# Patient Record
Sex: Male | Born: 1950
Health system: Southern US, Community
[De-identification: ages and names within clinical notes are randomized; demographics above are authoritative.]

## PROBLEM LIST (undated history)

## (undated) DIAGNOSIS — M549 Dorsalgia, unspecified: Secondary | ICD-10-CM

## (undated) DIAGNOSIS — E119 Type 2 diabetes mellitus without complications: Secondary | ICD-10-CM

## (undated) DIAGNOSIS — H9190 Unspecified hearing loss, unspecified ear: Secondary | ICD-10-CM

## (undated) DIAGNOSIS — I252 Old myocardial infarction: Secondary | ICD-10-CM

## (undated) DIAGNOSIS — N419 Inflammatory disease of prostate, unspecified: Secondary | ICD-10-CM

## (undated) DIAGNOSIS — R972 Elevated prostate specific antigen [PSA]: Secondary | ICD-10-CM

## (undated) DIAGNOSIS — I4892 Unspecified atrial flutter: Secondary | ICD-10-CM

## (undated) DIAGNOSIS — M1990 Unspecified osteoarthritis, unspecified site: Secondary | ICD-10-CM

## (undated) DIAGNOSIS — N289 Disorder of kidney and ureter, unspecified: Secondary | ICD-10-CM

## (undated) DIAGNOSIS — K8020 Calculus of gallbladder without cholecystitis without obstruction: Secondary | ICD-10-CM

## (undated) DIAGNOSIS — I10 Essential (primary) hypertension: Secondary | ICD-10-CM

## (undated) DIAGNOSIS — I4891 Unspecified atrial fibrillation: Secondary | ICD-10-CM

## (undated) DIAGNOSIS — T4145XA Adverse effect of unspecified anesthetic, initial encounter: Secondary | ICD-10-CM

## (undated) DIAGNOSIS — E785 Hyperlipidemia, unspecified: Secondary | ICD-10-CM

## (undated) DIAGNOSIS — I2510 Atherosclerotic heart disease of native coronary artery without angina pectoris: Secondary | ICD-10-CM

## (undated) DIAGNOSIS — T8859XA Other complications of anesthesia, initial encounter: Secondary | ICD-10-CM

## (undated) HISTORY — DX: Unspecified osteoarthritis, unspecified site: M19.90

## (undated) HISTORY — DX: Unspecified atrial fibrillation: I48.91

## (undated) HISTORY — PX: OTHER SURGICAL HISTORY: SHX169

## (undated) HISTORY — DX: Inflammatory disease of prostate, unspecified: N41.9

## (undated) HISTORY — PX: TOOTH EXTRACTION: SHX859

## (undated) HISTORY — DX: Unspecified hearing loss, unspecified ear: H91.90

## (undated) HISTORY — DX: Disorder of kidney and ureter, unspecified: N28.9

## (undated) HISTORY — DX: Type 2 diabetes mellitus without complications: E11.9

## (undated) HISTORY — DX: Atherosclerotic heart disease of native coronary artery without angina pectoris: I25.10

## (undated) HISTORY — DX: Hyperlipidemia, unspecified: E78.5

## (undated) HISTORY — PX: EXTERNAL EAR SURGERY: SHX627

## (undated) HISTORY — PX: COLONOSCOPY: SHX5424

## (undated) HISTORY — DX: Elevated prostate specific antigen (PSA): R97.20

## (undated) HISTORY — DX: Dorsalgia, unspecified: M54.9

## (undated) HISTORY — DX: Essential (primary) hypertension: I10

## (undated) HISTORY — PX: VASECTOMY: SHX75

---

## 2005-02-06 ENCOUNTER — Ambulatory Visit: Payer: Self-pay | Admitting: Pulmonary Disease

## 2005-03-06 ENCOUNTER — Ambulatory Visit: Payer: Self-pay | Admitting: Pulmonary Disease

## 2005-04-28 ENCOUNTER — Ambulatory Visit: Payer: Self-pay | Admitting: Pulmonary Disease

## 2005-05-15 ENCOUNTER — Ambulatory Visit: Payer: Self-pay | Admitting: Pulmonary Disease

## 2005-05-29 ENCOUNTER — Ambulatory Visit: Payer: Self-pay | Admitting: Pulmonary Disease

## 2007-11-04 ENCOUNTER — Ambulatory Visit: Payer: Self-pay | Admitting: *Deleted

## 2007-11-05 ENCOUNTER — Inpatient Hospital Stay (HOSPITAL_COMMUNITY): Admission: EM | Admit: 2007-11-05 | Discharge: 2007-11-08 | Payer: Self-pay | Admitting: Emergency Medicine

## 2007-11-07 HISTORY — PX: CARDIAC CATHETERIZATION: SHX172

## 2007-11-24 ENCOUNTER — Ambulatory Visit: Payer: Self-pay | Admitting: Cardiology

## 2007-12-30 ENCOUNTER — Ambulatory Visit: Payer: Self-pay | Admitting: Cardiology

## 2007-12-30 LAB — CONVERTED CEMR LAB
Albumin: 4 g/dL (ref 3.5–5.2)
Total Bilirubin: 0.7 mg/dL (ref 0.3–1.2)
Total CHOL/HDL Ratio: 5.8
VLDL: 18 mg/dL (ref 0–40)

## 2008-02-17 ENCOUNTER — Ambulatory Visit: Payer: Self-pay | Admitting: Cardiology

## 2008-04-06 ENCOUNTER — Ambulatory Visit: Payer: Self-pay | Admitting: Cardiology

## 2008-04-06 LAB — CONVERTED CEMR LAB
ALT: 21 units/L (ref 0–53)
AST: 25 units/L (ref 0–37)
Albumin: 4.1 g/dL (ref 3.5–5.2)
Alkaline Phosphatase: 24 units/L — ABNORMAL LOW (ref 39–117)
Bilirubin, Direct: 0.1 mg/dL (ref 0.0–0.3)
Total Bilirubin: 0.9 mg/dL (ref 0.3–1.2)

## 2008-05-21 ENCOUNTER — Ambulatory Visit: Payer: Self-pay | Admitting: Cardiology

## 2008-05-25 ENCOUNTER — Ambulatory Visit: Payer: Self-pay | Admitting: Cardiology

## 2008-05-25 LAB — CONVERTED CEMR LAB
ALT: 21 units/L (ref 0–53)
AST: 27 units/L (ref 0–37)
Calcium: 9.6 mg/dL (ref 8.4–10.5)
LDL Cholesterol: 61 mg/dL (ref 0–99)
Potassium: 5.3 meq/L — ABNORMAL HIGH (ref 3.5–5.1)
Total CHOL/HDL Ratio: 4.2
Total Protein: 6.6 g/dL (ref 6.0–8.3)
Triglycerides: 48 mg/dL (ref 0–149)
VLDL: 10 mg/dL (ref 0–40)

## 2008-06-08 ENCOUNTER — Ambulatory Visit: Payer: Self-pay | Admitting: Cardiology

## 2008-06-08 LAB — CONVERTED CEMR LAB
BUN: 41 mg/dL — ABNORMAL HIGH (ref 6–23)
Calcium: 9.4 mg/dL (ref 8.4–10.5)
Chloride: 109 meq/L (ref 96–112)
Creatinine, Ser: 1.5 mg/dL (ref 0.4–1.5)
GFR calc Af Amer: 62 mL/min
GFR calc non Af Amer: 51 mL/min
Potassium: 5.1 meq/L (ref 3.5–5.1)
Sodium: 141 meq/L (ref 135–145)

## 2008-07-13 ENCOUNTER — Ambulatory Visit: Payer: Self-pay | Admitting: Cardiology

## 2008-09-28 DIAGNOSIS — I252 Old myocardial infarction: Secondary | ICD-10-CM

## 2008-09-28 HISTORY — DX: Old myocardial infarction: I25.2

## 2008-11-08 ENCOUNTER — Ambulatory Visit: Payer: Self-pay | Admitting: Cardiology

## 2008-11-12 ENCOUNTER — Encounter: Payer: Self-pay | Admitting: Cardiology

## 2008-11-12 LAB — CONVERTED CEMR LAB
ALT: 28 units/L (ref 0–53)
AST: 39 units/L — ABNORMAL HIGH (ref 0–37)
CO2: 27 meq/L (ref 19–32)
Calcium: 9.8 mg/dL (ref 8.4–10.5)
Cholesterol: 119 mg/dL (ref 0–200)
GFR calc Af Amer: 62 mL/min
Total CHOL/HDL Ratio: 4
VLDL: 19 mg/dL (ref 0–40)

## 2009-05-22 ENCOUNTER — Encounter (INDEPENDENT_AMBULATORY_CARE_PROVIDER_SITE_OTHER): Payer: Self-pay | Admitting: *Deleted

## 2009-08-16 DIAGNOSIS — E119 Type 2 diabetes mellitus without complications: Secondary | ICD-10-CM | POA: Insufficient documentation

## 2009-08-16 DIAGNOSIS — I2583 Coronary atherosclerosis due to lipid rich plaque: Secondary | ICD-10-CM

## 2009-08-16 DIAGNOSIS — N259 Disorder resulting from impaired renal tubular function, unspecified: Secondary | ICD-10-CM | POA: Insufficient documentation

## 2009-08-16 DIAGNOSIS — I2510 Atherosclerotic heart disease of native coronary artery without angina pectoris: Secondary | ICD-10-CM | POA: Insufficient documentation

## 2009-08-16 DIAGNOSIS — E785 Hyperlipidemia, unspecified: Secondary | ICD-10-CM | POA: Insufficient documentation

## 2009-08-16 DIAGNOSIS — I10 Essential (primary) hypertension: Secondary | ICD-10-CM | POA: Insufficient documentation

## 2009-08-19 ENCOUNTER — Ambulatory Visit: Payer: Self-pay | Admitting: Cardiology

## 2009-08-23 ENCOUNTER — Telehealth (INDEPENDENT_AMBULATORY_CARE_PROVIDER_SITE_OTHER): Payer: Self-pay | Admitting: *Deleted

## 2009-09-09 ENCOUNTER — Ambulatory Visit: Payer: Self-pay | Admitting: Cardiology

## 2009-09-09 ENCOUNTER — Encounter (INDEPENDENT_AMBULATORY_CARE_PROVIDER_SITE_OTHER): Payer: Self-pay | Admitting: *Deleted

## 2009-09-09 DIAGNOSIS — E875 Hyperkalemia: Secondary | ICD-10-CM | POA: Insufficient documentation

## 2009-09-11 LAB — CONVERTED CEMR LAB
Calcium: 9.1 mg/dL (ref 8.4–10.5)
GFR calc non Af Amer: 59.7 mL/min (ref >60)

## 2009-09-13 LAB — CONVERTED CEMR LAB
Bilirubin Urine: NEGATIVE
Creatinine,U: 118.8 mg/dL
Leukocytes, UA: NEGATIVE
Specific Gravity, Urine: >=1.03 (ref 1.000–1.030)
Total Protein, Urine: NEGATIVE mg/dL
Urine Glucose: NEGATIVE mg/dL
Urobilinogen, UA: 0.2 (ref 0.0–1.0)

## 2009-11-01 ENCOUNTER — Ambulatory Visit: Payer: Self-pay | Admitting: Internal Medicine

## 2009-11-01 DIAGNOSIS — K644 Residual hemorrhoidal skin tags: Secondary | ICD-10-CM | POA: Insufficient documentation

## 2009-11-26 ENCOUNTER — Ambulatory Visit: Payer: Self-pay | Admitting: Internal Medicine

## 2009-11-26 ENCOUNTER — Telehealth: Payer: Self-pay | Admitting: Internal Medicine

## 2009-11-26 ENCOUNTER — Telehealth (INDEPENDENT_AMBULATORY_CARE_PROVIDER_SITE_OTHER): Payer: Self-pay | Admitting: *Deleted

## 2010-04-21 ENCOUNTER — Telehealth: Payer: Self-pay | Admitting: Cardiology

## 2010-04-21 DIAGNOSIS — R002 Palpitations: Secondary | ICD-10-CM | POA: Insufficient documentation

## 2010-04-23 ENCOUNTER — Ambulatory Visit: Payer: Self-pay | Admitting: Cardiology

## 2010-04-28 ENCOUNTER — Emergency Department (HOSPITAL_COMMUNITY): Admission: EM | Admit: 2010-04-28 | Discharge: 2010-04-29 | Payer: Self-pay | Admitting: Emergency Medicine

## 2010-04-28 ENCOUNTER — Telehealth (INDEPENDENT_AMBULATORY_CARE_PROVIDER_SITE_OTHER): Payer: Self-pay | Admitting: *Deleted

## 2010-04-28 ENCOUNTER — Telehealth: Payer: Self-pay | Admitting: Nurse Practitioner

## 2010-04-29 ENCOUNTER — Ambulatory Visit: Payer: Self-pay | Admitting: Cardiology

## 2010-04-29 DIAGNOSIS — I480 Paroxysmal atrial fibrillation: Secondary | ICD-10-CM | POA: Insufficient documentation

## 2010-04-30 LAB — CONVERTED CEMR LAB
Albumin: 4.6 g/dL (ref 3.5–5.2)
Cholesterol: 119 mg/dL (ref 0–200)
Total CHOL/HDL Ratio: 5

## 2010-05-02 ENCOUNTER — Inpatient Hospital Stay (HOSPITAL_COMMUNITY): Admission: AD | Admit: 2010-05-02 | Discharge: 2010-05-06 | Payer: Self-pay | Admitting: Internal Medicine

## 2010-05-02 ENCOUNTER — Telehealth: Payer: Self-pay | Admitting: Cardiology

## 2010-05-02 ENCOUNTER — Encounter: Payer: Self-pay | Admitting: Cardiology

## 2010-05-02 ENCOUNTER — Ambulatory Visit: Payer: Self-pay | Admitting: Internal Medicine

## 2010-05-07 ENCOUNTER — Telehealth (INDEPENDENT_AMBULATORY_CARE_PROVIDER_SITE_OTHER): Payer: Self-pay | Admitting: *Deleted

## 2010-06-05 ENCOUNTER — Ambulatory Visit: Payer: Self-pay | Admitting: Internal Medicine

## 2010-07-21 ENCOUNTER — Ambulatory Visit: Payer: Self-pay | Admitting: Cardiology

## 2010-08-27 ENCOUNTER — Encounter: Payer: Self-pay | Admitting: Gastroenterology

## 2010-10-01 ENCOUNTER — Encounter: Payer: Self-pay | Admitting: Internal Medicine

## 2010-10-01 ENCOUNTER — Ambulatory Visit
Admission: RE | Admit: 2010-10-01 | Discharge: 2010-10-01 | Payer: Self-pay | Source: Home / Self Care | Attending: Internal Medicine | Admitting: Internal Medicine

## 2010-10-26 LAB — CONVERTED CEMR LAB
ALT: 31 units/L (ref 0–53)
AST: 42 units/L — ABNORMAL HIGH (ref 0–37)
Albumin: 4.5 g/dL (ref 3.5–5.2)
Bilirubin, Direct: 0.1 mg/dL (ref 0.0–0.3)
CO2: 27 meq/L (ref 19–32)
Chloride: 108 meq/L (ref 96–112)
Cholesterol: 114 mg/dL (ref 0–200)
Creatinine, Ser: 1.5 mg/dL (ref 0.4–1.5)
GFR calc non Af Amer: 50.63 mL/min (ref >60)
HDL: 20.5 mg/dL — ABNORMAL LOW (ref >39.00)
Potassium: 5.2 meq/L — ABNORMAL HIGH (ref 3.5–5.1)
Sodium: 142 meq/L (ref 135–145)
Total Bilirubin: 1 mg/dL (ref 0.3–1.2)
Total CHOL/HDL Ratio: 6
Triglycerides: 84 mg/dL (ref 0.0–149.0)

## 2010-10-30 NOTE — Progress Notes (Signed)
   Phone Note Outgoing Call   Call placed by: Deliah Goody, RN,  April 28, 2010 1:45 PM Summary of Call: event monitor reports atrial fib/flutter. spoke with pt wife, pt will come to the office tomorrow to be seen. he understands to go to the ER if irregular heart beat does not go away. Initial call taken by: Deliah Goody, RN,  April 28, 2010 1:47 PM

## 2010-10-30 NOTE — Letter (Signed)
Summary: Diabetic Instructions   Gastroenterology  [address] [place], [place] [address]   Phone: [phone]  Fax: [phone]    Joshua [name] [birth year] MRN: [id number]   x   ORAL DIABETIC MEDICATION INSTRUCTIONS  The day before your procedure:   Take your diabetic pill as you do normally  The day of your procedure:   Do not take your diabetic pill    We will check your blood sugar levels during the admission process and again in Recovery before discharging you home  ________________________________________________________________________

## 2010-10-30 NOTE — Progress Notes (Signed)
   Certification Of Physician Or Practitioner [facility] ) recieved sent to [facility] for completion. [facility] [place]  May 07, 2010 8:27 AM

## 2010-10-30 NOTE — Assessment & Plan Note (Signed)
Summary: SCREENING colonoscopy on Plavix / hemorrhoids   History of Present Illness Visit Type: consult Primary GI MD: Yancey Flemings MD Requesting Provider: Jarome Matin, MD Chief Complaint: hemorrhoids, screening colonoscopy on Plavix History of Present Illness:   60 year old white male with multiple medical problems including morbid obesity, hypertension, hyperlipidemia, diabetes mellitus, coronary artery disease with coronary artery stents, the need for chronic aspirin/Plavix therapy, and renal insufficiency. He presents today regarding health maintenance and screening colonoscopy. Also, concerns about hemorrhoids. No prior GI history or GI problems. No prior GI procedures. His GI review of systems is only remarkable for anal discomfort and occasional bleeding which he attributes to a hemorrhoid. He has tried over-the-counter remedies such as Tucks pads without improvement. Marland Kitchen His bowel habits are normal.. No family history of colon cancer. His multiple significant chronic medical problems are stable. He does have drug-eluting stents placed in his heart arteries about 2 years ago. I reviewed Dr. Ludwig Clarks recent office note from November. Looks like they're planning on discontinuing Plavix later this year.. Review of outside laboratories from December 2010 reveals normal metabolic panel.   GI Review of Systems      Denies abdominal pain, acid reflux, belching, bloating, chest pain, dysphagia with liquids, dysphagia with solids, heartburn, loss of appetite, nausea, vomiting, vomiting blood, weight loss, and  weight gain.      Reports hemorrhoids, rectal bleeding, and  rectal pain.     Denies anal fissure, black tarry stools, change in bowel habit, constipation, diarrhea, diverticulosis, fecal incontinence, heme positive stool, irritable bowel syndrome, jaundice, light color stool, and  liver problems.    Current Medications (verified): 1)  Atenolol 50 Mg Tabs (Atenolol) .Marland Kitchen.. 1 Tab By Mouth  Once Daily 2)  Quinapril Hcl 40 Mg Tabs (Quinapril Hcl) .Marland Kitchen.. 1 Tab By Mouth Once Daily 3)  Plavix 75 Mg Tabs (Clopidogrel Bisulfate) .Marland Kitchen.. 1 Tab By Mouth Once Daily 4)  Actos 45 Mg Tabs (Pioglitazone Hcl) .Marland Kitchen.. 1 Tab By Mouth Once Daily 5)  Fenofibrate Micronized 200 Mg Caps (Fenofibrate Micronized) .Marland Kitchen.. 1 Tab By Mouth Once Daily 6)  Lipitor 40 Mg Tabs (Atorvastatin Calcium) .Marland Kitchen.. 1 Tab By Mouth Once Daily 7)  Aspirin Adult Low Strength 81 Mg Tbec (Aspirin) .Marland Kitchen.. 1 Tab By Mouth Qd 8)  Centrum Silver  Tabs (Multiple Vitamins-Minerals) .Marland Kitchen.. 1 Tab By Mouth Once Daily 9)  Nitrostat 0.4 Mg Subl (Nitroglycerin) .Marland Kitchen.. 1 Tablet Under Tongue At Onset of Chest Pain; You May Repeat Every 5 Minutes For Up To 3 Doses.  Allergies (verified): No Known Drug Allergies  Past History:  Past Medical History: Reviewed history from 08/16/2009 and no changes required. Current Problems:  CAD (ICD-414.00) HYPERLIPIDEMIA (ICD-272.4) HYPERTENSION (ICD-401.9) DM (ICD-250.00) RENAL INSUFFICIENCY (ICD-588.9)  Past Surgical History: Reviewed history from 08/16/2009 and no changes required. Successful two-vessel percutaneous coronary intervention of  the left mid left circumflex and proximal left anterior descending  artery using Cypher drug-eluting stents in each vessel.  [name]. [name], MD   Electronically Signed  MDC/MEDQ  D:  11/07/2007  T:  11/08/2007  Job:  [id number]   Family History:  The patient's Mother is deceased at age 17 with hypertension,   gout and Alzheimer's.  His father died of CHF in his early 73s, but had  multiple MIs prior to this possibly as early as his 49s.  He had a  brother, who is deceased, with substance abuse, heart problems and  cancer.  No FH of Colon Cancer:  Social History:  The patient lives in [place] with his wife.  He is   Retail banker.  He is married with two children.  No smoking   history.  No alcohol history.  No drugs.  No herbal medication.   Daily Caffeine Use x 2     Review of Systems       entire non-GI review of systems reviewed and reported as negative  Vital Signs:  Patient profile:   60 year old male Height:      68 inches Weight:      263 pounds BMI:     40.13 Pulse rate:   68 / minute Pulse rhythm:   regular BP sitting:   120 / 84  (left arm) Cuff size:   regular  Vitals Entered By: Francee Piccolo CMA Duncan Dull) (November 01, 2009 10:09 AM)  Physical Exam  General:  Well developed, well nourished, no acute distress. Head:  Normocephalic and atraumatic.Marland Kitchen Hypervascularity of the cheeks bilaterally Eyes:  PERRLA, no icterus. Nose:  No deformity, discharge,  or lesions. Mouth:  No deformity or lesions, dentition normal. Neck:  Supple; no masses or thyromegaly. Lungs:  Clear throughout to auscultation. Heart:  Regular rate and rhythm; no murmurs, rubs,  or bruits. Abdomen:  Soft, obese,nontender and nondistended. No masses, hepatosplenomegaly or hernias noted. Normal bowel sounds. Rectal:  deferred until colonoscopy Msk:  Symmetrical with no gross deformities. Normal posture. Pulses:  Normal pulses noted. Extremities:  No clubbing, cyanosis, edema or deformities noted. Neurologic:  Alert and  oriented x4;  grossly normal neurologically. Skin:  Intact without significant lesions or rashes. Psych:  Alert and cooperative. Normal mood and affect.   Impression & Recommendations:  Problem # 1:  HEMORRHOIDS-EXTERNAL (ICD-455.3) rectal discomfort and minor intermittent bleeding presumably due to external hemorrhoid. We deferred evaluation until his colonoscopy.. Thereafter more definitive treatment recom.mendations.  Problem # 2:  SCREENING COLORECTAL-CANCER (ICD-V76.51) the patient is an appropriate candidate for screening colonoscopy without contraindication. He is however HIGH RISK due to his multiple comorbidities. As well, the need to address the issue of Plavix therapy. I discussed with him the nature of colonoscopy as well as its  risks, benefits, and alternatives in detail. We also discussed the pros and cons of discontinuing Plavix therapy and the implications. It was his preference to come off of Plavix therapy if okay with Dr. Jens Som. We would take him off of Plavix 5 days prior to the examination and keep him off of the drug as much as 2 weeks postprocedure depending upon if significant therapy needed. However, he would remain on aspirin daily. I sent correspondence to Dr. Jens Som for his review on this matter. Additionally, the patient was prescribed Movi prep. He was instructed on its use  Problem # 3:  CAD (ICD-414.00) CAD with drug-eluting stents on aspirin and Plavix therapy. Impact on the procedure as outlined above.  Patient Instructions: 1)  Colonoscopy [facility] 11/26/09 10:30 am 2)  Colonoscopy brochure given.  3)  Movi prep instructions given to patient. 4)  Movi prep Rx. sent to pharmacy. 5)  Hold Plavix x 5 days   Letter sent to Dr. Jens Som for ok. Will Contact patient with response. 6)  Stay on Aspirin. 7)  The medication list was reviewed and reconciled.  All changed / newly prescribed medications were explained.  A complete medication list was provided to the patient / caregiver. 8)  Copy: Dr. Jarome Matin, Dr. Olga Millers Prescriptions: MOVIPREP 100 GM  SOLR (PEG-KCL-NACL-NASULF-NA ASC-C) As per  prep instructions.  #1 x 0   Entered by:   Milford Cage NCMA   Authorized by:   Hilarie Fredrickson MD   Signed by:   Milford Cage NCMA on 11/01/2009   Method used:   [facility] [phone][facility] (retail[facility]       [address]       [place], [place]  [address]       Ph: [phone] or [phone]       Fax: [phone]   RxID:   [id number]

## 2010-10-30 NOTE — Miscellaneous (Signed)
Summary: Orders Update  Clinical Lists Changes  Orders: Added new Test order of TLB-Hepatic/Liver Function Pnl (80076-HEPATIC) - Signed Added new Test order of TLB-Lipid Panel (80061-LIPID) - Signed 

## 2010-10-30 NOTE — Letter (Signed)
Summary: [facility]  [facility]   Imported By: Lester Gallaway 09/11/2010 11:53:32  _____________________________________________________________________  External Attachment:    Type:   Image     Comment:   External Document

## 2010-10-30 NOTE — Progress Notes (Signed)
   Phone Note Call from Patient   Summary of Call: re-sent prescription for anamantle cream and notified patient to pick up at [facility] Initial call taken by: Rylan Bernard S. Meko Masterson, RN

## 2010-10-30 NOTE — Progress Notes (Signed)
Summary: Cardiology Phone Note - Rapid AF   Phone Note Other Incoming   Caller: [employer] Summary of Call: received call from [employer] technician that Joshua [name] has been in rapid a. fib for past 1+ hrs.  technician reports having spoken to pts wife who advised that they were calling ems.  i called pts home and there was no answer or opportunity to leave a message. Initial call taken by: Creig Hines, ANP-BC,  April 28, 2010 6:50 PM

## 2010-10-30 NOTE — Letter (Signed)
Summary: [facility] Instructions  [facility]  [address] [place], [place] [address]   Phone: [phone]  Fax: [phone]       Joshua [name]    05-22-1951    MRN: [id number]        Procedure Day /Date:TUESDAY  11/26/09     Arrival Time:9:30 AM     Procedure Time:10:30 AM     Location of Procedure:                    X  [facility] (4th Floor)                        PREPARATION FOR COLONOSCOPY WITH MOVIPREP   Starting 5 days prior to your procedure 11/22/09 do not eat nuts, seeds, popcorn, corn, beans, peas,  salads, or any raw vegetables.  Do not take any fiber supplements (e.g. Metamucil, Citrucel, and Benefiber).  THE DAY BEFORE YOUR PROCEDURE         DATE: 11/25/09  GMW:NUUVOZ  1.  Drink clear liquids the entire day-NO SOLID FOOD  2.  Do not drink anything colored red or purple.  Avoid juices with pulp.  No orange juice.  3.  Drink at least 64 oz. (8 glasses) of fluid/clear liquids during the day to prevent dehydration and help the prep work efficiently.  CLEAR LIQUIDS INCLUDE: Water Jello Ice Popsicles Tea (sugar ok, no milk/cream) Powdered fruit flavored drinks Coffee (sugar ok, no milk/cream) Gatorade Juice: apple, white grape, white cranberry  Lemonade Clear bullion, consomm, broth Carbonated beverages (any kind) Strained chicken noodle soup Hard Candy                             4.  In the morning, mix first dose of MoviPrep solution:    Empty 1 Pouch A and 1 Pouch B into the disposable container    Add lukewarm drinking water to the top line of the container. Mix to dissolve    Refrigerate (mixed solution should be used within 24 hrs)  5.  Begin drinking the prep at 5:00 p.m. The MoviPrep container is divided by 4 marks.   Every 15 minutes drink the solution down to the next mark (approximately 8 oz) until the full liter is complete.   6.  Follow completed prep with 16 oz of clear liquid of your choice (Nothing red or  purple).  Continue to drink clear liquids until bedtime.  7.  Before going to bed, mix second dose of MoviPrep solution:    Empty 1 Pouch A and 1 Pouch B into the disposable container    Add lukewarm drinking water to the top line of the container. Mix to dissolve    Refrigerate  THE DAY OF YOUR PROCEDURE      DATE: 11/26/09 DAY: Jake Shark  Beginning at5:30 a.m. (5 hours before procedure):         1. Every 15 minutes, drink the solution down to the next mark (approx 8 oz) until the full liter is complete.  2. Follow completed prep with 16 oz. of clear liquid of your choice.    3. You may drink clear liquids until8:30 AM (2 HOURS BEFORE PROCEDURE).   MEDICATION INSTRUCTIONS  Unless otherwise instructed, you should take regular prescription medications with a small sip of water   as early as possible the morning of your procedure.  Diabetic patients -  see separate instructions.  Stop taking Plavix   11/22/09  (5days before procedure).            OTHER INSTRUCTIONS  You will need a responsible adult at least 62 years of age to accompany you and drive you home.   This person must remain in the waiting room during your procedure.  Wear loose fitting clothing that is easily removed.  Leave jewelry and other valuables at home.  However, you may wish to bring a book to read or  an iPod/MP3 player to listen to music as you wait for your procedure to start.  Remove all body piercing jewelry and leave at home.  Total time from sign-in until discharge is approximately 2-3 hours.  You should go home directly after your procedure and rest.  You can resume normal activities the  day after your procedure.  The day of your procedure you should not:   Drive   Make legal decisions   Operate machinery   Drink alcohol   Return to work  You will receive specific instructions about eating, activities and medications before you leave.    The above instructions have been  reviewed and explained to me by   _______________________    I fully understand and can verbalize these instructions _____________________________ Date _________

## 2010-10-30 NOTE — Progress Notes (Signed)
Summary: Medication  Medications Added LIDOCAINE-HYDROCORTISONE ACE 3-0.5 % CREA (LIDOCAINE-HYDROCORTISONE ACE) 2-3 times daily as needed; dispense large quantity with 6 refills      Allergies Added: NKDA Phone Note Call from Patient Call back at Home Phone [phone]   Caller: Patient Call For: Dr. Marina Goodell Reason for Call: Talk to Nurse Summary of Call: Pt went to pharmacy and the rx for Anamantle Cream was not at the pharmacy Initial call taken by: Karna Christmas,  November 26, 2009 3:14 PM    New/Updated Medications: LIDOCAINE-HYDROCORTISONE ACE 3-0.5 % CREA (LIDOCAINE-HYDROCORTISONE ACE) 2-3 times daily as needed; dispense large quantity with 6 refills Prescriptions: LIDOCAINE-HYDROCORTISONE ACE 3-0.5 % CREA (LIDOCAINE-HYDROCORTISONE ACE) 2-3 times daily as needed; dispense large quantity with 6 refills  #8 oz x 6   Entered by:   Quincy Carnes RN   Authorized by:   Hilarie Fredrickson MD   Signed by:   Quincy Carnes RN on 11/26/2009   Method used:   [facility] [phone]* (retail)       [address]       [place], [place]  [address]       Ph: [phone] or [phone]       Fax: [phone]   RxID:   [id number]

## 2010-10-30 NOTE — Procedures (Signed)
Summary: Summary Report  Summary Report   Imported By: Erle Crocker 07/09/2010 14:40:13  _____________________________________________________________________  External Attachment:    Type:   Image     Comment:   External Document

## 2010-10-30 NOTE — Progress Notes (Signed)
Summary: Irregular heart beats   Phone Note Call from Patient Call back at [phone]   Caller: Patient Summary of Call: Pt having irregular heart beats Initial call taken by: Judie Grieve,  April 21, 2010 9:09 AM  Follow-up for Phone Call        spoke with pt, he has had two episodes of his heart racing at rest. these episodes last less than one minute, he breaks out in a cold sweat and has an aching in his jaw. he denies any other chest pain or other symptoms. he did not have this type of symptoms with his stent. he works outside and walks alot and has no symptoms with exerction but he states his heart is not beating regular on a daily basis, feels like it is skipping, but has only had two episode of racing. he also reports if it occurs while lying down if he sits up it stops. he feel fine at present. will foward for dr Jens Som review Deliah Goody, RN  April 21, 2010 11:24 AM    Additional Follow-up for Phone Call Additional follow up Details #1::        schedule cardionet and f/u ov Ferman Hamming, MD, [facility]  April 21, 2010 2:45 PM  pt aware, monitor ordered and office visit scheduled Deliah Goody, RN  April 21, 2010 3:18 PM   New Problems: PALPITATIONS (ICD-785.1)   New Problems: PALPITATIONS (ICD-785.1)

## 2010-10-30 NOTE — Letter (Signed)
Summary: Anticoagulation Modification Letter  [facility]  [address] [place], [place] [address]   Phone: [phone]  Fax: [phone]         November 01, 2009  Re:    Joshua [name] DOB:    02-23-1951 MRN:    [id number]    Dear Dr. Jens Som:  We have scheduled the above patient for a colonoscopy procedure. Our records show that  he is on anticoagulation therapy. Please advise as to how long the patient may come off their therapy of Plavix prior to the scheduled procedure(s) on 11/26/09.   Please fax back/or route the completed form to [facility] at [phone].  Thank you for your help with this matter.  Sincerely,    Wilhemina Bonito. Marina Goodell, MD  Physician Recommendation:  Hold Plavix 5days prior ________________  Other:     Stay on Aspirin  Appended Document: Anticoagulation Modification Letter Faxed to Dr. Jens Som.  Appended Document: Anticoagulation Modification Letter per dr Jens Som, pt can hold plavix 5 days prior to procedure. we would like the pt to continue on aspirin.  Appended Document: Anticoagulation Modification Letter Called patient and notified wife that it was ok to hold Plavix x 5 days and Stay on ASA per Dr. Jens Som.

## 2010-10-30 NOTE — Assessment & Plan Note (Signed)
Summary: rov/new atrial fib/flutter/dm  Medications Added ATENOLOL 50 MG TABS (ATENOLOL) 1 tab by mouth two times a day WARFARIN SODIUM 5 MG TABS (WARFARIN SODIUM) Use as directed by Anticoagulation Clinic PRADAXA 150 MG CAPS (DABIGATRAN ETEXILATE MESYLATE) ONE TABLET by mouth two times a day ACTOS 45 MG TABS (PIOGLITAZONE HCL) 1 tab by mouth once monday, wednesday and friday      Allergies Added: NKDA  History of Present Illness: [name] is a pleasant gentleman who has a history of diabetes, hypertension, hyperlipidemia, and coronary artery disease.  In February 2009, he had PCI of his circumflex and proximal LAD using Cypher drug- eluting stents. I last saw him in November of 2010. He recently contacted the office with palpitations. A monitor was placed and reveals paroxysmal atrial fibrillation.Seen in the emergency room last evening. One set of cardiac markers were normal. Hemoglobin and renal function were normal. Chest x-ray showed no acute disease. Patient states that his symptoms have occurred over the past 2 weeks. He will feel a sudden onset of palpitations. There is no associated shortness of breath, chest pain or presyncope. They occur more frequently when lying flat and improved or break when sitting up. He otherwise has not had dyspnea on exertion, or exertional chest pain.  Current Medications (verified): 1)  Atenolol 50 Mg Tabs (Atenolol) .Marland Kitchen.. 1 Tab By Mouth Once Daily 2)  Quinapril Hcl 40 Mg Tabs (Quinapril Hcl) .Marland Kitchen.. 1 Tab By Mouth Once Daily 3)  Plavix 75 Mg Tabs (Clopidogrel Bisulfate) .Marland Kitchen.. 1 Tab By Mouth Once Daily 4)  Actos 45 Mg Tabs (Pioglitazone Hcl) .Marland Kitchen.. 1 Tab By Mouth Once Monday, Wednesday and Friday 5)  Fenofibrate Micronized 200 Mg Caps (Fenofibrate Micronized) .Marland Kitchen.. 1 Tab By Mouth Once Daily 6)  Lipitor 40 Mg Tabs (Atorvastatin Calcium) .Marland Kitchen.. 1 Tab By Mouth Once Daily 7)  Aspirin Adult Low Strength 81 Mg Tbec (Aspirin) .Marland Kitchen.. 1 Tab By Mouth Qd 8)  Nitrostat 0.4 Mg  Subl (Nitroglycerin) .Marland Kitchen.. 1 Tablet Under Tongue At Onset of Chest Pain; You May Repeat Every 5 Minutes For Up To 3 Doses.  Allergies (verified): No Known Drug Allergies  Past History:  Past Medical History: CAD (ICD-414.00) HYPERLIPIDEMIA (ICD-272.4) HYPERTENSION (ICD-401.9) DM (ICD-250.00) RENAL INSUFFICIENCY (ICD-588.9)  Past Surgical History: Reviewed history from 08/16/2009 and no changes required. Successful two-vessel percutaneous coronary intervention of  the left mid left circumflex and proximal left anterior descending  artery using Cypher drug-eluting stents in each vessel.  [name]. [name], MD   Electronically Signed  MDC/MEDQ  D:  11/07/2007  T:  11/08/2007  Job:  [id number]   Social History: Reviewed history from 11/01/2009 and no changes required.  The patient lives in [place] with his wife.  He is   Retail banker.  He is married with two children.  No smoking   history.  No alcohol history.  No drugs.  No herbal medication.   Daily Caffeine Use x 2   Review of Systems       no fevers or chills, productive cough, hemoptysis, dysphasia, odynophagia, melena, hematochezia, dysuria, hematuria, rash, seizure activity, orthopnea, PND, pedal edema, claudication. Remaining systems are negative.   Vital Signs:  Patient profile:   60 year old male Weight:      258 pounds Pulse rate:   60 / minute Pulse rhythm:   irregular BP sitting:   128 / 76  (left arm) Cuff size:   large  Vitals Entered By: Deliah Goody, RN (April 29, 2010 7:35 AM)  Physical Exam  General:  Well-developed well-nourished in no acute distress.  Skin is warm and dry.  HEENT is normal.  Neck is supple. No thyromegaly.  Chest is clear to auscultation with normal expansion.  Cardiovascular exam is regular rate and rhythm.  Abdominal exam nontender or distended. No masses palpated. Extremities show no edema. neuro grossly intact    Impression & Recommendations:  Problem # 1:  ATRIAL  FIBRILLATION (ICD-427.31) Patient has newly diagnosed atrial fibrillation. He apparently had a TSH drawn in the emergency room last evening. I will await those results. Scheduled echocardiogram. Increase atenolol to 50 mg p.o. b.i.d. If he continues to have episodes despite above will add an antiarrhythmic. Embolic risk factors of diabetes, hypertension and coronary disease. Begin pradaxa 150 mg p.o. b.i.d. Note recent creatinine 1.34 with GFR of 54. His updated medication list for this problem includes:    Atenolol 50 Mg Tabs (Atenolol) .Marland Kitchen... 1 tab by mouth two times a day    Aspirin Adult Low Strength 81 Mg Tbec (Aspirin) .Marland Kitchen... 1 tab by mouth qd  Orders: Echocardiogram (Echo)  Problem # 2:  CAD (ICD-414.00) Continue aspirin. Given that we are beginning pradaxa we will discontinue Plavix. Continue statin and beta blocker. His updated medication list for this problem includes:    Atenolol 50 Mg Tabs (Atenolol) .Marland Kitchen... 1 tab by mouth two times a day    Quinapril Hcl 40 Mg Tabs (Quinapril hcl) .Marland Kitchen... 1 tab by mouth once daily    Aspirin Adult Low Strength 81 Mg Tbec (Aspirin) .Marland Kitchen... 1 tab by mouth qd    Nitrostat 0.4 Mg Subl (Nitroglycerin) .Marland Kitchen... 1 tablet under tongue at onset of chest pain; you may repeat every 5 minutes for up to 3 doses.  Problem # 3:  HYPERLIPIDEMIA (ICD-272.4)  Continue statin. Check lipids and liver. His updated medication list for this problem includes:    Fenofibrate Micronized 200 Mg Caps (Fenofibrate micronized) .Marland Kitchen... 1 tab by mouth once daily    Lipitor 40 Mg Tabs (Atorvastatin calcium) .Marland Kitchen... 1 tab by mouth once daily  Orders: TLB-Lipid Panel (80061-LIPID) TLB-Hepatic/Liver Function Pnl (80076-HEPATIC)  Problem # 4:  HYPERTENSION (ICD-401.9)  Blood pressure controlled on present medications. Will continue. His updated medication list for this problem includes:    Atenolol 50 Mg Tabs (Atenolol) .Marland Kitchen... 1 tab by mouth two times a day    Quinapril Hcl 40 Mg Tabs  (Quinapril hcl) .Marland Kitchen... 1 tab by mouth once daily    Aspirin Adult Low Strength 81 Mg Tbec (Aspirin) .Marland Kitchen... 1 tab by mouth qd  His updated medication list for this problem includes:    Atenolol 50 Mg Tabs (Atenolol) .Marland Kitchen... 1 tab by mouth two times a day    Quinapril Hcl 40 Mg Tabs (Quinapril hcl) .Marland Kitchen... 1 tab by mouth once daily    Aspirin Adult Low Strength 81 Mg Tbec (Aspirin) .Marland Kitchen... 1 tab by mouth qd  Problem # 5:  DM (ICD-250.00)  His updated medication list for this problem includes:    Quinapril Hcl 40 Mg Tabs (Quinapril hcl) .Marland Kitchen... 1 tab by mouth once daily    Actos 45 Mg Tabs (Pioglitazone hcl) .Marland Kitchen... 1 tab by mouth once monday, wednesday and friday    Aspirin Adult Low Strength 81 Mg Tbec (Aspirin) .Marland Kitchen... 1 tab by mouth qd  His updated medication list for this problem includes:    Quinapril Hcl 40 Mg Tabs (Quinapril hcl) .Marland Kitchen... 1 tab by mouth once daily  Actos 45 Mg Tabs (Pioglitazone hcl) .Marland Kitchen... 1 tab by mouth once monday, wednesday and friday    Aspirin Adult Low Strength 81 Mg Tbec (Aspirin) .Marland Kitchen... 1 tab by mouth qd  Patient Instructions: 1)  Your physician recommends that you schedule a follow-up appointment in: 05-19-10 as scheduled 2)  Your physician has recommended you make the following change in your medication: STOP PLAVIX 3)  INCREASE ATENOLOL 50MG  ONE TABLET TWICE DAILY 4)  START PRADAXA 150MG  ONE TABLET TWICE DAILY 5)  Your physician has requested that you have an echocardiogram.  Echocardiography is a painless test that uses sound waves to create images of your heart. It provides your doctor with information about the size and shape of your heart and how well your heart's chambers and valves are working.  This procedure takes approximately one hour. There are no restrictions for this procedure. Prescriptions: PRADAXA 150 MG CAPS (DABIGATRAN ETEXILATE MESYLATE) ONE TABLET by mouth two times a day  #60 x 12   Entered by:   Deliah Goody, RN   Authorized by:   Ferman Hamming, MD, [facility]   Signed by:   Deliah Goody, RN on 04/29/2010   Method used:   Electronically to        [facility] [phone]* (retail)       [address]       [place], [place]  [address]       Ph: [phone] or [phone]       Fax: [phone]   RxID:   [id number] NITROSTAT 0.4 MG SUBL (NITROGLYCERIN) 1 tablet under tongue at onset of chest pain; you may repeat every 5 minutes for up to 3 doses.  #25 Tablet x 11   Entered by:   Deliah Goody, RN   Authorized by:   Ferman Hamming, MD, [facility]   Signed by:   Deliah Goody, RN on 04/29/2010   Method used:   [facility] to        [facility] [phone]* (retail)       [address]       [place], [place]  [address]       Ph: [phone] or [phone]       Fax: [phone]   RxID:   [id number] ATENOLOL 50 MG TABS (ATENOLOL) 1 tab by mouth two times a day  #60 x 12   Entered by:   Deliah Goody, RN   Authorized by:   Ferman Hamming, MD, [facility]   Signed by:   Deliah Goody, RN on 04/29/2010   Method used:   [facility]ronically to        [facility] [phone]* (retail)       [address]       [place], [place]  [address]       Ph: [phone] or [phone]       Fax: [phone]   RxID:   [id number] WARFARIN SODIUM 5 MG TABS (WARFARIN SODIUM) Use as directed by Anticoagulation Clinic  #30 x 12   Entered by:   Deliah Goody, RN   Authorized by:   Ferman Hamming, MD, [facility]   Signed by:   Deliah Goody, RN on 04/29/2010   Method used:   Electronically to        [facility] #  [phone] (retail)       [address]       [place], [place]  [address]       Ph: [phone] or [phone]       Fax: [phone]   RxID:   [id number]

## 2010-10-30 NOTE — Procedures (Signed)
Summary: HOLD/[facility]  HOLD/[facility]   Imported By: Lester West Crossett 11/15/2009 09:04:23  _____________________________________________________________________  External Attachment:    Type:   Image     Comment:   External Document

## 2010-10-30 NOTE — Assessment & Plan Note (Signed)
Summary: eph/ appt is 12:15 / s/p ablation/ gd      Allergies Added: NKDA  History of Present Illness: Mr. Eudy returns today for followup.  He has a h/o DM, HTN, CAD, and atrial fib.  He was found to have recurrent SVT and would develop atrial fib after he had SVT.  He underwent EPS/RFA of AVNRT several weeks ago and returns today for followup.  He denies c/p, sob or palpitations.  He notes that his resting heart rate is slow but he denies syncope.  Current Medications (verified): 1)  Atenolol 50 Mg Tabs (Atenolol) .Marland Kitchen.. 1 Tab By Mouth Two Times A Day 2)  Quinapril Hcl 40 Mg Tabs (Quinapril Hcl) .Marland Kitchen.. 1 Tab By Mouth Once Daily 3)  Pradaxa 150 Mg Caps (Dabigatran Etexilate Mesylate) .... One Tablet By Mouth Two Times A Day 4)  Actos 45 Mg Tabs (Pioglitazone Hcl) .Marland Kitchen.. 1 Tab By Mouth Once Monday, Wednesday and Friday 5)  Fenofibrate Micronized 200 Mg Caps (Fenofibrate Micronized) .Marland Kitchen.. 1 Tab By Mouth Once Daily 6)  Lipitor 40 Mg Tabs (Atorvastatin Calcium) .Marland Kitchen.. 1 Tab By Mouth Once Daily 7)  Aspirin Adult Low Strength 81 Mg Tbec (Aspirin) .Marland Kitchen.. 1 Tab By Mouth Qd 8)  Nitrostat 0.4 Mg Subl (Nitroglycerin) .Marland Kitchen.. 1 Tablet Under Tongue At Onset of Chest Pain; You May Repeat Every 5 Minutes For Up To 3 Doses.  Allergies (verified): No Known Drug Allergies  Past History:  Past Medical History: Last updated: 04/29/2010 CAD (ICD-414.00) HYPERLIPIDEMIA (ICD-272.4) HYPERTENSION (ICD-401.9) DM (ICD-250.00) RENAL INSUFFICIENCY (ICD-588.9)  Past Surgical History: Last updated: 08/16/2009 Successful two-vessel percutaneous coronary intervention of  the left mid left circumflex and proximal left anterior descending  artery using Cypher drug-eluting stents in each vessel.  [name]. [name], MD   Electronically Signed  MDC/MEDQ  D:  11/07/2007  T:  11/08/2007  Job:  [id number]   Review of Systems  The patient denies chest pain, syncope, dyspnea on exertion, and peripheral edema.    Vital  Signs:  Patient profile:   60 year old male Height:      68 inches Weight:      260 pounds BMI:     39.68 Pulse rate:   48 / minute BP sitting:   120 / 70  (left arm)  Vitals Entered By: Laurance Flatten CMA (June 05, 2010 12:29 PM)  Physical Exam  General:  Well-developed well-nourished in no acute distress.  Skin is warm and dry.  HEENT is normal.  Neck is supple. No thyromegaly.  Chest is clear to auscultation with normal expansion.  Cardiovascular exam is regular bradycardia. Abdominal exam nontender or distended. No masses palpated. Extremities show no edema. neuro grossly intact    Impression & Recommendations:  Problem # 1:  ATRIAL FIBRILLATION (ICD-427.31) Since his SVT ablation, he has had no recurrent SVT or atrial fib and I wonder if his atrial fib is a consequence of his SVT.  I have recomended that he reduce his dose of Atenolol to a half tab twice daily and that he continue Pradaxa for now.  He may be able to come off of his pradaxa if he has no recurrent atrial fib. The following medications were removed from the medication list:    Coumadin 5 Mg Tabs (Warfarin sodium) .Marland Kitchen... Take one tablet by mouth on sunday thru wednesday, then as directed. His updated medication list for this problem includes:    Atenolol 50 Mg Tabs (Atenolol) .Marland Kitchen... 1 tab by mouth two  times a day    Aspirin Adult Low Strength 81 Mg Tbec (Aspirin) .Marland Kitchen... 1 tab by mouth qd  Problem # 2:  HYPERTENSION (ICD-401.9) His blood pressure is well controlled.  He will continue his meds as below with the reduction of atenolol. His updated medication list for this problem includes:    Atenolol 50 Mg Tabs (Atenolol) .Marland Kitchen... 1 tab by mouth two times a day    Quinapril Hcl 40 Mg Tabs (Quinapril hcl) .Marland Kitchen... 1 tab by mouth once daily    Aspirin Adult Low Strength 81 Mg Tbec (Aspirin) .Marland Kitchen... 1 tab by mouth qd  Patient Instructions: 1)  Your physician recommends that you continue on your current medications as  directed. Please refer to the Current Medication list given to you today. 2)  Your physician wants you to follow-up in:  4 months You will receive a reminder letter in the mail two months in advance. If you don't receive a letter, please call our office to schedule the follow-up appointment.

## 2010-10-30 NOTE — Procedures (Signed)
Summary: Colonoscopy  Patient: [name] Note: All result statuses are Final unless otherwise noted.  Tests: (1) Colonoscopy (COL)   COL Colonoscopy           DONE     [facility]     [address]     [place], [place]  [address]           COLONOSCOPY PROCEDURE REPORT           PATIENT:  Joshua [name], Joshua [name]  MR#:  [id number]     BIRTHDATE:  October 01, 1950, 58 yrs. old  GENDER:  male           ENDOSCOPIST:  Wilhemina Bonito. Eda Keys, MD     Referred by:  Jarome Matin, M.D.           PROCEDURE DATE:  11/26/2009     PROCEDURE:  Average-risk screening colonoscopy     G0121     ASA CLASS:  Class III     INDICATIONS:  Routine Risk Screening           MEDICATIONS:   Fentanyl 50 mcg IV, Versed 8 mg IV           DESCRIPTION OF PROCEDURE:   After the risks benefits and     alternatives of the procedure were thoroughly explained, informed     consent was obtained.  Digital rectal exam was performed and     revealed anal fissure.   The LB [id number] endoscope was     introduced through the anus and advanced to the cecum, which was     identified by both the appendix and ileocecal valve, without     limitations.Time to cecum = 2:21 min. The quality of the prep was     excellent, using MoviPrep.  The instrument was then slowly     withdrawn (time = 10:35 min) as the colon was fully examined.     <<PROCEDUREIMAGES>>           FINDINGS:  A diverticulum was found in the ascending colon.  This     was otherwise a normal examination of the colon.  No polyps or     cancers were seen.   Retroflexed views in the rectum revealed     internal hemorrhoids.    The scope was then withdrawn from the     patient and the procedure completed.           COMPLICATIONS:  None           ENDOSCOPIC IMPRESSION:     1) Diverticulum in the ascending colon     2) Otherwise normal examination     3) No polyps or cancers     4) Internal hemorrhoids     5) Anal fissure           RECOMMENDATIONS:     1)  Continue current colorectal screening recommendations for     "routine risk" patients with a repeat colonoscopy in 10 years.     2) Daily fiber supplement such as metamucil     3) Anamantle Cream 2-3 times daily as needed; dispense large     quantity with 6 refills           ______________________________     Wilhemina Bonito. Eda Keys, MD           CC:  Jarome Matin, MD; The Patient           n.  eSIGNED:   Wilhemina Bonito. Eda Keys at 11/26/2009 11:23 AM           Buren Kos, [id number]  Note: An exclamation mark (!) indicates a result that was not dispersed into the flowsheet. Document Creation Date: 11/26/2009 11:23 AM _______________________________________________________________________  (1) Order result status: Final Collection or observation date-time: 11/26/2009 11:15 Requested date-time:  Receipt date-time:  Reported date-time:  Referring Physician:   Ordering Physician: Fransico Setters [id number]) Specimen Source:  Source: Launa Grill Order Number: [id number] Lab site:   Appended Document: Colonoscopy    Clinical Lists Changes  Observations: Added new observation of COLONNXTDUE: 11/2019 (11/26/2009 12:30)

## 2010-10-30 NOTE — Procedures (Signed)
Summary: summary Report  summary Report   Imported By: Erle Crocker 05/08/2010 11:14:29  _____________________________________________________________________  External Attachment:    Type:   Image     Comment:   External Document

## 2010-10-30 NOTE — Miscellaneous (Signed)
Summary: med ordered in RR to begin after discharge  Clinical Lists Changes  Medications: Added new medication of ANAMANTLE HC 3-0.5 % CREA (LIDOCAINE-HYDROCORTISONE ACE) 2-3 times daily as needed; dispense large quantity with 6 refills Observations: Added new observation of ALLERGY REV: Done (11/26/2009 11:51) Added new observation of NKA: T (11/26/2009 11:51)

## 2010-10-30 NOTE — Progress Notes (Signed)
Summary: pt having trouble urinating  Medications Added COUMADIN 5 MG TABS (WARFARIN SODIUM) take one tablet by mouth on "sunday thru wednesday, then as directed.       Phone Note Call from Patient Call back at Home Phone [phone]   Caller: Patient Reason for Call: Talk to Nurse, Talk to Doctor Summary of Call: pt was put on prodaxa and he is having trouble urinating and he has a temp and was told by Debra if he has trouble to call Initial call taken by: Lela Graham,  May 02, 2010 8:09 AM  Follow-up for Phone Call        Pt. called c/o of not been able to urinate and having pressure on bladder. Patient  also c/o of having a fever of 102.0 and nauses. He states had vomited x1 clear yellow stomach content. These symptoms started about 1:00 or 2:00 AM. Pt had a rooth canal on Tuesday. Also he started taken  pradaxa 150 mg twice a day the same day. Pt states he did not have this problem untill he started taken  this medication. He  has not taken  pradaxa today. He said has been able to urinate a little and has some pressure on bladder. Nivida Murphy, RN, BSN  May 02, 2010 8:43 AM  Dr. Crenshaw recommeded to stop Pradaxa, start Coumodin 5 mg once daily on Sunday 7th/11, Check INR in the [facility] on wednesday 05/07/10. MD also recommends  for pt. to seen his PCP today. Pt. has an appointment in the coumodin clinic on wednesday 10th at 12:15 PM. Pt aware of MD's  recommendations. Follow-up by: Nivida Murphy, RN, BSN,  May 02, 2010 9:07 AM  Additional Follow-up for Phone Call Additional follow up Details #1::        As above; dc pradaxa; begin coumadin 5 mg by mouth daily in 2 days (8/7); check pt in coumadin clinic 8/10; needs to be seen by his primary care today for possible UTI. Brian Saunders Crenshaw, MD, FACC  May 02, 2010 12:20 PM     New/Updated Medications: COUMADIN 5 MG TABS (WARFARIN SODIUM) take one tablet by mouth on sunday thru wednesday, then as  directed. Prescriptions: COUMADIN 5 MG TABS (WARFARIN SODIUM) take one tablet by mouth on sunday thru wednesday, then as directed.  #30 x 4   Entered by:   Nivida Murphy, RN, BSN   Authorized by:   Brian Saunders Crenshaw, MD, FACC   Signed by:   Nivida Murphy, RN, BSN on 05/02/2010   Method used:   [facility]        [phone]       [address]       [place], [place]  [address]       Ph: [phone] or [phone]       Fax: [phone]   RxID:   [id number]

## 2010-10-30 NOTE — Assessment & Plan Note (Signed)
Summary: f33m from 9.8.11      Allergies Added: NKDA  Visit Type:  Follow-up   History of Present Illness: Joshua [name] returns today for followup of SVT and palpitations.  He underwent EPS/RFA of a presumed SVT several months ago. At that time his SVT was difficult to induce but he had no evidence an accessory pathway and dual AV nodal physiology and underwent slow pathway modification.  Prior to this he had had SVT terminate with Adenosine.  Since his ablation, he has had two episodes of palpitations, each lasting about one hour.  No other complaints.  Current Medications (verified): 1)  Atenolol 50 Mg Tabs (Atenolol) .Marland Kitchen.. 1 Tab By Mouth Two Times A Day 2)  Quinapril Hcl 40 Mg Tabs (Quinapril Hcl) .Marland Kitchen.. 1 Tab By Mouth Once Daily 3)  Pradaxa 150 Mg Caps (Dabigatran Etexilate Mesylate) .... One Tablet By Mouth Two Times A Day 4)  Actos 45 Mg Tabs (Pioglitazone Hcl) .Marland Kitchen.. 1 Tab By Mouth Once Monday, Wednesday and Friday 5)  Fenofibrate Micronized 200 Mg Caps (Fenofibrate Micronized) .Marland Kitchen.. 1 Tab By Mouth Once Daily 6)  Lipitor 40 Mg Tabs (Atorvastatin Calcium) .Marland Kitchen.. 1 Tab By Mouth Once Daily 7)  Aspirin Adult Low Strength 81 Mg Tbec (Aspirin) .Marland Kitchen.. 1 Tab By Mouth Qd 8)  Nitrostat 0.4 Mg Subl (Nitroglycerin) .Marland Kitchen.. 1 Tablet Under Tongue At Onset of Chest Pain; You May Repeat Every 5 Minutes For Up To 3 Doses.  Allergies (verified): No Known Drug Allergies  Past History:  Past Medical History: Last updated: 04/29/2010 CAD (ICD-414.00) HYPERLIPIDEMIA (ICD-272.4) HYPERTENSION (ICD-401.9) DM (ICD-250.00) RENAL INSUFFICIENCY (ICD-588.9)  Past Surgical History: Last updated: 08/16/2009 Successful two-vessel percutaneous coronary intervention of  the left mid left circumflex and proximal left anterior descending  artery using Cypher drug-eluting stents in each vessel.  [name]. [name], MD   Electronically Signed  MDC/MEDQ  D:  11/07/2007  T:  11/08/2007  Job:  [id number]   Review of Systems  The  patient denies chest pain, syncope, dyspnea on exertion, and peripheral edema.    Vital Signs:  Patient profile:   60 year old male Height:      68 inches Weight:      268 pounds BMI:     40.90 Pulse rate:   53 / minute BP sitting:   140 / 76  (left arm)  Vitals Entered By: Laurance Flatten CMA (October 01, 2010 3:58 PM)  Physical Exam  General:  Well-developed well-nourished in no acute distress.  Skin is warm and dry.  HEENT is normal.  Neck is supple. No thyromegaly.  Chest is clear to auscultation with normal expansion.  Cardiovascular exam is regular bradycardia. Abdominal exam nontender or distended. No masses palpated. Extremities show no edema. neuro grossly intact    EKG  Procedure date:  10/01/2010  Findings:      Sinus bradycardia with rate of:  52.  Impression & Recommendations:  Problem # 1:  PALPITATIONS (ICD-785.1) It is unclear whether he is having recurrent SVT, atrial fib, or something else.  I have recommended he continue a period of watchful waiting and call us if he has recurrent symptoms. He may require a monitor.  He will continue his beta blocker. His updated medication list for this problem includes:    Atenolol 50 Mg Tabs (Atenolol) .Marland Kitchen... 1 tab by mouth two times a day    Quinapril Hcl 40 Mg Tabs (Quinapril hcl) .Marland Kitchen... 1 tab by mouth once daily  Aspirin Adult Low Strength 81 Mg Tbec (Aspirin) .Marland Kitchen... 1 tab by mouth qd    Nitrostat 0.4 Mg Subl (Nitroglycerin) .Marland Kitchen... 1 tablet under tongue at onset of chest pain; you may repeat every 5 minutes for up to 3 doses.  Problem # 2:  CAD (ICD-414.00) He denies c/p or sob.  Continue his current meds. His updated medication list for this problem includes:    Atenolol 50 Mg Tabs (Atenolol) .Marland Kitchen... 1 tab by mouth two times a day    Quinapril Hcl 40 Mg Tabs (Quinapril hcl) .Marland Kitchen... 1 tab by mouth once daily    Aspirin Adult Low Strength 81 Mg Tbec (Aspirin) .Marland Kitchen... 1 tab by mouth qd    Nitrostat 0.4 Mg Subl  (Nitroglycerin) .Marland Kitchen... 1 tablet under tongue at onset of chest pain; you may repeat every 5 minutes for up to 3 doses.  Patient Instructions: 1)  Your physician recommends that you schedule a follow-up appointment in: 4 months with Dr Ladona Ridgel

## 2010-11-10 ENCOUNTER — Telehealth: Payer: Self-pay | Admitting: Internal Medicine

## 2010-11-12 ENCOUNTER — Encounter: Payer: Self-pay | Admitting: Internal Medicine

## 2010-11-12 ENCOUNTER — Encounter (INDEPENDENT_AMBULATORY_CARE_PROVIDER_SITE_OTHER): Payer: PRIVATE HEALTH INSURANCE

## 2010-11-12 DIAGNOSIS — I4891 Unspecified atrial fibrillation: Secondary | ICD-10-CM

## 2010-11-15 DIAGNOSIS — I4891 Unspecified atrial fibrillation: Secondary | ICD-10-CM

## 2010-11-17 ENCOUNTER — Telehealth: Payer: Self-pay | Admitting: Internal Medicine

## 2010-11-18 ENCOUNTER — Encounter: Payer: Self-pay | Admitting: Internal Medicine

## 2010-11-18 ENCOUNTER — Ambulatory Visit (INDEPENDENT_AMBULATORY_CARE_PROVIDER_SITE_OTHER): Payer: PRIVATE HEALTH INSURANCE | Admitting: Internal Medicine

## 2010-11-18 DIAGNOSIS — I471 Supraventricular tachycardia: Secondary | ICD-10-CM

## 2010-11-18 DIAGNOSIS — I2589 Other forms of chronic ischemic heart disease: Secondary | ICD-10-CM

## 2010-11-18 DIAGNOSIS — I4892 Unspecified atrial flutter: Secondary | ICD-10-CM

## 2010-11-19 NOTE — Progress Notes (Signed)
Summary: heart beat   Phone Note Call from Patient Call back at [phone]   Caller: Patient Reason for Call: Talk to Nurse Summary of Call: Pt states his heat beat is irregular since Friday. pt states his rate is 80. no chest pain no sob. Initial call taken by: Roe Coombs,  November 10, 2010 11:09 AM  Follow-up for Phone Call        will need to get an EKG if we can.  It was irregular and HR was 80bpm  Now his HR is in the 50's and regular  Talked to patient will call me back if reoccurs Dennis Bast, RN, BSN  November 10, 2010 1:00 PM     Appended Document: heart beat pt showed up in lobby today saying having palpations  will order monitor

## 2010-11-19 NOTE — Miscellaneous (Signed)
Summary: 48 hour monitor/event  Clinical Lists Changes  Orders: Added new Referral order of Holter Monitor (Holter Monitor) - Signed

## 2010-11-25 NOTE — Progress Notes (Signed)
Summary: re monitor   Phone Note Call from Patient Call back at Home Phone [phone]   Caller: Patient Reason for Call: Talk to Nurse Summary of Call: pt wants to know if he can send the monitor back. ruth states she got everything she needed but she said to check with you to make sure if you wanted the pt to take it off. Initial call taken by: Roe Coombs,  November 17, 2010 3:54 PM  Follow-up for Phone Call        spoke with pt ok to send monitor in and will arrange a follow up with Dr Russ Halo, RN, BSN  November 17, 2010 4:36 PM

## 2010-11-25 NOTE — Assessment & Plan Note (Signed)
Summary: f/u on monitor/kl  Medications Added DIGOXIN 0.25 MG TABS (DIGOXIN) one by mouth daily      Allergies Added: ! NIACIN  Visit Type:  Follow-up   History of Present Illness: [name] returns today for followup. He is a pleasant 60 yo man with a h/o SVT which terminated with adenosine. He had EPS/RFA of a slow pathway after have no sustained SVT but double and triple echo beats and a PR>RR. He subsequently had recurrent palpitations and an ECG demonstrated atrial flutter with a controlled ventricular response. He returns for eval. He denies palpitations except when he sits in his chair.  No other complaints. He had a 48 hour holter which demonstrates both atrial fib, flutter and nocturnal pauses of over 3 seconds.  Current Medications (verified): 1)  Atenolol 50 Mg Tabs (Atenolol) .Marland Kitchen.. 1 Tab By Mouth Two Times A Day 2)  Quinapril Hcl 40 Mg Tabs (Quinapril Hcl) .Marland Kitchen.. 1 Tab By Mouth Once Daily 3)  Pradaxa 150 Mg Caps (Dabigatran Etexilate Mesylate) .... One Tablet By Mouth Two Times A Day 4)  Actos 45 Mg Tabs (Pioglitazone Hcl) .Marland Kitchen.. 1 Tab By Mouth Once Monday, Wednesday and Friday 5)  Fenofibrate Micronized 200 Mg Caps (Fenofibrate Micronized) .Marland Kitchen.. 1 Tab By Mouth Once Daily 6)  Lipitor 40 Mg Tabs (Atorvastatin Calcium) .Marland Kitchen.. 1 Tab By Mouth Once Daily 7)  Aspirin Adult Low Strength 81 Mg Tbec (Aspirin) .Marland Kitchen.. 1 Tab By Mouth Qd 8)  Nitrostat 0.4 Mg Subl (Nitroglycerin) .Marland Kitchen.. 1 Tablet Under Tongue At Onset of Chest Pain; You May Repeat Every 5 Minutes For Up To 3 Doses.  Allergies (verified): 1)  ! Niacin  Past History:  Past Medical History: Last updated: 04/29/2010 CAD (ICD-414.00) HYPERLIPIDEMIA (ICD-272.4) HYPERTENSION (ICD-401.9) DM (ICD-250.00) RENAL INSUFFICIENCY (ICD-588.9)  Past Surgical History: Last updated: 08/16/2009 Successful two-vessel percutaneous coronary intervention of  the left mid left circumflex and proximal left anterior descending  artery using Cypher  drug-eluting stents in each vessel.  Joshua [name]. [name], MD   Electronically Signed  MDC/MEDQ  D:  11/07/2007  T:  11/08/2007  Job:  [id number]   Review of Systems  The patient denies chest pain, syncope, dyspnea on exertion, and peripheral edema.    Vital Signs:  Patient profile:   60 year old male Height:      68 inches Weight:      262 pounds BMI:     39.98 Pulse rate:   75 / minute Pulse rhythm:   irregular BP sitting:   130 / 80  (left arm)  Vitals Entered By: Laurance Flatten CMA (November 18, 2010 10:33 AM)  Physical Exam  General:  Well-developed well-nourished in no acute distress.  Skin is warm and dry.  HEENT is normal.  Neck is supple. No thyromegaly.  Chest is clear to auscultation with normal expansion.  Cardiovascular exam is regular bradycardia. Abdominal exam nontender or distended. No masses palpated. Extremities show no edema. neuro grossly intact    Holter Monitor  Procedure date:  11/12/2010  Findings:      NSR, atrial fib, and atrial flutter. Controlled ventricular response. Nocturnal pauses of 3.3 seconds.  Impression & Recommendations:  Problem # 1:  ATRIAL FIBRILLATION (ICD-427.31) His symptoms of atrial fib and flutter are present but fairly mild. I have asked him to undergo watchful waiting and have asked him to start taking digoxin along with atenolol. If his symptoms are not well controlled then I would consider starting tikosyn in the hospital with  his h/o CAD and stenting. If he fails anti-arrhythmic therapy, I would recommend catheter ablation of both his atrial fib and flutter. His updated medication list for this problem includes:    Atenolol 50 Mg Tabs (Atenolol) .Marland Kitchen... 1 tab by mouth two times a day    Aspirin Adult Low Strength 81 Mg Tbec (Aspirin) .Marland Kitchen... 1 tab by mouth qd    Digoxin 0.25 Mg Tabs (Digoxin) ..... One by mouth daily  Problem # 2:  CAD (ICD-414.00) He denies anginal symptoms. Will follow. His updated medication list for this  problem includes:    Atenolol 50 Mg Tabs (Atenolol) .Marland Kitchen... 1 tab by mouth two times a day    Quinapril Hcl 40 Mg Tabs (Quinapril hcl) .Marland Kitchen... 1 tab by mouth once daily    Aspirin Adult Low Strength 81 Mg Tbec (Aspirin) .Marland Kitchen... 1 tab by mouth qd    Nitrostat 0.4 Mg Subl (Nitroglycerin) .Marland Kitchen... 1 tablet under tongue at onset of chest pain; you may repeat every 5 minutes for up to 3 doses.  Problem # 3:  HYPERTENSION (ICD-401.9) He will contiue his current meds and maintain a low sodium diet. His updated medication list for this problem includes:    Atenolol 50 Mg Tabs (Atenolol) .Marland Kitchen... 1 tab by mouth two times a day    Quinapril Hcl 40 Mg Tabs (Quinapril hcl) .Marland Kitchen... 1 tab by mouth once daily    Aspirin Adult Low Strength 81 Mg Tbec (Aspirin) .Marland Kitchen... 1 tab by mouth qd  Patient Instructions: 1)  Your physician wants you to follow-up in: 2-3 months with Dr Court Joy will receive a reminder letter in the mail two months in advance. If you don't receive a letter, please call our office to schedule the follow-up appointment. 2)  2 weeks for a nurse visit with and EKG and Digoxin level  427.32  427.31 3)  Your physician has recommended you make the following change in your medication: start Digoxin 0.25mg  daily Prescriptions: DIGOXIN 0.25 MG TABS (DIGOXIN) one by mouth daily  #30 x 6   Entered by:   Dennis Bast, RN, BSN   Authorized by:   Laren Boom, MD, [facility]   Signed by:   Dennis Bast, RN, BSN on 11/18/2010   Method used:   Electronically to        [phone] (retail)       [address]       [place], [place]  [address]       Ph: [phone] or [phone]       Fax: [phone]   RxID:   [id number]

## 2010-11-25 NOTE — Letter (Signed)
Summary: Work Writer, Main Office  [address]   [place], [place] [address]   Phone: [phone]  Fax: [phone]     November 18, 2010    Joshua [name]   The above named patient had a medical visit today at: 10:45 am .  Please take this into consideration when reviewing the time away from work.  If you have any questions please call our office at [phone].    Sincerely yours,   Anselm Pancoast

## 2010-12-05 ENCOUNTER — Encounter (INDEPENDENT_AMBULATORY_CARE_PROVIDER_SITE_OTHER): Payer: Self-pay | Admitting: *Deleted

## 2010-12-05 ENCOUNTER — Encounter: Payer: Self-pay | Admitting: Internal Medicine

## 2010-12-05 ENCOUNTER — Ambulatory Visit (INDEPENDENT_AMBULATORY_CARE_PROVIDER_SITE_OTHER): Payer: PRIVATE HEALTH INSURANCE

## 2010-12-05 ENCOUNTER — Other Ambulatory Visit (INDEPENDENT_AMBULATORY_CARE_PROVIDER_SITE_OTHER): Payer: PRIVATE HEALTH INSURANCE

## 2010-12-05 ENCOUNTER — Telehealth: Payer: Self-pay | Admitting: Internal Medicine

## 2010-12-05 DIAGNOSIS — I4892 Unspecified atrial flutter: Secondary | ICD-10-CM

## 2010-12-05 DIAGNOSIS — I4891 Unspecified atrial fibrillation: Secondary | ICD-10-CM

## 2010-12-09 NOTE — Miscellaneous (Signed)
Summary: Orders Update  Clinical Lists Changes  Orders: Added new Test order of T-Digoxin [id number]) - Signed

## 2010-12-09 NOTE — Progress Notes (Signed)
Summary: EKG/BP-nurse visit  Phone Note Call from Patient   Caller: Patient Summary of Call: Patient here for f/u BP and EKG per Dr. Ladona Ridgel.  BP 132/69,HR45,weight 254.  EKG done and read by Dr. Ladona Ridgel.  Advised patient per Dr. Ladona Ridgel to decrease nighttime dose of Atenolol to 1/2 tablet(25mg ).  Return for f/u in 3 months.  Appt already scheduled for May.  To return sooner if has any problems of chest pain. Initial call taken by: Dessie Coma  LPN,  December 05, 2010 9:19 AM  Follow-up for Phone Call        Agree Follow-up by: Laren Boom, MD, [facility],  December 06, 2010 9:03 PM

## 2010-12-09 NOTE — Miscellaneous (Signed)
Summary: Orders Update  Clinical Lists Changes  Orders: Added new Test order of T-Digoxin ([id number]) - Signed 

## 2010-12-12 LAB — GLUCOSE, CAPILLARY: Glucose-Capillary: 122 mg/dL — ABNORMAL HIGH (ref 70–99)

## 2010-12-12 LAB — PSA: PSA: 37.87 ng/mL — ABNORMAL HIGH (ref 0.10–4.00)

## 2010-12-12 LAB — HEPARIN LEVEL (UNFRACTIONATED)
Heparin Unfractionated: 0.25 IU/mL — ABNORMAL LOW (ref 0.30–0.70)
Heparin Unfractionated: 0.3 IU/mL (ref 0.30–0.70)
Heparin Unfractionated: 0.36 IU/mL (ref 0.30–0.70)
Heparin Unfractionated: 0.46 IU/mL (ref 0.30–0.70)
Heparin Unfractionated: <0.1 IU/mL — ABNORMAL LOW (ref 0.30–0.70)

## 2010-12-12 LAB — URINE CULTURE: Colony Count: NO GROWTH

## 2010-12-12 LAB — POCT CARDIAC MARKERS
CKMB, poc: 1 ng/mL (ref 1.0–8.0)
Myoglobin, poc: 87.1 ng/mL (ref 12–200)
Myoglobin, poc: 90.7 ng/mL (ref 12–200)
Troponin i, poc: <0.05 ng/mL (ref 0.00–0.09)

## 2010-12-12 LAB — CBC
HCT: 37.8 % — ABNORMAL LOW (ref 39.0–52.0)
HCT: 39.8 % (ref 39.0–52.0)
HCT: 44.1 % (ref 39.0–52.0)
Hemoglobin: 12.2 g/dL — ABNORMAL LOW (ref 13.0–17.0)
Hemoglobin: 13.4 g/dL (ref 13.0–17.0)
Hemoglobin: 14.6 g/dL (ref 13.0–17.0)
MCH: 30.3 pg (ref 26.0–34.0)
MCH: 30.8 pg (ref 26.0–34.0)
MCH: 31.3 pg (ref 26.0–34.0)
MCHC: 32 g/dL (ref 30.0–36.0)
MCHC: 33.1 g/dL (ref 30.0–36.0)
MCV: 94.8 fL (ref 78.0–100.0)
Platelets: 115 10*3/uL — ABNORMAL LOW (ref 150–400)
Platelets: 131 10*3/uL — ABNORMAL LOW (ref 150–400)
Platelets: 155 10*3/uL (ref 150–400)
Platelets: 199 10*3/uL (ref 150–400)
RBC: 3.82 MIL/uL — ABNORMAL LOW (ref 4.22–5.81)
RBC: 3.83 MIL/uL — ABNORMAL LOW (ref 4.22–5.81)
RBC: 3.96 MIL/uL — ABNORMAL LOW (ref 4.22–5.81)
RBC: 4.23 MIL/uL (ref 4.22–5.81)
RDW: 14 % (ref 11.5–15.5)
RDW: 14.3 % (ref 11.5–15.5)
WBC: 11 10*3/uL — ABNORMAL HIGH (ref 4.0–10.5)
WBC: 4.4 10*3/uL (ref 4.0–10.5)

## 2010-12-12 LAB — COMPREHENSIVE METABOLIC PANEL
ALT: 26 U/L (ref 0–53)
Alkaline Phosphatase: 28 U/L — ABNORMAL LOW (ref 39–117)
BUN: 13 mg/dL (ref 6–23)
CO2: 23 mEq/L (ref 19–32)
GFR calc non Af Amer: 59 mL/min — ABNORMAL LOW (ref >60)
Glucose, Bld: 97 mg/dL (ref 70–99)
Potassium: 4.1 mEq/L (ref 3.5–5.1)
Sodium: 138 mEq/L (ref 135–145)

## 2010-12-12 LAB — BASIC METABOLIC PANEL
BUN: 21 mg/dL (ref 6–23)
CO2: 23 mEq/L (ref 19–32)
Chloride: 108 mEq/L (ref 96–112)
Creatinine, Ser: 1.34 mg/dL (ref 0.4–1.5)
GFR calc Af Amer: >60 mL/min (ref >60)
GFR calc non Af Amer: 54 mL/min — ABNORMAL LOW (ref >60)
Glucose, Bld: 100 mg/dL — ABNORMAL HIGH (ref 70–99)
Sodium: 139 mEq/L (ref 135–145)

## 2010-12-12 LAB — CARDIAC PANEL(CRET KIN+CKTOT+MB+TROPI)
CK, MB: 3.9 ng/mL (ref 0.3–4.0)
CK, MB: 4.8 ng/mL — ABNORMAL HIGH (ref 0.3–4.0)
Relative Index: 1.5 (ref 0.0–2.5)
Relative Index: 1.6 (ref 0.0–2.5)
Relative Index: 1.9 (ref 0.0–2.5)
Total CK: 217 U/L (ref 7–232)
Total CK: 252 U/L — ABNORMAL HIGH (ref 7–232)
Troponin I: 0.2 ng/mL — ABNORMAL HIGH (ref 0.00–0.06)

## 2010-12-12 LAB — PROTIME-INR
INR: 1.09 (ref 0.00–1.49)
Prothrombin Time: 14.3 seconds (ref 11.6–15.2)

## 2011-01-16 ENCOUNTER — Telehealth: Payer: Self-pay | Admitting: Cardiology

## 2011-01-19 NOTE — Telephone Encounter (Signed)
This was done on Friday -- called to inform pt -- lmom

## 2011-01-19 NOTE — Telephone Encounter (Signed)
Pt needs lipitor to be called into [facility] [place] [facility] [phone]. Pt called on firday.

## 2011-02-07 ENCOUNTER — Encounter: Payer: Self-pay | Admitting: *Deleted

## 2011-02-07 ENCOUNTER — Encounter: Payer: Self-pay | Admitting: Cardiology

## 2011-02-10 ENCOUNTER — Encounter: Payer: Self-pay | Admitting: Internal Medicine

## 2011-02-10 ENCOUNTER — Ambulatory Visit (INDEPENDENT_AMBULATORY_CARE_PROVIDER_SITE_OTHER): Payer: PRIVATE HEALTH INSURANCE | Admitting: Internal Medicine

## 2011-02-10 DIAGNOSIS — I10 Essential (primary) hypertension: Secondary | ICD-10-CM

## 2011-02-10 DIAGNOSIS — I4891 Unspecified atrial fibrillation: Secondary | ICD-10-CM

## 2011-02-10 DIAGNOSIS — I2510 Atherosclerotic heart disease of native coronary artery without angina pectoris: Secondary | ICD-10-CM

## 2011-02-10 NOTE — H&P (Signed)
NAMEPERLEY, ARTHURS                ACCOUNT NO.:  [id number]   MEDICAL RECORD NO.:  [id number]          PATIENT TYPE:  INP   LOCATION:  3707                         FACILITY:  [facility]   PHYSICIAN:  Rollene Rotunda, MD, FACCDATE OF BIRTH:  04/15/1951   DATE OF ADMISSION:  11/04/2007  DATE OF DISCHARGE:                              HISTORY & PHYSICAL   PRIMARY CARE PHYSICIAN:  Barry Dienes. Eloise Harman, M.D.   CHIEF COMPLAINT:  Chest pain.   HISTORY OF THE PRESENT ILLNESS:  This is a 60 year old male with a past  medical history of diabetes, hypertension and hyperlipidemia with one-  week of chest pain.  The chest pain typically occurs after eating and  with activity such as chopping wood.  The pain is described as a heavy  weight located in the center of the chest with associated left arm pain  and right leg pain.  The pain is improved with belching, but not  necessarily with resting.  He states he has also been having chest pain  when he lies down flat that is the same as his chest pain with exertion,  though there is no associated shortness of breath.  He also has had  occasional pains in his chin associated with these symptoms.  Today  while he was lying flat to go to sleep at around 8:45 P.M. he developed  severe 10/10 chest pain, again described as a heavy weight with  associated diaphoresis. He immediately took 2 aspirins and contacted  EMS.  On the way to the emergency room he received 3 sublingual tabs of  nitro in addition two additional aspirins for a total of 324 mg of  aspirin with relief of his pain going from a 10/10 to a 3/10.   ALLERGIES:  The patient has no known drug allergies.   PAST MEDICAL HISTORY:  1. Type 2 diabetes with a hemoglobin A-1-C of less than 6.  2. Hypertension that he states is generally well controlled.  3. Hyperlipidemia, though he states his overall cholesterol is in the      180s with an LDL that is high and an HDL that is low.  4. The patient also has  gout and back pain.   MEDICATIONS:  1. Actos 45 mg once a day.  2. Tricor 145 once a day.  3. Atenolol 50 mg once a day.  4. Quinapril 40 mg once a day.  5. Aspirin 81 mg once a day.  6. Centrum Silver once daily.   SOCIAL HISTORY:  The [place].  He is  Retail banker.  He is married with two children.  No smoking  history.  No alcohol history.  No drugs.  No herbal medication.   FAMILY HISTORY:  The patient's is deceased at age 16 with hypertension,  gout and Alzheimer's.  His father died of CHF in his early 67s, but had  multiple MIs prior to this possibly as early as his 29s.  He had a  brother, who is deceased, with substance abuse, heart problems and  cancer.   REVIEW  OF SYSTEMS:  The review of systems is pertinent for occasional  sweating.  He urinates every 2.5 hours, but that has been going on for a  long time.  Though he has a history of GERD he has not had any symptoms  in over one year and has not been on any medications.  The review of  systems is otherwise negative.   PHYSICAL EXAMINATION:  VITAL SIGNS:  Temperature 97, pun 110,  respiratory rate 20, blood pressure 142/81, and O2 sat 94% on room air.  GENERAL APPEARANCE:  The patient is in no apparent distress.  HEENT:  The head, eyes, ears, nose and throat are within normal limits.  NECK:  The neck is supple with no JVD, no carotid bruits and no  thyromegaly.  He has no lymphadenopathy.  HEART:  Cardiovascular exam - S1 and S2.  No murmurs.  Regular rate and  rhythm.  Normal PMI.  Pulses 2+ bilaterally in his lower extremities.  LUNGS:  The lungs are clear to auscultation bilaterally without wheezes  or crackles.  SKIN:  The patient has no rashes on his skin, though he does have a fat  pad versus a hump on the back of his neck.  ABDOMEN:  The abdomen is soft, nontender and nondistended with normal  bowel sounds.  EXTREMITIES:  The extremities have no edema.  NEUROLOGIC:  On  neuro exam is alert and oriented times three.  Cranial  nerves II-XII are intact and with normal sensation throughout.   LABORATORY DATA:  The patient had a portable chest x-ray that showed  mild bibasilar atelectasis; nothing acute.  His EKG had a rate of 97,  normal axis, normal sinus rhythm, normal intervals, no hypertrophy, and  nonspecific ST changes in lead 2 and V1.   Laboratory data:  Hemoglobin 13.6, hematocrit 40, white blood cell count  4.1 and platelets 206,000.  Sodium 140, potassium 3.6, chloride 109,  bicarb 22, BUN 24, creatinine 1.31 with a GFR of 56, glucose 157, and  calcium 9.2.  Total bilirubin 0.6, alk phos 27, AST 26, ALT 22, total  protein 6, and albumin 4.  First set of cardiac enzymes; MB 1.5 and  troponin I less than 0.05.   ASSESSMENT AND PLAN:  This is a 60 year old male with a history of  diabetes, hypertension, hyperlipidemia, a positive family history, and  daily aspirin use with symptoms concerning for stable versus unstable  angina.  He has a TIMI score of 3.  We will admit him to rule out and  risk stratify him with aspirin, Lovenox 1 mg/kg every 12 hours, beta  blocker, angiotensin converting enzyme inhibitors, start a nitro drip  for pain and blood pressure control, and as needed morphine.  We will  check his enzymes and check electrocardiograms, and check a hemoglobin A-  1-C, thyroid stimulating hormone and fasting lipid panel.  We will  grossly manage his hypertension while based on his risk stratification  we will determine the extent of his cardiac event as to whether he will  continue as an inpatient with a catheterization versus an outpatient  adenosine Cardiolite or exercise stress test.      Valetta Close, M.D.  Electronically Signed      Rollene Rotunda, MD, [facility]  Electronically Signed   JC/MEDQ  D:  11/05/2007  T:  11/05/2007  Job:  [id number]

## 2011-02-10 NOTE — Patient Instructions (Signed)
Your physician wants you to follow-up in: 6 months with Dr Taylor You will receive a reminder letter in the mail two months in advance. If you don't receive a letter, please call our office to schedule the follow-up appointment.  

## 2011-02-10 NOTE — Assessment & Plan Note (Signed)
[facility]                            CARDIOLOGY OFFICE NOTE   MANUAL, NAVARRA                       MRN:          [id number]  DATE:05/21/2008                            DOB:          1951/02/27    Joshua [name] is a 60 year old gentleman with diabetes, hypertension,  hyperlipidemia, coronary artery disease.  In February 2009, the patient  had PCI of his left circumflex and proximal LAD using Cypher drug-  eluting stents.  When I last saw him on Feb 17, 2008, he was complaining  of occasional chest tightness.  We were concerned and scheduled him to  have stress Myoview, but he cancelled this.  Since that time, he has not  had any exertional chest pain, dyspnea on exertion, orthopnea, PND, or  pedal edema.  He is walking 3 miles 4 times per week.  He does not  smoke.  He is trying to adjust his diet.   MEDICATIONS:  His medications include:  1. Atenolol 50 mg p.o. daily.  2. Quinapril 40 mg p.o. daily.  3. Plavix 75 mg p.o. daily.  4. Actos 45 mg p.o. daily.  5. TriCor 145 mg p.o. daily.  6. Lipitor 40 mg p.o. daily.  7. Aspirin 81 mg p.o. daily.   PHYSICAL EXAMINATION:  VITAL SIGNS:  Physical exam today shows a blood  pressure of 119/76 and his pulse is 58.  He weighs 253 pounds.  HEENT:  Normal.  NECK:  Supple.  CHEST:  Clear.  CARDIOVASCULAR:  Regular rate and rhythm.  ABDOMEN:  No tenderness.  EXTREMITIES:  No edema.   DIAGNOSES:  1. Coronary artery disease - Mr. Brazel has had no further chest pain      or shortness of breath.  We will continue his medical therapy to      include aspirin, Plavix, ACE inhibitor, beta-blocker, and statin.      He will also continue with diet and exercise.  He does not smoke.  2. Hyperlipidemia - We will check lipids and liver and adjust his      regimen as indicated.  3. Hypertension - His blood pressure is in adequate control on his      present medications.  4. Diabetes mellitus - Managed per his  primary care physician.   I will see him back in 6 months.     Madolyn Frieze Jens Som, MD, [facility]  Electronically Signed    BSC/MedQ  DD: 05/21/2008  DT: 05/22/2008  Job #: [id number]   cc:   Barry Dienes. Eloise Harman, M.D.

## 2011-02-10 NOTE — Assessment & Plan Note (Signed)
[facility]                            CARDIOLOGY OFFICE NOTE   Joshua, [name]                       MRN:          [id number]  DATE:02/17/2008                            DOB:          Nov 29, 1950    Joshua [name] is a 60 year old male with past medical history of diabetes,  hypertension, hyperlipidemia, and coronary disease who returns for  followup.  His cardiac history dates back to February 6th of this year.  At that time, he presented with complaints of chest pain.  He ultimately  underwent cardiac catheterization.  He was found to have an eccentric 75-  80% stenosis in the proximal LAD.  The first diagonal with takeoff in  that particular area was also subtotally occluded.  The distal portion  of the circumflex had 99% stenosis.  The right coronary artery had 50%  lesions.  His ejection fraction was 65-70%.  The patient ultimately  underwent PCI of his left circumflex and proximal LAD using Cypher drug-  eluting stents.  He was last seen on November 24, 2007.  Since he was  last seen, he denies any dyspnea, palpitations, syncope, or pedal edema.  He does state that since his stents were placed, he has some tightness  in his chest.  It is similar to his pain prior to his stents but less  severe.  He only feels this with walking long distances.  It typically  began at the beginning of his walk and improves after he continues his  walk.  Note, he has not had any throat pain like he had prior to the  stents being placed.  Note, he states this has been present ever since  his stents.  He is not bothered by this, and as we discussed it he  states I hate I mentioned it.  Note, the pain is not pleuritic or  positional nor is it related to food.  There is no associated nausea,  vomiting, shortness of breath, or diaphoresis.  It does not occur at  rest.  It is not worsened in severity or frequency.  He has also had  some problems with hemorrhaging in his  left eye.   PRESENT MEDICATIONS:  1. Atenolol 50 mg p.o. daily.  2. Quinapril 40 mg p.o. daily.  3. Plavix 75 mg p.o. daily.  4. Actos 45 mg p.o. daily  5. Tricor 145 mg p.o. daily.  6. Lipitor 40 mg p.o. daily.  7. Aspirin 325 mg p.o. daily.   PHYSICAL EXAMINATION:  VITAL SIGNS:  Blood pressure of 125/74, pulse 53.  He weighs 246 pounds.  HEENT:  Normal.  NECK:  Supple with no bruits.  CHEST:  Clear.  CARDIOVASCULAR:  Regular rhythm.  ABDOMEN:  No tenderness.  EXTREMITIES:  No edema.   Electrocardiogram shows sinus rhythm at a rate of 54.  There are no ST  changes noted.   DIAGNOSES:  1. Chest tightness.  Some of Joshua [name]'s symptoms are concerning for      coronary disease.  However, he does state they have been present  ever since his stents were put in  but to a much lesser degree.      Note, he does chop wood and has some vigorous activities with no      chest pain.  We will plan to proceed with a stress Myoview for risk      stratification.  We will have a low threshold for repeat      catheterization.  2. Coronary artery disease status post drug-eluting stents to the left      anterior descending and circumflex.  He will continue on his      present medications including his Plavix, beta-blocker, angiotensin-      converting enzyme inhibitor, and statin.  I have asked him to      decrease his aspirin to 81 mg p.o. daily, particularly in light of      his conjunctival hemorrhages.  3. Hyperlipidemia.  He will continue on his Tricor and Lipitor.  He      will need followup liver functions in July, as his serum glutamic      oxaloacetic transaminase was mildly elevated.  Note, his high-      density lipoprotein was also low, but we will continue with his      present dose of Lipitor and Tricor.  We discussed the importance of      exercising in this regard.  4. Hypertension.  His blood pressure is adequately controlled on his      present medications.  5.  Diabetes mellitus, managed per primary care physician.   He will continue with lifestyle modification including diet and  exercise.  He does not smoke.  We will see him back in 3 months.     Joshua Frieze Jens Som, MD, [facility]  Electronically Signed    BSC/MedQ  DD: 02/17/2008  DT: 02/17/2008  Job #: [id number]   cc:   Joshua [name]. Joshua [name], M.D.

## 2011-02-10 NOTE — Assessment & Plan Note (Signed)
[place] [facility]                            CARDIOLOGY OFFICE NOTE   AVON, MERGENTHALER                       MRN:          [id number]  DATE:11/24/2007                            DOB:          June 07, 1951    The patient is 60 year old male with diabetes, hypertension,  hyperlipidemia who was recently admitted to [facility] with  chest pain.  The patient did rule in for myocardial infarction.  He  underwent cardiac catheterization and was found to have significant  circumflex and LAD disease.  Note, his LV function was normal.  He  subsequently had drug-eluting stents to the left mid circumflex,  proximal LAD using Cypher drug-eluting stents.  Since then, he has done  well with no dyspnea, chest pain, palpitations or syncope.   CURRENT MEDICATIONS:  1. Atenolol 50 mg daily.  2. Quinapril 40 mg daily.  3. Plavix 75 mg daily.  4. Actos 45 mg daily.  5. Tricor 145 mg daily.  6. Simvastatin 40 mg p.o. daily.  7. Aspirin 325 mg daily.   PHYSICAL EXAM:  Today shows a blood pressure 115/80.  His pulse of 65.  Weight is 249 pounds.  HEENT:  Normal.  NECK:  Supple.  CHEST:  Clear.  CARDIOVASCULAR:  Regular rate and rhythm.  ABDOMEN: Shows no tenderness.  Right groin shows no hematoma and no  bruit.  EXTREMITIES:  Show no seen.   Electrocardiogram shows sinus rhythm at a rate of 65.  There are no ST  changes noted.   DIAGNOSES:  1. Coronary artery disease status post drug-eluting stent to the left      anterior descending artery  and circumflex - He has had no chest      pain or shortness of breath.  We will continue with his aspirin,      Plavix, beta-blocker, ACE inhibitor and statin.  2. Hyperlipidemia - He is now on Tricor and simvastatin.  I am      hesitant to continue the simvastatin given the potential      interaction with Tricor.  I will discontinue this and we will begin      Lipitor 40 mg daily. We will check lipids and liver in 6  weeks and      adjust with goal LDL less than 70.  3. Hypertension - His blood pressure is adequately controlled on his      present medications.  4. Diabetes mellitus - Management per his primary care physician.   We discussed the need for a lifestyle modification including diet,  exercise.  He does not smoke.  He will see me back in 3 months.     Madolyn Frieze Jens Som, MD, [facility]  Electronically Signed    BSC/MedQ  DD: 11/24/2007  DT: 11/24/2007  Job #: [id number]   cc:   Barry Dienes. Eloise Harman, M.D.

## 2011-02-10 NOTE — Cardiovascular Report (Signed)
Joshua [name], Joshua [name]                ACCOUNT NO.:  [id number]   MEDICAL RECORD NO.:  [id number]          PATIENT TYPE:  INP   LOCATION:  2807                         FACILITY:  [facility]   PHYSICIAN:  [name]. [name], MD  DATE OF BIRTH:  05-Aug-1951   DATE OF PROCEDURE:  11/07/2007  DATE OF DISCHARGE:                            CARDIAC CATHETERIZATION   PROCEDURES:  1. Intravascular ultrasound, percutaneous transluminal coronary      angioplasty and stenting of the left circumflex.  2. Intravascular ultrasound, percutaneous transluminal coronary      angioplasty and stenting of the left anterior descending artery.  3. Angio-Seal of the right femoral artery.   INDICATIONS:  Joshua [name] is a 60 year old gentleman who presented with a  non-ST-elevation MI.  He just underwent diagnostic catheterization by  Dr. Diona Browner.  He has underlying type 2 diabetes, hypertension and  dyslipidemia.  His diagnostic catheterization demonstrated critical left  circumflex disease with 99% stenosis in the midcircumflex just beyond a  large OM branch.  He also had severe eccentric proximal LAD disease that  angiographically appeared to be at least 80% stenosed.  His ventricular  function is preserved.  After reviewing with Dr. Diona Browner, I elected to  proceed with two-vessel PCI and planned drug-eluting stents.   A 6-French sheath was in place.  The patient was already on Lovenox and  Integrilin.  His Lovenox had just been given within the last 2-1/2  hours.  A factor X level was sent to the lab and came back greater than  1.  An 6-French XB-4 guide catheter was inserted.  A Cougar guidewire  was used to wire the left circumflex lesion.  This was done with a  moderate amount of difficulty.  After the wire was beyond the lesion  into the distal branch, I dilated the vessel with a 2.0 x 15-mm Maverick  balloon to up to 8 atmospheres.  Following predilatation, angiography  demonstrated an approximate 2.5-mm  vessel.  I elected to stent the  vessel with a 2.5 x 23-mm Cypher stent, which was deployed at 16  atmospheres.  Following stenting there was an excellent angiographic  result with a widely patent stent and TIMI-3 flow.  At that point I  elected to perform IVUS to check for good stent apposition and  expansion.  This demonstrated good apposition off the proximal and  distal edges of the stent.  However, there were portions in the middle  of the stent that were under-deployed.  I put decided to postdilate the  entire stented segment with a 2.75 x 20-mm Quantum Maverick balloon,  which was taken to 18 atmospheres on two inflations.  Following  postdilatation, there was an excellent angiographic result with TIMI-3  flow.  At that point I elected to move on to the LAD.  The same Cougar  guidewire was brought back and used to wire the LAD.  I decided to start  IVUS of the LAD to size the vessel and to find the morphology of the  plaque.  The IVUS was advanced with a moderate amount of difficulty past  the lesion.  There was heavy calcification throughout the proximal LAD  with severe stenosis demonstrated at the area that appeared the tightest  by angiography.  Because of the 360-degree rim of superficial calcium, I  elected to predilate the vessel.  The same 2.75 x 20-mm Quantum Maverick  balloon was used and it was taken to 12 atmospheres.  The balloon  expanded well.  Following balloon dilatation, angiography demonstrated  improvement in the appearance of the vessel although there likely was a  localized balloon dissection there.  I decided to treat the area with a  3.5 x 18-mm Cypher stent, which was carefully positioned just back to  the ostium of the LAD and then was deployed at 18 atmospheres.  Following stenting there was an excellent angiographic result.  Post-  stent IVUS demonstrated good stent expansion.  The most proximal edge of  the stent appeared well-expanded but it did not  quite reach the vessel  wall, but it was back into the left main stem just approximately 1 mm.  I elected to postdilate the stent with a 3.75 x 15-mm Quantum Maverick,  which was taken to 18 atmospheres on two inflations.  At the completion  of the procedure there was an excellent angiographic result with a  widely patent stent and TIMI-3 flow in the vessel.   ASSESSMENT:  Successful two-vessel percutaneous coronary intervention of  the left mid left circumflex and proximal left anterior descending  artery using Cypher drug-eluting stents in each vessel.   Recommend a minimum of 12 months of dual antiplatelet therapy with  aspirin and Plavix.  If Joshua [name] is tolerating those medications in  combination, I would favor 2 years of therapy.      [name]. [name], MD  Electronically Signed     MDC/MEDQ  D:  11/07/2007  T:  11/08/2007  Job:  [id number]

## 2011-02-10 NOTE — Assessment & Plan Note (Signed)
His symptoms appear to be well controlled. He will continue his current medication.

## 2011-02-10 NOTE — Discharge Summary (Signed)
NAMEABDULRAHIM, Joshua [name]                ACCOUNT NO.:  [id number]   MEDICAL RECORD NO.:  [id number]          PATIENT TYPE:  INP   LOCATION:  6529                         FACILITY:  [facility]   PHYSICIAN:  Madolyn Frieze. Jens Som, MD, FACCDATE OF BIRTH:  [birth year]   DATE OF ADMISSION:  11/04/2007  DATE OF DISCHARGE:  11/08/2007                               DISCHARGE SUMMARY   PRIMARY CARDIOLOGIST:  Dr. Olga Millers.   PRIMARY CARE PHYSICIAN:  Dr. Barry Dienes. [place].   PROCEDURES PERFORMED DURING HOSPITALIZATION:  1. Cardiac catheterization.      a.     Performed by Dr. Tonny Bollman on November 07, 2007.      b.     Mid circumflex 99% occluded, decreased to 0% with 2 x 5 x 23       Cypher drug-eluting stent with TIMI 3 to TIMI 3 flow.  Proximal       LAD 80% stenosed to 0% using a 3 x 5 x 18 Cypher drug-eluting       stent.   FINAL DISCHARGE DIAGNOSES:  1. ST elevated myocardial infarction.  2. Status post drug-eluting stent to the left anterior descending and      left circumflex.  3. Diabetes.  4. Hypertension.  5. Hypercholesterolemia.   HOSPITAL COURSE:  This is a 60 year old male with past medical history  of diabetes, hypertension, hyperlipidemia with ongoing chest pain over 1  week's time that occurs usually after eating and with activity such as  chopping wood.  The pain was described as heavy, located in the center  of his chest with associated left arm and right leg pain. The pain  improved with belching but not necessarily with resting.  He stated he  had been having chest pain when he lies down or lies flat and same chest  pain with exertion although there is no associated shortness of breath.  On day of admission, while lying flat to go to sleep around 8:45 p.m.,  he developed severe 10/10 chest pain described as heavy weight and  associated diaphoresis.  He took 2 aspirin and contacted EMS and brought  him to the emergency room.  En route he was given sublingual  nitroglycerin and 2 additional aspirin for a total of 324 mg of aspirin  with relief of ongoing pain from 10 to 3.   The patient was seen and examined in the emergency room by Dr. Valetta Close, fellow for Dr. Rollene Rotunda.  The patient was started on  Lovenox, along with beta-blocker and continuation of ACE inhibitor and  he was also started on nitroglycerin drip for blood pressure control.  The patient was admitted to rule out MI and management of hypertension.  Blood pressure was 142/81 and his heart rate was 110.  The patient's  initial troponin was less than 0.05 with an NP of 1.5.   The patient was admitted to ICU and was seen and examined by Dr. Charlton Haws the following day.  The patient had EKGs completed revealing no  acute changes, however, with ongoing chest discomfort in patient  with  known coronary artery disease it was found that the patient be followed  up with cardiac catheterization and he was kept over the weekend to do  so.  In the interim, the patient was seen by medicine to evaluate and  monitor patient's diabetes.   The patient continued to be stable throughout the weekend without any  recurrence of chest pain.  He was also started on Integrilin on  admission as well and tolerated it well with no frank bleeding noted.   The patient had cardiac catheterization completed by Dr. Simona Huh on  November 07, 2007, with results as described above.  Please see Dr.  Ival Bible further note for more details and was found necessary to  intervene on the right coronary artery and left circumflex secondary to  severe occlusions in the 99% range in circumflex, along with 99% range  in the LAD.  Therefore, Dr. Tonny Bollman was requested to see the  patient and intervention was found to be of benefit for the patient on  review of films.  The patient did have successful PCI with 2 Cypher drug-  eluting stents to the left circumflex and LAD with post dilatation   arrhythmias noted with some nonsustained V-tach and SVT noted.  The  patient did settle down over the following 24 hours and remained in  sinus rhythm at that time.   On day of discharge, the patient was seen and examined by Dr. Olga Millers and found to be stable.  The patient was instituted on Zocor 40  mg along with his Tricor with followup lipids and LFTs in 6 weeks.  The  patient is also to follow up with his primary care physician, Dr.  Eloise Harman, for diabetes management.  The patient's blood pressure was  found to be 121/51 with a heart rate of 67 and O2 sat of 96% on room air  with patient's temperature of 98.2.  The right groin site was examined  by Dr. Jens Som and found to be stable and clean without evidence of  hematoma, bleeding, or infection.   DISCHARGE LABS:  Sodium 137, potassium 3.9, chloride 108, CO2 of 25,  glucose 98, BUN 11, creatinine 1.19, hemoglobin 11.4, hematocrit 32.9,  white blood cells 5.6, platelets 150, troponin was found to be 2.11,  2.25, and 0.65 respectively, TSH 3.241, cholesterol 155, lipids 75, HDL  28, LDL 112.   DISCHARGE MEDICATIONS:  1. Atenolol 50 mg 1 p.o. daily.  2. Aspirin 325 mg daily.  3. Simvastatin 40 mg daily.  4. Lisinopril 40 mg daily.  5. Actos 45 mg daily.  6. Plavix 75 mg daily.  7. Nitroglycerin 0.4 mg as needed under tongue p.r.n. chest      discomfort.  8. Tricor 145 mg daily.   ALLERGIES:  NO KNOWN DRUG ALLERGIES.   FOLLOWUP PLANS AND APPOINTMENT:  1. The patient is to follow up with Dr. Olga Millers on Tuesday,      February 24, at 2:30 p.m.  2. The patient has been given post cardiac catheterization      instructions with particular emphasis on the right groin site for      evidence of bleeding, hematoma, signs of infection, or severe pain.  3. The patient is to follow up with primary care physician, Dr.      Eloise Harman, for continued management of diabetes.  4. The patient has been provided instructions for  need to repeat      cholesterol and liver function  labs in 6 weeks which can be done      through our office or through Dr. Silvano Rusk office.   TIME SPENT WITH THE PATIENT:  To include physician time, 40 minutes.      Bettey Mare. Lyman Bishop, NP      Madolyn Frieze. Jens Som, MD, [facility]  Electronically Signed    KML/MEDQ  D:  11/08/2007  T:  11/09/2007  Job:  [id number]   cc:   Barry Dienes. Eloise Harman, M.D.

## 2011-02-10 NOTE — Assessment & Plan Note (Signed)
His blood pressure is fairly well controlled. I have encouraged him to continue his current medications and maintain a low-sodium diet. I've asked him to lose weight. I'm concerned about the possibility of sleep apnea however the patient is not willing to undergo testing at this time.

## 2011-02-10 NOTE — Cardiovascular Report (Signed)
NAMEBRITTEN, Joshua [name]                ACCOUNT NO.:  [id number]   MEDICAL RECORD NO.:  [id number]          PATIENT TYPE:  INP   LOCATION:  3714                         FACILITY:  [facility]   PHYSICIAN:  Jonelle Sidle, MD DATE OF BIRTH:  27-Mar-1951   DATE OF PROCEDURE:  DATE OF DISCHARGE:                            CARDIAC CATHETERIZATION   REQUESTING CARDIOLOGIST:  Noralyn Pick. Eden Emms, MD, [facility].   PRIMARY CARE PHYSICIAN:  Dr. Ivery Quale.   INDICATION:  Joshua [name] is a very pleasant 60 year old male with a  history of type 2 diabetes mellitus, hypertension, and hyperlipidemia.  He has been experiencing symptoms consistent with unstable angina over  the last few weeks and presented with evidence of a non-ST-elevation  myocardial infarction.  He is referred now for a diagnostic cardiac  catheterization to assess the coronary anatomy and evaluate for any  potential revascularization options.  The potential risks and benefits  were explained to him in advance, and informed consent was obtained.   PROCEDURES PERFORMED:  1. Left heart catheterization.  2. Selective coronary angiography.  3. Left ventriculography.   ACCESS AND EQUIPMENT:  The area about the right femoral artery was  anesthetized with 1% lidocaine, and a 6-French sheath was placed in the  right femoral artery via the modified Seldinger technique.  Standard  preformed 6-French JL-4 and JR-4 catheters were used in selective  coronary geography and an angled pigtail catheter was used for left  heart catheterization and left ventriculography.  A total of 120 mL of  Omnipaque was used.  All exchanges were made over wire, and the patient  tolerated the procedure well without any immediate complications.   HEMODYNAMICS:  Aorta 139/78 mmHg.  Left ventricle 137/18 mmHg.   ANGIOGRAPHIC FINDINGS:  1. The left main coronary artery gives rise to the left anterior      descending and circumflex vessels.  There is no significant  flow-      limiting coronary atherosclerosis noted.  2. The left anterior descending is a medium-caliber vessel with 2      diagonal branches.  There is an eccentric 75% to 80% stenosis      within the proximal left anterior descending.  There is a first      diagonal with takeoff in this region that appears to be subtotally      occluded with some reconstitution distally in its bifurcation.  3. Circumflex coronary artery is medium in caliber.  There is a small      first obtuse marginal followed by a very large second obtuse      marginal and then a third more distal obtuse marginal.  Within the      distal portion of the circumflex around the takeoff of the third      obtuse marginal was a 99% stenosis.  4. The right coronary artery is a medium-to-large caliber dominant      vessel with large posterior descending branch.  There is ectatic      diffuse disease within the proximal to midportion of the vessel      with stenoses  in the 50% range.  There is 40% to 50% stenosis in      the mid-to-distal segment.  5. Left ventriculography was performed in the RAO projection,      revealed an ejection fraction approximately 65% to 70% with no      focal wall motion abnormality.   DIAGNOSES:  1. Essentially 2-vessel obstructive coronary disease with a 75% to 80%      eccentric proximal left anterior descending stenosis, 99% distal      circumflex stenosis, and otherwise moderate disease within the      right coronary artery.  There is a first diagonal branch that is      subtotally occluded in the region of the proximal left anterior      descending stenosis.  Also noted is some right-to-left collateral      filling of the distal circumflex vessel.  2. Left ventricular ejection fraction approximately 65% to 70% with no      focal wall motion abnormality and a left ventricular end-diastolic      pressure of 18 mmHg.   DISCUSSION:  I reviewed the results with the patient and discussed the   films with Dr. [name].  At this point would anticipate percutaneous  intervention to address the left anterior descending and circumflex  vessels with otherwise medical therapy.      Jonelle Sidle, MD  Electronically Signed     SGM/MEDQ  D:  11/07/2007  T:  11/08/2007  Job:  [id number]   cc:   Noralyn Pick. Eden Emms, MD, [facility]  Dr. Ivery Quale

## 2011-02-10 NOTE — Assessment & Plan Note (Signed)
[facility]                            CARDIOLOGY OFFICE NOTE   Joshua [name], Joshua [name]                       MRN:          [id number]  DATE:11/08/2008                            DOB:          1951-06-08    Joshua [name] is a pleasant gentleman who has a history of diabetes,  hypertension, hyperlipidemia, and coronary artery disease.  In February  2009, he had PCI of his circumflex and proximal LAD using Cypher drug-  eluting stents.  Since I last saw him on May 21, 2008, he is doing  well from a symptomatic standpoint.  He denies any dyspnea, chest pain,  palpitations, or syncope.  There is no pedal edema.   MEDICATIONS:  1. Atenolol 50 mg p.o. daily.  2. Quinapril 40 mg p.o. daily.  3. Plavix 75 mg p.o. daily.  4. Actos 45 mg p.o. daily.  5. TriCor 145 mg p.o. daily.  6. Lipitor 40 mg p.o. daily.  7. Aspirin 81 mg p.o. daily.   PHYSICAL EXAMINATION:  VITAL SIGNS:  Blood pressure of 130/70 and his  pulse is 57.  He weighs 158 pounds.  HEENT:  Normal.  NECK:  Supple.  CHEST:  Clear.  CARDIOVASCULAR:  Regular rate.  ABDOMEN:  No tenderness.  EXTREMITIES:  No edema.   His electrocardiogram shows sinus rhythm at a rate of 57.  The axis is  normal.  There are no ST changes noted.   DIAGNOSES:  1. Coronary artery disease - Joshua [name] is doing well from a      symptomatic standpoint with no chest pain or shortness of breath.      He will continue his aspirin, Plavix, angiotensin-converting enzyme      inhibitor, beta-blocker, and statin.  He will continue with his      diet and exercise, and he does not smoke.  2. Hyperlipidemia - he will continue on his statin.  We will check      lipids and liver and adjust as indicated.  3. Hypertension - his blood pressure is adequately controlled on his      present medications.  4. History of mild renal insufficiency - we will plan to repeat his      BMET.  If his BUN and creatinine remain mildly elevated,  then it      may be worthwhile to proceed with an ultrasound of his kidneys.      Some of this may be related to his diabetes and hypertension.  5. Diabetes mellitus - management per primary care.   He will see me back in 9 months.     Madolyn Frieze Jens Som, MD, [facility]  Electronically Signed    BSC/MedQ  DD: 11/08/2008  DT: 11/09/2008  Job #: [id number]   cc:   Barry Dienes. Eloise Harman, M.D.

## 2011-02-10 NOTE — Assessment & Plan Note (Signed)
He denies anginal symptoms. He will continue his current medications. 

## 2011-02-10 NOTE — Progress Notes (Signed)
HPI [name] returns today for followup. He is a pleasant 60 year old man with a history of atrial fibrillation, SVT status post ablation, coronary disease, and hypertension. When I last saw the patient, he was complaining of more palpitations and a cardiac monitor had demonstrated atrial fibrillation and flutter. At that time I recommended adding digoxin. Approximately 4 weeks after adding digoxin, the patient noted that his symptoms improved. Since then he has had very few palpitations. He denies chest pain or peripheral edema. He notes that his wife tells him that he snores at nighttime. Allergies  Allergen Reactions  . Niacin      Current Outpatient Prescriptions  Medication Sig Dispense Refill  . aspirin 81 MG tablet Take 81 mg by mouth daily.        Marland Kitchen atenolol (TENORMIN) 50 MG tablet Take 25 mg by mouth 2 (two) times daily.       Marland Kitchen atorvastatin (LIPITOR) 40 MG tablet TAKE 1 TABLET BY MOUTH EVERY DAY  30 tablet  11  . dabigatran (PRADAXA) 150 MG CAPS Take 150 mg by mouth every 12 (twelve) hours.        . digoxin (LANOXIN) 0.25 MG tablet Take 250 mcg by mouth daily.        . fenofibrate micronized (LOFIBRA) 200 MG capsule Take 200 mg by mouth daily before breakfast.        . nitroGLYCERIN (NITROSTAT) 0.4 MG SL tablet Place 0.4 mg under the tongue every 5 (five) minutes as needed.        . pioglitazone (ACTOS) 45 MG tablet Take 45 mg by mouth. Monday, Wednesday, Friday      . quinapril (ACCUPRIL) 40 MG tablet Take 40 mg by mouth at bedtime.           Past Medical History  Diagnosis Date  . CAD (coronary artery disease)   . Hyperlipidemia   . HTN (hypertension)   . DM (diabetes mellitus)   . Renal insufficiency     ROS:   All systems reviewed and negative except as noted in the HPI.   Past Surgical History  Procedure Date  . Cardiac catheterization   . Electrophysiologic study and rf catheter ablation   of av node reentrant tachycardia.      Family History  Problem  Relation Age of Onset  . Hypertension Mother   . Gout Mother   . Alzheimer's disease Mother   . Heart failure Father   . Heart disease Brother   . Cancer Brother      History   Social History  . Marital Status: Married    Spouse Name: N/A    Number of Children: N/A  . Years of Education: N/A   Occupational History  . Not on file.   Social History Main Topics  . Smoking status: Never Smoker   . Smokeless tobacco: Not on file  . Alcohol Use: No  . Drug Use: No  . Sexually Active: Not on file   Other Topics Concern  . Not on file   Social History Narrative  . No narrative on file     BP 144/82  Pulse 49  Ht 5\' 8"  (1.727 m)  Wt 263 lb (119.296 kg)  BMI 39.99 kg/m2  Physical Exam:  Well appearing NAD HEENT: Unremarkable Neck:  No JVD, no thyromegally Lymphatics:  No adenopathy Back:  No CVA tenderness Lungs:  Clear HEART:  Regular bradycardia, no murmurs, no rubs, no clicks Abd:  Flat, positive bowel sounds, no  organomegally, no rebound, no guarding Ext:  2 plus pulses, no edema, no cyanosis, no clubbing Skin:  No rashes no nodules Neuro:  CN II through XII intact, motor grossly intact  EKG Sinus bradycardia  Assess/Plan:

## 2011-02-16 ENCOUNTER — Encounter: Payer: Self-pay | Admitting: Internal Medicine

## 2011-05-11 ENCOUNTER — Other Ambulatory Visit: Payer: Self-pay | Admitting: Internal Medicine

## 2011-05-12 ENCOUNTER — Other Ambulatory Visit: Payer: Self-pay | Admitting: *Deleted

## 2011-05-12 MED ORDER — DABIGATRAN ETEXILATE MESYLATE 150 MG PO CAPS: 150.0000 mg | ORAL_CAPSULE | Freq: Two times a day (BID) | ORAL | Status: DC

## 2011-05-12 NOTE — Telephone Encounter (Signed)
prodaxa 150mg  asap pt is out

## 2011-06-09 ENCOUNTER — Other Ambulatory Visit: Payer: Self-pay | Admitting: *Deleted

## 2011-06-09 MED ORDER — DIGOXIN 250 MCG PO TABS: 250.0000 ug | ORAL_TABLET | Freq: Every day | ORAL | Status: DC

## 2011-06-19 LAB — LIPID PANEL
Cholesterol: 155
Total CHOL/HDL Ratio: 5.5

## 2011-06-19 LAB — BASIC METABOLIC PANEL
BUN: 11
CO2: 23
Chloride: 106
Chloride: 108
GFR calc Af Amer: >60
Glucose, Bld: 98
Potassium: 3.9
Potassium: 4.1

## 2011-06-19 LAB — CARDIAC PANEL(CRET KIN+CKTOT+MB+TROPI)
CK, MB: 23 — ABNORMAL HIGH
CK, MB: 25.8 — ABNORMAL HIGH
Relative Index: 9.1 — ABNORMAL HIGH
Relative Index: 9.8 — ABNORMAL HIGH
Total CK: 234 — ABNORMAL HIGH
Total CK: 251 — ABNORMAL HIGH
Total CK: 285 — ABNORMAL HIGH
Total CK: 73
Troponin I: 1.19

## 2011-06-19 LAB — TSH: TSH: 3.241

## 2011-06-19 LAB — CBC
HCT: 32.9 — ABNORMAL LOW
HCT: 39.8
MCHC: 34
MCHC: 34.3
MCHC: 34.3
MCV: 91.1
MCV: 91.5
MCV: 91.8
MCV: 91.8
Platelets: 150
Platelets: 173
Platelets: 183
Platelets: 206
RBC: 4.23
RDW: 13.8
RDW: 13.9
RDW: 14.2
WBC: 4.1
WBC: 4.6
WBC: 6.7

## 2011-06-19 LAB — DIFFERENTIAL
Basophils Absolute: 0
Lymphocytes Relative: 34
Monocytes Absolute: 0.3
Neutro Abs: 2.3

## 2011-06-19 LAB — HEPARIN ANTI-XA: Heparin LMW: 1.35

## 2011-06-19 LAB — COMPREHENSIVE METABOLIC PANEL
Albumin: 3.9
BUN: 24 — ABNORMAL HIGH
Chloride: 109
Creatinine, Ser: 1.31
Total Bilirubin: 0.6

## 2011-06-19 LAB — POCT CARDIAC MARKERS
CKMB, poc: 1.5
Myoglobin, poc: 106
Myoglobin, poc: 56.1
Operator id: 295131
Troponin i, poc: 0.08 — ABNORMAL HIGH

## 2011-06-19 LAB — PROTIME-INR
INR: 0.9
Prothrombin Time: 12.6

## 2011-06-19 LAB — APTT: aPTT: 26

## 2011-07-03 ENCOUNTER — Ambulatory Visit (INDEPENDENT_AMBULATORY_CARE_PROVIDER_SITE_OTHER): Payer: PRIVATE HEALTH INSURANCE | Admitting: Internal Medicine

## 2011-07-03 ENCOUNTER — Encounter: Payer: Self-pay | Admitting: Internal Medicine

## 2011-07-03 DIAGNOSIS — I2510 Atherosclerotic heart disease of native coronary artery without angina pectoris: Secondary | ICD-10-CM

## 2011-07-03 DIAGNOSIS — I4891 Unspecified atrial fibrillation: Secondary | ICD-10-CM

## 2011-07-03 DIAGNOSIS — I10 Essential (primary) hypertension: Secondary | ICD-10-CM

## 2011-07-03 NOTE — Patient Instructions (Signed)
Your physician wants you to follow-up in: 6 MONTHS WITH DR. TAYLOR. You will receive a reminder letter in the mail two months in advance. If you don't receive a letter, please call our office to schedule the follow-up appointment.  Your physician recommends that you continue on your current medications as directed. Please refer to the Current Medication list given to you today.  

## 2011-07-03 NOTE — Assessment & Plan Note (Addendum)
His current symptoms are well controlled. He'll continue his current medications and will undergo a period of watchful waiting. If he has worsening symptoms, we'll consider antiarrhythmic therapy. Today we also discussed anti-coagulation. He will continue his current medications.

## 2011-07-03 NOTE — Assessment & Plan Note (Signed)
His blood pressure is slightly elevated today. He is not checking his blood pressure at home. I've asked him to check it once a day and keep a log. If he is over 135-140 systolic, I have recommended that he have his antihypertensive medications titrated. He will call us or follow up with his primary physician as needed.

## 2011-07-03 NOTE — Assessment & Plan Note (Signed)
He currently denies anginal symptoms. He will continue his current medical therapy. Have asked him to maintain an active exercise program.

## 2011-07-03 NOTE — Progress Notes (Signed)
HPI [name] returns today for followup. He is a pleasant 60 year old man with coronary artery disease and paroxysmal atrial fibrillation, hypertension, obesity, and dyslipidemia. He has done well in the interim. We switched him to take strategy of rate control with digoxin and atenolol several months ago. He is maintaining sinus rhythm very nicely. The patient notes that between one and 3 times a week, she will go out of rhythm for a few minutes at a time. These spells are always short-lived and terminate spontaneously. No associated chest pain, shortness of breath, or peripheral edema. His spells typically occur after he's eaten at night. Allergies  Allergen Reactions  . Niacin      Current Outpatient Prescriptions  Medication Sig Dispense Refill  . aspirin 81 MG tablet Take 81 mg by mouth daily.        Marland Kitchen atenolol (TENORMIN) 50 MG tablet Take 25 mg by mouth 2 (two) times daily.       Marland Kitchen atorvastatin (LIPITOR) 40 MG tablet TAKE 1 TABLET BY MOUTH EVERY DAY  30 tablet  11  . dabigatran (PRADAXA) 150 MG CAPS Take 1 capsule (150 mg total) by mouth every 12 (twelve) hours.  60 capsule  6  . digoxin (LANOXIN) 0.25 MG tablet Take 1 tablet (250 mcg total) by mouth daily.  30 tablet  6  . fenofibrate micronized (LOFIBRA) 200 MG capsule Take 200 mg by mouth daily before breakfast.        . nitroGLYCERIN (NITROSTAT) 0.4 MG SL tablet Place 0.4 mg under the tongue every 5 (five) minutes as needed.        . pioglitazone (ACTOS) 45 MG tablet Take 45 mg by mouth. Monday, Wednesday, Friday      . quinapril (ACCUPRIL) 40 MG tablet Take 40 mg by mouth at bedtime.           Past Medical History  Diagnosis Date  . CAD (coronary artery disease)   . Hyperlipidemia   . HTN (hypertension)   . DM (diabetes mellitus)   . Renal insufficiency     ROS:   All systems reviewed and negative except as noted in the HPI.   Past Surgical History  Procedure Date  . Cardiac catheterization   . Electrophysiologic  study and rf catheter ablation   of av node reentrant tachycardia.      Family History  Problem Relation Age of Onset  . Hypertension Mother   . Gout Mother   . Alzheimer's disease Mother   . Heart failure Father   . Heart disease Brother   . Cancer Brother      History   Social History  . Marital Status: Married    Spouse Name: N/A    Number of Children: N/A  . Years of Education: N/A   Occupational History  . Not on file.   Social History Main Topics  . Smoking status: Never Smoker   . Smokeless tobacco: Not on file  . Alcohol Use: No  . Drug Use: No  . Sexually Active: Not on file   Other Topics Concern  . Not on file   Social History Narrative  . No narrative on file     BP 143/81  Pulse 53  Ht 5\' 8"  (1.727 m)  Wt 257 lb (116.574 kg)  BMI 39.08 kg/m2  Physical Exam:  Well appearing middle-age man, NAD HEENT: Unremarkable Neck:  No JVD, no thyromegally Lymphatics:  No adenopathy Back:  No CVA tenderness Lungs:  Clear with  no wheezes, rales, or rhonchi. HEART:  Regular rate rhythm, no murmurs, no rubs, no clicks Abd:  soft, positive bowel sounds, no organomegally, no rebound, no guarding Ext:  2 plus pulses, no edema, no cyanosis, no clubbing Skin:  No rashes no nodules Neuro:  CN II through XII intact, motor grossly intact  EKG Normal sinus rhythm with left ventricular hypertrophy. Nonspecific ST T wave abnormality.  Assess/Plan:

## 2011-07-09 NOTE — Progress Notes (Signed)
Addended by: Erin Hearing on: 07/09/2011 04:49 PM   Modules accepted: Orders

## 2011-07-28 NOTE — Progress Notes (Signed)
Addended by: Dennis Bast F on: 07/28/2011 04:20 PM   Modules accepted: Orders

## 2011-11-28 ENCOUNTER — Other Ambulatory Visit: Payer: Self-pay | Admitting: Internal Medicine

## 2011-12-26 ENCOUNTER — Other Ambulatory Visit: Payer: Self-pay | Admitting: Cardiology

## 2011-12-26 ENCOUNTER — Other Ambulatory Visit: Payer: Self-pay | Admitting: Internal Medicine

## 2011-12-28 ENCOUNTER — Other Ambulatory Visit: Payer: Self-pay

## 2011-12-28 MED ORDER — DIGOXIN 250 MCG PO TABS: 250.0000 ug | ORAL_TABLET | Freq: Every day | ORAL | Status: DC

## 2012-01-13 ENCOUNTER — Encounter: Payer: Self-pay | Admitting: Internal Medicine

## 2012-01-13 ENCOUNTER — Ambulatory Visit (INDEPENDENT_AMBULATORY_CARE_PROVIDER_SITE_OTHER): Payer: PRIVATE HEALTH INSURANCE | Admitting: Internal Medicine

## 2012-01-13 VITALS — BP 124/88 | HR 74 | Ht 68.0 in | Wt 262.8 lb

## 2012-01-13 DIAGNOSIS — I4891 Unspecified atrial fibrillation: Secondary | ICD-10-CM

## 2012-01-13 DIAGNOSIS — I2510 Atherosclerotic heart disease of native coronary artery without angina pectoris: Secondary | ICD-10-CM

## 2012-01-13 DIAGNOSIS — E785 Hyperlipidemia, unspecified: Secondary | ICD-10-CM

## 2012-01-13 NOTE — Assessment & Plan Note (Signed)
I've discussed the importance of weight loss and increased exercise. He will continue his statin therapy and a low-fat diet.

## 2012-01-13 NOTE — Assessment & Plan Note (Signed)
He denies anginal symptoms. He is well controlled on medical therapy. He has had minimal use of his sublingual nitroglycerin.

## 2012-01-13 NOTE — Assessment & Plan Note (Signed)
He appears to be maintaining sinus rhythm very nicely. He will continue his current medical therapy.

## 2012-01-13 NOTE — Progress Notes (Signed)
HPI Joshua [name] returns today for followup. He is a very pleasant middle-aged man with a history of paroxysmal atrial fibrillation, coronary disease, and dyslipidemia. He has been well controlled over the past 6 months. He notes one or 2 episodes of tachycardia palpitations lasting one to 3 minutes in duration. These stop and start suddenly and are not associated with syncope, near-syncope, chest pain, or shortness of breath. Otherwise he has felt well. Allergies  Allergen Reactions  . Niacin      Current Outpatient Prescriptions  Medication Sig Dispense Refill  . aspirin 81 MG tablet Take 81 mg by mouth daily.        Marland Kitchen atenolol (TENORMIN) 50 MG tablet Take 25 mg by mouth 2 (two) times daily.       Marland Kitchen atorvastatin (LIPITOR) 40 MG tablet TAKE 1 TABLET BY MOUTH EVERY DAY  30 tablet  11  . digoxin (LANOXIN) 0.25 MG tablet Take 1 tablet (250 mcg total) by mouth daily.  30 tablet  1  . fenofibrate micronized (LOFIBRA) 200 MG capsule Take 200 mg by mouth daily before breakfast.        . nitroGLYCERIN (NITROSTAT) 0.4 MG SL tablet Place 0.4 mg under the tongue every 5 (five) minutes as needed.        . pioglitazone (ACTOS) 45 MG tablet Take 45 mg by mouth. Monday, Wednesday, Friday      . PRADAXA 150 MG CAPS TAKE ONE CAPSULE BY MOUTH EVERY 12 HOURS  60 capsule  6  . quinapril (ACCUPRIL) 40 MG tablet Take 40 mg by mouth at bedtime.           Past Medical History  Diagnosis Date  . CAD (coronary artery disease)   . Hyperlipidemia   . HTN (hypertension)   . DM (diabetes mellitus)   . Renal insufficiency   . Gout   . Back pain     ROS:   All systems reviewed and negative except as noted in the HPI.   Past Surgical History  Procedure Date  . Cardiac catheterization 11/07/07  . Electrophysiologic study and rf catheter ablation   of av node reentrant tachycardia.      Family History  Problem Relation Age of Onset  . Hypertension Mother   . Gout Mother   . Alzheimer's disease Mother     . Heart failure Father   . Heart disease Brother   . Cancer Brother      History   Social History  . Marital Status: Married    Spouse Name: N/A    Number of Children: N/A  . Years of Education: N/A   Occupational History  . Not on file.   Social History Main Topics  . Smoking status: Never Smoker   . Smokeless tobacco: Not on file  . Alcohol Use: No  . Drug Use: No  . Sexually Active: Not on file   Other Topics Concern  . Not on file   Social History Narrative  . No narrative on file     BP 124/88  Pulse 74  Ht 5\' 8"  (1.727 m)  Wt 119.205 kg (262 lb 12.8 oz)  BMI 39.96 kg/m2  Physical Exam:  Well appearing obese, middle-aged man, NAD HEENT: Unremarkable Neck:  No JVD, no thyromegally Lungs:  Clear with no wheezes, rales, or rhonchi. HEART:  Regular rate rhythm, no murmurs, no rubs, no clicks Abd:  soft, positive bowel sounds, no organomegally, no rebound, no guarding Ext:  2 plus pulses,  no edema, no cyanosis, no clubbing Skin:  No rashes no nodules Neuro:  CN II through XII intact, motor grossly intact   Assess/Plan:

## 2012-02-29 ENCOUNTER — Other Ambulatory Visit: Payer: Self-pay | Admitting: Internal Medicine

## 2012-02-29 NOTE — Telephone Encounter (Signed)
..   Requested Prescriptions   Pending Prescriptions Disp Refills  . digoxin (LANOXIN) 0.25 MG tablet [Pharmacy Med Name: DIGOXIN 250 MCG TABLET] 30 tablet 6    Sig: TAKE 1 TABLET BY MOUTH EVERY DAY

## 2012-06-11 ENCOUNTER — Other Ambulatory Visit: Payer: Self-pay | Admitting: Cardiology

## 2012-06-13 ENCOUNTER — Other Ambulatory Visit: Payer: Self-pay | Admitting: *Deleted

## 2012-06-13 MED ORDER — ATENOLOL 50 MG PO TABS: 25.0000 mg | ORAL_TABLET | Freq: Two times a day (BID) | ORAL | Status: DC

## 2012-06-28 ENCOUNTER — Other Ambulatory Visit: Payer: Self-pay | Admitting: Internal Medicine

## 2012-07-11 ENCOUNTER — Other Ambulatory Visit: Payer: Self-pay | Admitting: Internal Medicine

## 2012-09-25 ENCOUNTER — Other Ambulatory Visit: Payer: Self-pay | Admitting: Internal Medicine

## 2012-09-27 ENCOUNTER — Other Ambulatory Visit: Payer: Self-pay | Admitting: Cardiology

## 2012-09-27 NOTE — Telephone Encounter (Signed)
A user error has taken place: orders placed in error, not carried out on this patient.

## 2012-12-24 ENCOUNTER — Other Ambulatory Visit: Payer: Self-pay | Admitting: Cardiology

## 2013-01-30 ENCOUNTER — Other Ambulatory Visit: Payer: Self-pay | Admitting: Emergency Medicine

## 2013-01-30 MED ORDER — DABIGATRAN ETEXILATE MESYLATE 150 MG PO CAPS: ORAL_CAPSULE | ORAL | Status: DC

## 2013-02-12 ENCOUNTER — Other Ambulatory Visit: Payer: Self-pay | Admitting: Internal Medicine

## 2013-04-04 ENCOUNTER — Ambulatory Visit (INDEPENDENT_AMBULATORY_CARE_PROVIDER_SITE_OTHER): Payer: BC Managed Care – PPO | Admitting: Internal Medicine

## 2013-04-04 ENCOUNTER — Encounter: Payer: Self-pay | Admitting: Internal Medicine

## 2013-04-04 VITALS — BP 130/72 | HR 53 | Ht 68.0 in | Wt 236.4 lb

## 2013-04-04 DIAGNOSIS — I10 Essential (primary) hypertension: Secondary | ICD-10-CM

## 2013-04-04 DIAGNOSIS — I4891 Unspecified atrial fibrillation: Secondary | ICD-10-CM

## 2013-04-04 MED ORDER — DABIGATRAN ETEXILATE MESYLATE 150 MG PO CAPS: ORAL_CAPSULE | ORAL | Status: DC

## 2013-04-04 NOTE — Assessment & Plan Note (Signed)
His blood pressure is well controlled. I suspect she will need less blood pressure medication, if he continues to lose weight.

## 2013-04-04 NOTE — Progress Notes (Signed)
HPI Joshua [name] returns today for followup. He is a very pleasant 62 year old man with a history of paroxysmal atrial fibrillation, years after successful ablation of SVT. In the interim, he has been stable. He has palpitations which occur once or twice a month, and last 2-3 minutes at a time. It is thought that these symptoms represent atrial fibrillation. He denies chest pain, shortness of breath, or peripheral edema. He has lost approximately 30 pounds in the past 3 months. He denies syncope.  Allergies  Allergen Reactions  . Niacin      Current Outpatient Prescriptions  Medication Sig Dispense Refill  . aspirin 81 MG tablet Take 81 mg by mouth daily.        Marland Kitchen atenolol (TENORMIN) 50 MG tablet Take 0.5 tablets (25 mg total) by mouth 2 (two) times daily.  60 tablet  12  . atorvastatin (LIPITOR) 40 MG tablet TAKE 1 TABLET BY MOUTH EVERY DAY  30 tablet  11  . dabigatran (PRADAXA) 150 MG CAPS TAKE ONE CAPSULE BY MOUTH EVERY 12 HOURS  60 capsule  11  . digoxin (LANOXIN) 0.25 MG tablet Take 0.25 mg by mouth daily.      . fenofibrate micronized (LOFIBRA) 200 MG capsule Take 200 mg by mouth daily before breakfast.        . nitroGLYCERIN (NITROSTAT) 0.4 MG SL tablet Place 0.4 mg under the tongue every 5 (five) minutes as needed.        . pioglitazone (ACTOS) 45 MG tablet Take 45 mg by mouth. Monday, Wednesday, Friday      . quinapril (ACCUPRIL) 40 MG tablet Take 40 mg by mouth at bedtime.         No current facility-administered medications for this visit.     Past Medical History  Diagnosis Date  . CAD (coronary artery disease)   . Hyperlipidemia   . HTN (hypertension)   . DM (diabetes mellitus)   . Renal insufficiency   . Gout   . Back pain     ROS:   All systems reviewed and negative except as noted in the HPI.   Past Surgical History  Procedure Laterality Date  . Cardiac catheterization  11/07/07  . Electrophysiologic study and rf catheter ablation   of av node reentrant  tachycardia.       Family History  Problem Relation Age of Onset  . Hypertension Mother   . Gout Mother   . Alzheimer's disease Mother   . Heart failure Father   . Heart disease Brother   . Cancer Brother      History   Social History  . Marital Status: Married    Spouse Name: N/A    Number of Children: N/A  . Years of Education: N/A   Occupational History  . Not on file.   Social History Main Topics  . Smoking status: Never Smoker   . Smokeless tobacco: Not on file  . Alcohol Use: No  . Drug Use: No  . Sexually Active: Not on file   Other Topics Concern  . Not on file   Social History Narrative  . No narrative on file     BP 130/72  Pulse 53  Ht 5\' 8"  (1.727 m)  Wt 236 lb 6.4 oz (107.23 kg)  BMI 35.95 kg/m2  Physical Exam:  Well appearing 62 year old man, NAD HEENT: Unremarkable Neck:  7 cm JVD, no thyromegally Lungs:  Clear with no wheezes, rales, or rhonchi. HEART:  Regular rate rhythm, no  murmurs, no rubs, no clicks Abd:  soft, positive bowel sounds, no organomegally, no rebound, no guarding Ext:  2 plus pulses, no edema, no cyanosis, no clubbing Skin:  No rashes no nodules Neuro:  CN II through XII intact, motor grossly intact  EKG - sinus bradycardia with left ventricular hypertrophy   Assess/Plan:

## 2013-04-04 NOTE — Patient Instructions (Addendum)
**Note De-identified Yaritsa Savarino Obfuscation** Your physician recommends that you continue on your current medications as directed. Please refer to the Current Medication list given to you today.  Your physician wants you to follow-up in: 1 year. You will receive a reminder letter in the mail two months in advance. If you don't receive a letter, please call our office to schedule the follow-up appointment.  

## 2013-04-04 NOTE — Assessment & Plan Note (Signed)
He is maintaining sinus rhythm for the most part. At this point, his palpitations are not severe enough to recommend antiarrhythmic drug therapy. He will continue his beta blocker and digoxin. He will continue systemic anticoagulation with Pradaxa. I plan to see him back in one year.

## 2013-08-02 ENCOUNTER — Other Ambulatory Visit: Payer: Self-pay

## 2013-08-02 MED ORDER — ATENOLOL 50 MG PO TABS: 25.0000 mg | ORAL_TABLET | Freq: Two times a day (BID) | ORAL | Status: DC

## 2013-11-25 ENCOUNTER — Other Ambulatory Visit: Payer: Self-pay | Admitting: Internal Medicine

## 2013-12-26 ENCOUNTER — Other Ambulatory Visit: Payer: Self-pay

## 2013-12-26 MED ORDER — ATORVASTATIN CALCIUM 40 MG PO TABS: ORAL_TABLET | ORAL | Status: DC

## 2014-01-17 ENCOUNTER — Encounter: Payer: Self-pay | Admitting: Internal Medicine

## 2014-01-19 ENCOUNTER — Ambulatory Visit (INDEPENDENT_AMBULATORY_CARE_PROVIDER_SITE_OTHER): Payer: Medicare HMO | Admitting: Physician Assistant

## 2014-01-19 ENCOUNTER — Encounter: Payer: Self-pay | Admitting: Physician Assistant

## 2014-01-19 ENCOUNTER — Telehealth: Payer: Self-pay | Admitting: *Deleted

## 2014-01-19 VITALS — BP 132/70 | HR 52 | Ht 68.0 in | Wt 238.2 lb

## 2014-01-19 DIAGNOSIS — R195 Other fecal abnormalities: Secondary | ICD-10-CM

## 2014-01-19 DIAGNOSIS — K5730 Diverticulosis of large intestine without perforation or abscess without bleeding: Secondary | ICD-10-CM

## 2014-01-19 DIAGNOSIS — K625 Hemorrhage of anus and rectum: Secondary | ICD-10-CM

## 2014-01-19 DIAGNOSIS — K648 Other hemorrhoids: Secondary | ICD-10-CM

## 2014-01-19 MED ORDER — MOVIPREP 100 G PO SOLR: 1.0000 | ORAL | Status: DC

## 2014-01-19 NOTE — Patient Instructions (Signed)

## 2014-01-19 NOTE — Telephone Encounter (Signed)
/  24/2015   RE: Joshua [name] DOB: [birth year] MRN: [id number]   Dear Dr. Cristopher Peru,    We have scheduled the above patient for an endoscopic procedure. Our records show that he is on anticoagulation therapy.   Please advise as to how long the patient may come off his therapy of Pradaxa prior to the procedure, which is scheduled for 01-30-2014. We recommend the patient hold this medication for 3 days prior to the procedure date.  Please fax back/ or route the completed form to [place] at [phone].   Sincerely,    Amy Esterwood PA-C    Litisha Guagliardo NCMA

## 2014-01-19 NOTE — Progress Notes (Signed)
Subjective:    Patient ID: Joshua [name], male    DOB: 11-29-1950, 63 y.o.   MRN: [id number]  HPI  [place]  Is a 63 year old white male known to Dr. Scarlette Shorts who is referred today by Dr. Bevelyn Buckles for positive Hemosure. Patient states that he had a recent physical and then did Hemoccult cards. Patient has had prior colonoscopy in March of 2011 with finding of an internal hemorrhoid, diverticulosis and the a descending colon and at that time apparently had an anal fissure. He has history of atrial fibrillation, is maintained on Permax, also has coronary artery disease with remote stents, adult onset diabetes mellitus hypertension and hyperlipidemia. He is followed by Dr. Crissie Sickles. Patient says he has no complaints of changes in his bowel habits abdominal pain, melena or hematochezia. He says he has noted small amounts of bright red blood with bowel movements periodically over the past 2 years which she has attributed to hemorrhoids. He says he has an external hemorrhoid that he is aware of all time and he feels that he intermittently bleeds from this. He does not remember whether he had noticed any bleeding around the time that he did Hemoccult cards or not.    Review of Systems  Constitutional: Negative.   HENT: Negative.   Eyes: Negative.   Respiratory: Negative.   Cardiovascular: Negative.   Gastrointestinal: Positive for anal bleeding.  Endocrine: Negative.   Genitourinary: Negative.   Allergic/Immunologic: Negative.   Neurological: Negative.   Hematological: Negative.   Psychiatric/Behavioral: Negative.    Outpatient Prescriptions Prior to Visit  Medication Sig Dispense Refill  . aspirin 81 MG tablet Take 81 mg by mouth daily.        Marland Kitchen atenolol (TENORMIN) 50 MG tablet Take 0.5 tablets (25 mg total) by mouth 2 (two) times daily.  60 tablet  12  . atorvastatin (LIPITOR) 40 MG tablet TAKE 1 TABLET BY MOUTH EVERY DAY  30 tablet  6  . dabigatran (PRADAXA) 150 MG CAPS TAKE ONE  CAPSULE BY MOUTH EVERY 12 HOURS  60 capsule  11  . digoxin (LANOXIN) 0.25 MG tablet TAKE 1 TABLET BY MOUTH EVERY DAY  30 tablet  3  . fenofibrate micronized (LOFIBRA) 200 MG capsule Take 200 mg by mouth daily before breakfast.        . nitroGLYCERIN (NITROSTAT) 0.4 MG SL tablet Place 0.4 mg under the tongue every 5 (five) minutes as needed.        . pioglitazone (ACTOS) 45 MG tablet Take 45 mg by mouth. Wednesday      . quinapril (ACCUPRIL) 40 MG tablet Take 40 mg by mouth at bedtime.        . digoxin (LANOXIN) 0.25 MG tablet Take 0.25 mg by mouth daily.       No facility-administered medications prior to visit.   Allergies  Allergen Reactions  . Niacin        Patient Active Problem List   Diagnosis Date Noted  . Internal hemorrhoids 01/19/2014  . Diverticulosis of colon without hemorrhage 01/19/2014  . ATRIAL FIBRILLATION 04/29/2010  . PALPITATIONS 04/21/2010  . HEMORRHOIDS-EXTERNAL 11/01/2009  . HYPERKALEMIA 09/09/2009  . DM 08/16/2009  . HYPERLIPIDEMIA 08/16/2009  . HYPERTENSION 08/16/2009  . CAD 08/16/2009  . RENAL INSUFFICIENCY 08/16/2009   History  Substance Use Topics  . Smoking status: Never Smoker   . Smokeless tobacco: Never Used  . Alcohol Use: No   family history includes Alzheimer's disease in his mother; Cancer  in his brother; Gout in his mother; Heart disease in his brother; Heart failure in his father; Hypertension in his mother.  Objective:   Physical Exam well-developed older white male in no acute distress, pleasant blood pressure 132/70 pulse 52 height 5 foot 8 weight 238. HEENT; nontraumatic, normocephalic ,EOMI PERRLA sclera anicteric, Supple ;no JVD, Cardiovascular;-irregular rate and rhythm with S1-S2 no murmur rub or gallop, Pulmonary; clear bilaterally, Abdomen; soft nontender, nondistended, bowel sounds are active there is no palpable mass or hepatosplenomegaly, Rectal ;exam not done, Extremities; no clubbing cyanosis or edema skin warm and dry,  Psych; mood and affect appropriate       Assessment & Plan:  #65  63 year old male with Hemoccult positive stool. He is asymptomatic other than noting intermittent small-volume hematochezia over the past few years attributed to an external  Hemorrhoid. Will need to rule out occult colon lesion versus Hemoccult-positive secondary to hemorrhoids #2 coronary artery disease #3 chronic anti-coagulation with Pradaxa #4 history of atrial fibrillation #5 hypertension #6 hyperlipidemia #7 adult-onset diabetes mellitus  Plan; will schedule for colonoscopy with Dr. Henrene Pastor. Procedure discussed in detail with patient he is agreeable to proceed. He is interested in having treatment for his hemorrhoids if found at colonoscopy. I explained this would be done at a separate time and likely he would be referred for an office banding with Dr. Carlean Purl if bleeding felt secondary to internal hemorrhoids. Patient will need to hold Pradaxa  For  3 days prior to his procedure. We will obtain consent from Dr. Crissie Sickles his cardiologist.

## 2014-01-20 NOTE — Progress Notes (Signed)
Agree with assessment and plans as outlined 

## 2014-01-24 ENCOUNTER — Ambulatory Visit: Payer: Medicare HMO | Admitting: Physician Assistant

## 2014-01-24 ENCOUNTER — Telehealth: Payer: Self-pay | Admitting: Internal Medicine

## 2014-01-24 NOTE — Telephone Encounter (Signed)
Spoke with Pam at [facility] and told her I would forward this note to Dr. Lovena Le and his nurse- Claiborne Billings.  Also see previous phone note from Rains, PA/Pam [place].

## 2014-01-24 NOTE — Telephone Encounter (Signed)
New Message:  Joshua [name] is calling to see if the pt can hold his prodaxa for 3 days prior to his colonoscopy procedure on May 5th. Requests a call back

## 2014-01-26 NOTE — Telephone Encounter (Signed)
I spoke to the patient and he said we told him to stop the Pradaxa 5 days before the procedure, so he stopped it on 4-30. I asked him when we told him that and he said when we went over his colonoscopy instructions. . At the risk of arguing with the patient I told him I would call Dr. Tanna Furry office and verify his Pradaxa instructions.  We did not give him instructions on the Pradaxa because we just heard from Dr. Lovena Le today 01-26-2014).  Janan Halter RN , who works with Dr. Lovena Le told me to have the patient take the Pradaxa today 5-1, tomorrow 5-2, and stop it again on Sunday 5-3, stay off 5-4 and 5-5. He will resume it on Wed 5-6 unless otherwise instructed.  The patient verbalized understanding the instructions.

## 2014-01-26 NOTE — Telephone Encounter (Signed)
Pam, Amy and Pat. The half life of Pradaxa is 12 hours. The peak action of onset is 2-4 hours. I would recommend holding the drug for 2 days prior to colonoscopy. I would suggest your GI MD determine the appropriate time for which it would be appropriate to hold and restart Pradaxa after colonoscopy depending on what they do to the patient at the time of the procedure. Gregg Taylor 

## 2014-01-30 ENCOUNTER — Encounter: Payer: Self-pay | Admitting: Internal Medicine

## 2014-01-30 ENCOUNTER — Ambulatory Visit (AMBULATORY_SURGERY_CENTER): Payer: Medicare HMO | Admitting: Internal Medicine

## 2014-01-30 VITALS — BP 139/76 | HR 54 | Temp 98.2°F | Resp 16 | Ht 68.0 in | Wt 238.0 lb

## 2014-01-30 DIAGNOSIS — D126 Benign neoplasm of colon, unspecified: Secondary | ICD-10-CM

## 2014-01-30 DIAGNOSIS — K5730 Diverticulosis of large intestine without perforation or abscess without bleeding: Secondary | ICD-10-CM

## 2014-01-30 DIAGNOSIS — R195 Other fecal abnormalities: Secondary | ICD-10-CM

## 2014-01-30 LAB — GLUCOSE, CAPILLARY
Glucose-Capillary: 112 mg/dL — ABNORMAL HIGH (ref 70–99)
Glucose-Capillary: 104 mg/dL — ABNORMAL HIGH (ref 70–99)

## 2014-01-30 MED ORDER — SODIUM CHLORIDE 0.9 % IV SOLN: 500.0000 mL | INTRAVENOUS | Status: DC

## 2014-01-30 NOTE — Op Note (Signed)
[facility]  [address] [place], [id number]   COLONOSCOPY PROCEDURE REPORT  PATIENT: Joshua [name], Joshua [name]  MR#: [id number] BIRTHDATE: [birth year] , 89  yrs. old GENDER: Male ENDOSCOPIST: Eustace Quail, MD REFERRED OM:[facility] Philip Aspen, M.D. PROCEDURE DATE:  01/30/2014 PROCEDURE:   Colonoscopy with snare polypectomy x 2 First Screening Colonoscopy - Avg.  risk and is 50 yrs.  old or older - No.  Prior Negative Screening - Now for repeat screening. N/A  History of Adenoma - Now for follow-up colonoscopy & has been > or = to 3 yrs.  N/A  Polyps Removed Today? Yes. ASA CLASS:   Class III INDICATIONS:heme-positive stool.   Prior screening exam 11-2009 (no polyps) MEDICATIONS: MAC sedation, administered by CRNA and propofol (Diprivan) 300mg  IV  DESCRIPTION OF PROCEDURE:   After the risks benefits and alternatives of the procedure were thoroughly explained, informed consent was obtained.  A digital rectal exam revealed no abnormalities of the rectum.   The LB [id number]  endoscope was introduced through the anus and advanced to the cecum, which was identified by both the appendix and ileocecal valve. No adverse events experienced.   The quality of the prep was excellent, using MoviPrep  The instrument was then slowly withdrawn as the colon was fully examined.  COLON FINDINGS: Two diminutive polyps were found in the rectum.  A polypectomy was performed with a cold snare.  The resection was complete and the polyp tissue was completely retrieved.   Mild diverticulosis was noted in the sigmoid colon.   The colon mucosa was otherwise normal.  Retroflexed views revealed internal hemorrhoids. The time to cecum=1 minutes 31 seconds.  Withdrawal time=13 minutes 29 seconds.  The scope was withdrawn and the procedure completed. COMPLICATIONS: There were no complications.  ENDOSCOPIC IMPRESSION: 1.   Two diminutive polyps were found in the rectum; polypectomy was performed  with a cold snare 2.   Mild diverticulosis was noted in the sigmoid colon 3.   The colon mucosa was otherwise normal  RECOMMENDATIONS: 1.  Repeat colonoscopy in 5 years if polyp adenomatous; otherwise 10 years 2.  Resume Pradaxa today   eSigned:  Eustace Quail, MD 01/30/2014 10:27 AM   cc: Leanna Battles, MD and The Patient

## 2014-01-30 NOTE — Progress Notes (Signed)
Called to room to assist during endoscopic procedure.  Patient ID and intended procedure confirmed with present staff. Received instructions for my participation in the procedure from the performing physician.  

## 2014-01-30 NOTE — Progress Notes (Signed)
Report to pacu rn, vss, bbs=clear 

## 2014-01-30 NOTE — Patient Instructions (Signed)
YOU HAD AN ENDOSCOPIC PROCEDURE TODAY AT THE [facility]: Refer to the procedure report that was given to you for any specific questions about what was found during the examination.  If the procedure report does not answer your questions, please call your gastroenterologist to clarify.  If you requested that your care partner not be given the details of your procedure findings, then the procedure report has been included in a sealed envelope for you to review at your convenience later.  YOU SHOULD EXPECT: Some feelings of bloating in the abdomen. Passage of more gas than usual.  Walking can help get rid of the air that was put into your GI tract during the procedure and reduce the bloating. If you had a lower endoscopy (such as a colonoscopy or flexible sigmoidoscopy) you may notice spotting of blood in your stool or on the toilet paper. If you underwent a bowel prep for your procedure, then you may not have a normal bowel movement for a few days.  DIET: Your first meal following the procedure should be a light meal and then it is ok to progress to your normal diet.  A half-sandwich or bowl of soup is an example of a good first meal.  Heavy or fried foods are harder to digest and may make you feel nauseous or bloated.  Likewise meals heavy in dairy and vegetables can cause extra gas to form and this can also increase the bloating.  Drink plenty of fluids but you should avoid alcoholic beverages for 24 hours.  ACTIVITY: Your care partner should take you home directly after the procedure.  You should plan to take it easy, moving slowly for the rest of the day.  You can resume normal activity the day after the procedure however you should NOT DRIVE or use heavy machinery for 24 hours (because of the sedation medicines used during the test).    SYMPTOMS TO REPORT IMMEDIATELY: A gastroenterologist can be reached at any hour.  During normal business hours, 8:30 AM to 5:00 PM Monday through Friday,  call [phone].  After hours and on weekends, please call the GI answering service at [phone] who will take a message and have the physician on call contact you.   Following lower endoscopy (colonoscopy or flexible sigmoidoscopy):  Excessive amounts of blood in the stool  Significant tenderness or worsening of abdominal pains  Swelling of the abdomen that is new, acute  Fever of 100F or higher    FOLLOW UP: If any biopsies were taken you will be contacted by phone or by letter within the next 1-3 weeks.  Call your gastroenterologist if you have not heard about the biopsies in 3 weeks.  Our staff will call the home number listed on your records the next business day following your procedure to check on you and address any questions or concerns that you may have at that time regarding the information given to you following your procedure. This is a courtesy call and so if there is no answer at the home number and we have not heard from you through the emergency physician on call, we will assume that you have returned to your regular daily activities without incident.  SIGNATURES/CONFIDENTIALITY: You and/or your care partner have signed paperwork which will be entered into your electronic medical record.  These signatures attest to the fact that that the information above on your After Visit Summary has been reviewed and is understood.  Full responsibility of the confidentiality   of this discharge information lies with you and/or your care-partner.     

## 2014-01-31 ENCOUNTER — Telehealth: Payer: Self-pay | Admitting: *Deleted

## 2014-01-31 NOTE — Telephone Encounter (Signed)
  Follow up Call-  Call back number 01/30/2014  Post procedure Call Back phone  # [phone]  Permission to leave phone message Yes     Patient questions:  Do you have a fever, pain , or abdominal swelling? no Pain Score  0 *  Have you tolerated food without any problems? yes  Have you been able to return to your normal activities? yes  Do you have any questions about your discharge instructions: Diet   no Medications  no Follow up visit  no  Do you have questions or concerns about your Care? no  Actions: * If pain score is 4 or above: No action needed, pain <4.

## 2014-02-06 ENCOUNTER — Encounter: Payer: Self-pay | Admitting: Internal Medicine

## 2014-02-21 ENCOUNTER — Encounter: Payer: Self-pay | Admitting: Internal Medicine

## 2014-03-20 ENCOUNTER — Other Ambulatory Visit: Payer: Self-pay | Admitting: Internal Medicine

## 2014-03-29 ENCOUNTER — Ambulatory Visit: Payer: Medicare HMO | Admitting: Internal Medicine

## 2014-04-02 ENCOUNTER — Other Ambulatory Visit: Payer: Self-pay

## 2014-04-04 ENCOUNTER — Ambulatory Visit (INDEPENDENT_AMBULATORY_CARE_PROVIDER_SITE_OTHER): Payer: Medicare HMO | Admitting: Internal Medicine

## 2014-04-04 ENCOUNTER — Encounter: Payer: Self-pay | Admitting: Internal Medicine

## 2014-04-04 VITALS — BP 150/75 | HR 51 | Ht 68.0 in | Wt 246.0 lb

## 2014-04-04 DIAGNOSIS — I480 Paroxysmal atrial fibrillation: Secondary | ICD-10-CM

## 2014-04-04 DIAGNOSIS — I4891 Unspecified atrial fibrillation: Secondary | ICD-10-CM

## 2014-04-04 MED ORDER — DABIGATRAN ETEXILATE MESYLATE 150 MG PO CAPS: 150.0000 mg | ORAL_CAPSULE | Freq: Two times a day (BID) | ORAL | Status: DC

## 2014-04-04 MED ORDER — DIGOXIN 250 MCG PO TABS: 0.2500 mg | ORAL_TABLET | Freq: Every day | ORAL | Status: DC

## 2014-04-04 MED ORDER — NITROGLYCERIN 0.4 MG SL SUBL: 0.4000 mg | SUBLINGUAL_TABLET | SUBLINGUAL | Status: DC | PRN

## 2014-04-04 NOTE — Assessment & Plan Note (Signed)
His blood pressure is elevated but he states that it has been good at home. He will continue his atenolol. He is encouraged to maintain a low sodium diet.

## 2014-04-04 NOTE — Assessment & Plan Note (Signed)
He appears to be maintaining NSR. I have asked him to stop digoxin but if he has more palpitations and atrial fib, he is instructed to start it back. Hopefully he will not need additional digoxin. He will continue his pradaxa.

## 2014-04-04 NOTE — Progress Notes (Deleted)
Joshua [name] and Joshua [name]. The half life of Pradaxa is 12 hours. The peak action of onset is 2-4 hours. I would recommend holding the drug for 2 days prior to colonoscopy. I would suggest your GI MD determine the appropriate time for which it would be appropriate to hold and restart Pradaxa after colonoscopy depending on what they do to the patient at the time of the procedure. Cristopher Peru

## 2014-04-04 NOTE — Progress Notes (Signed)
HPI Joshua [name] returns today for followup. He is a very pleasant 63 year old man with a history of paroxysmal atrial fibrillation, years after successful ablation of SVT. In the interim, he has been stable. He has palpitations which occur once a month, and last 2-3 minutes at a time. It is thought that these symptoms represent atrial fibrillation. He denies chest pain, shortness of breath, or peripheral edema. He denies syncope. Overall he feels well.  Allergies  Allergen Reactions  . Niacin      Current Outpatient Prescriptions  Medication Sig Dispense Refill  . aspirin 81 MG tablet Take 81 mg by mouth daily.        Marland Kitchen atenolol (TENORMIN) 50 MG tablet Take 0.5 tablets (25 mg total) by mouth 2 (two) times daily.  60 tablet  12  . atorvastatin (LIPITOR) 40 MG tablet TAKE 1 TABLET BY MOUTH EVERY DAY  30 tablet  6  . fenofibrate micronized (LOFIBRA) 200 MG capsule Take 200 mg by mouth daily before breakfast.        . nitroGLYCERIN (NITROSTAT) 0.4 MG SL tablet Place 0.4 mg under the tongue every 5 (five) minutes as needed.        . pioglitazone (ACTOS) 45 MG tablet Take 45 mg by mouth. Wednesday      . PRADAXA 150 MG CAPS capsule TAKE ONE CAPSULE BY MOUTH EVERY 12 HOURS  60 capsule  0  . quinapril (ACCUPRIL) 40 MG tablet Take 40 mg by mouth at bedtime.         No current facility-administered medications for this visit.     Past Medical History  Diagnosis Date  . CAD (coronary artery disease)   . Hyperlipidemia   . HTN (hypertension)   . DM (diabetes mellitus)   . Renal insufficiency   . Gout   . Back pain   . Arthritis   . Atrial fibrillation   . Hearing problem   . Elevated PSA   . Prostatitis     ROS:   All systems reviewed and negative except as noted in the HPI.   Past Surgical History  Procedure Laterality Date  . Cardiac catheterization  11/07/07    with 2 stents  . Electrophysiologic study and rf catheter ablation   of av node reentrant tachycardia.    . Vasectomy     . External ear surgery Right     cartilage     Family History  Problem Relation Age of Onset  . Hypertension Mother   . Gout Mother   . Alzheimer's disease Mother   . Heart failure Father   . Heart disease Brother   . Cancer Brother     type unknown     History   Social History  . Marital Status: Married    Spouse Name: N/A    Number of Children: 2  . Years of Education: N/A   Occupational History  . SERV TECH    Social History Main Topics  . Smoking status: Never Smoker   . Smokeless tobacco: Never Used  . Alcohol Use: No  . Drug Use: No  . Sexual Activity: Not on file   Other Topics Concern  . Not on file   Social History Narrative  . No narrative on file     BP 150/75  Pulse 51  Ht 5\' 8"  (1.727 m)  Wt 246 lb (111.585 kg)  BMI 37.41 kg/m2  Physical Exam:  Well appearing 63 year old man, NAD HEENT: Unremarkable Neck:  7  cm JVD, no thyromegally Lungs:  Clear with no wheezes, rales, or rhonchi. HEART:  Regular rate rhythm, no murmurs, no rubs, no clicks Abd:  soft, positive bowel sounds, no organomegally, no rebound, no guarding Ext:  2 plus pulses, no edema, no cyanosis, no clubbing Skin:  No rashes no nodules Neuro:  CN II through XII intact, motor grossly intact  EKG - sinus bradycardia with left ventricular hypertrophy   Assess/Plan:

## 2014-04-04 NOTE — Patient Instructions (Addendum)
Your physician has recommended you make the following change in your medication:  1) Hold Digoxin  Your physician wants you to follow-up in: 1 year with Dr. Lovena Le.  You will receive a reminder letter in the mail two months in advance. If you don't receive a letter, please call our office to schedule the follow-up appointment.

## 2014-08-02 ENCOUNTER — Other Ambulatory Visit: Payer: Self-pay | Admitting: Internal Medicine

## 2014-08-02 ENCOUNTER — Other Ambulatory Visit: Payer: Self-pay | Admitting: Cardiology

## 2014-08-02 MED ORDER — ATORVASTATIN CALCIUM 40 MG PO TABS: ORAL_TABLET | ORAL | Status: DC

## 2014-09-07 ENCOUNTER — Other Ambulatory Visit: Payer: Self-pay

## 2014-09-07 MED ORDER — ATENOLOL 50 MG PO TABS: 25.0000 mg | ORAL_TABLET | Freq: Two times a day (BID) | ORAL | Status: DC

## 2014-09-17 ENCOUNTER — Ambulatory Visit (INDEPENDENT_AMBULATORY_CARE_PROVIDER_SITE_OTHER): Payer: Medicare HMO | Admitting: Nurse Practitioner

## 2014-09-17 ENCOUNTER — Telehealth: Payer: Self-pay | Admitting: Internal Medicine

## 2014-09-17 ENCOUNTER — Encounter: Payer: Self-pay | Admitting: Nurse Practitioner

## 2014-09-17 VITALS — BP 148/86 | HR 111 | Ht 68.0 in | Wt 247.6 lb

## 2014-09-17 DIAGNOSIS — R079 Chest pain, unspecified: Secondary | ICD-10-CM

## 2014-09-17 DIAGNOSIS — I480 Paroxysmal atrial fibrillation: Secondary | ICD-10-CM

## 2014-09-17 DIAGNOSIS — I483 Typical atrial flutter: Secondary | ICD-10-CM

## 2014-09-17 LAB — TROPONIN I: Troponin I: 0.01 ng/mL (ref <0.06)

## 2014-09-17 MED ORDER — ATENOLOL 50 MG PO TABS: 50.0000 mg | ORAL_TABLET | Freq: Two times a day (BID) | ORAL | Status: DC

## 2014-09-17 NOTE — Telephone Encounter (Signed)
Pt c/o of Chest Pain: 1. Are you having CP right now? Goes and comes 2. Are you experiencing any other symptoms (ex. SOB, nausea, vomiting, sweating)? BP is up (134/90), Blood sugar up around 200-217,  3. How long have you been experiencing CP? Yesterday 12/20; soreness before (pulled muscle maybe)- pain shoots to the heart once in a while. 4. Is your CP continuous or coming and going? Going and coming 5. Have you taken Nitroglycerin? No (has it but not taken it)

## 2014-09-17 NOTE — Addendum Note (Signed)
Addended by: Burtis Junes on: 09/17/2014 04:20 PM   Modules accepted: Orders

## 2014-09-17 NOTE — Patient Instructions (Addendum)
We will be checking the following labs today BMET, CBC, TSH & Troponin  We will arrange for an echocardiogram  We will arrange for a lexiscan Myoview  See Dr. Lovena Le on Wednesday - 09/26/14 at 1:15pm  Stay on your current medicines but increase the Atenolol to 50 mg twice a day - this has been sent to your drug store  Use your NTG under your tongue for recurrent chest pain. May take one tablet every 5 minutes. If you are still having discomfort after 3 tablets in 15 minutes, call 911.  Call the [place] office at [phone] if you have any questions, problems or concerns.

## 2014-09-17 NOTE — Telephone Encounter (Signed)
I spoke with the pt and he complains of left sided chest discomfort that comes and goes that radiates into his jaw and tingling/numbness in the fingers of left hand with these episodes. The pt also notices a "shooting pain to his heart" when he has palpitations.  The pt denies SOB, N & V and diaphoresis.  The pt states that 2 weeks ago he noticed right sided pain in his chest when he takes a deep breath.  The pain with inspiration has continued and increased in frequency. The pt has had issues with BP and blood sugar.  The pt stopped Actos and I advised him that this would need to be managed by PCP.  The PCP also started an additional BP medication a few months ago but pt is unsure of name. The pt has been taking Digoxin PRN for palpitations.  I will discuss his case with provider in the office. The pt can be reached at [phone] .

## 2014-09-17 NOTE — Telephone Encounter (Signed)
I spoke with the pt's wife and gave her pt appointment for today at 3:30 with [name] NP.  She also said that the new BP med started by PCP was Amlodipine 5mg  daily.

## 2014-09-17 NOTE — [place] Notes (Addendum)
[place] Date of Birth: [birth year] Medical Record #[id number]  History of Present Illness: Mr. Deman is seen back today for a work in visit. Seen for Dr. Lovena Le - formerly saw Dr. Stanford Breed. He has CAD, PAF, prior SVT ablation, HTN, HLD, CKD and palpitations. He is on chronic Pradaxa.   Called here earlier today with - "I spoke with the pt and he complains of left sided chest discomfort that comes and goes that radiates into his jaw and tingling/numbness in the fingers of left hand with these episodes. The pt also notices a "shooting pain to his heart" when he has palpitations. The pt denies SOB, N & V and diaphoresis. The pt states that 2 weeks ago he noticed right sided pain in his chest when he takes a deep breath. The pain with inspiration has continued and increased in frequency. The pt has had issues with BP and blood sugar. The pt stopped Actos and I advised him that this would need to be managed by PCP. The PCP also started an additional BP medication a few months ago but pt is unsure of name. The pt has been taking Digoxin PRN for palpitations. I will discuss his case with provider in the office. The pt can be reached at [phone] ." Thus added to my schedule.   Last seen here in July and was felt to be doing well. Digoxin was stopped. BP was noted to be up but had had good control at home.   Comes in today. Here with his wife. He says that since his digoxin was stopped he has had some brief spells of AF - will take an occasional digoxin if his spell lasts more than a few minutes. Has not felt well over the past 10 days or so - did not think he had had AF - but notes his heart rate is up today. He has had chest pain for several weeks - worse with deep breath - not really exertional. Can't say how long it has lasted. Worse last night - may have been worse with lying down. Feels it more on the right side of his chest. Worse with eating today. Tingling in the fingers of his left hand  - no actual pain down his left arm. Today with jaw pain as well. He has been walking for about an hour a day with no real problem. Saw his PCP over a month ago - BP was up and Norvasc was added. He feels dizzy.   Current Outpatient Prescriptions  Medication Sig Dispense Refill  . amLODipine (NORVASC) 5 MG tablet Take 5 mg by mouth daily.    Marland Kitchen aspirin 81 MG tablet Take 81 mg by mouth daily.      Marland Kitchen atenolol (TENORMIN) 50 MG tablet Take 1 tablet (50 mg total) by mouth 2 (two) times daily. 60 tablet 3  . atorvastatin (LIPITOR) 40 MG tablet TAKE 1 TABLET BY MOUTH EVERY DAY 30 tablet 6  . dabigatran (PRADAXA) 150 MG CAPS capsule Take 1 capsule (150 mg total) by mouth 2 (two) times daily. 60 capsule 11  . digoxin (LANOXIN) 0.25 MG tablet Take 1 tablet (0.25 mg total) by mouth daily. (Patient taking differently: Take 0.25 mg by mouth as needed. ) 90 tablet 3  . fenofibrate micronized (LOFIBRA) 200 MG capsule Take 200 mg by mouth daily before breakfast.      . nitroGLYCERIN (NITROSTAT) 0.4 MG SL tablet Place 1 tablet (0.4 mg total) under the tongue every 5 (  five) minutes as needed. 30 tablet 3  . pioglitazone (ACTOS) 45 MG tablet Take 45 mg by mouth. Wednesday    . quinapril (ACCUPRIL) 40 MG tablet Take 40 mg by mouth at bedtime.       No current facility-administered medications for this visit.    Allergies  Allergen Reactions  . Niacin     Past Medical History  Diagnosis Date  . CAD (coronary artery disease)   . Hyperlipidemia   . HTN (hypertension)   . DM (diabetes mellitus)   . Renal insufficiency   . Gout   . Back pain   . Arthritis   . Atrial fibrillation   . Hearing problem   . Elevated PSA   . Prostatitis     Past Surgical History  Procedure Laterality Date  . Cardiac catheterization  11/07/07    with 2 stents  . Electrophysiologic study and rf catheter ablation   of av node reentrant tachycardia.    . Vasectomy    . External ear surgery Right     cartilage    History   Smoking status  . Never Smoker   Smokeless tobacco  . Never Used    History  Alcohol Use No    Family History  Problem Relation Age of Onset  . Hypertension Mother   . Gout Mother   . Alzheimer's disease Mother   . Heart failure Father   . Heart disease Brother   . Cancer Brother     type unknown    Review of Systems: The review of systems is per the HPI.  All other systems were reviewed and are negative.  Physical Exam: BP 148/86 mmHg  Pulse 111  Ht 5\' 8"  (1.727 m)  Wt 247 lb 9.6 oz (112.311 kg)  BMI 37.66 kg/m2 Patient is very pleasant and in no acute distress. He is obese. Skin is warm and dry. Color is normal.  HEENT is unremarkable. Normocephalic/atraumatic. PERRL. Sclera are nonicteric. Neck is supple. No masses. No JVD. Lungs are clear. Cardiac exam shows an irregular rate and rhythm. Rate is fast. Abdomen is soft. Extremities are without edema. Gait and ROM are intact. No gross neurologic deficits noted.  Wt Readings from Last 3 Encounters:  09/17/14 247 lb 9.6 oz (112.311 kg)  04/04/14 246 lb (111.585 kg)  01/30/14 238 lb (107.956 kg)    LABORATORY DATA/PROCEDURES:  EKG today shows atrial flutter RVR.    Lab Results  Component Value Date   WBC 4.4 05/06/2010   HGB 11.7* 05/06/2010   HCT 36.4* 05/06/2010   PLT 155 05/06/2010   GLUCOSE 97 05/02/2010   CHOL 119 04/29/2010   TRIG 97.0 04/29/2010   HDL 24.30* 04/29/2010   LDLCALC 75 04/29/2010   ALT 26 05/02/2010   AST 31 05/02/2010   NA 138 05/02/2010   K 4.1 05/02/2010   CL 103 05/02/2010   CREATININE 1.24 05/02/2010   BUN 13 05/02/2010   CO2 23 05/02/2010   TSH 5.373* 04/28/2010   PSA 37.87 Test Methodology: Hybritech PSA* 05/03/2010   INR 1.09 05/02/2010   MICROALBUR 0.6 09/09/2009    BNP (last 3 results) No results for input(s): PROBNP in the last 8760 hours.  PROCEDURES PERFORMED: 1. Left heart catheterization. 2. Selective coronary angiography. 3. Left  ventriculography.  ACCESS AND EQUIPMENT: The area about the right femoral artery was anesthetized with 1% lidocaine, and a 6-French sheath was placed in the right femoral artery via the modified Seldinger technique. Standard preformed  6-French JL-4 and JR-4 catheters were used in selective coronary geography and an angled pigtail catheter was used for left heart catheterization and left ventriculography. A total of 120 mL of Omnipaque was used. All exchanges were made over wire, and the patient tolerated the procedure well without any immediate complications.  HEMODYNAMICS: Aorta 139/78 mmHg. Left ventricle 137/18 mmHg.  ANGIOGRAPHIC FINDINGS: 1. The left main coronary artery gives rise to the left anterior  descending and circumflex vessels. There is no significant flow-  limiting coronary atherosclerosis noted. 2. The left anterior descending is a medium-caliber vessel with 2  diagonal branches. There is an eccentric 75% to 80% stenosis  within the proximal left anterior descending. There is a first  diagonal with takeoff in this region that appears to be subtotally  occluded with some reconstitution distally in its bifurcation. 3. Circumflex coronary artery is medium in caliber. There is a small  first obtuse marginal followed by a very large second obtuse  marginal and then a third more distal obtuse marginal. Within the  distal portion of the circumflex around the takeoff of the third  obtuse marginal was a 99% stenosis. 4. The right coronary artery is a medium-to-large caliber dominant  vessel with large posterior descending branch. There is ectatic  diffuse disease within the proximal to midportion of the vessel  with stenoses in the 50% range. There is 40% to 50% stenosis in  the mid-to-distal segment. 5. Left ventriculography was performed in the RAO projection,  revealed an ejection  fraction approximately 65% to 70% with no  focal wall motion abnormality.  DIAGNOSES: 1. Essentially 2-vessel obstructive coronary disease with a 75% to 80%  eccentric proximal left anterior descending stenosis, 99% distal  circumflex stenosis, and otherwise moderate disease within the  right coronary artery. There is a first diagonal branch that is  subtotally occluded in the region of the proximal left anterior  descending stenosis. Also noted is some right-to-left collateral  filling of the distal circumflex vessel. 2. Left ventricular ejection fraction approximately 65% to 70% with no  focal wall motion abnormality and a left ventricular end-diastolic  pressure of 18 mmHg.  DISCUSSION: I reviewed the results with the patient and discussed the films with Dr. Burt Knack. At this point would anticipate percutaneous intervention to address the left anterior descending and circumflex vessels with otherwise medical therapy.  Satira Sark, MD Electronically Signed    SGM/MEDQ D: 11/07/2007 T: 11/08/2007 Job: [id number]   CARDIAC CATHETERIZATION PCI  PROCEDURES: 1. Intravascular ultrasound, percutaneous transluminal coronary  angioplasty and stenting of the left circumflex. 2. Intravascular ultrasound, percutaneous transluminal coronary  angioplasty and stenting of the left anterior descending artery. 3. Angio-Seal of the right femoral artery.  INDICATIONS: Mr. Epp is a 63 year old gentleman who presented with a non-ST-elevation MI. He just underwent diagnostic catheterization by Dr. Domenic Polite. He has underlying type 2 diabetes, hypertension and dyslipidemia. His diagnostic catheterization demonstrated critical left circumflex disease with 99% stenosis in the midcircumflex just beyond a large OM branch. He also had severe eccentric proximal LAD disease that angiographically appeared to be  at least 80% stenosed. His ventricular function is preserved. After reviewing with Dr. Domenic Polite, I elected to proceed with two-vessel PCI and planned drug-eluting stents.  A 6-French sheath was in place. The patient was already on Lovenox and Integrilin. His Lovenox had just been given within the last 2-1/2 hours. A factor X level was sent to the lab and came back greater than 1. An 6-French XB-4  guide catheter was inserted. A Cougar guidewire was used to wire the left circumflex lesion. This was done with a moderate amount of difficulty. After the wire was beyond the lesion into the distal branch, I dilated the vessel with a 2.0 x 15-mm Maverick balloon to up to 8 atmospheres. Following predilatation, angiography demonstrated an approximate 2.5-mm vessel. I elected to stent the vessel with a 2.5 x 23-mm Cypher stent, which was deployed at 16 atmospheres. Following stenting there was an excellent angiographic result with a widely patent stent and TIMI-3 flow. At that point I elected to perform IVUS to check for good stent apposition and expansion. This demonstrated good apposition off the proximal and distal edges of the stent. However, there were portions in the middle of the stent that were under-deployed. I put decided to postdilate the entire stented segment with a 2.75 x 20-mm Quantum Maverick balloon, which was taken to 18 atmospheres on two inflations. Following postdilatation, there was an excellent angiographic result with TIMI-3 flow. At that point I elected to move on to the LAD. The same Cougar guidewire was brought back and used to wire the LAD. I decided to start IVUS of the LAD to size the vessel and to find the morphology of the plaque. The IVUS was advanced with a moderate amount of difficulty past the lesion. There was heavy calcification throughout the proximal LAD with severe stenosis demonstrated at the area that  appeared the tightest by angiography. Because of the 360-degree rim of superficial calcium, I elected to predilate the vessel. The same 2.75 x 20-mm Quantum Maverick balloon was used and it was taken to 12 atmospheres. The balloon expanded well. Following balloon dilatation, angiography demonstrated improvement in the appearance of the vessel although there likely was a localized balloon dissection there. I decided to treat the area with a 3.5 x 18-mm Cypher stent, which was carefully positioned just back to the ostium of the LAD and then was deployed at 18 atmospheres. Following stenting there was an excellent angiographic result. Post- stent IVUS demonstrated good stent expansion. The most proximal edge of the stent appeared well-expanded but it did not quite reach the vessel wall, but it was back into the left main stem just approximately 1 mm. I elected to postdilate the stent with a 3.75 x 15-mm Quantum Maverick, which was taken to 18 atmospheres on two inflations. At the completion of the procedure there was an excellent angiographic result with a widely patent stent and TIMI-3 flow in the vessel.  ASSESSMENT: Successful two-vessel percutaneous coronary intervention of the left mid left circumflex and proximal left anterior descending artery using Cypher drug-eluting stents in each vessel.  Recommend a minimum of 12 months of dual antiplatelet therapy with aspirin and Plavix. If Mr. Nudelman is tolerating those medications in combination, I would favor 2 years of therapy.   Juanda Bond. Burt Knack, MD Electronically Signed MDC/MEDQ D: 11/07/2007 T: 11/08/2007 Job: [id number]     Assessment / Plan:  1. Chest pain - remote PCI - no recent cardiac evaluation - has EKG changes in the setting of his atrial flutter. Discussed with Dr. Rayann Heman (DOD) - will arrange for lexiscan Myoview. Check cardiac enzymes - Troponin ordered STAT for [place]. NTG  prn. Will try to manage medically but may need cardiac cath.   2. Atrial flutter - discussed with Dr. Rayann Heman - will try to rate control. Atenolol increased to 50 mg BID. No more digoxin. May need to change Norvasc to Diltiazem. Continue anticoagulation.  He is pretty adamant that he goes in and out of rhythm - so would hold on cardioversion for now but may need to consider as well as possible ablation.   3. HTN - we have room to titrate meds if needed.   4. PAF  5. Past SVT ablation  6. Obesity  7. Chronic anticoagulation with Pradaxa - in the doughnut hole - will try to give samples.   Patient is agreeable to this plan and will call if any problems develop in the interim.   Burtis Junes, RN, [place] [address] [place], [place]  [address] [phone]

## 2014-09-18 ENCOUNTER — Ambulatory Visit (HOSPITAL_COMMUNITY): Payer: Medicare HMO | Attending: Cardiology

## 2014-09-18 DIAGNOSIS — E119 Type 2 diabetes mellitus without complications: Secondary | ICD-10-CM | POA: Insufficient documentation

## 2014-09-18 DIAGNOSIS — I483 Typical atrial flutter: Secondary | ICD-10-CM | POA: Diagnosis not present

## 2014-09-18 DIAGNOSIS — E785 Hyperlipidemia, unspecified: Secondary | ICD-10-CM | POA: Diagnosis not present

## 2014-09-18 DIAGNOSIS — I480 Paroxysmal atrial fibrillation: Secondary | ICD-10-CM

## 2014-09-18 DIAGNOSIS — I10 Essential (primary) hypertension: Secondary | ICD-10-CM | POA: Diagnosis not present

## 2014-09-18 DIAGNOSIS — R079 Chest pain, unspecified: Secondary | ICD-10-CM

## 2014-09-18 LAB — BASIC METABOLIC PANEL
BUN: 30 mg/dL — ABNORMAL HIGH (ref 6–23)
CO2: 24 mEq/L (ref 19–32)
Calcium: 9.2 mg/dL (ref 8.4–10.5)
Chloride: 103 mEq/L (ref 96–112)
Creatinine, Ser: 1.3 mg/dL (ref 0.4–1.5)
GFR: 58.23 mL/min — ABNORMAL LOW (ref >60.00)
Glucose, Bld: 143 mg/dL — ABNORMAL HIGH (ref 70–99)
Potassium: 4.1 mEq/L (ref 3.5–5.1)
Sodium: 137 mEq/L (ref 135–145)

## 2014-09-18 LAB — CBC
HCT: 46.2 % (ref 39.0–52.0)
Hemoglobin: 15.3 g/dL (ref 13.0–17.0)
MCHC: 33.1 g/dL (ref 30.0–36.0)
MCV: 90.2 fl (ref 78.0–100.0)
Platelets: 231 10*3/uL (ref 150.0–400.0)
RBC: 5.12 Mil/uL (ref 4.22–5.81)
RDW: 13.4 % (ref 11.5–15.5)
WBC: 11.7 10*3/uL — ABNORMAL HIGH (ref 4.0–10.5)

## 2014-09-18 LAB — TSH: TSH: 5.01 u[IU]/mL — ABNORMAL HIGH (ref 0.35–4.50)

## 2014-09-18 NOTE — [place] Notes (Signed)
2D Echo completed. 09/18/2014 

## 2014-09-19 ENCOUNTER — Telehealth: Payer: Self-pay | Admitting: Nurse Practitioner

## 2014-09-19 NOTE — Telephone Encounter (Signed)
New Msg     Pt wife calling would like to know when blood work should be completed?   States she was contacted this morning but no date was set to have labs completed.  Please call and advise.

## 2014-09-24 ENCOUNTER — Encounter: Payer: Self-pay | Admitting: Internal Medicine

## 2014-09-25 ENCOUNTER — Ambulatory Visit (HOSPITAL_COMMUNITY): Payer: Medicare HMO | Attending: Cardiovascular Disease | Admitting: Radiology

## 2014-09-25 ENCOUNTER — Other Ambulatory Visit: Payer: Self-pay | Admitting: *Deleted

## 2014-09-25 ENCOUNTER — Other Ambulatory Visit (INDEPENDENT_AMBULATORY_CARE_PROVIDER_SITE_OTHER): Payer: Medicare HMO | Admitting: *Deleted

## 2014-09-25 DIAGNOSIS — R002 Palpitations: Secondary | ICD-10-CM | POA: Insufficient documentation

## 2014-09-25 DIAGNOSIS — I480 Paroxysmal atrial fibrillation: Secondary | ICD-10-CM

## 2014-09-25 DIAGNOSIS — E119 Type 2 diabetes mellitus without complications: Secondary | ICD-10-CM | POA: Insufficient documentation

## 2014-09-25 DIAGNOSIS — I10 Essential (primary) hypertension: Secondary | ICD-10-CM

## 2014-09-25 DIAGNOSIS — I25810 Atherosclerosis of coronary artery bypass graft(s) without angina pectoris: Secondary | ICD-10-CM

## 2014-09-25 DIAGNOSIS — R079 Chest pain, unspecified: Secondary | ICD-10-CM | POA: Insufficient documentation

## 2014-09-25 DIAGNOSIS — I2510 Atherosclerotic heart disease of native coronary artery without angina pectoris: Secondary | ICD-10-CM | POA: Diagnosis not present

## 2014-09-25 DIAGNOSIS — I4891 Unspecified atrial fibrillation: Secondary | ICD-10-CM | POA: Insufficient documentation

## 2014-09-25 DIAGNOSIS — I483 Typical atrial flutter: Secondary | ICD-10-CM

## 2014-09-25 LAB — CBC WITH DIFFERENTIAL/PLATELET
Basophils Absolute: 0 10*3/uL (ref 0.0–0.1)
Basophils Relative: 0.5 % (ref 0.0–3.0)
Eosinophils Absolute: 0.1 10*3/uL (ref 0.0–0.7)
Eosinophils Relative: 0.7 % (ref 0.0–5.0)
HCT: 45.3 % (ref 39.0–52.0)
Hemoglobin: 14.7 g/dL (ref 13.0–17.0)
Lymphocytes Relative: 20.5 % (ref 12.0–46.0)
Lymphs Abs: 1.7 10*3/uL (ref 0.7–4.0)
MCHC: 32.5 g/dL (ref 30.0–36.0)
MCV: 91.6 fl (ref 78.0–100.0)
Monocytes Absolute: 0.6 10*3/uL (ref 0.1–1.0)
Monocytes Relative: 7.4 % (ref 3.0–12.0)
Neutro Abs: 5.8 10*3/uL (ref 1.4–7.7)
Neutrophils Relative %: 70.9 % (ref 43.0–77.0)
Platelets: 206 10*3/uL (ref 150.0–400.0)
RBC: 4.94 Mil/uL (ref 4.22–5.81)
RDW: 14 % (ref 11.5–15.5)
WBC: 8.2 10*3/uL (ref 4.0–10.5)

## 2014-09-25 LAB — BASIC METABOLIC PANEL
BUN: 32 mg/dL — ABNORMAL HIGH (ref 6–23)
CO2: 22 mEq/L (ref 19–32)
Calcium: 8.9 mg/dL (ref 8.4–10.5)
Chloride: 109 mEq/L (ref 96–112)
Creatinine, Ser: 1.2 mg/dL (ref 0.4–1.5)
GFR: 67 mL/min (ref >60.00)
Glucose, Bld: 154 mg/dL — ABNORMAL HIGH (ref 70–99)
Potassium: 4.4 mEq/L (ref 3.5–5.1)
Sodium: 140 mEq/L (ref 135–145)

## 2014-09-25 MED ORDER — TECHNETIUM TC 99M SESTAMIBI GENERIC - CARDIOLITE
30.0000 | Freq: Once | INTRAVENOUS | Status: AC | PRN
  Administered 2014-09-25: 30 via INTRAVENOUS

## 2014-09-25 MED ORDER — REGADENOSON 0.4 MG/5ML IV SOLN
0.4000 mg | Freq: Once | INTRAVENOUS | Status: AC
  Administered 2014-09-25: 0.4 mg via INTRAVENOUS

## 2014-09-25 MED ORDER — TECHNETIUM TC 99M SESTAMIBI GENERIC - CARDIOLITE
10.0000 | Freq: Once | INTRAVENOUS | Status: AC | PRN
  Administered 2014-09-25: 10 via INTRAVENOUS

## 2014-09-25 NOTE — [place] Notes (Signed)
[facility] [address] [place], [place] [address] [phone]    Cardiology Nuclear Med Study  Joshua [name] is a 63 y.o. male     MRN : [id number]     DOB: 1951-01-26  Procedure Date: 09/25/2014  Nuclear Med Background Indication for Stress Test:  Evaluation for Ischemia and Follow up CAD History:  CAD Cardiac Risk Factors: Hypertension, NIDDM and Atrial Fib  Symptoms:  Chest Pain (last date of chest discomfort was last night) and Palpitations   Nuclear Pre-Procedure Caffeine/Decaff Intake:  None NPO After: 7:00pm   Lungs:  clear O2 Sat: 98% on room air. IV 0.9% NS with Angio Cath:  22g  IV Site: R Hand  IV Started by:  Matilde Haymaker, RN  Chest Size (in):  54 Cup Size: n/a  Height: 5\' 8"  (1.727 m)  Weight:  247 lb (112.038 kg)  BMI:  Body mass index is 37.56 kg/(m^2). Tech Comments:  Atenolol taken at 0600 today    Nuclear Med Study 1 or 2 day study: 1 day  Stress Test Type:  Treadmill/Lexiscan  Reading MD: n/a  Order Authorizing Provider:  Salome Spotted and Kathrene Alu  Resting Radionuclide: Technetium 71m Sestamibi  Resting Radionuclide Dose: 11.0 mCi   Stress Radionuclide:  Technetium 44m Sestamibi  Stress Radionuclide Dose: 33.0 mCi           Stress Protocol Rest HR: 61 Stress HR: 103  Rest BP: 147/103 Stress BP: 136/62  Exercise Time (min): n/a METS: n/a   Predicted Max HR: 155 bpm % Max HR: 66.45 bpm Rate Pressure Product: 15347   Dose of Adenosine (mg):  n/a Dose of Lexiscan: 0.4 mg  Dose of Atropine (mg): n/a Dose of Dobutamine: n/a mcg/kg/min (at max HR)  Stress Test Technologist: Glade Lloyd, BS-ES  Nuclear Technologist:  Earl Many, CNMT     Rest Procedure:  Myocardial perfusion imaging was performed at rest 45 minutes following the intravenous administration of Technetium 52m Sestamibi. Rest ECG: Atrial flutter with controlled ventricular response.  Stress Procedure:  The patient received IV  Lexiscan 0.4 mg over 15-seconds with concurrent low level exercise and then Technetium 36m Sestamibi was injected at 30-seconds while the patient continued walking one more minute.  Quantitative spect images were obtained after a 45-minute delay.  During the infusion of Lexiscan the patient complained of SOB that resolved in recovery.  Stress ECG: No significant change from baseline ECG  QPS Raw Data Images:  Normal; no motion artifact; normal heart/lung ratio. Stress Images:  There is a medium size anterior apical and mid anterior defect.  There is a medium size basal inferior defect. Rest Images:  There is normal anterior perfusion at rest. There is partial reversibility of inferior wall Subtraction (SDS):  These findings are consistent with ischemia. Transient Ischemic Dilatation (Normal <1.22):  1.02 Lung/Heart Ratio (Normal <0.45):  0.22  Quantitative Gated Spect Images QGS EDV:  152 ml QGS ESV:  81 ml  Impression Exercise Capacity:  Lexiscan with low level exercise. BP Response:  Normal blood pressure response. Clinical Symptoms:  There is dyspnea. ECG Impression:  No significant ST segment change suggestive of ischemia. Comparison with Prior Nuclear Study: No previous nuclear study performed  Overall Impression:  High risk stress nuclear study.  Study is consistent with multivessel coronary disease with medium size reversible defect of moderate intensity of the midanterior and anteroapical wall, and a medium size partially reversible defect of moderate intensity of the  inferobasal wall.  LV Ejection Fraction: 47%.  LV Wall Motion:  Mildly depressed LV systolic function without segmental wall motion abnormalities.  Darlin Coco MD

## 2014-09-26 ENCOUNTER — Ambulatory Visit (INDEPENDENT_AMBULATORY_CARE_PROVIDER_SITE_OTHER): Payer: Medicare HMO | Admitting: Internal Medicine

## 2014-09-26 ENCOUNTER — Encounter: Payer: Self-pay | Admitting: *Deleted

## 2014-09-26 ENCOUNTER — Encounter: Payer: Self-pay | Admitting: Internal Medicine

## 2014-09-26 VITALS — BP 126/78 | HR 70 | Ht 68.0 in | Wt 249.0 lb

## 2014-09-26 DIAGNOSIS — I270 Primary pulmonary hypertension: Secondary | ICD-10-CM

## 2014-09-26 DIAGNOSIS — I4819 Other persistent atrial fibrillation: Secondary | ICD-10-CM

## 2014-09-26 DIAGNOSIS — I10 Essential (primary) hypertension: Secondary | ICD-10-CM

## 2014-09-26 DIAGNOSIS — I481 Persistent atrial fibrillation: Secondary | ICD-10-CM

## 2014-09-26 DIAGNOSIS — I200 Unstable angina: Secondary | ICD-10-CM

## 2014-09-26 DIAGNOSIS — I272 Pulmonary hypertension, unspecified: Secondary | ICD-10-CM | POA: Insufficient documentation

## 2014-09-26 NOTE — [place] Notes (Signed)
HPI [name] returns today for followup. He is a pleasant 63 yo man with a remote h/o SVT s/p AVNRT ablation, who then developed atrial fibrillation and flutter. He saw [name] in our office and c/o chest pressure and underwent exercise stress testing and was found to have multiple perfusion defects and a high risk scan. He returns today to discuss additional treatment. In addition he has a 2D echo which shows mild MR, and pulmonary HTN with pressures in the 50 mmHg range. The patient has not had chest pain but does have exertional chest pressure. No radiation. He is also sob with exertion. He has minimal palpitations. He has known CAD. Allergies  Allergen Reactions  . Niacin     itching     Current Outpatient Prescriptions  Medication Sig Dispense Refill  . amLODipine (NORVASC) 5 MG tablet Take 5 mg by mouth daily.    Marland Kitchen aspirin 81 MG tablet Take 81 mg by mouth daily.      Marland Kitchen atenolol (TENORMIN) 50 MG tablet Take 1 tablet (50 mg total) by mouth 2 (two) times daily. 60 tablet 3  . atorvastatin (LIPITOR) 40 MG tablet TAKE 1 TABLET BY MOUTH EVERY DAY 30 tablet 6  . dabigatran (PRADAXA) 150 MG CAPS capsule Take 1 capsule (150 mg total) by mouth 2 (two) times daily. 60 capsule 11  . fenofibrate micronized (LOFIBRA) 200 MG capsule Take 200 mg by mouth daily before breakfast.      . nitroGLYCERIN (NITROSTAT) 0.4 MG SL tablet Place 1 tablet (0.4 mg total) under the tongue every 5 (five) minutes as needed. (Patient taking differently: Place 0.4 mg under the tongue every 5 (five) minutes as needed for chest pain (MAX 3 TABLETS). ) 30 tablet 3  . pioglitazone (ACTOS) 45 MG tablet Take 45 mg by mouth daily.     . quinapril (ACCUPRIL) 40 MG tablet Take 40 mg by mouth at bedtime.       No current facility-administered medications for this visit.     Past Medical History  Diagnosis Date  . CAD (coronary artery disease)   . Hyperlipidemia   . HTN (hypertension)   . DM (diabetes  mellitus)   . Renal insufficiency   . Gout   . Back pain   . Arthritis   . Atrial fibrillation   . Hearing problem   . Elevated PSA   . Prostatitis     ROS:   All systems reviewed and negative except as noted in the HPI.   Past Surgical History  Procedure Laterality Date  . Cardiac catheterization  11/07/07    with 2 stents  . Electrophysiologic study and rf catheter ablation   of av node reentrant tachycardia.    . Vasectomy    . External ear surgery Right     cartilage     Family History  Problem Relation Age of Onset  . Hypertension Mother   . Gout Mother   . Alzheimer's disease Mother   . Heart failure Father   . Heart disease Brother   . Cancer Brother     type unknown     History   Social History  . Marital Status: Married    Spouse Name: N/A    Number of Children: 2  . Years of Education: N/A   Occupational History  . SERV TECH    Social History Main Topics  . Smoking status: Never Smoker   . Smokeless tobacco: Never Used  .  Alcohol Use: No  . Drug Use: No  . Sexual Activity: Not on file   Other Topics Concern  . Not on file   Social History Narrative     BP 126/78 mmHg  Pulse 70  Ht 5\' 8"  (1.727 m)  Wt 249 lb (112.946 kg)  BMI 37.87 kg/m2  Physical Exam:  Well appearing 63 yo man, anxious but NAD HEENT: Unremarkable Neck:  No JVD, no thyromegally Lymphatics:  No adenopathy Back:  No CVA tenderness Lungs:  Clear with no wheezes HEART:  IRegular rate rhythm, 1/6 systolic murmurs, no rubs, no clicks Abd:  soft, positive bowel sounds, no organomegally, no rebound, no guarding Ext:  2 plus pulses, no edema, no cyanosis, no clubbing Skin:  No rashes no nodules Neuro:  CN II through XII intact, motor grossly intact  EKG - atrial flutter with a CVR  Assess/Plan:

## 2014-09-26 NOTE — Assessment & Plan Note (Signed)
This is present on echo. The etiology is unclear. He will undergo right heart catheterization at the time of his left heart catheterization.

## 2014-09-26 NOTE — Assessment & Plan Note (Signed)
He is symptomatic and has a high risk stress test. I have discussed the treatment options with the patient and the risk/benefit/goals/expectatons of left heart catheterization and possible PCI have been reivewed and he wishes to proceed.

## 2014-09-26 NOTE — Assessment & Plan Note (Signed)
His ventricular rate is controlled. He will hold his Pradaxa tonight and tomorrow prior to having the catheterization.

## 2014-09-26 NOTE — Assessment & Plan Note (Signed)
His blood pressure is well controlled. No change in meds.  

## 2014-09-26 NOTE — Patient Instructions (Signed)
Your physician has requested that you have a cardiac catheterization. Cardiac catheterization is used to diagnose and/or treat various heart conditions. Doctors may recommend this procedure for a number of different reasons. The most common reason is to evaluate chest pain. Chest pain can be a symptom of coronary artery disease (CAD), and cardiac catheterization can show whether plaque is narrowing or blocking your heart's arteries. This procedure is also used to evaluate the valves, as well as measure the blood flow and oxygen levels in different parts of your heart. For further information please visit [email or web address]. Please follow instruction sheet, as given.   See instruction sheet for procedure

## 2014-09-27 ENCOUNTER — Ambulatory Visit (HOSPITAL_COMMUNITY)
Admission: RE | Admit: 2014-09-27 | Discharge: 2014-09-27 | Disposition: A | Discharge status: Home / Self Care | Payer: Medicare HMO | Source: Ambulatory Visit | Attending: Interventional Cardiology | Admitting: Interventional Cardiology

## 2014-09-27 ENCOUNTER — Encounter (HOSPITAL_COMMUNITY)
Admission: RE | Disposition: A | Discharge status: Home / Self Care | Payer: Self-pay | Source: Ambulatory Visit | Attending: Interventional Cardiology

## 2014-09-27 ENCOUNTER — Encounter (HOSPITAL_COMMUNITY): Payer: Self-pay | Admitting: *Deleted

## 2014-09-27 DIAGNOSIS — I340 Nonrheumatic mitral (valve) insufficiency: Secondary | ICD-10-CM | POA: Diagnosis not present

## 2014-09-27 DIAGNOSIS — I2583 Coronary atherosclerosis due to lipid rich plaque: Secondary | ICD-10-CM

## 2014-09-27 DIAGNOSIS — E119 Type 2 diabetes mellitus without complications: Secondary | ICD-10-CM | POA: Insufficient documentation

## 2014-09-27 DIAGNOSIS — I4891 Unspecified atrial fibrillation: Secondary | ICD-10-CM | POA: Insufficient documentation

## 2014-09-27 DIAGNOSIS — I2510 Atherosclerotic heart disease of native coronary artery without angina pectoris: Secondary | ICD-10-CM | POA: Diagnosis present

## 2014-09-27 DIAGNOSIS — Z7982 Long term (current) use of aspirin: Secondary | ICD-10-CM | POA: Insufficient documentation

## 2014-09-27 DIAGNOSIS — I480 Paroxysmal atrial fibrillation: Secondary | ICD-10-CM | POA: Diagnosis present

## 2014-09-27 DIAGNOSIS — R9439 Abnormal result of other cardiovascular function study: Secondary | ICD-10-CM | POA: Diagnosis present

## 2014-09-27 DIAGNOSIS — I10 Essential (primary) hypertension: Secondary | ICD-10-CM | POA: Diagnosis present

## 2014-09-27 DIAGNOSIS — I2582 Chronic total occlusion of coronary artery: Secondary | ICD-10-CM | POA: Diagnosis not present

## 2014-09-27 DIAGNOSIS — I4892 Unspecified atrial flutter: Secondary | ICD-10-CM | POA: Insufficient documentation

## 2014-09-27 DIAGNOSIS — I25110 Atherosclerotic heart disease of native coronary artery with unstable angina pectoris: Secondary | ICD-10-CM | POA: Diagnosis present

## 2014-09-27 DIAGNOSIS — I272 Other secondary pulmonary hypertension: Secondary | ICD-10-CM | POA: Diagnosis not present

## 2014-09-27 DIAGNOSIS — E785 Hyperlipidemia, unspecified: Secondary | ICD-10-CM | POA: Diagnosis not present

## 2014-09-27 DIAGNOSIS — I200 Unstable angina: Secondary | ICD-10-CM | POA: Diagnosis present

## 2014-09-27 HISTORY — PX: LEFT AND RIGHT HEART CATHETERIZATION WITH CORONARY/GRAFT ANGIOGRAM: SHX5448

## 2014-09-27 LAB — GLUCOSE, CAPILLARY
GLUCOSE-CAPILLARY: 112 mg/dL — AB (ref 70–99)
GLUCOSE-CAPILLARY: 104 mg/dL — AB (ref 70–99)

## 2014-09-27 SURGERY — LEFT AND RIGHT HEART CATHETERIZATION WITH CORONARY/GRAFT ANGIOGRAM
Anesthesia: LOCAL

## 2014-09-27 MED ORDER — VERAPAMIL HCL 2.5 MG/ML IV SOLN
INTRAVENOUS | Status: AC
  Filled 2014-09-27: qty 2

## 2014-09-27 MED ORDER — SODIUM CHLORIDE 0.9 % IV SOLN
INTRAVENOUS | Status: DC
  Administered 2014-09-27: 12:00:00 via INTRAVENOUS

## 2014-09-27 MED ORDER — OXYCODONE-ACETAMINOPHEN 5-325 MG PO TABS: 1.0000 | ORAL_TABLET | ORAL | Status: DC | PRN

## 2014-09-27 MED ORDER — HEPARIN SODIUM (PORCINE) 1000 UNIT/ML IJ SOLN
INTRAMUSCULAR | Status: AC
  Filled 2014-09-27: qty 1

## 2014-09-27 MED ORDER — SODIUM CHLORIDE 0.9 % IJ SOLN: 3.0000 mL | Freq: Two times a day (BID) | INTRAMUSCULAR | Status: DC

## 2014-09-27 MED ORDER — SODIUM CHLORIDE 0.9 % IV SOLN: INTRAVENOUS | Status: DC

## 2014-09-27 MED ORDER — SODIUM CHLORIDE 0.9 % IV SOLN: 250.0000 mL | INTRAVENOUS | Status: DC | PRN

## 2014-09-27 MED ORDER — ACETAMINOPHEN 325 MG PO TABS: 650.0000 mg | ORAL_TABLET | ORAL | Status: DC | PRN

## 2014-09-27 MED ORDER — SODIUM CHLORIDE 0.9 % IJ SOLN: 3.0000 mL | INTRAMUSCULAR | Status: DC | PRN

## 2014-09-27 MED ORDER — ASPIRIN 81 MG PO CHEW: 81.0000 mg | CHEWABLE_TABLET | ORAL | Status: DC

## 2014-09-27 MED ORDER — FENTANYL CITRATE 0.05 MG/ML IJ SOLN
INTRAMUSCULAR | Status: AC
  Filled 2014-09-27: qty 2

## 2014-09-27 MED ORDER — NITROGLYCERIN 1 MG/10 ML FOR IR/CATH LAB
INTRA_ARTERIAL | Status: AC
  Filled 2014-09-27: qty 10

## 2014-09-27 MED ORDER — HEPARIN (PORCINE) IN NACL 2-0.9 UNIT/ML-% IJ SOLN
INTRAMUSCULAR | Status: AC
  Filled 2014-09-27: qty 1000

## 2014-09-27 MED ORDER — MIDAZOLAM HCL 2 MG/2ML IJ SOLN
INTRAMUSCULAR | Status: AC
  Filled 2014-09-27: qty 2

## 2014-09-27 MED ORDER — LIDOCAINE HCL (PF) 1 % IJ SOLN
INTRAMUSCULAR | Status: AC
  Filled 2014-09-27: qty 30

## 2014-09-27 MED ORDER — ONDANSETRON HCL 4 MG/2ML IJ SOLN: 4.0000 mg | Freq: Four times a day (QID) | INTRAMUSCULAR | Status: DC | PRN

## 2014-09-27 NOTE — [place] Notes (Signed)
PT stated that he was having a little chest pressure and pain today and last night.  I notified Barbaraann Rondo in the cath lab when I called about the IV problem.  Will continue to monitor closely

## 2014-09-27 NOTE — CV Procedure (Signed)
Left Heart Catheterization with Coronary Angiography  Report  Joshua [name]  63 y.o.  male 07/31/1951  Procedure Date: 09/27/2014 Referring Physician: Crissie Sickles, M.D. Primary Cardiologist: Crissie Sickles, M.D.  INDICATIONS: Unstable angina, recent recurrent atrial flutter, and high risk myocardial perfusion study.  PROCEDURE: 1. Left heart catheterization; 2. Right heart catheterization; 3. Left ventriculography; 4. Coronary angiography  CONSENT:  The risks, benefits, and details of the procedure were explained in detail to the patient. Risks including death, stroke, heart attack, kidney injury, allergy, limb ischemia, bleeding and radiation injury were discussed.  The patient verbalized understanding and wanted to proceed.  Informed written consent was obtained.  PROCEDURE TECHNIQUE:  After Xylocaine anesthesia a 5 French Slender sheath was placed in the right radial artery with an angiocath and the modified Seldinger technique. An 18-gauge antecubital vein Angiocath was exchanged for a 5 French brachial sheath using the modified Seldinger technique. Double glove technique was used to minimize the possibility of infection. White heart cath was performed with a 5 French balloon tip catheter. Coronary angiography was done using a 5 F JR4 and JL 3.5 cm diagnostic catheters.  Left ventriculography was done using the JR 4 catheter and hand injection.   After reviewing the digital images, the case was terminated. The circumflex and LAD stents were widely patent. The first diagonal is totally occluded and collateralized. Hemostasis was achieved in the right radial arteriotomy site with a wrist band.   CONTRAST:  Total of 95 cc.  COMPLICATIONS:  None   HEMODYNAMICS:  Aortic pressure 135/80 mmHg; LV pressure 137 over 8 mmHg; LVEDP 13 mmHg; RA 5 mmHg; RV 40/0 mmHg; PA 39/18 mmHg; PCWP(mean) 9 mmHg; Cardiac Output 5.9 L/m; AV gradient absent  ANGIOGRAPHIC DATA:   The left main coronary  artery is normal.  The left anterior descending artery is widely patent. The proximal LAD stent is widely patent. The LAD reaches the left ventricular apex. Some diffuse disease is noted in the apical LAD. The first agonal is totally occluded and fills by left to left collaterals. The LAD beyond the proximal stent contains 30-40% narrowing..  The left circumflex artery is is a moderate size vessel giving origin to 3 obtuse marginal branches. The first obtuse marginal is large and contains 50% proximal/ostial narrowing. The circumflex beyond the first obtuse marginal contains 40% narrowing immediately proximal to the mid circumflex stent which is widely patent. The third obtuse marginal is small and free of any significant obstruction.  The right coronary artery is dominant and contains moderate plaque and ectasia throughout the proximal to mid and distal segment. No obstructive lesions are noted. A large PDA and 3 left ventricular branches are noted.Marland Kitchen   LEFT VENTRICULOGRAM:  Left ventricular angiogram was done in the 30 RAO projection and revealed normal left ventricular cavity size and contractility with ejection fraction of 60%.   IMPRESSIONS:  1. Widely patent proximal LAD stent with luminal irregularities up to 30-40% in the proximal to mid LAD. The proximal LAD stent is widely patent. A moderate size first diagonal is totally occluded and filled by left to left collaterals. 2. Widely patent circumflex with no evidence of in-stent restenosis in the mid vessel. The first obtuse marginal contains 50% narrowing. The mid circumflex contains 40% narrowing. 3. Widely patent dominant right coronary with marked ectasia/irregularity throughout the proximal mid and distal vessel but with no evidence of obstructive disease. 4. Normal left ventricular function with normal end-diastolic pressure   RECOMMENDATION:  Medical therapy which should include rhythm control. It is likely that poorly controlled  ventricular rates with physical activity or contributing to angina. No explanation for the inferobasal perfusion abnormality can be found. The reversible mid anterior abnormality is due to occlusion of the diagonal branch.  Will hold Pradaxa in the a.m but resume it with p.m. dose tomorrow evening (09/28/2014)  Other medications will not be changed.  Eligible for discharge in 2-3 hours this evening if no difficulty with hemostasis.Marland Kitchen

## 2014-09-27 NOTE — Progress Notes (Signed)
IV team started a 20 gauge in the right AC.  IT is very positional and catheter is not al the way in with the hub to the skin.  I called Barbaraann Rondo in the cath lab and notified him.  He said that the Dr would assess if he could use the site if not they would use the groin, and they were going to move him to the last patient of the day.  Will continue to monitor closely

## 2014-09-27 NOTE — Progress Notes (Signed)
Pt discharged to home.  Pt left floor in wheelchair escorted by wife and Therapist, occupational).  Pt in no distress, vital signs stable as charted.  Right hand IV and telemetry removed.  Discharge instructions given to patient, and pt was able to repeat instructions back to nurse.

## 2014-09-27 NOTE — Interval H&P Note (Signed)
Cath Lab Visit (complete for each Cath Lab visit)  Clinical Evaluation Leading to the Procedure:   ACS: Yes.    Non-ACS:    Anginal Classification: CCS III  Anti-ischemic medical therapy: Maximal Therapy (2 or more classes of medications)  Non-Invasive Test Results: High-risk stress test findings: cardiac mortality >3%/year  Prior CABG: No previous CABG      History and Physical Interval Note:  09/27/2014 6:18 PM  Joshua [name]  has presented today for surgery, with the diagnosis of abnormal nuc  The various methods of treatment have been discussed with the patient and family. After consideration of risks, benefits and other options for treatment, the patient has consented to  Procedure(s): LEFT AND RIGHT HEART CATHETERIZATION WITH CORONARY/GRAFT ANGIOGRAM (N/A) as a surgical intervention .  The patient's history has been reviewed, patient examined, no change in status, stable for surgery.  I have reviewed the patient's chart and labs.  Questions were answered to the patient's satisfaction.     Sinclair Grooms

## 2014-09-27 NOTE — Progress Notes (Signed)
TR BAND REMOVAL  LOCATION:    right radial  DEFLATED PER PROTOCOL:    Yes.    TIME BAND OFF / DRESSING APPLIED:    21:20   SITE UPON ARRIVAL:    Level 0  SITE AFTER BAND REMOVAL:    Level 0  REVERSE ALLEN'S TEST:     positive  CIRCULATION SENSATION AND MOVEMENT:    Within Normal Limits   Yes.    COMMENTS:   Pt tolerated removal of TR band without complications.  Will continue to monitor patient.

## 2014-09-27 NOTE — H&P (View-Only) (Signed)
HPI [name] returns today for followup. He is a pleasant 63 yo man with a remote h/o SVT s/p AVNRT ablation, who then developed atrial fibrillation and flutter. He saw [name] in our office and c/o chest pressure and underwent exercise stress testing and was found to have multiple perfusion defects and a high risk scan. He returns today to discuss additional treatment. In addition he has a 2D echo which shows mild MR, and pulmonary HTN with pressures in the 50 mmHg range. The patient has not had chest pain but does have exertional chest pressure. No radiation. He is also sob with exertion. He has minimal palpitations. He has known CAD. Allergies  Allergen Reactions  . Niacin     itching     Current Outpatient Prescriptions  Medication Sig Dispense Refill  . amLODipine (NORVASC) 5 MG tablet Take 5 mg by mouth daily.    Marland Kitchen aspirin 81 MG tablet Take 81 mg by mouth daily.      Marland Kitchen atenolol (TENORMIN) 50 MG tablet Take 1 tablet (50 mg total) by mouth 2 (two) times daily. 60 tablet 3  . atorvastatin (LIPITOR) 40 MG tablet TAKE 1 TABLET BY MOUTH EVERY DAY 30 tablet 6  . dabigatran (PRADAXA) 150 MG CAPS capsule Take 1 capsule (150 mg total) by mouth 2 (two) times daily. 60 capsule 11  . fenofibrate micronized (LOFIBRA) 200 MG capsule Take 200 mg by mouth daily before breakfast.      . nitroGLYCERIN (NITROSTAT) 0.4 MG SL tablet Place 1 tablet (0.4 mg total) under the tongue every 5 (five) minutes as needed. (Patient taking differently: Place 0.4 mg under the tongue every 5 (five) minutes as needed for chest pain (MAX 3 TABLETS). ) 30 tablet 3  . pioglitazone (ACTOS) 45 MG tablet Take 45 mg by mouth daily.     . quinapril (ACCUPRIL) 40 MG tablet Take 40 mg by mouth at bedtime.       No current facility-administered medications for this visit.     Past Medical History  Diagnosis Date  . CAD (coronary artery disease)   . Hyperlipidemia   . HTN (hypertension)   . DM (diabetes  mellitus)   . Renal insufficiency   . Gout   . Back pain   . Arthritis   . Atrial fibrillation   . Hearing problem   . Elevated PSA   . Prostatitis     ROS:   All systems reviewed and negative except as noted in the HPI.   Past Surgical History  Procedure Laterality Date  . Cardiac catheterization  11/07/07    with 2 stents  . Electrophysiologic study and rf catheter ablation   of av node reentrant tachycardia.    . Vasectomy    . External ear surgery Right     cartilage     Family History  Problem Relation Age of Onset  . Hypertension Mother   . Gout Mother   . Alzheimer's disease Mother   . Heart failure Father   . Heart disease Brother   . Cancer Brother     type unknown     History   Social History  . Marital Status: Married    Spouse Name: N/A    Number of Children: 2  . Years of Education: N/A   Occupational History  . SERV TECH    Social History Main Topics  . Smoking status: Never Smoker   . Smokeless tobacco: Never Used  .  Alcohol Use: No  . Drug Use: No  . Sexual Activity: Not on file   Other Topics Concern  . Not on file   Social History Narrative     BP 126/78 mmHg  Pulse 70  Ht 5\' 8"  (1.727 m)  Wt 249 lb (112.946 kg)  BMI 37.87 kg/m2  Physical Exam:  Well appearing 63 yo man, anxious but NAD HEENT: Unremarkable Neck:  No JVD, no thyromegally Lymphatics:  No adenopathy Back:  No CVA tenderness Lungs:  Clear with no wheezes HEART:  IRegular rate rhythm, 1/6 systolic murmurs, no rubs, no clicks Abd:  soft, positive bowel sounds, no organomegally, no rebound, no guarding Ext:  2 plus pulses, no edema, no cyanosis, no clubbing Skin:  No rashes no nodules Neuro:  CN II through XII intact, motor grossly intact  EKG - atrial flutter with a CVR  Assess/Plan:

## 2014-09-28 LAB — POCT I-STAT 3, VENOUS BLOOD GAS (G3P V)
ACID-BASE DEFICIT: 5 mmol/L — AB (ref 0.0–2.0)
Bicarbonate: 19.4 mEq/L — ABNORMAL LOW (ref 20.0–24.0)
O2 Saturation: 97 %
TCO2: 20 mmol/L (ref 0–100)
pCO2, Ven: 33.6 mmHg — ABNORMAL LOW (ref 45.0–50.0)
pH, Ven: 7.368 — ABNORMAL HIGH (ref 7.250–7.300)
pO2, Ven: 97 mmHg — ABNORMAL HIGH (ref 30.0–45.0)

## 2014-10-01 LAB — POCT I-STAT 3, VENOUS BLOOD GAS (G3P V)
Acid-base deficit: 4 mmol/L — ABNORMAL HIGH (ref 0.0–2.0)
BICARBONATE: 20.8 meq/L (ref 20.0–24.0)
O2 SAT: 72 %
PCO2 VEN: 37.4 mmHg — AB (ref 45.0–50.0)
PO2 VEN: 40 mmHg (ref 30.0–45.0)
TCO2: 22 mmol/L (ref 0–100)
pH, Ven: 7.353 — ABNORMAL HIGH (ref 7.250–7.300)

## 2014-10-01 NOTE — Telephone Encounter (Signed)
Spoke with pts wife wants to know next steps after cath completed last week, 12/31.   According to wife,as pt is at work, pt has not complained of bruising, pain, swelling, or discomfort at cath site. Though still having chest discomfort like before cath but not as bad.   Staff message sent to Lorenda Hatchet to schedule follow up appointment and shared office number with wife to call if has needs in the meantime.

## 2014-10-01 NOTE — Telephone Encounter (Signed)
Follow up    Wife calling   Cath done on last Thursday.    Was told to follow up on next procedure to be done.

## 2014-10-09 ENCOUNTER — Encounter: Payer: Self-pay | Admitting: Internal Medicine

## 2014-10-09 ENCOUNTER — Ambulatory Visit (INDEPENDENT_AMBULATORY_CARE_PROVIDER_SITE_OTHER): Payer: Medicare HMO | Admitting: Internal Medicine

## 2014-10-09 VITALS — BP 130/78 | HR 70 | Ht 68.0 in | Wt 246.4 lb

## 2014-10-09 DIAGNOSIS — I2510 Atherosclerotic heart disease of native coronary artery without angina pectoris: Secondary | ICD-10-CM

## 2014-10-09 DIAGNOSIS — I10 Essential (primary) hypertension: Secondary | ICD-10-CM

## 2014-10-09 DIAGNOSIS — I200 Unstable angina: Secondary | ICD-10-CM

## 2014-10-09 DIAGNOSIS — I480 Paroxysmal atrial fibrillation: Secondary | ICD-10-CM

## 2014-10-09 NOTE — Progress Notes (Signed)
HPI Joshua [name] returns today for followup. He is a pleasant 64 yo man with CAD, s/p PCI stent, who also has a h/o SVT and underwent ablation several years ago. He developed atrial fib and has also had atrial flutter which has been controlled with medical therapy. He had his dose of atenolol increased and feels well with no symptomatic atrial fib or flutter.  The patient underwent catheterization after a high risk nuclear study and this showed only diagonal system disease. No interventions were carried out. He denies palpitations. Allergies  Allergen Reactions  . Niacin Itching     Current Outpatient Prescriptions  Medication Sig Dispense Refill  . amLODipine (NORVASC) 5 MG tablet Take 5 mg by mouth daily.    Marland Kitchen aspirin 81 MG tablet Take 81 mg by mouth daily.      Marland Kitchen atenolol (TENORMIN) 50 MG tablet Take 1 tablet (50 mg total) by mouth 2 (two) times daily. 60 tablet 3  . atorvastatin (LIPITOR) 40 MG tablet TAKE 1 TABLET BY MOUTH EVERY DAY 30 tablet 6  . dabigatran (PRADAXA) 150 MG CAPS capsule Take 1 capsule (150 mg total) by mouth 2 (two) times daily. 60 capsule 11  . fenofibrate micronized (LOFIBRA) 200 MG capsule Take 200 mg by mouth daily before breakfast.      . nitroGLYCERIN (NITROSTAT) 0.4 MG SL tablet Place 1 tablet (0.4 mg total) under the tongue every 5 (five) minutes as needed. (Patient taking differently: Place 0.4 mg under the tongue every 5 (five) minutes as needed for chest pain (MAX 3 TABLETS). ) 30 tablet 3  . pioglitazone (ACTOS) 45 MG tablet Take 45 mg by mouth daily.     . quinapril (ACCUPRIL) 40 MG tablet Take 40 mg by mouth at bedtime.       No current facility-administered medications for this visit.     Past Medical History  Diagnosis Date  . CAD (coronary artery disease)   . Hyperlipidemia   . HTN (hypertension)   . DM (diabetes mellitus)   . Renal insufficiency   . Gout   . Back pain   . Arthritis   . Atrial fibrillation   . Hearing problem   .  Elevated PSA   . Prostatitis     ROS:   All systems reviewed and negative except as noted in the HPI.   Past Surgical History  Procedure Laterality Date  . Cardiac catheterization  11/07/07    with 2 stents  . Electrophysiologic study and rf catheter ablation   of av node reentrant tachycardia.    . Vasectomy    . External ear surgery Right     cartilage  . Left and right heart catheterization with coronary/graft angiogram N/A 09/27/2014    Procedure: LEFT AND RIGHT HEART CATHETERIZATION WITH Beatrix Fetters;  Surgeon: Joshua Grooms, MD;  Location: [facility] CATH LAB;  Service: Cardiovascular;  Laterality: N/A;     Family History  Problem Relation Age of Onset  . Hypertension Mother   . Gout Mother   . Alzheimer's disease Mother   . Heart failure Father   . Heart disease Brother   . Cancer Brother     type unknown     History   Social History  . Marital Status: Married    Spouse Name: N/A    Number of Children: 2  . Years of Education: N/A   Occupational History  . SERV TECH    Social History Main Topics  .  Smoking status: Never Smoker   . Smokeless tobacco: Never Used  . Alcohol Use: No  . Drug Use: No  . Sexual Activity: Not on file   Other Topics Concern  . Not on file   Social History Narrative     BP 130/78 mmHg  Pulse 70  Ht 5\' 8"  (1.727 m)  Wt 246 lb 6.4 oz (111.766 kg)  BMI 37.47 kg/m2  Physical Exam:  Well appearing but obese 64 yo man, NAD HEENT: Unremarkable Neck:  No JVD, no thyromegally Back:  No CVA tenderness Lungs:  Clear with no wheezes HEART:  Regular rate rhythm, no murmurs, no rubs, no clicks Abd:  soft, positive bowel sounds, no organomegally, no rebound, no guarding Ext:  2 plus pulses, no edema, no cyanosis, no clubbing Skin:  No rashes no nodules Neuro:  CN II through XII intact, motor grossly intact   DEVICE  Normal device function.  See PaceArt for details.   Assess/Plan:

## 2014-10-09 NOTE — Assessment & Plan Note (Signed)
His chest pressure is much improved. He will continue his current meds.

## 2014-10-09 NOTE — Assessment & Plan Note (Signed)
His blood pressure is well controlled. No change in medical therapy. He is encouraged to lose weight and use less sodium.

## 2014-10-09 NOTE — Patient Instructions (Signed)
Your physician wants you to follow-up in: 6 months with Dr Taylor You will receive a reminder letter in the mail two months in advance. If you don't receive a letter, please call our office to schedule the follow-up appointment.  

## 2014-10-09 NOTE — Assessment & Plan Note (Signed)
His ventricular rate is well controlled. No change in medical therapy. If his symptoms worsen, would consider additional medical therapy.

## 2014-10-10 NOTE — Assessment & Plan Note (Signed)
He has had no additional symptoms. He has no obstructive CAD by cath except for diagonal disease. No change in meds.

## 2015-02-05 ENCOUNTER — Other Ambulatory Visit: Payer: Self-pay | Admitting: Internal Medicine

## 2015-03-08 ENCOUNTER — Other Ambulatory Visit: Payer: Self-pay | Admitting: *Deleted

## 2015-03-08 MED ORDER — ATENOLOL 50 MG PO TABS: 50.0000 mg | ORAL_TABLET | Freq: Two times a day (BID) | ORAL | Status: DC

## 2015-04-03 ENCOUNTER — Other Ambulatory Visit: Payer: Self-pay | Admitting: Internal Medicine

## 2015-04-12 ENCOUNTER — Encounter: Payer: Self-pay | Admitting: Internal Medicine

## 2015-04-12 ENCOUNTER — Ambulatory Visit (INDEPENDENT_AMBULATORY_CARE_PROVIDER_SITE_OTHER): Payer: Medicare HMO | Admitting: Internal Medicine

## 2015-04-12 ENCOUNTER — Other Ambulatory Visit: Payer: Self-pay

## 2015-04-12 VITALS — BP 130/82 | HR 72 | Ht 68.0 in | Wt 258.0 lb

## 2015-04-12 DIAGNOSIS — I480 Paroxysmal atrial fibrillation: Secondary | ICD-10-CM | POA: Diagnosis not present

## 2015-04-12 DIAGNOSIS — I2510 Atherosclerotic heart disease of native coronary artery without angina pectoris: Secondary | ICD-10-CM

## 2015-04-12 DIAGNOSIS — I10 Essential (primary) hypertension: Secondary | ICD-10-CM

## 2015-04-12 LAB — HM DIABETES EYE EXAM

## 2015-04-12 MED ORDER — AMLODIPINE BESYLATE 5 MG PO TABS: 5.0000 mg | ORAL_TABLET | Freq: Every day | ORAL | Status: DC

## 2015-04-12 MED ORDER — QUINAPRIL HCL 40 MG PO TABS: 40.0000 mg | ORAL_TABLET | Freq: Every day | ORAL | Status: DC

## 2015-04-12 MED ORDER — ATENOLOL 50 MG PO TABS: 50.0000 mg | ORAL_TABLET | Freq: Every day | ORAL | Status: DC

## 2015-04-12 MED ORDER — ATORVASTATIN CALCIUM 40 MG PO TABS: 40.0000 mg | ORAL_TABLET | Freq: Every day | ORAL | Status: DC

## 2015-04-12 NOTE — Assessment & Plan Note (Signed)
He has significant CAD with an occluded diagonal stenosis. He will continue his current meds. I have asked the patient to increase his physical activity and lose weight.

## 2015-04-12 NOTE — Progress Notes (Signed)
HPI Joshua [name] returns today for followup. He is a pleasant 64 yo man with CAD, s/p PCI stent, who also has a h/o SVT and underwent ablation several years ago. He developed atrial fib and has also had atrial flutter which has been controlled with medical therapy. He had his dose of atenolol increased and feels well with no symptomatic atrial fib or flutter.  The patient underwent catheterization after a high risk nuclear study and this showed only diagonal system disease. No interventions were carried out. He denies palpitations. He has not had syncope. He feels his palptations minimally. He has class 2A diastolic heart failure. He has struggled to lose weight.  Allergies  Allergen Reactions  . Niacin Itching     Current Outpatient Prescriptions  Medication Sig Dispense Refill  . amLODipine (NORVASC) 5 MG tablet Take 1 tablet (5 mg total) by mouth daily. 90 tablet 3  . aspirin 81 MG tablet Take 81 mg by mouth daily.      Marland Kitchen atenolol (TENORMIN) 50 MG tablet Take 1 tablet (50 mg total) by mouth daily. 90 tablet 3  . atorvastatin (LIPITOR) 40 MG tablet Take 1 tablet (40 mg total) by mouth daily. 90 tablet 3  . dabigatran (PRADAXA) 150 MG CAPS capsule Take 1 capsule (150 mg total) by mouth 2 (two) times daily. 60 capsule 11  . fenofibrate micronized (LOFIBRA) 200 MG capsule Take 200 mg by mouth daily before breakfast.      . nitroGLYCERIN (NITROSTAT) 0.4 MG SL tablet Place 1 tablet (0.4 mg total) under the tongue every 5 (five) minutes as needed. (Patient taking differently: Place 0.4 mg under the tongue every 5 (five) minutes as needed for chest pain (MAX 3 TABLETS). ) 30 tablet 3  . pioglitazone (ACTOS) 45 MG tablet Take 45 mg by mouth once a week.     . quinapril (ACCUPRIL) 40 MG tablet Take 1 tablet (40 mg total) by mouth at bedtime. 90 tablet 3   No current facility-administered medications for this visit.     Past Medical History  Diagnosis Date  . CAD (coronary artery disease)     . Hyperlipidemia   . HTN (hypertension)   . DM (diabetes mellitus)   . Renal insufficiency   . Gout   . Back pain   . Arthritis   . Atrial fibrillation   . Hearing problem   . Elevated PSA   . Prostatitis     ROS:   All systems reviewed and negative except as noted in the HPI.   Past Surgical History  Procedure Laterality Date  . Cardiac catheterization  11/07/07    with 2 stents  . Electrophysiologic study and rf catheter ablation   of av node reentrant tachycardia.    . Vasectomy    . External ear surgery Right     cartilage  . Left and right heart catheterization with coronary/graft angiogram N/A 09/27/2014    Procedure: LEFT AND RIGHT HEART CATHETERIZATION WITH Beatrix Fetters;  Surgeon: Sinclair Grooms, MD;  Location: [facility] CATH LAB;  Service: Cardiovascular;  Laterality: N/A;     Family History  Problem Relation Age of Onset  . Hypertension Mother   . Gout Mother   . Alzheimer's disease Mother   . Heart failure Father   . Heart disease Brother   . Cancer Brother     type unknown     History   Social History  . Marital Status: Married  Spouse Name: N/A  . Number of Children: 2  . Years of Education: N/A   Occupational History  . SERV TECH    Social History Main Topics  . Smoking status: Never Smoker   . Smokeless tobacco: Never Used  . Alcohol Use: No  . Drug Use: No  . Sexual Activity: Not on file   Other Topics Concern  . Not on file   Social History Narrative     BP 130/82 mmHg  Pulse 72  Ht 5\' 8"  (1.727 m)  Wt 258 lb (117.028 kg)  BMI 39.24 kg/m2  Physical Exam:  Well appearing but obese 64 yo man, NAD HEENT: Unremarkable Neck:  No JVD, no thyromegally Back:  No CVA tenderness Lungs:  Clear with no wheezes HEART:  Regular rate rhythm, no murmurs, no rubs, no clicks Abd:  soft, positive bowel sounds, no organomegally, no rebound, no guarding Ext:  2 plus pulses, trace peripheral edema, no cyanosis, no  clubbing Skin:  No rashes no nodules Neuro:  CN II through XII intact, motor grossly intact  ECG - atrial fib with a controlled VR.   Assess/Plan:

## 2015-04-12 NOTE — Patient Instructions (Signed)

## 2015-04-12 NOTE — Assessment & Plan Note (Signed)
His ventricular rate is well controlled. He will continue his current meds. 

## 2015-04-12 NOTE — Assessment & Plan Note (Signed)
His blood pressure is reasonably well controlled. He will continue his current meds.

## 2015-04-26 ENCOUNTER — Encounter: Payer: Self-pay | Admitting: Internal Medicine

## 2015-05-06 ENCOUNTER — Other Ambulatory Visit: Payer: Self-pay | Admitting: Internal Medicine

## 2015-07-19 ENCOUNTER — Other Ambulatory Visit: Payer: Self-pay

## 2015-07-19 DIAGNOSIS — I10 Essential (primary) hypertension: Secondary | ICD-10-CM

## 2015-07-19 MED ORDER — ATENOLOL 50 MG PO TABS: 50.0000 mg | ORAL_TABLET | Freq: Two times a day (BID) | ORAL | Status: DC

## 2015-08-29 DIAGNOSIS — I480 Paroxysmal atrial fibrillation: Secondary | ICD-10-CM | POA: Diagnosis not present

## 2015-08-29 DIAGNOSIS — I10 Essential (primary) hypertension: Secondary | ICD-10-CM | POA: Diagnosis not present

## 2015-08-29 DIAGNOSIS — I2510 Atherosclerotic heart disease of native coronary artery without angina pectoris: Secondary | ICD-10-CM | POA: Diagnosis not present

## 2015-08-29 DIAGNOSIS — Z6841 Body Mass Index (BMI) 40.0 and over, adult: Secondary | ICD-10-CM | POA: Diagnosis not present

## 2015-08-29 DIAGNOSIS — E1129 Type 2 diabetes mellitus with other diabetic kidney complication: Secondary | ICD-10-CM | POA: Diagnosis not present

## 2015-08-29 DIAGNOSIS — R809 Proteinuria, unspecified: Secondary | ICD-10-CM | POA: Diagnosis not present

## 2015-10-01 DIAGNOSIS — N419 Inflammatory disease of prostate, unspecified: Secondary | ICD-10-CM | POA: Diagnosis not present

## 2015-10-01 DIAGNOSIS — R972 Elevated prostate specific antigen [PSA]: Secondary | ICD-10-CM | POA: Diagnosis not present

## 2015-10-01 DIAGNOSIS — R351 Nocturia: Secondary | ICD-10-CM | POA: Diagnosis not present

## 2015-10-01 DIAGNOSIS — R350 Frequency of micturition: Secondary | ICD-10-CM | POA: Diagnosis not present

## 2015-10-08 DIAGNOSIS — R350 Frequency of micturition: Secondary | ICD-10-CM | POA: Diagnosis not present

## 2015-10-08 DIAGNOSIS — R972 Elevated prostate specific antigen [PSA]: Secondary | ICD-10-CM | POA: Diagnosis not present

## 2015-10-08 DIAGNOSIS — Z0000 Encounter for general adult medical examination without abnormal findings: Secondary | ICD-10-CM | POA: Diagnosis not present

## 2015-10-14 ENCOUNTER — Encounter: Payer: Self-pay | Admitting: Internal Medicine

## 2015-11-04 DIAGNOSIS — Z6841 Body Mass Index (BMI) 40.0 and over, adult: Secondary | ICD-10-CM | POA: Diagnosis not present

## 2015-11-04 DIAGNOSIS — J209 Acute bronchitis, unspecified: Secondary | ICD-10-CM | POA: Diagnosis not present

## 2015-11-04 DIAGNOSIS — R05 Cough: Secondary | ICD-10-CM | POA: Diagnosis not present

## 2016-02-13 DIAGNOSIS — Z125 Encounter for screening for malignant neoplasm of prostate: Secondary | ICD-10-CM | POA: Diagnosis not present

## 2016-02-13 DIAGNOSIS — E784 Other hyperlipidemia: Secondary | ICD-10-CM | POA: Diagnosis not present

## 2016-02-13 DIAGNOSIS — M109 Gout, unspecified: Secondary | ICD-10-CM | POA: Diagnosis not present

## 2016-02-13 DIAGNOSIS — E1129 Type 2 diabetes mellitus with other diabetic kidney complication: Secondary | ICD-10-CM | POA: Diagnosis not present

## 2016-02-13 DIAGNOSIS — N183 Chronic kidney disease, stage 3 (moderate): Secondary | ICD-10-CM | POA: Diagnosis not present

## 2016-02-17 DIAGNOSIS — R972 Elevated prostate specific antigen [PSA]: Secondary | ICD-10-CM | POA: Diagnosis not present

## 2016-02-17 DIAGNOSIS — M109 Gout, unspecified: Secondary | ICD-10-CM | POA: Diagnosis not present

## 2016-02-17 DIAGNOSIS — M545 Low back pain: Secondary | ICD-10-CM | POA: Diagnosis not present

## 2016-02-17 DIAGNOSIS — N183 Chronic kidney disease, stage 3 (moderate): Secondary | ICD-10-CM | POA: Diagnosis not present

## 2016-02-17 DIAGNOSIS — Z23 Encounter for immunization: Secondary | ICD-10-CM | POA: Diagnosis not present

## 2016-02-17 DIAGNOSIS — E1129 Type 2 diabetes mellitus with other diabetic kidney complication: Secondary | ICD-10-CM | POA: Diagnosis not present

## 2016-02-17 DIAGNOSIS — E784 Other hyperlipidemia: Secondary | ICD-10-CM | POA: Diagnosis not present

## 2016-02-17 DIAGNOSIS — I2510 Atherosclerotic heart disease of native coronary artery without angina pectoris: Secondary | ICD-10-CM | POA: Diagnosis not present

## 2016-02-17 DIAGNOSIS — I480 Paroxysmal atrial fibrillation: Secondary | ICD-10-CM | POA: Diagnosis not present

## 2016-02-17 DIAGNOSIS — Z0000 Encounter for general adult medical examination without abnormal findings: Secondary | ICD-10-CM | POA: Diagnosis not present

## 2016-04-21 ENCOUNTER — Other Ambulatory Visit: Payer: Self-pay | Admitting: Internal Medicine

## 2016-04-24 ENCOUNTER — Ambulatory Visit (INDEPENDENT_AMBULATORY_CARE_PROVIDER_SITE_OTHER): Payer: Medicare HMO | Admitting: Internal Medicine

## 2016-04-24 ENCOUNTER — Encounter: Payer: Self-pay | Admitting: Internal Medicine

## 2016-04-24 VITALS — BP 136/92 | HR 52 | Ht 68.0 in | Wt 278.8 lb

## 2016-04-24 DIAGNOSIS — I480 Paroxysmal atrial fibrillation: Secondary | ICD-10-CM | POA: Diagnosis not present

## 2016-04-24 DIAGNOSIS — I10 Essential (primary) hypertension: Secondary | ICD-10-CM

## 2016-04-24 DIAGNOSIS — I2510 Atherosclerotic heart disease of native coronary artery without angina pectoris: Secondary | ICD-10-CM

## 2016-04-24 MED ORDER — ATENOLOL 50 MG PO TABS: 50.0000 mg | ORAL_TABLET | Freq: Two times a day (BID) | ORAL | 3 refills | Status: DC

## 2016-04-24 MED ORDER — ATORVASTATIN CALCIUM 40 MG PO TABS
40.0000 mg | ORAL_TABLET | Freq: Every day | ORAL | 3 refills | Status: DC
Start: 2016-04-24 — End: 2017-04-19

## 2016-04-24 MED ORDER — DABIGATRAN ETEXILATE MESYLATE 150 MG PO CAPS: 150.0000 mg | ORAL_CAPSULE | Freq: Two times a day (BID) | ORAL | 11 refills | Status: DC

## 2016-04-24 MED ORDER — QUINAPRIL HCL 40 MG PO TABS: 40.0000 mg | ORAL_TABLET | Freq: Every day | ORAL | 3 refills | Status: DC

## 2016-04-24 MED ORDER — AMLODIPINE BESYLATE 5 MG PO TABS: 5.0000 mg | ORAL_TABLET | Freq: Every day | ORAL | 3 refills | Status: DC

## 2016-04-24 NOTE — Progress Notes (Signed)
HPI [name] returns today for followup. He is a pleasant 65 yo man with CAD, s/p PCI stent, who also has a h/o SVT and underwent ablation several years ago. He developed atrial fib and has also had atrial flutter which has been controlled with medical therapy. He had his dose of atenolol increased and feels well with no symptomatic atrial fib or flutter.  The patient underwent catheterization after a high risk nuclear study and this showed only diagonal system disease. No interventions were carried out. He denies palpitations. He has not had syncope. He feels his palptations minimally. He has class 2A diastolic heart failure. He has struggled to lose weight.  Allergies  Allergen Reactions  . Niacin Itching     Current Outpatient Prescriptions  Medication Sig Dispense Refill  . amLODipine (NORVASC) 5 MG tablet Take 1 tablet (5 mg total) by mouth daily. 90 tablet 3  . aspirin 81 MG tablet Take 81 mg by mouth daily.      Marland Kitchen atenolol (TENORMIN) 50 MG tablet Take 1 tablet (50 mg total) by mouth 2 (two) times daily. 180 tablet 3  . atorvastatin (LIPITOR) 40 MG tablet Take 1 tablet (40 mg total) by mouth daily. 90 tablet 3  . fenofibrate micronized (LOFIBRA) 200 MG capsule Take 200 mg by mouth daily before breakfast.      . nitroGLYCERIN (NITROSTAT) 0.4 MG SL tablet Place 0.4 mg under the tongue every 5 (five) minutes x 3 doses as needed for chest pain.    . pioglitazone (ACTOS) 45 MG tablet Take 45 mg by mouth once a week.     Marland Kitchen PRADAXA 150 MG CAPS capsule TAKE ONE CAPSULE BY MOUTH TWICE DAILY 60 capsule 0  . quinapril (ACCUPRIL) 40 MG tablet Take 1 tablet (40 mg total) by mouth at bedtime. 90 tablet 3   No current facility-administered medications for this visit.      Past Medical History:  Diagnosis Date  . Arthritis   . Atrial fibrillation ([place])   . Back pain   . CAD (coronary artery disease)   . DM (diabetes mellitus) ([place])   . Elevated PSA   . Gout   . Hearing problem   .  HTN (hypertension)   . Hyperlipidemia   . Prostatitis   . Renal insufficiency     ROS:   All systems reviewed and negative except as noted in the HPI.   Past Surgical History:  Procedure Laterality Date  . CARDIAC CATHETERIZATION  11/07/07   with 2 stents  . 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    Marland Kitchen EXTERNAL EAR SURGERY Right    cartilage  . LEFT AND RIGHT HEART CATHETERIZATION WITH CORONARY/GRAFT ANGIOGRAM N/A 09/27/2014   Procedure: LEFT AND RIGHT HEART CATHETERIZATION WITH Beatrix Fetters;  Surgeon: Sinclair Grooms, MD;  Location: [facility] C[facility] LAB;  Service: Cardiovascular;  Laterality: N/A;  . VASECTOMY       Family History  Problem Relation Age of Onset  . Hypertension Mother   . Gout Mother   . Alzheimer's disease Mother   . Heart failure Father   . Heart disease Brother   . Cancer Brother     type unknown     Social History   Social History  . Marital status: Married    Spouse name: N/A  . Number of children: 2  . Years of education: N/A   Occupational History  . SERV  [employer]   Social History Main Topics  . Smoking status: Never Smoker  . Smokeless tobacco: Never Used  . Alcohol use No  . Drug use: No  . Sexual activity: Not on file   Other Topics Concern  . Not on file   Social History Narrative  . No narrative on file     BP (!) 136/92   Pulse (!) 52   Ht 5\' 8"  (1.727 m)   Wt 278 lb 12.8 oz (126.5 kg)   BMI 42.39 kg/m   Physical Exam:  Well appearing but obese 65 yo man, NAD HEENT: Unremarkable Neck:  No JVD, no thyromegally Back:  No CVA tenderness Lungs:  Clear with no wheezes HEART:  Regular rate rhythm, no murmurs, no rubs, no clicks Abd:  soft, positive bowel sounds, no organomegally, no rebound, no guarding Ext:  2 plus pulses, trace peripheral edema, no cyanosis, no clubbing Skin:  No rashes no nodules Neuro:  CN II through XII intact, motor grossly  intact  ECG - nsr with PAC's.   Assess/Plan: 1. PAF - he is maintaining NSR. No change in meds. 2. HTN - his blood pressure is a bit high but has been better at home. He is encouraged to maintain a low sodium diet and to lose weight. 3. CAD - he denies anginal symptoms. Will follow.  Mikle Bosworth.D.

## 2016-04-24 NOTE — Patient Instructions (Signed)

## 2016-06-23 ENCOUNTER — Encounter (HOSPITAL_COMMUNITY): Payer: Self-pay

## 2016-06-23 ENCOUNTER — Observation Stay (HOSPITAL_COMMUNITY)
Admission: EM | Admit: 2016-06-23 | Discharge: 2016-06-24 | Disposition: A | Discharge status: Home / Self Care | Payer: Medicare HMO | Attending: Internal Medicine | Admitting: Internal Medicine

## 2016-06-23 ENCOUNTER — Emergency Department (HOSPITAL_COMMUNITY): Payer: Medicare HMO

## 2016-06-23 DIAGNOSIS — R079 Chest pain, unspecified: Secondary | ICD-10-CM | POA: Diagnosis not present

## 2016-06-23 DIAGNOSIS — E119 Type 2 diabetes mellitus without complications: Secondary | ICD-10-CM | POA: Diagnosis not present

## 2016-06-23 DIAGNOSIS — I10 Essential (primary) hypertension: Secondary | ICD-10-CM | POA: Diagnosis present

## 2016-06-23 DIAGNOSIS — I2510 Atherosclerotic heart disease of native coronary artery without angina pectoris: Secondary | ICD-10-CM | POA: Diagnosis not present

## 2016-06-23 DIAGNOSIS — R0789 Other chest pain: Secondary | ICD-10-CM | POA: Diagnosis not present

## 2016-06-23 DIAGNOSIS — Z7982 Long term (current) use of aspirin: Secondary | ICD-10-CM | POA: Diagnosis not present

## 2016-06-23 DIAGNOSIS — Z7901 Long term (current) use of anticoagulants: Secondary | ICD-10-CM | POA: Diagnosis not present

## 2016-06-23 DIAGNOSIS — I480 Paroxysmal atrial fibrillation: Secondary | ICD-10-CM | POA: Diagnosis not present

## 2016-06-23 DIAGNOSIS — I252 Old myocardial infarction: Secondary | ICD-10-CM | POA: Insufficient documentation

## 2016-06-23 DIAGNOSIS — I2583 Coronary atherosclerosis due to lipid rich plaque: Secondary | ICD-10-CM | POA: Insufficient documentation

## 2016-06-23 DIAGNOSIS — M1990 Unspecified osteoarthritis, unspecified site: Secondary | ICD-10-CM | POA: Diagnosis not present

## 2016-06-23 DIAGNOSIS — E785 Hyperlipidemia, unspecified: Secondary | ICD-10-CM | POA: Insufficient documentation

## 2016-06-23 HISTORY — DX: Old myocardial infarction: I25.2

## 2016-06-23 HISTORY — DX: Unspecified atrial flutter: I48.92

## 2016-06-23 LAB — BASIC METABOLIC PANEL
Anion gap: 11 (ref 5–15)
BUN: 28 mg/dL — ABNORMAL HIGH (ref 6–20)
CHLORIDE: 107 mmol/L (ref 101–111)
CO2: 21 mmol/L — AB (ref 22–32)
CREATININE: 1.28 mg/dL — AB (ref 0.61–1.24)
Calcium: 10.8 mg/dL — ABNORMAL HIGH (ref 8.9–10.3)
GFR calc non Af Amer: 56 mL/min — ABNORMAL LOW (ref >60)
GLUCOSE: 124 mg/dL — AB (ref 65–99)
Potassium: 4.6 mmol/L (ref 3.5–5.1)
Sodium: 139 mmol/L (ref 135–145)

## 2016-06-23 LAB — CBC
HCT: 43.9 % (ref 39.0–52.0)
HEMOGLOBIN: 14.3 g/dL (ref 13.0–17.0)
MCH: 30.4 pg (ref 26.0–34.0)
MCHC: 32.6 g/dL (ref 30.0–36.0)
MCV: 93.2 fL (ref 78.0–100.0)
Platelets: 189 10*3/uL (ref 150–400)
RBC: 4.71 MIL/uL (ref 4.22–5.81)
RDW: 14.2 % (ref 11.5–15.5)
WBC: 7.9 10*3/uL (ref 4.0–10.5)

## 2016-06-23 LAB — I-STAT TROPONIN, ED: Troponin i, poc: 0 ng/mL (ref 0.00–0.08)

## 2016-06-23 LAB — TROPONIN I: Troponin I: <0.03 ng/mL (ref <0.03)

## 2016-06-23 MED ORDER — ACETAMINOPHEN 500 MG PO TABS
1000.0000 mg | ORAL_TABLET | Freq: Once | ORAL | Status: AC
  Administered 2016-06-23: 1000 mg via ORAL
  Filled 2016-06-23: qty 2

## 2016-06-23 MED ORDER — ALUM & MAG HYDROXIDE-SIMETH 200-200-20 MG/5ML PO SUSP
30.0000 mL | Freq: Once | ORAL | Status: AC
  Administered 2016-06-23: 30 mL via ORAL
  Filled 2016-06-23: qty 30

## 2016-06-23 MED ORDER — NITROGLYCERIN 0.4 MG SL SUBL: 0.4000 mg | SUBLINGUAL_TABLET | SUBLINGUAL | Status: DC | PRN

## 2016-06-23 MED ORDER — LIDOCAINE VISCOUS 2 % MT SOLN
15.0000 mL | Freq: Once | OROMUCOSAL | Status: AC
  Administered 2016-06-23: 15 mL via OROMUCOSAL
  Filled 2016-06-23: qty 15

## 2016-06-23 MED ORDER — NITROGLYCERIN 2 % TD OINT
1.0000 [in_us] | TOPICAL_OINTMENT | Freq: Four times a day (QID) | TRANSDERMAL | Status: DC
  Administered 2016-06-23 (×2): 1 [in_us] via TOPICAL
  Filled 2016-06-23 (×2): qty 1

## 2016-06-23 NOTE — ED Provider Notes (Signed)
[facility] DEPT Provider Note   CSN: [id number] Arrival date & time: 06/23/16  1727     History   Chief Complaint Chief Complaint  Patient presents with  . Chest Pain    HPI Joshua [name] is a 65 y.o. male.  HPI Chest pain started at 2:30 pm. Took tums for concern of indigestion however did not help. Endorsed as heavy pressure located in the center of the chest. Known CAD w/ MI in 2009 s/p 2 stents on pradaxa and asa. Nitro and asa chewed today and nitro did not help. No relieving or aggravating factors upon exertion. Denies fevers, nausea or emesis. Denies back pain or abdominal pain.  Cardiologist is Dr. Lovena Le who sees him for Afib s/p ablation. Echo in 2015 w/ LV EF 50-55%. Last stress test in 2015.  Past Medical History:  Diagnosis Date  . Arthritis   . Atrial fibrillation ([place])   . Atrial flutter ([place])   . Back pain   . CAD (coronary artery disease)   . DM (diabetes mellitus) ([place])   . Elevated PSA   . Gout   . Hearing problem   . HTN (hypertension)   . Hyperlipidemia   . Myocardial infarct, old 2010  . Prostatitis   . Renal insufficiency     Patient Active Problem List   Diagnosis Date Noted  . Chest pain, unspecified 06/23/2016  . Abnormal nuclear stress test 09/27/2014  . Crescendo angina ([place]) 09/26/2014  . Pulmonary hypertension 09/26/2014  . Internal hemorrhoids 01/19/2014  . Diverticulosis of colon without hemorrhage 01/19/2014  . Paroxysmal atrial fibrillation ([place]) 04/29/2010  . PALPITATIONS 04/21/2010  . HEMORRHOIDS-EXTERNAL 11/01/2009  . HYPERKALEMIA 09/09/2009  . DM 08/16/2009  . HYPERLIPIDEMIA 08/16/2009  . Essential hypertension 08/16/2009  . Coronary artery disease due to lipid rich plaque 08/16/2009  . RENAL INSUFFICIENCY 08/16/2009    Past Surgical History:  Procedure Laterality Date  . CARDIAC CATHETERIZATION  11/07/07   with 2 stents  . 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     Marland Kitchen EXTERNAL EAR SURGERY Right    cartilage  . LEFT AND RIGHT HEART CATHETERIZATION WITH CORONARY/GRAFT ANGIOGRAM N/A 09/27/2014   Procedure: LEFT AND RIGHT HEART CATHETERIZATION WITH Beatrix Fetters;  Surgeon: Sinclair Grooms, MD;  Location: [facility] CATH LAB;  Service: Cardiovascular;  Laterality: N/A;  . VASECTOMY         Home Medications    Prior to Admission medications   Medication Sig Start Date End Date Taking? Authorizing Provider  amLODipine (NORVASC) 5 MG tablet Take 1 tablet (5 mg total) by mouth daily. 04/24/16  Yes Evans Lance, MD  aspirin 81 MG tablet Take 81 mg by mouth daily.     Yes Historical Provider, MD  atenolol (TENORMIN) 50 MG tablet Take 1 tablet (50 mg total) by mouth 2 (two) times daily. 04/24/16  Yes Evans Lance, MD  atorvastatin (LIPITOR) 40 MG tablet Take 1 tablet (40 mg total) by mouth daily. 04/24/16  Yes Evans Lance, MD  dabigatran (PRADAXA) 150 MG CAPS capsule Take 1 capsule (150 mg total) by mouth 2 (two) times daily. 04/24/16  Yes Evans Lance, MD  fenofibrate micronized (LOFIBRA) 200 MG capsule Take 200 mg by mouth at bedtime.    Yes Historical Provider, MD  nitroGLYCERIN (NITROSTAT) 0.4 MG SL tablet Place 0.4 mg under the tongue every 5 (five) minutes x 3 doses as needed for chest pain.  Yes Historical Provider, MD  pioglitazone (ACTOS) 45 MG tablet Take 45 mg by mouth every Wednesday.    Yes Historical Provider, MD  quinapril (ACCUPRIL) 40 MG tablet Take 1 tablet (40 mg total) by mouth at bedtime. 04/24/16  Yes Evans Lance, MD  esomeprazole (NEXIUM) 40 MG capsule Take 1 capsule (40 mg total) by mouth daily at 12 noon. 06/24/16   Mauricio Gerome Apley, MD    Family History Family History  Problem Relation Age of Onset  . Hypertension Mother   . Gout Mother   . Alzheimer's disease Mother   . Heart failure Father   . Heart disease Brother   . Cancer Brother     type unknown    Social History Social History  Substance Use  Topics  . Smoking status: Never Smoker  . Smokeless tobacco: Former Systems developer  . Alcohol use No     Allergies   Niacin   Review of Systems Review of Systems  Constitutional: Positive for fatigue.  Cardiovascular: Positive for chest pain and leg swelling.  All other systems reviewed and are negative.   Physical Exam Updated Vital Signs BP 134/77 (BP Location: Right Arm)   Pulse 63   Temp 97.6 F (36.4 C) (Oral)   Resp 16   Ht 5\' 8"  (1.727 m)   Wt 127.6 kg   SpO2 96%   BMI 42.77 kg/m   Physical Exam  Constitutional: He is oriented to person, place, and time. He appears well-developed and well-nourished.  Non-toxic appearance. He does not appear ill. No distress.  HENT:  Head: Normocephalic and atraumatic.  Right Ear: External ear normal.  Left Ear: External ear normal.  Mouth/Throat: Oropharynx is clear and moist.  Eyes: EOM are normal. Pupils are equal, round, and reactive to light. No scleral icterus.  Neck: Normal range of motion. Neck supple. No JVD present. No tracheal deviation present.  Cardiovascular: Normal heart sounds and intact distal pulses.   No murmur heard. Pulmonary/Chest: Effort normal and breath sounds normal. No stridor. No respiratory distress. He has no wheezes. He has no rales.  Abdominal: Soft. Bowel sounds are normal. He exhibits no distension. There is no tenderness. There is no rebound and no guarding.  Musculoskeletal: Normal range of motion. He exhibits edema. He exhibits no deformity.  Neurological: He is alert and oriented to person, place, and time. He has normal strength and normal reflexes. No cranial nerve deficit or sensory deficit.  Skin: Skin is warm and dry. Capillary refill takes less than 2 seconds.  Psychiatric: He has a normal mood and affect. His behavior is normal.  Nursing note and vitals reviewed.    ED Treatments / Results  Labs (all labs ordered are listed, but only abnormal results are displayed) Labs Reviewed  BASIC  METABOLIC PANEL - Abnormal; Notable for the following:       Result Value   CO2 21 (*)    Glucose, Bld 124 (*)    BUN 28 (*)    Creatinine, Ser 1.28 (*)    Calcium 10.8 (*)    GFR calc non Af Amer 56 (*)    All other components within normal limits  GLUCOSE, CAPILLARY - Abnormal; Notable for the following:    Glucose-Capillary 148 (*)    All other components within normal limits  GLUCOSE, CAPILLARY - Abnormal; Notable for the following:    Glucose-Capillary 122 (*)    All other components within normal limits  MRSA PCR SCREENING  CBC  TROPONIN I  TROPONIN I  TROPONIN I  I-STAT TROPOININ, ED    EKG  EKG Interpretation  Date/Time:  Tuesday June 23 2016 17:43:37 EDT Ventricular Rate:  63 PR Interval:  176 QRS Duration: 88 QT Interval:  406 QTC Calculation: 415 R Axis:   53 Text Interpretation:  Normal sinus rhythm Normal ECG Confirmed by Lacinda Axon  MD, BRIAN ([id number]) on 06/24/2016 3:37:51 PM       Radiology No results found.  Procedures Procedures (including critical care time)  Medications Ordered in ED Medications  acetaminophen (TYLENOL) tablet 1,000 mg (1,000 mg Oral Given 06/23/16 1846)  lidocaine (XYLOCAINE) 2 % viscous mouth solution 15 mL (15 mLs Mouth/Throat Given 06/23/16 1856)  alum & mag hydroxide-simeth (MAALOX/MYLANTA) 200-200-20 MG/5ML suspension 30 mL (30 mLs Oral Given 06/23/16 1846)     Initial Impression / Assessment and Plan / ED Course  I have reviewed the triage vital signs and the nursing notes.  Pertinent labs & imaging results that were available during my care of the patient were reviewed by me and considered in my medical decision making (see chart for details).  Clinical Course   Patient presents to the emergency department for assessment of chest pain with previous history of ACS and last reported stress test in 2015. Patient HD stable. Afebrile. Due to concern had for ACS therefore I obtained an EKG which upon my review shows no  evidence of acute STEMI. Patient has no ST segment abnormalities no new T-wave inversions no evidence of hyperkalemia or preexcitation syndrome and ECG is normal sinus rhythm. I have considered and estimate there is low risk for PE, PNA, Perforated viscous, Aortic dissection or pancreatitis as the patient's presentation is not consistent.   I obtained chest x-ray and upon review appreciate no evidence of acute cardiac or pulmonary abnormalities. I provided the patient with nitroglycerin however did not provide aspirin as patient received 4 aspirin from EMS prior to arrival.  Troponin negative x2. Labs unremarkable. I therefore contacted the Cardiology Service for assistance in the patient's care. After thorough discussion of the patient's ED course, we are in agreement that the patient requires admission and stress testing in AM. Therefore admitted to Hospitalist service in stable condition.  Final Clinical Impressions(s) / ED Diagnoses   Final diagnoses:  Chest pain, unspecified chest pain type    New Prescriptions Discharge Medication List as of 06/24/2016  2:29 PM    START taking these medications   Details  esomeprazole (NEXIUM) 40 MG capsule Take 1 capsule (40 mg total) by mouth daily at 12 noon., Starting Wed 06/24/2016, Normal         Voncille Lo, MD 06/29/16 2050    Orlie Dakin, MD 06/29/16 2358

## 2016-06-23 NOTE — ED Notes (Addendum)
Report called to Reka, RN at this time.  Receiving nurse denies having any further questions at this time.

## 2016-06-23 NOTE — ED Provider Notes (Signed)
Developed anterior chest pressure onset 2:30 PM today. Constant. Feels like indigestion.. No other associated symptoms. Discomfort is minimal at present. On exam no distress lungs clear auscultation heart regular rate and rhythm abdomen obese, nontender extremities without edema   Orlie Dakin, MD 06/24/16 [id number]

## 2016-06-23 NOTE — ED Notes (Signed)
Attempted to call report at this time 

## 2016-06-23 NOTE — H&P (Signed)
History and Physical  Patient Name: Joshua [name]     [id number]    DOB: [birth year]    DOA: 06/23/2016 PCP: Donnajean Lopes, MD   Patient coming from: Home     Chief Complaint: Chest pain  HPI: Joshua [name] is a 65 y.o. male with a past medical history significant for pAF on dabigatran, HTN, CAD s/p PCI and NIDDM who presents with chest pain.  The patient was in his usual state of health until this afternoon at work when he was sitting down and had onset of new substernal chest discomfort. This is moderate in intensity, located in the central chest, similar to a tightness, associated with a feeling "almost like I'm sweating" and unlike any previous angina he has had. He tried taking some Rolaids because he had just eaten some peanuts but that did not help.   He drove himself home from [place] to [place], and the pain did not improve with 2 nitroglycerin at home or 325 aspirin. Eventually called 911, who administered more nitroglycerin en route which also did not help. The pain is not positional, not necessarily worse with exertion, not precipitated by food, nor improved with Maalox and viscous lidocaine in the ER.  ED course: -Afebrile, heart rate and respirations, blood pressure and pulse oximetry normal -Initial ECG showed sinus rhythm, unchanged from previous and troponin was negative 2. -Na 139, K 4.6, Cr 1.28 (baseline 1.2), WBC 7.9, Hgb 14 -CXR showed nonspecific bibasilar streaky markings, consistent with atelectasis -The case was discussed with cardiology fellow on-call who recommended stress test in the morning -TRH was asked to admit for observation, serial troponins and risk stratification.     Review of Systems:  All other systems negative except as just noted or noted in the history of present illness.  Past Medical History:  Diagnosis Date  . Arthritis   . Atrial fibrillation ([place])   . Atrial flutter ([place])   . Back pain   . CAD (coronary artery disease)    . DM (diabetes mellitus) ([place])   . Elevated PSA   . Gout   . Hearing problem   . HTN (hypertension)   . Hyperlipidemia   . Myocardial infarct, old 2010  . Prostatitis   . Renal insufficiency     Past Surgical History:  Procedure Laterality Date  . CARDIAC CATHETERIZATION  11/07/07   with 2 stents  . 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    Marland Kitchen EXTERNAL EAR SURGERY Right    cartilage  . LEFT AND RIGHT HEART CATHETERIZATION WITH CORONARY/GRAFT ANGIOGRAM N/A 09/27/2014   Procedure: LEFT AND RIGHT HEART CATHETERIZATION WITH Beatrix Fetters;  Surgeon: Sinclair Grooms, MD;  Location: [facility] CATH LAB;  Service: Cardiovascular;  Laterality: N/A;  . VASECTOMY      Social History: Patient lives with his wife.  Patient walks unassisted.  He is not a smoker.  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  He is from [place] originally.    Allergies  Allergen Reactions  . Niacin Itching    Family history: family history includes Alzheimer's disease in his mother; Cancer in his brother; Gout in his mother; Heart disease in his brother; Heart failure in his father; Hypertension in his mother.  Prior to Admission medications   Medication Sig Start Date End Date Taking? Authorizing Provider  amLODipine (NORVASC) 5 MG tablet Take 1 tablet (5 mg total) by mouth daily. 04/24/16  Yes Carleene Overlie  Peyton Najjar, MD  aspirin 81 MG tablet Take 81 mg by mouth daily.     Yes Historical Provider, MD  atenolol (TENORMIN) 50 MG tablet Take 1 tablet (50 mg total) by mouth 2 (two) times daily. 04/24/16  Yes Evans Lance, MD  atorvastatin (LIPITOR) 40 MG tablet Take 1 tablet (40 mg total) by mouth daily. 04/24/16  Yes Evans Lance, MD  dabigatran (PRADAXA) 150 MG CAPS capsule Take 1 capsule (150 mg total) by mouth 2 (two) times daily. 04/24/16  Yes Evans Lance, MD  fenofibrate micronized (LOFIBRA) 200 MG capsule Take 200 mg by mouth at bedtime.    Yes Historical  Provider, MD  nitroGLYCERIN (NITROSTAT) 0.4 MG SL tablet Place 0.4 mg under the tongue every 5 (five) minutes x 3 doses as needed for chest pain.   Yes Historical Provider, MD  pioglitazone (ACTOS) 45 MG tablet Take 45 mg by mouth every Wednesday.    Yes Historical Provider, MD  quinapril (ACCUPRIL) 40 MG tablet Take 1 tablet (40 mg total) by mouth at bedtime. 04/24/16  Yes Evans Lance, MD       Physical Exam: BP 118/67   Pulse 66   Temp 98 F (36.7 C)   Resp 22   SpO2 93%  General appearance: Well-developed, adult male, alert and in no acute distress.   Eyes: Anicteric, conjunctiva pink, lids and lashes normal.     ENT: No nasal deformity, discharge, or epistaxis.  OP moist without lesions.   Skin: Warm and dry.   Cardiac: RRR, nl S1-S2, no murmurs appreciated.  Capillary refill is brisk.  JVP normal.  Trace pretibial LE edema.  Radial and DP pulses 2+ and symmetric.  No carotid bruits. Respiratory: Normal respiratory rate and rhythm.  CTAB without rales or wheezes. GI: Abdomen soft without rigidity.  No TTP. No ascites, distension.   MSK: No deformities or effusions.   Pain not reproduced with palpation of precordium.  No pain with arm movement. Neuro: Sensorium intact and responding to questions, attention normal.  Speech is fluent.  Moves all extremities equally and with normal coordination.    Psych: Behavior appropriate.  Affect normal.  No evidence of aural or visual hallucinations or delusions.       Labs on Admission:  The metabolic panel shows normal electrolytes and mild chronic renal insufficiency. The complete blood count shows no leukocytosis, anemia, thrombocytopenia. The initial troponin is negative.  Radiological Exams on Admission: Personally reviewed chest x-ray shows no focal opacities or pneumonia: Dg Chest 2 View  Result Date: 06/23/2016 CLINICAL DATA:  Chest pain EXAM: CHEST  2 VIEW COMPARISON:  05/02/2010 chest radiograph. FINDINGS: Stable  cardiomediastinal silhouette with normal heart size. No pneumothorax. No pleural effusion. Mild bibasilar atelectasis. No pulmonary edema. No acute consolidative airspace disease. IMPRESSION: Mild bibasilar atelectasis. Otherwise no active disease in the chest. Electronically Signed   By: Ilona Sorrel M.D.   On: 06/23/2016 18:49    EKG: Independently reviewed. Rate 63, QTC 415, normal sinus rhythm.  Nuclear perfusion study 2015: High risk Left heart cath subsequently showed diffuse disease, nothing greater than 50%, no intervention.        Assessment/Plan 1. Chest pain: This is new.  The patient has HEART score of 5. His pain is not like previous angina.  Other potential causes of chest pain (PE, dissection, pancreatitis, pneumonia/effusion, pericarditis) are doubted.  We have been asked to admit the patient for observation and etiology consultation with Cardiology tomorrow.  -  Serial troponins are ordered -Telemetry -Echocardiogram ordered -Consult to cardiology, appreciate recommendations    2. HTN:  -Continue amlodipine, atenolol, atorvastatin, quinapril  3. Paroxysmal atrial fibrillation:  CHADS2-VASc 3. On dabigatran. -Continue dabigatran and atenolol  4. NIDDM:  -Hold Actos -SSI    DVT prophylaxis: None, on dabigatran Diet: NPO after 4am for anticipated stress testing Code Status: Full  Family Communication: Wife at bedside, an opportunity for questions was given and all questions were answered. Disposition Plan: Anticipate overnight observation for arrhythmia on telemetry, serial troponins and subsequent risk stratification by Cardiology.  If testing negative, home after. Consults called: Cardiology Admission status: Telemetry, observation status   Medical decision making: Patient seen at 11:30 PM on 06/23/2016.  The patient was discussed with Dr. Freda Munro. What exists of the patient's chart was reviewed in depth.  Clinical condition: Stable.      Edwin Dada [facility] Pager [phone]

## 2016-06-23 NOTE — ED Triage Notes (Signed)
Per [facility]: Pt is complaining of chest pain that started at 1430. Pt states that the pain started while he was sitting on the couch, pain is substernal and non radiating. Pain is 5/10. Pt describes the pain as tightness. Pt thought it was indigestion, so he took Rolaids, this did not help the pts pain.  Pt took 324 of ASA and 2 nitro prior to EMS arrival. EMS gave an additional 1 nitro. The nitro did not relieve the pts pain.

## 2016-06-24 DIAGNOSIS — R079 Chest pain, unspecified: Secondary | ICD-10-CM | POA: Diagnosis not present

## 2016-06-24 DIAGNOSIS — I10 Essential (primary) hypertension: Secondary | ICD-10-CM | POA: Diagnosis not present

## 2016-06-24 DIAGNOSIS — Z9861 Coronary angioplasty status: Secondary | ICD-10-CM

## 2016-06-24 DIAGNOSIS — I2510 Atherosclerotic heart disease of native coronary artery without angina pectoris: Secondary | ICD-10-CM | POA: Diagnosis not present

## 2016-06-24 DIAGNOSIS — I480 Paroxysmal atrial fibrillation: Secondary | ICD-10-CM | POA: Diagnosis not present

## 2016-06-24 LAB — GLUCOSE, CAPILLARY
GLUCOSE-CAPILLARY: 122 mg/dL — AB (ref 65–99)
Glucose-Capillary: 148 mg/dL — ABNORMAL HIGH (ref 65–99)

## 2016-06-24 LAB — MRSA PCR SCREENING: MRSA by PCR: NEGATIVE

## 2016-06-24 LAB — TROPONIN I: Troponin I: <0.03 ng/mL (ref <0.03)

## 2016-06-24 MED ORDER — QUINAPRIL HCL 10 MG PO TABS
40.0000 mg | ORAL_TABLET | Freq: Every day | ORAL | Status: DC
  Filled 2016-06-24 (×2): qty 4

## 2016-06-24 MED ORDER — ATORVASTATIN CALCIUM 40 MG PO TABS
40.0000 mg | ORAL_TABLET | Freq: Every day | ORAL | Status: DC
  Administered 2016-06-24: 40 mg via ORAL
  Filled 2016-06-24: qty 1

## 2016-06-24 MED ORDER — INSULIN ASPART 100 UNIT/ML ~~LOC~~ SOLN
0.0000 [IU] | SUBCUTANEOUS | Status: DC
  Administered 2016-06-24: 2 [IU] via SUBCUTANEOUS

## 2016-06-24 MED ORDER — GI COCKTAIL ~~LOC~~
30.0000 mL | Freq: Four times a day (QID) | ORAL | Status: DC | PRN
  Filled 2016-06-24: qty 30

## 2016-06-24 MED ORDER — ASPIRIN 81 MG PO CHEW
81.0000 mg | CHEWABLE_TABLET | Freq: Every day | ORAL | Status: DC
  Administered 2016-06-24: 81 mg via ORAL
  Filled 2016-06-24: qty 1

## 2016-06-24 MED ORDER — DABIGATRAN ETEXILATE MESYLATE 150 MG PO CAPS
150.0000 mg | ORAL_CAPSULE | Freq: Two times a day (BID) | ORAL | Status: DC
  Administered 2016-06-24 (×2): 150 mg via ORAL
  Filled 2016-06-24 (×2): qty 1

## 2016-06-24 MED ORDER — ESOMEPRAZOLE MAGNESIUM 40 MG PO CPDR: 40.0000 mg | DELAYED_RELEASE_CAPSULE | Freq: Every day | ORAL | 0 refills | Status: DC

## 2016-06-24 MED ORDER — ATENOLOL 50 MG PO TABS
50.0000 mg | ORAL_TABLET | Freq: Two times a day (BID) | ORAL | Status: DC
  Administered 2016-06-24: 50 mg via ORAL
  Filled 2016-06-24 (×2): qty 1

## 2016-06-24 MED ORDER — ONDANSETRON HCL 4 MG/2ML IJ SOLN: 4.0000 mg | Freq: Four times a day (QID) | INTRAMUSCULAR | Status: DC | PRN

## 2016-06-24 MED ORDER — ACETAMINOPHEN 325 MG PO TABS
650.0000 mg | ORAL_TABLET | ORAL | Status: DC | PRN
  Administered 2016-06-24: 650 mg via ORAL
  Filled 2016-06-24: qty 2

## 2016-06-24 MED ORDER — AMLODIPINE BESYLATE 5 MG PO TABS
5.0000 mg | ORAL_TABLET | Freq: Every day | ORAL | Status: DC
  Administered 2016-06-24: 5 mg via ORAL
  Filled 2016-06-24: qty 1

## 2016-06-24 NOTE — ED Notes (Signed)
Report called to Suzanna, RN at this time.  Receiving nurse denies having any further questions at this time.

## 2016-06-24 NOTE — Progress Notes (Signed)
Offered bath, patient said he would do it his self, got him set-up

## 2016-06-24 NOTE — Consult Note (Signed)
Cardiology Consult    Patient ID: Joshua [name] MRN: [id number], DOB/AGE: 03-20-1951   Admit date: 06/23/2016 Date of Consult: 06/24/2016  Primary Physician: Donnajean Lopes, MD Reason for Consult: Chest pain Primary Cardiologist: Dr. Stanford Breed  Requesting Provider: Dr. Loleta Books  Patient Profile  Joshua [name] is a 65 year old male with a past medical history of paroxysmal atrial fibrillation (on Pradaxa), DM, CAD, HTN, and HLD. Admitted on 06/23/16 for chest pain. Troponin negative x 2.   History of Present Illness  Joshua [name] developed substernal chest pain while at work sitting down. He describes the pain as chest tightness and had associated diaphoresis. He took 2 SL Nitro and his chest pain persisted. He called EMS. EMS gave another SL Nitro, this did not help. The pain is not positional, not necessarily worse with exertion, not precipitated by food, nor improved with Maalox and viscous lidocaine in the ER.  EKG showed NSR, troponin negative x 3. His last heart cath was on 09/27/14. Full report below. He had non obstructive CAD at the time with a widely patent proximal LAD stent, widely patent circumflex, and widely patent RCA.   He denies ever having having any pain like this before, he thinks his symptoms are more likely GERD as he reports having to take TUMS frequently. Currently pain free.   Past Medical History   Past Medical History:  Diagnosis Date  . Arthritis   . Atrial fibrillation ([place])   . Atrial flutter ([place])   . Back pain   . CAD (coronary artery disease)   . DM (diabetes mellitus) ([place])   . Elevated PSA   . Gout   . Hearing problem   . HTN (hypertension)   . Hyperlipidemia   . Myocardial infarct, old 2010  . Prostatitis   . Renal insufficiency     Past Surgical History:  Procedure Laterality Date  . CARDIAC CATHETERIZATION  11/07/07   with 2 stents  . 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    Marland Kitchen EXTERNAL  EAR SURGERY Right    cartilage  . LEFT AND RIGHT HEART CATHETERIZATION WITH CORONARY/GRAFT ANGIOGRAM N/A 09/27/2014   Procedure: LEFT AND RIGHT HEART CATHETERIZATION WITH Beatrix Fetters;  Surgeon: Sinclair Grooms, MD;  Location: [facility] CATH LAB;  Service: Cardiovascular;  Laterality: N/A;  . VASECTOMY       Allergies  Allergies  Allergen Reactions  . Niacin Itching    Inpatient Medications    . amLODipine  5 mg Oral Daily  . aspirin  81 mg Oral Daily  . atenolol  50 mg Oral BID  . atorvastatin  40 mg Oral Daily  . dabigatran  150 mg Oral BID  . insulin aspart  0-15 Units Subcutaneous Q4H  . quinapril  40 mg Oral QHS    Family History    Family History  Problem Relation Age of Onset  . Hypertension Mother   . Gout Mother   . Alzheimer's disease Mother   . Heart failure Father   . Heart disease Brother   . Cancer Brother     type unknown    Social History    Social History   Social History  . Marital status: Married    Spouse name: N/A  . Number of children: 2  . Years of education: N/A   Occupational History  . SERV TECH [employer]   Social History Main Topics  . Smoking status: Never  Smoker  . Smokeless tobacco: Former Systems developer  . Alcohol use No  . Drug use: No  . Sexual activity: Not on file   Other Topics Concern  . Not on file   Social History Narrative  . No narrative on file     Review of Systems    General:  No chills, fever, night sweats or weight changes.  Cardiovascular:  No chest pain, dyspnea on exertion, edema, orthopnea, palpitations, paroxysmal nocturnal dyspnea. Dermatological: No rash, lesions/masses Respiratory: No cough, dyspnea Urologic: No hematuria, dysuria Abdominal:   No nausea, vomiting, diarrhea, bright red blood per rectum, melena, or hematemesis Neurologic:  No visual changes, wkns, changes in mental status. All other systems reviewed and are otherwise negative except as noted above.  Physical Exam    Blood  pressure 128/72, pulse 73, temperature 98.2 F (36.8 C), temperature source Oral, resp. rate 18, height 5\' 8"  (1.727 m), weight 281 lb 4.9 oz (127.6 kg), SpO2 91 %.  General: Pleasant, NAD Psych: Normal affect. Neuro: Alert and oriented X 3. Moves all extremities spontaneously. HEENT: Normal  Neck: Supple without bruits or JVD. Lungs:  Resp regular and unlabored, CTA. Heart: RRR no s3, s4, or murmurs. Abdomen: Soft, non-tender, non-distended, BS + x 4.  Extremities: No clubbing, cyanosis or edema. DP/PT/Radials 2+ and equal bilaterally.  Labs    Troponin [facility] Test)  Recent Labs  06/23/16 1808  TROPIPOC 0.00    Recent Labs  06/23/16 1544 06/23/16 2034 06/24/16 0144  TROPONINI <0.03 <0.03 <0.03   Lab Results  Component Value Date   WBC 7.9 06/23/2016   HGB 14.3 06/23/2016   HCT 43.9 06/23/2016   MCV 93.2 06/23/2016   PLT 189 06/23/2016    Recent Labs Lab 06/23/16 1544  NA 139  K 4.6  CL 107  CO2 21*  BUN 28*  CREATININE 1.28*  CALCIUM 10.8*  GLUCOSE 124*   Lab Results  Component Value Date   CHOL 119 04/29/2010   HDL 24.30 (L) 04/29/2010   LDLCALC 75 04/29/2010   TRIG 97.0 04/29/2010   No results found for: [facility]   Radiology Studies    Dg Chest 2 View  Result Date: 06/23/2016 CLINICAL DATA:  Chest pain EXAM: CHEST  2 VIEW COMPARISON:  05/02/2010 chest radiograph. FINDINGS: Stable cardiomediastinal silhouette with normal heart size. No pneumothorax. No pleural effusion. Mild bibasilar atelectasis. No pulmonary edema. No acute consolidative airspace disease. IMPRESSION: Mild bibasilar atelectasis. Otherwise no active disease in the chest. Electronically Signed   By: Ilona Sorrel M.D.   On: 06/23/2016 18:49    EKG & Cardiac Imaging    EKG: NSR  Echocardiogram: 09/18/14 Study Conclusions  - Left ventricle: The cavity size was normal. Wall thickness was increased in a pattern of mild LVH. Systolic function was normal. The estimated  ejection fraction was in the range of 50% to 55%. Wall motion was normal; there were no regional wall motion abnormalities. - Mitral valve: Calcified annulus. There was mild regurgitation. - Left atrium: The atrium was moderately dilated. - Pulmonary arteries: Systolic pressure was moderately increased. PA peak pressure: 50 mm Hg (S).  Impressions:  - Normal LV function; mild LVH; moderate LAE; mild MR and TR; moderately elevated pulmonary pressure.    Left Heart Catheterization with Coronary Angiography  Report  JOAHAN MATHIESEN  65 y.o.  male 15-Aug-1951  Procedure Date: 09/27/2014 Referring Physician: Crissie Sickles, M.D. Primary Cardiologist: Crissie Sickles, M.D.  INDICATIONS: Unstable angina, recent recurrent atrial  flutter, and high risk myocardial perfusion study.  PROCEDURE: 1. Left heart catheterization; 2. Right heart catheterization; 3. Left ventriculography; 4. Coronary angiography  CONSENT:  The risks, benefits, and details of the procedure were explained in detail to the patient. Risks including death, stroke, heart attack, kidney injury, allergy, limb ischemia, bleeding and radiation injury were discussed.  The patient verbalized understanding and wanted to proceed.  Informed written consent was obtained.  PROCEDURE TECHNIQUE:  After Xylocaine anesthesia a 5 French Slender sheath was placed in the right radial artery with an angiocath and the modified Seldinger technique. An 18-gauge antecubital vein Angiocath was exchanged for a 5 French brachial sheath using the modified Seldinger technique. Double glove technique was used to minimize the possibility of infection. White heart cath was performed with a 5 French balloon tip catheter. Coronary angiography was done using a 5 F JR4 and JL 3.5 cm diagnostic catheters.  Left ventriculography was done using the JR 4 catheter and hand injection.   After reviewing the digital images, the case was terminated. The  circumflex and LAD stents were widely patent. The first diagonal is totally occluded and collateralized. Hemostasis was achieved in the right radial arteriotomy site with a wrist band.   CONTRAST:  Total of 95 cc.  COMPLICATIONS:  None   HEMODYNAMICS:  Aortic pressure 135/80 mmHg; LV pressure 137 over 8 mmHg; LVEDP 13 mmHg; RA 5 mmHg; RV 40/0 mmHg; PA 39/18 mmHg; PCWP(mean) 9 mmHg; Cardiac Output 5.9 L/m; AV gradient absent  ANGIOGRAPHIC DATA:   The left main coronary artery is normal.  The left anterior descending artery is widely patent. The proximal LAD stent is widely patent. The LAD reaches the left ventricular apex. Some diffuse disease is noted in the apical LAD. The first agonal is totally occluded and fills by left to left collaterals. The LAD beyond the proximal stent contains 30-40% narrowing..  The left circumflex artery is is a moderate size vessel giving origin to 3 obtuse marginal branches. The first obtuse marginal is large and contains 50% proximal/ostial narrowing. The circumflex beyond the first obtuse marginal contains 40% narrowing immediately proximal to the mid circumflex stent which is widely patent. The third obtuse marginal is small and free of any significant obstruction.  The right coronary artery is dominant and contains moderate plaque and ectasia throughout the proximal to mid and distal segment. No obstructive lesions are noted. A large PDA and 3 left ventricular branches are noted.Marland Kitchen   LEFT VENTRICULOGRAM:  Left ventricular angiogram was done in the 30 RAO projection and revealed normal left ventricular cavity size and contractility with ejection fraction of 60%.   IMPRESSIONS:  1. Widely patent proximal LAD stent with luminal irregularities up to 30-40% in the proximal to mid LAD. The proximal LAD stent is widely patent. A moderate size first diagonal is totally occluded and filled by left to left collaterals. 2. Widely patent circumflex with no  evidence of in-stent restenosis in the mid vessel. The first obtuse marginal contains 50% narrowing. The mid circumflex contains 40% narrowing. 3. Widely patent dominant right coronary with marked ectasia/irregularity throughout the proximal mid and distal vessel but with no evidence of obstructive disease. 4. Normal left ventricular function with normal end-diastolic pressure   RECOMMENDATION:  Medical therapy which should include rhythm control. It is likely that poorly controlled ventricular rates with physical activity or contributing to angina. No explanation for the inferobasal perfusion abnormality can be found. The reversible mid anterior abnormality is due to occlusion  of the diagonal branch.  Will hold Pradaxa in the a.m but resume it with p.m. dose tomorrow evening (09/28/2014)  Other medications will not be changed.  Eligible for discharge in 2-3 hours this evening if no difficulty with hemostasis..    Assessment & Plan    1. Chest pain: Presents with chest pain that occurred at rest. 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 His pain resolved, but did not initially improve with SL Nitro. He has a history of CAD with remote stenting to his LAD that stent was widely patent in 2015.   Nuclear study in 2015 showed possible anterior ischemia, so he was sent for the cath as described above. Hesitant to order another stress test as it will likely result the same as in 2015. MD to decide on repeat cath, however patient got Pradaxa at 9:30 this morning.   2. HTN: Well controlled on atenolol, amlodipine, and quinapril.   3. HLD: On high intensity statin, continue same. Need updated lipid panel.   Signed, Arbutus Leas, NP 06/24/2016, 8:50 AM Pager: [phone]   I have seen, examined and evaluated the patient this AM along with Jettie Booze, NP.  After reviewing all the available data and chart, we discussed the patients laboratory, study & physical findings as well as symptoms in  detail. I agree with her findings, examination as well as impression recommendations as per our discussion.    65 y/o with h/o CAD- PCI to LAD & Aflutter s/p ablation.  Has not had any anginal Sx in years.  Last Cath in 2015 for abnormal Myoview showed stable CAD with no culprit lesion.  He presented with ~2+ hr of central/lower chest tightness - no relief with NTG x 3. No exacerbation with exertion. His prior Angina was a "pressure" that went into his jaw & L arm associated with dyspnea & diaphoresis.  I do not feel that this current symptom is consistent with ACS related UnstableAngina pain -- 2 hrs of Sx with normal EKG & Troponin Neg x 3 would argue against ACS.   I suspect his Sx are more GI-GERD related -- has been taking TUMS quite a bit for heartburn Sx - usually up higher in the chest/throat, but current episode was lower.  He actually felt that belching improved the Sx.  He is currently CP free & has ruled out for MI -- I think he is relatively low risk for d/c home. Would add PPI We will set up close Cards f/u (likely with an APP, but with Dr. Lovena Le if we are able). -- if he has had recurrent Sx or persistent "GERD" - we may need to consider an ischemic evaluation.  His last Nuc was abnormal with no treatable CAD on Cath -- perhaps a Myoview would not be the best option - would need to decide btw Stress CT or Stress Echo, vs. Cath. Keep f/u appt. With PCP to investigate GI issue   OK to d/c from a Cardiac Perspective.   Glenetta Hew, M.D., M.S. Interventional Cardiologist   Pager # [phone] Phone # [phone] [address] [place], [place] [address]

## 2016-06-24 NOTE — ED Notes (Signed)
Spoke with Dr. Loleta Books regarding pt's chest tightness at this time.  New order for pt to be admitted to a step down unit placed by Dr. Loleta Books.

## 2016-06-24 NOTE — ED Notes (Signed)
Spoke with Santiago Glad, RN charge nurse for 3E at this time and she states that pt is not able to come to [facility] with active chest pain.  Will notify Dr. Loleta Books.

## 2016-06-24 NOTE — ED Notes (Signed)
Notified Dr. Loleta Books of pt's chest tightness at this time.  No new orders received.

## 2016-06-24 NOTE — Discharge Instructions (Signed)
Information on my medicine - Pradaxa® (dabigatran) ° °This medication education was reviewed with me or my healthcare representative as part of my discharge preparation.  The pharmacist that spoke with me during my hospital stay was:  Sarim Rothman T Marcellina Jonsson, RPH ° °Why was Pradaxa® prescribed for you? °Pradaxa® was prescribed for you to reduce the risk of forming blood clots that cause a stroke if you have a medical condition called atrial fibrillation (a type of irregular heartbeat).   ° °What do you Need to know about PradAXa®? °Take your Pradaxa® TWICE DAILY - one capsule in the morning and one tablet in the evening with or without food.  It would be best to take the doses about the same time each day. ° °The capsules should not be broken, chewed or opened - they must be swallowed whole. ° °Do not store Pradaxa in other medication containers - once the bottle is opened the Pradaxa should be used within FOUR months; throw away any capsules that haven’t been by that time. ° °Take Pradaxa® exactly as prescribed by your doctor.  DO NOT stop taking Pradaxa® without talking to the doctor who prescribed the medication.  Stopping without other stroke prevention medication to take the place of Pradaxa may increase your risk of developing a clot that causes a stroke.  Refill your prescription before you run out. ° °After discharge, you should have regular check-up appointments with your healthcare provider that is prescribing your Pradaxa®.  In the future your dose may need to be changed if your kidney function or weight changes by a significant amount. ° °What do you do if you miss a dose? °If you miss a dose, take it as soon as you remember on the same day.  If your next dose is less than 6 hours away, skip the missed dose.  Do not take two doses of PRADAXA at the same time. ° °Important Safety Information °A possible side effect of Pradaxa® is bleeding. You should call your healthcare provider right away if you experience  any of the following: °? Bleeding from an injury or your nose that does not stop. °? Unusual colored urine (red or dark brown) or unusual colored stools (red or black). °? Unusual bruising for unknown reasons. °? A serious fall or if you hit your head (even if there is no bleeding). ° °Some medicines may interact with Pradaxa® and might increase your risk of bleeding or clotting while on Pradaxa®. To help avoid this, consult your healthcare provider or pharmacist prior to using any new prescription or non-prescription medications, including herbals, vitamins, non-steroidal anti-inflammatory drugs (NSAIDs) and supplements. ° °This website has more information on Pradaxa® (dabigatran): https://www.pradaxa.com ° ° ° ° °

## 2016-06-24 NOTE — ED Notes (Signed)
Attempted to medicate pt with PRN medications for his chest tightness.  Pt refused to take PRN GI Cocktail.

## 2016-06-24 NOTE — Discharge Summary (Signed)
Physician Discharge Summary  Joshua [name] [id number] DOB: January 05, 1951 DOA: 06/23/2016  PCP: Donnajean Lopes, MD  Admit date: 06/23/2016 Discharge date: 06/24/2016  Admitted From: Home Disposition: Home  Recommendations for Outpatient Follow-up:  1. Follow up with PCP in 1-2 weeks 2. Patient has been placed on esomeprazole for acid reflux.  Home Health: no  Equipment/Devices: no   Discharge Condition: stable  CODE STATUS: full  Diet recommendation: Heart Healthy / Carb Modified   Brief/Interim Summary: This is a 65 year old gentleman who presents to the hospital with a chief complaint of chest pain. The pain occurred while he was sitting, it was substernal in location, moderate in intensity, no improving or worsening factors. Otherwise patient did not had any angina. Physical examination patient was awake and alert, hemodynamically stable, his lungs were clear to auscultation, heart S1-S2 present rhythmic. Abdomen soft nontender, extremities no edema. His EKG was sinus rhythm with no signs of ischemia. His chest film was negative for infiltrates.  Patient was admitted to hospital with working diagnosis of chest pain to rule out acute coronary syndrome.  1. Chest pain. Patient rule out for acute care syndrome with serial cardiac enzymes. Patient had a recent stress test and cardiac catheterization. His symptoms were consistent with possible acid reflux, patient was placed on proton pump inhibitor. He'll continue his medical therapy for coronary disease including statin, beta blockade, antiplatelet therapy. Cardiology was consulted with recommendations to follow-up as an outpatient.  2. Paroxysmal atrial ablation. Patient continue anticoagulation with the dabigatran, rate controlled with atenolol. Patient was sinus rhythm during this hospital stay.  3. Tablet 2 tablets mellitus. Patient was continued on insulin sliding scale for glucose coverage. Serum glucose 104, 148,  122.   Discharge Diagnoses:  Principal Problem:   Chest pain, unspecified Active Problems:   Essential hypertension   Coronary artery disease due to lipid rich plaque   Paroxysmal atrial fibrillation [facility])    Discharge Instructions  Discharge Instructions    Diet - low sodium heart healthy    Complete by:  As directed    Discharge instructions    Complete by:  As directed    Please follow with primary care in 7 days.   Increase activity slowly    Complete by:  As directed        Medication List    TAKE these medications   amLODipine 5 MG tablet Commonly known as:  NORVASC Take 1 tablet (5 mg total) by mouth daily.   aspirin 81 MG tablet Take 81 mg by mouth daily.   atenolol 50 MG tablet Commonly known as:  TENORMIN Take 1 tablet (50 mg total) by mouth 2 (two) times daily.   atorvastatin 40 MG tablet Commonly known as:  LIPITOR Take 1 tablet (40 mg total) by mouth daily.   dabigatran 150 MG Caps capsule Commonly known as:  PRADAXA Take 1 capsule (150 mg total) by mouth 2 (two) times daily.   esomeprazole 40 MG capsule Commonly known as:  NEXIUM Take 1 capsule (40 mg total) by mouth daily at 12 noon.   fenofibrate micronized 200 MG capsule Commonly known as:  LOFIBRA Take 200 mg by mouth at bedtime.   nitroGLYCERIN 0.4 MG SL tablet Commonly known as:  NITROSTAT Place 0.4 mg under the tongue every 5 (five) minutes x 3 doses as needed for chest pain.   pioglitazone 45 MG tablet Commonly known as:  ACTOS Take 45 mg by mouth every Wednesday.   quinapril 40 MG tablet  Commonly known as:  ACCUPRIL Take 1 tablet (40 mg total) by mouth at bedtime.      Follow-up Information    PATERSON,DANIEL G, MD Follow up in 1 week(s).   Specialty:  Internal Medicine Contact information: [place] [address] [phone]          Allergies  Allergen Reactions  . Niacin Itching    Consultations: cardiology  Procedures/Studies: Dg  Chest 2 View  Result Date: 06/23/2016 CLINICAL DATA:  Chest pain EXAM: CHEST  2 VIEW COMPARISON:  05/02/2010 chest radiograph. FINDINGS: Stable cardiomediastinal silhouette with normal heart size. No pneumothorax. No pleural effusion. Mild bibasilar atelectasis. No pulmonary edema. No acute consolidative airspace disease. IMPRESSION: Mild bibasilar atelectasis. Otherwise no active disease in the chest. Electronically Signed   By: Ilona Sorrel M.D.   On: 06/23/2016 18:49     Subjective: Patient is feeling well, denies any chest pain or dyspnea. Patient has been out of bed. No nausea or vomiting.   Discharge Exam: Vitals:   06/24/16 0737 06/24/16 1200  BP: 128/72 134/77  Pulse: 73   Resp: 18   Temp: 98.2 F (36.8 C) 97.6 F (36.4 C)   Vitals:   06/24/16 0100 06/24/16 0345 06/24/16 0737 06/24/16 1200  BP: 128/94 124/61 128/72 134/77  Pulse: 74 63 73   Resp: 18 (!) 24 18   Temp: 98 F (36.7 C) 98.3 F (36.8 C) 98.2 F (36.8 C) 97.6 F (36.4 C)  TempSrc:  Oral Oral Oral  SpO2: 94% 94% 91%   Weight: 127.6 kg (281 lb 4.9 oz)     Height: 5\' 8"  (1.727 m)       General: Pt is alert, awake, not in acute distress Cardiovascular: RRR, S1/S2 +, no rubs, no gallops Respiratory: CTA bilaterally, no wheezing, no rhonchi Abdominal: Soft, NT, ND, bowel sounds + Extremities: no edema, no cyanosis    The results of significant diagnostics from this hospitalization (including imaging, microbiology, ancillary and laboratory) are listed below for reference.     Microbiology: Recent Results (from the past 240 hour(s))  MRSA PCR Screening     Status: None   Collection Time: 06/24/16  1:27 AM  Result Value Ref Range Status   MRSA by PCR NEGATIVE NEGATIVE Final    Comment:        The GeneXpert MRSA Assay (FDA approved for NASAL specimens only), is one component of a comprehensive MRSA colonization surveillance program. It is not intended to diagnose MRSA infection nor to guide  or monitor treatment for MRSA infections.      Labs: BNP (last 3 results) No results for input(s): BNP in the last 8760 hours. Basic Metabolic Panel:  Recent Labs Lab 06/23/16 1544  NA 139  K 4.6  CL 107  CO2 21*  GLUCOSE 124*  BUN 28*  CREATININE 1.28*  CALCIUM 10.8*   Liver Function Tests: No results for input(s): AST, ALT, ALKPHOS, BILITOT, PROT, ALBUMIN in the last 168 hours. No results for input(s): LIPASE, AMYLASE in the last 168 hours. No results for input(s): AMMONIA in the last 168 hours. CBC:  Recent Labs Lab 06/23/16 1544  WBC 7.9  HGB 14.3  HCT 43.9  MCV 93.2  PLT 189   Cardiac Enzymes:  Recent Labs Lab 06/23/16 1544 06/23/16 2034 06/24/16 0144  TROPONINI <0.03 <0.03 <0.03   BNP: Invalid input(s): POCBNP CBG:  Recent Labs Lab 06/24/16 0827 06/24/16 1149  GLUCAP 148* 122*   D-Dimer No results for input(s):  DDIMER in the last 72 hours. Hgb A1c No results for input(s): HGBA1C in the last 72 hours. Lipid Profile No results for input(s): CHOL, HDL, LDLCALC, TRIG, CHOLHDL, LDLDIRECT in the last 72 hours. Thyroid function studies No results for input(s): TSH, T4TOTAL, T3FREE, THYROIDAB in the last 72 hours.  Invalid input(s): FREET3 Anemia work up No results for input(s): VITAMINB12, FOLATE, FERRITIN, TIBC, IRON, RETICCTPCT in the last 72 hours. Urinalysis    Component Value Date/Time   COLORURINE LT. YELLOW 09/09/2009 1651   APPEARANCEUR CLEAR 09/09/2009 1651   LABSPEC >=1.030 09/09/2009 1651   PHURINE 5.0 09/09/2009 1651   GLUCOSEU NEGATIVE 09/09/2009 1651   BILIRUBINUR NEGATIVE 09/09/2009 1651   KETONESUR NEGATIVE 09/09/2009 1651   UROBILINOGEN 0.2 09/09/2009 1651   NITRITE NEGATIVE 09/09/2009 1651   LEUKOCYTESUR NEGATIVE 09/09/2009 1651   Sepsis Labs Invalid input(s): PROCALCITONIN,  WBC,  LACTICIDVEN Microbiology Recent Results (from the past 240 hour(s))  MRSA PCR Screening     Status: None   Collection Time: 06/24/16   1:27 AM  Result Value Ref Range Status   MRSA by PCR NEGATIVE NEGATIVE Final    Comment:        The GeneXpert MRSA Assay (FDA approved for NASAL specimens only), is one component of a comprehensive MRSA colonization surveillance program. It is not intended to diagnose MRSA infection nor to guide or monitor treatment for MRSA infections.      Time coordinating discharge: Over 45 minutes  SIGNED:   Tawni Millers, MD  [facility] 06/24/2016, 1:38 PM Pager   If 7PM-7AM, please contact night-coverage [email or web address] Password [email or web address]

## 2016-06-24 NOTE — Care Management Obs Status (Signed)
[place] NOTIFICATION   Patient Details  Name: Joshua [name] MRN: [id number] Date of Birth: 11/08/1950   Medicare Observation Status Notification Given:  Yes    Bethena Roys, RN 06/24/2016, 12:29 PM

## 2016-07-07 ENCOUNTER — Encounter: Payer: Self-pay | Admitting: Physician Assistant

## 2016-07-07 ENCOUNTER — Encounter: Payer: Self-pay | Admitting: *Deleted

## 2016-07-07 ENCOUNTER — Ambulatory Visit (INDEPENDENT_AMBULATORY_CARE_PROVIDER_SITE_OTHER): Payer: Medicare HMO | Admitting: Physician Assistant

## 2016-07-07 VITALS — BP 150/86 | HR 59 | Ht 68.0 in | Wt 275.8 lb

## 2016-07-07 DIAGNOSIS — Z7901 Long term (current) use of anticoagulants: Secondary | ICD-10-CM

## 2016-07-07 DIAGNOSIS — I10 Essential (primary) hypertension: Secondary | ICD-10-CM | POA: Diagnosis not present

## 2016-07-07 DIAGNOSIS — I480 Paroxysmal atrial fibrillation: Secondary | ICD-10-CM

## 2016-07-07 MED ORDER — ESOMEPRAZOLE MAGNESIUM 40 MG PO CPDR: 40.0000 mg | DELAYED_RELEASE_CAPSULE | Freq: Every day | ORAL | 5 refills | Status: DC

## 2016-07-07 NOTE — Patient Instructions (Signed)
Medication Instructions:   Your physician recommends that you continue on your current medications as directed. Please refer to the Current Medication list given to you today.   If you need a refill on your cardiac medications before your next appointment, please call your pharmacy.  Labwork: NONE ORDER TODAY    Testing/Procedures:  NONE ORDER TODAY    Follow-Up: Your physician wants you to follow-up in:  IN  [place] will receive a reminder letter in the mail two months in advance. If you don't receive a letter, please call our office to schedule the follow-up appointment.      Any Other Special Instructions Will Be Listed Below (If Applicable).

## 2016-07-07 NOTE — Progress Notes (Signed)
Cardiology Office Note   Date:  07/07/2016   ID:  Joshua [name], DOB 11/28/1950, MRN [id number]  PCP:  Donnajean Lopes, MD  Cardiologist:  Dr Alcide Evener, PA-C   No chief complaint on file.   History of Present Illness: Joshua [name] is a 65 y.o. male with a history of PAF (on Pradaxa), DM, CAD, HTN, HLD gout, elevated PSA.   D/c 09/27 after admission for CP, ez neg MI, PPI added  Joshua [name] presents for post hospital follow-up.  Since discharge from the hospital, he has done very well. His chest pain has completely resolved. He has not had to take any tones. His activity level has been normal. He does not exercise regularly but does yard work including mowing etc. without any difficulty.  He had an episode of atrial fibrillation the night after he was discharged. He was aware that he had been off schedule with his atenolol and other blood pressure medications. This started at 6 PM and lasted about 8 hours. He did not take any extra atenolol, but went back on his home dose of atenolol and did well with this. His symptoms resolved by the next morning, and they have not returned. He has had other episodes since then that are consistent with what he normally gets. They only last a few minutes. The episodes are always associated with pain under the left side of his chin. A Valsalva maneuver sometimes helps. He is aware that he can take an extra atenolol as needed, but has not done so.  Even during the palpitations when he was aware that his heart rate was elevated above normal, he only had mild pain under his left chin, he did not get any chest discomfort and did not feel significantly short of breath.  His respiratory status is at baseline. He denies new dyspnea on exertion, orthopnea or PND. He has not had lower extremity edema.   Past Medical History:  Diagnosis Date  . Arthritis   . Atrial fibrillation ([place])   . Atrial flutter ([place])   . Back pain   . CAD  (coronary artery disease)   . DM (diabetes mellitus) ([place])   . Elevated PSA   . Gout   . Hearing problem   . HTN (hypertension)   . Hyperlipidemia   . Myocardial infarct, old 2010  . Prostatitis   . Renal insufficiency     Past Surgical History:  Procedure Laterality Date  . CARDIAC CATHETERIZATION  11/07/07   with 2 stents  . 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    Marland Kitchen EXTERNAL EAR SURGERY Right    cartilage  . LEFT AND RIGHT HEART CATHETERIZATION WITH CORONARY/GRAFT ANGIOGRAM N/A 09/27/2014   Procedure: LEFT AND RIGHT HEART CATHETERIZATION WITH Beatrix Fetters;  Surgeon: Sinclair Grooms, MD;  Location: [facility] CATH LAB;  Service: Cardiovascular;  Laterality: N/A;  . VASECTOMY      Current Outpatient Prescriptions  Medication Sig Dispense Refill  . amLODipine (NORVASC) 5 MG tablet Take 1 tablet (5 mg total) by mouth daily. 90 tablet 3  . aspirin 81 MG tablet Take 81 mg by mouth daily.      Marland Kitchen atenolol (TENORMIN) 50 MG tablet Take 1 tablet (50 mg total) by mouth 2 (two) times daily. 180 tablet 3  . atorvastatin (LIPITOR) 40 MG tablet Take 1 tablet (40 mg total) by mouth daily. 90 tablet 3  . dabigatran (  PRADAXA) 150 MG CAPS capsule Take 1 capsule (150 mg total) by mouth 2 (two) times daily. 60 capsule 11  . esomeprazole (NEXIUM) 40 MG capsule Take 1 capsule (40 mg total) by mouth daily at 12 noon. 30 capsule 5  . fenofibrate micronized (LOFIBRA) 200 MG capsule Take 200 mg by mouth at bedtime.     . nitroGLYCERIN (NITROSTAT) 0.4 MG SL tablet Place 0.4 mg under the tongue every 5 (five) minutes x 3 doses as needed for chest pain.    . pioglitazone (ACTOS) 45 MG tablet Take 45 mg by mouth every Wednesday.     . quinapril (ACCUPRIL) 40 MG tablet Take 1 tablet (40 mg total) by mouth at bedtime. 90 tablet 3   No current facility-administered medications for this visit.     Allergies:   Niacin    Social History:  The patient   reports that he has never smoked. He has quit using smokeless tobacco. He reports that he does not drink alcohol or use drugs.   Family History:  The patient's family history includes Alzheimer's disease in his mother; Cancer in his brother; Gout in his mother; Heart disease in his brother; Heart failure in his father; Hypertension in his mother.    ROS:  Please see the history of present illness. All other systems are reviewed and negative.    PHYSICAL EXAM: VS:  BP (!) 150/86   Pulse (!) 59   Ht 5\' 8"  (1.727 m)   Wt 275 lb 12.8 oz (125.1 kg)   SpO2 95%   BMI 41.94 kg/m  , BMI Body mass index is 41.94 kg/m. GEN: Well nourished, well developed, male in no acute distress  HEENT: normal for age  Neck: no JVD, no carotid bruit, no masses Cardiac: RRR; no murmur, no rubs, or gallops Respiratory:  clear to auscultation bilaterally, normal work of breathing GI: soft, nontender, nondistended, + BS MS: no deformity or atrophy; no edema; distal pulses are 2+ in all 4 extremities   Skin: warm and dry, no rash Neuro:  Strength and sensation are intact Psych: euthymic mood, full affect   EKG:  EKG is not ordered today.  Echocardiogram: 09/18/14 Study Conclusions - Left ventricle: The cavity size was normal. Wall thickness was increased in a pattern of mild LVH. Systolic function was normal. The estimated ejection fraction was in the range of 50% to 55%. Wall motion was normal; there were no regional wall motion abnormalities. - Mitral valve: Calcified annulus. There was mild regurgitation. - Left atrium: The atrium was moderately dilated. - Pulmonary arteries: Systolic pressure was moderately increased. PA peak pressure: 50 mm Hg (S). Impressions: - Normal LV function; mild LVH; moderate LAE; mild MR and TR; moderately elevated pulmonary pressure.  CATH 09/27/2014  IMPRESSIONS:1. Widely patent proximal LAD stent with luminal irregularities up to 30-40% in the proximal  to mid LAD. The proximal LAD stent is widely patent. A moderate size first diagonal is totally occluded and filled by left to left collaterals. 2. Widely patent circumflex with no evidence of in-stent restenosis in the mid vessel. The first obtuse marginal contains 50% narrowing. The mid circumflex contains 40% narrowing. 3. Widely patent dominant right coronary with marked ectasia/irregularity throughout the proximal mid and distal vessel but with no evidence of obstructive disease. 4. Normal left ventricular function with normal end-diastolic pressure   Recent Labs: 06/23/2016: BUN 28; Creatinine, Ser 1.28; Hemoglobin 14.3; Platelets 189; Potassium 4.6; Sodium 139    Lipid Panel  Component Value Date/Time   CHOL 119 04/29/2010 0811   TRIG 97.0 04/29/2010 0811   HDL 24.30 (L) 04/29/2010 0811   CHOLHDL 5 04/29/2010 0811   VLDL 19.4 04/29/2010 0811   LDLCALC 75 04/29/2010 0811     Wt Readings from Last 3 Encounters:  07/07/16 275 lb 12.8 oz (125.1 kg)  06/24/16 281 lb 4.9 oz (127.6 kg)  04/24/16 278 lb 12.8 oz (126.5 kg)     Other studies Reviewed: Additional studies/ records that were reviewed today include: Office visits and hospital records.  ASSESSMENT AND PLAN:  1.  PAF: He is very familiar with the symptoms. He had a prolonged episode the other night, but had been off of his normally scheduled timolol because of being in the hospital. He is back on his atenolol 50 mg twice a day and does well with this.  2. Chronic anticoagulation: CHA2DS2VASc=4 (age x 1, HTN, DM, CAD). No bleeding issues, continue Pradaxa  3. Hypertension: His blood pressure was initially elevated at 150/80. I rechecked it after we have been talking he minutes, and the systolic was [id number]. He does not normally check his blood pressure at home, but has not been told recently that it was elevated when he went to any doctors appointments.   Current medicines are reviewed at length with the patient today.  The  patient does not have concerns regarding medicines.  The following changes have been made:  no change  Labs/ tests ordered today include:  No orders of the defined types were placed in this encounter.    Disposition:   FU with Dr. Lovena Le. He prefers to follow-up at [facility] only if at all possible  Signed, Lenoard Aden  07/07/2016 1:31 PM    [place] Phone: [phone]; Fax: [phone]  This note was written with the assistance of speech recognition software. Please excuse any transcriptional errors.

## 2016-07-12 DIAGNOSIS — R69 Illness, unspecified: Secondary | ICD-10-CM | POA: Diagnosis not present

## 2016-07-31 DIAGNOSIS — R69 Illness, unspecified: Secondary | ICD-10-CM | POA: Diagnosis not present

## 2016-07-31 DIAGNOSIS — I480 Paroxysmal atrial fibrillation: Secondary | ICD-10-CM | POA: Diagnosis not present

## 2016-07-31 DIAGNOSIS — E1129 Type 2 diabetes mellitus with other diabetic kidney complication: Secondary | ICD-10-CM | POA: Diagnosis not present

## 2016-07-31 DIAGNOSIS — R05 Cough: Secondary | ICD-10-CM | POA: Diagnosis not present

## 2016-07-31 DIAGNOSIS — Z6841 Body Mass Index (BMI) 40.0 and over, adult: Secondary | ICD-10-CM | POA: Diagnosis not present

## 2016-07-31 DIAGNOSIS — I10 Essential (primary) hypertension: Secondary | ICD-10-CM | POA: Diagnosis not present

## 2016-10-09 DIAGNOSIS — R972 Elevated prostate specific antigen [PSA]: Secondary | ICD-10-CM | POA: Diagnosis not present

## 2016-10-16 DIAGNOSIS — R350 Frequency of micturition: Secondary | ICD-10-CM | POA: Diagnosis not present

## 2016-10-16 DIAGNOSIS — R972 Elevated prostate specific antigen [PSA]: Secondary | ICD-10-CM | POA: Diagnosis not present

## 2016-12-18 DIAGNOSIS — H11423 Conjunctival edema, bilateral: Secondary | ICD-10-CM | POA: Diagnosis not present

## 2016-12-18 DIAGNOSIS — H2513 Age-related nuclear cataract, bilateral: Secondary | ICD-10-CM | POA: Diagnosis not present

## 2016-12-18 DIAGNOSIS — H18413 Arcus senilis, bilateral: Secondary | ICD-10-CM | POA: Diagnosis not present

## 2016-12-18 DIAGNOSIS — H11153 Pinguecula, bilateral: Secondary | ICD-10-CM | POA: Diagnosis not present

## 2016-12-18 DIAGNOSIS — H04123 Dry eye syndrome of bilateral lacrimal glands: Secondary | ICD-10-CM | POA: Diagnosis not present

## 2016-12-18 DIAGNOSIS — D3131 Benign neoplasm of right choroid: Secondary | ICD-10-CM | POA: Diagnosis not present

## 2016-12-18 DIAGNOSIS — Z7984 Long term (current) use of oral hypoglycemic drugs: Secondary | ICD-10-CM | POA: Diagnosis not present

## 2016-12-18 DIAGNOSIS — E119 Type 2 diabetes mellitus without complications: Secondary | ICD-10-CM | POA: Diagnosis not present

## 2016-12-18 DIAGNOSIS — H1045 Other chronic allergic conjunctivitis: Secondary | ICD-10-CM | POA: Diagnosis not present

## 2016-12-18 DIAGNOSIS — H25013 Cortical age-related cataract, bilateral: Secondary | ICD-10-CM | POA: Diagnosis not present

## 2017-01-19 DIAGNOSIS — H2513 Age-related nuclear cataract, bilateral: Secondary | ICD-10-CM | POA: Diagnosis not present

## 2017-01-19 DIAGNOSIS — D3131 Benign neoplasm of right choroid: Secondary | ICD-10-CM | POA: Diagnosis not present

## 2017-02-19 DIAGNOSIS — E1129 Type 2 diabetes mellitus with other diabetic kidney complication: Secondary | ICD-10-CM | POA: Diagnosis not present

## 2017-02-19 DIAGNOSIS — M109 Gout, unspecified: Secondary | ICD-10-CM | POA: Diagnosis not present

## 2017-02-19 DIAGNOSIS — N183 Chronic kidney disease, stage 3 (moderate): Secondary | ICD-10-CM | POA: Diagnosis not present

## 2017-02-19 DIAGNOSIS — Z125 Encounter for screening for malignant neoplasm of prostate: Secondary | ICD-10-CM | POA: Diagnosis not present

## 2017-02-19 DIAGNOSIS — E784 Other hyperlipidemia: Secondary | ICD-10-CM | POA: Diagnosis not present

## 2017-02-26 DIAGNOSIS — N183 Chronic kidney disease, stage 3 (moderate): Secondary | ICD-10-CM | POA: Diagnosis not present

## 2017-02-26 DIAGNOSIS — Z0000 Encounter for general adult medical examination without abnormal findings: Secondary | ICD-10-CM | POA: Diagnosis not present

## 2017-02-26 DIAGNOSIS — E784 Other hyperlipidemia: Secondary | ICD-10-CM | POA: Diagnosis not present

## 2017-02-26 DIAGNOSIS — I10 Essential (primary) hypertension: Secondary | ICD-10-CM | POA: Diagnosis not present

## 2017-02-26 DIAGNOSIS — E1129 Type 2 diabetes mellitus with other diabetic kidney complication: Secondary | ICD-10-CM | POA: Diagnosis not present

## 2017-02-26 DIAGNOSIS — M109 Gout, unspecified: Secondary | ICD-10-CM | POA: Diagnosis not present

## 2017-02-26 DIAGNOSIS — M545 Low back pain: Secondary | ICD-10-CM | POA: Diagnosis not present

## 2017-02-26 DIAGNOSIS — I480 Paroxysmal atrial fibrillation: Secondary | ICD-10-CM | POA: Diagnosis not present

## 2017-02-26 DIAGNOSIS — R808 Other proteinuria: Secondary | ICD-10-CM | POA: Diagnosis not present

## 2017-04-12 ENCOUNTER — Ambulatory Visit (INDEPENDENT_AMBULATORY_CARE_PROVIDER_SITE_OTHER): Payer: Medicare HMO | Admitting: Internal Medicine

## 2017-04-12 ENCOUNTER — Encounter: Payer: Self-pay | Admitting: Internal Medicine

## 2017-04-12 VITALS — BP 148/82 | HR 59 | Ht 68.0 in | Wt 280.4 lb

## 2017-04-12 DIAGNOSIS — Z7901 Long term (current) use of anticoagulants: Secondary | ICD-10-CM

## 2017-04-12 DIAGNOSIS — I480 Paroxysmal atrial fibrillation: Secondary | ICD-10-CM

## 2017-04-12 DIAGNOSIS — I10 Essential (primary) hypertension: Secondary | ICD-10-CM | POA: Diagnosis not present

## 2017-04-12 MED ORDER — DABIGATRAN ETEXILATE MESYLATE 150 MG PO CAPS: 150.0000 mg | ORAL_CAPSULE | Freq: Two times a day (BID) | ORAL | 11 refills | Status: DC

## 2017-04-12 MED ORDER — ATENOLOL 50 MG PO TABS: 50.0000 mg | ORAL_TABLET | Freq: Two times a day (BID) | ORAL | 3 refills | Status: DC

## 2017-04-12 MED ORDER — APIXABAN 5 MG PO TABS: 5.0000 mg | ORAL_TABLET | Freq: Two times a day (BID) | ORAL | 11 refills | Status: DC

## 2017-04-12 NOTE — Progress Notes (Signed)
HPI Mr. Deman returns today for followup. He is a pleasant 66 yo man with CAD, s/p PCI stent, who also has a h/o SVT and underwent ablation several years ago. He developed atrial fib and has also had atrial flutter which has been controlled with medical therapy. He has been mostly stable except for occaisional break throughs of palpitations, likely atrial fib. He denies medical noncompliance. He was prescribed coreg but prefers to take atenolol. He thinks he was out of rhythm for a few hours this past weekend. No other complaints. Finally, he has complained about the cost of Pradaxa.  Allergies  Allergen Reactions  . Niacin Itching     Current Outpatient Prescriptions  Medication Sig Dispense Refill  . amLODipine (NORVASC) 5 MG tablet Take 1 tablet (5 mg total) by mouth daily. 90 tablet 3  . aspirin 81 MG tablet Take 81 mg by mouth daily.      Marland Kitchen atenolol (TENORMIN) 50 MG tablet Take 1 tablet (50 mg total) by mouth 2 (two) times daily. 180 tablet 3  . atorvastatin (LIPITOR) 40 MG tablet Take 1 tablet (40 mg total) by mouth daily. 90 tablet 3  . dabigatran (PRADAXA) 150 MG CAPS capsule Take 1 capsule (150 mg total) by mouth 2 (two) times daily. 60 capsule 11  . esomeprazole (NEXIUM) 40 MG capsule Take 1 capsule (40 mg total) by mouth daily at 12 noon. 30 capsule 5  . fenofibrate micronized (LOFIBRA) 200 MG capsule Take 200 mg by mouth at bedtime.     . nitroGLYCERIN (NITROSTAT) 0.4 MG SL tablet Place 0.4 mg under the tongue every 5 (five) minutes x 3 doses as needed for chest pain.    . pioglitazone (ACTOS) 45 MG tablet Take 45 mg by mouth every Wednesday.     . quinapril (ACCUPRIL) 40 MG tablet Take 1 tablet (40 mg total) by mouth at bedtime. 90 tablet 3   No current facility-administered medications for this visit.      Past Medical History:  Diagnosis Date  . Arthritis   . Atrial fibrillation ([place])   . Atrial flutter ([place])   . Back pain   . CAD (coronary artery disease)     . DM (diabetes mellitus) ([place])   . Elevated PSA   . Gout   . Hearing problem   . HTN (hypertension)   . Hyperlipidemia   . Myocardial infarct, old 2010  . Prostatitis   . Renal insufficiency     ROS:   All systems reviewed and negative except as noted in the HPI.   Past Surgical History:  Procedure Laterality Date  . CARDIAC CATHETERIZATION  11/07/07   with 2 stents  . 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    Marland Kitchen EXTERNAL EAR SURGERY Right    cartilage  . LEFT AND RIGHT HEART CATHETERIZATION WITH CORONARY/GRAFT ANGIOGRAM N/A 09/27/2014   Procedure: LEFT AND RIGHT HEART CATHETERIZATION WITH Beatrix Fetters;  Surgeon: Sinclair Grooms, MD;  Location: [facility] CATH LAB;  Service: Cardiovascular;  Laterality: N/A;  . VASECTOMY       Family History  Problem Relation Age of Onset  . Hypertension Mother   . Gout Mother   . Alzheimer's disease Mother   . Heart failure Father   . Heart disease Brother   . Cancer Brother        type unknown     Social History   Social History  .  Marital status: Married    Spouse name: N/A  . Number of children: 2  . Years of education: N/A   Occupational History  . SERV TECH [employer]   Social History Main Topics  . Smoking status: Never Smoker  . Smokeless tobacco: Former Systems developer  . Alcohol use No  . Drug use: No  . Sexual activity: Not on file   Other Topics Concern  . Not on file   Social History Narrative  . No narrative on file     BP (!) 148/82   Pulse (!) 59   Ht 5\' 8"  (1.727 m)   Wt 280 lb 6.4 oz (127.2 kg)   SpO2 96%   BMI 42.63 kg/m   Physical Exam:  Well appearing but obese 66 yo man, NAD HEENT: Unremarkable Neck:  No JVD, no thyromegally Back:  No CVA tenderness Lungs:  Clear with no wheezes HEART:  Regular rate rhythm, no murmurs, no rubs, no clicks Abd:  soft, positive bowel sounds, no organomegally, no rebound, no guarding Ext:  2 plus pulses,  trace peripheral edema, no cyanosis, no clubbing Skin:  No rashes no nodules Neuro:  CN II through XII intact, motor grossly intact  ECG - nsr with Non-specific STT changes.   Assess/Plan: 1. PAF - he is maintaining NSR. No change in meds. 2. HTN - his blood pressure is a bit high but has been better at home. He is encouraged to maintain a low sodium diet and to lose weight. 3. CAD - he denies anginal symptoms. Will follow.  Mikle Bosworth.D.

## 2017-04-12 NOTE — Patient Instructions (Addendum)
Medication Instructions:  Your physician has recommended you make the following change in your medication:  FINISH Pradaxa STOP Aspirin START Eliquis 5 mg twice daily after finishing Pradaxa   Labwork: None Ordered   Testing/Procedures: None Ordered   Follow-Up: Your physician wants you to follow-up in: 1 year with Dr. Lovena Le. You will receive a reminder letter in the mail two months in advance. If you don't receive a letter, please call our office to schedule the follow-up appointment.     Any Other Special Instructions Will Be Listed Below (If Applicable).     If you need a refill on your cardiac medications before your next appointment, please call your pharmacy.

## 2017-04-13 ENCOUNTER — Telehealth: Payer: Self-pay | Admitting: *Deleted

## 2017-04-13 DIAGNOSIS — I480 Paroxysmal atrial fibrillation: Secondary | ICD-10-CM

## 2017-04-13 MED ORDER — DABIGATRAN ETEXILATE MESYLATE 150 MG PO CAPS: 150.0000 mg | ORAL_CAPSULE | Freq: Two times a day (BID) | ORAL | 11 refills | Status: DC

## 2017-04-13 NOTE — Telephone Encounter (Signed)
[id number] with patient as Joshua Hawk, LPN had requested that I place some eliquis samples at the front desk for the patient when we get them in. Patient stated that he would like to remain on pradaxa as the eliquis is more expensive for him even though it is on a lower tier through his insurance. He questioned why the nurse told him that the rx would only cost him $10 with the co-pay card, unfortunately he has medicare and cannot use a co-pay card. I have discussed this with him and he verbalized his understanding. He stated that he has at least one month of pradaxa on hand right now and he will call his pharmacy when he gets close to running out. Patient apologized for all of this. Routing as an Pharmacist, hospital. Thanks, MI

## 2017-04-13 NOTE — Telephone Encounter (Signed)
Call received from Pt requesting to stay on Pradaxa as Eliquis copay was more expensive.  OK per Dr. Lovena Le.  Changes made to Pt's chart.  Refills sent to Pt pharmacy.

## 2017-04-19 ENCOUNTER — Other Ambulatory Visit: Payer: Self-pay | Admitting: Internal Medicine

## 2017-04-19 DIAGNOSIS — I10 Essential (primary) hypertension: Secondary | ICD-10-CM

## 2017-04-19 DIAGNOSIS — I2510 Atherosclerotic heart disease of native coronary artery without angina pectoris: Secondary | ICD-10-CM

## 2017-04-25 ENCOUNTER — Other Ambulatory Visit: Payer: Self-pay | Admitting: Internal Medicine

## 2017-04-25 DIAGNOSIS — I480 Paroxysmal atrial fibrillation: Secondary | ICD-10-CM

## 2017-04-26 NOTE — Telephone Encounter (Signed)
Pt last saw Dr Lovena Le on 04/12/17. SCr 02/19/17 was 1.3, Wt 280lbs, age 66 yrs. CrCl 97.33mL/min, thus will refill Pradaxa 150mg  BID.

## 2017-05-10 ENCOUNTER — Other Ambulatory Visit: Payer: Self-pay | Admitting: Internal Medicine

## 2017-05-10 DIAGNOSIS — I10 Essential (primary) hypertension: Secondary | ICD-10-CM

## 2017-06-28 DIAGNOSIS — R69 Illness, unspecified: Secondary | ICD-10-CM | POA: Diagnosis not present

## 2017-08-31 DIAGNOSIS — M109 Gout, unspecified: Secondary | ICD-10-CM | POA: Diagnosis not present

## 2017-08-31 DIAGNOSIS — I10 Essential (primary) hypertension: Secondary | ICD-10-CM | POA: Diagnosis not present

## 2017-08-31 DIAGNOSIS — E7849 Other hyperlipidemia: Secondary | ICD-10-CM | POA: Diagnosis not present

## 2017-08-31 DIAGNOSIS — Z6841 Body Mass Index (BMI) 40.0 and over, adult: Secondary | ICD-10-CM | POA: Diagnosis not present

## 2017-08-31 DIAGNOSIS — I2510 Atherosclerotic heart disease of native coronary artery without angina pectoris: Secondary | ICD-10-CM | POA: Diagnosis not present

## 2017-08-31 DIAGNOSIS — I480 Paroxysmal atrial fibrillation: Secondary | ICD-10-CM | POA: Diagnosis not present

## 2017-08-31 DIAGNOSIS — M545 Low back pain: Secondary | ICD-10-CM | POA: Diagnosis not present

## 2017-08-31 DIAGNOSIS — E1129 Type 2 diabetes mellitus with other diabetic kidney complication: Secondary | ICD-10-CM | POA: Diagnosis not present

## 2017-08-31 DIAGNOSIS — I1310 Hypertensive heart and chronic kidney disease without heart failure, with stage 1 through stage 4 chronic kidney disease, or unspecified chronic kidney disease: Secondary | ICD-10-CM | POA: Diagnosis not present

## 2017-09-03 DIAGNOSIS — M5430 Sciatica, unspecified side: Secondary | ICD-10-CM | POA: Diagnosis not present

## 2017-09-03 DIAGNOSIS — M6240 Contracture of muscle, unspecified site: Secondary | ICD-10-CM | POA: Diagnosis not present

## 2017-09-03 DIAGNOSIS — M25561 Pain in right knee: Secondary | ICD-10-CM | POA: Diagnosis not present

## 2017-09-03 DIAGNOSIS — M791 Myalgia, unspecified site: Secondary | ICD-10-CM | POA: Diagnosis not present

## 2017-09-03 DIAGNOSIS — M9902 Segmental and somatic dysfunction of thoracic region: Secondary | ICD-10-CM | POA: Diagnosis not present

## 2017-09-03 DIAGNOSIS — M25562 Pain in left knee: Secondary | ICD-10-CM | POA: Diagnosis not present

## 2017-09-03 DIAGNOSIS — M9903 Segmental and somatic dysfunction of lumbar region: Secondary | ICD-10-CM | POA: Diagnosis not present

## 2017-09-03 DIAGNOSIS — Z72820 Sleep deprivation: Secondary | ICD-10-CM | POA: Diagnosis not present

## 2017-09-03 DIAGNOSIS — M9905 Segmental and somatic dysfunction of pelvic region: Secondary | ICD-10-CM | POA: Diagnosis not present

## 2017-09-08 DIAGNOSIS — M9902 Segmental and somatic dysfunction of thoracic region: Secondary | ICD-10-CM | POA: Diagnosis not present

## 2017-09-08 DIAGNOSIS — M6240 Contracture of muscle, unspecified site: Secondary | ICD-10-CM | POA: Diagnosis not present

## 2017-09-08 DIAGNOSIS — M9903 Segmental and somatic dysfunction of lumbar region: Secondary | ICD-10-CM | POA: Diagnosis not present

## 2017-09-08 DIAGNOSIS — M791 Myalgia, unspecified site: Secondary | ICD-10-CM | POA: Diagnosis not present

## 2017-09-08 DIAGNOSIS — M9905 Segmental and somatic dysfunction of pelvic region: Secondary | ICD-10-CM | POA: Diagnosis not present

## 2017-09-08 DIAGNOSIS — M5430 Sciatica, unspecified side: Secondary | ICD-10-CM | POA: Diagnosis not present

## 2017-09-08 DIAGNOSIS — Z72820 Sleep deprivation: Secondary | ICD-10-CM | POA: Diagnosis not present

## 2017-09-08 DIAGNOSIS — M25562 Pain in left knee: Secondary | ICD-10-CM | POA: Diagnosis not present

## 2017-09-08 DIAGNOSIS — M25561 Pain in right knee: Secondary | ICD-10-CM | POA: Diagnosis not present

## 2017-09-13 DIAGNOSIS — Z72820 Sleep deprivation: Secondary | ICD-10-CM | POA: Diagnosis not present

## 2017-09-13 DIAGNOSIS — M9905 Segmental and somatic dysfunction of pelvic region: Secondary | ICD-10-CM | POA: Diagnosis not present

## 2017-09-13 DIAGNOSIS — M6240 Contracture of muscle, unspecified site: Secondary | ICD-10-CM | POA: Diagnosis not present

## 2017-09-13 DIAGNOSIS — M25562 Pain in left knee: Secondary | ICD-10-CM | POA: Diagnosis not present

## 2017-09-13 DIAGNOSIS — M5430 Sciatica, unspecified side: Secondary | ICD-10-CM | POA: Diagnosis not present

## 2017-09-13 DIAGNOSIS — M791 Myalgia, unspecified site: Secondary | ICD-10-CM | POA: Diagnosis not present

## 2017-09-13 DIAGNOSIS — M9902 Segmental and somatic dysfunction of thoracic region: Secondary | ICD-10-CM | POA: Diagnosis not present

## 2017-09-13 DIAGNOSIS — M25561 Pain in right knee: Secondary | ICD-10-CM | POA: Diagnosis not present

## 2017-09-13 DIAGNOSIS — M9903 Segmental and somatic dysfunction of lumbar region: Secondary | ICD-10-CM | POA: Diagnosis not present

## 2017-11-05 DIAGNOSIS — R972 Elevated prostate specific antigen [PSA]: Secondary | ICD-10-CM | POA: Diagnosis not present

## 2017-11-12 DIAGNOSIS — R972 Elevated prostate specific antigen [PSA]: Secondary | ICD-10-CM | POA: Diagnosis not present

## 2017-11-12 DIAGNOSIS — R350 Frequency of micturition: Secondary | ICD-10-CM | POA: Diagnosis not present

## 2017-12-02 ENCOUNTER — Telehealth (INDEPENDENT_AMBULATORY_CARE_PROVIDER_SITE_OTHER): Payer: Self-pay | Admitting: Physical Medicine and Rehabilitation

## 2017-12-02 ENCOUNTER — Ambulatory Visit (INDEPENDENT_AMBULATORY_CARE_PROVIDER_SITE_OTHER): Payer: Medicare HMO | Admitting: Physical Medicine and Rehabilitation

## 2017-12-02 ENCOUNTER — Ambulatory Visit (INDEPENDENT_AMBULATORY_CARE_PROVIDER_SITE_OTHER): Payer: Medicare HMO

## 2017-12-02 ENCOUNTER — Encounter (INDEPENDENT_AMBULATORY_CARE_PROVIDER_SITE_OTHER): Payer: Self-pay | Admitting: Physical Medicine and Rehabilitation

## 2017-12-02 VITALS — BP 159/83 | HR 55 | Temp 97.7°F

## 2017-12-02 DIAGNOSIS — M5416 Radiculopathy, lumbar region: Secondary | ICD-10-CM

## 2017-12-02 DIAGNOSIS — G8929 Other chronic pain: Secondary | ICD-10-CM | POA: Diagnosis not present

## 2017-12-02 DIAGNOSIS — M25552 Pain in left hip: Secondary | ICD-10-CM

## 2017-12-02 DIAGNOSIS — M5442 Lumbago with sciatica, left side: Secondary | ICD-10-CM | POA: Diagnosis not present

## 2017-12-02 MED ORDER — DESIPRAMINE HCL 50 MG PO TABS: 50.0000 mg | ORAL_TABLET | Freq: Every day | ORAL | 0 refills | Status: DC

## 2017-12-02 MED ORDER — HYDROCODONE-ACETAMINOPHEN 5-325 MG PO TABS: 1.0000 | ORAL_TABLET | Freq: Three times a day (TID) | ORAL | 0 refills | Status: DC | PRN

## 2017-12-02 NOTE — Progress Notes (Signed)
.  Numeric Pain Rating Scale and Functional Assessment Average Pain 10 Pain Right Now 6 My pain is stabbing Pain is worse with: walking and standing Pain improves with: rest and heat/ice   In the last MONTH (on 0-10 scale) has pain interfered with the following?  1. General activity like being  able to carry out your everyday physical activities such as walking, climbing stairs, carrying groceries, or moving a chair?  Rating(8)  2. Relation with others like being able to carry out your usual social activities and roles such as  activities at home, at work and in your community. Rating(8)  3. Enjoyment of life such that you have  been bothered by emotional problems such as feeling anxious, depressed or irritable?  Rating(7)
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
## 2017-12-03 NOTE — Telephone Encounter (Signed)
Per Sherre Scarlet, RT :Patient needs to schedule an epidural steroid injection with Dr. Ernestina Patches at [facility]. He will need to hold Pradaxa for 5 days prior to the injection. Will this be ok? (Routing comment  Will route Meagan's comment to Dr [employer] office.

## 2017-12-03 NOTE — Telephone Encounter (Signed)
Will ask pharmacy to address.   Kerin Ransom PA-C 12/03/2017 3:19 PM

## 2017-12-03 NOTE — Telephone Encounter (Signed)
Pt takes Pradaxa for afib with CHADS2VASc score of 4 (age, HTN, CAD, DM). SCr is 1.3, CrCl 70mL/min. Recommend holding Pradaxa for only 3 days prior to injection as this will provide sufficient clearance of medication.

## 2017-12-06 NOTE — Telephone Encounter (Signed)
Received ok for patient to hold Plavix for 3 days only prior to injection.

## 2017-12-06 NOTE — Telephone Encounter (Signed)
3 to 5 days ok if we can

## 2017-12-08 NOTE — Telephone Encounter (Signed)
Holding Pradaxa for 3 days remains sufficient washout period for spinal procedure. If office will not do procedure with 3 day hold, ok to hold 4 days at most.

## 2017-12-09 NOTE — Telephone Encounter (Signed)
Will hold for 3 days. Patient scheduled.

## 2017-12-09 NOTE — Telephone Encounter (Signed)
Yes I should have just said 3 days ok, hope we get him some help

## 2017-12-09 NOTE — Telephone Encounter (Signed)
Clearance route to Dr.Newton and Sharyne Richters, RT via Epic.

## 2017-12-20 ENCOUNTER — Encounter (INDEPENDENT_AMBULATORY_CARE_PROVIDER_SITE_OTHER): Payer: Self-pay | Admitting: Physical Medicine and Rehabilitation

## 2017-12-20 ENCOUNTER — Ambulatory Visit (INDEPENDENT_AMBULATORY_CARE_PROVIDER_SITE_OTHER): Payer: Medicare HMO | Admitting: Physical Medicine and Rehabilitation

## 2017-12-20 ENCOUNTER — Ambulatory Visit (INDEPENDENT_AMBULATORY_CARE_PROVIDER_SITE_OTHER): Payer: Self-pay

## 2017-12-20 VITALS — BP 172/85 | HR 56 | Temp 97.8°F

## 2017-12-20 DIAGNOSIS — M5416 Radiculopathy, lumbar region: Secondary | ICD-10-CM | POA: Diagnosis not present

## 2017-12-20 MED ORDER — METHYLPREDNISOLONE ACETATE 80 MG/ML IJ SUSP
80.0000 mg | Freq: Once | INTRAMUSCULAR | Status: AC
  Administered 2017-12-20: 80 mg

## 2017-12-20 NOTE — Progress Notes (Signed)
   Numeric Pain Rating Scale and Functional Assessment Average Pain 10   In the last MONTH (on 0-10 scale) has pain interfered with the following?  1. General activity like being  able to carry out your everyday physical activities such as walking, climbing stairs, carrying groceries, or moving a chair?  Rating(10)   +Driver, -BT(stopped pradaxa 12/16/17), -Dye Allergies.

## 2017-12-20 NOTE — Patient Instructions (Signed)

## 2017-12-21 NOTE — Procedures (Signed)
Lumbar Epidural Steroid Injection - Interlaminar Approach with Fluoroscopic Guidance  Patient: Joshua [name]      Date of Birth: 1951-02-11 MRN: [id number] PCP: Leanna Battles, MD      Visit Date: 12/20/2017   Universal Protocol:     Consent Given By: the patient  Position: PRONE  Additional Comments: Vital signs were monitored before and after the procedure. Patient was prepped and draped in the usual sterile fashion. The correct patient, procedure, and site was verified.   Injection Procedure Details:  Procedure Site One Meds Administered:  Meds ordered this encounter  Medications  . methylPREDNISolone acetate (DEPO-MEDROL) injection 80 mg     Laterality: Left  Location/Site:  L5-S1  Needle size: 20 G  Needle type: Tuohy  Needle Placement: Paramedian epidural  Findings:   -Comments: Excellent flow of contrast into the epidural space.  Procedure Details: Using a paramedian approach from the side mentioned above, the region overlying the inferior lamina was localized under fluoroscopic visualization and the soft tissues overlying this structure were infiltrated with 4 ml. of 1% Lidocaine without Epinephrine. The Tuohy needle was inserted into the epidural space using a paramedian approach.   The epidural space was localized using loss of resistance along with lateral and bi-planar fluoroscopic views.  After negative aspirate for air, blood, and CSF, a 2 ml. volume of Isovue-250 was injected into the epidural space and the flow of contrast was observed. Radiographs were obtained for documentation purposes.    The injectate was administered into the level noted above.   Additional Comments:  The patient tolerated the procedure well Dressing: Band-Aid    Post-procedure details: Patient was observed during the procedure. Post-procedure instructions were reviewed.  Patient left the clinic in stable condition.

## 2017-12-21 NOTE — Progress Notes (Signed)
Joshua [name] - 67 y.o. male MRN [id number]  Date of birth: 1951/05/17  Office Visit Note: Visit Date: 12/20/2017 PCP: Leanna Battles, MD Referred by: Leanna Battles, MD  Subjective: Chief Complaint  Patient presents with  . Lower Back - Pain  . Left Leg - Pain  . Right Hip - Pain   HPI: Joshua [name] is a 67 year old gentleman that comes in today for a left L5-S1 intralaminar epidural steroid injection which is an empiric injection for what is clearly a radiculitis radiculopathy at least on last visit.  He has significant health issues with cardiovascular disease and diabetes.  We do have him off his blood thinner which she will return to taking tomorrow morning.  He reports that the desipramine that we tried at night broke him out in a rash so he quit taking that.  He reports that hydrocodone has not helped at all.  He reports to me today that something must be done.  He has not worked since we saw him.  He has been on FMLA.  We are going to complete the injection today and have him come back and see [place] in about 10 days.  At that point we can readdress work restrictions versus MRI.   ROS Otherwise per HPI.  Assessment & Plan: Visit Diagnoses:  1. Lumbar radiculopathy     Plan: No additional findings.   Meds & Orders:  Meds ordered this encounter  Medications  . methylPREDNISolone acetate (DEPO-MEDROL) injection 80 mg    Orders Placed This Encounter  Procedures  . XR C-ARM NO REPORT  . Epidural Steroid injection    Follow-up: Return in about 2 weeks (around 01/03/2018).   Procedures: No procedures performed  Lumbar Epidural Steroid Injection - Interlaminar Approach with Fluoroscopic Guidance  Patient: Joshua [name]      Date of Birth: 21-Mar-1951 MRN: [id number] PCP: Leanna Battles, MD      Visit Date: 12/20/2017   Universal Protocol:     Consent Given By: the patient  Position: PRONE  Additional Comments: Vital signs were monitored before and after the  procedure. Patient was prepped and draped in the usual sterile fashion. The correct patient, procedure, and site was verified.   Injection Procedure Details:  Procedure Site One Meds Administered:  Meds ordered this encounter  Medications  . methylPREDNISolone acetate (DEPO-MEDROL) injection 80 mg     Laterality: Left  Location/Site:  L5-S1  Needle size: 20 G  Needle type: Tuohy  Needle Placement: Paramedian epidural  Findings:   -Comments: Excellent flow of contrast into the epidural space.  Procedure Details: Using a paramedian approach from the side mentioned above, the region overlying the inferior lamina was localized under fluoroscopic visualization and the soft tissues overlying this structure were infiltrated with 4 ml. of 1% Lidocaine without Epinephrine. The Tuohy needle was inserted into the epidural space using a paramedian approach.   The epidural space was localized using loss of resistance along with lateral and bi-planar fluoroscopic views.  After negative aspirate for air, blood, and CSF, a 2 ml. volume of Isovue-250 was injected into the epidural space and the flow of contrast was observed. Radiographs were obtained for documentation purposes.    The injectate was administered into the level noted above.   Additional Comments:  The patient tolerated the procedure well Dressing: Band-Aid    Post-procedure details: Patient was observed during the procedure. Post-procedure instructions were reviewed.  Patient left the clinic in stable condition.    Clinical  History: No specialty comments available.   He reports that he has never smoked. He has quit using smokeless tobacco. No results for input(s): HGBA1C, LABURIC in the last 8760 hours.  Objective:  VS:  HT:    WT:   BMI:     BP:(!) 172/85  HR:(!) 56bpm  TEMP:97.8 F (36.6 C)(Oral)  RESP:97 % Physical Exam  Musculoskeletal:  Obese male who ambulates without aid.  He is slow to rise from a  seated position and does seem to have somewhat of an antalgic gait.  He has good distal strength.    Ortho Exam Imaging: Xr C-arm No Report  Result Date: 12/20/2017 Please see Notes or Procedures tab for imaging impression.   Past Medical/Family/Surgical/Social History: Medications & Allergies reviewed per EMR, new medications updated. Patient Active Problem List   Diagnosis Date Noted  . Chest pain, unspecified 06/23/2016  . Abnormal nuclear stress test 09/27/2014  . Crescendo angina ([place]) 09/26/2014  . Pulmonary hypertension ([place]) 09/26/2014  . Internal hemorrhoids 01/19/2014  . Diverticulosis of colon without hemorrhage 01/19/2014  . Paroxysmal atrial fibrillation ([place]) 04/29/2010  . PALPITATIONS 04/21/2010  . HEMORRHOIDS-EXTERNAL 11/01/2009  . HYPERKALEMIA 09/09/2009  . DM 08/16/2009  . HYPERLIPIDEMIA 08/16/2009  . Essential hypertension 08/16/2009  . Coronary artery disease due to lipid rich plaque 08/16/2009  . RENAL INSUFFICIENCY 08/16/2009   Past Medical History:  Diagnosis Date  . Arthritis   . Atrial fibrillation ([place])   . Atrial flutter ([place])   . Back pain   . CAD (coronary artery disease)   . DM (diabetes mellitus) ([place])   . Elevated PSA   . Gout   . Hearing problem   . HTN (hypertension)   . Hyperlipidemia   . Myocardial infarct, old 2010  . Prostatitis   . Renal insufficiency    Family History  Problem Relation Age of Onset  . Hypertension Mother   . Gout Mother   . Alzheimer's disease Mother   . Heart failure Father   . Heart disease Brother   . Cancer Brother        type unknown   Past Surgical History:  Procedure Laterality Date  . CARDIAC CATHETERIZATION  11/07/07   with 2 stents  . 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    Marland Kitchen EXTERNAL EAR SURGERY Right    cartilage  . LEFT AND RIGHT HEART CATHETERIZATION WITH CORONARY/GRAFT ANGIOGRAM N/A 09/27/2014   Procedure: LEFT AND RIGHT HEART  CATHETERIZATION WITH Beatrix Fetters;  Surgeon: Sinclair Grooms, MD;  Location: [facility] CATH LAB;  Service: Cardiovascular;  Laterality: N/A;  . VASECTOMY     Social History   Occupational History  . Occupation: SERV TECH    Employer: [employer]  Tobacco Use  . Smoking status: Never Smoker  . Smokeless tobacco: Former Network engineer and Sexual Activity  . Alcohol use: No  . Drug use: No  . Sexual activity: Not on file

## 2017-12-28 ENCOUNTER — Encounter (INDEPENDENT_AMBULATORY_CARE_PROVIDER_SITE_OTHER): Payer: Self-pay | Admitting: Physical Medicine and Rehabilitation

## 2017-12-29 ENCOUNTER — Other Ambulatory Visit (INDEPENDENT_AMBULATORY_CARE_PROVIDER_SITE_OTHER): Payer: Self-pay | Admitting: Physical Medicine and Rehabilitation

## 2017-12-29 NOTE — Telephone Encounter (Signed)
Please advise 

## 2017-12-29 NOTE — Telephone Encounter (Signed)
He told me he thought he had itching rash from this, can you call him to see if he wants refill

## 2017-12-31 DIAGNOSIS — I480 Paroxysmal atrial fibrillation: Secondary | ICD-10-CM | POA: Diagnosis not present

## 2017-12-31 DIAGNOSIS — E1129 Type 2 diabetes mellitus with other diabetic kidney complication: Secondary | ICD-10-CM | POA: Diagnosis not present

## 2017-12-31 DIAGNOSIS — I1310 Hypertensive heart and chronic kidney disease without heart failure, with stage 1 through stage 4 chronic kidney disease, or unspecified chronic kidney disease: Secondary | ICD-10-CM | POA: Diagnosis not present

## 2017-12-31 DIAGNOSIS — M545 Low back pain: Secondary | ICD-10-CM | POA: Diagnosis not present

## 2017-12-31 DIAGNOSIS — I2510 Atherosclerotic heart disease of native coronary artery without angina pectoris: Secondary | ICD-10-CM | POA: Diagnosis not present

## 2017-12-31 DIAGNOSIS — Z6841 Body Mass Index (BMI) 40.0 and over, adult: Secondary | ICD-10-CM | POA: Diagnosis not present

## 2017-12-31 DIAGNOSIS — I10 Essential (primary) hypertension: Secondary | ICD-10-CM | POA: Diagnosis not present

## 2017-12-31 DIAGNOSIS — Z1389 Encounter for screening for other disorder: Secondary | ICD-10-CM | POA: Diagnosis not present

## 2018-01-05 ENCOUNTER — Ambulatory Visit (INDEPENDENT_AMBULATORY_CARE_PROVIDER_SITE_OTHER): Payer: Medicare HMO | Admitting: Physical Medicine and Rehabilitation

## 2018-01-05 ENCOUNTER — Other Ambulatory Visit (INDEPENDENT_AMBULATORY_CARE_PROVIDER_SITE_OTHER): Payer: Self-pay | Admitting: Physical Medicine and Rehabilitation

## 2018-01-05 ENCOUNTER — Ambulatory Visit (INDEPENDENT_AMBULATORY_CARE_PROVIDER_SITE_OTHER): Payer: Medicare HMO

## 2018-01-05 ENCOUNTER — Encounter (INDEPENDENT_AMBULATORY_CARE_PROVIDER_SITE_OTHER): Payer: Self-pay | Admitting: Physical Medicine and Rehabilitation

## 2018-01-05 ENCOUNTER — Telehealth (INDEPENDENT_AMBULATORY_CARE_PROVIDER_SITE_OTHER): Payer: Self-pay | Admitting: *Deleted

## 2018-01-05 VITALS — BP 162/80 | HR 56 | Temp 97.7°F

## 2018-01-05 DIAGNOSIS — M5442 Lumbago with sciatica, left side: Secondary | ICD-10-CM | POA: Diagnosis not present

## 2018-01-05 DIAGNOSIS — M25552 Pain in left hip: Secondary | ICD-10-CM | POA: Diagnosis not present

## 2018-01-05 DIAGNOSIS — G8929 Other chronic pain: Secondary | ICD-10-CM | POA: Diagnosis not present

## 2018-01-05 DIAGNOSIS — M5416 Radiculopathy, lumbar region: Secondary | ICD-10-CM | POA: Diagnosis not present

## 2018-01-05 DIAGNOSIS — Z1389 Encounter for screening for other disorder: Secondary | ICD-10-CM

## 2018-01-05 NOTE — Telephone Encounter (Signed)
Order entered. Awaiting cosign.

## 2018-01-05 NOTE — Progress Notes (Signed)
.  Numeric Pain Rating Scale and Functional Assessment Average Pain 7 Pain Right Now 4 My pain is constant, sharp and stabbing Pain is worse with: walking, bending and standing Pain improves with: rest and medication   In the last MONTH (on 0-10 scale) has pain interfered with the following?  1. General activity like being  able to carry out your everyday physical activities such as walking, climbing stairs, carrying groceries, or moving a chair?  Rating(8)  2. Relation with others like being able to carry out your usual social activities and roles such as  activities at home, at work and in your community. Rating(3)  3. Enjoyment of life such that you have  been bothered by emotional problems such as feeling anxious, depressed or irritable?  Rating(1)

## 2018-01-05 NOTE — Telephone Encounter (Signed)
Joshua [name] is needing an order for xray placed for a 2 view xr of skull d/t foreign object that is preventing them to proceed with MRI he had something done 30 years ago and there is no information about it so for safety they would like to do an xray. Please place order for xr skull of [id number] once ok'd.   Please advise.

## 2018-01-13 ENCOUNTER — Encounter (INDEPENDENT_AMBULATORY_CARE_PROVIDER_SITE_OTHER): Payer: Self-pay | Admitting: Physical Medicine and Rehabilitation

## 2018-01-13 NOTE — Progress Notes (Signed)
Joshua [name] - 67 y.o. male MRN [id number]  Date of birth: Jan 22, 1951  Office Visit Note: Visit Date: 01/05/2018 PCP: Leanna Battles, MD Referred by: Leanna Battles, MD  Subjective: Chief Complaint  Patient presents with  . Left Hip - Pain  . Left Leg - Numbness   HPI: Joshua [name] is a 67 year old gentleman with chronic worsening low back left hip and leg pain.  We completed epidural injection at L5-S1 at the only gave him about 25% relief which is not very much.  He reports activity and walking makes it worse that he feels like he is really unable to work.  He does get some pain in the left groin.  This is the first time he is really mentioned any groin pain.  We did obtain images of his pelvis and left hip today and those are reviewed below but do show medial joint line arthritis.  He reports that Tylenol helps with his pain as well as sitting.  He is currently on FMLA and has not returned to work yet.  He does not endorse any focal weakness but he also has numbness down the leg.  He has not had any right-sided complaints.  He has not endorse specific injury.  Our prior notes can be reviewed for further evaluation.  He said no red flag symptoms of fevers chills or night sweats.  MRI of the lumbar spine was not initially obtained because we felt like diagnostically we can get him some relief with epidural injection.  His case is complicated by cardiovascular issues and he is on anticoagulation.   Review of Systems  Constitutional: Negative for chills, fever, malaise/fatigue and weight loss.  HENT: Negative for hearing loss and sinus pain.   Eyes: Negative for blurred vision, double vision and photophobia.  Respiratory: Negative for cough and shortness of breath.   Cardiovascular: Negative for chest pain, palpitations and leg swelling.  Gastrointestinal: Negative for abdominal pain, nausea and vomiting.  Genitourinary: Negative for flank pain.  Musculoskeletal: Positive for back pain  and joint pain. Negative for myalgias.  Skin: Negative for itching and rash.  Neurological: Positive for tingling. Negative for tremors, focal weakness and weakness.  Endo/Heme/Allergies: Negative.   Psychiatric/Behavioral: Negative for depression.  All other systems reviewed and are negative.  Otherwise per HPI.  Assessment & Plan: Visit Diagnoses:  1. Pain in left hip   2. Lumbar radiculopathy   3. Chronic left-sided low back pain with left-sided sciatica     Plan: Findings:  Mixed possible pain generator with likely disc herniation or protrusion or possibly stenosis of the lumbar spine.  He does get pain in the low back left hip and leg with paresthesia.  He got a very mild bit of relief with general epidural injection.  I think the next step is lumbar spine MRI to see if there is anything at a different location that we could adjust injection treatment to.  This would also look to see if this is a surgical issue.  He could also be having some symptoms of left hip osteoarthritis.  Exam shows stiffness of the left hip but does not really reproduce all of his pain.  At this point I think the best approach is to continue with current medications and get the MRI of the lumbar spine as quick as we can.  He would prefer an open MRI as he does have some anxiety issues in the past with the MRI.  I will see him back after  the MRI for further plan.    Meds & Orders: No orders of the defined types were placed in this encounter.   Orders Placed This Encounter  Procedures  . XR HIP UNILAT W OR W/O PELVIS 1V LEFT  . MR LUMBAR SPINE WO CONTRAST    Follow-up: Return for Recheck hip, Recheck spine, MRI review after completion.   Procedures: No procedures performed  No notes on file   Clinical History: No specialty comments available.   He reports that he has never smoked. He has quit using smokeless tobacco. No results for input(s): HGBA1C, LABURIC in the last 8760 hours.  Objective:  VS:  HT:     WT:   BMI:     BP:(!) 162/80  HR:(!) 56bpm  TEMP:97.7 F (36.5 C)(Oral)  RESP:97 % Physical Exam  Constitutional: He is oriented to person, place, and time. He appears well-developed and well-nourished. No distress.  Obese  HENT:  Head: Normocephalic and atraumatic.  Eyes: Pupils are equal, round, and reactive to light. Conjunctivae are normal.  Neck: Normal range of motion. Neck supple.  Cardiovascular: Regular rhythm and intact distal pulses.  Pulmonary/Chest: Effort normal. No respiratory distress.  Musculoskeletal:  Patient ambulates with an antalgic gait to the left.  There is no Trendelenburg sign or gait.  Rotation of both hips shows that the left hip has less range of motion on the right and is stiff.  He does not reproduce all of his pain however with rotation.  He has no pain over the greater trochanters.  He does have low back pain with extension rotation and facet joint loading.  This is also not concordant for his left hip and leg.  He has a negative slump test bilaterally.  He has good distal strength.  Neurological: He is alert and oriented to person, place, and time. He exhibits normal muscle tone. Coordination normal.  Skin: Skin is warm and dry. No rash noted. No erythema.  Psychiatric: He has a normal mood and affect.  Nursing note and vitals reviewed.   Ortho Exam Imaging: No results found.  Past Medical/Family/Surgical/Social History: Medications & Allergies reviewed per EMR, new medications updated. Patient Active Problem List   Diagnosis Date Noted  . Chest pain, unspecified 06/23/2016  . Abnormal nuclear stress test 09/27/2014  . Crescendo angina ([place]) 09/26/2014  . Pulmonary hypertension ([place]) 09/26/2014  . Internal hemorrhoids 01/19/2014  . Diverticulosis of colon without hemorrhage 01/19/2014  . Paroxysmal atrial fibrillation ([place]) 04/29/2010  . PALPITATIONS 04/21/2010  . HEMORRHOIDS-EXTERNAL 11/01/2009  . HYPERKALEMIA 09/09/2009  . DM 08/16/2009   . HYPERLIPIDEMIA 08/16/2009  . Essential hypertension 08/16/2009  . Coronary artery disease due to lipid rich plaque 08/16/2009  . RENAL INSUFFICIENCY 08/16/2009   Past Medical History:  Diagnosis Date  . Arthritis   . Atrial fibrillation ([place])   . Atrial flutter ([place])   . Back pain   . CAD (coronary artery disease)   . DM (diabetes mellitus) ([place])   . Elevated PSA   . Gout   . Hearing problem   . HTN (hypertension)   . Hyperlipidemia   . Myocardial infarct, old 2010  . Prostatitis   . Renal insufficiency    Family History  Problem Relation Age of Onset  . Hypertension Mother   . Gout Mother   . Alzheimer's disease Mother   . Heart failure Father   . Heart disease Brother   . Cancer Brother        type unknown  Past Surgical History:  Procedure Laterality Date  . CARDIAC CATHETERIZATION  11/07/07   with 2 stents  . 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    Marland Kitchen EXTERNAL EAR SURGERY Right    cartilage  . LEFT AND RIGHT HEART CATHETERIZATION WITH CORONARY/GRAFT ANGIOGRAM N/A 09/27/2014   Procedure: LEFT AND RIGHT HEART CATHETERIZATION WITH Beatrix Fetters;  Surgeon: Sinclair Grooms, MD;  Location: [facility] CATH LAB;  Service: Cardiovascular;  Laterality: N/A;  . VASECTOMY     Social History   Occupational History  . Occupation: SERV TECH    Employer: [employer]  Tobacco Use  . Smoking status: Never Smoker  . Smokeless tobacco: Former Network engineer and Sexual Activity  . Alcohol use: No  . Drug use: No  . Sexual activity: Not on file

## 2018-01-14 ENCOUNTER — Ambulatory Visit
Admission: RE | Admit: 2018-01-14 | Discharge: 2018-01-14 | Disposition: A | Discharge status: Home / Self Care | Payer: Medicare HMO | Source: Ambulatory Visit | Attending: Physical Medicine and Rehabilitation | Admitting: Physical Medicine and Rehabilitation

## 2018-01-14 DIAGNOSIS — Z9629 Presence of other otological and audiological implants: Secondary | ICD-10-CM | POA: Diagnosis not present

## 2018-01-14 DIAGNOSIS — Z1389 Encounter for screening for other disorder: Secondary | ICD-10-CM

## 2018-01-14 DIAGNOSIS — G8929 Other chronic pain: Secondary | ICD-10-CM

## 2018-01-14 DIAGNOSIS — M5416 Radiculopathy, lumbar region: Secondary | ICD-10-CM

## 2018-01-14 DIAGNOSIS — M5442 Lumbago with sciatica, left side: Secondary | ICD-10-CM

## 2018-01-14 DIAGNOSIS — Z0189 Encounter for other specified special examinations: Secondary | ICD-10-CM

## 2018-01-17 ENCOUNTER — Encounter (INDEPENDENT_AMBULATORY_CARE_PROVIDER_SITE_OTHER): Payer: Self-pay | Admitting: Radiology

## 2018-01-17 DIAGNOSIS — H9190 Unspecified hearing loss, unspecified ear: Secondary | ICD-10-CM | POA: Insufficient documentation

## 2018-01-19 ENCOUNTER — Other Ambulatory Visit (INDEPENDENT_AMBULATORY_CARE_PROVIDER_SITE_OTHER): Payer: Self-pay | Admitting: Physical Medicine and Rehabilitation

## 2018-01-19 DIAGNOSIS — M5416 Radiculopathy, lumbar region: Secondary | ICD-10-CM

## 2018-01-24 ENCOUNTER — Telehealth: Payer: Self-pay | Admitting: *Deleted

## 2018-01-24 NOTE — Telephone Encounter (Signed)
   [facility] Pre-operative Risk Assessment    Request for surgical clearance:  1. What type of surgery is being performed? Myelogram   2. When is this surgery scheduled? TBD   3. What type of clearance is required (medical clearance vs. Pharmacy clearance to hold med vs. Both)? Pharmacy  4. Are there any medications that need to be held prior to surgery and how long? Pradaxa--48 hrs   5. Practice name and name of physician performing surgery? [facility]   6. What is your office phone number [phone]    7.   What is your office fax number [phone]  8.   Anesthesia type (None, local, MAC, general) ? Not listed.   Rodman Key 01/24/2018, 5:08 PM  _________________________________________________________________   (provider comments below)

## 2018-01-24 NOTE — Progress Notes (Signed)
Phone call to patient to verify medication list and allergies for myelogram procedure. Pt informed he will need to stop Pradaxa 48hrs prior to Myelogram procedure pending cardiologist (Dr. Crissie Sickles) approval. Pt verbalized understanding. Fax sent requesting blood thinner hold order. Awaiting reply.

## 2018-01-25 NOTE — Telephone Encounter (Signed)
Routed to pharmacy

## 2018-01-25 NOTE — Telephone Encounter (Signed)
Patient with diagnosis of Afib on Pradaxa for anticoagulation.    Procedure: myelogram Date of procedure: TBD  CHADS2-VASc score of  4 (CHF, HTN, AGE, DM2, stroke/tia x 2, CAD, AGE, male)  CrCl 23ml/min  Per office protocol, patient can hold Pradaxa for 3 days prior to procedure.

## 2018-01-25 NOTE — Telephone Encounter (Signed)
   Primary Cardiologist:Gregg Lovena Le, MD  Notes indicate communication regarding anticoagulation was faxed to requesting surgeon.  Pre-op covering staff: - Please call requesting surgeon to make sure fax with anticoagulation recommendations was received.  Richardson Dopp, PA-C  01/25/2018, 4:11 PM

## 2018-01-25 NOTE — Telephone Encounter (Signed)
Faxed back to number provided via Epic

## 2018-01-26 ENCOUNTER — Telehealth (INDEPENDENT_AMBULATORY_CARE_PROVIDER_SITE_OTHER): Payer: Self-pay

## 2018-01-26 NOTE — Telephone Encounter (Signed)
Can you call the patient and ask about this?

## 2018-01-26 NOTE — Telephone Encounter (Signed)
I received a voicemail from pts employer stating she was calling on pts behalf and requesting to speak with someone regarding a work note for this patient. 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 This lady is not listed on his HIPAA so unable to speak with her. Can you call pt instead and see what he is needing.

## 2018-01-26 NOTE — Telephone Encounter (Signed)
I can have note say out until results from myelogram are known

## 2018-01-26 NOTE — Telephone Encounter (Signed)
Patient's work called stating that they need a note saying that patient will be out of work indefinitely. Ok to write note? It does not look like myelogram has been scheduled yet.

## 2018-01-26 NOTE — Telephone Encounter (Signed)
Note printed. I will fax to patient's employer.

## 2018-02-07 ENCOUNTER — Ambulatory Visit
Admission: RE | Admit: 2018-02-07 | Discharge: 2018-02-07 | Disposition: A | Discharge status: Home / Self Care | Payer: Medicare HMO | Source: Ambulatory Visit | Attending: Physical Medicine and Rehabilitation | Admitting: Physical Medicine and Rehabilitation

## 2018-02-07 ENCOUNTER — Encounter (INDEPENDENT_AMBULATORY_CARE_PROVIDER_SITE_OTHER): Payer: Self-pay | Admitting: *Deleted

## 2018-02-07 DIAGNOSIS — M48061 Spinal stenosis, lumbar region without neurogenic claudication: Secondary | ICD-10-CM | POA: Diagnosis not present

## 2018-02-07 DIAGNOSIS — M5416 Radiculopathy, lumbar region: Secondary | ICD-10-CM

## 2018-02-07 MED ORDER — MEPERIDINE HCL 100 MG/ML IJ SOLN
75.0000 mg | Freq: Once | INTRAMUSCULAR | Status: AC
  Administered 2018-02-07: 75 mg via INTRAMUSCULAR

## 2018-02-07 MED ORDER — ONDANSETRON HCL 4 MG/2ML IJ SOLN
4.0000 mg | Freq: Once | INTRAMUSCULAR | Status: AC
  Administered 2018-02-07: 4 mg via INTRAMUSCULAR

## 2018-02-07 MED ORDER — IOPAMIDOL (ISOVUE-M 200) INJECTION 41%
15.0000 mL | Freq: Once | INTRAMUSCULAR | Status: AC
  Administered 2018-02-07: 15 mL via INTRATHECAL

## 2018-02-07 MED ORDER — DIAZEPAM 5 MG PO TABS
5.0000 mg | ORAL_TABLET | Freq: Once | ORAL | Status: AC
  Administered 2018-02-07: 5 mg via ORAL

## 2018-02-07 NOTE — Discharge Instructions (Signed)

## 2018-02-09 ENCOUNTER — Encounter (INDEPENDENT_AMBULATORY_CARE_PROVIDER_SITE_OTHER): Payer: Self-pay | Admitting: Radiology

## 2018-02-25 DIAGNOSIS — E7849 Other hyperlipidemia: Secondary | ICD-10-CM | POA: Diagnosis not present

## 2018-02-25 DIAGNOSIS — Z125 Encounter for screening for malignant neoplasm of prostate: Secondary | ICD-10-CM | POA: Diagnosis not present

## 2018-02-25 DIAGNOSIS — E1129 Type 2 diabetes mellitus with other diabetic kidney complication: Secondary | ICD-10-CM | POA: Diagnosis not present

## 2018-02-25 DIAGNOSIS — M109 Gout, unspecified: Secondary | ICD-10-CM | POA: Diagnosis not present

## 2018-02-25 DIAGNOSIS — N183 Chronic kidney disease, stage 3 (moderate): Secondary | ICD-10-CM | POA: Diagnosis not present

## 2018-02-25 DIAGNOSIS — R82998 Other abnormal findings in urine: Secondary | ICD-10-CM | POA: Diagnosis not present

## 2018-02-28 ENCOUNTER — Encounter (INDEPENDENT_AMBULATORY_CARE_PROVIDER_SITE_OTHER): Payer: Self-pay | Admitting: Physical Medicine and Rehabilitation

## 2018-02-28 ENCOUNTER — Encounter

## 2018-02-28 ENCOUNTER — Ambulatory Visit (INDEPENDENT_AMBULATORY_CARE_PROVIDER_SITE_OTHER): Payer: Medicare HMO

## 2018-02-28 ENCOUNTER — Ambulatory Visit (INDEPENDENT_AMBULATORY_CARE_PROVIDER_SITE_OTHER): Payer: Medicare HMO | Admitting: Physical Medicine and Rehabilitation

## 2018-02-28 VITALS — BP 157/77 | HR 54

## 2018-02-28 DIAGNOSIS — M5416 Radiculopathy, lumbar region: Secondary | ICD-10-CM

## 2018-02-28 DIAGNOSIS — M48062 Spinal stenosis, lumbar region with neurogenic claudication: Secondary | ICD-10-CM | POA: Diagnosis not present

## 2018-02-28 MED ORDER — BETAMETHASONE SOD PHOS & ACET 6 (3-3) MG/ML IJ SUSP
12.0000 mg | Freq: Once | INTRAMUSCULAR | Status: AC
  Administered 2018-02-28: 12 mg

## 2018-02-28 NOTE — Patient Instructions (Signed)

## 2018-02-28 NOTE — Progress Notes (Signed)
 +  Driver, -BT, -Dye Allergies.  .Numeric Pain Rating Scale and Functional Assessment Average Pain 8 Pain Right Now 6 My pain is constant and sharp Pain is worse with: walking, standing and some activites Pain improves with: rest   In the last MONTH (on 0-10 scale) has pain interfered with the following?  1. General activity like being  able to carry out your everyday physical activities such as walking, climbing stairs, carrying groceries, or moving a chair?  Rating(7)  2. Relation with others like being able to carry out your usual social activities and roles such as  activities at home, at work and in your community. Rating(5)  3. Enjoyment of life such that you have  been bothered by emotional problems such as feeling anxious, depressed or irritable?  Rating(0)

## 2018-03-01 NOTE — Procedures (Signed)
Lumbosacral Transforaminal Epidural Steroid Injection - Sub-Pedicular Approach with Fluoroscopic Guidance  Patient: Joshua [name]      Date of Birth: [birth year] MRN: [id number] PCP: Leanna Battles, MD      Visit Date: 02/28/2018   Universal Protocol:    Date/Time: 02/28/2018  Consent Given By: the patient  Position: PRONE  Additional Comments: Vital signs were monitored before and after the procedure. Patient was prepped and draped in the usual sterile fashion. The correct patient, procedure, and site was verified.   Injection Procedure Details:  Procedure Site One Meds Administered:  Meds ordered this encounter  Medications  . betamethasone acetate-betamethasone sodium phosphate (CELESTONE) injection 12 mg    Laterality: Left  Location/Site:  L4-L5  Needle size: 22 G  Needle type: Spinal  Needle Placement: Transforaminal  Findings:    -Comments: Excellent flow of contrast along the nerve and into the epidural space.  Procedure Details: After squaring off the end-plates to get a true AP view, the C-arm was positioned so that an oblique view of the foramen as noted above was visualized. The target area is just inferior to the "nose of the scotty dog" or sub pedicular. The soft tissues overlying this structure were infiltrated with 2-3 ml. of 1% Lidocaine without Epinephrine.  The spinal needle was inserted toward the target using a "trajectory" view along the fluoroscope beam.  Under AP and lateral visualization, the needle was advanced so it did not puncture dura and was located close the 6 O'Clock position of the pedical in AP tracterory. Biplanar projections were used to confirm position. Aspiration was confirmed to be negative for CSF and/or blood. A 1-2 ml. volume of Isovue-250 was injected and flow of contrast was noted at each level. Radiographs were obtained for documentation purposes.   After attaining the desired flow of contrast documented above, a 0.5 to  1.0 ml test dose of 0.25% Marcaine was injected into each respective transforaminal space.  The patient was observed for 90 seconds post injection.  After no sensory deficits were reported, and normal lower extremity motor function was noted,   the above injectate was administered so that equal amounts of the injectate were placed at each foramen (level) into the transforaminal epidural space.   Additional Comments:  The patient tolerated the procedure well Dressing: Band-Aid    Post-procedure details: Patient was observed during the procedure. Post-procedure instructions were reviewed.  Patient left the clinic in stable condition.

## 2018-03-01 NOTE — Progress Notes (Signed)
Joshua [name] - 67 y.o. male MRN [id number]  Date of birth: 08-29-1951  Office Visit Note: Visit Date: 02/28/2018 PCP: Leanna Battles, MD Referred by: Leanna Battles, MD  Subjective: Chief Complaint  Patient presents with  . Lower Back - Pain  . Left Leg - Pain   HPI: Joshua [name] is a 67 year old gentleman with chronic worsening left low back and left hip and leg pain in a radicular fashion.  His symptoms are worse with standing and ambulating and better at rest.  Medications have helped to a small degree.  He has had therapy and one prior epidural injection without much relief.  He has not noted any focal weakness.  Symptoms are really not changed much other than discontinued pain.  We completed general epidural injection diagnostically and therapeutically trying to get him back to work at night which is not very beneficial.  We went on to order an MRI of the lumbar spine because of medical issues he ended up having to have a lumbar CT myelogram with the results shown below and I did go over the images with him today and spine models.  He has severe multifactorial stenosis at L4-5 and this is likely the source of his pain.  Today we are going to complete diagnostic and hopefully therapeutic left L4 transforaminal epidural steroid injection.  The patient is on anticoagulation and we discussed that the new standard is to go ahead and complete lumbar transforaminal injections while still anticoagulated and this is the standard of care at this point.  Depending on his relief obviously if it was great relief and was long-lasting we will get him back to work and hopefully get him more active again.  If it does not help or is very short-lived we would refer him to a neurosurgeon for decompression laminectomy.  He has very very mild listhesis which one advantage of the CT myelogram is that  they can look at this dynamic really and it did not move any.   ROS Otherwise per HPI.  Assessment &  Plan: Visit Diagnoses:  1. Lumbar radiculopathy   2. Spinal stenosis of lumbar region with neurogenic claudication     Plan: No additional findings.   Meds & Orders:  Meds ordered this encounter  Medications  . betamethasone acetate-betamethasone sodium phosphate (CELESTONE) injection 12 mg    Orders Placed This Encounter  Procedures  . XR C-ARM NO REPORT  . Epidural Steroid injection    Follow-up: Return in about 2 weeks (around 03/14/2018).   Procedures: No procedures performed  Lumbosacral Transforaminal Epidural Steroid Injection - Sub-Pedicular Approach with Fluoroscopic Guidance  Patient: Joshua [name]      Date of Birth: 09-09-1951 MRN: [id number] PCP: Leanna Battles, MD      Visit Date: 02/28/2018   Universal Protocol:    Date/Time: 02/28/2018  Consent Given By: the patient  Position: PRONE  Additional Comments: Vital signs were monitored before and after the procedure. Patient was prepped and draped in the usual sterile fashion. The correct patient, procedure, and site was verified.   Injection Procedure Details:  Procedure Site One Meds Administered:  Meds ordered this encounter  Medications  . betamethasone acetate-betamethasone sodium phosphate (CELESTONE) injection 12 mg    Laterality: Left  Location/Site:  L4-L5  Needle size: 22 G  Needle type: Spinal  Needle Placement: Transforaminal  Findings:    -Comments: Excellent flow of contrast along the nerve and into the epidural space.  Procedure Details: After squaring off  the end-plates to get a true AP view, the C-arm was positioned so that an oblique view of the foramen as noted above was visualized. The target area is just inferior to the "nose of the scotty dog" or sub pedicular. The soft tissues overlying this structure were infiltrated with 2-3 ml. of 1% Lidocaine without Epinephrine.  The spinal needle was inserted toward the target using a "trajectory" view along the fluoroscope  beam.  Under AP and lateral visualization, the needle was advanced so it did not puncture dura and was located close the 6 O'Clock position of the pedical in AP tracterory. Biplanar projections were used to confirm position. Aspiration was confirmed to be negative for CSF and/or blood. A 1-2 ml. volume of Isovue-250 was injected and flow of contrast was noted at each level. Radiographs were obtained for documentation purposes.   After attaining the desired flow of contrast documented above, a 0.5 to 1.0 ml test dose of 0.25% Marcaine was injected into each respective transforaminal space.  The patient was observed for 90 seconds post injection.  After no sensory deficits were reported, and normal lower extremity motor function was noted,   the above injectate was administered so that equal amounts of the injectate were placed at each foramen (level) into the transforaminal epidural space.   Additional Comments:  The patient tolerated the procedure well Dressing: Band-Aid    Post-procedure details: Patient was observed during the procedure. Post-procedure instructions were reviewed.  Patient left the clinic in stable condition.    Clinical History: LUMBAR MYELOGRAM  FLUOROSCOPY TIME:  Radiation Exposure Index (as provided by the fluoroscopic device): 462.89 uGy*m2  Fluoroscopy Time:  50 seconds  Number of Acquired Images:  0  PROCEDURE: After thorough discussion of risks and benefits of the procedure including bleeding, infection, injury to nerves, blood vessels, adjacent structures as well as headache and CSF leak, written and oral informed consent was obtained. Consent was obtained by Dr. San Morelle. Time out form was completed.  Patient was positioned prone on the fluoroscopy table. Local anesthesia was provided with 1% lidocaine without epinephrine after prepped and draped in the usual sterile fashion. Puncture was performed at L3-4 using a 3 1/2 inch 22-gauge  spinal needle via right paramedian approach. Using a single pass through the dura, the needle was placed within the thecal sac, with return of clear CSF. 15 mL of Isovue M-200 was injected into the thecal sac, with normal opacification of the nerve roots and cauda equina consistent with free flow within the subarachnoid space.  I personally performed the lumbar puncture and administered the intrathecal contrast. I also personally supervised acquisition of the myelogram images.  TECHNIQUE: Contiguous axial images were obtained through the Lumbar spine after the intrathecal infusion of infusion. Coronal and sagittal reconstructions were obtained of the axial image sets.  COMPARISON:  Lumbar spine radiographs 12/02/2017. CT of the abdomen pelvis 03/28/2014  FINDINGS: LUMBAR MYELOGRAM FINDINGS:  Five non rib-bearing lumbar type vertebral bodies are present. Severe central canal stenosis is present at L4-5. A more mild rightward disc protrusion is present at L3-4 with mild right subarticular narrowing. Significant stenosis is evident at L5-S1. Slight anterolisthesis at L4-5 does not change significantly with flexion or extension. Slight retrolisthesis at L 2 3 is also stable. The disc protrusion at L4-5 is slightly more profound with standing.  Fused anterior osteophytes are present in the lower thoracic spine to the level of L1-2.  CT LUMBAR MYELOGRAM FINDINGS:  The lumbar spine is  imaged from the midbody of T11 through S3-4. Five non rib-bearing lumbar type vertebral bodies are present. Slight retrolisthesis is present at L2-3 and L3-4. Anterolisthesis at L4-5 is less pronounced than on the prone and upright images.  Atherosclerotic calcifications are present in the aorta and branch vessels without aneurysm or significant stenosis. The soft tissues are otherwise unremarkable. No significant adenopathy is present.  L1-2: Negative.  L2-3: Moderate facet hypertrophy  is present bilaterally. There is some disc bulging without significant stenosis.  L3-4: A broad-based disc protrusion is present. Moderate facet hypertrophy is noted. This results in mild subarticular narrowing bilaterally, worse on right.  L4-5: A broad-based disc protrusion is present. Advanced facet hypertrophy is noted bilaterally. Severe central canal stenosis is present. Moderate foraminal narrowing is worse left than right.  L5-S1: Moderate facet hypertrophy is worse on the right. Mild disc bulging. No significant central canal stenosis is present. Moderate right foraminal stenosis is present.  IMPRESSION: 1. Severe central canal stenosis at L4-5 with moderate foraminal narrowing bilaterally, left greater than right. 2. Slight anterolisthesis at L4-5 does not change significantly with flexion or extension, but is worse in the prone and upright positions than in the supine position suggesting some mobility at this level. 3. Moderate right foraminal stenosis at L5-S1 secondary to facet spurring. 4. Slight retrolisthesis at L3-4 with mild subarticular narrowing bilaterally. Moderate facet hypertrophy is present at this level. 5. Moderate facet hypertrophy at L2-3 with slight retrolisthesis but no focal stenosis. 6.  Aortic Atherosclerosis (ICD10-I70.0).   Electronically Signed   By: San Morelle M.D.   On: 02/07/2018 12:20   He reports that he has never smoked. He has quit using smokeless tobacco. No results for input(s): HGBA1C, LABURIC in the last 8760 hours.  Objective:  VS:  HT:    WT:   BMI:     BP:(!) 157/77  HR:(!) 54bpm  TEMP: ( )  RESP:  Physical Exam  Ortho Exam Imaging: Xr C-arm No Report  Result Date: 02/28/2018 Please see Notes or Procedures tab for imaging impression.   Past Medical/Family/Surgical/Social History: Medications & Allergies reviewed per EMR, new medications updated. Patient Active Problem List   Diagnosis Date Noted   . Hearing problem   . Chest pain, unspecified 06/23/2016  . Abnormal nuclear stress test 09/27/2014  . Crescendo angina ([place]) 09/26/2014  . Pulmonary hypertension ([place]) 09/26/2014  . Internal hemorrhoids 01/19/2014  . Diverticulosis of colon without hemorrhage 01/19/2014  . Paroxysmal atrial fibrillation ([place]) 04/29/2010  . PALPITATIONS 04/21/2010  . HEMORRHOIDS-EXTERNAL 11/01/2009  . HYPERKALEMIA 09/09/2009  . DM 08/16/2009  . HYPERLIPIDEMIA 08/16/2009  . Essential hypertension 08/16/2009  . Coronary artery disease due to lipid rich plaque 08/16/2009  . RENAL INSUFFICIENCY 08/16/2009   Past Medical History:  Diagnosis Date  . Arthritis   . Atrial fibrillation ([place])   . Atrial flutter ([place])   . Back pain   . CAD (coronary artery disease)   . DM (diabetes mellitus) ([place])   . Elevated PSA   . Gout   . Hearing problem   . HTN (hypertension)   . Hyperlipidemia   . Myocardial infarct, old 2010  . Prostatitis   . Renal insufficiency    Family History  Problem Relation Age of Onset  . Hypertension Mother   . Gout Mother   . Alzheimer's disease Mother   . Heart failure Father   . Heart disease Brother   . Cancer Brother        type  unknown   Past Surgical History:  Procedure Laterality Date  . CARDIAC CATHETERIZATION  11/07/07   with 2 stents  . 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    Marland Kitchen EXTERNAL EAR SURGERY Right    cartilage  . LEFT AND RIGHT HEART CATHETERIZATION WITH CORONARY/GRAFT ANGIOGRAM N/A 09/27/2014   Procedure: LEFT AND RIGHT HEART CATHETERIZATION WITH Beatrix Fetters;  Surgeon: Sinclair Grooms, MD;  Location: [facility] CATH LAB;  Service: Cardiovascular;  Laterality: N/A;  . VASECTOMY     Social History   Occupational History  . Occupation: SERV TECH    Employer: [employer]  Tobacco Use  . Smoking status: Never Smoker  . Smokeless tobacco: Former Network engineer and Sexual Activity  . Alcohol  use: No  . Drug use: No  . Sexual activity: Not on file

## 2018-03-03 ENCOUNTER — Encounter (INDEPENDENT_AMBULATORY_CARE_PROVIDER_SITE_OTHER): Payer: Self-pay | Admitting: Physical Medicine and Rehabilitation

## 2018-03-04 DIAGNOSIS — I480 Paroxysmal atrial fibrillation: Secondary | ICD-10-CM | POA: Diagnosis not present

## 2018-03-04 DIAGNOSIS — I1310 Hypertensive heart and chronic kidney disease without heart failure, with stage 1 through stage 4 chronic kidney disease, or unspecified chronic kidney disease: Secondary | ICD-10-CM | POA: Diagnosis not present

## 2018-03-04 DIAGNOSIS — E1129 Type 2 diabetes mellitus with other diabetic kidney complication: Secondary | ICD-10-CM | POA: Diagnosis not present

## 2018-03-04 DIAGNOSIS — N183 Chronic kidney disease, stage 3 (moderate): Secondary | ICD-10-CM | POA: Diagnosis not present

## 2018-03-04 DIAGNOSIS — I10 Essential (primary) hypertension: Secondary | ICD-10-CM | POA: Diagnosis not present

## 2018-03-04 DIAGNOSIS — I2510 Atherosclerotic heart disease of native coronary artery without angina pectoris: Secondary | ICD-10-CM | POA: Diagnosis not present

## 2018-03-04 DIAGNOSIS — Z0000 Encounter for general adult medical examination without abnormal findings: Secondary | ICD-10-CM | POA: Diagnosis not present

## 2018-03-04 DIAGNOSIS — R972 Elevated prostate specific antigen [PSA]: Secondary | ICD-10-CM | POA: Diagnosis not present

## 2018-03-04 DIAGNOSIS — E7849 Other hyperlipidemia: Secondary | ICD-10-CM | POA: Diagnosis not present

## 2018-03-22 ENCOUNTER — Encounter (INDEPENDENT_AMBULATORY_CARE_PROVIDER_SITE_OTHER): Payer: Self-pay | Admitting: Physical Medicine and Rehabilitation

## 2018-03-22 ENCOUNTER — Ambulatory Visit (INDEPENDENT_AMBULATORY_CARE_PROVIDER_SITE_OTHER): Payer: Medicare HMO | Admitting: Physical Medicine and Rehabilitation

## 2018-03-22 VITALS — BP 164/82 | HR 57 | Temp 97.7°F

## 2018-03-22 DIAGNOSIS — G8929 Other chronic pain: Secondary | ICD-10-CM | POA: Diagnosis not present

## 2018-03-22 DIAGNOSIS — M25552 Pain in left hip: Secondary | ICD-10-CM | POA: Diagnosis not present

## 2018-03-22 DIAGNOSIS — M5442 Lumbago with sciatica, left side: Secondary | ICD-10-CM

## 2018-03-22 DIAGNOSIS — M5416 Radiculopathy, lumbar region: Secondary | ICD-10-CM | POA: Diagnosis not present

## 2018-03-22 DIAGNOSIS — M48062 Spinal stenosis, lumbar region with neurogenic claudication: Secondary | ICD-10-CM

## 2018-03-22 NOTE — Progress Notes (Signed)
Joshua [name] - 67 y.o. male MRN [id number]  Date of birth: 06-May-1951  Office Visit Note: Visit Date: 03/22/2018 PCP: Leanna Battles, MD Referred by: Leanna Battles, MD  Subjective: Chief Complaint  Patient presents with  . Lower Back - Pain  . Right Leg - Pain  . Left Leg - Pain   HPI: Joshua [name] is a 67 year old gentleman that comes in today after having had transforaminal epidural steroid injection at the level of stenosis at L4 with really not much relief at all.  He continues to rate his average pain is a 6 out of 10 and has mostly with walking standing and bending.  He does get improvement with sitting and it improves with rest.  Again very consistent with lumbar stenosis type symptoms and claudication.  By brief review he began having symptoms on the left side with posterior hip pain which is very sharp with standing and walking in the latter part of 2018.  He went through conservative care with his primary care physician Dr. Janie Morning.  After failing conservative management with medications as well as therapy the patient did see me in March of this year.  We completed general interlaminar injection at that time with no relief.  We also held the patient out of work because he just could not complete his work duties at that point with increasing pain with standing and ambulating.  He tried activity modification and medications without really much relief.  We ultimately did get a CT myelogram performed.  He had some medical issues and could not obtain MRI.  He is on chronic anticoagulation.  He has not had any focal weakness or bowel or bladder changes or any other red flag complaints.  CT myelogram did show severe stenosis at L4-5 consistent with his symptoms.  There is very slight listhesis which is not dynamic.  Again he went on to have transforaminal injections with no relief either.  He comes in today with now worsening symptoms to a degree because it has started to recur on the  right side as well.  Again no focal weakness or foot drop or other red flag issues.   Review of Systems  Constitutional: Negative for chills, fever, malaise/fatigue and weight loss.  HENT: Negative for hearing loss and sinus pain.   Eyes: Negative for blurred vision, double vision and photophobia.  Respiratory: Negative for cough and shortness of breath.   Cardiovascular: Negative for chest pain, palpitations and leg swelling.  Gastrointestinal: Negative for abdominal pain, nausea and vomiting.  Genitourinary: Negative for flank pain.  Musculoskeletal: Positive for back pain. Negative for myalgias.       Left more than right hip and leg pain  Skin: Negative for itching and rash.  Neurological: Negative for tremors, focal weakness and weakness.  Endo/Heme/Allergies: Negative.   Psychiatric/Behavioral: Negative for depression.  All other systems reviewed and are negative.  Otherwise per HPI.  Assessment & Plan: Visit Diagnoses:  1. Lumbar radiculopathy   2. Spinal stenosis of lumbar region with neurogenic claudication   3. Pain in left hip   4. Chronic left-sided low back pain with left-sided sciatica     Plan: Findings:  Chronic worsening severe low back pain with left more than right hip and leg pain consistent with neurogenic claudication from severe multifactorial stenosis at L4-5.  Patient's case complicated with anticoagulation and just no help with epidural injections or other care at this point.  I think we do need to refer him  to Dr. Sherley Bounds at [facility] for consultation of his spinal issues.  Hopefully this could be something as simple as a decompression without fusion but he will get full discussion with Dr. Ronnald Ramp about options available.  Patient will continue to be out of work at this point.  His company appears to have a date of October as being sort of a deadline to look at returning to work without any issues.  I do feel like if he did not  have the nerve type pain particularly on the left that he would be able to work again and there is no other physical deficits hindering him at that point.  I do think surgery is probably the right answer for him and he is looking eager to try to get some relief.    Meds & Orders: No orders of the defined types were placed in this encounter.   Orders Placed This Encounter  Procedures  . Ambulatory referral to Neurosurgery    Follow-up: Return if symptoms worsen or fail to improve.   Procedures: No procedures performed  No notes on file   Clinical History: LUMBAR MYELOGRAM  FLUOROSCOPY TIME:  Radiation Exposure Index (as provided by the fluoroscopic device): 462.89 uGy*m2  Fluoroscopy Time:  50 seconds  Number of Acquired Images:  0  PROCEDURE: After thorough discussion of risks and benefits of the procedure including bleeding, infection, injury to nerves, blood vessels, adjacent structures as well as headache and CSF leak, written and oral informed consent was obtained. Consent was obtained by Dr. San Morelle. Time out form was completed.  Patient was positioned prone on the fluoroscopy table. Local anesthesia was provided with 1% lidocaine without epinephrine after prepped and draped in the usual sterile fashion. Puncture was performed at L3-4 using a 3 1/2 inch 22-gauge spinal needle via right paramedian approach. Using a single pass through the dura, the needle was placed within the thecal sac, with return of clear CSF. 15 mL of Isovue M-200 was injected into the thecal sac, with normal opacification of the nerve roots and cauda equina consistent with free flow within the subarachnoid space.  I personally performed the lumbar puncture and administered the intrathecal contrast. I also personally supervised acquisition of the myelogram images.  TECHNIQUE: Contiguous axial images were obtained through the Lumbar spine after the intrathecal infusion of  infusion. Coronal and sagittal reconstructions were obtained of the axial image sets.  COMPARISON:  Lumbar spine radiographs 12/02/2017. CT of the abdomen pelvis 03/28/2014  FINDINGS: LUMBAR MYELOGRAM FINDINGS:  Five non rib-bearing lumbar type vertebral bodies are present. Severe central canal stenosis is present at L4-5. A more mild rightward disc protrusion is present at L3-4 with mild right subarticular narrowing. Significant stenosis is evident at L5-S1. Slight anterolisthesis at L4-5 does not change significantly with flexion or extension. Slight retrolisthesis at L 2 3 is also stable. The disc protrusion at L4-5 is slightly more profound with standing.  Fused anterior osteophytes are present in the lower thoracic spine to the level of L1-2.  CT LUMBAR MYELOGRAM FINDINGS:  The lumbar spine is imaged from the midbody of T11 through S3-4. Five non rib-bearing lumbar type vertebral bodies are present. Slight retrolisthesis is present at L2-3 and L3-4. Anterolisthesis at L4-5 is less pronounced than on the prone and upright images.  Atherosclerotic calcifications are present in the aorta and branch vessels without aneurysm or significant stenosis. The soft tissues are otherwise unremarkable. No significant adenopathy is present.  L1-2: Negative.  L2-3: Moderate facet hypertrophy is present bilaterally. There is some disc bulging without significant stenosis.  L3-4: A broad-based disc protrusion is present. Moderate facet hypertrophy is noted. This results in mild subarticular narrowing bilaterally, worse on right.  L4-5: A broad-based disc protrusion is present. Advanced facet hypertrophy is noted bilaterally. Severe central canal stenosis is present. Moderate foraminal narrowing is worse left than right.  L5-S1: Moderate facet hypertrophy is worse on the right. Mild disc bulging. No significant central canal stenosis is present. Moderate right foraminal  stenosis is present.  IMPRESSION: 1. Severe central canal stenosis at L4-5 with moderate foraminal narrowing bilaterally, left greater than right. 2. Slight anterolisthesis at L4-5 does not change significantly with flexion or extension, but is worse in the prone and upright positions than in the supine position suggesting some mobility at this level. 3. Moderate right foraminal stenosis at L5-S1 secondary to facet spurring. 4. Slight retrolisthesis at L3-4 with mild subarticular narrowing bilaterally. Moderate facet hypertrophy is present at this level. 5. Moderate facet hypertrophy at L2-3 with slight retrolisthesis but no focal stenosis. 6.  Aortic Atherosclerosis (ICD10-I70.0).   Electronically Signed   By: San Morelle M.D.   On: 02/07/2018 12:20   He reports that he has never smoked. He has quit using smokeless tobacco. No results for input(s): HGBA1C, LABURIC in the last 8760 hours.  Objective:  VS:  HT:    WT:   BMI:     BP:(!) 164/82  HR:(!) 57bpm  TEMP:97.7 F (36.5 C)(Oral)  RESP:98 % Physical Exam  Constitutional: He is oriented to person, place, and time. He appears well-developed and well-nourished. No distress.  Obese with BMI 42  HENT:  Head: Normocephalic and atraumatic.  Nose: Nose normal.  Mouth/Throat: Oropharynx is clear and moist.  Eyes: Pupils are equal, round, and reactive to light. Conjunctivae are normal.  Neck: Neck supple. No JVD present. No tracheal deviation present.  Cardiovascular: Regular rhythm and intact distal pulses.  Pulmonary/Chest: Effort normal and breath sounds normal.  Abdominal: Soft. He exhibits no distension. There is no rebound and no guarding.  Musculoskeletal: He exhibits no deformity.  Patient stands with forward flexed lumbar spine and he is stiff with extension with some pain with extension.  No pain with hip internal or external.  No pain over the greater trochanters and is good distal strength.  He does  ambulate without aid.  He has no clonus bilaterally.  Ambulates with an antalgic gait.  Neurological: He is alert and oriented to person, place, and time. He exhibits normal muscle tone. Coordination normal.  Skin: Skin is warm. No rash noted.  Psychiatric: He has a normal mood and affect. His behavior is normal.  Nursing note and vitals reviewed.   Ortho Exam Imaging: No results found.  Past Medical/Family/Surgical/Social History: Medications & Allergies reviewed per EMR, new medications updated. Patient Active Problem List   Diagnosis Date Noted  . Hearing problem   . Chest pain, unspecified 06/23/2016  . Abnormal nuclear stress test 09/27/2014  . Crescendo angina ([place]) 09/26/2014  . Pulmonary hypertension ([place]) 09/26/2014  . Internal hemorrhoids 01/19/2014  . Diverticulosis of colon without hemorrhage 01/19/2014  . Paroxysmal atrial fibrillation ([place]) 04/29/2010  . PALPITATIONS 04/21/2010  . HEMORRHOIDS-EXTERNAL 11/01/2009  . HYPERKALEMIA 09/09/2009  . DM 08/16/2009  . HYPERLIPIDEMIA 08/16/2009  . Essential hypertension 08/16/2009  . Coronary artery disease due to lipid rich plaque 08/16/2009  . RENAL INSUFFICIENCY 08/16/2009   Past Medical History:  Diagnosis  Date  . Arthritis   . Atrial fibrillation ([place])   . Atrial flutter ([place])   . Back pain   . CAD (coronary artery disease)   . DM (diabetes mellitus) ([place])   . Elevated PSA   . Gout   . Hearing problem   . HTN (hypertension)   . Hyperlipidemia   . Myocardial infarct, old 2010  . Prostatitis   . Renal insufficiency    Family History  Problem Relation Age of Onset  . Hypertension Mother   . Gout Mother   . Alzheimer's disease Mother   . Heart failure Father   . Heart disease Brother   . Cancer Brother        type unknown   Past Surgical History:  Procedure Laterality Date  . CARDIAC CATHETERIZATION  11/07/07   with 2 stents  . Electrophysiologic study and RF catheter ablation   of AV node reentrant  tachycardia.    Marland Kitchen EXTERNAL EAR SURGERY Right    cartilage  . LEFT AND RIGHT HEART CATHETERIZATION WITH CORONARY/GRAFT ANGIOGRAM N/A 09/27/2014   Procedure: LEFT AND RIGHT HEART CATHETERIZATION WITH Beatrix Fetters;  Surgeon: Sinclair Grooms, MD;  Location: [facility] CATH LAB;  Service: Cardiovascular;  Laterality: N/A;  . VASECTOMY     Social History   Occupational History  . Occupation: SERV TECH    Employer: [employer]  Tobacco Use  . Smoking status: Never Smoker  . Smokeless tobacco: Former Network engineer and Sexual Activity  . Alcohol use: No  . Drug use: No  . Sexual activity: Not on file

## 2018-03-22 NOTE — Progress Notes (Signed)
 .  Numeric Pain Rating Scale and Functional Assessment Average Pain 6 Pain Right Now 3 My pain is constant Pain is worse with: walking, bending, standing and some activites Pain improves with: rest   In the last MONTH (on 0-10 scale) has pain interfered with the following?  1. General activity like being  able to carry out your everyday physical activities such as walking, climbing stairs, carrying groceries, or moving a chair?  Rating(6)  2. Relation with others like being able to carry out your usual social activities and roles such as  activities at home, at work and in your community. Rating(3)  3. Enjoyment of life such that you have  been bothered by emotional problems such as feeling anxious, depressed or irritable?  Rating(0)

## 2018-03-29 ENCOUNTER — Other Ambulatory Visit: Payer: Self-pay | Admitting: Internal Medicine

## 2018-03-29 DIAGNOSIS — I480 Paroxysmal atrial fibrillation: Secondary | ICD-10-CM

## 2018-03-29 NOTE — Telephone Encounter (Signed)
Pt is 67 yr old male who has a pending appt with Dr Lovena Le on 05/20/18, was last seen on 04/12/17. Last noted weight on 12/31/17 with GMA was 126.5Kg.  SCr 1.4 on 02/25/18 with GMA. CrCl is 89 mL/min. Will refill Pradaxa 150mg  BID.

## 2018-04-06 ENCOUNTER — Other Ambulatory Visit: Payer: Self-pay | Admitting: Internal Medicine

## 2018-04-06 DIAGNOSIS — I2510 Atherosclerotic heart disease of native coronary artery without angina pectoris: Secondary | ICD-10-CM

## 2018-04-06 DIAGNOSIS — I10 Essential (primary) hypertension: Secondary | ICD-10-CM

## 2018-04-18 ENCOUNTER — Other Ambulatory Visit: Payer: Self-pay | Admitting: Neurological Surgery

## 2018-04-18 ENCOUNTER — Telehealth: Payer: Self-pay

## 2018-04-18 DIAGNOSIS — M5416 Radiculopathy, lumbar region: Secondary | ICD-10-CM | POA: Diagnosis not present

## 2018-04-18 NOTE — Telephone Encounter (Signed)
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 Lovena Le or APP on Care team.

## 2018-04-18 NOTE — Telephone Encounter (Signed)
   Primary Cardiologist:Gregg Lovena Le, MD   Pre-op covering staff: - Please schedule appointment with Dr. Lovena Le, PA/NP on his Care Team or the first available provider. - Ccall patient to inform them. - Forward phone note to the provider that will see the patient. - Please contact requesting surgeon's office via preferred method (i.e, phone, fax) to inform them of need for appointment prior to surgery.  Chart reviewed as part of pre-operative protocol coverage. Because of Joshua [name]'s past medical history and time since last visit, he/she will require a follow-up visit in order to better assess preoperative cardiovascular risk.  Joshua Dopp, PA-C  04/18/2018, 2:54 PM

## 2018-04-18 NOTE — Telephone Encounter (Signed)
   [facility] Pre-operative Risk Assessment    Request for surgical clearance:  1. What type of surgery is being performed?    - Left Lumbar 4-5 Laminectomy/Foraminotomy   2. When is this surgery scheduled?    - 04/29/2018   3. Are there any medications that need to be held prior to surgery and how long?   - Pradaxa and Aspirin: please advise   4. Practice name and name of physician performing surgery?    - [facility]   - Dr. Sherley Bounds   5. What is your office phone and fax number?    - Phone: [phone]   - Fax: [phone]  6. Anesthesia type (None, local, MAC, general) ?    - N/A   Velna Ochs 04/18/2018, 2:07 PM  _________________________________________________________________   (provider comments below)

## 2018-04-25 NOTE — Pre-Procedure Instructions (Signed)
Joshua [name]  04/25/2018      [id number] [name], [place] [address] Phone: [phone] Fax: [phone]    Your procedure is scheduled on Friday August 2.  Report to [facility] Admitting at 9:30 A.M.  Call this number if you have problems the morning of surgery:  [phone]   Remember:  Do not eat or drink after midnight.   Take these medicines the morning of surgery with A SIP OF WATER:   Amlodipine (nrovasc) Atenolol (Tenormin) Hydrocodone-acetaminophen (Norco) if needed  DO NOT TAKE Pioglitazone (Actos) the day of surgery  7 days prior to surgery STOP taking any Aspirin(unless otherwise instructed by your surgeon), Aleve, Naproxen, Ibuprofen, Motrin, Advil, Goody's, BC's, all herbal medications, fish oil, and all vitamins  **FOLLOW your surgeon's instructions on stopping Aspirin and Pradaxa. If no instructions were given, please call your surgeon's office**     How to Manage Your Diabetes Before and After Surgery  Why is it important to control my blood sugar before and after surgery? . Improving blood sugar levels before and after surgery helps healing and can limit problems. . A way of improving blood sugar control is eating a healthy diet by: o  Eating less sugar and carbohydrates o  Increasing activity/exercise o  Talking with your doctor about reaching your blood sugar goals . High blood sugars (greater than 180 mg/dL) can raise your risk of infections and slow your recovery, so you will need to focus on controlling your diabetes during the weeks before surgery. . Make sure that the doctor who takes care of your diabetes knows about your planned surgery including the date and location.  How do I manage my blood sugar before surgery? . Check your blood sugar at least 4 times a day, starting 2 days before surgery, to make sure that the level is not too high or  low. o Check your blood sugar the morning of your surgery when you wake up and every 2 hours until you get to the Short Stay unit. . If your blood sugar is less than 70 mg/dL, you will need to treat for low blood sugar: o Do not take insulin. o Treat a low blood sugar (less than 70 mg/dL) with  cup of clear juice (cranberry or apple), 4 glucose tablets, OR glucose gel. Recheck blood sugar in 15 minutes after treatment (to make sure it is greater than 70 mg/dL). If your blood sugar is not greater than 70 mg/dL on recheck, call [phone] o  for further instructions. . Report your blood sugar to the short stay nurse when you get to Short Stay.  . If you are admitted to the hospital after surgery: o Your blood sugar will be checked by the staff and you will probably be given insulin after surgery (instead of oral diabetes medicines) to make sure you have good blood sugar levels. o The goal for blood sugar control after surgery is 80-180 mg/dL.              Do not wear jewelry  Do not wear lotions, powders, or colognes, or deodorant.  Do not shave 48 hours prior to surgery.  Men may shave face and neck.  Do not bring valuables to the hospital.  [facility] is not responsible for any belongings or valuables.  Contacts, dentures or bridgework may not be worn into surgery.  Leave your suitcase  in the car.  After surgery it may be brought to your room.  For patients admitted to the hospital, discharge time will be determined by your treatment team.  Patients discharged the day of surgery will not be allowed to drive home.   Special instructions:    [place]- Preparing For Surgery  Before surgery, you can play an important role. Because skin is not sterile, your skin needs to be as free of germs as possible. You can reduce the number of germs on your skin by washing with CHG (chlorahexidine gluconate) Soap before surgery.  CHG is an antiseptic cleaner which kills germs and bonds  with the skin to continue killing germs even after washing.    Oral Hygiene is also important to reduce your risk of infection.  Remember - BRUSH YOUR TEETH THE MORNING OF SURGERY WITH YOUR REGULAR TOOTHPASTE  Please do not use if you have an allergy to CHG or antibacterial soaps. If your skin becomes reddened/irritated stop using the CHG.  Do not shave (including legs and underarms) for at least 48 hours prior to first CHG shower. It is OK to shave your face.  Please follow these instructions carefully.   1. Shower the NIGHT BEFORE SURGERY and the MORNING OF SURGERY with CHG.   2. If you chose to wash your hair, wash your hair first as usual with your normal shampoo.  3. After you shampoo, rinse your hair and body thoroughly to remove the shampoo.  4. Use CHG as you would any other liquid soap. You can apply CHG directly to the skin and wash gently with a scrungie or a clean washcloth.   5. Apply the CHG Soap to your body ONLY FROM THE NECK DOWN.  Do not use on open wounds or open sores. Avoid contact with your eyes, ears, mouth and genitals (private parts). Wash Face and genitals (private parts)  with your normal soap.  6. Wash thoroughly, paying special attention to the area where your surgery will be performed.  7. Thoroughly rinse your body with warm water from the neck down.  8. DO NOT shower/wash with your normal soap after using and rinsing off the CHG Soap.  9. Pat yourself dry with a CLEAN TOWEL.  10. Wear CLEAN PAJAMAS to bed the night before surgery, wear comfortable clothes the morning of surgery  11. Place CLEAN SHEETS on your bed the night of your first shower and DO NOT SLEEP WITH PETS.    Day of Surgery:  Do not apply any deodorants/lotions.  Please wear clean clothes to the hospital/surgery center.   Remember to brush your teeth WITH YOUR REGULAR TOOTHPASTE.    Please read over the following fact sheets that you were given. Coughing and Deep Breathing, MRSA  Information and Surgical Site Infection Prevention

## 2018-04-26 ENCOUNTER — Encounter: Payer: Self-pay | Admitting: Internal Medicine

## 2018-04-26 ENCOUNTER — Other Ambulatory Visit: Payer: Self-pay

## 2018-04-26 ENCOUNTER — Ambulatory Visit: Payer: Medicare HMO | Admitting: Internal Medicine

## 2018-04-26 ENCOUNTER — Encounter (HOSPITAL_COMMUNITY): Payer: Self-pay

## 2018-04-26 ENCOUNTER — Encounter (HOSPITAL_COMMUNITY)
Admission: RE | Admit: 2018-04-26 | Discharge: 2018-04-26 | Disposition: A | Discharge status: Home / Self Care | Payer: MEDICARE | Source: Ambulatory Visit | Attending: Neurological Surgery | Admitting: Neurological Surgery

## 2018-04-26 VITALS — BP 140/80 | HR 62 | Ht 68.0 in | Wt 294.0 lb

## 2018-04-26 DIAGNOSIS — I4891 Unspecified atrial fibrillation: Secondary | ICD-10-CM | POA: Diagnosis not present

## 2018-04-26 DIAGNOSIS — Z7982 Long term (current) use of aspirin: Secondary | ICD-10-CM | POA: Diagnosis not present

## 2018-04-26 DIAGNOSIS — M1990 Unspecified osteoarthritis, unspecified site: Secondary | ICD-10-CM | POA: Diagnosis present

## 2018-04-26 DIAGNOSIS — I4892 Unspecified atrial flutter: Secondary | ICD-10-CM | POA: Diagnosis not present

## 2018-04-26 DIAGNOSIS — H9190 Unspecified hearing loss, unspecified ear: Secondary | ICD-10-CM | POA: Diagnosis present

## 2018-04-26 DIAGNOSIS — I480 Paroxysmal atrial fibrillation: Secondary | ICD-10-CM | POA: Diagnosis not present

## 2018-04-26 DIAGNOSIS — I10 Essential (primary) hypertension: Secondary | ICD-10-CM | POA: Diagnosis not present

## 2018-04-26 DIAGNOSIS — I2510 Atherosclerotic heart disease of native coronary artery without angina pectoris: Secondary | ICD-10-CM | POA: Diagnosis not present

## 2018-04-26 DIAGNOSIS — Z955 Presence of coronary angioplasty implant and graft: Secondary | ICD-10-CM | POA: Diagnosis not present

## 2018-04-26 DIAGNOSIS — R972 Elevated prostate specific antigen [PSA]: Secondary | ICD-10-CM | POA: Diagnosis present

## 2018-04-26 DIAGNOSIS — Z9852 Vasectomy status: Secondary | ICD-10-CM | POA: Diagnosis not present

## 2018-04-26 DIAGNOSIS — E785 Hyperlipidemia, unspecified: Secondary | ICD-10-CM | POA: Diagnosis not present

## 2018-04-26 DIAGNOSIS — Z6841 Body Mass Index (BMI) 40.0 and over, adult: Secondary | ICD-10-CM | POA: Diagnosis not present

## 2018-04-26 DIAGNOSIS — M109 Gout, unspecified: Secondary | ICD-10-CM | POA: Diagnosis present

## 2018-04-26 DIAGNOSIS — I252 Old myocardial infarction: Secondary | ICD-10-CM | POA: Diagnosis not present

## 2018-04-26 DIAGNOSIS — M7138 Other bursal cyst, other site: Secondary | ICD-10-CM | POA: Diagnosis not present

## 2018-04-26 DIAGNOSIS — M48061 Spinal stenosis, lumbar region without neurogenic claudication: Secondary | ICD-10-CM | POA: Diagnosis not present

## 2018-04-26 DIAGNOSIS — E119 Type 2 diabetes mellitus without complications: Secondary | ICD-10-CM | POA: Diagnosis not present

## 2018-04-26 HISTORY — DX: Adverse effect of unspecified anesthetic, initial encounter: T41.45XA

## 2018-04-26 HISTORY — DX: Other complications of anesthesia, initial encounter: T88.59XA

## 2018-04-26 LAB — BASIC METABOLIC PANEL
Anion gap: 10 (ref 5–15)
BUN: 30 mg/dL — AB (ref 8–23)
CO2: 24 mmol/L (ref 22–32)
CREATININE: 1.29 mg/dL — AB (ref 0.61–1.24)
Calcium: 9.8 mg/dL (ref 8.9–10.3)
Chloride: 108 mmol/L (ref 98–111)
GFR calc Af Amer: >60 mL/min (ref >60)
GFR, EST NON AFRICAN AMERICAN: 55 mL/min — AB (ref >60)
Glucose, Bld: 132 mg/dL — ABNORMAL HIGH (ref 70–99)
POTASSIUM: 4.3 mmol/L (ref 3.5–5.1)
SODIUM: 142 mmol/L (ref 135–145)

## 2018-04-26 LAB — HEMOGLOBIN A1C
HEMOGLOBIN A1C: 6 % — AB (ref 4.8–5.6)
Mean Plasma Glucose: 125.5 mg/dL

## 2018-04-26 LAB — CBC
HEMATOCRIT: 48.6 % (ref 39.0–52.0)
Hemoglobin: 15 g/dL (ref 13.0–17.0)
MCH: 30.2 pg (ref 26.0–34.0)
MCHC: 30.9 g/dL (ref 30.0–36.0)
MCV: 98 fL (ref 78.0–100.0)
PLATELETS: 218 10*3/uL (ref 150–400)
RBC: 4.96 MIL/uL (ref 4.22–5.81)
RDW: 14.1 % (ref 11.5–15.5)
WBC: 5.4 10*3/uL (ref 4.0–10.5)

## 2018-04-26 LAB — SURGICAL PCR SCREEN
MRSA, PCR: NEGATIVE
STAPHYLOCOCCUS AUREUS: NEGATIVE

## 2018-04-26 LAB — GLUCOSE, CAPILLARY: Glucose-Capillary: 122 mg/dL — ABNORMAL HIGH (ref 70–99)

## 2018-04-26 LAB — APTT: APTT: 30 s (ref 24–36)

## 2018-04-26 NOTE — Patient Instructions (Signed)

## 2018-04-26 NOTE — Progress Notes (Signed)
HPI Joshua [name] returns today for preoperative evaluation. He is well known to me from remote SVT ablation. He has PAF. He denies chest pain or sob. He has developed leg pain and back pain and is pending spinal surgery. He does not have angina. He has a remote stent.  His blood pressure has been a bit elevated. He denies medical non-compliance.  Allergies  Allergen Reactions  . Desipramine Hives and Itching  . Niacin Itching     Current Outpatient Medications  Medication Sig Dispense Refill  . acetaminophen (TYLENOL) 650 MG CR tablet Take 1,300 mg by mouth 2 (two) times daily.    Marland Kitchen amLODipine (NORVASC) 5 MG tablet TAKE 1 TABLET BY MOUTH EVERY DAY 90 tablet 3  . aspirin EC 81 MG tablet Take 81 mg by mouth daily.    Marland Kitchen atenolol (TENORMIN) 50 MG tablet Take 1 tablet (50 mg total) by mouth 2 (two) times daily. Please keep upcoming appt in August for future refills. Thank you 180 tablet 0  . atorvastatin (LIPITOR) 40 MG tablet Take 1 tablet (40 mg total) by mouth daily. Please keep upcoming appt in August for future refills. Thank you (Patient taking differently: Take 40 mg by mouth every evening. Please keep upcoming appt in August for future refills. Thank you) 90 tablet 0  . esomeprazole (NEXIUM) 20 MG capsule Take 20 mg by mouth every evening.    . fenofibrate micronized (LOFIBRA) 200 MG capsule Take 200 mg by mouth at bedtime.     . nitroGLYCERIN (NITROSTAT) 0.4 MG SL tablet Place 0.4 mg under the tongue every 5 (five) minutes x 3 doses as needed for chest pain.    . pioglitazone (ACTOS) 45 MG tablet Take 45 mg by mouth every evening.     Marland Kitchen PRADAXA 150 MG CAPS capsule TAKE 1 CAPSULE BY MOUTH TWICE A DAY 60 capsule 6  . quinapril (ACCUPRIL) 40 MG tablet Take 1 tablet (40 mg total) by mouth daily. Please keep upcoming appt in August for future refills. Thank you (Patient taking differently: Take 40 mg by mouth at bedtime. Please keep upcoming appt in August for future refills. Thank you)  90 tablet 0   No current facility-administered medications for this visit.      Past Medical History:  Diagnosis Date  . Arthritis   . Atrial fibrillation ([place])   . Atrial flutter ([place])   . Back pain   . CAD (coronary artery disease)    stents placed 2009  . Complication of anesthesia    woke up during surgery and ablation  . DM (diabetes mellitus) ([place])    type 2  . Elevated PSA   . Gout   . Hearing problem   . HTN (hypertension)   . Hyperlipidemia   . Myocardial infarct, old 2010  . Prostatitis   . Renal insufficiency     ROS:   All systems reviewed and negative except as noted in the HPI.   Past Surgical History:  Procedure Laterality Date  . CARDIAC CATHETERIZATION  11/07/07   with 2 stents  . COLONOSCOPY    . 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    Marland Kitchen EXTERNAL EAR SURGERY Right    cartilage  . LEFT AND RIGHT HEART CATHETERIZATION WITH CORONARY/GRAFT ANGIOGRAM N/A 09/27/2014   Procedure: LEFT AND RIGHT HEART CATHETERIZATION WITH Beatrix Fetters;  Surgeon: Sinclair Grooms, MD;  Location: [facility] CATH LAB;  Service:  Cardiovascular;  Laterality: N/A;  . TOOTH EXTRACTION    . VASECTOMY       Family History  Problem Relation Age of Onset  . Hypertension Mother   . Gout Mother   . Alzheimer's disease Mother   . Heart failure Father   . Heart disease Brother   . Cancer Brother        type unknown     Social History   Socioeconomic History  . Marital status: Married    Spouse name: Not on file  . Number of children: 2  . Years of education: Not on file  . Highest education level: Not on file  Occupational History  . Occupation: SERV TECH    Employer: [employer]  Social Needs  . Financial resource strain: Not on file  . Food insecurity:    Worry: Not on file    Inability: Not on file  . Transportation needs:    Medical: Not on file    Non-medical: Not on file  Tobacco Use  . Smoking status:  Never Smoker  . Smokeless tobacco: Former Systems developer    Types: Chew  Substance and Sexual Activity  . Alcohol use: No  . Drug use: No  . Sexual activity: Not on file  Lifestyle  . Physical activity:    Days per week: Not on file    Minutes per session: Not on file  . Stress: Not on file  Relationships  . Social connections:    Talks on phone: Not on file    Gets together: Not on file    Attends religious service: Not on file    Active member of club or organization: Not on file    Attends meetings of clubs or organizations: Not on file    Relationship status: Not on file  . Intimate partner violence:    Fear of current or ex partner: Not on file    Emotionally abused: Not on file    Physically abused: Not on file    Forced sexual activity: Not on file  Other Topics Concern  . Not on file  Social History Narrative  . Not on file     BP 140/80   Pulse 62   Ht 5\' 8"  (1.727 m)   Wt 294 lb (133.4 kg)   BMI 44.70 kg/m   Physical Exam:  obese appearing NAD HEENT: Unremarkable Neck:  No JVD, no thyromegally Lymphatics:  No adenopathy Back:  No CVA tenderness Lungs:  Clear with no wheezes HEART:  Regular rate rhythm, no murmurs, no rubs, no clicks Abd:  soft, positive bowel sounds, no organomegally, no rebound, no guarding Ext:  2 plus pulses, no edema, no cyanosis, no clubbing Skin:  No rashes no nodules Neuro:  CN II through XII intact, motor grossly intact  EKG - Sinus brady with no STT changes.  Assess/Plan: 1. Preoperative eval - he is low risk for major cardiac complications from his upcoming spinal surgery and may proceed with the surgery. 2. Atrial fib - he is maintaining nSR very nicely. He has held his Pradaxa for the past 3 days.  3. CAD - He is sedentary but he is having a low risk surgery. I have encouraged him to proceed. No change in his meds. 4. HTN - his blood pressure is reasonably well controlled. Hopefully after his surgery, exercise will be  possible.  Mikle Bosworth.D.

## 2018-04-26 NOTE — Progress Notes (Signed)
PCP - Leanna Battles Cardiologist - taylor  Chest x-ray - not needed EKG - 04/26/18 Stress Test - 2015 ECHO - 2015 Cardiac Cath - 2015  Sleep Study -  Sleep score sent to PCP    Fasting Blood Sugar - patient doesn't check blood sugars at home   Blood Thinner Instructions: last dose of Paradaxa 7/25 Aspirin Instructions: last dose 7/25  Anesthesia review: yes, hx CAD, MI, HTN - cardiac clearance appointment today 7/30 at 330 this afternoon  Patient denies shortness of breath, fever, cough and chest pain at PAT appointment   Patient verbalized understanding of instructions that were given to them at the PAT appointment. Patient was also instructed that they will need to review over the PAT instructions again at home before surgery.

## 2018-04-26 NOTE — Progress Notes (Signed)
   04/26/18 1783  OBSTRUCTIVE SLEEP APNEA  Have you ever been diagnosed with sleep apnea through a sleep study? No  Do you snore loudly (loud enough to be heard through closed doors)?  0  Do you often feel tired, fatigued, or sleepy during the daytime (such as falling asleep during driving or talking to someone)? 0  Has anyone observed you stop breathing during your sleep? 0  Do you have, or are you being treated for high blood pressure? 1  BMI more than 35 kg/m2? 1  Age > 50 (1-yes) 1  Neck circumference greater than:Male 16 inches or larger, Male 17inches or larger? 1  Male Gender (Yes=1) 1  Obstructive Sleep Apnea Score 5  Score 5 or greater  Results sent to PCP

## 2018-04-26 NOTE — Pre-Procedure Instructions (Signed)
Joshua [name]  04/26/2018      [id number] [name], [place] [address] Phone: [phone] Fax: [phone]    Your procedure is scheduled on Friday August 2.  Report to [facility] Admitting at 9:30 A.M.  Call this number if you have problems the morning of surgery:  [phone]   Remember:  Do not eat or drink after midnight.   Take these medicines the morning of surgery with A SIP OF WATER:   Amlodipine (nrovasc) Atenolol (Tenormin) Hydrocodone-acetaminophen (Norco) if needed  7 days prior to surgery STOP taking any Aspirin(unless otherwise instructed by your surgeon), Aleve, Naproxen, Ibuprofen, Motrin, Advil, Goody's, BC's, all herbal medications, fish oil, and all vitamins  **FOLLOW your surgeon's instructions on stopping Aspirin and Pradaxa. If no instructions were given, please call your surgeon's office**   WHAT DO I DO ABOUT MY DIABETES MEDICATION?   Marland Kitchen Do not take oral diabetes medicines (pills) the morning of surgery.  pioglitazone (ACTOS)   How to Manage Your Diabetes Before and After Surgery  Why is it important to control my blood sugar before and after surgery? . Improving blood sugar levels before and after surgery helps healing and can limit problems. . A way of improving blood sugar control is eating a healthy diet by: o  Eating less sugar and carbohydrates o  Increasing activity/exercise o  Talking with your doctor about reaching your blood sugar goals . High blood sugars (greater than 180 mg/dL) can raise your risk of infections and slow your recovery, so you will need to focus on controlling your diabetes during the weeks before surgery. . Make sure that the doctor who takes care of your diabetes knows about your planned surgery including the date and location.  How do I manage my blood sugar before surgery? . Check your blood sugar at least 4 times a  day, starting 2 days before surgery, to make sure that the level is not too high or low. o Check your blood sugar the morning of your surgery when you wake up and every 2 hours until you get to the Short Stay unit. . If your blood sugar is less than 70 mg/dL, you will need to treat for low blood sugar: o Do not take insulin. o Treat a low blood sugar (less than 70 mg/dL) with  cup of clear juice (cranberry or apple), 4 glucose tablets, OR glucose gel. o Recheck blood sugar in 15 minutes after treatment (to make sure it is greater than 70 mg/dL). If your blood sugar is not greater than 70 mg/dL on recheck, call [phone] for further instructions. . Report your blood sugar to the short stay nurse when you get to Short Stay.  . If you are admitted to the hospital after surgery: o Your blood sugar will be checked by the staff and you will probably be given insulin after surgery (instead of oral diabetes medicines) to make sure you have good blood sugar levels. o The goal for blood sugar control after surgery is 80-180 mg/dL.   Do not wear jewelry  Do not wear lotions, powders, or colognes, or deodorant.  Men may shave face and neck.  Do not bring valuables to the hospital.  [facility] is not responsible for any belongings or valuables.  Contacts, dentures or bridgework may not be worn into surgery.  Leave your suitcase in the car.  After surgery  it may be brought to your room.  For patients admitted to the hospital, discharge time will be determined by your treatment team.  Patients discharged the day of surgery will not be allowed to drive home.   Special instructions:    - Preparing For Surgery  Before surgery, you can play an important role. Because skin is not sterile, your skin needs to be as free of germs as possible. You can reduce the number of germs on your skin by washing with CHG (chlorahexidine gluconate) Soap before surgery.  CHG is an antiseptic cleaner which  kills germs and bonds with the skin to continue killing germs even after washing.    Oral Hygiene is also important to reduce your risk of infection.  Remember - BRUSH YOUR TEETH THE MORNING OF SURGERY WITH YOUR REGULAR TOOTHPASTE  Please do not use if you have an allergy to CHG or antibacterial soaps. If your skin becomes reddened/irritated stop using the CHG.  Do not shave (including legs and underarms) for at least 48 hours prior to first CHG shower. It is OK to shave your face.  Please follow these instructions carefully.   1. Shower the NIGHT BEFORE SURGERY and the MORNING OF SURGERY with CHG.   2. If you chose to wash your hair, wash your hair first as usual with your normal shampoo.  3. After you shampoo, rinse your hair and body thoroughly to remove the shampoo.  4. Use CHG as you would any other liquid soap. You can apply CHG directly to the skin and wash gently with a scrungie or a clean washcloth.   5. Apply the CHG Soap to your body ONLY FROM THE NECK DOWN.  Do not use on open wounds or open sores. Avoid contact with your eyes, ears, mouth and genitals (private parts). Wash Face and genitals (private parts)  with your normal soap.  6. Wash thoroughly, paying special attention to the area where your surgery will be performed.  7. Thoroughly rinse your body with warm water from the neck down.  8. DO NOT shower/wash with your normal soap after using and rinsing off the CHG Soap.  9. Pat yourself dry with a CLEAN TOWEL.  10. Wear CLEAN PAJAMAS to bed the night before surgery, wear comfortable clothes the morning of surgery  11. Place CLEAN SHEETS on your bed the night of your first shower and DO NOT SLEEP WITH PETS.    Day of Surgery:  Do not apply any deodorants/lotions.  Please wear clean clothes to the hospital/surgery center.   Remember to brush your teeth WITH YOUR REGULAR TOOTHPASTE.    Please read over the following fact sheets that you were given. Coughing  and Deep Breathing, MRSA Information and Surgical Site Infection Prevention

## 2018-04-27 ENCOUNTER — Other Ambulatory Visit: Payer: Self-pay | Admitting: Internal Medicine

## 2018-04-27 DIAGNOSIS — I10 Essential (primary) hypertension: Secondary | ICD-10-CM

## 2018-04-27 NOTE — Progress Notes (Signed)
Anesthesia Chart Review:  Case:  [id number] Date/Time:  04/29/18 1115   Procedure:  Left Lumbar 4-5 Laminectomy/Foraminotomy (Left Back) - Left Lumbar 4-5 Laminectomy/Foraminotomy   Anesthesia type:  General   Pre-op diagnosis:  Lumbar radiculopathy   Location:  [facility] OR ROOM 20 / [facility]   Surgeon:  Eustace Moore, MD      DISCUSSION: 67 yo male never smoker for above procedure. Pertinent hx includes HTN, renal insufficiency, Paroxysmal Afib, remote h/o SVT s/p AVNRT ablation, MI (8588), Complication of anesthesia ("woke up during surgery and ablation"), DMII, Gout, Arthritis, HOH, CAD (stent x 2 2009).  Pt was seen by cardiology Dr. Cristopher Peru 04/26/2018 for surgical clearance. Per his note:  1. Preoperative eval - he is low risk for major cardiac complications from his upcoming spinal surgery and may proceed with the surgery. 2. Atrial fib - he is maintaining nSR very nicely. He has held his Pradaxa for the past 3 days.  3. CAD - He is sedentary but he is having a low risk surgery. I have encouraged him to proceed. No change in his meds. 4. HTN - his blood pressure is reasonably well controlled. Hopefully after his surgery, exercise will be possible.  Evidently vitals were not recorded during PAT visit. Vital signs from appointment with Dr. Lovena Le on 04/26/2018: BP 140/80   Pulse 62   Ht 5\' 8"  (1.727 m)   Wt 294 lb (133.4 kg)   BMI 44.70 kg/m   Anticipate he can proceed as scheduled barring acute status change.   VS: There were no vitals taken for this visit.  PROVIDERS: Leanna Battles, MD is PCP  Cristopher Peru, MD is Cardiologist last seen 04/26/2018   LABS: Labs reviewed: Acceptable for surgery. (all labs ordered are listed, but only abnormal results are displayed)  Labs Reviewed  GLUCOSE, CAPILLARY - Abnormal; Notable for the following components:      Result Value   Glucose-Capillary 122 (*)    All other components within normal limits  BASIC METABOLIC PANEL - Abnormal;  Notable for the following components:   Glucose, Bld 132 (*)    BUN 30 (*)    Creatinine, Ser 1.29 (*)    GFR calc non Af Amer 55 (*)    All other components within normal limits  HEMOGLOBIN A1C - Abnormal; Notable for the following components:   Hgb A1c MFr Bld 6.0 (*)    All other components within normal limits  SURGICAL PCR SCREEN  CBC  APTT     IMAGES: CHEST  2 VIEW 06/23/2016:   COMPARISON:  05/02/2010 chest radiograph.  FINDINGS: Stable cardiomediastinal silhouette with normal heart size. No pneumothorax. No pleural effusion. Mild bibasilar atelectasis. No pulmonary edema. No acute consolidative airspace disease.  IMPRESSION: Mild bibasilar atelectasis. Otherwise no active disease in the chest.   EKG: 04/26/2018: Sinus bradycardia (59)  CV: Cath 09/27/2014: ANGIOGRAPHIC DATA:   The left main coronary artery is normal.  The left anterior descending artery is widely patent. The proximal LAD stent is widely patent. The LAD reaches the left ventricular apex. Some diffuse disease is noted in the apical LAD. The first agonal is totally occluded and fills by left to left collaterals. The LAD beyond the proximal stent contains 30-40% narrowing..  The left circumflex artery is is a moderate size vessel giving origin to 3 obtuse marginal branches. The first obtuse marginal is large and contains 50% proximal/ostial narrowing. The circumflex beyond the first obtuse marginal contains 40% narrowing immediately  proximal to the mid circumflex stent which is widely patent. The third obtuse marginal is small and free of any significant obstruction.  The right coronary artery is dominant and contains moderate plaque and ectasia throughout the proximal to mid and distal segment. No obstructive lesions are noted. A large PDA and 3 left ventricular branches are noted.Marland Kitchen  LEFT VENTRICULOGRAM:  Left ventricular angiogram was done in the 30 RAO projection and revealed normal left  ventricular cavity size and contractility with ejection fraction of 60%.   IMPRESSIONS:   1. Widely patent proximal LAD stent with luminal irregularities up to 30-40% in the proximal to mid LAD. The proximal LAD stent is widely patent. A moderate size first diagonal is totally occluded and filled by left to left collaterals. 2. Widely patent circumflex with no evidence of in-stent restenosis in the mid vessel. The first obtuse marginal contains 50% narrowing. The mid circumflex contains 40% narrowing. 3. Widely patent dominant right coronary with marked ectasia/irregularity throughout the proximal mid and distal vessel but with no evidence of obstructive disease. 4. Normal left ventricular function with normal end-diastolic pressure   RECOMMENDATION:  Medical therapy which should include rhythm control. It is likely that poorly controlled ventricular rates with physical activity or contributing to angina. No explanation for the inferobasal perfusion abnormality can be found. The reversible mid anterior abnormality is due to occlusion of the diagonal branch.  Lexiscan 09/25/2014: Impression Exercise Capacity:  Lexiscan with low level exercise. BP Response:  Normal blood pressure response. Clinical Symptoms:  There is dyspnea. ECG Impression:  No significant ST segment change suggestive of ischemia. Comparison with Prior Nuclear Study: No previous nuclear study performed  Overall Impression:  High risk stress nuclear study.  Study is consistent with multivessel coronary disease with medium size reversible defect of moderate intensity of the midanterior and anteroapical wall, and a medium size partially reversible defect of moderate intensity of the inferobasal wall.  LV Ejection Fraction: 47%.  LV Wall Motion:  Mildly depressed LV systolic function without segmental wall motion abnormalities.   Echo 12/22/205 Study Conclusions  - Left ventricle: The cavity size was normal. Wall  thickness was increased in a pattern of mild LVH. Systolic function was normal. The estimated ejection fraction was in the range of 50% to 55%. Wall motion was normal; there were no regional wall motion abnormalities. - Mitral valve: Calcified annulus. There was mild regurgitation. - Left atrium: The atrium was moderately dilated. - Pulmonary arteries: Systolic pressure was moderately increased. PA peak pressure: 50 mm Hg (S).  Impressions:  - Normal LV function; mild LVH; moderate LAE; mild MR and TR; moderately elevated pulmonary pressure.  Past Medical History:  Diagnosis Date  . Arthritis   . Atrial fibrillation ([place])   . Atrial flutter ([place])   . Back pain   . CAD (coronary artery disease)    stents placed 2009  . Complication of anesthesia    woke up during surgery and ablation  . DM (diabetes mellitus) ([place])    type 2  . Elevated PSA   . Gout   . Hearing problem   . HTN (hypertension)   . Hyperlipidemia   . Myocardial infarct, old 2010  . Prostatitis   . Renal insufficiency     Past Surgical History:  Procedure Laterality Date  . CARDIAC CATHETERIZATION  11/07/07   with 2 stents  . COLONOSCOPY    . 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    Marland Kitchen  EXTERNAL EAR SURGERY Right    cartilage  . LEFT AND RIGHT HEART CATHETERIZATION WITH CORONARY/GRAFT ANGIOGRAM N/A 09/27/2014   Procedure: LEFT AND RIGHT HEART CATHETERIZATION WITH Beatrix Fetters;  Surgeon: Sinclair Grooms, MD;  Location: [facility] CATH LAB;  Service: Cardiovascular;  Laterality: N/A;  . TOOTH EXTRACTION    . VASECTOMY      MEDICATIONS: . acetaminophen (TYLENOL) 650 MG CR tablet  . amLODipine (NORVASC) 5 MG tablet  . aspirin EC 81 MG tablet  . atenolol (TENORMIN) 50 MG tablet  . atorvastatin (LIPITOR) 40 MG tablet  . esomeprazole (NEXIUM) 20 MG capsule  . fenofibrate micronized (LOFIBRA) 200 MG capsule  . nitroGLYCERIN (NITROSTAT) 0.4 MG  SL tablet  . pioglitazone (ACTOS) 45 MG tablet  . PRADAXA 150 MG CAPS capsule  . quinapril (ACCUPRIL) 40 MG tablet   No current facility-administered medications for this encounter.     Wynonia Musty [facility] Short Stay Center/Anesthesiology Phone [phone] 04/27/2018 11:26 AM

## 2018-04-28 MED ORDER — DEXTROSE 5 % IV SOLN
3.0000 g | INTRAVENOUS | Status: AC
  Administered 2018-04-29: 3 g via INTRAVENOUS
  Filled 2018-04-28: qty 3

## 2018-04-29 ENCOUNTER — Other Ambulatory Visit: Payer: Self-pay

## 2018-04-29 ENCOUNTER — Encounter (HOSPITAL_COMMUNITY): Payer: Self-pay

## 2018-04-29 ENCOUNTER — Ambulatory Visit (HOSPITAL_COMMUNITY): Payer: MEDICARE | Admitting: Physician Assistant

## 2018-04-29 ENCOUNTER — Inpatient Hospital Stay (HOSPITAL_COMMUNITY)
Admission: RE | Admit: 2018-04-29 | Discharge: 2018-04-29 | DRG: 519 | Disposition: A | Discharge status: Home / Self Care | Payer: MEDICARE | Attending: Neurological Surgery | Admitting: Neurological Surgery

## 2018-04-29 ENCOUNTER — Encounter (HOSPITAL_COMMUNITY)
Admission: RE | Disposition: A | Discharge status: Home / Self Care | Payer: Self-pay | Source: Home / Self Care | Attending: Neurological Surgery

## 2018-04-29 ENCOUNTER — Ambulatory Visit (HOSPITAL_COMMUNITY): Payer: MEDICARE | Admitting: Anesthesiology

## 2018-04-29 ENCOUNTER — Ambulatory Visit (HOSPITAL_COMMUNITY): Payer: MEDICARE

## 2018-04-29 DIAGNOSIS — M109 Gout, unspecified: Secondary | ICD-10-CM | POA: Diagnosis not present

## 2018-04-29 DIAGNOSIS — E785 Hyperlipidemia, unspecified: Secondary | ICD-10-CM | POA: Diagnosis not present

## 2018-04-29 DIAGNOSIS — I10 Essential (primary) hypertension: Secondary | ICD-10-CM | POA: Diagnosis present

## 2018-04-29 DIAGNOSIS — M48061 Spinal stenosis, lumbar region without neurogenic claudication: Principal | ICD-10-CM | POA: Diagnosis present

## 2018-04-29 DIAGNOSIS — I4891 Unspecified atrial fibrillation: Secondary | ICD-10-CM | POA: Diagnosis present

## 2018-04-29 DIAGNOSIS — M549 Dorsalgia, unspecified: Secondary | ICD-10-CM | POA: Diagnosis not present

## 2018-04-29 DIAGNOSIS — M79605 Pain in left leg: Secondary | ICD-10-CM | POA: Diagnosis not present

## 2018-04-29 DIAGNOSIS — Z6841 Body Mass Index (BMI) 40.0 and over, adult: Secondary | ICD-10-CM

## 2018-04-29 DIAGNOSIS — Z79899 Other long term (current) drug therapy: Secondary | ICD-10-CM

## 2018-04-29 DIAGNOSIS — Z955 Presence of coronary angioplasty implant and graft: Secondary | ICD-10-CM

## 2018-04-29 DIAGNOSIS — Z7982 Long term (current) use of aspirin: Secondary | ICD-10-CM

## 2018-04-29 DIAGNOSIS — Z9852 Vasectomy status: Secondary | ICD-10-CM | POA: Diagnosis not present

## 2018-04-29 DIAGNOSIS — E119 Type 2 diabetes mellitus without complications: Secondary | ICD-10-CM | POA: Diagnosis not present

## 2018-04-29 DIAGNOSIS — Z8249 Family history of ischemic heart disease and other diseases of the circulatory system: Secondary | ICD-10-CM

## 2018-04-29 DIAGNOSIS — R972 Elevated prostate specific antigen [PSA]: Secondary | ICD-10-CM | POA: Diagnosis not present

## 2018-04-29 DIAGNOSIS — M7130 Other bursal cyst, unspecified site: Secondary | ICD-10-CM | POA: Diagnosis not present

## 2018-04-29 DIAGNOSIS — I252 Old myocardial infarction: Secondary | ICD-10-CM

## 2018-04-29 DIAGNOSIS — M1990 Unspecified osteoarthritis, unspecified site: Secondary | ICD-10-CM | POA: Diagnosis present

## 2018-04-29 DIAGNOSIS — I2510 Atherosclerotic heart disease of native coronary artery without angina pectoris: Secondary | ICD-10-CM | POA: Diagnosis present

## 2018-04-29 DIAGNOSIS — I4892 Unspecified atrial flutter: Secondary | ICD-10-CM | POA: Diagnosis present

## 2018-04-29 DIAGNOSIS — H9190 Unspecified hearing loss, unspecified ear: Secondary | ICD-10-CM | POA: Diagnosis present

## 2018-04-29 DIAGNOSIS — Z419 Encounter for procedure for purposes other than remedying health state, unspecified: Secondary | ICD-10-CM

## 2018-04-29 DIAGNOSIS — M7138 Other bursal cyst, other site: Secondary | ICD-10-CM | POA: Diagnosis present

## 2018-04-29 DIAGNOSIS — Z820 Family history of epilepsy and other diseases of the nervous system: Secondary | ICD-10-CM

## 2018-04-29 DIAGNOSIS — M5416 Radiculopathy, lumbar region: Secondary | ICD-10-CM | POA: Diagnosis not present

## 2018-04-29 DIAGNOSIS — Z9889 Other specified postprocedural states: Secondary | ICD-10-CM

## 2018-04-29 DIAGNOSIS — Z981 Arthrodesis status: Secondary | ICD-10-CM | POA: Diagnosis not present

## 2018-04-29 HISTORY — PX: LUMBAR LAMINECTOMY/DECOMPRESSION MICRODISCECTOMY: SHX5026

## 2018-04-29 LAB — GLUCOSE, CAPILLARY
GLUCOSE-CAPILLARY: 113 mg/dL — AB (ref 70–99)
Glucose-Capillary: 130 mg/dL — ABNORMAL HIGH (ref 70–99)
Glucose-Capillary: 135 mg/dL — ABNORMAL HIGH (ref 70–99)

## 2018-04-29 LAB — PROTIME-INR
INR: 0.99
Prothrombin Time: 13 seconds (ref 11.4–15.2)

## 2018-04-29 SURGERY — LUMBAR LAMINECTOMY/DECOMPRESSION MICRODISCECTOMY 1 LEVEL
Anesthesia: General | Site: Back | Laterality: Left

## 2018-04-29 MED ORDER — PROMETHAZINE HCL 25 MG/ML IJ SOLN: 6.2500 mg | INTRAMUSCULAR | Status: DC | PRN

## 2018-04-29 MED ORDER — SODIUM CHLORIDE 0.9 % IJ SOLN
INTRAMUSCULAR | Status: AC
  Filled 2018-04-29: qty 10

## 2018-04-29 MED ORDER — HYDROCODONE-ACETAMINOPHEN 5-325 MG PO TABS: 1.0000 | ORAL_TABLET | ORAL | Status: DC | PRN

## 2018-04-29 MED ORDER — SODIUM CHLORIDE 0.9 % IV SOLN: 250.0000 mL | INTRAVENOUS | Status: DC

## 2018-04-29 MED ORDER — DEXAMETHASONE SODIUM PHOSPHATE 10 MG/ML IJ SOLN
INTRAMUSCULAR | Status: AC
  Filled 2018-04-29: qty 1

## 2018-04-29 MED ORDER — ACETAMINOPHEN 650 MG RE SUPP: 650.0000 mg | RECTAL | Status: DC | PRN

## 2018-04-29 MED ORDER — EPHEDRINE SULFATE 50 MG/ML IJ SOLN
INTRAMUSCULAR | Status: DC | PRN
  Administered 2018-04-29: 10 mg via INTRAVENOUS

## 2018-04-29 MED ORDER — METHOCARBAMOL 1000 MG/10ML IJ SOLN
500.0000 mg | Freq: Four times a day (QID) | INTRAVENOUS | Status: DC | PRN
  Filled 2018-04-29: qty 5

## 2018-04-29 MED ORDER — ROCURONIUM BROMIDE 100 MG/10ML IV SOLN
INTRAVENOUS | Status: DC | PRN
  Administered 2018-04-29: 20 mg via INTRAVENOUS
  Administered 2018-04-29: 50 mg via INTRAVENOUS

## 2018-04-29 MED ORDER — PROPOFOL 10 MG/ML IV BOLUS
INTRAVENOUS | Status: DC | PRN
  Administered 2018-04-29: 200 mg via INTRAVENOUS

## 2018-04-29 MED ORDER — MENTHOL 3 MG MT LOZG: 1.0000 | LOZENGE | OROMUCOSAL | Status: DC | PRN

## 2018-04-29 MED ORDER — PHENYLEPHRINE HCL 10 MG/ML IJ SOLN
INTRAMUSCULAR | Status: DC | PRN
  Administered 2018-04-29 (×2): 100 ug via INTRAVENOUS

## 2018-04-29 MED ORDER — HEMOSTATIC AGENTS (NO CHARGE) OPTIME
TOPICAL | Status: DC | PRN
  Administered 2018-04-29: 1 via TOPICAL

## 2018-04-29 MED ORDER — SUFENTANIL CITRATE 50 MCG/ML IV SOLN
INTRAVENOUS | Status: DC | PRN
  Administered 2018-04-29: 15 ug via INTRAVENOUS
  Administered 2018-04-29: 10 ug via INTRAVENOUS

## 2018-04-29 MED ORDER — SUGAMMADEX SODIUM 200 MG/2ML IV SOLN
INTRAVENOUS | Status: DC | PRN
  Administered 2018-04-29: 400 mg via INTRAVENOUS

## 2018-04-29 MED ORDER — LIDOCAINE 2% (20 MG/ML) 5 ML SYRINGE
INTRAMUSCULAR | Status: AC
  Filled 2018-04-29: qty 5

## 2018-04-29 MED ORDER — LISINOPRIL 20 MG PO TABS: 40.0000 mg | ORAL_TABLET | Freq: Every day | ORAL | Status: DC

## 2018-04-29 MED ORDER — CHLORHEXIDINE GLUCONATE CLOTH 2 % EX PADS: 6.0000 | MEDICATED_PAD | Freq: Once | CUTANEOUS | Status: DC

## 2018-04-29 MED ORDER — ONDANSETRON HCL 4 MG/2ML IJ SOLN
4.0000 mg | Freq: Four times a day (QID) | INTRAMUSCULAR | Status: DC | PRN
  Filled 2018-04-29: qty 2

## 2018-04-29 MED ORDER — METHOCARBAMOL 500 MG PO TABS: 500.0000 mg | ORAL_TABLET | Freq: Four times a day (QID) | ORAL | Status: DC | PRN

## 2018-04-29 MED ORDER — PHENOL 1.4 % MT LIQD: 1.0000 | OROMUCOSAL | Status: DC | PRN

## 2018-04-29 MED ORDER — PANTOPRAZOLE SODIUM 40 MG PO TBEC: 40.0000 mg | DELAYED_RELEASE_TABLET | Freq: Every day | ORAL | Status: DC

## 2018-04-29 MED ORDER — ATENOLOL 25 MG PO TABS: 50.0000 mg | ORAL_TABLET | Freq: Two times a day (BID) | ORAL | Status: DC

## 2018-04-29 MED ORDER — SUGAMMADEX SODIUM 500 MG/5ML IV SOLN
INTRAVENOUS | Status: AC
  Filled 2018-04-29: qty 5

## 2018-04-29 MED ORDER — HYDROMORPHONE HCL 1 MG/ML IJ SOLN
INTRAMUSCULAR | Status: AC
  Administered 2018-04-29: 0.5 mg via INTRAVENOUS
  Filled 2018-04-29: qty 1

## 2018-04-29 MED ORDER — ROCURONIUM BROMIDE 10 MG/ML (PF) SYRINGE
PREFILLED_SYRINGE | INTRAVENOUS | Status: AC
  Filled 2018-04-29: qty 20

## 2018-04-29 MED ORDER — BUPIVACAINE HCL (PF) 0.25 % IJ SOLN
INTRAMUSCULAR | Status: AC
  Filled 2018-04-29: qty 30

## 2018-04-29 MED ORDER — ONDANSETRON HCL 4 MG PO TABS: 4.0000 mg | ORAL_TABLET | Freq: Four times a day (QID) | ORAL | Status: DC | PRN

## 2018-04-29 MED ORDER — LACTATED RINGERS IV SOLN
INTRAVENOUS | Status: DC | PRN
  Administered 2018-04-29 (×2): via INTRAVENOUS

## 2018-04-29 MED ORDER — PIOGLITAZONE HCL 45 MG PO TABS
45.0000 mg | ORAL_TABLET | Freq: Every evening | ORAL | Status: DC
  Filled 2018-04-29: qty 1

## 2018-04-29 MED ORDER — 0.9 % SODIUM CHLORIDE (POUR BTL) OPTIME
TOPICAL | Status: DC | PRN
  Administered 2018-04-29: 1000 mL

## 2018-04-29 MED ORDER — SODIUM CHLORIDE 0.9% FLUSH: 3.0000 mL | INTRAVENOUS | Status: DC | PRN

## 2018-04-29 MED ORDER — LIDOCAINE HCL (CARDIAC) PF 100 MG/5ML IV SOSY
PREFILLED_SYRINGE | INTRAVENOUS | Status: DC | PRN
  Administered 2018-04-29: 50 mg via INTRAVENOUS

## 2018-04-29 MED ORDER — SUCCINYLCHOLINE CHLORIDE 200 MG/10ML IV SOSY
PREFILLED_SYRINGE | INTRAVENOUS | Status: AC
  Filled 2018-04-29: qty 10

## 2018-04-29 MED ORDER — SENNA 8.6 MG PO TABS: 1.0000 | ORAL_TABLET | Freq: Two times a day (BID) | ORAL | Status: DC

## 2018-04-29 MED ORDER — EPHEDRINE 5 MG/ML INJ
INTRAVENOUS | Status: AC
  Filled 2018-04-29: qty 10

## 2018-04-29 MED ORDER — POTASSIUM CHLORIDE IN NACL 20-0.9 MEQ/L-% IV SOLN: INTRAVENOUS | Status: DC

## 2018-04-29 MED ORDER — AMLODIPINE BESYLATE 5 MG PO TABS: 5.0000 mg | ORAL_TABLET | Freq: Every day | ORAL | Status: DC

## 2018-04-29 MED ORDER — HYDROMORPHONE HCL 1 MG/ML IJ SOLN
0.2500 mg | INTRAMUSCULAR | Status: DC | PRN
  Administered 2018-04-29: 0.5 mg via INTRAVENOUS

## 2018-04-29 MED ORDER — HYDROCODONE-ACETAMINOPHEN 5-325 MG PO TABS: 1.0000 | ORAL_TABLET | ORAL | 0 refills | Status: DC | PRN

## 2018-04-29 MED ORDER — INSULIN ASPART 100 UNIT/ML ~~LOC~~ SOLN: 0.0000 [IU] | Freq: Three times a day (TID) | SUBCUTANEOUS | Status: DC

## 2018-04-29 MED ORDER — HYDROMORPHONE HCL 1 MG/ML IJ SOLN: 1.0000 mg | INTRAMUSCULAR | Status: DC | PRN

## 2018-04-29 MED ORDER — SODIUM CHLORIDE 0.9 % IV SOLN
INTRAVENOUS | Status: DC | PRN
  Administered 2018-04-29: 14:00:00

## 2018-04-29 MED ORDER — ACETAMINOPHEN 325 MG PO TABS: 650.0000 mg | ORAL_TABLET | ORAL | Status: DC | PRN

## 2018-04-29 MED ORDER — HYDROCODONE-ACETAMINOPHEN 5-325 MG PO TABS
ORAL_TABLET | ORAL | Status: AC
  Administered 2018-04-29: 1
  Filled 2018-04-29: qty 1

## 2018-04-29 MED ORDER — THROMBIN 5000 UNITS EX SOLR
OROMUCOSAL | Status: DC | PRN
  Administered 2018-04-29: 14:00:00 via TOPICAL

## 2018-04-29 MED ORDER — ASPIRIN EC 81 MG PO TBEC: 81.0000 mg | DELAYED_RELEASE_TABLET | Freq: Every day | ORAL | Status: DC

## 2018-04-29 MED ORDER — THROMBIN 5000 UNITS EX SOLR
CUTANEOUS | Status: DC | PRN
  Administered 2018-04-29: 10000 [IU] via TOPICAL

## 2018-04-29 MED ORDER — QUINAPRIL HCL 10 MG PO TABS: 40.0000 mg | ORAL_TABLET | Freq: Every day | ORAL | Status: DC

## 2018-04-29 MED ORDER — SODIUM CHLORIDE 0.9% FLUSH: 3.0000 mL | Freq: Two times a day (BID) | INTRAVENOUS | Status: DC

## 2018-04-29 MED ORDER — ONDANSETRON HCL 4 MG/2ML IJ SOLN
INTRAMUSCULAR | Status: DC | PRN
  Administered 2018-04-29: 4 mg via INTRAVENOUS

## 2018-04-29 MED ORDER — LACTATED RINGERS IV SOLN
INTRAVENOUS | Status: DC
  Administered 2018-04-29: 10 mL/h via INTRAVENOUS

## 2018-04-29 MED ORDER — THROMBIN 5000 UNITS EX SOLR
CUTANEOUS | Status: AC
  Filled 2018-04-29: qty 15000

## 2018-04-29 MED ORDER — KETOROLAC TROMETHAMINE 30 MG/ML IJ SOLN
30.0000 mg | Freq: Once | INTRAMUSCULAR | Status: AC | PRN
  Administered 2018-04-29: 30 mg via INTRAVENOUS

## 2018-04-29 MED ORDER — PHENYLEPHRINE 40 MCG/ML (10ML) SYRINGE FOR IV PUSH (FOR BLOOD PRESSURE SUPPORT)
PREFILLED_SYRINGE | INTRAVENOUS | Status: AC
  Filled 2018-04-29: qty 10

## 2018-04-29 MED ORDER — CEFAZOLIN SODIUM-DEXTROSE 2-4 GM/100ML-% IV SOLN: 2.0000 g | Freq: Three times a day (TID) | INTRAVENOUS | Status: DC

## 2018-04-29 MED ORDER — METHOCARBAMOL 500 MG PO TABS
ORAL_TABLET | ORAL | Status: AC
  Administered 2018-04-29: 500 mg
  Filled 2018-04-29: qty 1

## 2018-04-29 MED ORDER — SUFENTANIL CITRATE 50 MCG/ML IV SOLN
INTRAVENOUS | Status: AC
  Filled 2018-04-29: qty 1

## 2018-04-29 MED ORDER — METHOCARBAMOL 500 MG PO TABS: 500.0000 mg | ORAL_TABLET | Freq: Four times a day (QID) | ORAL | 0 refills | Status: DC | PRN

## 2018-04-29 MED ORDER — ONDANSETRON HCL 4 MG/2ML IJ SOLN
INTRAMUSCULAR | Status: AC
  Filled 2018-04-29: qty 2

## 2018-04-29 MED ORDER — KETOROLAC TROMETHAMINE 30 MG/ML IJ SOLN
INTRAMUSCULAR | Status: AC
  Administered 2018-04-29: 30 mg via INTRAVENOUS
  Filled 2018-04-29: qty 1

## 2018-04-29 SURGICAL SUPPLY — 47 items
BAG DECANTER FOR FLEXI CONT (MISCELLANEOUS) ×2 IMPLANT
BENZOIN TINCTURE PRP APPL 2/3 (GAUZE/BANDAGES/DRESSINGS) ×2 IMPLANT
BUR MATCHSTICK NEURO 3.0 LAGG (BURR) ×2 IMPLANT
CANISTER SUCT 3000ML PPV (MISCELLANEOUS) ×2 IMPLANT
CARTRIDGE OIL MAESTRO DRILL (MISCELLANEOUS) ×1 IMPLANT
DERMABOND ADVANCED (GAUZE/BANDAGES/DRESSINGS) ×1
DERMABOND ADVANCED .7 DNX12 (GAUZE/BANDAGES/DRESSINGS) ×1 IMPLANT
DIFFUSER DRILL AIR PNEUMATIC (MISCELLANEOUS) ×2 IMPLANT
DRAPE LAPAROTOMY 100X72X124 (DRAPES) ×2 IMPLANT
DRAPE MICROSCOPE LEICA (MISCELLANEOUS) IMPLANT
DRAPE POUCH INSTRU U-SHP 10X18 (DRAPES) ×2 IMPLANT
DRAPE SURG 17X23 STRL (DRAPES) ×2 IMPLANT
DRSG OPSITE POSTOP 4X6 (GAUZE/BANDAGES/DRESSINGS) ×2 IMPLANT
DURAPREP 26ML APPLICATOR (WOUND CARE) ×2 IMPLANT
ELECT BLADE 4.0 EZ CLEAN MEGAD (MISCELLANEOUS) ×2
ELECT REM PT RETURN 9FT ADLT (ELECTROSURGICAL) ×2
ELECTRODE BLDE 4.0 EZ CLN MEGD (MISCELLANEOUS) ×1 IMPLANT
ELECTRODE REM PT RTRN 9FT ADLT (ELECTROSURGICAL) ×1 IMPLANT
GAUZE SPONGE 4X4 16PLY XRAY LF (GAUZE/BANDAGES/DRESSINGS) IMPLANT
GLOVE BIO SURGEON STRL SZ7 (GLOVE) IMPLANT
GLOVE BIO SURGEON STRL SZ8 (GLOVE) ×2 IMPLANT
GLOVE BIOGEL PI IND STRL 7.0 (GLOVE) IMPLANT
GLOVE BIOGEL PI INDICATOR 7.0 (GLOVE)
GOWN STRL REUS W/ TWL LRG LVL3 (GOWN DISPOSABLE) IMPLANT
GOWN STRL REUS W/ TWL XL LVL3 (GOWN DISPOSABLE) ×1 IMPLANT
GOWN STRL REUS W/TWL 2XL LVL3 (GOWN DISPOSABLE) IMPLANT
GOWN STRL REUS W/TWL LRG LVL3 (GOWN DISPOSABLE)
GOWN STRL REUS W/TWL XL LVL3 (GOWN DISPOSABLE) ×1
HEMOSTAT POWDER KIT SURGIFOAM (HEMOSTASIS) ×2 IMPLANT
KIT BASIN OR (CUSTOM PROCEDURE TRAY) ×2 IMPLANT
KIT TURNOVER KIT B (KITS) ×2 IMPLANT
NEEDLE HYPO 25X1 1.5 SAFETY (NEEDLE) ×2 IMPLANT
NEEDLE SPNL 20GX3.5 QUINCKE YW (NEEDLE) IMPLANT
NS IRRIG 1000ML POUR BTL (IV SOLUTION) ×2 IMPLANT
OIL CARTRIDGE MAESTRO DRILL (MISCELLANEOUS) ×2
PACK LAMINECTOMY NEURO (CUSTOM PROCEDURE TRAY) ×2 IMPLANT
PAD ARMBOARD 7.5X6 YLW CONV (MISCELLANEOUS) ×6 IMPLANT
RUBBERBAND STERILE (MISCELLANEOUS) IMPLANT
SPONGE SURGIFOAM ABS GEL SZ50 (HEMOSTASIS) ×2 IMPLANT
STRIP CLOSURE SKIN 1/2X4 (GAUZE/BANDAGES/DRESSINGS) ×2 IMPLANT
SUT VIC AB 0 CT1 18XCR BRD8 (SUTURE) ×1 IMPLANT
SUT VIC AB 0 CT1 8-18 (SUTURE) ×1
SUT VIC AB 2-0 CP2 18 (SUTURE) ×2 IMPLANT
SUT VIC AB 3-0 SH 8-18 (SUTURE) ×4 IMPLANT
TOWEL GREEN STERILE (TOWEL DISPOSABLE) ×2 IMPLANT
TOWEL GREEN STERILE FF (TOWEL DISPOSABLE) ×2 IMPLANT
WATER STERILE IRR 1000ML POUR (IV SOLUTION) ×2 IMPLANT

## 2018-04-29 NOTE — H&P (Signed)
Subjective: Patient is a 67 y.o. male admitted for L leg pain. Onset of symptoms was several months ago, gradually worsening since that time.  The pain is rated severe, and is located at the across the lower back and radiates to LLE. The pain is described as aching and occurs all day. The symptoms have been progressive. Symptoms are exacerbated by exercise. MRI or CT showed stenosis   Past Medical History:  Diagnosis Date  . Arthritis   . Atrial fibrillation ([place])   . Atrial flutter ([place])   . Back pain   . CAD (coronary artery disease)    stents placed 2009  . Complication of anesthesia    woke up during surgery and ablation  . DM (diabetes mellitus) ([place])    type 2  . Elevated PSA   . Gout   . Hearing problem   . HTN (hypertension)   . Hyperlipidemia   . Myocardial infarct, old 2010  . Prostatitis   . Renal insufficiency     Past Surgical History:  Procedure Laterality Date  . CARDIAC CATHETERIZATION  11/07/07   with 2 stents  . COLONOSCOPY    . 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    Marland Kitchen EXTERNAL EAR SURGERY Right    cartilage  . LEFT AND RIGHT HEART CATHETERIZATION WITH CORONARY/GRAFT ANGIOGRAM N/A 09/27/2014   Procedure: LEFT AND RIGHT HEART CATHETERIZATION WITH Beatrix Fetters;  Surgeon: Sinclair Grooms, MD;  Location: [facility] CATH LAB;  Service: Cardiovascular;  Laterality: N/A;  . TOOTH EXTRACTION    . VASECTOMY      Prior to Admission medications   Medication Sig Start Date End Date Taking? Authorizing Provider  acetaminophen (TYLENOL) 650 MG CR tablet Take 1,300 mg by mouth 2 (two) times daily.   Yes [provider]  amLODipine (NORVASC) 5 MG tablet TAKE 1 TABLET BY MOUTH EVERY DAY 04/27/18  Yes Evans Lance, MD  aspirin EC 81 MG tablet Take 81 mg by mouth daily.   Yes [provider]  atenolol (TENORMIN) 50 MG tablet Take 1 tablet (50 mg total) by mouth 2 (two) times daily. Please keep  upcoming appt in August for future refills. Thank you 04/06/18  Yes Evans Lance, MD  atorvastatin (LIPITOR) 40 MG tablet Take 1 tablet (40 mg total) by mouth daily. Please keep upcoming appt in August for future refills. Thank you Patient taking differently: Take 40 mg by mouth every evening. Please keep upcoming appt in August for future refills. Thank you 04/06/18  Yes Evans Lance, MD  esomeprazole (NEXIUM) 20 MG capsule Take 20 mg by mouth every evening.   Yes [provider]  fenofibrate micronized (LOFIBRA) 200 MG capsule Take 200 mg by mouth at bedtime.    Yes [provider]  pioglitazone (ACTOS) 45 MG tablet Take 45 mg by mouth every evening.    Yes [provider]  PRADAXA 150 MG CAPS capsule TAKE 1 CAPSULE BY MOUTH TWICE A DAY 03/29/18  Yes Evans Lance, MD  quinapril (ACCUPRIL) 40 MG tablet Take 1 tablet (40 mg total) by mouth daily. Please keep upcoming appt in August for future refills. Thank you Patient taking differently: Take 40 mg by mouth at bedtime. Please keep upcoming appt in August for future refills. Thank you 04/06/18  Yes Evans Lance, MD  nitroGLYCERIN (NITROSTAT) 0.4 MG SL tablet Place 0.4 mg under the tongue every 5 (five) minutes x 3  doses as needed for chest pain.    [provider]   Allergies  Allergen Reactions  . Desipramine Hives and Itching  . Niacin Itching    Social History   Tobacco Use  . Smoking status: Never Smoker  . Smokeless tobacco: Former Systems developer    Types: Chew  Substance Use Topics  . Alcohol use: No    Family History  Problem Relation Age of Onset  . Hypertension Mother   . Gout Mother   . Alzheimer's disease Mother   . Heart failure Father   . Heart disease Brother   . Cancer Brother        type unknown     Review of Systems  Positive ROS: neg  All other systems have been reviewed and were otherwise negative with the exception of those mentioned in the HPI and as  above.  Objective: Vital signs in last 24 hours: Temp:  [97.8 F (36.6 C)] 97.8 F (36.6 C) (08/02 0928) Pulse Rate:  [65] 65 (08/02 1014) Resp:  [20] 20 (08/02 0928) BP: (167)/(62) 167/62 (08/02 0928) SpO2:  [98 %] 98 % (08/02 0928) Weight:  [133.4 kg (294 lb)] 133.4 kg (294 lb) (08/02 0955)  General Appearance: Alert, cooperative, no distress, appears stated age Head: Normocephalic, without obvious abnormality, atraumatic Eyes: PERRL, conjunctiva/corneas clear, EOM's intact    Neck: Supple, symmetrical, trachea midline Back: Symmetric, no curvature, ROM normal, no CVA tenderness Lungs:  respirations unlabored Heart: Regular rate and rhythm Abdomen: Soft, non-tender Extremities: Extremities normal, atraumatic, no cyanosis or edema Pulses: 2+ and symmetric all extremities Skin: Skin color, texture, turgor normal, no rashes or lesions  NEUROLOGIC:   Mental status: Alert and oriented x4,  no aphasia, good attention span, fund of knowledge, and memory Motor Exam - grossly normal Sensory Exam - grossly normal Reflexes: 1+ Coordination - grossly normal Gait - grossly normal Balance - grossly normal Cranial Nerves: I: smell Not tested  II: visual acuity  OS: nl    OD: nl  II: visual fields Full to confrontation  II: pupils Equal, round, reactive to light  III,VII: ptosis None  III,IV,VI: extraocular muscles  Full ROM  V: mastication Normal  V: facial light touch sensation  Normal  V,VII: corneal reflex  Present  VII: facial muscle function - upper  Normal  VII: facial muscle function - lower Normal  VIII: hearing Not tested  IX: soft palate elevation  Normal  IX,X: gag reflex Present  XI: trapezius strength  5/5  XI: sternocleidomastoid strength 5/5  XI: neck flexion strength  5/5  XII: tongue strength  Normal    Data Review Lab Results  Component Value Date   WBC 5.4 04/26/2018   HGB 15.0 04/26/2018   HCT 48.6 04/26/2018   MCV 98.0 04/26/2018   PLT 218  04/26/2018   Lab Results  Component Value Date   NA 142 04/26/2018   K 4.3 04/26/2018   CL 108 04/26/2018   CO2 24 04/26/2018   BUN 30 (H) 04/26/2018   CREATININE 1.29 (H) 04/26/2018   GLUCOSE 132 (H) 04/26/2018   Lab Results  Component Value Date   INR 0.99 04/29/2018    Assessment/Plan:  Estimated body mass index is 44.7 kg/m as calculated from the following:   Height as of this encounter: 5\' 8"  (1.727 m).   Weight as of this encounter: 133.4 kg (294 lb). Patient admitted for L L4-5 LL. Patient has failed a reasonable attempt at conservative therapy.  I  explained the condition and procedure to the patient and answered any questions.  Patient wishes to proceed with procedure as planned. Understands risks/ benefits and typical outcomes of procedure.   Yenesis Even S 04/29/2018 12:50 PM

## 2018-04-29 NOTE — Anesthesia Preprocedure Evaluation (Signed)
Anesthesia Evaluation  Patient identified by MRN, date of birth, ID band Patient awake    Reviewed: Allergy & Precautions, NPO status , Patient's Chart, lab work & pertinent test results  Airway Mallampati: II  TM Distance: >3 FB Neck ROM: Full    Dental no notable dental hx.    Pulmonary neg pulmonary ROS,    Pulmonary exam normal breath sounds clear to auscultation       Cardiovascular hypertension, + CAD, + Past MI and + Cardiac Stents  Normal cardiovascular exam+ dysrhythmias Atrial Fibrillation  Rhythm:Regular Rate:Normal     Neuro/Psych negative neurological ROS  negative psych ROS   GI/Hepatic negative GI ROS, Neg liver ROS,   Endo/Other  diabetes  Renal/GU negative Renal ROS  negative genitourinary   Musculoskeletal negative musculoskeletal ROS (+)   Abdominal   Peds negative pediatric ROS (+)  Hematology negative hematology ROS (+)   Anesthesia Other Findings   Reproductive/Obstetrics negative OB ROS                             Anesthesia Physical Anesthesia Plan  ASA: III  Anesthesia Plan: General   Post-op Pain Management:    Induction: Intravenous  PONV Risk Score and Plan: 2 and Ondansetron, Dexamethasone and Treatment may vary due to age or medical condition  Airway Management Planned: Oral ETT  Additional Equipment:   Intra-op Plan:   Post-operative Plan: Extubation in OR  Informed Consent: I have reviewed the patients History and Physical, chart, labs and discussed the procedure including the risks, benefits and alternatives for the proposed anesthesia with the patient or authorized representative who has indicated his/her understanding and acceptance.   Dental advisory given  Plan Discussed with: CRNA and Surgeon  Anesthesia Plan Comments:         Anesthesia Quick Evaluation

## 2018-04-29 NOTE — Transfer of Care (Signed)
Immediate Anesthesia Transfer of Care Note  Patient: Joshua [name]  Procedure(s) Performed: Left Lumbar Four-Five Laminectomy/Foraminotomy (Left Back)  Patient Location: PACU  Anesthesia Type:General  Level of Consciousness: awake, alert  and oriented  Airway & Oxygen Therapy: Patient Spontanous Breathing and Patient connected to nasal cannula oxygen  Post-op Assessment: Report given to RN  Post vital signs: Reviewed and stable  Last Vitals:  Vitals Value Taken Time  BP 131/67 04/29/2018  3:08 PM  Temp    Pulse 66 04/29/2018  3:15 PM  Resp 13 04/29/2018  3:15 PM  SpO2 100 % 04/29/2018  3:15 PM  Vitals shown include unvalidated device data.  Last Pain:  Vitals:   04/29/18 0955  TempSrc:   PainSc: 0-No pain      Patients Stated Pain Goal: 2 (97/58/83 2549)  Complications: No apparent anesthesia complications

## 2018-04-29 NOTE — Discharge Summary (Signed)
Physician Discharge Summary  Patient ID: Joshua [name] MRN: [id number] DOB/AGE: Dec 10, 1950 67 y.o.  Admit date: 04/29/2018 Discharge date: 04/29/2018  Admission Diagnoses: L4-5 spinal stenosis with large left-sided synovial cyst, back and left leg pain   Discharge Diagnoses: same   Discharged Condition: good  Hospital Course: The patient was admitted on 04/29/2018 and taken to the operating room where the patient underwent lumbar laminectomy L4-5 with resection of synovial cyst. The patient tolerated the procedure well and was taken to the recovery room and then to the floor in stable condition. The hospital course was routine. There were no complications. The wound remained clean dry and intact. Pt had appropriate back soreness. No complaints of leg pain or new N/T/W. The patient remained afebrile with stable vital signs, and tolerated a regular diet. The patient continued to increase activities, and pain was well controlled with oral pain medications.   Consults: None  Significant Diagnostic Studies:  Results for orders placed or performed during the hospital encounter of 04/29/18  Glucose, capillary  Result Value Ref Range   Glucose-Capillary 130 (H) 70 - 99 mg/dL  Protime-INR  Result Value Ref Range   Prothrombin Time 13.0 11.4 - 15.2 seconds   INR 0.99   Glucose, capillary  Result Value Ref Range   Glucose-Capillary 113 (H) 70 - 99 mg/dL    Dg Lumbar Spine 2-3 Views  Result Date: 04/29/2018 CLINICAL DATA:  67 year old male undergoing lumbar surgery. EXAM: LUMBAR SPINE - 2-3 VIEW COMPARISON:  CT lumbar myelogram 02/07/2018. FINDINGS: Two Intraoperative portable cross-table lateral views of the lumbar spine. Normal lumbar segmentation demonstrated on the comparison. Image #1 at 1351 hours. A needle is directed toward the L5 pedicle level. Image #2 at 1400 hours. A surgical probe is directed toward the L4-L5 disc space. IMPRESSION: Intraoperative localization at L4-L5 on image #2.  Electronically Signed   By: Genevie Ann M.D.   On: 04/29/2018 15:31    Antibiotics:  Anti-infectives (From admission, onward)   Start     Dose/Rate Route Frequency Ordered Stop   04/29/18 2200  ceFAZolin (ANCEF) IVPB 2g/100 mL premix     2 g 200 mL/hr over 30 Minutes Intravenous Every 8 hours 04/29/18 1720 04/30/18 1359   04/29/18 1410  bacitracin 50,000 Units in sodium chloride 0.9 % 500 mL irrigation  Status:  Discontinued       As needed 04/29/18 1410 04/29/18 1501   04/29/18 1030  ceFAZolin (ANCEF) 3 g in dextrose 5 % 50 mL IVPB     3 g 100 mL/hr over 30 Minutes Intravenous To [facility] 04/28/18 0725 04/29/18 1340      Discharge Exam: Blood pressure (!) 168/68, pulse 60, temperature 97.7 F (36.5 C), temperature source Oral, resp. rate 17, height 5\' 8"  (1.727 m), weight 133.4 kg (294 lb), SpO2 96 %. Neurologic: Grossly normal Ambulating and voiding well  Discharge Medications:   Allergies as of 04/29/2018      Reactions   Desipramine Hives, Itching   Niacin Itching      Medication List    TAKE these medications   acetaminophen 650 MG CR tablet Commonly known as:  TYLENOL Take 1,300 mg by mouth 2 (two) times daily.   amLODipine 5 MG tablet Commonly known as:  NORVASC TAKE 1 TABLET BY MOUTH EVERY DAY   aspirin EC 81 MG tablet Take 81 mg by mouth daily.   atenolol 50 MG tablet Commonly known as:  TENORMIN Take 1 tablet (50 mg total) by  mouth 2 (two) times daily. Please keep upcoming appt in August for future refills. Thank you   atorvastatin 40 MG tablet Commonly known as:  LIPITOR Take 1 tablet (40 mg total) by mouth daily. Please keep upcoming appt in August for future refills. Thank you What changed:    when to take this  additional instructions   esomeprazole 20 MG capsule Commonly known as:  NEXIUM Take 20 mg by mouth every evening.   fenofibrate micronized 200 MG capsule Commonly known as:  LOFIBRA Take 200 mg by mouth at bedtime.    HYDROcodone-acetaminophen 5-325 MG tablet Commonly known as:  NORCO/VICODIN Take 1 tablet by mouth every 4 (four) hours as needed for moderate pain ((score 4 to 6)).   methocarbamol 500 MG tablet Commonly known as:  ROBAXIN Take 1 tablet (500 mg total) by mouth every 6 (six) hours as needed for muscle spasms.   nitroGLYCERIN 0.4 MG SL tablet Commonly known as:  NITROSTAT Place 0.4 mg under the tongue every 5 (five) minutes x 3 doses as needed for chest pain.   pioglitazone 45 MG tablet Commonly known as:  ACTOS Take 45 mg by mouth every evening.   PRADAXA 150 MG Caps capsule Generic drug:  dabigatran TAKE 1 CAPSULE BY MOUTH TWICE A DAY   quinapril 40 MG tablet Commonly known as:  ACCUPRIL Take 1 tablet (40 mg total) by mouth daily. Please keep upcoming appt in August for future refills. Thank you What changed:    when to take this  additional instructions            Durable Medical Equipment  (From admission, onward)        Start     Ordered   04/29/18 1721  DME Walker rolling  Once    Question:  Patient needs a walker to treat with the following condition  Answer:  S/P lumbar fusion   04/29/18 1720   04/29/18 1721  DME 3 n 1  Once     04/29/18 1720      Disposition: home   Final Dx:  l4-5 lami and resection synovial cyst  Discharge Instructions     Remove dressing in 72 hours   Complete by:  As directed    Call MD for:  difficulty breathing, headache or visual disturbances   Complete by:  As directed    Call MD for:  hives   Complete by:  As directed    Call MD for:  persistant dizziness or light-headedness   Complete by:  As directed    Call MD for:  persistant nausea and vomiting   Complete by:  As directed    Call MD for:  redness, tenderness, or signs of infection (pain, swelling, redness, odor or green/yellow discharge around incision site)   Complete by:  As directed    Call MD for:  severe uncontrolled pain   Complete by:  As directed     Call MD for:  temperature >100.4   Complete by:  As directed    Diet - low sodium heart healthy   Complete by:  As directed    Driving Restrictions   Complete by:  As directed    No driving 1 week   Increase activity slowly   Complete by:  As directed    Lifting restrictions   Complete by:  As directed    Nothing heavier than 8 lbs      Follow-up Information    Eustace Moore, MD.  Schedule an appointment as soon as possible for a visit in 2 week(s).   Specialty:  Neurosurgery Contact information: [address] [place] [address] [place] [address] [phone]            Signed: Ocie [facility] 04/29/2018, 5:33 PM

## 2018-04-29 NOTE — Op Note (Signed)
04/29/2018  2:45 PM  PATIENT:  Joshua [name]  67 y.o. male  PRE-OPERATIVE DIAGNOSIS:  L4-5 spinal stenosis with large left-sided synovial cyst, back and left leg pain  POST-OPERATIVE DIAGNOSIS:  same  PROCEDURE:  Decompressive lumbar laminectomy and facetectomy and foraminotomy L4-5 with resection of left-sided synovial cyst  SURGEON:  Sherley Bounds, MD  ASSISTANTS: Dr. Ellene Route  ANESTHESIA:   General  EBL: less than 50 ml  No intake/output data recorded.  BLOOD ADMINISTERED: none  DRAINS: none  SPECIMEN:  none  INDICATION FOR PROCEDURE: This patient presented with severe back and left leg pain. Imaging showed final stenosis L4-5 with a left-sided synovial cyst. The patient tried conservative measures without relief. Pain was debilitating. Recommended decompressive laminectomy with resection of synovial cyst. Patient understood the risks, benefits, and alternatives and potential outcomes and wished to proceed.  PROCEDURE DETAILS: The patient was taken to the operating room and after induction of adequate generalized endotracheal anesthesia, the patient was rolled into the prone position on the Wilson frame and all pressure points were padded. The lumbar region was cleaned and then prepped with DuraPrep and draped in the usual sterile fashion. 5 cc of local anesthesia was injected and then a dorsal midline incision was made and carried down to the lumbo sacral fascia. The fascia was opened and the paraspinous musculature was taken down in a subperiosteal fashion to expose L4-5 on the left. Intraoperative x-ray confirmed my level, and then I used a combination of the high-speed drill and the Kerrison punches to perform a laminectomy, medial facetectomy, and foraminotomy at L4-5 bilaterally. There was a large synovial cyst encountered on the left-hand side. The yellow ligament and cyst were adherent to the dura. I spent time using a Penfield 4 dissecting the cyst in the ligament away from the  dura. Once this was done I could work between the dura and the cyst and remove the cyst. The underlying yellow ligament was opened and removed in a piecemeal fashion to expose the underlying dura and exiting nerve root. I undercut the lateral recess and dissected down until I was medial to and distal to the pedicle. The nerve root was well decompressed. I did retract the dura and coagulate the epidural venous vasculature and found no disc herniation. I then palpated with a coronary dilator along the nerve root and into the foramen to assure adequate decompression. I felt no more compression of the nerve root. I irrigated with saline solution containing bacitracin. Achieved hemostasis with bipolar cautery, lined the dura with Gelfoam, and then closed the fascia with 0 Vicryl. I closed the subcutaneous tissues with 2-0 Vicryl and the subcuticular tissues with 3-0 Vicryl. The skin was then closed with benzoin and Steri-Strips. The drapes were removed, a sterile dressing was applied. The patient was awakened from general anesthesia and transferred to the recovery room in stable condition. At the end of the procedure all sponge, needle and instrument counts were correct.    PLAN OF CARE: Admit for overnight observation  PATIENT DISPOSITION:  PACU - hemodynamically stable.   Delay start of Pharmacological VTE agent (>24hrs) due to surgical blood loss or risk of bleeding:  yes

## 2018-04-29 NOTE — Anesthesia Postprocedure Evaluation (Signed)
Anesthesia Post Note  Patient: Joshua [name]  Procedure(s) Performed: Left Lumbar Four-Five Laminectomy/Foraminotomy (Left Back)     Patient location during evaluation: PACU Anesthesia Type: General Level of consciousness: awake and alert Pain management: pain level controlled Vital Signs Assessment: post-procedure vital signs reviewed and stable Respiratory status: spontaneous breathing, nonlabored ventilation, respiratory function stable and patient connected to nasal cannula oxygen Cardiovascular status: blood pressure returned to baseline and stable Postop Assessment: no apparent nausea or vomiting Anesthetic complications: no    Last Vitals:  Vitals:   04/29/18 1520 04/29/18 1620  BP: 129/80 (!) 124/96  Pulse: 60 62  Resp: 15 17  Temp:    SpO2: 100% 99%    Last Pain:  Vitals:   04/29/18 1635  TempSrc:   PainSc: 4                  [name] S

## 2018-04-29 NOTE — Progress Notes (Signed)
Patient alert and oriented, mae's well, voiding adequate amount of urine, swallowing without difficulty, no c/o pain at time of discharge. Patient discharged home with family. Script and discharged instructions given to patient. Patient and family stated understanding of instructions given. Patient has an appointment with Dr. Jones °

## 2018-04-30 ENCOUNTER — Encounter (HOSPITAL_COMMUNITY): Payer: Self-pay | Admitting: Neurological Surgery

## 2018-05-20 ENCOUNTER — Ambulatory Visit: Payer: Medicare HMO | Admitting: Internal Medicine

## 2018-05-26 DIAGNOSIS — M2560 Stiffness of unspecified joint, not elsewhere classified: Secondary | ICD-10-CM | POA: Diagnosis not present

## 2018-05-26 DIAGNOSIS — M6281 Muscle weakness (generalized): Secondary | ICD-10-CM | POA: Diagnosis not present

## 2018-05-26 DIAGNOSIS — M545 Low back pain: Secondary | ICD-10-CM | POA: Diagnosis not present

## 2018-06-01 DIAGNOSIS — M2560 Stiffness of unspecified joint, not elsewhere classified: Secondary | ICD-10-CM | POA: Diagnosis not present

## 2018-06-01 DIAGNOSIS — M545 Low back pain: Secondary | ICD-10-CM | POA: Diagnosis not present

## 2018-06-01 DIAGNOSIS — M6281 Muscle weakness (generalized): Secondary | ICD-10-CM | POA: Diagnosis not present

## 2018-06-03 DIAGNOSIS — M545 Low back pain: Secondary | ICD-10-CM | POA: Diagnosis not present

## 2018-06-03 DIAGNOSIS — M2560 Stiffness of unspecified joint, not elsewhere classified: Secondary | ICD-10-CM | POA: Diagnosis not present

## 2018-06-03 DIAGNOSIS — M6281 Muscle weakness (generalized): Secondary | ICD-10-CM | POA: Diagnosis not present

## 2018-06-06 DIAGNOSIS — M545 Low back pain: Secondary | ICD-10-CM | POA: Diagnosis not present

## 2018-06-06 DIAGNOSIS — M6281 Muscle weakness (generalized): Secondary | ICD-10-CM | POA: Diagnosis not present

## 2018-06-06 DIAGNOSIS — M2560 Stiffness of unspecified joint, not elsewhere classified: Secondary | ICD-10-CM | POA: Diagnosis not present

## 2018-06-08 DIAGNOSIS — M2560 Stiffness of unspecified joint, not elsewhere classified: Secondary | ICD-10-CM | POA: Diagnosis not present

## 2018-06-08 DIAGNOSIS — M545 Low back pain: Secondary | ICD-10-CM | POA: Diagnosis not present

## 2018-06-08 DIAGNOSIS — M6281 Muscle weakness (generalized): Secondary | ICD-10-CM | POA: Diagnosis not present

## 2018-06-13 DIAGNOSIS — M545 Low back pain: Secondary | ICD-10-CM | POA: Diagnosis not present

## 2018-06-13 DIAGNOSIS — M2560 Stiffness of unspecified joint, not elsewhere classified: Secondary | ICD-10-CM | POA: Diagnosis not present

## 2018-06-13 DIAGNOSIS — M6281 Muscle weakness (generalized): Secondary | ICD-10-CM | POA: Diagnosis not present

## 2018-06-15 DIAGNOSIS — M545 Low back pain: Secondary | ICD-10-CM | POA: Diagnosis not present

## 2018-06-15 DIAGNOSIS — M2560 Stiffness of unspecified joint, not elsewhere classified: Secondary | ICD-10-CM | POA: Diagnosis not present

## 2018-06-15 DIAGNOSIS — M6281 Muscle weakness (generalized): Secondary | ICD-10-CM | POA: Diagnosis not present

## 2018-06-20 DIAGNOSIS — M5416 Radiculopathy, lumbar region: Secondary | ICD-10-CM | POA: Diagnosis not present

## 2018-06-30 ENCOUNTER — Other Ambulatory Visit: Payer: Self-pay | Admitting: Internal Medicine

## 2018-06-30 DIAGNOSIS — I10 Essential (primary) hypertension: Secondary | ICD-10-CM

## 2018-06-30 DIAGNOSIS — Z23 Encounter for immunization: Secondary | ICD-10-CM | POA: Diagnosis not present

## 2018-06-30 DIAGNOSIS — E1129 Type 2 diabetes mellitus with other diabetic kidney complication: Secondary | ICD-10-CM | POA: Diagnosis not present

## 2018-06-30 DIAGNOSIS — Z6841 Body Mass Index (BMI) 40.0 and over, adult: Secondary | ICD-10-CM | POA: Diagnosis not present

## 2018-06-30 DIAGNOSIS — I480 Paroxysmal atrial fibrillation: Secondary | ICD-10-CM | POA: Diagnosis not present

## 2018-06-30 DIAGNOSIS — R600 Localized edema: Secondary | ICD-10-CM | POA: Diagnosis not present

## 2018-06-30 DIAGNOSIS — G4709 Other insomnia: Secondary | ICD-10-CM | POA: Diagnosis not present

## 2018-07-02 ENCOUNTER — Other Ambulatory Visit: Payer: Self-pay | Admitting: Internal Medicine

## 2018-07-02 DIAGNOSIS — I10 Essential (primary) hypertension: Secondary | ICD-10-CM

## 2018-07-03 ENCOUNTER — Other Ambulatory Visit: Payer: Self-pay | Admitting: Internal Medicine

## 2018-07-03 DIAGNOSIS — I2510 Atherosclerotic heart disease of native coronary artery without angina pectoris: Secondary | ICD-10-CM

## 2018-09-07 DIAGNOSIS — H0102A Squamous blepharitis right eye, upper and lower eyelids: Secondary | ICD-10-CM | POA: Diagnosis not present

## 2018-09-07 DIAGNOSIS — E119 Type 2 diabetes mellitus without complications: Secondary | ICD-10-CM | POA: Diagnosis not present

## 2018-09-07 DIAGNOSIS — H0102B Squamous blepharitis left eye, upper and lower eyelids: Secondary | ICD-10-CM | POA: Diagnosis not present

## 2018-09-07 DIAGNOSIS — H2513 Age-related nuclear cataract, bilateral: Secondary | ICD-10-CM | POA: Diagnosis not present

## 2018-09-07 DIAGNOSIS — Z7984 Long term (current) use of oral hypoglycemic drugs: Secondary | ICD-10-CM | POA: Diagnosis not present

## 2018-09-07 DIAGNOSIS — H18413 Arcus senilis, bilateral: Secondary | ICD-10-CM | POA: Diagnosis not present

## 2018-09-07 DIAGNOSIS — D3131 Benign neoplasm of right choroid: Secondary | ICD-10-CM | POA: Diagnosis not present

## 2018-09-07 DIAGNOSIS — H16223 Keratoconjunctivitis sicca, not specified as Sjogren's, bilateral: Secondary | ICD-10-CM | POA: Diagnosis not present

## 2018-09-07 DIAGNOSIS — H25013 Cortical age-related cataract, bilateral: Secondary | ICD-10-CM | POA: Diagnosis not present

## 2018-09-07 DIAGNOSIS — H11153 Pinguecula, bilateral: Secondary | ICD-10-CM | POA: Diagnosis not present

## 2018-09-07 LAB — HM DIABETES EYE EXAM

## 2018-10-21 ENCOUNTER — Other Ambulatory Visit: Payer: Self-pay | Admitting: Internal Medicine

## 2018-10-21 DIAGNOSIS — I480 Paroxysmal atrial fibrillation: Secondary | ICD-10-CM

## 2018-11-02 DIAGNOSIS — R972 Elevated prostate specific antigen [PSA]: Secondary | ICD-10-CM | POA: Diagnosis not present

## 2018-11-09 DIAGNOSIS — R350 Frequency of micturition: Secondary | ICD-10-CM | POA: Diagnosis not present

## 2018-11-09 DIAGNOSIS — R972 Elevated prostate specific antigen [PSA]: Secondary | ICD-10-CM | POA: Diagnosis not present

## 2019-03-13 ENCOUNTER — Encounter: Payer: Self-pay | Admitting: Internal Medicine

## 2019-03-13 DIAGNOSIS — Z125 Encounter for screening for malignant neoplasm of prostate: Secondary | ICD-10-CM | POA: Diagnosis not present

## 2019-03-13 DIAGNOSIS — E7849 Other hyperlipidemia: Secondary | ICD-10-CM | POA: Diagnosis not present

## 2019-03-13 DIAGNOSIS — E1129 Type 2 diabetes mellitus with other diabetic kidney complication: Secondary | ICD-10-CM | POA: Diagnosis not present

## 2019-03-13 DIAGNOSIS — I10 Essential (primary) hypertension: Secondary | ICD-10-CM | POA: Diagnosis not present

## 2019-03-13 DIAGNOSIS — R82998 Other abnormal findings in urine: Secondary | ICD-10-CM | POA: Diagnosis not present

## 2019-03-13 DIAGNOSIS — M109 Gout, unspecified: Secondary | ICD-10-CM | POA: Diagnosis not present

## 2019-03-21 ENCOUNTER — Other Ambulatory Visit: Payer: Self-pay | Admitting: Internal Medicine

## 2019-03-21 DIAGNOSIS — M109 Gout, unspecified: Secondary | ICD-10-CM | POA: Diagnosis not present

## 2019-03-21 DIAGNOSIS — I1310 Hypertensive heart and chronic kidney disease without heart failure, with stage 1 through stage 4 chronic kidney disease, or unspecified chronic kidney disease: Secondary | ICD-10-CM | POA: Diagnosis not present

## 2019-03-21 DIAGNOSIS — Z0000 Encounter for general adult medical examination without abnormal findings: Secondary | ICD-10-CM | POA: Diagnosis not present

## 2019-03-21 DIAGNOSIS — G4700 Insomnia, unspecified: Secondary | ICD-10-CM | POA: Diagnosis not present

## 2019-03-21 DIAGNOSIS — E1129 Type 2 diabetes mellitus with other diabetic kidney complication: Secondary | ICD-10-CM | POA: Diagnosis not present

## 2019-03-21 DIAGNOSIS — I480 Paroxysmal atrial fibrillation: Secondary | ICD-10-CM | POA: Diagnosis not present

## 2019-03-21 DIAGNOSIS — I2510 Atherosclerotic heart disease of native coronary artery without angina pectoris: Secondary | ICD-10-CM | POA: Diagnosis not present

## 2019-03-21 DIAGNOSIS — I10 Essential (primary) hypertension: Secondary | ICD-10-CM

## 2019-03-21 DIAGNOSIS — E785 Hyperlipidemia, unspecified: Secondary | ICD-10-CM | POA: Diagnosis not present

## 2019-03-21 DIAGNOSIS — E1121 Type 2 diabetes mellitus with diabetic nephropathy: Secondary | ICD-10-CM | POA: Diagnosis not present

## 2019-04-09 ENCOUNTER — Other Ambulatory Visit: Payer: Self-pay | Admitting: Internal Medicine

## 2019-04-09 DIAGNOSIS — I2510 Atherosclerotic heart disease of native coronary artery without angina pectoris: Secondary | ICD-10-CM

## 2019-04-26 ENCOUNTER — Other Ambulatory Visit: Payer: Self-pay | Admitting: Internal Medicine

## 2019-04-26 DIAGNOSIS — I10 Essential (primary) hypertension: Secondary | ICD-10-CM

## 2019-04-26 MED ORDER — AMLODIPINE BESYLATE 5 MG PO TABS: 5.0000 mg | ORAL_TABLET | Freq: Every day | ORAL | 0 refills | Status: DC

## 2019-05-21 DIAGNOSIS — Z1339 Encounter for screening examination for other mental health and behavioral disorders: Secondary | ICD-10-CM | POA: Diagnosis not present

## 2019-05-21 DIAGNOSIS — Z1331 Encounter for screening for depression: Secondary | ICD-10-CM | POA: Diagnosis not present

## 2019-06-14 ENCOUNTER — Other Ambulatory Visit: Payer: Self-pay | Admitting: Internal Medicine

## 2019-06-14 DIAGNOSIS — I10 Essential (primary) hypertension: Secondary | ICD-10-CM

## 2019-06-16 DIAGNOSIS — R69 Illness, unspecified: Secondary | ICD-10-CM | POA: Diagnosis not present

## 2019-06-26 ENCOUNTER — Other Ambulatory Visit: Payer: Self-pay | Admitting: Internal Medicine

## 2019-06-26 ENCOUNTER — Encounter: Payer: Self-pay | Admitting: Internal Medicine

## 2019-06-26 ENCOUNTER — Telehealth (INDEPENDENT_AMBULATORY_CARE_PROVIDER_SITE_OTHER): Payer: Medicare HMO | Admitting: Internal Medicine

## 2019-06-26 ENCOUNTER — Other Ambulatory Visit: Payer: Self-pay

## 2019-06-26 VITALS — BP 126/73 | HR 59 | Ht 68.0 in | Wt 290.0 lb

## 2019-06-26 DIAGNOSIS — I480 Paroxysmal atrial fibrillation: Secondary | ICD-10-CM

## 2019-06-26 DIAGNOSIS — I10 Essential (primary) hypertension: Secondary | ICD-10-CM | POA: Diagnosis not present

## 2019-06-26 NOTE — Telephone Encounter (Signed)
Pt has a telemedicine visit today 06/26/19 with Dr Rise Mu, last labs 04/26/18 Creat 1.29, age 68, weight 131.5kg, CrCl 99.11, based on CrCl pt is on appropriate dosage of Pradaxa 150mg  BID.  Will refill rx.

## 2019-06-26 NOTE — Progress Notes (Signed)
Electrophysiology TeleHealth Note   Due to national recommendations of social distancing due to COVID 19, an audio/video telehealth visit is felt to be most appropriate for this patient at this time.  See MyChart message from today for the patient's consent to telehealth for [facility].   Date:  06/26/2019   ID:  Joshua [name], DOB 1951-03-07, MRN [id number]  Location: patient's home  Provider location: [address] [place]  Evaluation Performed: Follow-up visit  PCP:  Leanna Battles, MD  Cardiologist:  Cristopher Peru, MD  Electrophysiologist:  Dr   Chief Complaint:  " I been doing alright."  History of Present Illness:    Joshua [name] is a 68 y.o. male who presents via audio/video conferencing for a telehealth visit today.  Since last being seen in our clinic, the patient reports doing very well.  Today, he denies symptoms of palpitations, chest pain, shortness of breath,  lower extremity edema, dizziness, presyncope, or syncope.  The patient is otherwise without complaint today.  The patient denies symptoms of fevers, chills, cough, or new SOB worrisome for COVID 19.  Past Medical History:  Diagnosis Date  . Arthritis   . Atrial fibrillation ([place])   . Atrial flutter ([place])   . Back pain   . CAD (coronary artery disease)    stents placed 2009  . Complication of anesthesia    woke up during surgery and ablation  . DM (diabetes mellitus) ([place])    type 2  . Elevated PSA   . Gout   . Hearing problem   . HTN (hypertension)   . Hyperlipidemia   . Myocardial infarct, old 2010  . Prostatitis   . Renal insufficiency     Past Surgical History:  Procedure Laterality Date  . CARDIAC CATHETERIZATION  11/07/07   with 2 stents  . COLONOSCOPY    . 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    Marland Kitchen EXTERNAL EAR SURGERY Right    cartilage  . LEFT AND RIGHT HEART CATHETERIZATION WITH CORONARY/GRAFT ANGIOGRAM N/A  09/27/2014   Procedure: LEFT AND RIGHT HEART CATHETERIZATION WITH Beatrix Fetters;  Surgeon: Sinclair Grooms, MD;  Location: [facility] CATH LAB;  Service: Cardiovascular;  Laterality: N/A;  . LUMBAR LAMINECTOMY/DECOMPRESSION MICRODISCECTOMY Left 04/29/2018   Procedure: Left Lumbar Four-Five Laminectomy/Foraminotomy;  Surgeon: Eustace Moore, MD;  Location: [facility];  Service: Neurosurgery;  Laterality: Left;  Left Lumbar Four-Five Laminectomy/Foraminotomy  . TOOTH EXTRACTION    . VASECTOMY      Current Outpatient Medications  Medication Sig Dispense Refill  . acetaminophen (TYLENOL) 650 MG CR tablet Take 1,300 mg by mouth 2 (two) times daily.    Marland Kitchen amLODipine (NORVASC) 5 MG tablet Take 1 tablet (5 mg total) by mouth daily. Please keep upcoming appt in September with Dr. Lovena Le for future refills. Thank you 90 tablet 0  . aspirin EC 81 MG tablet Take 81 mg by mouth daily.    Marland Kitchen atenolol (TENORMIN) 50 MG tablet Take 1 tablet (50 mg total) by mouth 2 (two) times daily. Please keep upcoming appt with Dr. Lovena Le in September for future refills. Thank you 180 tablet 0  . atorvastatin (LIPITOR) 40 MG tablet TAKE 1 TABLET BY MOUTH EVERY DAY 90 tablet 0  . esomeprazole (NEXIUM) 20 MG capsule Take 20 mg by mouth every evening.    . fenofibrate micronized (LOFIBRA) 200 MG capsule Take 200 mg by mouth at bedtime.     Marland Kitchen  HYDROcodone-acetaminophen (NORCO/VICODIN) 5-325 MG tablet Take 1 tablet by mouth every 4 (four) hours as needed for moderate pain ((score 4 to 6)). 30 tablet 0  . nitroGLYCERIN (NITROSTAT) 0.4 MG SL tablet Place 0.4 mg under the tongue every 5 (five) minutes x 3 doses as needed for chest pain.    . pioglitazone (ACTOS) 45 MG tablet Take 45 mg by mouth every evening.     Marland Kitchen PRADAXA 150 MG CAPS capsule TAKE 1 CAPSULE BY MOUTH TWICE A DAY 90 capsule 4  . quinapril (ACCUPRIL) 40 MG tablet Take 1 tablet (40 mg total) by mouth daily. Please keep upcoming appt with Dr. Lovena Le in September for future  refills. Thank you 90 tablet 0   No current facility-administered medications for this visit.     Allergies:   Desipramine and Niacin   Social History:  The patient  reports that he has never smoked. He has quit using smokeless tobacco.  His smokeless tobacco use included chew. He reports that he does not drink alcohol or use drugs.   Family History:  The patient's  family history includes Alzheimer's disease in his mother; Cancer in his brother; Gout in his mother; Heart disease in his brother; Heart failure in his father; Hypertension in his mother.   ROS:  Please see the history of present illness.   All other systems are personally reviewed and negative.    Exam:    Vital Signs:  BP 126/73   Pulse (!) 59   Ht 5\' 8"  (1.727 m)   Wt 290 lb (131.5 kg)   BMI 44.09 kg/m     Labs/Other Tests and Data Reviewed:    Recent Labs: No results found for requested labs within last 8760 hours.   Wt Readings from Last 3 Encounters:  06/26/19 290 lb (131.5 kg)  04/29/18 294 lb (133.4 kg)  04/27/18 294 lb (133.4 kg)     Other studies personally reviewed:    ASSESSMENT & PLAN:    1.  Atrial fib - he has been well controlled and denies symptoms.  2. Back pain - his symptoms are improved. He still has some trouble walking. 3. HTN - His bp is good today.    COVID 19 screen The patient denies symptoms of COVID 19 at this time.  The importance of social distancing was discussed today.  Follow-up:  1 year Next remote: n/a  Current medicines are reviewed at length with the patient today.   The patient does not have concerns regarding his medicines.  The following changes were made today:  none  Labs/ tests ordered today include: none No orders of the defined types were placed in this encounter.  Patient Risk:  after full review of this patients clinical status, I feel that they are at moderate risk at this time.  Today, I have spent 15 minutes with the patient with telehealth  technology discussing all of the above. Loma Messing, MD  06/26/2019 10:03 AM     [place] [address] [place] [place]  [place] [address] [phone] (office) [phone] (fax)

## 2019-07-05 DIAGNOSIS — R69 Illness, unspecified: Secondary | ICD-10-CM | POA: Diagnosis not present

## 2019-07-06 ENCOUNTER — Other Ambulatory Visit: Payer: Self-pay | Admitting: Internal Medicine

## 2019-07-06 DIAGNOSIS — I2510 Atherosclerotic heart disease of native coronary artery without angina pectoris: Secondary | ICD-10-CM

## 2019-08-09 ENCOUNTER — Other Ambulatory Visit: Payer: Self-pay | Admitting: Internal Medicine

## 2019-08-09 DIAGNOSIS — I10 Essential (primary) hypertension: Secondary | ICD-10-CM

## 2019-08-11 ENCOUNTER — Other Ambulatory Visit: Payer: Self-pay | Admitting: Internal Medicine

## 2019-08-11 DIAGNOSIS — I10 Essential (primary) hypertension: Secondary | ICD-10-CM

## 2019-08-18 ENCOUNTER — Other Ambulatory Visit: Payer: Self-pay | Admitting: Internal Medicine

## 2019-08-18 DIAGNOSIS — I10 Essential (primary) hypertension: Secondary | ICD-10-CM

## 2019-09-15 ENCOUNTER — Other Ambulatory Visit: Payer: Self-pay | Admitting: Internal Medicine

## 2019-09-15 DIAGNOSIS — I10 Essential (primary) hypertension: Secondary | ICD-10-CM

## 2019-10-02 DIAGNOSIS — G4700 Insomnia, unspecified: Secondary | ICD-10-CM | POA: Diagnosis not present

## 2019-10-02 DIAGNOSIS — E1129 Type 2 diabetes mellitus with other diabetic kidney complication: Secondary | ICD-10-CM | POA: Diagnosis not present

## 2019-10-02 DIAGNOSIS — I480 Paroxysmal atrial fibrillation: Secondary | ICD-10-CM | POA: Diagnosis not present

## 2019-10-02 DIAGNOSIS — R69 Illness, unspecified: Secondary | ICD-10-CM | POA: Diagnosis not present

## 2019-10-02 DIAGNOSIS — I2510 Atherosclerotic heart disease of native coronary artery without angina pectoris: Secondary | ICD-10-CM | POA: Diagnosis not present

## 2019-10-04 ENCOUNTER — Other Ambulatory Visit: Payer: Self-pay | Admitting: Internal Medicine

## 2019-10-04 DIAGNOSIS — I10 Essential (primary) hypertension: Secondary | ICD-10-CM

## 2019-10-06 IMAGING — CT CT L SPINE W/ CM
1 of 6 series · 5 of 14 positions shown, 7 images · non-contrast
Comparison: Lumbar spine radiographs 12/02/2017. CT of the abdomen
pelvis 03/28/2014

CLINICAL DATA: Low back pain extending into the left lower
extremity.
TECHNIQUE: Contiguous axial images were obtained through the Lumbar spine after
the intrathecal infusion of infusion. Coronal and sagittal
reconstructions were obtained of the axial image sets.

[Series 2: l spine soft · axial · 0.29mm/px · z∈[-274,-94]mm · 5 of 91 slices shown, 7 images]
[im 16/91  soft-tissue]
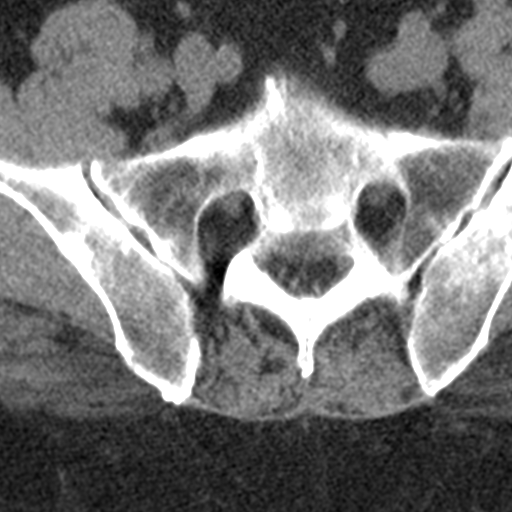
[im 16/91  bone]
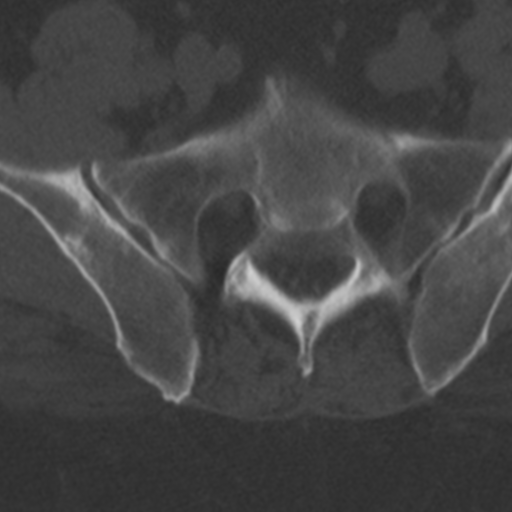
[im 31/91  bone]
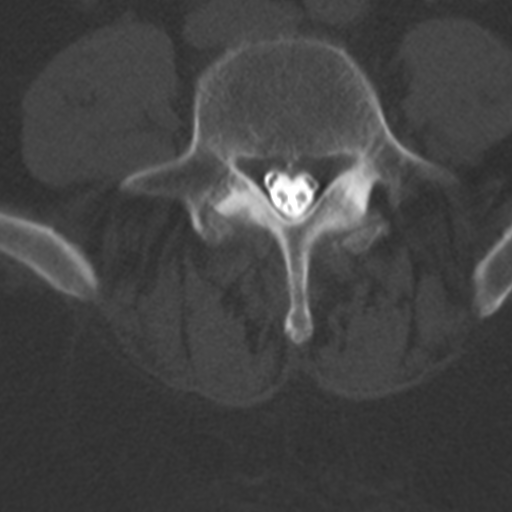
[im 46/91  bone]
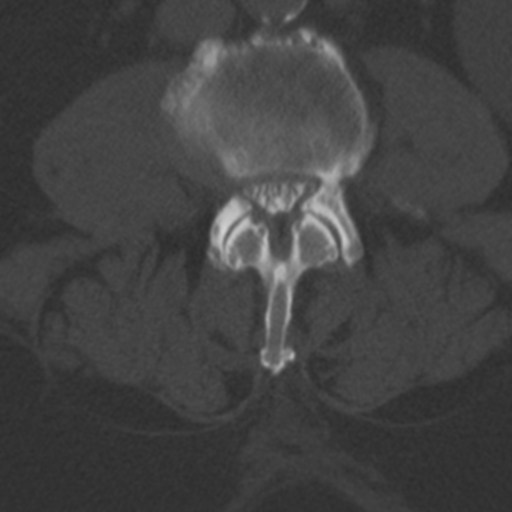
[im 61/91  bone]
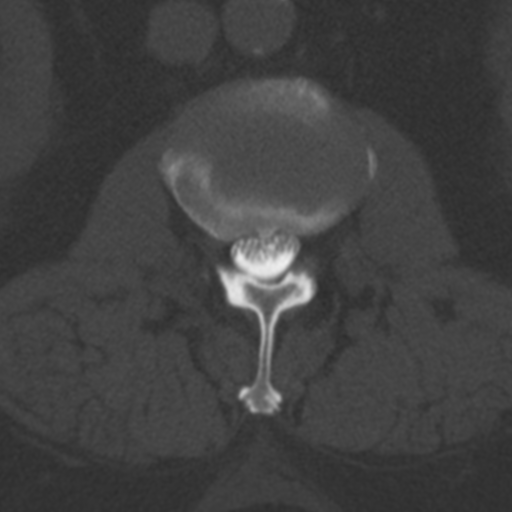
[im 76/91  soft-tissue]
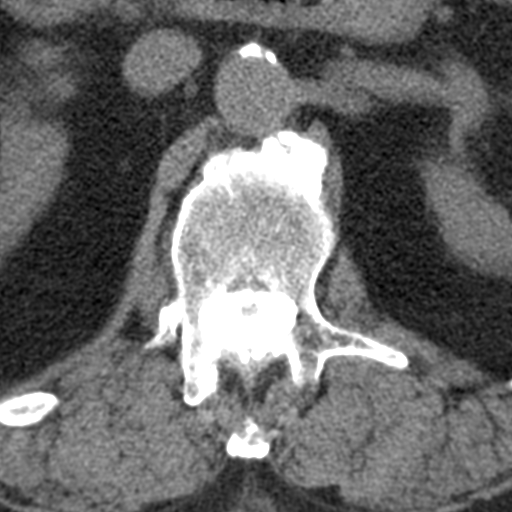
[im 76/91  bone]
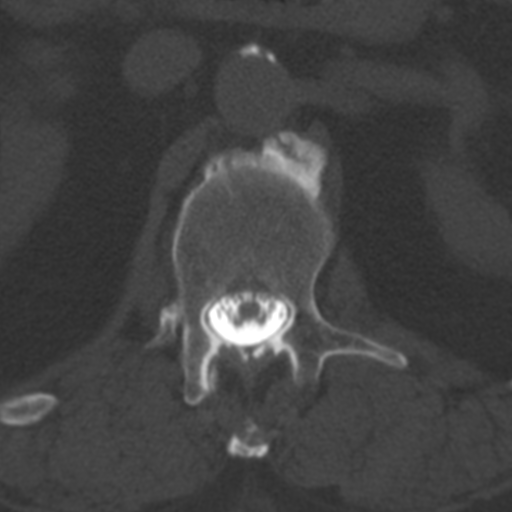

[5 of 14 positions shown; findings below may reference images not displayed]

EXAM:
LUMBAR MYELOGRAM

FLUOROSCOPY TIME:  Radiation Exposure Index (as provided by the
fluoroscopic device): 462.89 uGy*m2

Fluoroscopy Time:  50 seconds

Number of Acquired Images:  0

PROCEDURE:
After thorough discussion of risks and benefits of the procedure
including bleeding, infection, injury to nerves, blood vessels,
adjacent structures as well as headache and CSF leak, written and
oral informed consent was obtained. Consent was obtained by Dr.
Lochna Abey. Time out form was completed.

Patient was positioned prone on the fluoroscopy table. Local
anesthesia was provided with 1% lidocaine without epinephrine after
prepped and draped in the usual sterile fashion. Puncture was
performed at L3-4 using a 3 1/2 inch 22-gauge spinal needle via
right paramedian approach. Using a single pass through the dura, the
needle was placed within the thecal sac, with return of clear CSF.
15 mL of Isovue U-[id number] was injected into the thecal sac, with normal
opacification of the nerve roots and cauda equina consistent with
free flow within the subarachnoid space.

I personally performed the lumbar puncture and administered the
intrathecal contrast. I also personally supervised acquisition of
the myelogram images.
FINDINGS: LUMBAR MYELOGRAM FINDINGS:

Five non rib-bearing lumbar type vertebral bodies are present.
Severe central canal stenosis is present at L4-5. A more mild
rightward disc protrusion is present at L3-4 with mild right
subarticular narrowing. Significant stenosis is evident at L5-S1.
Slight anterolisthesis at L4-5 does not change significantly with
flexion or extension. Slight retrolisthesis at L 2 3 is also stable.
The disc protrusion at L4-5 is slightly more profound with standing.

Fused anterior osteophytes are present in the lower thoracic spine
to the level of L1-2.

CT LUMBAR MYELOGRAM FINDINGS:

The lumbar spine is imaged from the midbody of T11 through S3-4.
Five non rib-bearing lumbar type vertebral bodies are present.
Slight retrolisthesis is present at L2-3 and L3-4. Anterolisthesis
at L4-5 is less pronounced than on the prone and upright images.

Atherosclerotic calcifications are present in the aorta and branch
vessels without aneurysm or significant stenosis. The soft tissues
are otherwise unremarkable. No significant adenopathy is present.

L1-2: Negative.

L2-3: Moderate facet hypertrophy is present bilaterally. There is
some disc bulging without significant stenosis.

L3-4: A broad-based disc protrusion is present. Moderate facet
hypertrophy is noted. This results in mild subarticular narrowing
bilaterally, worse on right.

L4-5: A broad-based disc protrusion is present. Advanced facet
hypertrophy is noted bilaterally. Severe central canal stenosis is
present. Moderate foraminal narrowing is worse left than right.

L5-S1: Moderate facet hypertrophy is worse on the right. Mild disc
bulging. No significant central canal stenosis is present. Moderate
right foraminal stenosis is present.
IMPRESSION: 1. Severe central canal stenosis at L4-5 with moderate foraminal
narrowing bilaterally, left greater than right.
2. Slight anterolisthesis at L4-5 does not change significantly with
flexion or extension, but is worse in the prone and upright
positions than in the supine position suggesting some mobility at
this level.
3. Moderate right foraminal stenosis at L5-S1 secondary to facet
spurring.
4. Slight retrolisthesis at L3-4 with mild subarticular narrowing
bilaterally. Moderate facet hypertrophy is present at this level.
5. Moderate facet hypertrophy at L2-3 with slight retrolisthesis but
no focal stenosis.
6.  Aortic Atherosclerosis ([id number]).

## 2019-12-28 DIAGNOSIS — R69 Illness, unspecified: Secondary | ICD-10-CM | POA: Diagnosis not present

## 2020-01-02 DIAGNOSIS — Z1331 Encounter for screening for depression: Secondary | ICD-10-CM | POA: Diagnosis not present

## 2020-01-02 DIAGNOSIS — R0609 Other forms of dyspnea: Secondary | ICD-10-CM | POA: Diagnosis not present

## 2020-01-02 DIAGNOSIS — I2510 Atherosclerotic heart disease of native coronary artery without angina pectoris: Secondary | ICD-10-CM | POA: Diagnosis not present

## 2020-01-02 DIAGNOSIS — E1129 Type 2 diabetes mellitus with other diabetic kidney complication: Secondary | ICD-10-CM | POA: Diagnosis not present

## 2020-01-04 DIAGNOSIS — E1129 Type 2 diabetes mellitus with other diabetic kidney complication: Secondary | ICD-10-CM | POA: Diagnosis not present

## 2020-01-24 ENCOUNTER — Other Ambulatory Visit: Payer: Self-pay | Admitting: Internal Medicine

## 2020-01-24 DIAGNOSIS — I480 Paroxysmal atrial fibrillation: Secondary | ICD-10-CM

## 2020-01-24 NOTE — Telephone Encounter (Signed)
Pt last saw Dr Lovena Le 06/26/19 telemedicine Covid-19, last labs 03/13/19 Creat 1.4 at [place] per [place], age 69, weight 131.5kg, CrCl 90.01, based on CrCl pt is on appropriate dosage of Pradaxa 150mg  BID.

## 2020-03-18 DIAGNOSIS — E7849 Other hyperlipidemia: Secondary | ICD-10-CM | POA: Diagnosis not present

## 2020-03-18 DIAGNOSIS — Z125 Encounter for screening for malignant neoplasm of prostate: Secondary | ICD-10-CM | POA: Diagnosis not present

## 2020-03-18 DIAGNOSIS — E1129 Type 2 diabetes mellitus with other diabetic kidney complication: Secondary | ICD-10-CM | POA: Diagnosis not present

## 2020-03-18 DIAGNOSIS — Z0000 Encounter for general adult medical examination without abnormal findings: Secondary | ICD-10-CM | POA: Diagnosis not present

## 2020-03-25 DIAGNOSIS — E1129 Type 2 diabetes mellitus with other diabetic kidney complication: Secondary | ICD-10-CM | POA: Diagnosis not present

## 2020-03-25 DIAGNOSIS — G4700 Insomnia, unspecified: Secondary | ICD-10-CM | POA: Diagnosis not present

## 2020-03-25 DIAGNOSIS — I480 Paroxysmal atrial fibrillation: Secondary | ICD-10-CM | POA: Diagnosis not present

## 2020-03-25 DIAGNOSIS — I1310 Hypertensive heart and chronic kidney disease without heart failure, with stage 1 through stage 4 chronic kidney disease, or unspecified chronic kidney disease: Secondary | ICD-10-CM | POA: Diagnosis not present

## 2020-03-25 DIAGNOSIS — E785 Hyperlipidemia, unspecified: Secondary | ICD-10-CM | POA: Diagnosis not present

## 2020-03-25 DIAGNOSIS — R0609 Other forms of dyspnea: Secondary | ICD-10-CM | POA: Diagnosis not present

## 2020-03-25 DIAGNOSIS — Z0000 Encounter for general adult medical examination without abnormal findings: Secondary | ICD-10-CM | POA: Diagnosis not present

## 2020-03-25 DIAGNOSIS — M109 Gout, unspecified: Secondary | ICD-10-CM | POA: Diagnosis not present

## 2020-03-25 DIAGNOSIS — I2510 Atherosclerotic heart disease of native coronary artery without angina pectoris: Secondary | ICD-10-CM | POA: Diagnosis not present

## 2020-03-25 DIAGNOSIS — R82998 Other abnormal findings in urine: Secondary | ICD-10-CM | POA: Diagnosis not present

## 2020-03-28 DIAGNOSIS — R69 Illness, unspecified: Secondary | ICD-10-CM | POA: Diagnosis not present

## 2020-03-29 DIAGNOSIS — K921 Melena: Secondary | ICD-10-CM | POA: Diagnosis not present

## 2020-04-23 ENCOUNTER — Other Ambulatory Visit: Payer: Self-pay | Admitting: Internal Medicine

## 2020-04-23 DIAGNOSIS — I10 Essential (primary) hypertension: Secondary | ICD-10-CM

## 2020-06-25 ENCOUNTER — Encounter: Payer: Self-pay | Admitting: Internal Medicine

## 2020-06-25 ENCOUNTER — Telehealth (INDEPENDENT_AMBULATORY_CARE_PROVIDER_SITE_OTHER): Payer: Medicare HMO | Admitting: Internal Medicine

## 2020-06-25 ENCOUNTER — Other Ambulatory Visit: Payer: Self-pay

## 2020-06-25 DIAGNOSIS — I10 Essential (primary) hypertension: Secondary | ICD-10-CM

## 2020-06-25 DIAGNOSIS — R69 Illness, unspecified: Secondary | ICD-10-CM | POA: Diagnosis not present

## 2020-06-25 DIAGNOSIS — I480 Paroxysmal atrial fibrillation: Secondary | ICD-10-CM

## 2020-06-25 MED ORDER — ATENOLOL 50 MG PO TABS: ORAL_TABLET | ORAL | 3 refills | Status: DC

## 2020-06-25 NOTE — Progress Notes (Signed)
Electrophysiology TeleHealth Note   Due to national recommendations of social distancing due to COVID 19, an audio/video telehealth visit is felt to be most appropriate for this patient at this time.  See MyChart message from today for the patient's consent to telehealth for [facility].   Date:  06/25/2020   ID:  Joshua [name], DOB [birth year], MRN [id number]  Location: patient's home  Provider location: [address] [place]  Evaluation Performed: Follow-up visit  PCP:  Leanna Battles, MD  Cardiologist:  Cristopher Peru, MD  Electrophysiologist:  Dr Lovena Le  Chief Complaint:  "we've been staying inside."   History of Present Illness:    Joshua [name] is a 69 y.o. male who presents via audio/video conferencing for a telehealth visit today.  Since last being seen in our clinic, the patient reports doing very well.  Today, he denies symptoms of palpitations, chest pain, shortness of breath,  lower extremity edema, dizziness, presyncope, or syncope.  The patient is otherwise without complaint today.  The patient denies symptoms of fevers, chills, cough, or new SOB worrisome for COVID 19.  Past Medical History:  Diagnosis Date  . Arthritis   . Atrial fibrillation ([place])   . Atrial flutter ([place])   . Back pain   . CAD (coronary artery disease)    stents placed 2009  . Complication of anesthesia    woke up during surgery and ablation  . DM (diabetes mellitus) ([place])    type 2  . Elevated PSA   . Gout   . Hearing problem   . HTN (hypertension)   . Hyperlipidemia   . Myocardial infarct, old 2010  . Prostatitis   . Renal insufficiency     Past Surgical History:  Procedure Laterality Date  . CARDIAC CATHETERIZATION  11/07/07   with 2 stents  . COLONOSCOPY    . 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    Marland Kitchen EXTERNAL EAR SURGERY Right    cartilage  . LEFT AND RIGHT HEART CATHETERIZATION WITH CORONARY/GRAFT  ANGIOGRAM N/A 09/27/2014   Procedure: LEFT AND RIGHT HEART CATHETERIZATION WITH Beatrix Fetters;  Surgeon: Sinclair Grooms, MD;  Location: [facility] CATH LAB;  Service: Cardiovascular;  Laterality: N/A;  . LUMBAR LAMINECTOMY/DECOMPRESSION MICRODISCECTOMY Left 04/29/2018   Procedure: Left Lumbar Four-Five Laminectomy/Foraminotomy;  Surgeon: Eustace Moore, MD;  Location: [place];  Service: Neurosurgery;  Laterality: Left;  Left Lumbar Four-Five Laminectomy/Foraminotomy  . TOOTH EXTRACTION    . VASECTOMY      Current Outpatient Medications  Medication Sig Dispense Refill  . acetaminophen (TYLENOL) 650 MG CR tablet Take 1,300 mg by mouth 2 (two) times daily.    Marland Kitchen amLODipine (NORVASC) 5 MG tablet TAKE 1 TABLET BY MOUTH EVERY DAY 90 tablet 1  . aspirin EC 81 MG tablet Take 81 mg by mouth daily.    Marland Kitchen atenolol (TENORMIN) 50 MG tablet TAKE 1 TABLET BY MOUTH TWICE A DAY 180 tablet 3  . esomeprazole (NEXIUM) 20 MG capsule Take 20 mg by mouth every evening.    . fenofibrate 160 MG tablet Take 160 mg by mouth daily.    Marland Kitchen HYDROcodone-acetaminophen (NORCO/VICODIN) 5-325 MG tablet Take 1 tablet by mouth every 4 (four) hours as needed for moderate pain ((score 4 to 6)). 30 tablet 0  . isosorbide mononitrate (IMDUR) 30 MG 24 hr tablet Take 30 mg by mouth daily.    . nitroGLYCERIN (NITROSTAT) 0.4 MG  SL tablet Place 0.4 mg under the tongue every 5 (five) minutes x 3 doses as needed for chest pain.    . pioglitazone (ACTOS) 45 MG tablet Take 45 mg by mouth every evening.     Marland Kitchen PRADAXA 150 MG CAPS capsule TAKE 1 CAPSULE BY MOUTH TWICE A DAY 60 capsule 5  . quinapril (ACCUPRIL) 40 MG tablet TAKE 1 TABLET BY MOUTH EVERY DAY 90 tablet 2  . rosuvastatin (CRESTOR) 20 MG tablet Take 20 mg by mouth daily.     No current facility-administered medications for this visit.    Allergies:   Desipramine and Niacin   Social History:  The patient  reports that he has never smoked. He has quit using smokeless tobacco.  His  smokeless tobacco use included chew. He reports that he does not drink alcohol and does not use drugs.   Family History:  The patient's family history includes Alzheimer's disease in his mother; Cancer in his brother; Gout in his mother; Heart disease in his brother; Heart failure in his father; Hypertension in his mother.   ROS:  Please see the history of present illness.   All other systems are personally reviewed and negative.    Exam:    Vital Signs:  BP 124/73   Pulse (!) 48   Ht 5\' 8"  (1.727 m)   Wt 277 lb (125.6 kg)   BMI 42.12 kg/m     Labs/Other Tests and Data Reviewed:    Recent Labs: No results found for requested labs within last 8760 hours.   Wt Readings from Last 3 Encounters:  06/25/20 277 lb (125.6 kg)  06/26/19 290 lb (131.5 kg)  04/29/18 294 lb (133.4 kg)     Other studies personally reviewed: Additional studies/ records that were reviewed today include:     ASSESSMENT & PLAN:    1.  PAF - he is maintaining NSR by symptoms. He will continue his current meds except I recommended that he reduce his dose of atenolol to 50/25 a.m/p.m 2. Back pain - he is s/p back surgery. His numbness is improved but he still has back pain. 3. HTN - his bp is controlled. 4. Sinus node dysfunction - his HR is slower. I have recommended he reduce the dose of his atenolol as outlined above.   COVID 19 screen The patient denies symptoms of COVID 19 at this time.  The importance of social distancing was discussed today.  Follow-up:  12 months Next remote: n/a  Current medicines are reviewed at length with the patient today.   The patient does not have concerns regarding his medicines.  The following changes were made today:  none  Labs/ tests ordered today include: none No orders of the defined types were placed in this encounter.    Patient Risk:  after full review of this patients clinical status, I feel that they are at moderate risk at this time.  Today, I have  spent 15 minutes with the patient with telehealth technology discussing all of the above .    Signed, Cristopher Peru, MD  06/25/2020 11:37 AM     [place] [address] [place] [address] [phone] (office) [phone] (fax)

## 2020-07-01 DIAGNOSIS — R69 Illness, unspecified: Secondary | ICD-10-CM | POA: Diagnosis not present

## 2020-07-06 ENCOUNTER — Other Ambulatory Visit: Payer: Self-pay | Admitting: Internal Medicine

## 2020-07-06 DIAGNOSIS — I10 Essential (primary) hypertension: Secondary | ICD-10-CM

## 2020-07-08 DIAGNOSIS — R69 Illness, unspecified: Secondary | ICD-10-CM | POA: Diagnosis not present

## 2020-07-24 ENCOUNTER — Other Ambulatory Visit: Payer: Self-pay | Admitting: Internal Medicine

## 2020-07-24 DIAGNOSIS — I480 Paroxysmal atrial fibrillation: Secondary | ICD-10-CM

## 2020-07-24 NOTE — Telephone Encounter (Signed)
Pradaxa 150mg  refill request received. Pt is 69 years old, weight-125.6kg, Crea-1.50 on 03/18/2020 via [facility] from [place], last seen by Dr. Lovena Le via Telemedicine on 06/25/2020, Diagnosis-Afib/flutter, CrCl-80.53ml/min; Dose is appropriate based on dosing criteria. Will send in refill to requested pharmacy.

## 2020-09-11 DIAGNOSIS — R69 Illness, unspecified: Secondary | ICD-10-CM | POA: Diagnosis not present

## 2020-10-12 ENCOUNTER — Other Ambulatory Visit: Payer: Self-pay | Admitting: Internal Medicine

## 2020-10-12 DIAGNOSIS — I10 Essential (primary) hypertension: Secondary | ICD-10-CM

## 2020-12-17 ENCOUNTER — Other Ambulatory Visit: Payer: Self-pay | Admitting: Internal Medicine

## 2020-12-17 DIAGNOSIS — I480 Paroxysmal atrial fibrillation: Secondary | ICD-10-CM

## 2020-12-17 NOTE — Telephone Encounter (Signed)
21m, 125.6kg, Creatinine, Serum 1.500 mg/ 03/18/2020, ccr 80, lovw/taylor 06/25/20

## 2021-04-01 ENCOUNTER — Other Ambulatory Visit: Payer: Self-pay | Admitting: Internal Medicine

## 2021-04-01 DIAGNOSIS — I10 Essential (primary) hypertension: Secondary | ICD-10-CM

## 2021-04-09 DIAGNOSIS — Z125 Encounter for screening for malignant neoplasm of prostate: Secondary | ICD-10-CM | POA: Diagnosis not present

## 2021-04-09 DIAGNOSIS — I1310 Hypertensive heart and chronic kidney disease without heart failure, with stage 1 through stage 4 chronic kidney disease, or unspecified chronic kidney disease: Secondary | ICD-10-CM | POA: Diagnosis not present

## 2021-04-09 DIAGNOSIS — E785 Hyperlipidemia, unspecified: Secondary | ICD-10-CM | POA: Diagnosis not present

## 2021-04-09 DIAGNOSIS — M109 Gout, unspecified: Secondary | ICD-10-CM | POA: Diagnosis not present

## 2021-04-15 DIAGNOSIS — I10 Essential (primary) hypertension: Secondary | ICD-10-CM | POA: Diagnosis not present

## 2021-04-15 DIAGNOSIS — I1310 Hypertensive heart and chronic kidney disease without heart failure, with stage 1 through stage 4 chronic kidney disease, or unspecified chronic kidney disease: Secondary | ICD-10-CM | POA: Diagnosis not present

## 2021-04-15 DIAGNOSIS — Z0000 Encounter for general adult medical examination without abnormal findings: Secondary | ICD-10-CM | POA: Diagnosis not present

## 2021-04-15 DIAGNOSIS — I2510 Atherosclerotic heart disease of native coronary artery without angina pectoris: Secondary | ICD-10-CM | POA: Diagnosis not present

## 2021-04-15 DIAGNOSIS — E785 Hyperlipidemia, unspecified: Secondary | ICD-10-CM | POA: Diagnosis not present

## 2021-04-15 DIAGNOSIS — Z1331 Encounter for screening for depression: Secondary | ICD-10-CM | POA: Diagnosis not present

## 2021-04-15 DIAGNOSIS — I739 Peripheral vascular disease, unspecified: Secondary | ICD-10-CM | POA: Diagnosis not present

## 2021-04-15 DIAGNOSIS — M109 Gout, unspecified: Secondary | ICD-10-CM | POA: Diagnosis not present

## 2021-04-15 DIAGNOSIS — R82998 Other abnormal findings in urine: Secondary | ICD-10-CM | POA: Diagnosis not present

## 2021-04-15 DIAGNOSIS — I480 Paroxysmal atrial fibrillation: Secondary | ICD-10-CM | POA: Diagnosis not present

## 2021-04-15 DIAGNOSIS — Z1339 Encounter for screening examination for other mental health and behavioral disorders: Secondary | ICD-10-CM | POA: Diagnosis not present

## 2021-04-15 DIAGNOSIS — D72819 Decreased white blood cell count, unspecified: Secondary | ICD-10-CM | POA: Diagnosis not present

## 2021-04-15 DIAGNOSIS — E1129 Type 2 diabetes mellitus with other diabetic kidney complication: Secondary | ICD-10-CM | POA: Diagnosis not present

## 2021-04-15 LAB — IFOBT (OCCULT BLOOD): IFOBT: NEGATIVE

## 2021-05-15 ENCOUNTER — Other Ambulatory Visit: Payer: Self-pay | Admitting: Internal Medicine

## 2021-05-15 DIAGNOSIS — I480 Paroxysmal atrial fibrillation: Secondary | ICD-10-CM

## 2021-06-29 ENCOUNTER — Other Ambulatory Visit: Payer: Self-pay | Admitting: Internal Medicine

## 2021-06-29 DIAGNOSIS — I10 Essential (primary) hypertension: Secondary | ICD-10-CM

## 2021-07-01 ENCOUNTER — Ambulatory Visit: Payer: Medicare HMO | Admitting: Internal Medicine

## 2021-07-01 ENCOUNTER — Encounter: Payer: Self-pay | Admitting: Internal Medicine

## 2021-07-01 ENCOUNTER — Other Ambulatory Visit: Payer: Self-pay

## 2021-07-01 VITALS — BP 160/72 | HR 54 | Ht 68.0 in | Wt 282.0 lb

## 2021-07-01 DIAGNOSIS — I2583 Coronary atherosclerosis due to lipid rich plaque: Secondary | ICD-10-CM

## 2021-07-01 DIAGNOSIS — I10 Essential (primary) hypertension: Secondary | ICD-10-CM | POA: Diagnosis not present

## 2021-07-01 DIAGNOSIS — I480 Paroxysmal atrial fibrillation: Secondary | ICD-10-CM | POA: Diagnosis not present

## 2021-07-01 DIAGNOSIS — I2510 Atherosclerotic heart disease of native coronary artery without angina pectoris: Secondary | ICD-10-CM | POA: Diagnosis not present

## 2021-07-01 MED ORDER — ATENOLOL 50 MG PO TABS: ORAL_TABLET | ORAL | 3 refills | Status: DC

## 2021-07-01 MED ORDER — CARVEDILOL 12.5 MG PO TABS: 12.5000 mg | ORAL_TABLET | Freq: Two times a day (BID) | ORAL | 3 refills | Status: DC

## 2021-07-01 MED ORDER — QUINAPRIL HCL 40 MG PO TABS: 40.0000 mg | ORAL_TABLET | Freq: Every day | ORAL | 3 refills | Status: DC

## 2021-07-01 MED ORDER — AMLODIPINE BESYLATE 5 MG PO TABS: 5.0000 mg | ORAL_TABLET | Freq: Every day | ORAL | 3 refills | Status: DC

## 2021-07-01 MED ORDER — DABIGATRAN ETEXILATE MESYLATE 150 MG PO CAPS: 150.0000 mg | ORAL_CAPSULE | Freq: Two times a day (BID) | ORAL | 3 refills | Status: DC

## 2021-07-01 NOTE — Progress Notes (Signed)
HPI [name] returns today for followup of atrial fib. He is well known to me from remote SVT ablation. He has PAF. He denies chest pain or sob. He does not have angina. He has a remote stent.  His blood pressure has been a bit elevated and his HR is a little slow. He denies medical non-compliance.  Allergies  Allergen Reactions   Desipramine Hives and Itching   Niacin Itching     Current Outpatient Medications  Medication Sig Dispense Refill   acetaminophen (TYLENOL) 650 MG CR tablet Take 1,300 mg by mouth 2 (two) times daily.     amLODipine (NORVASC) 5 MG tablet Take 1 tablet (5 mg total) by mouth daily. 90 tablet 2   atenolol (TENORMIN) 50 MG tablet Take one tablet in the AM and 1/2 tablet in the PM 180 tablet 3   dabigatran (PRADAXA) 150 MG CAPS capsule Take 1 capsule (150 mg total) by mouth 2 (two) times daily. Please keep upcoming appt for future refills. Thank you 180 capsule 0   esomeprazole (NEXIUM) 20 MG capsule Take 20 mg by mouth every evening.     fenofibrate 160 MG tablet Take 160 mg by mouth daily.     HYDROcodone-acetaminophen (NORCO/VICODIN) 5-325 MG tablet Take 1 tablet by mouth every 4 (four) hours as needed for moderate pain ((score 4 to 6)). 30 tablet 0   isosorbide mononitrate (IMDUR) 30 MG 24 hr tablet Take 30 mg by mouth daily.     nitroGLYCERIN (NITROSTAT) 0.4 MG SL tablet Place 0.4 mg under the tongue every 5 (five) minutes x 3 doses as needed for chest pain.     pioglitazone (ACTOS) 45 MG tablet Take 45 mg by mouth every evening.      quinapril (ACCUPRIL) 40 MG tablet TAKE 1 TABLET BY MOUTH EVERY DAY 90 tablet 0   rosuvastatin (CRESTOR) 20 MG tablet Take 20 mg by mouth daily.     aspirin EC 81 MG tablet Take 81 mg by mouth daily.     No current facility-administered medications for this visit.     Past Medical History:  Diagnosis Date   Arthritis    Atrial fibrillation (HCC)    Atrial flutter (HCC)    Back pain    CAD (coronary artery disease)     stents placed 9470   Complication of anesthesia    woke up during surgery and ablation   DM (diabetes mellitus) ([place])    type 2   Elevated PSA    Gout    Hearing problem    HTN (hypertension)    Hyperlipidemia    Myocardial infarct, old 2010   Prostatitis    Renal insufficiency     ROS:   All systems reviewed and negative except as noted in the HPI.   Past Surgical History:  Procedure Laterality Date   CARDIAC CATHETERIZATION  11/07/07   with 2 stents   COLONOSCOPY     Electrophysiologic study and RF catheter ablation   of AV node reentrant tachycardia.     EXTERNAL EAR SURGERY Right    cartilage   LEFT AND RIGHT HEART CATHETERIZATION WITH CORONARY/GRAFT ANGIOGRAM N/A 09/27/2014   Procedure: LEFT AND RIGHT HEART CATHETERIZATION WITH Beatrix Fetters;  Surgeon: Sinclair Grooms, MD;  [facility]: [facility] CATH LAB;  Service: Cardiovascular;  Laterality: N/A;   LUMBAR LAMINECTOMY/DECOMPRESSION MICRODISCECTOMY Left 04/29/2018   Procedure: Left Lumbar Four-Five Laminectomy/Foraminotomy;  Surgeon: Eustace Moore, MD;  Location: [facility];  Service: Neurosurgery;  Laterality: Left;  Left Lumbar Four-Five Laminectomy/Foraminotomy   TOOTH EXTRACTION     VASECTOMY       Family History  Problem Relation Age of Onset   Hypertension Mother    Gout Mother    Alzheimer's disease Mother    Heart failure Father    Heart disease Brother    Cancer Brother        type unknown     Social History   Socioeconomic History   Marital status: Married    Spouse name: Not on file   Number of children: 2   Years of education: Not on file   Highest education level: Not on file  Occupational History   Occupation: SERV TECH    Employer: [employer]  Tobacco Use   Smoking status: Never   Smokeless tobacco: Former    Types: Chew  Substance and Sexual Activity   Alcohol use: No   Drug use: No   Sexual activity: Not on file  Other Topics Concern   Not on file  Social History Narrative    Not on file   Social Determinants of Health   Financial Resource Strain: Not on file  Food Insecurity: Not on file  Transportation Needs: Not on file  Physical Activity: Not on file  Stress: Not on file  Social Connections: Not on file  Intimate Partner Violence: Not on file     BP (!) 160/72   Pulse (!) 54   Ht 5\' 8"  (1.727 m)   Wt 282 lb (127.9 kg)   SpO2 98%   BMI 42.88 kg/m   Physical Exam:  Well appearing 70 yo man, NAD HEENT: Unremarkable Neck:  No JVD, no thyromegally Lymphatics:  No adenopathy Back:  No CVA tenderness Lungs:  Clear HEART:  Regular brady rhythm, no murmurs, no rubs, no clicks Abd:  soft, positive bowel sounds, no organomegally, no rebound, no guarding Ext:  2 plus pulses, 2-3+ edema, no cyanosis, no clubbing Skin:  No rashes no nodules Neuro:  CN II through XII intact, motor grossly intact  EKG - sinus brady  Assess/Plan:  1. Atrial fib - he is maintaining NSR very nicely. He has some sinus brady.  3. CAD - He is sedentary and is encouraged to increased his activity. No angina. No change in his meds except as below. 4. HTN - his blood pressure is not well controlled. I have asked him to switch from atenolol to coreg and to stop his amlodipine.  5. Peripheral edema - hopefully stopping the amlodipine will help with this.   Joshua [name].D.

## 2021-07-01 NOTE — Patient Instructions (Addendum)
Medication Instructions:  Your physician has recommended you make the following change in your medication:    STOP aspirin   2.   STOP atenolol  3.  STOP amlodipine  4.  START taking carvedilol 12. 5 mg-  Take one tablet by mouth twice a day   Labwork: None ordered.  Testing/Procedures: None ordered.  Follow-Up: Your physician wants you to follow-up in: 2- 3 weeks with one of the following Advanced Practice Providers on your designated Care Team:   Tommye Standard, [place] Legrand Como "Jonni Sanger" Chalmers Cater, [place]  Any Other Special Instructions Will Be Listed Below (If Applicable).  If you need a refill on your cardiac medications before your next appointment, please call your pharmacy.

## 2021-07-09 NOTE — Progress Notes (Signed)
Cardiology Office Note Date:  07/17/2021  Patient ID:  Joshua [name], Joshua [name] [birth year], MRN [id number] PCP:  Leanna Battles, MD  Electrophysiologist: Dr. Lovena Le     Chief Complaint:  2-3 week f/u  History of Present Illness: Joshua [name] is a 70 y.o. male with history of SVT (remotely ablated), AFib, CAD (PCI 2009), DM, HTN, HLD.  He comes today to be seen for Dr. Lovena Le, last seen by him 07/01/21, he was feeling well, maintaining SR by lack of symptoms.  BP was elevated and atenolol was changed to coreg.  He had asymptomatic bradycardia 50's.  Discussed peripheral edema, hoping to improve by stopping amlodipine.  TODAY He continues to f eel well Denies any CP, SOB, palpitations or cardiac awareness of any kind No dizzy spells, near syncope or syncope. His BPs at home generally have been 130's, and he says his swelling is by his estimation 80% improved.  He sleeps in a recliner for years 2/2 back pain, denies symptoms of orthopnea/PND No bleeding or signs of bleeding   Past Medical History:  Diagnosis Date   Arthritis    Atrial fibrillation (HCC)    Atrial flutter (HCC)    Back pain    CAD (coronary artery disease)    stents placed 3329   Complication of anesthesia    woke up during surgery and ablation   DM (diabetes mellitus) ([place])    type 2   Elevated PSA    Gout    Hearing problem    HTN (hypertension)    Hyperlipidemia    Myocardial infarct, old 2010   Prostatitis    Renal insufficiency     Past Surgical History:  Procedure Laterality Date   CARDIAC CATHETERIZATION  11/07/07   with 2 stents   COLONOSCOPY     Electrophysiologic study and RF catheter ablation   of AV node reentrant tachycardia.     EXTERNAL EAR SURGERY Right    cartilage   LEFT AND RIGHT HEART CATHETERIZATION WITH CORONARY/GRAFT ANGIOGRAM N/A 09/27/2014   Procedure: LEFT AND RIGHT HEART CATHETERIZATION WITH Beatrix Fetters;  Surgeon: Sinclair Grooms, MD;  Location: [facility] CATH  LAB;  Service: Cardiovascular;  Laterality: N/A;   LUMBAR LAMINECTOMY/DECOMPRESSION MICRODISCECTOMY Left 04/29/2018   Procedure: Left Lumbar Four-Five Laminectomy/Foraminotomy;  Surgeon: Eustace Moore, MD;  Location: [facility];  Service: Neurosurgery;  Laterality: Left;  Left Lumbar Four-Five Laminectomy/Foraminotomy   TOOTH EXTRACTION     VASECTOMY      Current Outpatient Medications  Medication Sig Dispense Refill   acetaminophen (TYLENOL) 650 MG CR tablet Take 1,300 mg by mouth 2 (two) times daily.     carvedilol (COREG) 12.5 MG tablet Take 1 tablet (12.5 mg total) by mouth 2 (two) times daily. 180 tablet 3   dabigatran (PRADAXA) 150 MG CAPS capsule Take 1 capsule (150 mg total) by mouth 2 (two) times daily. 180 capsule 3   esomeprazole (NEXIUM) 20 MG capsule Take 20 mg by mouth every evening.     fenofibrate 160 MG tablet Take 160 mg by mouth daily.     HYDROcodone-acetaminophen (NORCO/VICODIN) 5-325 MG tablet Take 1 tablet by mouth every 4 (four) hours as needed for moderate pain ((score 4 to 6)). 30 tablet 0   isosorbide mononitrate (IMDUR) 30 MG 24 hr tablet Take 30 mg by mouth daily.     nitroGLYCERIN (NITROSTAT) 0.4 MG SL tablet Place 0.4 mg under the tongue every 5 (five) minutes x 3 doses as needed for  chest pain.     pioglitazone (ACTOS) 45 MG tablet Take 45 mg by mouth every evening.      quinapril (ACCUPRIL) 40 MG tablet TAKE 1 TABLET BY MOUTH EVERY DAY 90 tablet 3   rosuvastatin (CRESTOR) 20 MG tablet Take 20 mg by mouth daily.     No current facility-administered medications for this visit.    Allergies:   Desipramine and Niacin   Social History:  The patient  reports that he has never smoked. He has quit using smokeless tobacco.  His smokeless tobacco use included chew. He reports that he does not drink alcohol and does not use drugs.   Family History:  The patient's family history includes Alzheimer's disease in his mother; Cancer in his brother; Gout in his mother; Heart  disease in his brother; Heart failure in his father; Hypertension in his mother.  ROS:  Please see the history of present illness.    All other systems are reviewed and otherwise negative.   PHYSICAL EXAM:  VS:  BP (!) 146/68   Pulse (!) 52   Ht 5\' 8"  (1.727 m)   Wt 263 lb (119.3 kg)   SpO2 98%   BMI 39.99 kg/m  BMI: Body mass index is 39.99 kg/m. Well nourished, well developed, in no acute distress HEENT: normocephalic, atraumatic Neck: no JVD, carotid bruits or masses Cardiac:  RRR; no significant murmurs, no rubs, or gallops Lungs:   CTA b/l, no wheezing, rhonchi or rales Abd: soft, nontender, obese MS: no deformity or atrophy Ext: 1++ edema b/l to just below midshin, chronic looking skin changes Skin: warm and dry, no rash Neuro:  No gross deficits appreciated Psych: euthymic mood, full affect     EKG:  not done today   09/26/2014: stress myoview  Impression Exercise Capacity:  Lexiscan with low level exercise. BP Response:  Normal blood pressure response. Clinical Symptoms:  There is dyspnea. ECG Impression:  No significant ST segment change suggestive of ischemia. Comparison with Prior Nuclear Study: No previous nuclear study performed   Overall Impression:  High risk stress nuclear study.  Study is consistent with multivessel coronary disease with medium size reversible defect of moderate intensity of the midanterior and anteroapical wall, and a medium size partially reversible defect of moderate intensity of the inferobasal wall.   LV Ejection Fraction: 47%.  LV Wall Motion:  Mildly depressed LV systolic function without segmental wall motion abnormalities.     09/18/2014: TTE Study Conclusions  - Left ventricle: The cavity size was normal. Wall thickness was    increased in a pattern of mild LVH. Systolic function was normal.    The estimated ejection fraction was in the range of 50% to 55%.    Wall motion was normal; there were no regional wall motion     abnormalities.  - Mitral valve: Calcified annulus. There was mild regurgitation.  - Left atrium: The atrium was moderately dilated.  - Pulmonary arteries: Systolic pressure was moderately increased.    PA peak pressure: 50 mm Hg (S).   Impressions:   - Normal LV function; mild LVH; moderate LAE; mild MR and TR;    moderately elevated pulmonary pressure.     Recent Labs: No results found for requested labs within last 8760 hours.  No results found for requested labs within last 8760 hours.   CrCl cannot be calculated (Patient's most recent lab result is older than the maximum 21 days allowed.).   Wt Readings from Last 3 Encounters:  07/17/21 263 lb (119.3 kg)  07/01/21 282 lb (127.9 kg)  06/25/20 277 lb (125.6 kg)     Other studies reviewed: Additional studies/records reviewed today include: summarized above  ASSESSMENT AND PLAN:  Paroxysmal Afib CHA2DS2Vasc is 4, on pradaxa, no recent labs for review No symptoms of AF  He reports his PMD did his annual labs in June/July time, we have called to get them sent to [place] He mentions there was concern about his kidneys and a referal to a kidney specialst, but has not yet gotten any word or information back about that   HTN A manual recheck by myself with large/appropriate sized cuff is 146/68 Await labs to make any further recommendations If he is going to see a nephrologist, might defer to them  CAD No anginal symptoms On ASA, BB, statin Labs done with PMD  Edema 80% better by his estimation As above, await labs and possibly nephrology referal via his PMD  5. Asymptomatic bradycardia Would not push BB dose   Disposition: F/u with [place] in 18mo, sooner if needed  Current medicines are reviewed at length with the patient today.  The patient did not have any concerns regarding medicines.  Venetia Night, PA-C 07/17/2021 1:39 PM     [address] [place] [address] [phone] (office)  [phone] (fax)

## 2021-07-17 ENCOUNTER — Other Ambulatory Visit: Payer: Self-pay

## 2021-07-17 ENCOUNTER — Encounter: Payer: Self-pay | Admitting: Physician Assistant

## 2021-07-17 ENCOUNTER — Ambulatory Visit (INDEPENDENT_AMBULATORY_CARE_PROVIDER_SITE_OTHER): Payer: Medicare HMO | Admitting: Physician Assistant

## 2021-07-17 VITALS — BP 146/68 | HR 52 | Ht 68.0 in | Wt 263.0 lb

## 2021-07-17 DIAGNOSIS — I2510 Atherosclerotic heart disease of native coronary artery without angina pectoris: Secondary | ICD-10-CM

## 2021-07-17 DIAGNOSIS — I10 Essential (primary) hypertension: Secondary | ICD-10-CM

## 2021-07-17 DIAGNOSIS — R609 Edema, unspecified: Secondary | ICD-10-CM

## 2021-07-17 DIAGNOSIS — I480 Paroxysmal atrial fibrillation: Secondary | ICD-10-CM | POA: Diagnosis not present

## 2021-07-17 NOTE — Patient Instructions (Signed)
Medication Instructions:   Your physician recommends that you continue on your current medications as directed. Please refer to the Current Medication list given to you today.   *If you need a refill on your cardiac medications before your next appointment, please call your pharmacy*   Lab Work: [place]   If you have labs (blood work) drawn today and your tests are completely normal, you will receive your results only by: [place] (if you have MyChart) OR A paper copy in the mail If you have any lab test that is abnormal or we need to change your treatment, we will call you to review the results.   Testing/Procedures: NONE ORDERED  TODAY     Follow-Up: At [facility], you and your health needs are our priority.  As part of our continuing mission to provide you with exceptional heart care, we have created designated Provider Care Teams.  These Care Teams include your primary Cardiologist (physician) and Advanced Practice Providers (APPs -  Physician Assistants and Nurse Practitioners) who all work together to provide you with the care you need, when you need it.  We recommend signing up for the patient portal called "MyChart".  Sign up information is provided on this After Visit Summary.  MyChart is used to connect with patients for Virtual Visits (Telemedicine).  Patients are able to view lab/test results, encounter notes, upcoming appointments, etc.  Non-urgent messages can be sent to your provider as well.   To learn more about what you can do with MyChart, go to [email or web address].    Your next appointment:   3 month(s)  The format for your next appointment:   In Person  Provider:   You will see one of the following Advanced Practice Providers on your designated Care Team:   Tommye Standard, [place] Legrand Como "Jonni Sanger" Chalmers Cater, [place]     Other Instructions

## 2021-08-25 ENCOUNTER — Telehealth: Payer: Self-pay | Admitting: Internal Medicine

## 2021-08-25 DIAGNOSIS — I480 Paroxysmal atrial fibrillation: Secondary | ICD-10-CM

## 2021-08-25 NOTE — Telephone Encounter (Signed)
Pt c/o medication issue:  1. Name of Medication:  dabigatran (PRADAXA) 150 MG CAPS capsule  2. How are you currently taking this medication (dosage and times per day)? As prescribed   3. Are you having a reaction (difficulty breathing--STAT)? No   4. What is your medication issue? Joshua [name] is calling stating he got a letter in the mail from his insurance advising as of January of 2023 this medication will no longer be covered. They recommended Eliquis, or Xarelto and advised if the Doctor felt necessary for him to stay on it it can be discussed. Without PA this medication would cost him $500 a month which he cannot afford. Please advise.

## 2021-08-25 NOTE — Telephone Encounter (Signed)
Returned call to Pt.  He does not want to switch to Eliquis or Xarelto.  He requests that we request an exception to his insurance to get the Pradaxa covered for me.  Advised we would try.

## 2021-09-11 NOTE — Telephone Encounter (Signed)
° °  Pt is calling to f/u, he said he will just let Dr. Lovena Le decide if he can eliquis or xarelto if he can get an exception for pradaxa, he would like to receive a cb today

## 2021-09-11 NOTE — Telephone Encounter (Signed)
Called patient to let him know we will not have any answers until next week when Dr. Lovena Le, his nurse, and Jeani Hawking are back in the office. Patient stated if he cannot get an exception for pradaxa that he is fine switching to eliquis or xarelto as long as Dr. Lovena Le is okay with the switch.

## 2021-09-15 NOTE — Telephone Encounter (Signed)
Patient would like someone to call about the status of his medication asap

## 2021-09-15 NOTE — Telephone Encounter (Signed)
**Note De-Identified Brecken Dewoody Obfuscation** Pradaxa tier exception started through covermymeds. Key: [facility]

## 2021-09-16 NOTE — Telephone Encounter (Signed)
**Note De-Identified Jasmain Ahlberg Obfuscation** I did this Pradaxa tier exception and did receive an approval but only until 09/27/2021.  I called [employer] and attempted to do a Pradaxa tier exception with the pharmacist review team. I s/w Kennyth Lose who advised me that the reason the Pradaxa tier exception was approved until 09/27/2021 is because Pradaxa is a non-formulary medication on the plan that the pt chose and a tier exception is not allowed for non-formulary drugs.  I called the pt with this update and he wants to know which medication between Xarelto and Eliquis Dr Lovena Le recommends that he switch to because he cannot afford Pradaxa at its current cost.  He is aware that I am forwarding this message to Dr Lovena Le and his nurse for advisement.

## 2021-09-19 MED ORDER — DABIGATRAN ETEXILATE MESYLATE 150 MG PO CAPS: 150.0000 mg | ORAL_CAPSULE | Freq: Two times a day (BID) | ORAL | 3 refills | Status: DC

## 2021-09-19 NOTE — Telephone Encounter (Signed)
Outreach made to Pt.  Pt just received letter from [facility] stating they approved his Pradaxa in 2023.  Advised to let us know if he needed anything else.

## 2021-10-21 ENCOUNTER — Ambulatory Visit: Payer: Medicare HMO | Admitting: Physician Assistant

## 2021-11-11 ENCOUNTER — Other Ambulatory Visit: Payer: Self-pay

## 2021-11-11 ENCOUNTER — Encounter: Payer: Self-pay | Admitting: Internal Medicine

## 2021-11-11 ENCOUNTER — Ambulatory Visit (INDEPENDENT_AMBULATORY_CARE_PROVIDER_SITE_OTHER): Payer: Medicare HMO | Admitting: Internal Medicine

## 2021-11-11 VITALS — BP 166/62 | HR 72 | Ht 68.0 in | Wt 272.6 lb

## 2021-11-11 DIAGNOSIS — I480 Paroxysmal atrial fibrillation: Secondary | ICD-10-CM | POA: Diagnosis not present

## 2021-11-11 DIAGNOSIS — I10 Essential (primary) hypertension: Secondary | ICD-10-CM

## 2021-11-11 DIAGNOSIS — R609 Edema, unspecified: Secondary | ICD-10-CM | POA: Diagnosis not present

## 2021-11-11 NOTE — Patient Instructions (Signed)
Medication Instructions:  Your physician recommends that you continue on your current medications as directed. Please refer to the Current Medication list given to you today.  *If you need a refill on your cardiac medications before your next appointment, please call your pharmacy*   Lab Work: None If you have labs (blood work) drawn today and your tests are completely normal, you will receive your results only by: MyChart Message (if you have MyChart) OR A paper copy in the mail If you have any lab test that is abnormal or we need to change your treatment, we will call you to review the results.   Follow-Up: At [facility], you and your health needs are our priority.  As part of our continuing mission to provide you with exceptional heart care, we have created designated Provider Care Teams.  These Care Teams include your primary Cardiologist (physician) and Advanced Practice Providers (APPs -  Physician Assistants and Nurse Practitioners) who all work together to provide you with the care you need, when you need it.  Your next appointment:   1 year(s)  The format for your next appointment:   In Person  Provider:   You may see Gregg Taylor, MD or one of the following Advanced Practice Providers on your designated Care Team:   Renee Ursuy, PA-C Michael "Andy" Tillery, PA-C   

## 2021-11-11 NOTE — Progress Notes (Signed)
HPI [name] returns today for followup of atrial fib. He is well known to me from remote SVT ablation. He has PAF. He denies chest pain or sob. He does not have angina. He has a remote stent.  His blood pressure has been a bit elevated and his HR is a little slow. He denies medical non-compliance. He had developed some peripheral edema on amlodipine and this was stopped and his edema improved. He denies palpitations. He has had trouble losing weight.  Allergies  Allergen Reactions   Desipramine Hives and Itching   Niacin Itching     Current Outpatient Medications  Medication Sig Dispense Refill   acetaminophen (TYLENOL) 650 MG CR tablet Take 1,300 mg by mouth 2 (two) times daily.     carvedilol (COREG) 12.5 MG tablet Take 1 tablet (12.5 mg total) by mouth 2 (two) times daily. 180 tablet 3   dabigatran (PRADAXA) 150 MG CAPS capsule Take 1 capsule (150 mg total) by mouth 2 (two) times daily. 180 capsule 3   esomeprazole (NEXIUM) 20 MG capsule Take 20 mg by mouth every evening.     fenofibrate 160 MG tablet Take 160 mg by mouth daily.     HYDROcodone-acetaminophen (NORCO/VICODIN) 5-325 MG tablet Take 1 tablet by mouth every 4 (four) hours as needed for moderate pain ((score 4 to 6)). 30 tablet 0   isosorbide mononitrate (IMDUR) 30 MG 24 hr tablet Take 30 mg by mouth daily.     nitroGLYCERIN (NITROSTAT) 0.4 MG SL tablet Place 0.4 mg under the tongue every 5 (five) minutes x 3 doses as needed for chest pain.     pioglitazone (ACTOS) 45 MG tablet Take 45 mg by mouth every evening.      quinapril (ACCUPRIL) 40 MG tablet TAKE 1 TABLET BY MOUTH EVERY DAY 90 tablet 3   rosuvastatin (CRESTOR) 20 MG tablet Take 20 mg by mouth daily.     No current facility-administered medications for this visit.     Past Medical History:  Diagnosis Date   Arthritis    Atrial fibrillation (HCC)    Atrial flutter (HCC)    Back pain    CAD (coronary artery disease)    stents placed 2831    Complication of anesthesia    woke up during surgery and ablation   DM (diabetes mellitus) ([place])    type 2   Elevated PSA    Gout    Hearing problem    HTN (hypertension)    Hyperlipidemia    Myocardial infarct, old 2010   Prostatitis    Renal insufficiency     ROS:   All systems reviewed and negative except as noted in the HPI.   Past Surgical History:  Procedure Laterality Date   CARDIAC CATHETERIZATION  11/07/07   with 2 stents   COLONOSCOPY     Electrophysiologic study and RF catheter ablation   of AV node reentrant tachycardia.     EXTERNAL EAR SURGERY Right    cartilage   LEFT AND RIGHT HEART CATHETERIZATION WITH CORONARY/GRAFT ANGIOGRAM N/A 09/27/2014   Procedure: LEFT AND RIGHT HEART CATHETERIZATION WITH Beatrix Fetters;  Surgeon: Sinclair Grooms, MD;  Location: [facility] C[facility] LAB;  Service: Cardiovascular;  Laterality: N/A;   LUMBAR LAMINECTOMY/DECOMPRESSION MICRODISCECTOMY Left 04/29/2018   Procedure: Left Lumbar Four-Five Laminectomy/Foraminotomy;  Surgeon: Eustace Moore, MD;  Location: [facility];  Service: Neurosurgery;  Laterality: Left;  Left Lumbar Four-Five Laminectomy/Foraminotomy   TOOTH EXTRACTION  VASECTOMY       Family History  Problem Relation Age of Onset   Hypertension Mother    Gout Mother    Alzheimer's disease Mother    Heart failure Father    Heart disease Brother    Cancer Brother        type unknown     Social History   Socioeconomic History   Marital status: Married    Spouse name: Not on file   Number of children: 2   Years of education: Not on file   Highest education level: Not on file  Occupational History   Occupation: SERV TECH    Employer: [employer]  Tobacco Use   Smoking status: Never   Smokeless tobacco: Former    Types: Chew  Substance and Sexual Activity   Alcohol use: No   Drug use: No   Sexual activity: Not on file  Other Topics Concern   Not on file  Social History Narrative   Not on file   Social  Determinants of Health   Financial Resource Strain: Not on file  Food Insecurity: Not on file  Transportation Needs: Not on file  Physical Activity: Not on file  Stress: Not on file  Social Connections: Not on file  Intimate Partner Violence: Not on file     There were no vitals taken for this visit.  Physical Exam:  Well appearing NAD HEENT: Unremarkable Neck:  No JVD, no thyromegally Lymphatics:  No adenopathy Back:  No CVA tenderness Lungs:  Clear with no wheezes HEART:  Regular rate rhythm, no murmurs, no rubs, no clicks Abd:  soft, positive bowel sounds, no organomegally, no rebound, no guarding Ext:  2 plus pulses, trace edema, no cyanosis, no clubbing Skin:  No rashes no nodules Neuro:  CN II through XII intact, motor grossly intact  Assess/Plan:  Atrial fib - his symptoms are fairly well controlled. No change in his meds for now. HTN - he notes that SBP is much better at home in the 130 range. Edema - this is much improved with stopping the amlodipine. CAD - he denies anginal symptoms.  Obesity - I encouraged him to lose weight. We discussed fasting and avoiding sugar sweetened beverages.  Joshua Overlie Micaiah Litle,MD

## 2022-01-01 ENCOUNTER — Ambulatory Visit: Payer: Medicare HMO | Admitting: Internal Medicine

## 2022-01-12 ENCOUNTER — Other Ambulatory Visit: Payer: Self-pay | Admitting: Internal Medicine

## 2022-01-12 DIAGNOSIS — I10 Essential (primary) hypertension: Secondary | ICD-10-CM

## 2022-01-14 NOTE — Telephone Encounter (Signed)
Per pharmacy-replace quinapril with lisinopril 40 mg PO daily. ? ?Medication sent. ?

## 2022-01-14 NOTE — Telephone Encounter (Signed)
Recommend replacing quinapril '40mg'$  daily (has been on backorder/recall for a while) with lisinopril '40mg'$  daily. ?

## 2022-01-15 ENCOUNTER — Telehealth: Payer: Self-pay | Admitting: Internal Medicine

## 2022-01-15 MED ORDER — BENAZEPRIL HCL 40 MG PO TABS: 40.0000 mg | ORAL_TABLET | Freq: Every day | ORAL | 3 refills | Status: DC

## 2022-01-15 NOTE — Telephone Encounter (Signed)
Rx for benazapril sent in ?

## 2022-01-15 NOTE — Telephone Encounter (Signed)
Pt c/o medication issue: ? ?1. Name of Medication: quinapril 40 mg ? ?2. How are you currently taking this medication (dosage and times per day)? 1 tablet daily ? ?3. Are you having a reaction (difficulty breathing--STAT)? no ? ?4. What is your medication issue? Patient states the pharmacy cannot get quinapril or lisinopril and needs a substitute medication sent in. He states the last day it was filled 09/26/2021 and he has been out of the medication for 4 days. He states he does not need a call back, he just requests the medication be sent to his pharmacy.  ?

## 2022-05-12 DIAGNOSIS — Z125 Encounter for screening for malignant neoplasm of prostate: Secondary | ICD-10-CM | POA: Diagnosis not present

## 2022-05-12 DIAGNOSIS — R7989 Other specified abnormal findings of blood chemistry: Secondary | ICD-10-CM | POA: Diagnosis not present

## 2022-05-12 DIAGNOSIS — E1129 Type 2 diabetes mellitus with other diabetic kidney complication: Secondary | ICD-10-CM | POA: Diagnosis not present

## 2022-05-12 DIAGNOSIS — I10 Essential (primary) hypertension: Secondary | ICD-10-CM | POA: Diagnosis not present

## 2022-05-12 DIAGNOSIS — E785 Hyperlipidemia, unspecified: Secondary | ICD-10-CM | POA: Diagnosis not present

## 2022-05-12 DIAGNOSIS — M109 Gout, unspecified: Secondary | ICD-10-CM | POA: Diagnosis not present

## 2022-05-19 DIAGNOSIS — M109 Gout, unspecified: Secondary | ICD-10-CM | POA: Diagnosis not present

## 2022-05-19 DIAGNOSIS — G4700 Insomnia, unspecified: Secondary | ICD-10-CM | POA: Diagnosis not present

## 2022-05-19 DIAGNOSIS — Z0000 Encounter for general adult medical examination without abnormal findings: Secondary | ICD-10-CM | POA: Diagnosis not present

## 2022-05-19 DIAGNOSIS — N1831 Chronic kidney disease, stage 3a: Secondary | ICD-10-CM | POA: Diagnosis not present

## 2022-05-19 DIAGNOSIS — E1121 Type 2 diabetes mellitus with diabetic nephropathy: Secondary | ICD-10-CM | POA: Diagnosis not present

## 2022-05-19 DIAGNOSIS — R82998 Other abnormal findings in urine: Secondary | ICD-10-CM | POA: Diagnosis not present

## 2022-05-19 DIAGNOSIS — D61818 Other pancytopenia: Secondary | ICD-10-CM | POA: Diagnosis not present

## 2022-05-19 DIAGNOSIS — E785 Hyperlipidemia, unspecified: Secondary | ICD-10-CM | POA: Diagnosis not present

## 2022-05-19 DIAGNOSIS — I1310 Hypertensive heart and chronic kidney disease without heart failure, with stage 1 through stage 4 chronic kidney disease, or unspecified chronic kidney disease: Secondary | ICD-10-CM | POA: Diagnosis not present

## 2022-05-19 DIAGNOSIS — I2510 Atherosclerotic heart disease of native coronary artery without angina pectoris: Secondary | ICD-10-CM | POA: Diagnosis not present

## 2022-05-19 DIAGNOSIS — I480 Paroxysmal atrial fibrillation: Secondary | ICD-10-CM | POA: Diagnosis not present

## 2022-05-22 ENCOUNTER — Telehealth: Payer: Self-pay | Admitting: Hematology and Oncology

## 2022-05-22 DIAGNOSIS — D72819 Decreased white blood cell count, unspecified: Secondary | ICD-10-CM | POA: Diagnosis not present

## 2022-05-22 NOTE — Telephone Encounter (Signed)
Scheduled appt per 8/23 referral. Pt is aware of appt date and time. Pt is aware to arrive 15 mins prior to appt time and to bring and updated insurance card. Pt is aware of appt location.   

## 2022-06-15 ENCOUNTER — Inpatient Hospital Stay: Payer: Medicare HMO

## 2022-06-15 ENCOUNTER — Inpatient Hospital Stay: Payer: Medicare HMO | Attending: Hematology and Oncology | Admitting: Hematology and Oncology

## 2022-06-15 ENCOUNTER — Other Ambulatory Visit: Payer: Self-pay

## 2022-06-15 VITALS — BP 117/52 | HR 66 | Temp 97.6°F | Resp 14 | Wt 253.7 lb

## 2022-06-15 DIAGNOSIS — I10 Essential (primary) hypertension: Secondary | ICD-10-CM | POA: Diagnosis not present

## 2022-06-15 DIAGNOSIS — E785 Hyperlipidemia, unspecified: Secondary | ICD-10-CM | POA: Diagnosis not present

## 2022-06-15 DIAGNOSIS — I2510 Atherosclerotic heart disease of native coronary artery without angina pectoris: Secondary | ICD-10-CM | POA: Diagnosis not present

## 2022-06-15 DIAGNOSIS — Z7901 Long term (current) use of anticoagulants: Secondary | ICD-10-CM | POA: Insufficient documentation

## 2022-06-15 DIAGNOSIS — D61818 Other pancytopenia: Secondary | ICD-10-CM | POA: Diagnosis not present

## 2022-06-15 DIAGNOSIS — I4891 Unspecified atrial fibrillation: Secondary | ICD-10-CM | POA: Diagnosis not present

## 2022-06-15 DIAGNOSIS — E119 Type 2 diabetes mellitus without complications: Secondary | ICD-10-CM | POA: Diagnosis not present

## 2022-06-15 LAB — CBC WITH DIFFERENTIAL (CANCER CENTER ONLY)
Abs Immature Granulocytes: 0.01 10*3/uL (ref 0.00–0.07)
Basophils Absolute: 0 10*3/uL (ref 0.0–0.1)
Basophils Relative: 1 %
Eosinophils Absolute: 0.1 10*3/uL (ref 0.0–0.5)
Eosinophils Relative: 3 %
HCT: 31.1 % — ABNORMAL LOW (ref 39.0–52.0)
Hemoglobin: 10.2 g/dL — ABNORMAL LOW (ref 13.0–17.0)
Immature Granulocytes: 0 %
Lymphocytes Relative: 28 %
Lymphs Abs: 0.9 10*3/uL (ref 0.7–4.0)
MCH: 31.6 pg (ref 26.0–34.0)
MCHC: 32.8 g/dL (ref 30.0–36.0)
MCV: 96.3 fL (ref 80.0–100.0)
Monocytes Absolute: 0.3 10*3/uL (ref 0.1–1.0)
Monocytes Relative: 9 %
Neutro Abs: 2 10*3/uL (ref 1.7–7.7)
Neutrophils Relative %: 59 %
Platelet Count: 96 10*3/uL — ABNORMAL LOW (ref 150–400)
RBC: 3.23 MIL/uL — ABNORMAL LOW (ref 4.22–5.81)
RDW: 17 % — ABNORMAL HIGH (ref 11.5–15.5)
WBC Count: 3.3 10*3/uL — ABNORMAL LOW (ref 4.0–10.5)
nRBC: 0 % (ref 0.0–0.2)

## 2022-06-15 LAB — CMP (CANCER CENTER ONLY)
ALT: 15 U/L (ref 0–44)
AST: 32 U/L (ref 15–41)
Albumin: 4.1 g/dL (ref 3.5–5.0)
Alkaline Phosphatase: 22 U/L — ABNORMAL LOW (ref 38–126)
Anion gap: 9 (ref 5–15)
BUN: 64 mg/dL — ABNORMAL HIGH (ref 8–23)
CO2: 17 mmol/L — ABNORMAL LOW (ref 22–32)
Calcium: 9.3 mg/dL (ref 8.9–10.3)
Chloride: 107 mmol/L (ref 98–111)
Creatinine: 1.76 mg/dL — ABNORMAL HIGH (ref 0.61–1.24)
GFR, Estimated: 40 mL/min — ABNORMAL LOW (ref >60)
Glucose, Bld: 104 mg/dL — ABNORMAL HIGH (ref 70–99)
Potassium: 4.5 mmol/L (ref 3.5–5.1)
Sodium: 133 mmol/L — ABNORMAL LOW (ref 135–145)
Total Bilirubin: 0.9 mg/dL (ref 0.3–1.2)
Total Protein: 6.4 g/dL — ABNORMAL LOW (ref 6.5–8.1)

## 2022-06-15 LAB — IRON AND IRON BINDING CAPACITY (CC-WL,HP ONLY)
Iron: 109 ug/dL (ref 45–182)
Saturation Ratios: 24 % (ref 17.9–39.5)
TIBC: 447 ug/dL (ref 250–450)
UIBC: 338 ug/dL

## 2022-06-15 LAB — RETIC PANEL
Immature Retic Fract: 18.4 % — ABNORMAL HIGH (ref 2.3–15.9)
RBC.: 3.22 MIL/uL — ABNORMAL LOW (ref 4.22–5.81)
Retic Count, Absolute: 54.1 10*3/uL (ref 19.0–186.0)
Retic Ct Pct: 1.7 % (ref 0.4–3.1)
Reticulocyte Hemoglobin: 36.2 pg (ref >27.9)

## 2022-06-15 LAB — FOLATE: Folate: 13.3 ng/mL (ref >5.9)

## 2022-06-15 LAB — VITAMIN B12: Vitamin B-12: 1173 pg/mL — ABNORMAL HIGH (ref 180–914)

## 2022-06-15 LAB — LACTATE DEHYDROGENASE: LDH: 179 U/L (ref 98–192)

## 2022-06-15 NOTE — Progress Notes (Signed)
[place] [phone]   Fax:(336) [place] NOTE  Patient Care Team: Donnajean Lopes, MD as PCP - General (Internal Medicine) Evans Lance, MD as PCP - Cardiology (Cardiology) Donnajean Lopes, MD (Internal Medicine)  Hematological/Oncological History # Pancytopenia 05/12/2022: WBC 3.09, Hgb 11.6, MCV 95.5, Plt 103 06/15/2022: establish care with Dr. Lorenso Courier   CHIEF COMPLAINTS/PURPOSE OF CONSULTATION:  "Pancytopenia "  HISTORY OF PRESENTING ILLNESS:  Joshua [name] 70 y.o. male with medical history significant for atrial fibrillation, CAD, type 2 diabetes, gout, hypertension, and hyperlipidemia who presents for evaluation of pancytopenia.  On review of the previous records Joshua [name] had labs collected on 05/12/2022 which showed white blood cell count 3.09, hemoglobin 11.6, MCV 95.5, and platelets of 103.  Due to concern for this pancytopenia the patient was referred to hematology for further evaluation and management.  On exam today Joshua [name] is accompanied by his wife.  He reports that he has had no problems recently in his health.  He reports that he has been taking vitamin B12 pills at the recommendation of his physician.  He reports that he has not been having any issues with bleeding, bruising, or dark stools though he notes that it does not take much to scrape the skin and cause bleeding.  He notes that he is on Pradaxa but no baby aspirin.  He currently does have a conjunctival hemorrhage on his right eye and reports that it showed up after mowing the lawn.  He thinks it may have been due to dust" rubbing of his eye".  He reports that he is also taking blood pressure medication at this time.  He is doing his best to lose weight and is currently at 253 pounds down from 272 pounds in February 2023.  He reports he is not on any special diets though he does not eat red meat.  He reports he "loves venison" and has not had any for a  while.  On further discussion he notes that he has undergone testing of stool cards but has not recently seen the results.  He reports that he has been having black/greenish stools which have been of watery consistency lately.  He reports that his family history is remarkable for Alzheimer's disease in his mother and congestive heart failure in his father.  He reports his brother had a cancer but he is unsure what kind.  He has 2 children 1 of whom is a daughter who had cervical cancer.  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  He reports that he feels well overall.  His appetite is strong he notes he could "eat a cow".  He notes he is not having any fevers, chills, sweats, nausea, vomiting or diarrhea.  A full 10 point ROS is listed below.  MEDICAL HISTORY:  Past Medical History:  Diagnosis Date   Arthritis    Atrial fibrillation (HCC)    Atrial flutter (HCC)    Back pain    CAD (coronary artery disease)    stents placed 3474   Complication of anesthesia    woke up during surgery and ablation   DM (diabetes mellitus) ([place])    type 2   Elevated PSA    Gout    Hearing problem    HTN (hypertension)    Hyperlipidemia    Myocardial infarct, old 2010   Prostatitis  Renal insufficiency     SURGICAL HISTORY: Past Surgical History:  Procedure Laterality Date   CARDIAC CATHETERIZATION  11/07/07   with 2 stents   COLONOSCOPY     Electrophysiologic study and RF catheter ablation   of AV node reentrant tachycardia.     EXTERNAL EAR SURGERY Right    cartilage   LEFT AND RIGHT HEART CATHETERIZATION WITH CORONARY/GRAFT ANGIOGRAM N/A 09/27/2014   Procedure: LEFT AND RIGHT HEART CATHETERIZATION WITH Beatrix Fetters;  Surgeon: Sinclair Grooms, MD;  Location: [facility] CATH LAB;  Service: Cardiovascular;  Laterality: N/A;   LUMBAR LAMINECTOMY/DECOMPRESSION MICRODISCECTOMY Left 04/29/2018   Procedure: Left Lumbar  Four-Five Laminectomy/Foraminotomy;  Surgeon: Eustace Moore, MD;  Location: [facility];  Service: Neurosurgery;  Laterality: Left;  Left Lumbar Four-Five Laminectomy/Foraminotomy   TOOTH EXTRACTION     VASECTOMY      SOCIAL HISTORY: Social History   Socioeconomic History   Marital status: Married    Spouse name: Not on file   Number of children: 2   Years of education: Not on file   Highest education level: Not on file  Occupational History   Occupation: SERV TECH    Employer: [employer]  Tobacco Use   Smoking status: Never   Smokeless tobacco: Former    Types: Chew  Substance and Sexual Activity   Alcohol use: No   Drug use: No   Sexual activity: Not on file  Other Topics Concern   Not on file  Social History Narrative   Not on file   Social Determinants of Health   Financial Resource Strain: Not on file  Food Insecurity: Not on file  Transportation Needs: Not on file  Physical Activity: Not on file  Stress: Not on file  Social Connections: Not on file  Intimate Partner Violence: Not on file    FAMILY HISTORY: Family History  Problem Relation Age of Onset   Hypertension Mother    Gout Mother    Alzheimer's disease Mother    Heart failure Father    Heart disease Brother    Cancer Brother        type unknown    ALLERGIES:  is allergic to desipramine and niacin.  MEDICATIONS:  Current Outpatient Medications  Medication Sig Dispense Refill   carvedilol (COREG) 12.5 MG tablet Take 1 tablet (12.5 mg total) by mouth 2 (two) times daily. 180 tablet 3   colchicine 0.6 MG tablet 1 tablet Orally twice daily for 10 days     cyanocobalamin 1000 MCG tablet Take 1,000 mcg by mouth daily.     ipratropium (ATROVENT) 0.06 % nasal spray take 1-2 sprays into each nostril up to 3 times daily as needed Nasal     lisinopril (ZESTRIL) 40 MG tablet Take 40 mg by mouth daily.     dabigatran (PRADAXA) 150 MG CAPS capsule Take 1 capsule (150 mg total) by mouth 2 (two) times daily. 180  capsule 3   fenofibrate 160 MG tablet Take 160 mg by mouth daily.     nitroGLYCERIN (NITROSTAT) 0.4 MG SL tablet Place 0.4 mg under the tongue every 5 (five) minutes x 3 doses as needed for chest pain.     pioglitazone (ACTOS) 45 MG tablet Take 45 mg by mouth every evening.      rosuvastatin (CRESTOR) 20 MG tablet Take 20 mg by mouth daily.     No current facility-administered medications for this visit.    REVIEW OF SYSTEMS:   Constitutional: ( - )  fevers, ( - )  chills , ( - ) night sweats Eyes: ( - ) blurriness of vision, ( - ) double vision, ( - ) watery eyes Ears, nose, mouth, throat, and face: ( - ) mucositis, ( - ) sore throat Respiratory: ( - ) cough, ( - ) dyspnea, ( - ) wheezes Cardiovascular: ( - ) palpitation, ( - ) chest discomfort, ( - ) lower extremity swelling Gastrointestinal:  ( - ) nausea, ( - ) heartburn, ( - ) change in bowel habits Skin: ( - ) abnormal skin rashes Lymphatics: ( - ) new lymphadenopathy, ( - ) easy bruising Neurological: ( - ) numbness, ( - ) tingling, ( - ) new weaknesses Behavioral/Psych: ( - ) mood change, ( - ) new changes  All other systems were reviewed with the patient and are negative.  PHYSICAL EXAMINATION:  Vitals:   06/15/22 1346  BP: (!) 117/52  Pulse: 66  Resp: 14  Temp: 97.6 F (36.4 C)  SpO2: 99%   Filed Weights   06/15/22 1346  Weight: 253 lb 11.2 oz (115.1 kg)    GENERAL: well appearing elderly Caucasian male in NAD  SKIN: skin color, texture, turgor are normal, no rashes or significant lesions EYES: conjunctiva are pink and non-injected, sclera clear LUNGS: clear to auscultation and percussion with normal breathing effort HEART: regular rate & rhythm and no murmurs and no lower extremity edema Musculoskeletal: no cyanosis of digits and no clubbing  PSYCH: alert & oriented x 3, fluent speech NEURO: no focal motor/sensory deficits  LABORATORY DATA:  I have reviewed the data as listed    Latest Ref Rng & Units  06/15/2022    3:26 PM 04/26/2018    9:01 AM 06/23/2016    3:44 PM  CBC  WBC 4.0 - 10.5 K/uL 3.3  5.4  7.9   Hemoglobin 13.0 - 17.0 g/dL 10.2  15.0  14.3   Hematocrit 39.0 - 52.0 % 31.1  48.6  43.9   Platelets 150 - 400 K/uL 96  218  189        Latest Ref Rng & Units 06/15/2022    3:26 PM 04/26/2018    9:01 AM 06/23/2016    3:44 PM  CMP  Glucose 70 - 99 mg/dL 104  132  124   BUN 8 - 23 mg/dL 64  30  28   Creatinine 0.61 - 1.24 mg/dL 1.76  1.29  1.28   Sodium 135 - 145 mmol/L 133  142  139   Potassium 3.5 - 5.1 mmol/L 4.5  4.3  4.6   Chloride 98 - 111 mmol/L 107  108  107   CO2 22 - 32 mmol/L '17  24  21   ' Calcium 8.9 - 10.3 mg/dL 9.3  9.8  10.8   Total Protein 6.5 - 8.1 g/dL 6.4     Total Bilirubin 0.3 - 1.2 mg/dL 0.9     Alkaline Phos 38 - 126 U/L 22     AST 15 - 41 U/L 32     ALT 0 - 44 U/L 15        ASSESSMENT & PLAN  Joshua [name] 71 y.o. male with medical history significant for atrial fibrillation, CAD, type 2 diabetes, gout, hypertension, and hyperlipidemia who presents for evaluation of pancytopenia.  After review of the labs, review of the records, and discussion with the patient the patients findings are most consistent with pancytopenia of unclear etiology.  At this time we  will conduct a full work-up to include nutritional evaluation, bone marrow dysfunction, multiple myeloma rule out, and liver disease evaluation.  In the event all of these are negative we will need to pursue a bone marrow biopsy.  Joshua [name] and his wife voiced understanding of the plan moving forward.  # Pancytopenia --We will order vitamin B12, folate, methylmalonic acid, and homocystine. -- Will order ultrasound liver to assess for liver disease. -- Multiple myeloma rule out with SPEP and serum free light chains -- If our initial work-up is negative we will pursue testing for hepatitis B, hepatitis C, HIV, and copper. -- Additionally if blood work is unrevealing can consider bone marrow  biopsy. -- Return to clinic placeholder visit in 3 months time  Orders Placed This Encounter  Procedures   US Abdomen Complete    Standing Status:   Future    Standing Expiration Date:   06/15/2023    Order Specific Question:   Reason for Exam (SYMPTOM  OR DIAGNOSIS REQUIRED)    Answer:   pancytopenia, assess for splenomegaly/liver disease    Order Specific Question:   Preferred imaging location?    Answer:   [facility]   CBC with Differential (Cancer Center Only)    Standing Status:   Future    Number of Occurrences:   1    Standing Expiration Date:   06/16/2023   CMP ([facility] only)    Standing Status:   Future    Number of Occurrences:   1    Standing Expiration Date:   06/16/2023   Ferritin    Standing Status:   Future    Number of Occurrences:   1    Standing Expiration Date:   06/16/2023   Iron and Iron Binding Capacity ([facility] only)    Standing Status:   Future    Number of Occurrences:   1    Standing Expiration Date:   06/16/2023   Retic Panel    Standing Status:   Future    Number of Occurrences:   1    Standing Expiration Date:   06/16/2023   Lactate dehydrogenase (LDH)    Standing Status:   Future    Number of Occurrences:   1    Standing Expiration Date:   06/15/2023   Vitamin B12    Standing Status:   Future    Number of Occurrences:   1    Standing Expiration Date:   06/15/2023   Folate, Serum    Standing Status:   Future    Number of Occurrences:   1    Standing Expiration Date:   06/15/2023   Methylmalonic acid, serum    Standing Status:   Future    Number of Occurrences:   1    Standing Expiration Date:   06/15/2023   Multiple Myeloma Panel (SPEP&IFE w/QIG)    Standing Status:   Future    Number of Occurrences:   1    Standing Expiration Date:   06/15/2023   Kappa/lambda light chains    Standing Status:   Future    Number of Occurrences:   1    Standing Expiration Date:   06/15/2023    All questions were answered. The patient knows to  call the clinic with any problems, questions or concerns.  A total of more than 60 minutes were spent on this encounter with face-to-face time and non-face-to-face time, including preparing to see the patient, ordering tests and/or medications,  counseling the patient and coordination of care as outlined above.   Ledell Peoples, MD Department of Hematology/Oncology [place] [facility] Phone: [phone] Pager: [phone] Email: [email or web address]  06/16/2022 4:52 PM

## 2022-06-16 LAB — FERRITIN: Ferritin: 123 ng/mL (ref 24–336)

## 2022-06-17 ENCOUNTER — Ambulatory Visit (HOSPITAL_COMMUNITY)
Admission: RE | Admit: 2022-06-17 | Discharge: 2022-06-17 | Disposition: A | Discharge status: Home / Self Care | Payer: Medicare HMO | Source: Ambulatory Visit | Attending: Hematology and Oncology | Admitting: Hematology and Oncology

## 2022-06-17 ENCOUNTER — Telehealth: Payer: Self-pay | Admitting: Hematology and Oncology

## 2022-06-17 DIAGNOSIS — D61818 Other pancytopenia: Secondary | ICD-10-CM | POA: Insufficient documentation

## 2022-06-17 DIAGNOSIS — K8020 Calculus of gallbladder without cholecystitis without obstruction: Secondary | ICD-10-CM | POA: Diagnosis not present

## 2022-06-17 NOTE — Telephone Encounter (Signed)
Per 9/18 los called and spoke to pt about appointment

## 2022-06-19 LAB — MULTIPLE MYELOMA PANEL, SERUM
Albumin SerPl Elph-Mcnc: 3.9 g/dL (ref 2.9–4.4)
Albumin/Glob SerPl: 1.7 (ref 0.7–1.7)
Alpha 1: 0.2 g/dL (ref 0.0–0.4)
Alpha2 Glob SerPl Elph-Mcnc: 0.6 g/dL (ref 0.4–1.0)
B-Globulin SerPl Elph-Mcnc: 0.9 g/dL (ref 0.7–1.3)
Gamma Glob SerPl Elph-Mcnc: 0.6 g/dL (ref 0.4–1.8)
Globulin, Total: 2.3 g/dL (ref 2.2–3.9)
IgA: 155 mg/dL (ref 61–437)
IgG (Immunoglobin G), Serum: 785 mg/dL (ref 603–1613)
IgM (Immunoglobulin M), Srm: 14 mg/dL — ABNORMAL LOW (ref 15–143)
Total Protein ELP: 6.2 g/dL (ref 6.0–8.5)

## 2022-06-20 ENCOUNTER — Other Ambulatory Visit: Payer: Self-pay | Admitting: Internal Medicine

## 2022-06-23 LAB — KAPPA/LAMBDA LIGHT CHAINS
Kappa free light chain: 32.2 mg/L — ABNORMAL HIGH (ref 3.3–19.4)
Kappa, lambda light chain ratio: 1.53 (ref 0.26–1.65)
Lambda free light chains: 21 mg/L (ref 5.7–26.3)

## 2022-06-23 LAB — METHYLMALONIC ACID, SERUM: Methylmalonic Acid, Quantitative: 121 nmol/L (ref 0–378)

## 2022-06-24 ENCOUNTER — Telehealth: Payer: Self-pay | Admitting: *Deleted

## 2022-06-24 NOTE — Telephone Encounter (Signed)
-----  Message from Orson Slick, MD sent at 06/24/2022  1:09 PM EDT ----- Please let Joshua [name] know that his lab work and abdominal ultrasound did not show a clear reason for his low hemoglobin and platelets.  At this time I would recommend pursuing a bone marrow biopsy in order to find the etiology.  If he is agreeable we will place this order and see him back shortly after the bone marrow biopsy.  ----- Message ----- From: Interface, Lab In [place] Sent: 06/15/2022   3:35 PM EDT To: Orson Slick, MD

## 2022-06-24 NOTE — Telephone Encounter (Signed)
TCT patient regarding recent lab results. Spoke with him. Advised that Dr. Lorenso Courier and [place] did not show a clear reason for his low HGB and platelets. Dr. Lorenso Courier recommends a bone marrow biopsy to assist in finding the etiology of his low blood counts.  Pt voiced understanding. He is agreeable to having the bone marrow biopsy.  Dr. Lorenso Courier made aware.

## 2022-07-09 ENCOUNTER — Other Ambulatory Visit: Payer: Self-pay | Admitting: Hematology and Oncology

## 2022-07-09 DIAGNOSIS — D61818 Other pancytopenia: Secondary | ICD-10-CM

## 2022-07-15 DIAGNOSIS — I480 Paroxysmal atrial fibrillation: Secondary | ICD-10-CM | POA: Diagnosis not present

## 2022-07-15 DIAGNOSIS — I87332 Chronic venous hypertension (idiopathic) with ulcer and inflammation of left lower extremity: Secondary | ICD-10-CM | POA: Diagnosis not present

## 2022-07-15 DIAGNOSIS — I1310 Hypertensive heart and chronic kidney disease without heart failure, with stage 1 through stage 4 chronic kidney disease, or unspecified chronic kidney disease: Secondary | ICD-10-CM | POA: Diagnosis not present

## 2022-07-15 DIAGNOSIS — E1121 Type 2 diabetes mellitus with diabetic nephropathy: Secondary | ICD-10-CM | POA: Diagnosis not present

## 2022-07-20 DIAGNOSIS — I1310 Hypertensive heart and chronic kidney disease without heart failure, with stage 1 through stage 4 chronic kidney disease, or unspecified chronic kidney disease: Secondary | ICD-10-CM | POA: Diagnosis not present

## 2022-07-20 DIAGNOSIS — I87332 Chronic venous hypertension (idiopathic) with ulcer and inflammation of left lower extremity: Secondary | ICD-10-CM | POA: Diagnosis not present

## 2022-07-27 DIAGNOSIS — I1310 Hypertensive heart and chronic kidney disease without heart failure, with stage 1 through stage 4 chronic kidney disease, or unspecified chronic kidney disease: Secondary | ICD-10-CM | POA: Diagnosis not present

## 2022-07-27 DIAGNOSIS — I87332 Chronic venous hypertension (idiopathic) with ulcer and inflammation of left lower extremity: Secondary | ICD-10-CM | POA: Diagnosis not present

## 2022-08-06 ENCOUNTER — Other Ambulatory Visit: Payer: Self-pay | Admitting: Internal Medicine

## 2022-08-06 ENCOUNTER — Other Ambulatory Visit (HOSPITAL_COMMUNITY): Payer: Self-pay | Admitting: Physician Assistant

## 2022-08-06 DIAGNOSIS — D759 Disease of blood and blood-forming organs, unspecified: Secondary | ICD-10-CM

## 2022-08-06 NOTE — H&P (Signed)
Chief Complaint: Patient was seen in consultation today for Pancytopenia at the request of Joshua [name]  Referring Physician(s): Joshua [name]  Supervising Physician: Joshua [name]  Patient Status: [facility] - Out-pt  History of Present Illness: Joshua [name] is a 71 y.o. male outpatient.  He has a medical history significant for atrial fibrillation, CAD, type 2 diabetes, gout, hypertension, and hyperlipidemia .  August labs showed WBC 3.09, Hgb 11.6, plt103, and MCV 95.5 concerning for pancytopenia.  He has established care with Dr. Lorenso [name] at the [facility].  He reports that he feels well overall.  His appetite is strong.  He notes he is not having any fevers, chills, sweats, nausea, vomiting or diarrhea.  As part of a full work-up Mr Joshua [name] was referred to IR for bone marrow biopsy.    Past Medical History:  Diagnosis Date   Arthritis    Atrial fibrillation (HCC)    Atrial flutter (HCC)    Back pain    CAD (coronary artery disease)    stents placed 4008   Complication of anesthesia    woke up during surgery and ablation   DM (diabetes mellitus) (Joshua [name])    type 2   Elevated PSA    Gout    Hearing problem    HTN (hypertension)    Hyperlipidemia    Myocardial infarct, old 2010   Prostatitis    Renal insufficiency     Past Surgical History:  Procedure Laterality Date   CARDIAC CATHETERIZATION  11/07/07   with 2 stents   COLONOSCOPY     Electrophysiologic study and RF catheter ablation   of AV node reentrant tachycardia.     EXTERNAL EAR SURGERY Right    cartilage   LEFT AND RIGHT HEART CATHETERIZATION WITH CORONARY/GRAFT ANGIOGRAM N/A 09/27/2014   Procedure: LEFT AND RIGHT HEART CATHETERIZATION WITH Joshua [name];  Surgeon: Joshua Grooms, MD;  Location: [facility] CATH LAB;  Service: Cardiovascular;  Laterality: N/A;   LUMBAR LAMINECTOMY/DECOMPRESSION MICRODISCECTOMY Left 04/29/2018   Procedure: Left Lumbar Four-Five Laminectomy/Foraminotomy;  Surgeon: Joshua Moore, MD;  Location: [place];  Service: Neurosurgery;  Laterality: Left;  Left Lumbar Four-Five Laminectomy/Foraminotomy   TOOTH EXTRACTION     VASECTOMY      Allergies: Desipramine and Niacin  Medications: Prior to Admission medications   Medication Sig Start Date End Date Taking? Authorizing Provider  carvedilol (COREG) 12.5 MG tablet TAKE 1 TABLET BY MOUTH 2 TIMES DAILY. 06/22/22   Joshua Lance, MD  colchicine 0.6 MG tablet 1 tablet Orally twice daily for 10 days 07/16/20   [provider]  cyanocobalamin 1000 MCG tablet Take 1,000 mcg by mouth daily.    [provider]  dabigatran (PRADAXA) 150 MG CAPS capsule Take 1 capsule (150 mg total) by mouth 2 (two) times daily. 09/19/21   Joshua Lance, MD  fenofibrate 160 MG tablet Take 160 mg by mouth daily. 06/16/20   [provider]  ipratropium (ATROVENT) 0.06 % nasal spray take 1-2 sprays into each nostril up to 3 times daily as needed Nasal 05/29/19   [provider]  lisinopril (ZESTRIL) 40 MG tablet Take 40 mg by mouth daily.    [provider]  nitroGLYCERIN (NITROSTAT) 0.4 MG SL tablet Place 0.4 mg under the tongue every 5 (five) minutes x 3 doses as needed for chest pain.    [provider]  pioglitazone (ACTOS) 45 MG tablet Take 45 mg by mouth every evening.  [provider]  rosuvastatin (CRESTOR) 20 MG tablet Take 20 mg by mouth daily. 03/30/20   [provider]     Family History  Problem Relation Age of Onset   Hypertension Mother    Gout Mother    Alzheimer's disease Mother    Heart failure Father    Heart disease Brother    Cancer Brother        type unknown    Social History   Socioeconomic History   Marital status: Married    Spouse name: Not on file   Number of children: 2   Years of education: Not on file   Highest education level: Not on file  Occupational History   Occupation: SERV TECH    Employer: [employer]  Tobacco Use    Smoking status: Never   Smokeless tobacco: Former    Types: Chew  Substance and Sexual Activity   Alcohol use: No   Drug use: No   Sexual activity: Not on file  Other Topics Concern   Not on file  Social History Narrative   Not on file   Social Determinants of Health   Financial Resource Strain: Not on file  Food Insecurity: Not on file  Transportation Needs: Not on file  Physical Activity: Not on file  Stress: Not on file  Social Connections: Not on file    Review of Systems: A 12 point ROS discussed and pertinent positives are indicated in the HPI above.  All other systems are negative.  Review of Systems denies fever,HA,CP,dyspnea, cough, abd pain, N/V or bleeding; does have back pain, frequent urination  Vital Signs:  BP 177/77, HR 68, temp 97.5, O2 sats 95% RA  Physical Exam : awake/alert; chest- CTA bilat; heart- RRR; abd- obese, soft,+BS,NT; bilat LE edema noted  Imaging: No results found.  Labs:  CBC: Recent Labs    06/15/22 1526  WBC 3.3*  HGB 10.2*  HCT 31.1*  PLT 96*    COAGS: No results for input(s): "INR", "APTT" in the last 8760 hours.  BMP: Recent Labs    06/15/22 1526  NA 133*  K 4.5  CL 107  CO2 17*  GLUCOSE 104*  BUN 64*  CALCIUM 9.3  CREATININE 1.76*  GFRNONAA 40*    LIVER FUNCTION TESTS: Recent Labs    06/15/22 1526  BILITOT 0.9  AST 32  ALT 15  ALKPHOS 22*  PROT 6.4*  ALBUMIN 4.1    Assessment and Plan: 71 y.o. male outpatient.  He has a medical history significant for atrial fibrillation, CAD, type 2 diabetes, gout, hypertension, and hyperlipidemia .  August labs showed WBC 3.09, Hgb 11.6, plt103, and MCV 95.5 concerning for pancytopenia.  He has established care with Dr. Lorenso [name] at the [facility].  He reports that he feels well overall.  His appetite is strong.  He notes he is not having any fevers, chills, sweats, nausea, vomiting or diarrhea.  As part of a full work-up Mr Joshua [name] was referred to IR for bone  marrow biopsy.Risks and benefits of procedure was discussed with the patient  including, but not limited to bleeding, infection, damage to adjacent structures or low yield requiring additional tests.  All of the questions were answered and there is agreement to proceed.  Consent signed and in chart.    Thank you for this interesting consult.  I greatly enjoyed meeting ROMOLO SIELING and look forward to participating in their care.  A copy of this report was sent  to the requesting provider on this date.  Electronically Signed: Pasty Spillers, PA/Kevin Brinlee Gambrell,PA-C 08/06/2022, 12:10 PM   I spent a total of 20 minutes  in face to face in clinical consultation, greater than 50% of which was counseling/coordinating care for CT guided bone marrow biopsy

## 2022-08-07 ENCOUNTER — Ambulatory Visit (HOSPITAL_COMMUNITY)
Admission: RE | Admit: 2022-08-07 | Discharge: 2022-08-07 | Disposition: A | Discharge status: Home / Self Care | Payer: Medicare HMO | Source: Ambulatory Visit | Attending: Hematology and Oncology | Admitting: Hematology and Oncology

## 2022-08-07 ENCOUNTER — Encounter (HOSPITAL_COMMUNITY): Payer: Self-pay

## 2022-08-07 DIAGNOSIS — I10 Essential (primary) hypertension: Secondary | ICD-10-CM | POA: Diagnosis not present

## 2022-08-07 DIAGNOSIS — D759 Disease of blood and blood-forming organs, unspecified: Secondary | ICD-10-CM | POA: Diagnosis not present

## 2022-08-07 DIAGNOSIS — Z1379 Encounter for other screening for genetic and chromosomal anomalies: Secondary | ICD-10-CM | POA: Insufficient documentation

## 2022-08-07 DIAGNOSIS — E118 Type 2 diabetes mellitus with unspecified complications: Secondary | ICD-10-CM | POA: Diagnosis not present

## 2022-08-07 DIAGNOSIS — D61818 Other pancytopenia: Secondary | ICD-10-CM | POA: Diagnosis not present

## 2022-08-07 DIAGNOSIS — E785 Hyperlipidemia, unspecified: Secondary | ICD-10-CM | POA: Insufficient documentation

## 2022-08-07 DIAGNOSIS — M109 Gout, unspecified: Secondary | ICD-10-CM | POA: Diagnosis not present

## 2022-08-07 DIAGNOSIS — D696 Thrombocytopenia, unspecified: Secondary | ICD-10-CM | POA: Diagnosis not present

## 2022-08-07 DIAGNOSIS — I2510 Atherosclerotic heart disease of native coronary artery without angina pectoris: Secondary | ICD-10-CM | POA: Diagnosis not present

## 2022-08-07 DIAGNOSIS — I4891 Unspecified atrial fibrillation: Secondary | ICD-10-CM | POA: Insufficient documentation

## 2022-08-07 LAB — CBC WITH DIFFERENTIAL/PLATELET
Abs Immature Granulocytes: 0.02 10*3/uL (ref 0.00–0.07)
Basophils Absolute: 0 10*3/uL (ref 0.0–0.1)
Basophils Relative: 1 %
Eosinophils Absolute: 0.1 10*3/uL (ref 0.0–0.5)
Eosinophils Relative: 2 %
HCT: 36.1 % — ABNORMAL LOW (ref 39.0–52.0)
Hemoglobin: 11.2 g/dL — ABNORMAL LOW (ref 13.0–17.0)
Immature Granulocytes: 1 %
Lymphocytes Relative: 23 %
Lymphs Abs: 0.7 10*3/uL (ref 0.7–4.0)
MCH: 31.1 pg (ref 26.0–34.0)
MCHC: 31 g/dL (ref 30.0–36.0)
MCV: 100.3 fL — ABNORMAL HIGH (ref 80.0–100.0)
Monocytes Absolute: 0.3 10*3/uL (ref 0.1–1.0)
Monocytes Relative: 9 %
Neutro Abs: 1.9 10*3/uL (ref 1.7–7.7)
Neutrophils Relative %: 64 %
Platelets: 114 10*3/uL — ABNORMAL LOW (ref 150–400)
RBC: 3.6 MIL/uL — ABNORMAL LOW (ref 4.22–5.81)
RDW: 17.7 % — ABNORMAL HIGH (ref 11.5–15.5)
WBC: 2.9 10*3/uL — ABNORMAL LOW (ref 4.0–10.5)
nRBC: 0 % (ref 0.0–0.2)

## 2022-08-07 MED ORDER — MIDAZOLAM HCL 2 MG/2ML IJ SOLN
INTRAMUSCULAR | Status: AC | PRN
  Administered 2022-08-07: 1 mg via INTRAVENOUS

## 2022-08-07 MED ORDER — FENTANYL CITRATE (PF) 100 MCG/2ML IJ SOLN
INTRAMUSCULAR | Status: AC
  Filled 2022-08-07: qty 6

## 2022-08-07 MED ORDER — ONDANSETRON HCL 4 MG/2ML IJ SOLN
INTRAMUSCULAR | Status: AC
  Filled 2022-08-07: qty 2

## 2022-08-07 MED ORDER — SODIUM CHLORIDE 0.9 % IV SOLN: INTRAVENOUS | Status: DC

## 2022-08-07 MED ORDER — ONDANSETRON HCL 4 MG/2ML IJ SOLN
INTRAMUSCULAR | Status: AC | PRN
  Administered 2022-08-07: 4 mg via INTRAVENOUS

## 2022-08-07 MED ORDER — FENTANYL CITRATE (PF) 100 MCG/2ML IJ SOLN
INTRAMUSCULAR | Status: AC | PRN
  Administered 2022-08-07 (×3): 50 ug via INTRAVENOUS

## 2022-08-07 MED ORDER — MIDAZOLAM HCL 2 MG/2ML IJ SOLN
INTRAMUSCULAR | Status: AC
  Filled 2022-08-07: qty 4

## 2022-08-07 NOTE — Procedures (Signed)
Interventional Radiology Procedure Note  Indication: Pancytopenia  Procedure: CT guided aspirate and core biopsy of right iliac bone  Complications: None  Bleeding: Minimal  Joshua Hack, MD [phone]   

## 2022-08-07 NOTE — Discharge Instructions (Signed)
For questions /concerns may call Interventional Radiology at [phone] or  Interventional Radiology clinic [phone]   You may remove your dressing and shower tomorrow afternoon    Moderate Conscious Sedation, Adult, Care After This sheet gives you information about how to care for yourself after your procedure. Your health care provider may also give you more specific instructions. If you have problems or questions, contact your health careprovider. What can I expect after the procedure? After the procedure, it is common to have: Sleepiness for several hours. Impaired judgment for several hours. Difficulty with balance. Vomiting if you eat too soon. Follow these instructions at home: For the time period you were told by your health care provider: Rest. Do not participate in activities where you could fall or become injured. Do not drive or use machinery. Do not drink alcohol. Do not take sleeping pills or medicines that cause drowsiness. Do not make important decisions or sign legal documents. Do not take care of children on your own. Eating and drinking  Follow the diet recommended by your health care provider. Drink enough fluid to keep your urine pale yellow. If you vomit: Drink water, juice, or soup when you can drink without vomiting. Make sure you have little or no nausea before eating solid foods.  General instructions Take over-the-counter and prescription medicines only as told by your health care provider. Have a responsible adult stay with you for the time you are told. It is important to have someone help care for you until you are awake and alert. Do not smoke. Keep all follow-up visits as told by your health care provider. This is important. Contact a health care provider if: You are still sleepy or having trouble with balance after 24 hours. You feel light-headed. You keep feeling nauseous or you keep vomiting. You develop a rash. You have a fever. You  have redness or swelling around the IV site. Get help right away if: You have trouble breathing. You have new-onset confusion at home. Summary After the procedure, it is common to feel sleepy, have impaired judgment, or feel nauseous if you eat too soon. Rest after you get home. Know the things you should not do after the procedure. Follow the diet recommended by your health care provider and drink enough fluid to keep your urine pale yellow. Get help right away if you have trouble breathing or new-onset confusion at home. This information is not    Bone Marrow Aspiration and Bone Marrow Biopsy, Adult, Care After This sheet gives you information about how to care for yourself after your procedure. Your health care provider may also give you more specific instructions. If you have problems or questions, contact your health careprovider. What can I expect after the procedure? After the procedure, it is common to have: Mild pain and tenderness. Swelling. Bruising. Follow these instructions at home: Puncture site care Follow instructions from your health care provider about how to take care of the puncture site. Make sure you: Wash your hands with soap and water before and after you change your bandage (dressing). If soap and water are not available, use hand sanitizer. Change your dressing as told by your health care provider. Check your puncture site every day for signs of infection. Check for: More redness, swelling, or pain. Fluid or blood. Warmth. Pus or a bad smell.  Activity Return to your normal activities as told by your health care provider. Ask your health care provider what activities are safe for you. Do not  lift anything that is heavier than 10 lb (4.5 kg), or the limit that you are told, until your health care provider says that it is safe. Do not drive for 24 hours if you were given a sedative during your procedure. General instructions Take over-the-counter and  prescription medicines only as told by your health care provider. Do not take baths, swim, or use a hot tub until your health care provider approves. Ask your health care provider if you may take showers. You may only be allowed to take sponge baths. If directed, put ice on the affected area. To do this: Put ice in a plastic bag. Place a towel between your skin and the bag. Leave the ice on for 20 minutes, 2-3 times a day. Keep all follow-up visits as told by your health care provider. This is important.  Contact a health care provider if: Your pain is not controlled with medicine. You have a fever. You have more redness, swelling, or pain around the puncture site. You have fluid or blood coming from the puncture site. Your puncture site feels warm to the touch. You have pus or a bad smell coming from the puncture site. Summary After the procedure, it is common to have mild pain, tenderness, swelling, and bruising. Follow instructions from your health care provider about how to take care of the puncture site and what activities are safe for you. Take over-the-counter and prescription medicines only as told by your health care provider. Contact a health care provider if you have any signs of infection, such as fluid or blood coming from the puncture site. This information is not intended to replace advice given to you by your health care provider. Make sure you discuss any questions you have with your healthcare provider. Document Revised: 01/31/2019 Document Reviewed: 01/31/2019 Elsevier Patient Education  [place].

## 2022-08-09 DIAGNOSIS — D61818 Other pancytopenia: Secondary | ICD-10-CM | POA: Diagnosis not present

## 2022-08-09 DIAGNOSIS — D696 Thrombocytopenia, unspecified: Secondary | ICD-10-CM | POA: Diagnosis not present

## 2022-08-10 LAB — SURGICAL PATHOLOGY

## 2022-08-17 ENCOUNTER — Encounter (HOSPITAL_COMMUNITY): Payer: Self-pay | Admitting: Hematology and Oncology

## 2022-09-13 ENCOUNTER — Other Ambulatory Visit: Payer: Self-pay | Admitting: Hematology and Oncology

## 2022-09-13 DIAGNOSIS — D61818 Other pancytopenia: Secondary | ICD-10-CM

## 2022-09-13 NOTE — Progress Notes (Unsigned)
[place] [phone]   [phone]  PROGRESS NOTE  Patient Care Team: Donnajean Lopes, MD as PCP - General (Internal Medicine) Evans Lance, MD as PCP - Cardiology (Cardiology) Donnajean Lopes, MD (Internal Medicine)  Hematological/Oncological History # Pancytopenia, Suspect Myelodysplasia 05/12/2022: WBC 3.09, Hgb 11.6, MCV 95.5, Plt 103 06/15/2022: establish care with Dr. Lorenso Courier.  No clear etiology based off nutritional panel. 06/17/2022: Abdominal ultrasound performed, no problems with the liver or spleen. 08/07/2022: Bone marrow biopsy performed, showed no clear abnormalities within the bone marrow.   Interval History:  Joshua [name] 71 y.o. male with medical history significant for pancytopenia who presents for a follow up visit. The patient's last visit was on 06/15/2022. In the interim since the last visit he underwent a bone marrow biopsy and abdominal ultrasound neither of which showed a clear etiology for his pancytopenia.  On exam today Joshua [name] is accompanied by his wife.  He reports that his granddaughter will be visiting them for Christmas.  He reports of the bone marrow biopsy procedure was rough because he has bad back pain and had difficulty laying on the table.  He could not get comfortable.  He reports that unfortunately the pain medications did not "knock him out".  He notes that there was no pain with the procedure itself.  He reports that he is currently taking vitamin B12 500 mg daily and is not currently taking a multivitamin.  He reports he does take Tylenol for his back pain.  He has not been having any difficulties with fevers, chills, sweats, nausea, vomiting or diarrhea.  He denies any infectious symptoms.  A full 10 point ROS was otherwise negative.  The bulk of our discussion focused on the results of his pancytopenia workup.  Details of this conversation are noted below.  MEDICAL HISTORY:  Past Medical History:   Diagnosis Date   Arthritis    Atrial fibrillation (HCC)    Atrial flutter (HCC)    Back pain    CAD (coronary artery disease)    stents placed 5701   Complication of anesthesia    woke up during surgery and ablation   DM (diabetes mellitus) ([place])    type 2   Elevated PSA    Gout    Hearing problem    HTN (hypertension)    Hyperlipidemia    Myocardial infarct, old 2010   Prostatitis    Renal insufficiency     SURGICAL HISTORY: Past Surgical History:  Procedure Laterality Date   CARDIAC CATHETERIZATION  11/07/07   with 2 stents   COLONOSCOPY     Electrophysiologic study and RF catheter ablation   of AV node reentrant tachycardia.     EXTERNAL EAR SURGERY Right    cartilage   LEFT AND RIGHT HEART CATHETERIZATION WITH CORONARY/GRAFT ANGIOGRAM N/A 09/27/2014   Procedure: LEFT AND RIGHT HEART CATHETERIZATION WITH Beatrix Fetters;  Surgeon: Sinclair Grooms, MD;  Location: [facility] CATH LAB;  Service: Cardiovascular;  Laterality: N/A;   LUMBAR LAMINECTOMY/DECOMPRESSION MICRODISCECTOMY Left 04/29/2018   Procedure: Left Lumbar Four-Five Laminectomy/Foraminotomy;  Surgeon: Eustace Moore, MD;  Location: [place];  Service: Neurosurgery;  Laterality: Left;  Left Lumbar Four-Five Laminectomy/Foraminotomy   TOOTH EXTRACTION     VASECTOMY      SOCIAL HISTORY: Social History   Socioeconomic History   Marital status: Married    Spouse name: Not on file   Number of children: 2   Years of education: Not  on file   Highest education level: Not on file  Occupational History   Occupation: SERV TECH    Employer: [employer]  Tobacco Use   Smoking status: Never   Smokeless tobacco: Former    Types: Chew  Substance and Sexual Activity   Alcohol use: No   Drug use: No   Sexual activity: Not on file  Other Topics Concern   Not on file  Social History Narrative   Not on file   Social Determinants of Health   Financial Resource Strain: Not on file  Food Insecurity: Not on file   Transportation Needs: Not on file  Physical Activity: Not on file  Stress: Not on file  Social Connections: Not on file  Intimate Partner Violence: Not on file    FAMILY HISTORY: Family History  Problem Relation Age of Onset   Hypertension Mother    Gout Mother    Alzheimer's disease Mother    Heart failure Father    Heart disease Brother    Cancer Brother        type unknown    ALLERGIES:  is allergic to desipramine and niacin.  MEDICATIONS:  Current Outpatient Medications  Medication Sig Dispense Refill   carvedilol (COREG) 12.5 MG tablet TAKE 1 TABLET BY MOUTH 2 TIMES DAILY. 180 tablet 2   colchicine 0.6 MG tablet 1 tablet Orally twice daily for 10 days     cyanocobalamin 1000 MCG tablet Take 1,000 mcg by mouth daily.     dabigatran (PRADAXA) 150 MG CAPS capsule Take 1 capsule (150 mg total) by mouth 2 (two) times daily. 180 capsule 3   fenofibrate 160 MG tablet Take 160 mg by mouth daily.     ipratropium (ATROVENT) 0.06 % nasal spray take 1-2 sprays into each nostril up to 3 times daily as needed Nasal     lisinopril (ZESTRIL) 40 MG tablet Take 40 mg by mouth daily.     nitroGLYCERIN (NITROSTAT) 0.4 MG SL tablet Place 0.4 mg under the tongue every 5 (five) minutes x 3 doses as needed for chest pain.     pioglitazone (ACTOS) 45 MG tablet Take 45 mg by mouth every evening.      rosuvastatin (CRESTOR) 20 MG tablet Take 20 mg by mouth daily.     No current facility-administered medications for this visit.    REVIEW OF SYSTEMS:   Constitutional: ( - ) fevers, ( - )  chills , ( - ) night sweats Eyes: ( - ) blurriness of vision, ( - ) double vision, ( - ) watery eyes Ears, nose, mouth, throat, and face: ( - ) mucositis, ( - ) sore throat Respiratory: ( - ) cough, ( - ) dyspnea, ( - ) wheezes Cardiovascular: ( - ) palpitation, ( - ) chest discomfort, ( - ) lower extremity swelling Gastrointestinal:  ( - ) nausea, ( - ) heartburn, ( - ) change in bowel habits Skin: ( - )  abnormal skin rashes Lymphatics: ( - ) new lymphadenopathy, ( - ) easy bruising Neurological: ( - ) numbness, ( - ) tingling, ( - ) new weaknesses Behavioral/Psych: ( - ) mood change, ( - ) new changes  All other systems were reviewed with the patient and are negative.  PHYSICAL EXAMINATION: Vitals:   09/14/22 1119  BP: (!) 158/72  Pulse: 66  Resp: 14  Temp: (!) 97.2 F (36.2 C)  SpO2: 95%   Filed Weights   09/14/22 1119  Weight: 258 lb  1.6 oz (117.1 kg)    GENERAL: Well-appearing elderly Caucasian male, alert, no distress and comfortable SKIN: skin color, texture, turgor are normal, no rashes or significant lesions EYES: conjunctiva are pink and non-injected, sclera clear LUNGS: clear to auscultation and percussion with normal breathing effort HEART: regular rate & rhythm and no murmurs and no lower extremity edema Musculoskeletal: no cyanosis of digits and no clubbing  PSYCH: alert & oriented x 3, fluent speech NEURO: no focal motor/sensory deficits  LABORATORY DATA:  I have reviewed the data as listed    Latest Ref Rng & Units 09/14/2022   10:45 AM 08/07/2022    7:28 AM 06/15/2022    3:26 PM  CBC  WBC 4.0 - 10.5 K/uL 2.9  2.9  3.3   Hemoglobin 13.0 - 17.0 g/dL 11.5  11.2  10.2   Hematocrit 39.0 - 52.0 % 35.3  36.1  31.1   Platelets 150 - 400 K/uL 114  114  96        Latest Ref Rng & Units 09/14/2022   10:45 AM 06/15/2022    3:26 PM 04/26/2018    9:01 AM  CMP  Glucose 70 - 99 mg/dL 104  104  132   BUN 8 - 23 mg/dL 40  64  30   Creatinine 0.61 - 1.24 mg/dL 1.49  1.76  1.29   Sodium 135 - 145 mmol/L 136  133  142   Potassium 3.5 - 5.1 mmol/L 4.3  4.5  4.3   Chloride 98 - 111 mmol/L 106  107  108   CO2 22 - 32 mmol/L _0 Calcium 8.9 - 10.3 mg/dL 9.7  9.3  9.8   Total Protein 6.5 - 8.1 g/dL 6.7  6.4    Total Bilirubin 0.3 - 1.2 mg/dL 0.8  0.9    Alkaline Phos 38 - 126 U/L 35  22    AST 15 - 41 U/L 33  32    ALT 0 - 44 U/L 15  15      Lab Results   Component Value Date   MPROTEIN Not Observed 06/15/2022   Lab Results  Component Value Date   KPAFRELGTCHN 32.2 (H) 06/15/2022   LAMBDASER 21.0 06/15/2022   KAPLAMBRATIO 1.53 06/15/2022    RADIOGRAPHIC STUDIES: I have personally reviewed the radiological images as listed and agreed with the findings in the report. No results found.  ASSESSMENT & PLAN Joshua [name] 71 y.o. male with medical history significant for pancytopenia who presents for a follow up visit.   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  He does not have any nutritional deficiencies or other clear reasons for his macrocytic anemia and pancytopenia.  Typically this precludes development of morphological abnormalities within the bone marrow.  Given that none of his levels are critically low I would recommend observation at this time.  We can see the patient back in approximately 6 months to reevaluate.  The patient voiced understanding of our findings and was willing and able to proceed with observation at this time.  #Pancytopenia, Suspect Myelodysplasia -- Bone marrow biopsy did not show any clear evidence of abnormality, however no other etiology has been found for his macrocytic anemia and pancytopenia.  This may represent brewing myelodysplasia -- Findings may represent ICUS, consistent with idiopathic cytopenias of undetermined significance. -- Labs today show white blood cell count 2.9, hemoglobin 11.5, MCV 98.3, and platelets of 114 --  Recommend return to clinic in 6 months time to reevaluate.  If counts were to fall recommend earlier follow-up.  No orders of the defined types were placed in this encounter.   All questions were answered. The patient knows to call the clinic with any problems, questions or concerns.  A total of more than 30 minutes were spent on this encounter with face-to-face time and non-face-to-face time, including preparing to see the patient, ordering  tests and/or medications, counseling the patient and coordination of care as outlined above.   Ledell Peoples, MD Department of Hematology/Oncology [place] [facility] Phone: [phone] Pager: [phone] Email: [email or web address]  09/14/2022 5:01 PM

## 2022-09-14 ENCOUNTER — Inpatient Hospital Stay (HOSPITAL_BASED_OUTPATIENT_CLINIC_OR_DEPARTMENT_OTHER): Payer: Medicare HMO | Admitting: Hematology and Oncology

## 2022-09-14 ENCOUNTER — Inpatient Hospital Stay: Payer: Medicare HMO | Attending: Hematology and Oncology

## 2022-09-14 VITALS — BP 158/72 | HR 66 | Temp 97.2°F | Resp 14 | Wt 258.1 lb

## 2022-09-14 DIAGNOSIS — D61818 Other pancytopenia: Secondary | ICD-10-CM

## 2022-09-14 DIAGNOSIS — D696 Thrombocytopenia, unspecified: Secondary | ICD-10-CM | POA: Diagnosis not present

## 2022-09-14 DIAGNOSIS — D539 Nutritional anemia, unspecified: Secondary | ICD-10-CM | POA: Diagnosis not present

## 2022-09-14 LAB — CMP (CANCER CENTER ONLY)
ALT: 15 U/L (ref 0–44)
AST: 33 U/L (ref 15–41)
Albumin: 4.3 g/dL (ref 3.5–5.0)
Alkaline Phosphatase: 35 U/L — ABNORMAL LOW (ref 38–126)
Anion gap: 8 (ref 5–15)
BUN: 40 mg/dL — ABNORMAL HIGH (ref 8–23)
CO2: 22 mmol/L (ref 22–32)
Calcium: 9.7 mg/dL (ref 8.9–10.3)
Chloride: 106 mmol/L (ref 98–111)
Creatinine: 1.49 mg/dL — ABNORMAL HIGH (ref 0.61–1.24)
GFR, Estimated: 49 mL/min — ABNORMAL LOW (ref >60)
Glucose, Bld: 104 mg/dL — ABNORMAL HIGH (ref 70–99)
Potassium: 4.3 mmol/L (ref 3.5–5.1)
Sodium: 136 mmol/L (ref 135–145)
Total Bilirubin: 0.8 mg/dL (ref 0.3–1.2)
Total Protein: 6.7 g/dL (ref 6.5–8.1)

## 2022-09-14 LAB — CBC WITH DIFFERENTIAL (CANCER CENTER ONLY)
Abs Immature Granulocytes: 0.01 10*3/uL (ref 0.00–0.07)
Basophils Absolute: 0 10*3/uL (ref 0.0–0.1)
Basophils Relative: 1 %
Eosinophils Absolute: 0.1 10*3/uL (ref 0.0–0.5)
Eosinophils Relative: 4 %
HCT: 35.3 % — ABNORMAL LOW (ref 39.0–52.0)
Hemoglobin: 11.5 g/dL — ABNORMAL LOW (ref 13.0–17.0)
Immature Granulocytes: 0 %
Lymphocytes Relative: 27 %
Lymphs Abs: 0.8 10*3/uL (ref 0.7–4.0)
MCH: 32 pg (ref 26.0–34.0)
MCHC: 32.6 g/dL (ref 30.0–36.0)
MCV: 98.3 fL (ref 80.0–100.0)
Monocytes Absolute: 0.2 10*3/uL (ref 0.1–1.0)
Monocytes Relative: 8 %
Neutro Abs: 1.7 10*3/uL (ref 1.7–7.7)
Neutrophils Relative %: 60 %
Platelet Count: 114 10*3/uL — ABNORMAL LOW (ref 150–400)
RBC: 3.59 MIL/uL — ABNORMAL LOW (ref 4.22–5.81)
RDW: 15.9 % — ABNORMAL HIGH (ref 11.5–15.5)
WBC Count: 2.9 10*3/uL — ABNORMAL LOW (ref 4.0–10.5)
nRBC: 0 % (ref 0.0–0.2)

## 2022-09-14 LAB — IMMATURE PLATELET FRACTION: Immature Platelet Fraction: 11.7 % — ABNORMAL HIGH (ref 1.2–8.6)

## 2022-09-14 LAB — SAVE SMEAR(SSMR), FOR PROVIDER SLIDE REVIEW

## 2022-09-15 ENCOUNTER — Other Ambulatory Visit: Payer: Self-pay | Admitting: Internal Medicine

## 2022-09-15 DIAGNOSIS — I480 Paroxysmal atrial fibrillation: Secondary | ICD-10-CM

## 2022-09-15 NOTE — Telephone Encounter (Signed)
Prescription refill request for Pradaxa received.  Indication:Afib  Last office visit: 11/11/21 Joshua [name])  Weight: 117.1kg Age: 72 Scr: 1.49 (09/14/22)  CrCl: 73.73m/min  Appropriate dose and refill sent to requested pharmacy.

## 2022-09-23 LAB — COPPER, SERUM: Copper: 95 ug/dL (ref 69–132)

## 2022-11-23 DIAGNOSIS — I10 Essential (primary) hypertension: Secondary | ICD-10-CM | POA: Diagnosis not present

## 2022-11-23 DIAGNOSIS — G4700 Insomnia, unspecified: Secondary | ICD-10-CM | POA: Diagnosis not present

## 2022-11-23 DIAGNOSIS — I2510 Atherosclerotic heart disease of native coronary artery without angina pectoris: Secondary | ICD-10-CM | POA: Diagnosis not present

## 2022-11-23 DIAGNOSIS — I480 Paroxysmal atrial fibrillation: Secondary | ICD-10-CM | POA: Diagnosis not present

## 2022-11-23 DIAGNOSIS — E1121 Type 2 diabetes mellitus with diabetic nephropathy: Secondary | ICD-10-CM | POA: Diagnosis not present

## 2022-11-23 DIAGNOSIS — B354 Tinea corporis: Secondary | ICD-10-CM | POA: Diagnosis not present

## 2022-12-02 DIAGNOSIS — I87332 Chronic venous hypertension (idiopathic) with ulcer and inflammation of left lower extremity: Secondary | ICD-10-CM | POA: Diagnosis not present

## 2022-12-02 DIAGNOSIS — I1310 Hypertensive heart and chronic kidney disease without heart failure, with stage 1 through stage 4 chronic kidney disease, or unspecified chronic kidney disease: Secondary | ICD-10-CM | POA: Diagnosis not present

## 2022-12-02 DIAGNOSIS — I739 Peripheral vascular disease, unspecified: Secondary | ICD-10-CM | POA: Diagnosis not present

## 2022-12-02 DIAGNOSIS — I480 Paroxysmal atrial fibrillation: Secondary | ICD-10-CM | POA: Diagnosis not present

## 2022-12-02 DIAGNOSIS — I87331 Chronic venous hypertension (idiopathic) with ulcer and inflammation of right lower extremity: Secondary | ICD-10-CM | POA: Diagnosis not present

## 2022-12-09 DIAGNOSIS — I480 Paroxysmal atrial fibrillation: Secondary | ICD-10-CM | POA: Diagnosis not present

## 2022-12-09 DIAGNOSIS — I87331 Chronic venous hypertension (idiopathic) with ulcer and inflammation of right lower extremity: Secondary | ICD-10-CM | POA: Diagnosis not present

## 2022-12-09 DIAGNOSIS — I739 Peripheral vascular disease, unspecified: Secondary | ICD-10-CM | POA: Diagnosis not present

## 2022-12-09 DIAGNOSIS — I87332 Chronic venous hypertension (idiopathic) with ulcer and inflammation of left lower extremity: Secondary | ICD-10-CM | POA: Diagnosis not present

## 2022-12-09 DIAGNOSIS — I1310 Hypertensive heart and chronic kidney disease without heart failure, with stage 1 through stage 4 chronic kidney disease, or unspecified chronic kidney disease: Secondary | ICD-10-CM | POA: Diagnosis not present

## 2022-12-16 DIAGNOSIS — D61818 Other pancytopenia: Secondary | ICD-10-CM | POA: Diagnosis not present

## 2022-12-16 DIAGNOSIS — I10 Essential (primary) hypertension: Secondary | ICD-10-CM | POA: Diagnosis not present

## 2023-03-17 ENCOUNTER — Other Ambulatory Visit: Payer: Self-pay | Admitting: Internal Medicine

## 2023-03-17 ENCOUNTER — Other Ambulatory Visit: Payer: Self-pay | Admitting: Hematology and Oncology

## 2023-03-17 DIAGNOSIS — D61818 Other pancytopenia: Secondary | ICD-10-CM

## 2023-03-17 NOTE — Progress Notes (Unsigned)
[facility] [phone]   [phone]  PROGRESS NOTE  Patient Care Team: [name], MD as PCP - General (Internal Medicine) Marinus Maw, MD as PCP - Cardiology (Cardiology) [name], MD (Internal Medicine)  Hematological/Oncological History # Pancytopenia, Suspect Myelodysplasia 05/12/2022: WBC 3.09, Hgb 11.6, MCV 95.5, Plt 103 06/15/2022: establish care with Dr. Leonides Schanz.  No clear etiology based off nutritional panel. 06/17/2022: Abdominal ultrasound performed, no problems with the liver or spleen. 08/07/2022: Bone marrow biopsy performed, showed no clear abnormalities within the bone marrow.   Interval History:  Joshua BLOODSWORTH 72 y.o. male with medical history significant for pancytopenia who presents for a follow up visit. The patient's last visit was on 09/14/2022. In the interim since the last visit he has had no major changes in his health.  On exam today Mr. Mckeone is accompanied by his wife.  He reports he has been well overall.  He notes that he is eating quite well and that he "does not get full".  He notes he is exercising and tends to work in the yard several hours per day.  He enjoys riding lawnmower.  He notes he is drinking plenty of water but it does cause him to have increased urination.  He had no recent changes in his medications but overall he feels like he "feels better".  He notes he had a beer burger yesterday and eats "anything he wants".  He has been eating some occasional barbecue and fast food as well.  He reports his strength and energy levels are improved.  His stools are reportedly more solid after he had a bout where they were running.  He otherwise denies any fevers, chills, sweats, nausea, vomiting or diarrhea.  He is had no recent viral illnesses.  A full 10 point ROS was otherwise negative.   MEDICAL HISTORY:  Past Medical History:  Diagnosis Date   Arthritis    Atrial fibrillation (HCC)    Atrial  flutter (HCC)    Back pain    CAD (coronary artery disease)    stents placed 2009   Complication of anesthesia    woke up during surgery and ablation   DM (diabetes mellitus) (HCC)    type 2   Elevated PSA    Gout    Hearing problem    HTN (hypertension)    Hyperlipidemia    Myocardial infarct, old 2010   Prostatitis    Renal insufficiency     SURGICAL HISTORY: Past Surgical History:  Procedure Laterality Date   CARDIAC CATHETERIZATION  11/07/07   with 2 stents   COLONOSCOPY     Electrophysiologic study and RF catheter ablation   of AV node reentrant tachycardia.     EXTERNAL EAR SURGERY Right    cartilage   LEFT AND RIGHT HEART CATHETERIZATION WITH CORONARY/GRAFT ANGIOGRAM N/A 09/27/2014   Procedure: LEFT AND RIGHT HEART CATHETERIZATION WITH Isabel Caprice;  Surgeon: Lesleigh Noe, MD;  Location: [facility] CATH LAB;  Service: Cardiovascular;  Laterality: N/A;   LUMBAR LAMINECTOMY/DECOMPRESSION MICRODISCECTOMY Left 04/29/2018   Procedure: Left Lumbar Four-Five Laminectomy/Foraminotomy;  Surgeon: Tia Alert, MD;  Location: [facility] OR;  Service: Neurosurgery;  Laterality: Left;  Left Lumbar Four-Five Laminectomy/Foraminotomy   TOOTH EXTRACTION     VASECTOMY      SOCIAL HISTORY: Social History   Socioeconomic History   Marital status: Married    Spouse name: Not on file   Number of children: 2   Years  of education: Not on file   Highest education level: Not on file  Occupational History   Occupation: SERV TECH    Employer: [employer]  Tobacco Use   Smoking status: Never   Smokeless tobacco: Former    Types: Chew  Substance and Sexual Activity   Alcohol use: No   Drug use: No   Sexual activity: Not on file  Other Topics Concern   Not on file  Social History Narrative   Not on file   Social Determinants of Health   Financial Resource Strain: Not on file  Food Insecurity: Not on file  Transportation Needs: Not on file  Physical Activity: Not on file   Stress: Not on file  Social Connections: Not on file  Intimate Partner Violence: Not on file    FAMILY HISTORY: Family History  Problem Relation Age of Onset   Hypertension Mother    Gout Mother    Alzheimer's disease Mother    Heart failure Father    Heart disease Brother    Cancer Brother        type unknown    ALLERGIES:  is allergic to desipramine and niacin.  MEDICATIONS:  Current Outpatient Medications  Medication Sig Dispense Refill   carvedilol (COREG) 12.5 MG tablet TAKE 1 TABLET BY MOUTH TWICE A DAY 180 tablet 0   colchicine 0.6 MG tablet 1 tablet Orally twice daily for 10 days     cyanocobalamin 1000 MCG tablet Take 1,000 mcg by mouth daily.     dabigatran (PRADAXA) 150 MG CAPS capsule TAKE 1 CAPSULE BY MOUTH TWICE A DAY 180 capsule 3   fenofibrate 160 MG tablet Take 160 mg by mouth daily.     ipratropium (ATROVENT) 0.06 % nasal spray take 1-2 sprays into each nostril up to 3 times daily as needed Nasal     lisinopril (ZESTRIL) 40 MG tablet Take 40 mg by mouth daily.     nitroGLYCERIN (NITROSTAT) 0.4 MG SL tablet Place 0.4 mg under the tongue every 5 (five) minutes x 3 doses as needed for chest pain.     pioglitazone (ACTOS) 45 MG tablet Take 45 mg by mouth every evening.      rosuvastatin (CRESTOR) 20 MG tablet Take 20 mg by mouth daily.     No current facility-administered medications for this visit.    REVIEW OF SYSTEMS:   Constitutional: ( - ) fevers, ( - )  chills , ( - ) night sweats Eyes: ( - ) blurriness of vision, ( - ) double vision, ( - ) watery eyes Ears, nose, mouth, throat, and face: ( - ) mucositis, ( - ) sore throat Respiratory: ( - ) cough, ( - ) dyspnea, ( - ) wheezes Cardiovascular: ( - ) palpitation, ( - ) chest discomfort, ( - ) lower extremity swelling Gastrointestinal:  ( - ) nausea, ( - ) heartburn, ( - ) change in bowel habits Skin: ( - ) abnormal skin rashes Lymphatics: ( - ) new lymphadenopathy, ( - ) easy bruising Neurological: ( -  ) numbness, ( - ) tingling, ( - ) new weaknesses Behavioral/Psych: ( - ) mood change, ( - ) new changes  All other systems were reviewed with the patient and are negative.  PHYSICAL EXAMINATION: Vitals:   03/18/23 1110  BP: (!) 166/74  Pulse: 62  Resp: 18  Temp: 98.2 F (36.8 C)  SpO2: 95%    Filed Weights   03/18/23 1110  Weight: 264 lb 4.8  oz (119.9 kg)     GENERAL: Well-appearing elderly Caucasian male, alert, no distress and comfortable SKIN: skin color, texture, turgor are normal, no rashes or significant lesions EYES: conjunctiva are pink and non-injected, sclera clear LUNGS: clear to auscultation and percussion with normal breathing effort HEART: regular rate & rhythm and no murmurs and no lower extremity edema Musculoskeletal: no cyanosis of digits and no clubbing  PSYCH: alert & oriented x 3, fluent speech NEURO: no focal motor/sensory deficits  LABORATORY DATA:  I have reviewed the data as listed    Latest Ref Rng & Units 03/18/2023   10:26 AM 09/14/2022   10:45 AM 08/07/2022    7:28 AM  CBC  WBC 4.0 - 10.5 K/uL 4.3  2.9  2.9   Hemoglobin 13.0 - 17.0 g/dL 16.1  09.6  04.5   Hematocrit 39.0 - 52.0 % 40.5  35.3  36.1   Platelets 150 - 400 K/uL 134  114  114        Latest Ref Rng & Units 03/18/2023   10:26 AM 09/14/2022   10:45 AM 06/15/2022    3:26 PM  CMP  Glucose 70 - 99 mg/dL 409  811  914   BUN 8 - 23 mg/dL 29  40  64   Creatinine 0.61 - 1.24 mg/dL 7.82  9.56  2.13   Sodium 135 - 145 mmol/L 138  136  133   Potassium 3.5 - 5.1 mmol/L 4.3  4.3  4.5   Chloride 98 - 111 mmol/L 108  106  107   CO2 22 - 32 mmol/L 22  22  17    Calcium 8.9 - 10.3 mg/dL 9.2  9.7  9.3   Total Protein 6.5 - 8.1 g/dL 6.3  6.7  6.4   Total Bilirubin 0.3 - 1.2 mg/dL 0.8  0.8  0.9   Alkaline Phos 38 - 126 U/L 40  35  22   AST 15 - 41 U/L 29  33  32   ALT 0 - 44 U/L 12  15  15      Lab Results  Component Value Date   MPROTEIN Not Observed 06/15/2022   Lab Results   Component Value Date   KPAFRELGTCHN 32.2 (H) 06/15/2022   LAMBDASER 21.0 06/15/2022   KAPLAMBRATIO 1.53 06/15/2022    RADIOGRAPHIC STUDIES: I have personally reviewed the radiological images as listed and agreed with the findings in the report. No results found.  ASSESSMENT & PLAN AKHEEM SCHMITZER 72 y.o. male with medical history significant for pancytopenia who presents for a follow up visit.   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  He does not have any nutritional deficiencies or other clear reasons for his macrocytic anemia and pancytopenia.  Typically this precludes development of morphological abnormalities within the bone marrow.  Given that none of his levels are critically low I would recommend observation at this time.  We can see the patient back in approximately 6 months to reevaluate.  The patient voiced understanding of our findings and was willing and able to proceed with observation at this time.  #Pancytopenia, Suspect Myelodysplasia -- Bone marrow biopsy did not show any clear evidence of abnormality, however no other etiology has been found for his macrocytic anemia and pancytopenia.  This may represent brewing myelodysplasia -- Findings may represent ICUS, consistent with idiopathic cytopenias of undetermined significance. -- Labs today show white blood cell count 4.3, Hgb 13.1, MCV 96.5, Plt 134 --  Recommend return to clinic in 12 months time to reevaluate.  If counts were to fall recommend earlier follow-up.  No orders of the defined types were placed in this encounter.   All questions were answered. The patient knows to call the clinic with any problems, questions or concerns.  A total of more than 25 minutes were spent on this encounter with face-to-face time and non-face-to-face time, including preparing to see the patient, ordering tests and/or medications, counseling the patient and coordination of care as outlined above.    Ulysees Barns, MD Department of Hematology/Oncology [facility] Phone: [phone] Pager: [phone] Email: [email or web address]  03/18/2023 4:39 PM

## 2023-03-18 ENCOUNTER — Inpatient Hospital Stay: Payer: Medicare HMO

## 2023-03-18 ENCOUNTER — Other Ambulatory Visit: Payer: Self-pay

## 2023-03-18 ENCOUNTER — Inpatient Hospital Stay: Payer: Medicare HMO | Attending: Hematology and Oncology | Admitting: Hematology and Oncology

## 2023-03-18 VITALS — BP 166/74 | HR 62 | Temp 98.2°F | Resp 18 | Wt 264.3 lb

## 2023-03-18 DIAGNOSIS — D61818 Other pancytopenia: Secondary | ICD-10-CM | POA: Diagnosis not present

## 2023-03-18 DIAGNOSIS — D696 Thrombocytopenia, unspecified: Secondary | ICD-10-CM

## 2023-03-18 LAB — CMP (CANCER CENTER ONLY)
ALT: 12 U/L (ref 0–44)
AST: 29 U/L (ref 15–41)
Albumin: 3.5 g/dL (ref 3.5–5.0)
Alkaline Phosphatase: 40 U/L (ref 38–126)
Anion gap: 8 (ref 5–15)
BUN: 29 mg/dL — ABNORMAL HIGH (ref 8–23)
CO2: 22 mmol/L (ref 22–32)
Calcium: 9.2 mg/dL (ref 8.9–10.3)
Chloride: 108 mmol/L (ref 98–111)
Creatinine: 1.34 mg/dL — ABNORMAL HIGH (ref 0.61–1.24)
GFR, Estimated: 56 mL/min — ABNORMAL LOW (ref >60)
Glucose, Bld: 100 mg/dL — ABNORMAL HIGH (ref 70–99)
Potassium: 4.3 mmol/L (ref 3.5–5.1)
Sodium: 138 mmol/L (ref 135–145)
Total Bilirubin: 0.8 mg/dL (ref 0.3–1.2)
Total Protein: 6.3 g/dL — ABNORMAL LOW (ref 6.5–8.1)

## 2023-03-18 LAB — CBC WITH DIFFERENTIAL (CANCER CENTER ONLY)
Abs Immature Granulocytes: 0.01 10*3/uL (ref 0.00–0.07)
Basophils Absolute: 0 10*3/uL (ref 0.0–0.1)
Basophils Relative: 1 %
Eosinophils Absolute: 0.3 10*3/uL (ref 0.0–0.5)
Eosinophils Relative: 6 %
HCT: 40.5 % (ref 39.0–52.0)
Hemoglobin: 13.1 g/dL (ref 13.0–17.0)
Immature Granulocytes: 0 %
Lymphocytes Relative: 19 %
Lymphs Abs: 0.8 10*3/uL (ref 0.7–4.0)
MCH: 31.2 pg (ref 26.0–34.0)
MCHC: 32.3 g/dL (ref 30.0–36.0)
MCV: 96.4 fL (ref 80.0–100.0)
Monocytes Absolute: 0.4 10*3/uL (ref 0.1–1.0)
Monocytes Relative: 9 %
Neutro Abs: 2.8 10*3/uL (ref 1.7–7.7)
Neutrophils Relative %: 65 %
Platelet Count: 134 10*3/uL — ABNORMAL LOW (ref 150–400)
RBC: 4.2 MIL/uL — ABNORMAL LOW (ref 4.22–5.81)
RDW: 15.8 % — ABNORMAL HIGH (ref 11.5–15.5)
WBC Count: 4.3 10*3/uL (ref 4.0–10.5)
nRBC: 0 % (ref 0.0–0.2)

## 2023-03-19 NOTE — Addendum Note (Signed)
Addended by: Mertha Finders A on: 03/19/2023 01:03 PM   Modules accepted: Orders

## 2023-03-22 DIAGNOSIS — H25013 Cortical age-related cataract, bilateral: Secondary | ICD-10-CM | POA: Diagnosis not present

## 2023-03-22 DIAGNOSIS — H2513 Age-related nuclear cataract, bilateral: Secondary | ICD-10-CM | POA: Diagnosis not present

## 2023-03-22 DIAGNOSIS — Z0100 Encounter for examination of eyes and vision without abnormal findings: Secondary | ICD-10-CM | POA: Diagnosis not present

## 2023-03-22 DIAGNOSIS — E119 Type 2 diabetes mellitus without complications: Secondary | ICD-10-CM | POA: Diagnosis not present

## 2023-04-06 DIAGNOSIS — H903 Sensorineural hearing loss, bilateral: Secondary | ICD-10-CM | POA: Diagnosis not present

## 2023-04-16 DIAGNOSIS — H903 Sensorineural hearing loss, bilateral: Secondary | ICD-10-CM | POA: Diagnosis not present

## 2023-04-27 ENCOUNTER — Ambulatory Visit: Payer: Medicare HMO | Attending: Internal Medicine | Admitting: Internal Medicine

## 2023-04-27 ENCOUNTER — Encounter: Payer: Self-pay | Admitting: Internal Medicine

## 2023-04-27 VITALS — BP 180/72 | HR 49 | Ht 68.0 in | Wt 254.0 lb

## 2023-04-27 DIAGNOSIS — I480 Paroxysmal atrial fibrillation: Secondary | ICD-10-CM

## 2023-04-27 MED ORDER — OLMESARTAN MEDOXOMIL 40 MG PO TABS: 40.0000 mg | ORAL_TABLET | Freq: Every day | ORAL | 3 refills | Status: DC

## 2023-04-27 NOTE — Progress Notes (Signed)
HPI [name] returns today for followup of atrial fib. He is well known to me from remote SVT ablation. He has PAF. He denies chest pain or sob. He does not have angina. He has a remote stent.  His blood pressure has been a bit elevated and his HR is a little slow. He denies medical non-compliance. He had developed some peripheral edema on amlodipine and this was stopped and his edema improved. He denies palpitations. He has lost over 20 lbs since his last visit. He has been diagnosed with MDS. Allergies  Allergen Reactions   Desipramine Hives and Itching   Niacin Itching     Current Outpatient Medications  Medication Sig Dispense Refill   carvedilol (COREG) 12.5 MG tablet TAKE 1 TABLET BY MOUTH TWICE A DAY 180 tablet 0   colchicine 0.6 MG tablet 1 tablet Orally twice daily for 10 days     dabigatran (PRADAXA) 150 MG CAPS capsule TAKE 1 CAPSULE BY MOUTH TWICE A DAY 180 capsule 3   fenofibrate 160 MG tablet Take 160 mg by mouth daily.     ipratropium (ATROVENT) 0.06 % nasal spray take 1-2 sprays into each nostril up to 3 times daily as needed Nasal     isosorbide mononitrate (IMDUR) 30 MG 24 hr tablet Take 30 mg by mouth daily.     nitroGLYCERIN (NITROSTAT) 0.4 MG SL tablet Place 0.4 mg under the tongue every 5 (five) minutes x 3 doses as needed for chest pain.     olmesartan (BENICAR) 40 MG tablet Take 1 tablet (40 mg total) by mouth daily. 90 tablet 3   pioglitazone (ACTOS) 45 MG tablet Take 45 mg by mouth every evening.      rosuvastatin (CRESTOR) 20 MG tablet Take 20 mg by mouth daily.     vitamin B-12 (CYANOCOBALAMIN) 500 MCG tablet Take 500 mcg by mouth daily.     No current facility-administered medications for this visit.     Past Medical History:  Diagnosis Date   Arthritis    Atrial fibrillation (HCC)    Atrial flutter (HCC)    Back pain    CAD (coronary artery disease)    stents placed 2009   Complication of anesthesia    woke up during surgery and ablation    DM (diabetes mellitus) (HCC)    type 2   Elevated PSA    Gout    Hearing problem    HTN (hypertension)    Hyperlipidemia    Myocardial infarct, old 2010   Prostatitis    Renal insufficiency     ROS:   All systems reviewed and negative except as noted in the HPI.   Past Surgical History:  Procedure Laterality Date   CARDIAC CATHETERIZATION  11/07/07   with 2 stents   COLONOSCOPY     Electrophysiologic study and RF catheter ablation   of AV node reentrant tachycardia.     EXTERNAL EAR SURGERY Right    cartilage   LEFT AND RIGHT HEART CATHETERIZATION WITH CORONARY/GRAFT ANGIOGRAM N/A 09/27/2014   Procedure: LEFT AND RIGHT HEART CATHETERIZATION WITH Isabel Caprice;  Surgeon: Lesleigh Noe, MD;  Location: [facility] CATH LAB;  Service: Cardiovascular;  Laterality: N/A;   LUMBAR LAMINECTOMY/DECOMPRESSION MICRODISCECTOMY Left 04/29/2018   Procedure: Left Lumbar Four-Five Laminectomy/Foraminotomy;  Surgeon: Tia Alert, MD;  Location: [facility] OR;  Service: Neurosurgery;  Laterality: Left;  Left Lumbar Four-Five Laminectomy/Foraminotomy   TOOTH EXTRACTION     VASECTOMY  Family History  Problem Relation Age of Onset   Hypertension Mother    Gout Mother    Alzheimer's disease Mother    Heart failure Father    Heart disease Brother    Cancer Brother        type unknown     Social History   Socioeconomic History   Marital status: Married    Spouse name: Not on file   Number of children: 2   Years of education: Not on file   Highest education level: Not on file  Occupational History   Occupation: SERV TECH    Employer: [employer]  Tobacco Use   Smoking status: Never   Smokeless tobacco: Former    Types: Chew  Substance and Sexual Activity   Alcohol use: No   Drug use: No   Sexual activity: Not on file  Other Topics Concern   Not on file  Social History Narrative   Not on file   Social Determinants of Health   Financial Resource Strain: Not on file   Food Insecurity: Not on file  Transportation Needs: Not on file  Physical Activity: Not on file  Stress: Not on file  Social Connections: Not on file  Intimate Partner Violence: Not on file     BP (!) 180/72   Pulse (!) 49   Ht 5\' 8"  (1.727 m)   Wt 254 lb (115.2 kg)   SpO2 96%   BMI 38.62 kg/m   Physical Exam:  Well appearing NAD HEENT: Unremarkable Neck:  No JVD, no thyromegally Lymphatics:  No adenopathy Back:  No CVA tenderness Lungs:  Clear with no wheezes HEART:  Regular rate rhythm, no murmurs, no rubs, no clicks Abd:  soft, positive bowel sounds, no organomegally, no rebound, no guarding Ext:  2 plus pulses, no edema, no cyanosis, no clubbing Skin:  No rashes no nodules Neuro:  CN II through XII intact, motor grossly intact  EKG - nsr  Assess/Plan:  Atrial fib - his symptoms are fairly well controlled. No change in his meds for now. He is maintaining NSR HTN - he notes that SBP is much better at home in the 130-140 range. Continue coreg.  Edema - this is much improved with stopping the amlodipine. CAD - he denies anginal symptoms.  Obesity - I encouraged him to lose weight. He is down 20 lbs.    Sharlot Gowda Oceane Fosse,MD

## 2023-04-27 NOTE — Patient Instructions (Signed)
Medication Instructions:  Your physician has recommended you make the following change in your medication:  1) INCREASE olmesartan to 40 mg daily *If you need a refill on your cardiac medications before your next appointment, please call your pharmacy*  Lab Work: IN TWO WEEKS: BMET  Follow-Up: At [facility], you and your health needs are our priority.  As part of our continuing mission to provide you with exceptional heart care, we have created designated Provider Care Teams.  These Care Teams include your primary Cardiologist (physician) and Advanced Practice Providers (APPs -  Physician Assistants and Nurse Practitioners) who all work together to provide you with the care you need, when you need it.  Your next appointment:   1 year(s)  Provider:   You may see Dr. Lewayne Bunting or one of the following Advanced Practice Providers on your designated Care Team:   Francis Dowse, PA-C Casimiro Needle [address]" [place], [place] Sherie Don, NP Canary Brim, NP

## 2023-05-11 ENCOUNTER — Ambulatory Visit: Payer: Medicare HMO | Attending: Internal Medicine

## 2023-05-11 DIAGNOSIS — I480 Paroxysmal atrial fibrillation: Secondary | ICD-10-CM

## 2023-05-11 LAB — BASIC METABOLIC PANEL
BUN/Creatinine Ratio: 17 (ref 10–24)
BUN: 24 mg/dL (ref 8–27)
CO2: 22 mmol/L (ref 20–29)
Calcium: 9.1 mg/dL (ref 8.6–10.2)
Chloride: 106 mmol/L (ref 96–106)
Creatinine, Ser: 1.41 mg/dL — ABNORMAL HIGH (ref 0.76–1.27)
Glucose: 105 mg/dL — ABNORMAL HIGH (ref 70–99)
Potassium: 4.7 mmol/L (ref 3.5–5.2)
Sodium: 139 mmol/L (ref 134–144)
eGFR: 52 mL/min/{1.73_m2} — ABNORMAL LOW (ref >59)

## 2023-06-13 ENCOUNTER — Other Ambulatory Visit: Payer: Self-pay | Admitting: Internal Medicine

## 2023-07-20 DIAGNOSIS — I10 Essential (primary) hypertension: Secondary | ICD-10-CM | POA: Diagnosis not present

## 2023-07-20 DIAGNOSIS — E039 Hypothyroidism, unspecified: Secondary | ICD-10-CM | POA: Diagnosis not present

## 2023-07-20 DIAGNOSIS — I739 Peripheral vascular disease, unspecified: Secondary | ICD-10-CM | POA: Diagnosis not present

## 2023-07-20 DIAGNOSIS — Z1339 Encounter for screening examination for other mental health and behavioral disorders: Secondary | ICD-10-CM | POA: Diagnosis not present

## 2023-07-20 DIAGNOSIS — I480 Paroxysmal atrial fibrillation: Secondary | ICD-10-CM | POA: Diagnosis not present

## 2023-07-20 DIAGNOSIS — D61818 Other pancytopenia: Secondary | ICD-10-CM | POA: Diagnosis not present

## 2023-07-20 DIAGNOSIS — M109 Gout, unspecified: Secondary | ICD-10-CM | POA: Diagnosis not present

## 2023-07-20 DIAGNOSIS — M961 Postlaminectomy syndrome, not elsewhere classified: Secondary | ICD-10-CM | POA: Diagnosis not present

## 2023-07-20 DIAGNOSIS — E1121 Type 2 diabetes mellitus with diabetic nephropathy: Secondary | ICD-10-CM | POA: Diagnosis not present

## 2023-07-20 DIAGNOSIS — E1151 Type 2 diabetes mellitus with diabetic peripheral angiopathy without gangrene: Secondary | ICD-10-CM | POA: Diagnosis not present

## 2023-07-20 DIAGNOSIS — R82998 Other abnormal findings in urine: Secondary | ICD-10-CM | POA: Diagnosis not present

## 2023-07-20 DIAGNOSIS — L989 Disorder of the skin and subcutaneous tissue, unspecified: Secondary | ICD-10-CM | POA: Diagnosis not present

## 2023-07-20 DIAGNOSIS — I2510 Atherosclerotic heart disease of native coronary artery without angina pectoris: Secondary | ICD-10-CM | POA: Diagnosis not present

## 2023-07-20 DIAGNOSIS — Z0000 Encounter for general adult medical examination without abnormal findings: Secondary | ICD-10-CM | POA: Diagnosis not present

## 2023-07-20 DIAGNOSIS — Z1331 Encounter for screening for depression: Secondary | ICD-10-CM | POA: Diagnosis not present

## 2023-08-23 DIAGNOSIS — N1831 Chronic kidney disease, stage 3a: Secondary | ICD-10-CM | POA: Diagnosis not present

## 2023-08-23 DIAGNOSIS — I129 Hypertensive chronic kidney disease with stage 1 through stage 4 chronic kidney disease, or unspecified chronic kidney disease: Secondary | ICD-10-CM | POA: Diagnosis not present

## 2023-08-23 DIAGNOSIS — R809 Proteinuria, unspecified: Secondary | ICD-10-CM | POA: Diagnosis not present

## 2023-08-23 DIAGNOSIS — N2581 Secondary hyperparathyroidism of renal origin: Secondary | ICD-10-CM | POA: Diagnosis not present

## 2023-08-25 ENCOUNTER — Other Ambulatory Visit: Payer: Self-pay | Admitting: Internal Medicine

## 2023-08-25 DIAGNOSIS — I480 Paroxysmal atrial fibrillation: Secondary | ICD-10-CM

## 2023-08-25 NOTE — Telephone Encounter (Signed)
Pradaxa 150mg  refill request received. Pt is 72 years old, weight-115.2kg, Crea-1.41 on 05/11/23, last seen by Dr. Ladona Ridgel on 04/27/23, Diagnosis-Afib, CrCl-74.89 mL/min; Dose is appropriate based on dosing criteria. Will send in refill to requested pharmacy.

## 2023-09-08 ENCOUNTER — Other Ambulatory Visit (HOSPITAL_COMMUNITY): Payer: Self-pay | Admitting: Nephrology

## 2023-09-08 DIAGNOSIS — R809 Proteinuria, unspecified: Secondary | ICD-10-CM

## 2023-09-10 ENCOUNTER — Telehealth: Payer: Self-pay

## 2023-09-10 NOTE — Telephone Encounter (Signed)
Patient with diagnosis of afib on dabigatran for anticoagulation.    Procedure: biopsy of kidney Date of procedure: 09/30/23   CHA2DS2-VASc Score = 4   This indicates a 4.8% annual risk of stroke. The patient's score is based upon: CHF History: 0 HTN History: 1 Diabetes History: 1 Stroke History: 0 Vascular Disease History: 1 Age Score: 1 Gender Score: 0      CrCl 56.59 ml/min Platelet count 134  Per office protocol, patient can hold Pradaxa for 2-3 days prior to procedure.    **This guidance is not considered finalized until pre-operative APP has relayed final recommendations.**

## 2023-09-10 NOTE — Telephone Encounter (Signed)
   Pre-operative Risk Assessment    Patient Name: Joshua [name]  DOB: 11-30-1950 MRN: [id number]     Request for Surgical Clearance    Procedure:  Biopsy of kidney   Date of Surgery:  Clearance 09/30/2023                                 Surgeon:  Dr. Marliss Coots  Surgeon's Group or Practice Name:  [facility] health radiology department  Phone number:  [phone] Fax number:  [phone] attn: Efraim Kaufmann    Type of Clearance Requested:   - Pharmacy:  Hold Dabigatran (Pradaxa)     Type of Anesthesia:  Moderate sedation    Additional requests/questions:    Scarlette Shorts   09/10/2023, 2:58 PM

## 2023-09-10 NOTE — Telephone Encounter (Signed)
Pharmacy please advise on holding Dabigatran  prior to kidney biopsy scheduled for 09/30/2023. Thank you.

## 2023-09-13 NOTE — Telephone Encounter (Signed)
   Patient Name: Joshua [name]  DOB: 22-Mar-1951 MRN: [id number]  Primary Cardiologist: Lewayne Bunting, MD  Clinical pharmacists have reviewed the patient's past medical history, labs, and current medications as part of preoperative protocol coverage. The following recommendations have been made:   Patient with diagnosis of afib on dabigatran for anticoagulation.     Procedure: biopsy of kidney Date of procedure: 09/30/23     CHA2DS2-VASc Score = 4   This indicates a 4.8% annual risk of stroke. The patient's score is based upon: CHF History: 0 HTN History: 1 Diabetes History: 1 Stroke History: 0 Vascular Disease History: 1 Age Score: 1 Gender Score: 0       CrCl 56.59 ml/min Platelet count 134   Per office protocol, patient can hold Pradaxa for 2-3 days prior to procedure.  Please resume Pradaxa as soon as possible postprocedure, at the discretion of the surgeon.    I will route this recommendation to the requesting party via Epic fax function and remove from pre-op pool.  Please call with questions.  Joylene Grapes, NP 09/13/2023, 11:13 AM

## 2023-09-17 NOTE — Progress Notes (Signed)
Joshua Maw, MD  [name] Ok to hold pradaxa for 48-72 hours prior to procedure. Restart after when the bleeding risk is acceptable. GT       Previous Messages    ----- Message ----- From: [name] Sent: 09/08/2023  10:55 AM EST To: Joshua Maw, MD; [name] Subject: Hold request for blood thinner                Hi Dr. Ladona Ridgel ,  Patient has a renal biopsy scheduled for 09/30/23 - is Pradaxa ok to hold 48 hrs prior to procedure ? Or longer hold time ? Pt says he usually hold for 3 days prior to procedures like this .   Thank you ,  [facility]  Centralized Scheduling for Imaging Services System-Wide Scheduler II Direct Dial: [phone]  Fax: [phone]

## 2023-09-24 ENCOUNTER — Encounter (HOSPITAL_COMMUNITY): Payer: Self-pay

## 2023-09-24 ENCOUNTER — Inpatient Hospital Stay (HOSPITAL_COMMUNITY)
Admission: EM | Admit: 2023-09-24 | Discharge: 2023-10-24 | DRG: 377 | Disposition: A | Discharge status: Rehabilitation Facility | Payer: Medicare HMO | Source: Ambulatory Visit | Attending: Family Medicine | Admitting: Family Medicine

## 2023-09-24 ENCOUNTER — Other Ambulatory Visit: Payer: Self-pay

## 2023-09-24 DIAGNOSIS — E871 Hypo-osmolality and hyponatremia: Secondary | ICD-10-CM | POA: Diagnosis not present

## 2023-09-24 DIAGNOSIS — R918 Other nonspecific abnormal finding of lung field: Secondary | ICD-10-CM | POA: Diagnosis not present

## 2023-09-24 DIAGNOSIS — R9389 Abnormal findings on diagnostic imaging of other specified body structures: Secondary | ICD-10-CM | POA: Diagnosis not present

## 2023-09-24 DIAGNOSIS — R627 Adult failure to thrive: Secondary | ICD-10-CM | POA: Diagnosis present

## 2023-09-24 DIAGNOSIS — Y846 Urinary catheterization as the cause of abnormal reaction of the patient, or of later complication, without mention of misadventure at the time of the procedure: Secondary | ICD-10-CM | POA: Diagnosis not present

## 2023-09-24 DIAGNOSIS — K760 Fatty (change of) liver, not elsewhere classified: Secondary | ICD-10-CM | POA: Diagnosis not present

## 2023-09-24 DIAGNOSIS — R4589 Other symptoms and signs involving emotional state: Secondary | ICD-10-CM | POA: Diagnosis not present

## 2023-09-24 DIAGNOSIS — I25119 Atherosclerotic heart disease of native coronary artery with unspecified angina pectoris: Secondary | ICD-10-CM | POA: Diagnosis present

## 2023-09-24 DIAGNOSIS — R4182 Altered mental status, unspecified: Secondary | ICD-10-CM | POA: Diagnosis not present

## 2023-09-24 DIAGNOSIS — Z6838 Body mass index (BMI) 38.0-38.9, adult: Secondary | ICD-10-CM

## 2023-09-24 DIAGNOSIS — J811 Chronic pulmonary edema: Secondary | ICD-10-CM | POA: Diagnosis not present

## 2023-09-24 DIAGNOSIS — I129 Hypertensive chronic kidney disease with stage 1 through stage 4 chronic kidney disease, or unspecified chronic kidney disease: Secondary | ICD-10-CM | POA: Diagnosis present

## 2023-09-24 DIAGNOSIS — K8020 Calculus of gallbladder without cholecystitis without obstruction: Secondary | ICD-10-CM | POA: Diagnosis present

## 2023-09-24 DIAGNOSIS — I2583 Coronary atherosclerosis due to lipid rich plaque: Secondary | ICD-10-CM | POA: Diagnosis present

## 2023-09-24 DIAGNOSIS — J90 Pleural effusion, not elsewhere classified: Secondary | ICD-10-CM | POA: Diagnosis not present

## 2023-09-24 DIAGNOSIS — Z4682 Encounter for fitting and adjustment of non-vascular catheter: Secondary | ICD-10-CM | POA: Diagnosis not present

## 2023-09-24 DIAGNOSIS — J449 Chronic obstructive pulmonary disease, unspecified: Secondary | ICD-10-CM | POA: Diagnosis not present

## 2023-09-24 DIAGNOSIS — R739 Hyperglycemia, unspecified: Secondary | ICD-10-CM | POA: Insufficient documentation

## 2023-09-24 DIAGNOSIS — I4892 Unspecified atrial flutter: Secondary | ICD-10-CM | POA: Diagnosis not present

## 2023-09-24 DIAGNOSIS — I4891 Unspecified atrial fibrillation: Secondary | ICD-10-CM | POA: Diagnosis not present

## 2023-09-24 DIAGNOSIS — I872 Venous insufficiency (chronic) (peripheral): Secondary | ICD-10-CM | POA: Diagnosis present

## 2023-09-24 DIAGNOSIS — D62 Acute posthemorrhagic anemia: Secondary | ICD-10-CM | POA: Diagnosis present

## 2023-09-24 DIAGNOSIS — D649 Anemia, unspecified: Secondary | ICD-10-CM | POA: Diagnosis present

## 2023-09-24 DIAGNOSIS — G4733 Obstructive sleep apnea (adult) (pediatric): Secondary | ICD-10-CM | POA: Diagnosis present

## 2023-09-24 DIAGNOSIS — D75839 Thrombocytosis, unspecified: Secondary | ICD-10-CM | POA: Diagnosis present

## 2023-09-24 DIAGNOSIS — D68318 Other hemorrhagic disorder due to intrinsic circulating anticoagulants, antibodies, or inhibitors: Secondary | ICD-10-CM | POA: Diagnosis not present

## 2023-09-24 DIAGNOSIS — D61818 Other pancytopenia: Secondary | ICD-10-CM | POA: Diagnosis not present

## 2023-09-24 DIAGNOSIS — D469 Myelodysplastic syndrome, unspecified: Secondary | ICD-10-CM | POA: Diagnosis present

## 2023-09-24 DIAGNOSIS — K5900 Constipation, unspecified: Secondary | ICD-10-CM | POA: Diagnosis not present

## 2023-09-24 DIAGNOSIS — N170 Acute kidney failure with tubular necrosis: Secondary | ICD-10-CM | POA: Diagnosis not present

## 2023-09-24 DIAGNOSIS — Z79899 Other long term (current) drug therapy: Secondary | ICD-10-CM | POA: Diagnosis not present

## 2023-09-24 DIAGNOSIS — R310 Gross hematuria: Secondary | ICD-10-CM | POA: Diagnosis not present

## 2023-09-24 DIAGNOSIS — Z6837 Body mass index (BMI) 37.0-37.9, adult: Secondary | ICD-10-CM

## 2023-09-24 DIAGNOSIS — R300 Dysuria: Secondary | ICD-10-CM | POA: Diagnosis not present

## 2023-09-24 DIAGNOSIS — Z781 Physical restraint status: Secondary | ICD-10-CM

## 2023-09-24 DIAGNOSIS — R1310 Dysphagia, unspecified: Secondary | ICD-10-CM | POA: Diagnosis not present

## 2023-09-24 DIAGNOSIS — I2510 Atherosclerotic heart disease of native coronary artery without angina pectoris: Secondary | ICD-10-CM | POA: Diagnosis present

## 2023-09-24 DIAGNOSIS — E785 Hyperlipidemia, unspecified: Secondary | ICD-10-CM | POA: Diagnosis present

## 2023-09-24 DIAGNOSIS — Z888 Allergy status to other drugs, medicaments and biological substances status: Secondary | ICD-10-CM

## 2023-09-24 DIAGNOSIS — Z820 Family history of epilepsy and other diseases of the nervous system: Secondary | ICD-10-CM

## 2023-09-24 DIAGNOSIS — I272 Pulmonary hypertension, unspecified: Secondary | ICD-10-CM | POA: Diagnosis present

## 2023-09-24 DIAGNOSIS — Z7984 Long term (current) use of oral hypoglycemic drugs: Secondary | ICD-10-CM

## 2023-09-24 DIAGNOSIS — E1122 Type 2 diabetes mellitus with diabetic chronic kidney disease: Secondary | ICD-10-CM | POA: Diagnosis present

## 2023-09-24 DIAGNOSIS — R634 Abnormal weight loss: Secondary | ICD-10-CM | POA: Diagnosis not present

## 2023-09-24 DIAGNOSIS — N281 Cyst of kidney, acquired: Secondary | ICD-10-CM | POA: Diagnosis not present

## 2023-09-24 DIAGNOSIS — N1831 Chronic kidney disease, stage 3a: Secondary | ICD-10-CM | POA: Diagnosis present

## 2023-09-24 DIAGNOSIS — I959 Hypotension, unspecified: Secondary | ICD-10-CM | POA: Diagnosis not present

## 2023-09-24 DIAGNOSIS — D75838 Other thrombocytosis: Secondary | ICD-10-CM | POA: Diagnosis present

## 2023-09-24 DIAGNOSIS — E43 Unspecified severe protein-calorie malnutrition: Secondary | ICD-10-CM | POA: Diagnosis not present

## 2023-09-24 DIAGNOSIS — H5711 Ocular pain, right eye: Secondary | ICD-10-CM | POA: Diagnosis not present

## 2023-09-24 DIAGNOSIS — T45525A Adverse effect of antithrombotic drugs, initial encounter: Secondary | ICD-10-CM | POA: Diagnosis present

## 2023-09-24 DIAGNOSIS — J690 Pneumonitis due to inhalation of food and vomit: Secondary | ICD-10-CM | POA: Diagnosis not present

## 2023-09-24 DIAGNOSIS — Z860100 Personal history of colon polyps, unspecified: Secondary | ICD-10-CM

## 2023-09-24 DIAGNOSIS — R1030 Lower abdominal pain, unspecified: Secondary | ICD-10-CM | POA: Diagnosis not present

## 2023-09-24 DIAGNOSIS — N179 Acute kidney failure, unspecified: Secondary | ICD-10-CM | POA: Diagnosis present

## 2023-09-24 DIAGNOSIS — I10 Essential (primary) hypertension: Secondary | ICD-10-CM | POA: Diagnosis present

## 2023-09-24 DIAGNOSIS — D689 Coagulation defect, unspecified: Secondary | ICD-10-CM | POA: Insufficient documentation

## 2023-09-24 DIAGNOSIS — R001 Bradycardia, unspecified: Secondary | ICD-10-CM | POA: Diagnosis not present

## 2023-09-24 DIAGNOSIS — Z66 Do not resuscitate: Secondary | ICD-10-CM | POA: Diagnosis present

## 2023-09-24 DIAGNOSIS — E119 Type 2 diabetes mellitus without complications: Secondary | ICD-10-CM

## 2023-09-24 DIAGNOSIS — R0989 Other specified symptoms and signs involving the circulatory and respiratory systems: Secondary | ICD-10-CM | POA: Diagnosis not present

## 2023-09-24 DIAGNOSIS — Z7901 Long term (current) use of anticoagulants: Secondary | ICD-10-CM

## 2023-09-24 DIAGNOSIS — R531 Weakness: Secondary | ICD-10-CM | POA: Diagnosis not present

## 2023-09-24 DIAGNOSIS — G319 Degenerative disease of nervous system, unspecified: Secondary | ICD-10-CM | POA: Diagnosis not present

## 2023-09-24 DIAGNOSIS — E1151 Type 2 diabetes mellitus with diabetic peripheral angiopathy without gangrene: Secondary | ICD-10-CM | POA: Diagnosis not present

## 2023-09-24 DIAGNOSIS — R0602 Shortness of breath: Secondary | ICD-10-CM | POA: Diagnosis not present

## 2023-09-24 DIAGNOSIS — Z955 Presence of coronary angioplasty implant and graft: Secondary | ICD-10-CM

## 2023-09-24 DIAGNOSIS — E872 Acidosis, unspecified: Secondary | ICD-10-CM | POA: Diagnosis not present

## 2023-09-24 DIAGNOSIS — I471 Supraventricular tachycardia, unspecified: Secondary | ICD-10-CM | POA: Diagnosis not present

## 2023-09-24 DIAGNOSIS — K921 Melena: Secondary | ICD-10-CM | POA: Diagnosis not present

## 2023-09-24 DIAGNOSIS — R5381 Other malaise: Secondary | ICD-10-CM | POA: Diagnosis not present

## 2023-09-24 DIAGNOSIS — I517 Cardiomegaly: Secondary | ICD-10-CM | POA: Diagnosis not present

## 2023-09-24 DIAGNOSIS — J942 Hemothorax: Secondary | ICD-10-CM

## 2023-09-24 DIAGNOSIS — K922 Gastrointestinal hemorrhage, unspecified: Principal | ICD-10-CM

## 2023-09-24 DIAGNOSIS — I4719 Other supraventricular tachycardia: Secondary | ICD-10-CM | POA: Diagnosis not present

## 2023-09-24 DIAGNOSIS — F05 Delirium due to known physiological condition: Secondary | ICD-10-CM | POA: Diagnosis not present

## 2023-09-24 DIAGNOSIS — E876 Hypokalemia: Secondary | ICD-10-CM | POA: Diagnosis not present

## 2023-09-24 DIAGNOSIS — N1330 Unspecified hydronephrosis: Secondary | ICD-10-CM | POA: Diagnosis not present

## 2023-09-24 DIAGNOSIS — Z87891 Personal history of nicotine dependence: Secondary | ICD-10-CM

## 2023-09-24 DIAGNOSIS — J869 Pyothorax without fistula: Secondary | ICD-10-CM | POA: Diagnosis not present

## 2023-09-24 DIAGNOSIS — J939 Pneumothorax, unspecified: Secondary | ICD-10-CM | POA: Diagnosis not present

## 2023-09-24 DIAGNOSIS — K828 Other specified diseases of gallbladder: Secondary | ICD-10-CM | POA: Diagnosis not present

## 2023-09-24 DIAGNOSIS — Z515 Encounter for palliative care: Secondary | ICD-10-CM | POA: Diagnosis not present

## 2023-09-24 DIAGNOSIS — J9601 Acute respiratory failure with hypoxia: Secondary | ICD-10-CM | POA: Diagnosis not present

## 2023-09-24 DIAGNOSIS — F419 Anxiety disorder, unspecified: Secondary | ICD-10-CM | POA: Diagnosis present

## 2023-09-24 DIAGNOSIS — M1990 Unspecified osteoarthritis, unspecified site: Secondary | ICD-10-CM | POA: Diagnosis present

## 2023-09-24 DIAGNOSIS — D696 Thrombocytopenia, unspecified: Secondary | ICD-10-CM | POA: Diagnosis present

## 2023-09-24 DIAGNOSIS — N189 Chronic kidney disease, unspecified: Secondary | ICD-10-CM | POA: Diagnosis present

## 2023-09-24 DIAGNOSIS — I447 Left bundle-branch block, unspecified: Secondary | ICD-10-CM | POA: Diagnosis present

## 2023-09-24 DIAGNOSIS — I700 Atherosclerosis of aorta: Secondary | ICD-10-CM | POA: Diagnosis not present

## 2023-09-24 DIAGNOSIS — I480 Paroxysmal atrial fibrillation: Secondary | ICD-10-CM | POA: Diagnosis not present

## 2023-09-24 DIAGNOSIS — K31811 Angiodysplasia of stomach and duodenum with bleeding: Principal | ICD-10-CM | POA: Diagnosis present

## 2023-09-24 DIAGNOSIS — R040 Epistaxis: Secondary | ICD-10-CM | POA: Diagnosis not present

## 2023-09-24 DIAGNOSIS — I472 Ventricular tachycardia, unspecified: Secondary | ICD-10-CM | POA: Diagnosis not present

## 2023-09-24 DIAGNOSIS — Z7189 Other specified counseling: Secondary | ICD-10-CM | POA: Diagnosis not present

## 2023-09-24 DIAGNOSIS — K5730 Diverticulosis of large intestine without perforation or abscess without bleeding: Secondary | ICD-10-CM | POA: Diagnosis present

## 2023-09-24 DIAGNOSIS — F09 Unspecified mental disorder due to known physiological condition: Secondary | ICD-10-CM | POA: Diagnosis not present

## 2023-09-24 DIAGNOSIS — Z7902 Long term (current) use of antithrombotics/antiplatelets: Secondary | ICD-10-CM

## 2023-09-24 DIAGNOSIS — Z8249 Family history of ischemic heart disease and other diseases of the circulatory system: Secondary | ICD-10-CM

## 2023-09-24 DIAGNOSIS — G4700 Insomnia, unspecified: Secondary | ICD-10-CM | POA: Diagnosis present

## 2023-09-24 DIAGNOSIS — J9811 Atelectasis: Secondary | ICD-10-CM | POA: Diagnosis not present

## 2023-09-24 DIAGNOSIS — R350 Frequency of micturition: Secondary | ICD-10-CM | POA: Diagnosis not present

## 2023-09-24 DIAGNOSIS — R590 Localized enlarged lymph nodes: Secondary | ICD-10-CM | POA: Diagnosis not present

## 2023-09-24 DIAGNOSIS — I6782 Cerebral ischemia: Secondary | ICD-10-CM | POA: Diagnosis not present

## 2023-09-24 DIAGNOSIS — T797XXA Traumatic subcutaneous emphysema, initial encounter: Secondary | ICD-10-CM | POA: Diagnosis not present

## 2023-09-24 DIAGNOSIS — R195 Other fecal abnormalities: Secondary | ICD-10-CM | POA: Diagnosis not present

## 2023-09-24 DIAGNOSIS — Z48813 Encounter for surgical aftercare following surgery on the respiratory system: Secondary | ICD-10-CM | POA: Diagnosis not present

## 2023-09-24 DIAGNOSIS — I252 Old myocardial infarction: Secondary | ICD-10-CM

## 2023-09-24 DIAGNOSIS — R54 Age-related physical debility: Secondary | ICD-10-CM | POA: Diagnosis present

## 2023-09-24 DIAGNOSIS — D638 Anemia in other chronic diseases classified elsewhere: Secondary | ICD-10-CM | POA: Diagnosis present

## 2023-09-24 DIAGNOSIS — D631 Anemia in chronic kidney disease: Secondary | ICD-10-CM | POA: Diagnosis not present

## 2023-09-24 DIAGNOSIS — G9340 Encephalopathy, unspecified: Secondary | ICD-10-CM | POA: Diagnosis not present

## 2023-09-24 DIAGNOSIS — E669 Obesity, unspecified: Secondary | ICD-10-CM | POA: Diagnosis not present

## 2023-09-24 DIAGNOSIS — M109 Gout, unspecified: Secondary | ICD-10-CM | POA: Diagnosis present

## 2023-09-24 DIAGNOSIS — R509 Fever, unspecified: Secondary | ICD-10-CM | POA: Diagnosis not present

## 2023-09-24 DIAGNOSIS — R410 Disorientation, unspecified: Secondary | ICD-10-CM | POA: Diagnosis not present

## 2023-09-24 DIAGNOSIS — F32A Depression, unspecified: Secondary | ICD-10-CM | POA: Diagnosis not present

## 2023-09-24 DIAGNOSIS — N1832 Chronic kidney disease, stage 3b: Secondary | ICD-10-CM | POA: Diagnosis not present

## 2023-09-24 DIAGNOSIS — R569 Unspecified convulsions: Secondary | ICD-10-CM | POA: Diagnosis not present

## 2023-09-24 DIAGNOSIS — J9819 Other pulmonary collapse: Secondary | ICD-10-CM | POA: Diagnosis not present

## 2023-09-24 HISTORY — DX: Calculus of gallbladder without cholecystitis without obstruction: K80.20

## 2023-09-24 LAB — URINALYSIS, ROUTINE W REFLEX MICROSCOPIC
Bilirubin Urine: NEGATIVE
Glucose, UA: NEGATIVE mg/dL
Ketones, ur: NEGATIVE mg/dL
Leukocytes,Ua: NEGATIVE
Nitrite: NEGATIVE
Protein, ur: 100 mg/dL — AB
RBC / HPF: >50 RBC/hpf (ref 0–5)
Specific Gravity, Urine: 1.012 (ref 1.005–1.030)
pH: 5 (ref 5.0–8.0)

## 2023-09-24 LAB — HEPATIC FUNCTION PANEL
ALT: 12 U/L (ref 0–44)
AST: 23 U/L (ref 15–41)
Albumin: 2.3 g/dL — ABNORMAL LOW (ref 3.5–5.0)
Alkaline Phosphatase: 30 U/L — ABNORMAL LOW (ref 38–126)
Bilirubin, Direct: 0.4 mg/dL — ABNORMAL HIGH (ref 0.0–0.2)
Indirect Bilirubin: 0.6 mg/dL (ref 0.3–0.9)
Total Bilirubin: 1 mg/dL (ref <1.2)
Total Protein: 5.7 g/dL — ABNORMAL LOW (ref 6.5–8.1)

## 2023-09-24 LAB — PREPARE RBC (CROSSMATCH)

## 2023-09-24 LAB — CBC
HCT: 20.8 % — ABNORMAL LOW (ref 39.0–52.0)
Hemoglobin: 7 g/dL — ABNORMAL LOW (ref 13.0–17.0)
MCH: 32 pg (ref 26.0–34.0)
MCHC: 33.7 g/dL (ref 30.0–36.0)
MCV: 95 fL (ref 80.0–100.0)
Platelets: 148 10*3/uL — ABNORMAL LOW (ref 150–400)
RBC: 2.19 MIL/uL — ABNORMAL LOW (ref 4.22–5.81)
RDW: 14.3 % (ref 11.5–15.5)
WBC: 4.6 10*3/uL (ref 4.0–10.5)
nRBC: 0 % (ref 0.0–0.2)

## 2023-09-24 LAB — BASIC METABOLIC PANEL
Anion gap: 12 (ref 5–15)
BUN: 126 mg/dL — ABNORMAL HIGH (ref 8–23)
CO2: 15 mmol/L — ABNORMAL LOW (ref 22–32)
Calcium: 8.6 mg/dL — ABNORMAL LOW (ref 8.9–10.3)
Chloride: 101 mmol/L (ref 98–111)
Creatinine, Ser: 4.45 mg/dL — ABNORMAL HIGH (ref 0.61–1.24)
GFR, Estimated: 13 mL/min — ABNORMAL LOW (ref >60)
Glucose, Bld: 127 mg/dL — ABNORMAL HIGH (ref 70–99)
Potassium: 4.6 mmol/L (ref 3.5–5.1)
Sodium: 128 mmol/L — ABNORMAL LOW (ref 135–145)

## 2023-09-24 LAB — MRSA NEXT GEN BY PCR, NASAL: MRSA by PCR Next Gen: NOT DETECTED

## 2023-09-24 LAB — ABO/RH: ABO/RH(D): A POS

## 2023-09-24 LAB — GLUCOSE, CAPILLARY
Glucose-Capillary: 114 mg/dL — ABNORMAL HIGH (ref 70–99)
Glucose-Capillary: 102 mg/dL — ABNORMAL HIGH (ref 70–99)

## 2023-09-24 LAB — SODIUM, URINE, RANDOM: Sodium, Ur: 19 mmol/L

## 2023-09-24 LAB — CBG MONITORING, ED: Glucose-Capillary: 127 mg/dL — ABNORMAL HIGH (ref 70–99)

## 2023-09-24 MED ORDER — ISOSORBIDE MONONITRATE ER 60 MG PO TB24
30.0000 mg | ORAL_TABLET | Freq: Every day | ORAL | Status: DC
  Administered 2023-09-24: 30 mg via ORAL
  Filled 2023-09-24: qty 1

## 2023-09-24 MED ORDER — ACETAMINOPHEN 650 MG RE SUPP: 650.0000 mg | Freq: Four times a day (QID) | RECTAL | Status: DC | PRN

## 2023-09-24 MED ORDER — INSULIN ASPART 100 UNIT/ML IJ SOLN: 0.0000 [IU] | Freq: Three times a day (TID) | INTRAMUSCULAR | Status: DC

## 2023-09-24 MED ORDER — SODIUM CHLORIDE 0.9 % IV SOLN: INTRAVENOUS | Status: DC

## 2023-09-24 MED ORDER — ONDANSETRON HCL 4 MG PO TABS: 4.0000 mg | ORAL_TABLET | Freq: Four times a day (QID) | ORAL | Status: DC | PRN

## 2023-09-24 MED ORDER — PANTOPRAZOLE SODIUM 40 MG IV SOLR
40.0000 mg | Freq: Two times a day (BID) | INTRAVENOUS | Status: DC
  Administered 2023-09-25 – 2023-10-24 (×57): 40 mg via INTRAVENOUS
  Filled 2023-09-24 (×57): qty 10

## 2023-09-24 MED ORDER — LACTATED RINGERS IV BOLUS
1000.0000 mL | Freq: Once | INTRAVENOUS | Status: AC
  Administered 2023-09-24: 1000 mL via INTRAVENOUS

## 2023-09-24 MED ORDER — ORAL CARE MOUTH RINSE
15.0000 mL | OROMUCOSAL | Status: DC | PRN
Start: 2023-09-24 — End: 2023-10-09

## 2023-09-24 MED ORDER — SODIUM CHLORIDE 0.9% IV SOLUTION: Freq: Once | INTRAVENOUS | Status: AC

## 2023-09-24 MED ORDER — PANTOPRAZOLE SODIUM 40 MG IV SOLR
40.0000 mg | Freq: Once | INTRAVENOUS | Status: DC
  Administered 2023-09-24: 40 mg via INTRAVENOUS
  Filled 2023-09-24: qty 10

## 2023-09-24 MED ORDER — CYANOCOBALAMIN 500 MCG PO TABS
500.0000 ug | ORAL_TABLET | Freq: Every day | ORAL | Status: DC
Start: 2023-09-24 — End: 2023-10-11
  Administered 2023-09-24 – 2023-10-11 (×16): 500 ug via ORAL
  Filled 2023-09-24 (×18): qty 1

## 2023-09-24 MED ORDER — PANTOPRAZOLE SODIUM 40 MG IV SOLR: 40.0000 mg | INTRAVENOUS | Status: AC

## 2023-09-24 MED ORDER — CARVEDILOL 12.5 MG PO TABS
12.5000 mg | ORAL_TABLET | Freq: Two times a day (BID) | ORAL | Status: DC
  Administered 2023-09-24: 12.5 mg via ORAL
  Filled 2023-09-24: qty 1

## 2023-09-24 MED ORDER — ACETAMINOPHEN 325 MG PO TABS
650.0000 mg | ORAL_TABLET | Freq: Four times a day (QID) | ORAL | Status: DC | PRN
  Administered 2023-09-29 – 2023-10-20 (×5): 650 mg via ORAL
  Filled 2023-09-24 (×6): qty 2

## 2023-09-24 MED ORDER — CHLORHEXIDINE GLUCONATE CLOTH 2 % EX PADS
6.0000 | MEDICATED_PAD | Freq: Every day | CUTANEOUS | Status: DC
  Administered 2023-09-24 – 2023-10-20 (×23): 6 via TOPICAL

## 2023-09-24 MED ORDER — ONDANSETRON HCL 4 MG/2ML IJ SOLN
4.0000 mg | Freq: Four times a day (QID) | INTRAMUSCULAR | Status: DC | PRN
  Administered 2023-09-27 – 2023-10-10 (×7): 4 mg via INTRAVENOUS
  Filled 2023-09-24 (×7): qty 2

## 2023-09-24 MED ORDER — PIOGLITAZONE HCL 15 MG PO TABS
15.0000 mg | ORAL_TABLET | Freq: Every day | ORAL | Status: DC
  Administered 2023-09-24 – 2023-09-25 (×2): 15 mg via ORAL
  Filled 2023-09-24 (×2): qty 1

## 2023-09-24 NOTE — Progress Notes (Signed)
Made MD aware of bloody penile discharge. Scant in nature, stated it just started today. MD ordered more labs to be drawn, will pass on to night shift RN.

## 2023-09-24 NOTE — ED Triage Notes (Signed)
Pt arrives via POV after being seen by his pcp at [facility]. Pt reports ongoing generalized, weakness, fatigue, sob, and intermittent dark/green stools. Patient's family member reports he looks jaundiced. Pt is AxOx4.

## 2023-09-24 NOTE — ED Provider Notes (Signed)
[place] EMERGENCY DEPARTMENT AT [facility] Provider Note   CSN: [id number] Arrival date & time: 09/24/23  1101     History Chief Complaint  Patient presents with   Weakness   Dizziness   Shortness of Breath    HPI Joshua [name] is a 72 y.o. male presenting for generalized malaise fatigue hemoglobin dropped from 12-7 over the past 3 months, paleness, to discoloration and 28 pound weight loss over the last 3 months. Sent in by PCP.   Patient's recorded medical, surgical, social, medication list and allergies were reviewed in the Snapshot window as part of the initial history.   Review of Systems   Review of Systems  Constitutional:  Positive for fatigue. Negative for chills and fever.  HENT:  Negative for ear pain and sore throat.   Eyes:  Negative for pain and visual disturbance.  Respiratory:  Negative for cough and shortness of breath.   Cardiovascular:  Negative for chest pain and palpitations.  Gastrointestinal:  Positive for blood in stool. Negative for abdominal pain and vomiting.  Genitourinary:  Negative for dysuria and hematuria.  Musculoskeletal:  Negative for arthralgias and back pain.  Skin:  Negative for color change and rash.  Neurological:  Negative for seizures and syncope.  All other systems reviewed and are negative.   Physical Exam Updated Vital Signs BP (!) 130/53   Pulse 75   Temp (!) 97.3 F (36.3 C) (Oral)   Resp 17   Ht 5\' 8"  (1.727 m)   Wt 83 kg   SpO2 95%   BMI 27.83 kg/m  Physical Exam Vitals and nursing note reviewed.  Constitutional:      General: He is not in acute distress.    Appearance: He is well-developed.  HENT:     Head: Normocephalic and atraumatic.  Eyes:     Conjunctiva/sclera: Conjunctivae normal.  Cardiovascular:     Rate and Rhythm: Normal rate and regular rhythm.     Heart sounds: No murmur heard. Pulmonary:     Effort: Pulmonary effort is normal. No respiratory distress.     Breath sounds:  Normal breath sounds.  Abdominal:     Palpations: Abdomen is soft.     Tenderness: There is no abdominal tenderness.  Genitourinary:    Rectum: Guaiac result positive.  Musculoskeletal:        General: No swelling.     Cervical back: Neck supple.  Skin:    General: Skin is warm and dry.     Capillary Refill: Capillary refill takes less than 2 seconds.  Neurological:     Mental Status: He is alert.  Psychiatric:        Mood and Affect: Mood normal.      ED Course/ Medical Decision Making/ A&P    Procedures .Critical Care  Performed by: Glyn Ade, MD Authorized by: Glyn Ade, MD   Critical care provider statement:    Critical care time (minutes):  30   Critical care was necessary to treat or prevent imminent or life-threatening deterioration of the following conditions:  Circulatory failure   Critical care was time spent personally by me on the following activities:  Development of treatment plan with patient or surrogate, discussions with consultants, evaluation of patient's response to treatment, examination of patient, ordering and review of laboratory studies, ordering and review of radiographic studies, ordering and performing treatments and interventions, pulse oximetry, re-evaluation of patient's condition and review of old charts    Medications Ordered in  ED Medications  0.9 %  sodium chloride infusion (Manually program via Guardrails IV Fluids) (has no administration in time range)  pantoprazole (PROTONIX) injection 40 mg (40 mg Intravenous Not Given 09/24/23 1511)    Followed by  pantoprazole (PROTONIX) injection 40 mg (has no administration in time range)  acetaminophen (TYLENOL) tablet 650 mg (has no administration in time range)    Or  acetaminophen (TYLENOL) suppository 650 mg (has no administration in time range)  ondansetron (ZOFRAN) tablet 4 mg (has no administration in time range)    Or  ondansetron (ZOFRAN) injection 4 mg (has no  administration in time range)  Chlorhexidine Gluconate Cloth 2 % PADS 6 each (has no administration in time range)  lactated ringers bolus 1,000 mL (1,000 mLs Intravenous New Bag/Given 09/24/23 1507)    Medical Decision Making:   72 year old male with general failure to thrive presentation. Exam is grossly benign except for diffuse melena on exam.  Patient does not pursue medical care typically and has been lost to follow-up multiple times.  Never had a colonoscopy or seen a gastroenterologist. Given his findings on exam of gross melena, outpatient hemoglobin downtrend, patient likely suffering from upper GI bleed of uncertain etiology. Will check blood work for alternative pathology including bowel obstruction, mesenteric ischemia, myocardial disease, acute renal or hepatic etiology of symptoms. Reassessment: On reassessment lab work with anemia and AKI. Consulted gastroenterology who is in agreement with plan for admission to medicine, Eliquis hold and inpatient consultation for definitive management.  Disposition:   Based on the above findings, I believe this patient is stable for admission.    Patient/family educated about specific findings on our evaluation and explained exact reasons for admission.  Patient/family educated about clinical situation and time was allowed to answer questions.   Admission team communicated with and agreed with need for admission. Patient admitted. Patient ready to move at this time.     Emergency Department Medication Summary:   Medications  0.9 %  sodium chloride infusion (Manually program via Guardrails IV Fluids) (has no administration in time range)  pantoprazole (PROTONIX) injection 40 mg (40 mg Intravenous Not Given 09/24/23 1511)    Followed by  pantoprazole (PROTONIX) injection 40 mg (has no administration in time range)  acetaminophen (TYLENOL) tablet 650 mg (has no administration in time range)    Or  acetaminophen (TYLENOL) suppository 650 mg  (has no administration in time range)  ondansetron (ZOFRAN) tablet 4 mg (has no administration in time range)    Or  ondansetron (ZOFRAN) injection 4 mg (has no administration in time range)  Chlorhexidine Gluconate Cloth 2 % PADS 6 each (has no administration in time range)  lactated ringers bolus 1,000 mL (1,000 mLs Intravenous New Bag/Given 09/24/23 1507)        Clinical Impression:  1. UGIB (upper gastrointestinal bleed)      Admit   Final Clinical Impression(s) / ED Diagnoses Final diagnoses:  UGIB (upper gastrointestinal bleed)    Rx / DC Orders ED Discharge Orders     None         Glyn Ade, MD 09/24/23 [phone]

## 2023-09-24 NOTE — H&P (Signed)
History and Physical    Patient: Joshua [name]:[id number] DOB: [birth year] DOA: 09/24/2023 DOS: the patient was seen and examined on 09/24/2023 PCP: [name], MD  Patient coming from: Home  Chief Complaint:  Chief Complaint  Patient presents with   Weakness   Dizziness   Shortness of Breath   HPI: Joshua [name] is a 72 y.o. male with medical history significant of osteoarthritis, paroxysmal atrial fibrillation, atrial flutter, history of back surgery, back pain, history of CAD with stents placed in 2009, type 2 diabetes, elevated PSA, gout, hearing problem, hypertension, hyperlipidemia, prostatitis, renal insufficiency who was brought by his daughter and wife to the emergency department due to several weeks of progressively worse generalized weakness associated with dizziness and dyspnea.  He has been having melena for the past two weeks.  His hemoglobin has decreased from 13.1 about 6 months ago to 7.0 g/dL today. He has not had fever, chills, rhinorrhea, sore throat, wheezing or hemoptysis. No chest pain, palpitations, diaphoresis, PND, orthopnea, but develops pitting edema of the lower extremities. No abdominal pain, nausea, emesis, diarrhea, constipation, melena or hematochezia.  No flank pain, dysuria, frequency or hematuria.  No polyuria, polydipsia, polyphagia or blurred vision.   Lab work: CBC showed a white count of 4.6, hemoglobin 7.0 g/dL and platelets 811.  BMP showed a sodium 28, potassium 4.6, chloride 101 and CO2 15 mmol/L with a normal anion gap.  Glucose 127, BUN 126 and creatinine 4.45 mg/dL.  Calcium normalizes when corrected albumin level.  Total protein was 5.7 and albumin 2.3 g/dL.  Alkaline phosphatase was 30 units/L, total bilirubin 1.0 and direct bilirubin 0.4 mg/deciliter.  ED course: Initial vital signs were temperature 97.7 F, pulse 61, respirations 16, BP 110/58 mmHg O2 sat 96% on room air.  Patient received 1000 mL of LR bolus and 1 unit of PRBC.    Review of Systems: As mentioned in the history of present illness. All other systems reviewed and are negative. Past Medical History:  Diagnosis Date   Arthritis    Atrial fibrillation (HCC)    Atrial flutter (HCC)    Back pain    CAD (coronary artery disease)    stents placed 2009   Complication of anesthesia    woke up during surgery and ablation   DM (diabetes mellitus) (HCC)    type 2   Elevated PSA    Gout    Hearing problem    HTN (hypertension)    Hyperlipidemia    Myocardial infarct, old 2010   Prostatitis    Renal insufficiency    Past Surgical History:  Procedure Laterality Date   CARDIAC CATHETERIZATION  11/07/07   with 2 stents   COLONOSCOPY     Electrophysiologic study and RF catheter ablation   of AV node reentrant tachycardia.     EXTERNAL EAR SURGERY Right    cartilage   LEFT AND RIGHT HEART CATHETERIZATION WITH CORONARY/GRAFT ANGIOGRAM N/A 09/27/2014   Procedure: LEFT AND RIGHT HEART CATHETERIZATION WITH Isabel Caprice;  Surgeon: Lesleigh Noe, MD;  Location: [facility] CATH LAB;  Service: Cardiovascular;  Laterality: N/A;   LUMBAR LAMINECTOMY/DECOMPRESSION MICRODISCECTOMY Left 04/29/2018   Procedure: Left Lumbar Four-Five Laminectomy/Foraminotomy;  Surgeon: Tia Alert, MD;  Location: [facility] OR;  Service: Neurosurgery;  Laterality: Left;  Left Lumbar Four-Five Laminectomy/Foraminotomy   TOOTH EXTRACTION     VASECTOMY     Social History:  reports that he has never smoked. He has quit using smokeless tobacco.  His smokeless tobacco use included chew. He reports that he does not drink alcohol and does not use drugs.  Allergies  Allergen Reactions   Desipramine Hives and Itching   Niacin Itching    Family History  Problem Relation Age of Onset   Hypertension Mother    Gout Mother    Alzheimer's disease Mother    Heart failure Father    Heart disease Brother    Cancer Brother        type unknown    Prior to Admission medications   Medication  Sig Start Date End Date Taking? Authorizing Provider  carvedilol (COREG) 12.5 MG tablet TAKE 1 TABLET BY MOUTH TWICE A DAY 06/14/23   Marinus Maw, MD  colchicine 0.6 MG tablet 1 tablet Orally twice daily for 10 days 07/16/20   [provider]  dabigatran (PRADAXA) 150 MG CAPS capsule TAKE 1 CAPSULE BY MOUTH TWICE A DAY 08/25/23   Marinus Maw, MD  fenofibrate 160 MG tablet Take 160 mg by mouth daily. 06/16/20   [provider]  ipratropium (ATROVENT) 0.06 % nasal spray take 1-2 sprays into each nostril up to 3 times daily as needed Nasal 05/29/19   [provider]  isosorbide mononitrate (IMDUR) 30 MG 24 hr tablet Take 30 mg by mouth daily. 03/11/23   [provider]  nitroGLYCERIN (NITROSTAT) 0.4 MG SL tablet Place 0.4 mg under the tongue every 5 (five) minutes x 3 doses as needed for chest pain.    [provider]  olmesartan (BENICAR) 40 MG tablet Take 1 tablet (40 mg total) by mouth daily. 04/27/23   Marinus Maw, MD  pioglitazone (ACTOS) 45 MG tablet Take 45 mg by mouth every evening.     [provider]  rosuvastatin (CRESTOR) 20 MG tablet Take 20 mg by mouth daily. 03/30/20   [provider]  vitamin B-12 (CYANOCOBALAMIN) 500 MCG tablet Take 500 mcg by mouth daily.    [provider]    Physical Exam: Vitals:   09/24/23 1111 09/24/23 1114 09/24/23 1200  BP: (!) 110/58  (!) 106/33  Pulse: 61  67  Resp: 16  (!) 22  Temp: 97.7 F (36.5 C)    TempSrc: Oral    SpO2: 96%  94%  Weight:  83 kg   Height:  5\' 8"  (1.727 m)    Physical Exam Vitals and nursing note reviewed.  Constitutional:      General: He is awake. He is not in acute distress.    Appearance: He is well-developed. He is obese. He is ill-appearing.  HENT:     Head: Normocephalic.     Nose: Rhinorrhea present.     Mouth/Throat:     Mouth: Mucous membranes are dry.  Eyes:     General: No scleral icterus.    Pupils: Pupils are equal, round, and  reactive to light.  Neck:     Vascular: No JVD.  Cardiovascular:     Rate and Rhythm: Normal rate and regular rhythm.     Heart sounds: S1 normal and S2 normal.  Pulmonary:     Breath sounds: No wheezing, rhonchi or rales.  Abdominal:     General: Bowel sounds are normal. There is no distension.     Palpations: Abdomen is soft.     Tenderness: There is no abdominal tenderness. There is no guarding.  Musculoskeletal:     Cervical back: Neck supple.     Right lower leg: Edema present.  Left lower leg: Edema present.  Skin:    Coloration: Skin is pale.  Neurological:     Mental Status: He is alert and oriented to person, place, and time. Mental status is at baseline.     Cranial Nerves: No cranial nerve deficit.  Psychiatric:        Attention and Perception: He is inattentive.        Mood and Affect: Mood is depressed.        Speech: Speech is delayed.        Behavior: Behavior is slowed. Behavior is cooperative.    Data Reviewed:  Results are pending, will review when available. 09/18/2014 echocardiogram report ----------------------  Study Conclusions   - Left ventricle: The cavity size was normal. Wall thickness was    increased in a pattern of mild LVH. Systolic function was normal.    The estimated ejection fraction was in the range of 50% to 55%.    Wall motion was normal; there were no regional wall motion    abnormalities.  - Mitral valve: Calcified annulus. There was mild regurgitation.  - Left atrium: The atrium was moderately dilated.  - Pulmonary arteries: Systolic pressure was moderately increased.    PA peak pressure: 50 mm Hg (S).   Impressions:   - Normal LV function; mild LVH; moderate LAE; mild MR and TR;    moderately elevated pulmonary pressure.   EKG: Vent. rate 61 BPM PR interval 183 ms QRS duration 115 ms QT/QTcB 424/428 ms P-R-T axes 44 61 13 Sinus rhythm Incomplete left bundle branch block  Assessment and Plan: Principal Problem:    Symptomatic anemia   ABLA (acute blood loss anemia) In the setting of:   Gastrointestinal hemorrhage with melena Admit to stepdown/inpatient. Clear liquid diet. Keep NPO after midnight. Hold dabigatran. Continue IV fluids. Continue high-dose PPI. Transfuse 1 unit of PRBC. Monitor H&H. Transfuse further as needed. GI consult  appreciated.  Active Problems:   Hyponatremia In the setting of recent blood loss. He also has bilateral lower extremity edema. Check urine sodium level. Continue blood transfusions and IV fluids. Follow-up sodium tomorrow with morning labs.    AKI (acute kidney injury) (HCC)  Superimposed on: CKD stage 3a, GFR 45-59 ml/min (HCC)  Continue IV fluids. Hold ARB/ACE. Avoid hypotension. Avoid nephrotoxins. Monitor intake and output. Monitor renal function electrolytes.    Type 2 diabetes mellitus (HCC) Carbohydrate modified diet. CBG monitoring with RI SS. Check hemoglobin A1c. Continue Actos 15 mg p.o. daily.    Hyperlipidemia Check total CK level. Continue rosuvastatin if normal.    Essential hypertension Hold olmesartan. Continue carvedilol 12.5 mg p.o. twice daily.    Coronary artery disease due to lipid rich plaque Holding DOAC. Continue beta-blocker. Continue statin after checking total CK if level normal.    Paroxysmal atrial fibrillation (HCC) Holding DOAC. Continue beta-blocker for rate control.    Pulmonary hypertension (HCC) Continue Imdur 30 mg p.o. daily. Continue antihypertensives. I will check echocardiogram.    Protein-calorie malnutrition, severe (HCC) In the setting of ABLA. May benefit from protein supplementation. Consider nutritional services evaluation. Follow-up albumin level.    Thrombocytosis In the setting of anemia. Monitor platelet count.     Advance Care Planning:   Code Status: Full Code   Consults:  [facility] Charlott Rakes, MD).  Family Communication: His wife and daughter were at  bedside.  Severity of Illness: The appropriate patient status for this patient is INPATIENT. Inpatient status is judged to be reasonable  and necessary in order to provide the required intensity of service to ensure the patient's safety. The patient's presenting symptoms, physical exam findings, and initial radiographic and laboratory data in the context of their chronic comorbidities is felt to place them at high risk for further clinical deterioration. Furthermore, it is not anticipated that the patient will be medically stable for discharge from the hospital within 2 midnights of admission.   * I certify that at the point of admission it is my clinical judgment that the patient will require inpatient hospital care spanning beyond 2 midnights from the point of admission due to high intensity of service, high risk for further deterioration and high frequency of surveillance required.*  Author: Bobette Mo, MD 09/24/2023 2:15 PM  For on call review [email or web address].   This document was prepared using Dragon voice recognition software and may contain some unintended transcription errors.

## 2023-09-25 ENCOUNTER — Encounter (HOSPITAL_COMMUNITY): Payer: Self-pay | Admitting: Anesthesiology

## 2023-09-25 ENCOUNTER — Inpatient Hospital Stay (HOSPITAL_COMMUNITY): Payer: Medicare HMO

## 2023-09-25 ENCOUNTER — Encounter (HOSPITAL_COMMUNITY)
Admission: EM | Disposition: A | Discharge status: Other Acute Inpatient Hospital | Payer: Self-pay | Source: Ambulatory Visit | Attending: Student

## 2023-09-25 DIAGNOSIS — I2583 Coronary atherosclerosis due to lipid rich plaque: Secondary | ICD-10-CM

## 2023-09-25 DIAGNOSIS — D62 Acute posthemorrhagic anemia: Secondary | ICD-10-CM | POA: Diagnosis not present

## 2023-09-25 DIAGNOSIS — N179 Acute kidney failure, unspecified: Secondary | ICD-10-CM

## 2023-09-25 DIAGNOSIS — D649 Anemia, unspecified: Secondary | ICD-10-CM

## 2023-09-25 DIAGNOSIS — Z7901 Long term (current) use of anticoagulants: Secondary | ICD-10-CM | POA: Diagnosis not present

## 2023-09-25 DIAGNOSIS — I2510 Atherosclerotic heart disease of native coronary artery without angina pectoris: Secondary | ICD-10-CM

## 2023-09-25 DIAGNOSIS — K921 Melena: Secondary | ICD-10-CM | POA: Diagnosis not present

## 2023-09-25 DIAGNOSIS — I10 Essential (primary) hypertension: Secondary | ICD-10-CM

## 2023-09-25 DIAGNOSIS — N1831 Chronic kidney disease, stage 3a: Secondary | ICD-10-CM

## 2023-09-25 DIAGNOSIS — I4891 Unspecified atrial fibrillation: Secondary | ICD-10-CM | POA: Diagnosis not present

## 2023-09-25 DIAGNOSIS — E871 Hypo-osmolality and hyponatremia: Secondary | ICD-10-CM

## 2023-09-25 DIAGNOSIS — I480 Paroxysmal atrial fibrillation: Secondary | ICD-10-CM

## 2023-09-25 LAB — COMPREHENSIVE METABOLIC PANEL
ALT: 11 U/L (ref 0–44)
AST: 20 U/L (ref 15–41)
Albumin: 2.1 g/dL — ABNORMAL LOW (ref 3.5–5.0)
Alkaline Phosphatase: 27 U/L — ABNORMAL LOW (ref 38–126)
Anion gap: 10 (ref 5–15)
BUN: 118 mg/dL — ABNORMAL HIGH (ref 8–23)
CO2: 16 mmol/L — ABNORMAL LOW (ref 22–32)
Calcium: 8.4 mg/dL — ABNORMAL LOW (ref 8.9–10.3)
Chloride: 105 mmol/L (ref 98–111)
Creatinine, Ser: 3.98 mg/dL — ABNORMAL HIGH (ref 0.61–1.24)
GFR, Estimated: 15 mL/min — ABNORMAL LOW (ref >60)
Glucose, Bld: 86 mg/dL (ref 70–99)
Potassium: 4.3 mmol/L (ref 3.5–5.1)
Sodium: 131 mmol/L — ABNORMAL LOW (ref 135–145)
Total Bilirubin: 1 mg/dL (ref <1.2)
Total Protein: 5.2 g/dL — ABNORMAL LOW (ref 6.5–8.1)

## 2023-09-25 LAB — ECHOCARDIOGRAM COMPLETE
Area-P 1/2: 2.95 cm2
Height: 68 in
S' Lateral: 2.4 cm
Weight: 3915.37 [oz_av]

## 2023-09-25 LAB — GLUCOSE, CAPILLARY
Glucose-Capillary: 93 mg/dL (ref 70–99)
Glucose-Capillary: 97 mg/dL (ref 70–99)
Glucose-Capillary: 102 mg/dL — ABNORMAL HIGH (ref 70–99)

## 2023-09-25 LAB — IRON AND TIBC
Iron: 47 ug/dL (ref 45–182)
Saturation Ratios: 17 % — ABNORMAL LOW (ref 17.9–39.5)
TIBC: 283 ug/dL (ref 250–450)
UIBC: 236 ug/dL

## 2023-09-25 LAB — CBC
HCT: 22.2 % — ABNORMAL LOW (ref 39.0–52.0)
Hemoglobin: 7.3 g/dL — ABNORMAL LOW (ref 13.0–17.0)
MCH: 31.6 pg (ref 26.0–34.0)
MCHC: 32.9 g/dL (ref 30.0–36.0)
MCV: 96.1 fL (ref 80.0–100.0)
Platelets: 130 10*3/uL — ABNORMAL LOW (ref 150–400)
RBC: 2.31 MIL/uL — ABNORMAL LOW (ref 4.22–5.81)
RDW: 14.7 % (ref 11.5–15.5)
WBC: 4.1 10*3/uL (ref 4.0–10.5)
nRBC: 0 % (ref 0.0–0.2)

## 2023-09-25 LAB — FOLATE: Folate: 5.6 ng/mL — ABNORMAL LOW (ref >5.9)

## 2023-09-25 LAB — RETICULOCYTES
Immature Retic Fract: 26.3 % — ABNORMAL HIGH (ref 2.3–15.9)
RBC.: 2.31 MIL/uL — ABNORMAL LOW (ref 4.22–5.81)
Retic Count, Absolute: 63.5 10*3/uL (ref 19.0–186.0)
Retic Ct Pct: 2.8 % (ref 0.4–3.1)

## 2023-09-25 LAB — PROTIME-INR
INR: 9.5 (ref 0.8–1.2)
INR: 2.5 — ABNORMAL HIGH (ref 0.8–1.2)
Prothrombin Time: 76.7 s — ABNORMAL HIGH (ref 11.4–15.2)
Prothrombin Time: 26.9 s — ABNORMAL HIGH (ref 11.4–15.2)

## 2023-09-25 LAB — CK: Total CK: 122 U/L (ref 49–397)

## 2023-09-25 LAB — APTT: aPTT: 84 s — ABNORMAL HIGH (ref 24–36)

## 2023-09-25 LAB — FERRITIN: Ferritin: 470 ng/mL — ABNORMAL HIGH (ref 24–336)

## 2023-09-25 LAB — PREPARE RBC (CROSSMATCH)

## 2023-09-25 LAB — HEMOGLOBIN AND HEMATOCRIT, BLOOD
HCT: 22.5 % — ABNORMAL LOW (ref 39.0–52.0)
HCT: 24.1 % — ABNORMAL LOW (ref 39.0–52.0)
Hemoglobin: 7.5 g/dL — ABNORMAL LOW (ref 13.0–17.0)
Hemoglobin: 7.8 g/dL — ABNORMAL LOW (ref 13.0–17.0)

## 2023-09-25 LAB — VITAMIN B12: Vitamin B-12: 2137 pg/mL — ABNORMAL HIGH (ref 180–914)

## 2023-09-25 SURGERY — CANCELLED PROCEDURE

## 2023-09-25 MED ORDER — VITAMIN K1 10 MG/ML IJ SOLN
1.0000 mg | Freq: Once | INTRAVENOUS | Status: AC
  Administered 2023-09-25: 1 mg via INTRAVENOUS
  Filled 2023-09-25: qty 0.1

## 2023-09-25 MED ORDER — IDARUCIZUMAB 2.5 GM/50ML IV SOLN
5.0000 g | INTRAVENOUS | Status: AC
Start: 2023-09-25 — End: 2023-09-25
  Administered 2023-09-25: 5 g via INTRAVENOUS
  Filled 2023-09-25: qty 100

## 2023-09-25 MED ORDER — SODIUM CHLORIDE 0.45 % IV SOLN
INTRAVENOUS | Status: AC
Start: 2023-09-25 — End: 2023-09-26
  Filled 2023-09-25 (×4): qty 75

## 2023-09-25 MED ORDER — INSULIN ASPART 100 UNIT/ML IJ SOLN: 0.0000 [IU] | Freq: Three times a day (TID) | INTRAMUSCULAR | Status: DC

## 2023-09-25 MED ORDER — MIDODRINE HCL 5 MG PO TABS
5.0000 mg | ORAL_TABLET | Freq: Three times a day (TID) | ORAL | Status: DC
  Administered 2023-09-25 – 2023-09-26 (×2): 5 mg via ORAL
  Filled 2023-09-25 (×2): qty 1

## 2023-09-25 MED ORDER — SODIUM CHLORIDE 0.9% IV SOLUTION: Freq: Once | INTRAVENOUS | Status: AC

## 2023-09-25 SURGICAL SUPPLY — 12 items
BLOCK BITE 60FR ADLT L/F BLUE (MISCELLANEOUS) ×2
ELECT REM PT RETURN 9FT ADLT (ELECTROSURGICAL)
FORCEP RJ3 GP 1.8X160 W-NEEDLE (CUTTING FORCEPS)
FORCEPS BIOP RAD 4 LRG CAP 4 (CUTTING FORCEPS)
NEEDLE SCLEROTHERAPY 25GX240 (NEEDLE)
PROBE APC STR FIRE (PROBE)
PROBE INJECTION GOLD 7FR (MISCELLANEOUS)
SNARE SHORT THROW 13M SML OVAL (MISCELLANEOUS)
SYR 50ML LL SCALE MARK (SYRINGE)
TUBING ENDO SMARTCAP PENTAX (MISCELLANEOUS) ×4
TUBING IRRIGATION ENDOGATOR (MISCELLANEOUS) ×2
WATER STERILE IRR 1000ML POUR (IV SOLUTION)

## 2023-09-25 NOTE — OR Nursing (Signed)
Report received from Floor RN. Floor RN states  -Pt has been NPO  -Penile bleeding -Hemoglobin 7.3 after one unit of blood  -INR 9.5 started vitamin K infusion  -Pt needs to travel by bed and is on 2L [place].  -Pt has 22g IV; ENDO RN requested 20g or greater for anesthesia purpose prior to coming down for ENDO.  -Floor RN will attempt to obtain consent.  Lane Hacker, RN

## 2023-09-25 NOTE — Plan of Care (Signed)
  Problem: Clinical Measurements: Goal: Respiratory complications will improve Outcome: Progressing Goal: Cardiovascular complication will be avoided Outcome: Progressing   Problem: Safety: Goal: Ability to remain free from injury will improve Outcome: Progressing   Problem: Tissue Perfusion: Goal: Adequacy of tissue perfusion will improve Outcome: Progressing

## 2023-09-25 NOTE — Consult Note (Addendum)
Consultation  Referring Provider: TR[facility]/Gonfa Primary Care Physician:  [name], MD Primary Gastroenterologist:  Dr. Yancey Flemings  Reason for Consultation: Melena, anemia  HPI: Joshua [name] is a 72 y.o. male who was admitted yesterday through the emergency room with complaints of progressive weakness dizziness and shortness of breath.  On further history he related that he had been having very dark stools over the past couple of weeks. He has been on chronic Pradaxa since 2009, has history of coronary artery disease with stents at that time, atrial fibrillation/flutter, diabetes mellitus, hypertension, osteoarthritis and chronic kidney disease Patient denies any abdominal pain nausea or vomiting, denies any heartburn or indigestion, no dysphagia or odynophagia. He has not had prior EGD, did have colonoscopy with Dr. Marina Goodell last in 2015 with finding of mild diverticulosis in the sigmoid colon and had 2 diminutive polyps removed 1 of which was a tubular adenoma and 1 hyperplastic.   Labs in the ER yesterday revealed WBC of 4.1/hemoglobin 7.3/hematocrit 22.2/MCV 86/platelets 130 BUN 118/creatinine 3.98 T. bili 0.4/alk phos 30/AST 23/ALT 12  INR was done last p.m. markedly elevated at 9.5/pro time 76.7   received 1 unit of packed RBCs last night, hemoglobin this morning 7.3/hematocrit 22.2  He still feels poorly and weak, has not had any bowel movement since admission  Given vitamin K and just received Praxbind reversal agent for Pradaxa  He was having some bleeding from his IV sites and hematuria overnight.    Past Medical History:  Diagnosis Date   Arthritis    Atrial fibrillation (HCC)    Atrial flutter (HCC)    Back pain    CAD (coronary artery disease)    stents placed 2009   Complication of anesthesia    woke up during surgery and ablation   DM (diabetes mellitus) (HCC)    type 2   Elevated PSA    Gout    Hearing problem    HTN (hypertension)     Hyperlipidemia    Myocardial infarct, old 2010   Prostatitis    Renal insufficiency     Past Surgical History:  Procedure Laterality Date   CARDIAC CATHETERIZATION  11/07/07   with 2 stents   COLONOSCOPY     Electrophysiologic study and RF catheter ablation   of AV node reentrant tachycardia.     EXTERNAL EAR SURGERY Right    cartilage   LEFT AND RIGHT HEART CATHETERIZATION WITH CORONARY/GRAFT ANGIOGRAM N/A 09/27/2014   Procedure: LEFT AND RIGHT HEART CATHETERIZATION WITH Isabel Caprice;  Surgeon: Lesleigh Noe, MD;  Location[facility];  Service: Cardiovascular;  Laterality: N/A;   LUMBAR LAMINECTOMY/DECOMPRESSION MICRODISCECTOMY Left 04/29/2018   Procedure: Left Lumbar Four-Five Laminectomy/Foraminotomy;  Surgeon: Tia Alert, MD;  Location: [facility] OR;  Service: Neurosurgery;  Laterality: Left;  Left Lumbar Four-Five Laminectomy/Foraminotomy   TOOTH EXTRACTION     VASECTOMY      Prior to Admission medications   Medication Sig Start Date End Date Taking? Authorizing Provider  carvedilol (COREG) 12.5 MG tablet TAKE 1 TABLET BY MOUTH TWICE A DAY 06/14/23  Yes Marinus Maw, MD  dabigatran (PRADAXA) 150 MG CAPS capsule TAKE 1 CAPSULE BY MOUTH TWICE A DAY 08/25/23  Yes Marinus Maw, MD  fenofibrate 160 MG tablet Take 160 mg by mouth daily. 06/16/20  Yes [provider]  ipratropium (ATROVENT) 0.06 % nasal spray Place 1 spray into both nostrils 3 (three) times daily as needed for rhinitis. 05/29/19  Yes [provider]  isosorbide mononitrate (IMDUR) 30 MG 24 hr tablet Take 30 mg by mouth daily. 03/11/23  Yes [provider]  nitroGLYCERIN (NITROSTAT) 0.4 MG SL tablet Place 0.4 mg under the tongue every 5 (five) minutes x 3 doses as needed for chest pain.   Yes [provider]  olmesartan (BENICAR) 40 MG tablet Take 1 tablet (40 mg total) by mouth daily. 04/27/23  Yes Marinus Maw, MD  pioglitazone (ACTOS) 15 MG tablet Take 15 mg by mouth  daily.   Yes [provider]  rosuvastatin (CRESTOR) 20 MG tablet Take 20 mg by mouth daily. 03/30/20  Yes [provider]  TYLENOL 8 HOUR ARTHRITIS PAIN 650 MG CR tablet Take 650-1,300 mg by mouth every 8 (eight) hours as needed for pain.   Yes [provider]  vitamin B-12 (CYANOCOBALAMIN) 500 MCG tablet Take 500 mcg by mouth daily.   Yes [provider]    Current Facility-Administered Medications  Medication Dose Route Frequency Provider Last Rate Last Admin   acetaminophen (TYLENOL) tablet 650 mg  650 mg Oral Q6H PRN Bobette Mo, MD       Or   acetaminophen (TYLENOL) suppository 650 mg  650 mg Rectal Q6H PRN Bobette Mo, MD       Chlorhexidine Gluconate Cloth 2 % PADS 6 each  6 each Topical Daily Bobette Mo, MD   6 each at 09/24/23 1642   cyanocobalamin (VITAMIN B12) tablet 500 mcg  500 mcg Oral Daily Bobette Mo, MD   500 mcg at 09/25/23 0900   insulin aspart (novoLOG) injection 0-9 Units  0-9 Units Subcutaneous TID WC Bobette Mo, MD       ondansetron [facility]) tablet 4 mg  4 mg Oral Q6H PRN Bobette Mo, MD       Or   ondansetron [facility]) injection 4 mg  4 mg Intravenous Q6H PRN Bobette Mo, MD       Oral care mouth rinse  15 mL Mouth Rinse PRN Bobette Mo, MD       pantoprazole (PROTONIX) injection 40 mg  40 mg Intravenous Q12H Bobette Mo, MD   40 mg at 09/25/23 0900   pioglitazone (ACTOS) tablet 15 mg  15 mg Oral Daily Bobette Mo, MD   15 mg at 09/25/23 0900   sodium bicarbonate 75 mEq in sodium chloride 0.45 % 1,075 mL infusion   Intravenous Continuous Almon Hercules, MD        Allergies as of 09/24/2023 - Review Complete 09/24/2023  Allergen Reaction Noted   Desipramine Hives and Itching 12/20/2017   Niacin Itching     Family History  Problem Relation Age of Onset   Hypertension Mother    Gout Mother    Alzheimer's disease Mother    Heart failure Father     Heart disease Brother    Cancer Brother        type unknown    Social History   Socioeconomic History   Marital status: Married    Spouse name: Not on file   Number of children: 2   Years of education: Not on file   Highest education level: Not on file  Occupational History   Occupation: SERV TECH    Employer: [employer]  Tobacco Use   Smoking status: Never   Smokeless tobacco: Former    Types: Chew  Substance and Sexual Activity   Alcohol use: No  Drug use: No   Sexual activity: Not on file  Other Topics Concern   Not on file  Social History Narrative   Not on file   Social Drivers of Health   Financial Resource Strain: Not on file  Food Insecurity: No Food Insecurity (09/24/2023)   Hunger Vital Sign    Worried About Running Out of Food in the Last Year: Never true    Ran Out of Food in the Last Year: Never true  Transportation Needs: No Transportation Needs (09/24/2023)   PRAPARE - Administrator, Civil Service (Medical): No    Lack of Transportation (Non-Medical): No  Physical Activity: Not on file  Stress: Not on file  Social Connections: Not on file  Intimate Partner Violence: Not At Risk (09/24/2023)   Humiliation, Afraid, Rape, and Kick questionnaire    Fear of Current or Ex-Partner: No    Emotionally Abused: No    Physically Abused: No    Sexually Abused: No    Review of Systems: Pertinent positive and negative review of systems were noted in the above HPI section.  All other review of systems was otherwise negative.   Physical Exam: Vital signs in last 24 hours: Temp:  [95.9 F (35.5 C)-98.6 F (37 C)] 98.6 F (37 C) (12/28 0800) Pulse Rate:  [61-79] 71 (12/28 0745) Resp:  [12-29] 28 (12/28 0745) BP: (90-146)/(28-58) 122/39 (12/28 0745) SpO2:  [94 %-99 %] 97 % (12/28 0745) Weight:  [83 kg-111 kg] 111 kg (12/27 1645) Last BM Date : 09/24/23 General:   Alert,  Well-developed, well-nourished pale, acutely ill-appearing older white  male, pleasant and cooperative in NAD, family at bedside Head:  Normocephalic and atraumatic. Eyes:  Sclera clear, no icterus.   Conjunctiva pale Ears:  Normal auditory acuity. Nose:  No deformity, discharge,  or lesions. Mouth:  No deformity or lesions.   Neck:  Supple; no masses or thyromegaly. Lungs:  Clear throughout to auscultation.   No wheezes, crackles, or rhonchi. Heart:  Regular rate and rhythm; no murmurs, clicks, rubs,  or gallops. Abdomen:  Soft, obese nontender, BS active,nonpalp mass or hsm.   Rectal: Not done, documented melena, heme positive in ER yesterday Msk:  Symmetrical without gross deformities. . Pulses:  Normal pulses noted. Extremities:  Without clubbing or edema. Neurologic:  Alert and  oriented x4;  grossly normal neurologically. Skin:  Intact without significant lesions or rashes.. Psych:  Alert and cooperative. Normal mood and affect.  Intake/Output from previous day: 12/27 0701 - 12/28 0700 In: 1559.8 [P.O.:240; I.V.:4.8; Blood:315; IV Piggyback:1000] Out: 900 [Urine:900] Intake/Output this shift: No intake/output data recorded.  Lab Results: Recent Labs    09/24/23 1227 09/24/23 2348 09/25/23 0317  WBC 4.6  --  4.1  HGB 7.0* 7.5* 7.3*  HCT 20.8* 22.5* 22.2*  PLT 148*  --  130*   BMET Recent Labs    09/24/23 1227 09/25/23 0317  NA 128* 131*  K 4.6 4.3  CL 101 105  CO2 15* 16*  GLUCOSE 127* 86  BUN 126* 118*  CREATININE 4.45* 3.98*  CALCIUM 8.6* 8.4*   LFT Recent Labs    09/24/23 1227 09/25/23 0317  PROT 5.7* 5.2*  ALBUMIN 2.3* 2.1*  AST 23 20  ALT 12 11  ALKPHOS 30* 27*  BILITOT 1.0 1.0  BILIDIR 0.4*  --   IBILI 0.6  --    PT/INR Recent Labs    09/24/23 2348  LABPROT 76.7*  INR 9.5*  IMPRESSION:  #72 72 year old white male with history of coronary artery disease status post remote stents on chronic Pradaxa admitted yesterday through the emergency room with progressive weakness dizziness and shortness of  breath Also had complaints of dark stool over the past 2 weeks  Labs on admit revealed hemoglobin of 7 down from his baseline of 13 BUN 126/creatinine 4.45  Patient has no active GI symptoms no prior history of GI bleeding  Etiology of bleeding is not clear, rule out esophagitis, gastropathy, peptic ulcer disease, neoplasm  #2 marked coagulopathy secondary to Pradaxa-being reversed this a.m. #3 anemia acute secondary to acute/subacute GI blood loss #4 chronic kidney disease #5 Hypertension #6 Diabetes mellitus #7 Atrial fib/flutter #8 hematuria and oozing from IV site secondary to #2   PLAN: Clear liquid diet today, n.p.o. after midnight IV PPI twice daily Need to trend hemoglobin and transfuse for hemoglobin less than 7 expect he will need at least 1 more unit today Repeat INR early a.m. Will tentatively plan for EGD with Dr. Russella Dar in a.m. tomorrow.  Procedure was discussed in detail with the patient including indications risks and benefits and he is agreeable to proceed. GI will follow with you   Amy EsterwoodPA-C  09/25/2023, 10:56 AM   Attending Physician Note   I have taken a history, reviewed the chart and examined the patient. I performed a substantive portion of this encounter, including complete performance of at least one of the key components, in conjunction with the APP. I agree with the APP's note, impression and recommendations with my edits. My additional impressions and recommendations are as follows.   Melena for 2 weeks with ABL anemia - R/O ulcer, AVMs, esophagitis, gastritis, neoplasm, etc. PPI bid. Trend CBC. Transfuse to maintain Hgb > 7. EGD tomorrow morning pending anesthesia availability.   Pradaxa anticoagulation being reversed  CKD with AKI   Claudette Head, MD [facility] See AMION, [facility], for our on call provider

## 2023-09-25 NOTE — Progress Notes (Signed)
  Echocardiogram 2D Echocardiogram has been performed.  Leda Roys RDCS 09/25/2023, 9:55 AM

## 2023-09-25 NOTE — Progress Notes (Signed)
PROGRESS NOTE  Joshua [name] ZOX:[id number] DOB: 1951-05-20   PCP: [name], MD  Patient is from: Home.  Lives with his wife.  Ambulates holding to furniture and walls at baseline  DOA: 09/24/2023 LOS: 1  Chief complaints Chief Complaint  Patient presents with   Weakness   Dizziness   Shortness of Breath     Brief Narrative / Interim history: 72 year old M with PMH of PAF/flutter on Pradaxa, CAD s/p stents in 2009, DM-2, CKD-3A, HTN, HLD and osteoarthritis presenting with progressive generalized weakness, dizziness, dyspnea, melena and edema for weeks, and admitted with symptomatic anemia in the setting of GI bleed, AKI with uremia and hyponatremia.  In ED, vitals stable. Na 128. Cr 4.45 (1.4 on 8/13).  BUN 126.  Bicarb 15.  AG 12.  Hgb 7.0.  Platelets 148.  UA with microscopic hematuria.  One units of blood ordered.  Started on IV Protonix.  GI consulted.  EGD postponed due to elevated INR.   Subjective: Seen and examined earlier this morning.  Patient had epistaxis, and bleeding from previous IV line and gross hematuria.  No further bowel movements.  Denies chest pain or shortness of breath.  Reports some nausea but no vomiting or diarrhea.  Denies NSAID use.  Last dose of Pradaxa the morning of 12/27.  Patient's wife and daughter at bedside.  Objective: Vitals:   09/25/23 1030 09/25/23 1045 09/25/23 1100 09/25/23 1200  BP: (!) 106/36 (!) 114/35 (!) 131/41   Pulse: (!) 58 (!) 56 73   Resp: (!) 21 (!) 21 (!) 24   Temp:    98.2 F (36.8 C)  TempSrc:    Axillary  SpO2: 94% 92% 95%   Weight:      Height:        Examination:  GENERAL: No apparent distress.  Nontoxic. HEENT: MMM.  Vision and hearing grossly intact.  Some epistaxis.  NECK: Supple.  No apparent JVD.  RESP:  No IWOB.  Fair aeration bilaterally. CVS:  RRR. Heart sounds normal.  ABD/GI/GU: BS+. Abd soft, NTND.  MSK/EXT:  Moves extremities. No apparent deformity. No edema.  SKIN: no apparent skin  lesion or wound NEURO: Awake, alert and oriented appropriately.  No apparent focal neuro deficit. PSYCH: Calm. Normal affect.   Procedures:  None  Microbiology summarized: MRSA PCR screen nonreactive  Assessment and plan: Symptomatic acute blood loss anemia due to upper GI bleed: Patient is on Pradaxa for A-fib/flutter.  Hgb 7.0 (was 13.1 on 6/20).  Had melena for over 2 weeks.  Symptomatic.  Transfused 1 unit without significant improvement partly due to hemodilution from IV fluid.  Has history of CAD. Recent Labs    03/18/23 1026 09/24/23 1227 09/24/23 2348 09/25/23 0317  HGB 13.1 7.0* 7.5* 7.3*  -Monitor H&H.  Goal Hgb > 8.0 given history of CAD.  Transfuse additional 1 unit -Will give Praxibind given ongoing epistaxis, hematuria ambulating from IV sites. -Continue IV Protonix -Check anemia panel -Will give Aranesp after IV iron -Monitor H&H -Continue holding Pradaxa. -On clear liquid diet per GI.  AKI on CKD-3A/uremia: ATN from anemia?  Takes Benicar at home.  Denies NSAID use.  UA with hematuria.  Improving. Recent Labs    03/18/23 1026 05/11/23 1007 09/24/23 1227 09/25/23 0317  BUN 29* 24 126* 118*  CREATININE 1.34* 1.41* 4.45* 3.98*  -Change IV fluid to sodium bicarbonate given metabolic acidosis -Avoid nephrotoxic meds -Manage anemia as above -Continue monitoring -Strict intake and output with intermittent bladder  scan -Renal ultrasound  Hyponatremia: Likely due to renal failure. -Continue monitoring   DM-2 with CKD-3A: Last A1c 6.0% in 2019. Recent Labs  Lab 09/24/23 1128 09/24/23 1736 09/24/23 2108 09/25/23 0800 09/25/23 1154  GLUCAP 127* 114* 102* 93 97  -Decrease SSI to very sensitive -Hold home meds. -Follow hemoglobin A1c   Essential hypertension: Soft BP with wide pulse pressure.  TTE without significant finding. -Hold home losartan, Imdur and Coreg   History of CAD s/p stent in 2009: No anginal symptoms. -Hold cardiac meds for now    Paroxysmal atrial fibrillation (HCC) -Holding DOAC. -Holding Coreg due to soft BP  Epistaxis -Praxibind to reverse Pradaxa. -Advised patient not to pick his nose.    Thrombocytopenia: Mild -Monitor  Physical deconditioning -PT/OT  Hyperlipidemia -Continue rosuvastatin   Elevated INR: 9.5.  Unclear significance of this with Pradaxa.  Received IV vitamin K. -Will reverse Pradaxa as above -Check APTT  Morbid obesity Body mass index is 37.21 kg/m.          DVT prophylaxis:  SCDs Start: 09/24/23 1503  Code Status: Full code Family Communication: Dated patient's wife and daughter at bedside Level of care: Stepdown Status is: Inpatient Remains inpatient appropriate because: Symptomatic acute blood loss anemia and AKI with uremia   Final disposition: Likely home once medically stable Consultants:  Gastroenterology  55 minutes with more than 50% spent in reviewing records, counseling patient/family and coordinating care.   Sch Meds:  Scheduled Meds:  sodium chloride   Intravenous Once   Chlorhexidine Gluconate Cloth  6 each Topical Daily   cyanocobalamin  500 mcg Oral Daily   insulin aspart  0-6 Units Subcutaneous TID WC   pantoprazole (PROTONIX) IV  40 mg Intravenous Q12H   Continuous Infusions:  sodium bicarbonate 75 mEq in sodium chloride 0.45 % 1,075 mL infusion 125 mL/hr at 09/25/23 1103   PRN Meds:.acetaminophen **OR** acetaminophen, ondansetron **OR** ondansetron (ZOFRAN) IV, mouth rinse  Antimicrobials: Anti-infectives (From admission, onward)    None        I have personally reviewed the following labs and images: CBC: Recent Labs  Lab 09/24/23 1227 09/24/23 2348 09/25/23 0317  WBC 4.6  --  4.1  HGB 7.0* 7.5* 7.3*  HCT 20.8* 22.5* 22.2*  MCV 95.0  --  96.1  PLT 148*  --  130*   BMP &GFR Recent Labs  Lab 09/24/23 1227 09/25/23 0317  NA 128* 131*  K 4.6 4.3  CL 101 105  CO2 15* 16*  GLUCOSE 127* 86  BUN 126* 118*  CREATININE  4.45* 3.98*  CALCIUM 8.6* 8.4*   Estimated Creatinine Clearance: 19.7 mL/min (A) (by C-G formula based on SCr of 3.98 mg/dL (H)). Liver & Pancreas: Recent Labs  Lab 09/24/23 1227 09/25/23 0317  AST 23 20  ALT 12 11  ALKPHOS 30* 27*  BILITOT 1.0 1.0  PROT 5.7* 5.2*  ALBUMIN 2.3* 2.1*   No results for input(s): "LIPASE", "AMYLASE" in the last 168 hours. No results for input(s): "AMMONIA" in the last 168 hours. Diabetic: No results for input(s): "HGBA1C" in the last 72 hours. Recent Labs  Lab 09/24/23 1128 09/24/23 1736 09/24/23 2108 09/25/23 0800 09/25/23 1154  GLUCAP 127* 114* 102* 93 97   Cardiac Enzymes: Recent Labs  Lab 09/24/23 2348  CKTOTAL 122   No results for input(s): "PROBNP" in the last 8760 hours. Coagulation Profile: Recent Labs  Lab 09/24/23 2348  INR 9.5*   Thyroid Function Tests: No results for input(s): "TSH", "  T4TOTAL", "FREET4", "T3FREE", "THYROIDAB" in the last 72 hours. Lipid Profile: No results for input(s): "CHOL", "HDL", "LDLCALC", "TRIG", "CHOLHDL", "LDLDIRECT" in the last 72 hours. Anemia Panel: No results for input(s): "VITAMINB12", "FOLATE", "FERRITIN", "TIBC", "IRON", "RETICCTPCT" in the last 72 hours. Urine analysis:    Component Value Date/Time   COLORURINE YELLOW 09/24/2023 1735   APPEARANCEUR HAZY (A) 09/24/2023 1735   LABSPEC 1.012 09/24/2023 1735   PHURINE 5.0 09/24/2023 1735   GLUCOSEU NEGATIVE 09/24/2023 1735   GLUCOSEU NEGATIVE 09/09/2009 1651   HGBUR LARGE (A) 09/24/2023 1735   BILIRUBINUR NEGATIVE 09/24/2023 1735   KETONESUR NEGATIVE 09/24/2023 1735   PROTEINUR 100 (A) 09/24/2023 1735   UROBILINOGEN 0.2 09/09/2009 1651   NITRITE NEGATIVE 09/24/2023 1735   LEUKOCYTESUR NEGATIVE 09/24/2023 1735   Sepsis Labs: Invalid input(s): "PROCALCITONIN", "LACTICIDVEN"  Microbiology: Recent Results (from the past 240 hours)  MRSA Next Gen by PCR, Nasal     Status: None   Collection Time: 09/24/23  4:42 PM   Specimen:  Nasal Mucosa; Nasal Swab  Result Value Ref Range Status   MRSA by PCR Next Gen NOT DETECTED NOT DETECTED Final    Comment: (NOTE) The GeneXpert MRSA Assay (FDA approved for NASAL specimens only), is one component of a comprehensive MRSA colonization surveillance program. It is not intended to diagnose MRSA infection nor to guide or monitor treatment for MRSA infections. Test performance is not FDA approved in patients less than 70 years old. Performed at [facility], [address] [place], [place] [address]     Radiology Studies: ECHOCARDIOGRAM COMPLETE Result Date: 09/25/2023    ECHOCARDIOGRAM REPORT   Patient Name:   Joshua [name] Date of Exam: 09/25/2023 Medical Rec #:  [id number]      Height:       68.0 in Accession #:    [id number]     Weight:       244.7 lb Date of Birth:  30-Dec-1950      BSA:          2.227 m Patient Age:    72 years       BP:           122/39 mmHg Patient Gender: M              HR:           63 bpm. Exam Location:  Inpatient Procedure: 2D Echo, Cardiac Doppler and Color Doppler Indications:    Afib I48.91  History:        Patient has no prior history of Echocardiogram examinations.  Sonographer:    Harriette Bouillon RDCS Referring Phys: [phone] DAVID MANUEL ORTIZ IMPRESSIONS  1. Left ventricular ejection fraction, by estimation, is 55 to 60%. The left ventricle has normal function. The left ventricle has no regional wall motion abnormalities. Left ventricular diastolic parameters were normal.  2. Right ventricular systolic function is mildly reduced. The right ventricular size is normal.  3. Left atrial size was severely dilated.  4. The mitral valve is abnormal. No evidence of mitral valve regurgitation. No evidence of mitral stenosis.  5. The aortic valve is tricuspid. There is mild calcification of the aortic valve. There is mild thickening of the aortic valve. Aortic valve regurgitation is not visualized. Aortic valve sclerosis is present, with no  evidence of aortic valve stenosis.  6. The inferior vena cava is dilated in size with <50% respiratory variability, suggesting right atrial pressure of 15 mmHg. FINDINGS  Left Ventricle: Left  ventricular ejection fraction, by estimation, is 55 to 60%. The left ventricle has normal function. The left ventricle has no regional wall motion abnormalities. The left ventricular internal cavity size was normal in size. There is  no left ventricular hypertrophy. Left ventricular diastolic parameters were normal. Right Ventricle: The right ventricular size is normal. No increase in right ventricular wall thickness. Right ventricular systolic function is mildly reduced. Left Atrium: Left atrial size was severely dilated. Right Atrium: Right atrial size was normal in size. Pericardium: Trivial pericardial effusion is present. The pericardial effusion is posterior to the left ventricle. Mitral Valve: The mitral valve is abnormal. There is mild thickening of the mitral valve leaflet(s). There is mild calcification of the mitral valve leaflet(s). Mild mitral annular calcification. No evidence of mitral valve regurgitation. No evidence of mitral valve stenosis. Tricuspid Valve: The tricuspid valve is normal in structure. Tricuspid valve regurgitation is mild . No evidence of tricuspid stenosis. Aortic Valve: The aortic valve is tricuspid. There is mild calcification of the aortic valve. There is mild thickening of the aortic valve. Aortic valve regurgitation is not visualized. Aortic valve sclerosis is present, with no evidence of aortic valve stenosis. Pulmonic Valve: The pulmonic valve was normal in structure. Pulmonic valve regurgitation is trivial. No evidence of pulmonic stenosis. Aorta: The aortic root, ascending aorta and aortic arch are all structurally normal, with no evidence of dilitation or obstruction. Venous: The inferior vena cava is dilated in size with less than 50% respiratory variability, suggesting right atrial  pressure of 15 mmHg. IAS/Shunts: No atrial level shunt detected by color flow Doppler.  LEFT VENTRICLE PLAX 2D LVIDd:         5.00 cm   Diastology LVIDs:         2.40 cm   LV e' medial:    5.55 cm/s LV PW:         1.10 cm   LV E/e' medial:  14.9 LV IVS:        1.10 cm   LV e' lateral:   7.94 cm/s LVOT diam:     2.00 cm   LV E/e' lateral: 10.4 LV SV:         95 LV SV Index:   43 LVOT Area:     3.14 cm  RIGHT VENTRICLE         IVC TAPSE (M-mode): 2.2 cm  IVC diam: 2.90 cm LEFT ATRIUM              Index        RIGHT ATRIUM           Index LA diam:        5.20 cm  2.34 cm/m   RA Area:     19.10 cm LA Vol (A2C):   108.0 ml 48.50 ml/m  RA Volume:   51.40 ml  23.08 ml/m LA Vol (A4C):   87.2 ml  39.16 ml/m LA Biplane Vol: 105.0 ml 47.15 ml/m  AORTIC VALVE LVOT Vmax:   143.00 cm/s LVOT Vmean:  106.000 cm/s LVOT VTI:    0.303 m  AORTA Ao Root diam: 3.10 cm Ao Asc diam:  3.70 cm MITRAL VALVE               TRICUSPID VALVE MV Area (PHT): 2.95 cm    TR Peak grad:   33.6 mmHg MV Decel Time: 257 msec    TR Vmax:        290.00 cm/s MV E velocity: 82.70  cm/s MV A velocity: 60.40 cm/s  SHUNTS MV E/A ratio:  1.37        Systemic VTI:  0.30 m                            Systemic Diam: 2.00 cm Charlton Haws MD Electronically signed by Charlton Haws MD Signature Date/Time: 09/25/2023/10:34:20 AM    Final       Porshea Janowski T. Zebediah Beezley [facility]  If 7PM-7AM, please contact night-coverage [email or web address] 09/25/2023, 12:17 PM

## 2023-09-25 NOTE — Progress Notes (Addendum)
     Patient Name: Joshua [name]           DOB: [birth year]  MRN: [id number]      Admission Date: 09/24/2023  Attending Provider: Almon Hercules, MD  Primary Diagnosis: Gastrointestinal hemorrhage with melena   Level of care: Stepdown   OVERNIGHT PROGRESS REPORT   Late notification of critical INR- 9.5 this morning. This lab resulted earlier at midnight.  RN reports scant Penile bleeding. Hgb 7.3.  Order placed for IV Vitamin K.     Anthoney Harada, DNP, ACNPC- AG [facility]

## 2023-09-26 ENCOUNTER — Encounter (HOSPITAL_COMMUNITY)
Admission: EM | Disposition: A | Discharge status: Rehabilitation Facility | Payer: Self-pay | Source: Ambulatory Visit | Attending: Student

## 2023-09-26 ENCOUNTER — Encounter (HOSPITAL_COMMUNITY): Payer: Self-pay | Admitting: Anesthesiology

## 2023-09-26 DIAGNOSIS — Z7901 Long term (current) use of anticoagulants: Secondary | ICD-10-CM

## 2023-09-26 DIAGNOSIS — K921 Melena: Secondary | ICD-10-CM | POA: Diagnosis not present

## 2023-09-26 DIAGNOSIS — D62 Acute posthemorrhagic anemia: Secondary | ICD-10-CM | POA: Diagnosis not present

## 2023-09-26 LAB — CBC
HCT: 22.4 % — ABNORMAL LOW (ref 39.0–52.0)
Hemoglobin: 7 g/dL — ABNORMAL LOW (ref 13.0–17.0)
MCH: 30.8 pg (ref 26.0–34.0)
MCHC: 31.3 g/dL (ref 30.0–36.0)
MCV: 98.7 fL (ref 80.0–100.0)
Platelets: 154 10*3/uL (ref 150–400)
RBC: 2.27 MIL/uL — ABNORMAL LOW (ref 4.22–5.81)
RDW: 15.3 % (ref 11.5–15.5)
WBC: 4.5 10*3/uL (ref 4.0–10.5)
nRBC: 0 % (ref 0.0–0.2)

## 2023-09-26 LAB — RENAL FUNCTION PANEL
Albumin: 2.2 g/dL — ABNORMAL LOW (ref 3.5–5.0)
Anion gap: 11 (ref 5–15)
BUN: 104 mg/dL — ABNORMAL HIGH (ref 8–23)
CO2: 18 mmol/L — ABNORMAL LOW (ref 22–32)
Calcium: 8.2 mg/dL — ABNORMAL LOW (ref 8.9–10.3)
Chloride: 106 mmol/L (ref 98–111)
Creatinine, Ser: 3.35 mg/dL — ABNORMAL HIGH (ref 0.61–1.24)
GFR, Estimated: 19 mL/min — ABNORMAL LOW (ref >60)
Glucose, Bld: 92 mg/dL (ref 70–99)
Phosphorus: 4.2 mg/dL (ref 2.5–4.6)
Potassium: 4.1 mmol/L (ref 3.5–5.1)
Sodium: 135 mmol/L (ref 135–145)

## 2023-09-26 LAB — HEMOGLOBIN AND HEMATOCRIT, BLOOD
HCT: 24.5 % — ABNORMAL LOW (ref 39.0–52.0)
HCT: 28.4 % — ABNORMAL LOW (ref 39.0–52.0)
Hemoglobin: 8 g/dL — ABNORMAL LOW (ref 13.0–17.0)
Hemoglobin: 9.5 g/dL — ABNORMAL LOW (ref 13.0–17.0)

## 2023-09-26 LAB — GLUCOSE, CAPILLARY
Glucose-Capillary: 87 mg/dL (ref 70–99)
Glucose-Capillary: 93 mg/dL (ref 70–99)
Glucose-Capillary: 101 mg/dL — ABNORMAL HIGH (ref 70–99)

## 2023-09-26 LAB — PREPARE RBC (CROSSMATCH)

## 2023-09-26 LAB — PROTIME-INR
INR: 3.2 — ABNORMAL HIGH (ref 0.8–1.2)
Prothrombin Time: 33 s — ABNORMAL HIGH (ref 11.4–15.2)

## 2023-09-26 LAB — APTT: aPTT: 89 s — ABNORMAL HIGH (ref 24–36)

## 2023-09-26 LAB — MAGNESIUM: Magnesium: 2.5 mg/dL — ABNORMAL HIGH (ref 1.7–2.4)

## 2023-09-26 SURGERY — CANCELLED PROCEDURE

## 2023-09-26 MED ORDER — SODIUM CHLORIDE 0.9% IV SOLUTION: Freq: Once | INTRAVENOUS | Status: AC

## 2023-09-26 MED ORDER — SODIUM CHLORIDE 0.45 % IV SOLN
INTRAVENOUS | Status: AC
  Filled 2023-09-26 (×5): qty 75

## 2023-09-26 SURGICAL SUPPLY — 12 items
BLOCK BITE 60FR ADLT L/F BLUE (MISCELLANEOUS) ×2
ELECT REM PT RETURN 9FT ADLT (ELECTROSURGICAL)
FORCEP RJ3 GP 1.8X160 W-NEEDLE (CUTTING FORCEPS)
FORCEPS BIOP RAD 4 LRG CAP 4 (CUTTING FORCEPS)
NEEDLE SCLEROTHERAPY 25GX240 (NEEDLE)
PROBE APC STR FIRE (PROBE)
PROBE INJECTION GOLD 7FR (MISCELLANEOUS)
SNARE SHORT THROW 13M SML OVAL (MISCELLANEOUS)
SYR 50ML LL SCALE MARK (SYRINGE)
TUBING ENDO SMARTCAP PENTAX (MISCELLANEOUS) ×4
TUBING IRRIGATION ENDOGATOR (MISCELLANEOUS) ×2
WATER STERILE IRR 1000ML POUR (IV SOLUTION)

## 2023-09-26 NOTE — Progress Notes (Signed)
PROGRESS NOTE  Joshua [name] BMW:[id number] DOB: 1951-02-07   PCP: [name], MD  Patient is from: Home.  Lives with his wife.  Ambulates holding to furniture and walls at baseline  DOA: 09/24/2023 LOS: 2  Chief complaints Chief Complaint  Patient presents with   Weakness   Dizziness   Shortness of Breath     Brief Narrative / Interim history: 72 year old M with PMH of PAF/flutter on Pradaxa, CAD s/p stents in 2009, DM-2, CKD-3A, HTN, HLD and osteoarthritis presenting with progressive generalized weakness, dizziness, dyspnea, melena and edema for weeks, and admitted with symptomatic anemia in the setting of GI bleed, AKI with uremia and hyponatremia.  In ED, vitals stable. Na 128. Cr 4.45 (1.4 on 8/13).  BUN 126.  Bicarb 15.  AG 12.  Hgb 7.0.  Platelets 148.  UA with microscopic hematuria.  One units of blood ordered.  Started on IV Protonix.  GI consulted.  EGD postponed due to elevated INR and low hemoglobin for the second time.  Received Praxibind on 12/28.  Transfusing FFP and additional unit of PRBC.  AKI improving with IV fluid.  TTE and renal ultrasound without significant finding.  Subjective: Seen and examined earlier this morning.  No major events overnight of this morning.  No further hematuria or epistaxis.  Reportedly some oozing from IV sites after blood draw.  Otherwise, patient has no complaints.  Patient's wife and daughter at bedside.  Objective: Vitals:   09/26/23 0800 09/26/23 0920 09/26/23 0955 09/26/23 1000  BP: (!) 137/56 120/70 (!) 162/62 (!) 167/68  Pulse: 65 63 64 64  Resp: (!) 22 (!) 21 (!) 23   Temp: (!) 97.4 F (36.3 C) 97.8 F (36.6 C) 98.7 F (37.1 C)   TempSrc: Oral Oral Oral   SpO2: 100% 99% 93% 93%  Weight:      Height:        Examination:  GENERAL: No apparent distress.  Nontoxic. HEENT: MMM.  Vision and hearing grossly intact.  NECK: Supple.  No apparent JVD.  RESP:  No IWOB.  Fair aeration bilaterally. CVS:  RRR. Heart  sounds normal.  ABD/GI/GU: BS+. Abd soft, NTND.  MSK/EXT:  Moves extremities. No apparent deformity. No edema.  SKIN: no apparent skin lesion or wound NEURO: Awake, alert and oriented appropriately.  No apparent focal neuro deficit. PSYCH: Calm. Normal affect.   Procedures:  None  Microbiology summarized: MRSA PCR screen nonreactive  Assessment and plan: Symptomatic acute blood loss anemia due to upper GI bleed: Patient is on Pradaxa for A-fib/flutter.  Hgb 7.0 (was 13.1 on 6/20).  Had melena for over 2 weeks.  Symptomatic.  Transfused 1 unit on 12/27 without significant improvement partly due to hemodilution from IV fluid.  S/p Praxibind on 12/28. Ordered additional 1 unit on 12/28 that he has not received yet.  Anemia panel without significant nutritional deficiency. Recent Labs    03/18/23 1026 09/24/23 1227 09/24/23 2348 09/25/23 0317 09/25/23 1757 09/26/23 0242 09/26/23 0915  HGB 13.1 7.0* 7.5* 7.3* 7.8* 7.0* 8.0*  -Monitor H&H.  Goal Hgb > 8.0 given history of CAD.  Transfuse additional 1 unit -Transfuse 1 unit of FFP -Continue IV Protonix -Continue holding Pradaxa. -On full liquid diet per GI.  N.p.o. after midnight.  AKI on CKD-3A/uremia: ATN from anemia?  Takes Benicar at home.  Denies NSAID use.  UA with hematuria.  Renal US without significant finding.  Improving. Recent Labs    03/18/23 1026 05/11/23 1007 09/24/23 1227  09/25/23 0317 09/26/23 0242  BUN 29* 24 126* 118* 104*  CREATININE 1.34* 1.41* 4.45* 3.98* 3.35*  -Continue IV sodium bicarbonate -Avoid nephrotoxic meds -Manage anemia as above -Continue monitoring -Strict intake and output with intermittent bladder scan     DM-2 with CKD-3A: Last A1c 6.0% in 2019.  CBG within normal range. Recent Labs  Lab 09/24/23 2108 09/25/23 0800 09/25/23 1154 09/25/23 1646 09/26/23 0832  GLUCAP 102* 93 97 102* 87  -Discontinue CBG and SSI -Hold home meds. -Follow hemoglobin A1c   Essential hypertension:  Soft BP with wide pulse pressure.  TTE without significant finding. -Hold home losartan, Imdur and Coreg   History of CAD s/p stent in 2009: No anginal symptoms. -Hold cardiac meds for now   Paroxysmal atrial fibrillation (HCC) -Continue holding Pradaxa -Holding Coreg due to soft BP -Optimize electrolytes  Non-anion gap metabolic acidosis: Likely due to renal failure.  Improved -Continue IV sodium bicarbonate  Hyponatremia: Likely due to renal failure.  Resolved. -Continue monitoring  Epistaxis: Resolved. -S/p Praxibind on 12/28 -Advised patient not to pick his nose.  -FFP   Thrombocytopenia: Resolved. -Monitor  Physical deconditioning -PT/OT  Hyperlipidemia -Continue rosuvastatin   Elevated INR: 9.5 on 12/28.  Unclear significance of this while patient is on Pradaxa.  Improved to 3.2 after vitamin K and Pradaxa reversal.  -1 unit of FFP -Check APTT  Morbid obesity Body mass index is 37.21 kg/m.          DVT prophylaxis:  SCDs Start: 09/24/23 1503  Code Status: Full code Family Communication: Dated patient's wife and daughter at bedside Level of care: Stepdown Status is: Inpatient Remains inpatient appropriate because: Symptomatic acute blood loss anemia and AKI with uremia   Final disposition: Likely home once medically stable Consultants:  Gastroenterology  55 minutes with more than 50% spent in reviewing records, counseling patient/family and coordinating care.   Sch Meds:  Scheduled Meds:  sodium chloride   Intravenous Once   Chlorhexidine Gluconate Cloth  6 each Topical Daily   cyanocobalamin  500 mcg Oral Daily   insulin aspart  0-6 Units Subcutaneous TID WC   midodrine  5 mg Oral TID WC   pantoprazole (PROTONIX) IV  40 mg Intravenous Q12H   Continuous Infusions:   PRN Meds:.acetaminophen **OR** acetaminophen, ondansetron **OR** ondansetron (ZOFRAN) IV, mouth rinse  Antimicrobials: Anti-infectives (From admission, onward)    None         I have personally reviewed the following labs and images: CBC: Recent Labs  Lab 09/24/23 1227 09/24/23 2348 09/25/23 0317 09/25/23 1757 09/26/23 0242 09/26/23 0915  WBC 4.6  --  4.1  --  4.5  --   HGB 7.0* 7.5* 7.3* 7.8* 7.0* 8.0*  HCT 20.8* 22.5* 22.2* 24.1* 22.4* 24.5*  MCV 95.0  --  96.1  --  98.7  --   PLT 148*  --  130*  --  154  --    BMP &GFR Recent Labs  Lab 09/24/23 1227 09/25/23 0317 09/26/23 0242  NA 128* 131* 135  K 4.6 4.3 4.1  CL 101 105 106  CO2 15* 16* 18*  GLUCOSE 127* 86 92  BUN 126* 118* 104*  CREATININE 4.45* 3.98* 3.35*  CALCIUM 8.6* 8.4* 8.2*  MG  --   --  2.5*  PHOS  --   --  4.2   Estimated Creatinine Clearance: 23.4 mL/min (A) (by C-G formula based on SCr of 3.35 mg/dL (H)). Liver & Pancreas: Recent Labs  Lab 09/24/23  1227 09/25/23 0317 09/26/23 0242  AST 23 20  --   ALT 12 11  --   ALKPHOS 30* 27*  --   BILITOT 1.0 1.0  --   PROT 5.7* 5.2*  --   ALBUMIN 2.3* 2.1* 2.2*   No results for input(s): "LIPASE", "AMYLASE" in the last 168 hours. No results for input(s): "AMMONIA" in the last 168 hours. Diabetic: No results for input(s): "HGBA1C" in the last 72 hours. Recent Labs  Lab 09/24/23 2108 09/25/23 0800 09/25/23 1154 09/25/23 1646 09/26/23 0832  GLUCAP 102* 93 97 102* 87   Cardiac Enzymes: Recent Labs  Lab 09/24/23 2348  CKTOTAL 122   No results for input(s): "PROBNP" in the last 8760 hours. Coagulation Profile: Recent Labs  Lab 09/24/23 2348 09/25/23 1458 09/26/23 0242  INR 9.5* 2.5* 3.2*   Thyroid Function Tests: No results for input(s): "TSH", "T4TOTAL", "FREET4", "T3FREE", "THYROIDAB" in the last 72 hours. Lipid Profile: No results for input(s): "CHOL", "HDL", "LDLCALC", "TRIG", "CHOLHDL", "LDLDIRECT" in the last 72 hours. Anemia Panel: Recent Labs    09/25/23 0317 09/25/23 1757  VITAMINB12  --  2,137*  FOLATE  --  5.6*  FERRITIN  --  470*  TIBC  --  283  IRON  --  47  RETICCTPCT 2.8  --     Urine analysis:    Component Value Date/Time   COLORURINE YELLOW 09/24/2023 1735   APPEARANCEUR HAZY (A) 09/24/2023 1735   LABSPEC 1.012 09/24/2023 1735   PHURINE 5.0 09/24/2023 1735   GLUCOSEU NEGATIVE 09/24/2023 1735   GLUCOSEU NEGATIVE 09/09/2009 1651   HGBUR LARGE (A) 09/24/2023 1735   BILIRUBINUR NEGATIVE 09/24/2023 1735   KETONESUR NEGATIVE 09/24/2023 1735   PROTEINUR 100 (A) 09/24/2023 1735   UROBILINOGEN 0.2 09/09/2009 1651   NITRITE NEGATIVE 09/24/2023 1735   LEUKOCYTESUR NEGATIVE 09/24/2023 1735   Sepsis Labs: Invalid input(s): "PROCALCITONIN", "LACTICIDVEN"  Microbiology: Recent Results (from the past 240 hours)  MRSA Next Gen by PCR, Nasal     Status: None   Collection Time: 09/24/23  4:42 PM   Specimen: Nasal Mucosa; Nasal Swab  Result Value Ref Range Status   MRSA by PCR Next Gen NOT DETECTED NOT DETECTED Final    Comment: (NOTE) The GeneXpert MRSA Assay (FDA approved for NASAL specimens only), is one component of a comprehensive MRSA colonization surveillance program. It is not intended to diagnose MRSA infection nor to guide or monitor treatment for MRSA infections. Test performance is not FDA approved in patients less than 85 years old. Performed at [facility], [address] [place], [place] [address]     Radiology Studies: US RENAL Result Date: 09/25/2023 CLINICAL DATA:  Acute renal injury EXAM: RENAL / URINARY TRACT ULTRASOUND COMPLETE COMPARISON:  06/17/2022 FINDINGS: Right Kidney: Renal measurements: 10.6 x 5.2 x 5.3 cm. = volume: 152 mL. Echogenicity within normal limits. No mass or hydronephrosis visualized. Left Kidney: Renal measurements: 12.1 x 5.5 x 5.3 cm. = volume: 187 mL. A 9.8 cm cyst is noted arising from the lower pole of the left kidney. This is similar to that seen on prior ultrasound examination. A smaller mid polar 2.1 cm cyst is noted as well. No further follow-up is necessary. Bladder: Decompressed Other: None.  IMPRESSION: Left renal cysts which are simple in appearance and stable from previous exams. No follow-up is recommended. No other focal abnormality is noted. Electronically Signed   By: Alcide Clever M.D.   On: 09/25/2023 18:21  Whitney Bingaman T. Glennon Kopko [facility]  If 7PM-7AM, please contact night-coverage [email or web address] 09/26/2023, 11:11 AM

## 2023-09-26 NOTE — Plan of Care (Signed)
°  Problem: Clinical Measurements: Goal: Cardiovascular complication will be avoided Outcome: Progressing   Problem: Activity: Goal: Risk for activity intolerance will decrease Outcome: Progressing   Problem: Nutrition: Goal: Adequate nutrition will be maintained Outcome: Progressing   Problem: Coping: Goal: Level of anxiety will decrease Outcome: Progressing   Problem: Pain Management: Goal: General experience of comfort will improve Outcome: Progressing   Problem: Safety: Goal: Ability to remain free from injury will improve Outcome: Progressing   Problem: Skin Integrity: Goal: Risk for impaired skin integrity will decrease Outcome: Progressing   Problem: Tissue Perfusion: Goal: Adequacy of tissue perfusion will improve Outcome: Progressing

## 2023-09-26 NOTE — Progress Notes (Addendum)
Patient ID: Joshua [name], male   DOB: Feb 12, 1951, 72 y.o.   MRN: [id number]    Progress Note   Subjective  Day # 2 CC: Melena, anemia  Patient has not had any bowel movement since admission, no complaints of abdominal pain, no nausea or vomiting, tired of getting stuck. Still having some oozing from IV sites and blood draw sites  Labs today-PT 33/INR 3.2 post vitamin K and Praxbind Hemoglobin 7  To be transfused this morning EGD canceled secondary anemia, oozing and INR above 3   Objective   Vital signs in last 24 hours: Temp:  [97.5 F (36.4 C)-98.2 F (36.8 C)] 97.7 F (36.5 C) (12/29 0403) Pulse Rate:  [54-95] 65 (12/29 0800) Resp:  [18-30] 22 (12/29 0800) BP: (106-152)/(33-109) 137/56 (12/29 0800) SpO2:  [90 %-100 %] 100 % (12/29 0800) Last BM Date : 09/24/23 General:    Elderly white male in NAD family at bedside, pale but color better than yesterday Heart:  Regular rate and rhythm; no murmurs Lungs: Respirations even and unlabored, lungs CTA bilaterally Abdomen:  Soft, obese, nontender and nondistended. Normal bowel sounds. Extremities:  Without edema. Neurologic:  Alert and oriented,  grossly normal neurologically. Psych:  Cooperative. Normal mood and affect.  Intake/Output from previous day: 12/28 0701 - 12/29 0700 In: 2476.3 [I.V.:2476.3] Out: 1085 [Urine:1085] Intake/Output this shift: Total I/O In: 125 [I.V.:125] Out: -   Lab Results: Recent Labs    09/24/23 1227 09/24/23 2348 09/25/23 0317 09/25/23 1757 09/26/23 0242  WBC 4.6  --  4.1  --  4.5  HGB 7.0*   < > 7.3* 7.8* 7.0*  HCT 20.8*   < > 22.2* 24.1* 22.4*  PLT 148*  --  130*  --  154   < > = values in this interval not displayed.   BMET Recent Labs    09/24/23 1227 09/25/23 0317 09/26/23 0242  NA 128* 131* 135  K 4.6 4.3 4.1  CL 101 105 106  CO2 15* 16* 18*  GLUCOSE 127* 86 92  BUN 126* 118* 104*  CREATININE 4.45* 3.98* 3.35*  CALCIUM 8.6* 8.4* 8.2*   LFT Recent Labs     09/24/23 1227 09/25/23 0317 09/26/23 0242  PROT 5.7* 5.2*  --   ALBUMIN 2.3* 2.1* 2.2*  AST 23 20  --   ALT 12 11  --   ALKPHOS 30* 27*  --   BILITOT 1.0 1.0  --   BILIDIR 0.4*  --   --   IBILI 0.6  --   --    PT/INR Recent Labs    09/25/23 1458 09/26/23 0242  LABPROT 26.9* 33.0*  INR 2.5* 3.2*    Studies/Results: US RENAL Result Date: 09/25/2023 CLINICAL DATA:  Acute renal injury EXAM: RENAL / URINARY TRACT ULTRASOUND COMPLETE COMPARISON:  06/17/2022 FINDINGS: Right Kidney: Renal measurements: 10.6 x 5.2 x 5.3 cm. = volume: 152 mL. Echogenicity within normal limits. No mass or hydronephrosis visualized. Left Kidney: Renal measurements: 12.1 x 5.5 x 5.3 cm. = volume: 187 mL. A 9.8 cm cyst is noted arising from the lower pole of the left kidney. This is similar to that seen on prior ultrasound examination. A smaller mid polar 2.1 cm cyst is noted as well. No further follow-up is necessary. Bladder: Decompressed Other: None. IMPRESSION: Left renal cysts which are simple in appearance and stable from previous exams. No follow-up is recommended. No other focal abnormality is noted. Electronically Signed   By: Eulah Pont.D.  On: 09/25/2023 18:21   ECHOCARDIOGRAM COMPLETE Result Date: 09/25/2023    ECHOCARDIOGRAM REPORT   Patient Name:   Joshua [name] Date of Exam: 09/25/2023 Medical Rec #:  [id number]      Height:       68.0 in Accession #:    [id number]     Weight:       244.7 lb Date of Birth:  08-07-1951      BSA:          2.227 m Patient Age:    72 years       BP:           122/39 mmHg Patient Gender: M              HR:           63 bpm. Exam Location:  Inpatient Procedure: 2D Echo, Cardiac Doppler and Color Doppler Indications:    Afib I48.91  History:        Patient has no prior history of Echocardiogram examinations.  Sonographer:    Harriette Bouillon RDCS Referring Phys: [phone] DAVID MANUEL ORTIZ IMPRESSIONS  1. Left ventricular ejection fraction, by estimation, is 55 to 60%. The  left ventricle has normal function. The left ventricle has no regional wall motion abnormalities. Left ventricular diastolic parameters were normal.  2. Right ventricular systolic function is mildly reduced. The right ventricular size is normal.  3. Left atrial size was severely dilated.  4. The mitral valve is abnormal. No evidence of mitral valve regurgitation. No evidence of mitral stenosis.  5. The aortic valve is tricuspid. There is mild calcification of the aortic valve. There is mild thickening of the aortic valve. Aortic valve regurgitation is not visualized. Aortic valve sclerosis is present, with no evidence of aortic valve stenosis.  6. The inferior vena cava is dilated in size with <50% respiratory variability, suggesting right atrial pressure of 15 mmHg. FINDINGS  Left Ventricle: Left ventricular ejection fraction, by estimation, is 55 to 60%. The left ventricle has normal function. The left ventricle has no regional wall motion abnormalities. The left ventricular internal cavity size was normal in size. There is  no left ventricular hypertrophy. Left ventricular diastolic parameters were normal. Right Ventricle: The right ventricular size is normal. No increase in right ventricular wall thickness. Right ventricular systolic function is mildly reduced. Left Atrium: Left atrial size was severely dilated. Right Atrium: Right atrial size was normal in size. Pericardium: Trivial pericardial effusion is present. The pericardial effusion is posterior to the left ventricle. Mitral Valve: The mitral valve is abnormal. There is mild thickening of the mitral valve leaflet(s). There is mild calcification of the mitral valve leaflet(s). Mild mitral annular calcification. No evidence of mitral valve regurgitation. No evidence of mitral valve stenosis. Tricuspid Valve: The tricuspid valve is normal in structure. Tricuspid valve regurgitation is mild . No evidence of tricuspid stenosis. Aortic Valve: The aortic valve  is tricuspid. There is mild calcification of the aortic valve. There is mild thickening of the aortic valve. Aortic valve regurgitation is not visualized. Aortic valve sclerosis is present, with no evidence of aortic valve stenosis. Pulmonic Valve: The pulmonic valve was normal in structure. Pulmonic valve regurgitation is trivial. No evidence of pulmonic stenosis. Aorta: The aortic root, ascending aorta and aortic arch are all structurally normal, with no evidence of dilitation or obstruction. Venous: The inferior vena cava is dilated in size with less than 50% respiratory variability, suggesting right atrial pressure of 15 mmHg.  IAS/Shunts: No atrial level shunt detected by color flow Doppler.  LEFT VENTRICLE PLAX 2D LVIDd:         5.00 cm   Diastology LVIDs:         2.40 cm   LV e' medial:    5.55 cm/s LV PW:         1.10 cm   LV E/e' medial:  14.9 LV IVS:        1.10 cm   LV e' lateral:   7.94 cm/s LVOT diam:     2.00 cm   LV E/e' lateral: 10.4 LV SV:         95 LV SV Index:   43 LVOT Area:     3.14 cm  RIGHT VENTRICLE         IVC TAPSE (M-mode): 2.2 cm  IVC diam: 2.90 cm LEFT ATRIUM              Index        RIGHT ATRIUM           Index LA diam:        5.20 cm  2.34 cm/m   RA Area:     19.10 cm LA Vol (A2C):   108.0 ml 48.50 ml/m  RA Volume:   51.40 ml  23.08 ml/m LA Vol (A4C):   87.2 ml  39.16 ml/m LA Biplane Vol: 105.0 ml 47.15 ml/m  AORTIC VALVE LVOT Vmax:   143.00 cm/s LVOT Vmean:  106.000 cm/s LVOT VTI:    0.303 m  AORTA Ao Root diam: 3.10 cm Ao Asc diam:  3.70 cm MITRAL VALVE               TRICUSPID VALVE MV Area (PHT): 2.95 cm    TR Peak grad:   33.6 mmHg MV Decel Time: 257 msec    TR Vmax:        290.00 cm/s MV E velocity: 82.70 cm/s MV A velocity: 60.40 cm/s  SHUNTS MV E/A ratio:  1.37        Systemic VTI:  0.30 m                            Systemic Diam: 2.00 cm Charlton Haws MD Electronically signed by Charlton Haws MD Signature Date/Time: 09/25/2023/10:34:20 AM    Final        Assessment  / Plan:    83. 72 year old white male with history of coronary artery disease status post remote stents on chronic Pradaxa, admitted through the ER with progressive weakness, dizziness shortness of breath and also had noted dark stool over the prior 2 weeks  Hgb  7 on admit down from baseline of 13  No prior history of GI bleeding Etiology of bleeding is not clear, rule out erosive gastropathy, peptic ulcer disease, AVMs, esophagitis or spontaneous oozing in setting of INR greater than 9.5  2. Marked coagulopathy secondary to Pradaxa-received vitamin K and Praxbind yesterday-INR still above 3 today  3.  Anemia acute secondary to acute/subacute GI blood loss 4.  Chronic kidney disease 5.  Hypertension 6.  Diabetes mellitus 7.  History of atrial fib flutter  Plan;  EGD canceled this a.m. Start full liquids, n.p.o. after midnight Continue IV PPI twice daily Transfuse 1 to 2 units today and keep hemoglobin above 8 Will defer to medicine service, will need further correction of coagulopathy probably with further doses of vitamin K today Would like  INR 2 or less for EGD Will tentatively reschedule for EGD with Dr. Chales Abrahams early tomorrow afternoon and results of a.m. labs Plans were discussed with the patient and family at bedside this morning   Principal Problem:   Gastrointestinal hemorrhage with melena Active Problems:   Type 2 diabetes mellitus (HCC)   Hyperlipidemia   Essential hypertension   Coronary artery disease due to lipid rich plaque   Paroxysmal atrial fibrillation (HCC)   Pulmonary hypertension (HCC)   Symptomatic anemia   ABLA (acute blood loss anemia)   Protein-calorie malnutrition, severe (HCC)   Thrombocytosis   Hyponatremia   AKI (acute kidney injury) (HCC)   CKD stage 3a, GFR 45-59 ml/min (HCC)    LOS: 2 days   Amy Esterwood PA-C 09/26/2023, 8:36 AM   Attending Physician Note   I have taken an interval history, reviewed the chart and examined the patient  with Ms. Monica Becton. I performed a substantive portion of this encounter, including complete performance of at least one of the key components, in conjunction with the APP. I agree with the APP's note, impression and recommendations with my edits.    Claudette Head, MD [facility] See AMION,  GI, for our on call provider

## 2023-09-27 ENCOUNTER — Inpatient Hospital Stay (HOSPITAL_COMMUNITY): Payer: Medicare HMO

## 2023-09-27 ENCOUNTER — Other Ambulatory Visit (HOSPITAL_COMMUNITY): Payer: Self-pay

## 2023-09-27 ENCOUNTER — Encounter (HOSPITAL_COMMUNITY): Payer: Self-pay | Admitting: Pharmacy Technician

## 2023-09-27 DIAGNOSIS — D649 Anemia, unspecified: Secondary | ICD-10-CM | POA: Diagnosis not present

## 2023-09-27 DIAGNOSIS — R310 Gross hematuria: Secondary | ICD-10-CM

## 2023-09-27 DIAGNOSIS — K921 Melena: Secondary | ICD-10-CM | POA: Diagnosis not present

## 2023-09-27 DIAGNOSIS — J9601 Acute respiratory failure with hypoxia: Secondary | ICD-10-CM | POA: Diagnosis not present

## 2023-09-27 LAB — MAGNESIUM: Magnesium: 2.3 mg/dL (ref 1.7–2.4)

## 2023-09-27 LAB — RENAL FUNCTION PANEL
Albumin: 2.3 g/dL — ABNORMAL LOW (ref 3.5–5.0)
Anion gap: 8 (ref 5–15)
BUN: 79 mg/dL — ABNORMAL HIGH (ref 8–23)
CO2: 23 mmol/L (ref 22–32)
Calcium: 8.1 mg/dL — ABNORMAL LOW (ref 8.9–10.3)
Chloride: 108 mmol/L (ref 98–111)
Creatinine, Ser: 2.6 mg/dL — ABNORMAL HIGH (ref 0.61–1.24)
GFR, Estimated: 25 mL/min — ABNORMAL LOW (ref >60)
Glucose, Bld: 99 mg/dL (ref 70–99)
Phosphorus: 3.3 mg/dL (ref 2.5–4.6)
Potassium: 4.1 mmol/L (ref 3.5–5.1)
Sodium: 139 mmol/L (ref 135–145)

## 2023-09-27 LAB — PREPARE FRESH FROZEN PLASMA: Unit division: 0

## 2023-09-27 LAB — TYPE AND SCREEN
ABO/RH(D): A POS
Antibody Screen: NEGATIVE
Unit division: 0
Unit division: 0

## 2023-09-27 LAB — CBC
HCT: 27.4 % — ABNORMAL LOW (ref 39.0–52.0)
HCT: 28.8 % — ABNORMAL LOW (ref 39.0–52.0)
Hemoglobin: 9 g/dL — ABNORMAL LOW (ref 13.0–17.0)
Hemoglobin: 8.9 g/dL — ABNORMAL LOW (ref 13.0–17.0)
MCH: 31.5 pg (ref 26.0–34.0)
MCH: 30.4 pg (ref 26.0–34.0)
MCHC: 32.8 g/dL (ref 30.0–36.0)
MCHC: 30.9 g/dL (ref 30.0–36.0)
MCV: 95.8 fL (ref 80.0–100.0)
MCV: 98.3 fL (ref 80.0–100.0)
Platelets: 144 10*3/uL — ABNORMAL LOW (ref 150–400)
Platelets: 167 10*3/uL (ref 150–400)
RBC: 2.86 MIL/uL — ABNORMAL LOW (ref 4.22–5.81)
RBC: 2.93 MIL/uL — ABNORMAL LOW (ref 4.22–5.81)
RDW: 16.8 % — ABNORMAL HIGH (ref 11.5–15.5)
RDW: 16.5 % — ABNORMAL HIGH (ref 11.5–15.5)
WBC: 4.7 10*3/uL (ref 4.0–10.5)
WBC: 5.4 10*3/uL (ref 4.0–10.5)
nRBC: 0 % (ref 0.0–0.2)
nRBC: 0 % (ref 0.0–0.2)

## 2023-09-27 LAB — BPAM FFP
Blood Product Expiration Date: 202412300925
ISSUE DATE / TIME: 202412291211
Unit Type and Rh: 6200

## 2023-09-27 LAB — GLUCOSE, CAPILLARY
Glucose-Capillary: 103 mg/dL — ABNORMAL HIGH (ref 70–99)
Glucose-Capillary: 120 mg/dL — ABNORMAL HIGH (ref 70–99)

## 2023-09-27 LAB — BPAM RBC
Blood Product Expiration Date: 202501262359
Blood Product Expiration Date: 202501272359
ISSUE DATE / TIME: 202412290932
ISSUE DATE / TIME: 202412271722
Unit Type and Rh: 202501262359
Unit Type and Rh: 6200
Unit Type and Rh: 6200

## 2023-09-27 LAB — APTT: aPTT: 78 s — ABNORMAL HIGH (ref 24–36)

## 2023-09-27 LAB — HEMOGLOBIN A1C
Hgb A1c MFr Bld: 5.5 % (ref 4.8–5.6)
Mean Plasma Glucose: 111 mg/dL

## 2023-09-27 LAB — PROTIME-INR
INR: 2.6 — ABNORMAL HIGH (ref 0.8–1.2)
INR: 2.5 — ABNORMAL HIGH (ref 0.8–1.2)
Prothrombin Time: 28.1 s — ABNORMAL HIGH (ref 11.4–15.2)
Prothrombin Time: 27.2 s — ABNORMAL HIGH (ref 11.4–15.2)

## 2023-09-27 LAB — BRAIN NATRIURETIC PEPTIDE: B Natriuretic Peptide: 295.2 pg/mL — ABNORMAL HIGH (ref 0.0–100.0)

## 2023-09-27 LAB — HEMOGLOBIN AND HEMATOCRIT, BLOOD
HCT: 28.7 % — ABNORMAL LOW (ref 39.0–52.0)
Hemoglobin: 9.3 g/dL — ABNORMAL LOW (ref 13.0–17.0)

## 2023-09-27 MED ORDER — DEXTROSE 5 % IV SOLN
5.0000 mg | Freq: Once | INTRAVENOUS | Status: AC
  Administered 2023-09-27: 5 mg via INTRAVENOUS
  Filled 2023-09-27: qty 0.5

## 2023-09-27 MED ORDER — SALINE SPRAY 0.65 % NA SOLN
2.0000 | NASAL | Status: DC | PRN
  Administered 2023-09-27 – 2023-10-23 (×6): 2 via NASAL
  Filled 2023-09-27 (×3): qty 44

## 2023-09-27 MED ORDER — IDARUCIZUMAB 2.5 GM/50ML IV SOLN
5.0000 g | INTRAVENOUS | Status: AC
  Administered 2023-09-27: 5 g via INTRAVENOUS
  Filled 2023-09-27: qty 100

## 2023-09-27 MED ORDER — SODIUM BICARBONATE 8.4 % IV SOLN: INTRAVENOUS | Status: DC

## 2023-09-27 MED ORDER — CARVEDILOL 3.125 MG PO TABS
3.1250 mg | ORAL_TABLET | Freq: Two times a day (BID) | ORAL | Status: DC
  Administered 2023-09-27 – 2023-10-06 (×16): 3.125 mg via ORAL
  Filled 2023-09-27 (×16): qty 1

## 2023-09-27 NOTE — Progress Notes (Signed)
OT Cancellation Note  Patient Details Name: Joshua [name] MRN: [id number] DOB: [birth year]   Cancelled Treatment:    Reason Eval/Treat Not Completed: Medical issues which prohibited therapy Per  nurse increased RR in bed with minimal movement. OT to continue to follow and check back on 12/31.  Rosalio Loud, MS Acute Rehabilitation Department Office# [phone]  09/27/2023, 3:08 PM

## 2023-09-27 NOTE — Telephone Encounter (Signed)
Error

## 2023-09-27 NOTE — Progress Notes (Signed)
Subjective Notified by patient's RN about gross hematuria with blood clots and increased oxygen requirement up to 5 L after desaturation to 80%.  Patient's wife and daughter at bedside.  Reports hematuria since this morning with small blood clots.  Patient complains stuffy nose that he has been dealing with for unknown period of time.  He attributed to dry air at home.  Usually, he he is able to get out some dark blood clots by blowing his nose.  He was able to do this successfully during my visit.  He is breathing felt better after removing the blood clots.  He maintained saturation in mid 90s to upper 90s on 2 L by nasal cannula.  He also have sleep apnea for which she is not using CPAP.  Patient denies chest pain, chest congestion or shortness of breath after he removed the blood clots from his nose.  Denies abdominal pain or dysuria.  Objective Vitals:   09/27/23 1640 09/27/23 1644 09/27/23 1645 09/27/23 1715  BP:    (!) 152/63  Pulse:    74  Resp:      Temp:      TempSrc:      SpO2: (!) 85% 96% (!) 85%   Weight:      Height:       GENERAL: No apparent distress.  Nontoxic. HEENT: MMM.  Vision and hearing grossly intact.  NECK: Supple.  No apparent JVD.  RESP:  No IWOB.  Fair aeration on the left.  Diminished aeration on the right. CVS:  RRR. Heart sounds normal.  ABD/GI/GU: BS+. Abd soft, NTND.  Noted small gross hematuria.  MSK/EXT:   No apparent deformity. Moves extremities. No edema.  SKIN: no apparent skin lesion or wound NEURO: Awake and alert. Oriented appropriately.  No apparent focal neuro deficit. PSYCH: Calm. Normal affect.   Assessment and plan Acute respiratory failure with hypoxia-likely due to nasal congestion from old blood clots and atelectasis.  He has been on IV fluid but appears euvolemic.  No crackles on exam.  Low suspicion for VTE without tachycardia or tachypnea.  Oxygen requirement improved after he removed a blood clot from his nose. -Discontinue IV  fluid -Saline nose spray -Chest x-ray, BNP -Minimum oxygen to keep saturation above 88% -Incentive spirometry -Transfer to progressive care  Hematuria with blood clots: Still coagulopathic after vitamin K this morning.  Able to void -Discussed with urology, Dr. Annabell Howells who is available if patient has difficulty voiding and increased hematuria. -Check CBC this evening. -Will redose Praxbind per pharmacy. -Recheck coag labs in the morning.  CRITICAL CARE Performed by: Almon Hercules   Total critical care time: 45 minutes  Critical care time was exclusive of separately billable procedures and treating other patients.  Critical care was necessary to treat or prevent imminent or life-threatening deterioration.  Critical care was time spent personally by me on the following activities: development of treatment plan with patient and/or surrogate as well as nursing, discussions with consultants, evaluation of patient's response to treatment, examination of patient, obtaining history from patient or surrogate, ordering and performing treatments and interventions, ordering and review of laboratory studies, ordering and review of radiographic studies, pulse oximetry and re-evaluation of patient's condition.

## 2023-09-27 NOTE — Progress Notes (Signed)
OT Cancellation Note  Patient Details Name: Joshua [name] MRN: [id number] DOB: 1950-10-16   Cancelled Treatment:    Reason Eval/Treat Not Completed: Fatigue/lethargy limiting ability to participate Approached this afternoon per family request via nurse with patient having just complete toileting with nursing staff. Family asking for therapy to hold until after procedure tomorrow. OT to continue to follow and check back after procedure on 12/31.  Rosalio Loud, MS Acute Rehabilitation Department Office# [phone]  09/27/2023, 4:29 PM

## 2023-09-27 NOTE — Plan of Care (Signed)
?  Problem: Nutrition: ?Goal: Adequate nutrition will be maintained ?Outcome: Progressing ?  ?Problem: Coping: ?Goal: Level of anxiety will decrease ?Outcome: Progressing ?  ?Problem: Elimination: ?Goal: Will not experience complications related to urinary retention ?Outcome: Progressing ?  ?

## 2023-09-27 NOTE — Plan of Care (Signed)

## 2023-09-27 NOTE — Progress Notes (Addendum)
[facility] Progress Note  CC:  Melena, anemia   Subjective: He stated having a rough night. He voiced concern that his IV leaked a moderate amount of blood last night which soiled his gown and bed linen which took several hours to get cleaned up. He passed one moderate volume melenic stool at 1 am and a smaller melenic smear of stool later a few hour later. No bright red blood per the rectum. He continues to have hematuria. No CP or SOB. His wife and daughter are at the bedside.    Objective:  Vital signs in last 24 hours: Temp:  [97.4 F (36.3 C)-98.7 F (37.1 C)] 98.6 F (37 C) (12/29 2300) Pulse Rate:  [53-72] 66 (12/30 0500) Resp:  [19-30] 26 (12/30 0500) BP: (106-167)/(39-80) 146/47 (12/30 0500) SpO2:  [90 %-100 %] 97 % (12/30 0500) Last BM Date : 09/27/23 General: Pale and fatigued appearing 72 year old male in no acute distress. Eyes: Pupils equal, round and reactive.  Left conjunctiva with erythematous streaks. Heart: Regular rate and rhythm, no murmurs. Pulm: Breath sounds clear throughout, decreased in the bases.  No crackles, wheezes or rhonchi.  On oxygen 3 L nasal cannula. Abdomen: Soft, nondistended.  Nontender.  Positive bowel sounds all 4 quadrants.  No palpable mass.  No bruit. Extremities: Mild upper and lower extremity edema.  Lower extremities with stasis dermatitis. Neurologic:  Alert and  oriented x 4.  Speech is clear.  Moves all extremities weakly. Psych:  Alert and cooperative. Normal mood and affect.  Intake/Output from previous day: 12/29 0701 - 12/30 0700 In: 3126.3 [I.V.:2411.4; Blood:714.9] Out: 1050 [Urine:1050] Intake/Output this shift: No intake/output data recorded.  Lab Results: Recent Labs    09/25/23 0317 09/25/23 1757 09/26/23 0242 09/26/23 0915 09/26/23 1710 09/27/23 0123  WBC 4.1  --  4.5  --   --  4.7  HGB 7.3*   < > 7.0* 8.0* 9.5* 9.0*  HCT 22.2*   < > 22.4* 24.5* 28.4* 27.4*  PLT 130*  --  154  --   --   144*   < > = values in this interval not displayed.   BMET Recent Labs    09/25/23 0317 09/26/23 0242 09/27/23 0123  NA 131* 135 139  K 4.3 4.1 4.1  CL 105 106 108  CO2 16* 18* 23  GLUCOSE 86 92 99  BUN 118* 104* 79*  CREATININE 3.98* 3.35* 2.60*  CALCIUM 8.4* 8.2* 8.1*   LFT Recent Labs    09/24/23 1227 09/25/23 0317 09/26/23 0242 09/27/23 0123  PROT 5.7* 5.2*  --   --   ALBUMIN 2.3* 2.1*   < > 2.3*  AST 23 20  --   --   ALT 12 11  --   --   ALKPHOS 30* 27*  --   --   BILITOT 1.0 1.0  --   --   BILIDIR 0.4*  --   --   --   IBILI 0.6  --   --   --    < > = values in this interval not displayed.   PT/INR Recent Labs    09/25/23 1458 09/26/23 0242  LABPROT 26.9* 33.0*  INR 2.5* 3.2*   Hepatitis Panel No results for input(s): "HEPBSAG", "HCVAB", "HEPAIGM", "HEPBIGM" in the last 72 hours.  US RENAL Result Date: 09/25/2023 CLINICAL DATA:  Acute renal injury EXAM: RENAL / URINARY TRACT ULTRASOUND COMPLETE COMPARISON:  06/17/2022 FINDINGS: Right Kidney: Renal measurements: 10.6 x  5.2 x 5.3 cm. = volume: 152 mL. Echogenicity within normal limits. No mass or hydronephrosis visualized. Left Kidney: Renal measurements: 12.1 x 5.5 x 5.3 cm. = volume: 187 mL. A 9.8 cm cyst is noted arising from the lower pole of the left kidney. This is similar to that seen on prior ultrasound examination. A smaller mid polar 2.1 cm cyst is noted as well. No further follow-up is necessary. Bladder: Decompressed Other: None. IMPRESSION: Left renal cysts which are simple in appearance and stable from previous exams. No follow-up is recommended. No other focal abnormality is noted. Electronically Signed   By: Alcide Clever M.D.   On: 09/25/2023 18:21   ECHOCARDIOGRAM COMPLETE Result Date: 09/25/2023    ECHOCARDIOGRAM REPORT   Patient Name:   Joshua [name] Date of Exam: 09/25/2023 Medical Rec #:  [id number]      Height:       68.0 in Accession #:    [id number]     Weight:       244.7 lb Date of  Birth:  13-May-1951      BSA:          2.227 m Patient Age:    72 years       BP:           122/39 mmHg Patient Gender: M              HR:           63 bpm. Exam Location:  Inpatient Procedure: 2D Echo, Cardiac Doppler and Color Doppler Indications:    Afib I48.91  History:        Patient has no prior history of Echocardiogram examinations.  Sonographer:    Harriette Bouillon RDCS Referring Phys: [phone] DAVID MANUEL ORTIZ IMPRESSIONS  1. Left ventricular ejection fraction, by estimation, is 55 to 60%. The left ventricle has normal function. The left ventricle has no regional wall motion abnormalities. Left ventricular diastolic parameters were normal.  2. Right ventricular systolic function is mildly reduced. The right ventricular size is normal.  3. Left atrial size was severely dilated.  4. The mitral valve is abnormal. No evidence of mitral valve regurgitation. No evidence of mitral stenosis.  5. The aortic valve is tricuspid. There is mild calcification of the aortic valve. There is mild thickening of the aortic valve. Aortic valve regurgitation is not visualized. Aortic valve sclerosis is present, with no evidence of aortic valve stenosis.  6. The inferior vena cava is dilated in size with <50% respiratory variability, suggesting right atrial pressure of 15 mmHg. FINDINGS  Left Ventricle: Left ventricular ejection fraction, by estimation, is 55 to 60%. The left ventricle has normal function. The left ventricle has no regional wall motion abnormalities. The left ventricular internal cavity size was normal in size. There is  no left ventricular hypertrophy. Left ventricular diastolic parameters were normal. Right Ventricle: The right ventricular size is normal. No increase in right ventricular wall thickness. Right ventricular systolic function is mildly reduced. Left Atrium: Left atrial size was severely dilated. Right Atrium: Right atrial size was normal in size. Pericardium: Trivial pericardial effusion is present.  The pericardial effusion is posterior to the left ventricle. Mitral Valve: The mitral valve is abnormal. There is mild thickening of the mitral valve leaflet(s). There is mild calcification of the mitral valve leaflet(s). Mild mitral annular calcification. No evidence of mitral valve regurgitation. No evidence of mitral valve stenosis. Tricuspid Valve: The tricuspid valve is normal in structure. Tricuspid valve  regurgitation is mild . No evidence of tricuspid stenosis. Aortic Valve: The aortic valve is tricuspid. There is mild calcification of the aortic valve. There is mild thickening of the aortic valve. Aortic valve regurgitation is not visualized. Aortic valve sclerosis is present, with no evidence of aortic valve stenosis. Pulmonic Valve: The pulmonic valve was normal in structure. Pulmonic valve regurgitation is trivial. No evidence of pulmonic stenosis. Aorta: The aortic root, ascending aorta and aortic arch are all structurally normal, with no evidence of dilitation or obstruction. Venous: The inferior vena cava is dilated in size with less than 50% respiratory variability, suggesting right atrial pressure of 15 mmHg. IAS/Shunts: No atrial level shunt detected by color flow Doppler.  LEFT VENTRICLE PLAX 2D LVIDd:         5.00 cm   Diastology LVIDs:         2.40 cm   LV e' medial:    5.55 cm/s LV PW:         1.10 cm   LV E/e' medial:  14.9 LV IVS:        1.10 cm   LV e' lateral:   7.94 cm/s LVOT diam:     2.00 cm   LV E/e' lateral: 10.4 LV SV:         95 LV SV Index:   43 LVOT Area:     3.14 cm  RIGHT VENTRICLE         IVC TAPSE (M-mode): 2.2 cm  IVC diam: 2.90 cm LEFT ATRIUM              Index        RIGHT ATRIUM           Index LA diam:        5.20 cm  2.34 cm/m   RA Area:     19.10 cm LA Vol (A2C):   108.0 ml 48.50 ml/m  RA Volume:   51.40 ml  23.08 ml/m LA Vol (A4C):   87.2 ml  39.16 ml/m LA Biplane Vol: 105.0 ml 47.15 ml/m  AORTIC VALVE LVOT Vmax:   143.00 cm/s LVOT Vmean:  106.000 cm/s LVOT VTI:     0.303 m  AORTA Ao Root diam: 3.10 cm Ao Asc diam:  3.70 cm MITRAL VALVE               TRICUSPID VALVE MV Area (PHT): 2.95 cm    TR Peak grad:   33.6 mmHg MV Decel Time: 257 msec    TR Vmax:        290.00 cm/s MV E velocity: 82.70 cm/s MV A velocity: 60.40 cm/s  SHUNTS MV E/A ratio:  1.37        Systemic VTI:  0.30 m                            Systemic Diam: 2.00 cm Charlton Haws MD Electronically signed by Charlton Haws MD Signature Date/Time: 09/25/2023/10:34:20 AM    Final    Patient Profile: 72 year old male with a history of hypertension, DM type II, CKD stage IIIa, pancytopenia/suspected myelodysplasia, coronary artery disease s/p stent placement 2009, atrial fibrillation on Pradaxa admitted to the hospital 09/24/2023 with progressive weakness, dizziness, shortness of breath, anemia and dark stools.  Assessment / Plan:   Symptomatic acute on chronic anemia with darks stools, suspect UGI  bleed on Pradaxa with supratherapeutic INR.  Admission hemoglobin 7.0 (baseline HG 13) and INR 9.5.  Transfused 1 unit of PRBCs -> Hg 7.3 -> Hg 7.0 transfused 1 unit of PRBCs on 12/29 -> Hg 0.5 -> today Hg 9.0.  Iron 47. Saturation ratios 17%. Ferritin 470. B12 level 2,137. Folate 5.6. EGD cancelled yesterday due to INR > 3.  Hemodynamically stable. -PT/INR -NPO this am for possible EGD if INR < 2, await further recommendations per Dr. Chales Abrahams -Continue PPI IV bid -Transfuse for hemoglobin less than 8 or as needed if symptomatic -Check H&H every 8 hours x 24 hours -IV fluids per the hospitalist ADDENDUM: INR resulted at 2.6. Clear liquid diet today then NPO after midnight. Tentatively schedule EGD with Dr. Chales Abrahams 09/28/2023. Pharm consult for Vitamin K IV dose x 1 today.  History of pancytopenia, suspected myelodysplasia with negative bone marrow biopsy 11/20923 followed by hematologist Dr. Jeanie Sewer.  Abdominal sonogram 05/2022 showed a normal liver and spleen. -Consider hematology consult during this  hospitalization  Coagulopathy, Pradaxa on hold. Admission INR 9.5 -> Received vitamin K and Praxbind -> INR 2.5 -> INR 3.2 on 12/29 -> transfused one unit of FFP on 12/29 -> today INR 2.6. -Daily PT/INR  -Pharm consult  for Vitamin K IV dose x 1 today  Mild thrombocytopenia, platelet count 144.  AKI on CKD with hematuria. Cr 3.35 -> 2.60.  Renal ultrasound showed left renal cysts without other focal abnormality.  CAD s/p stent placement 2009. LVEF 55 - 60%  Atrial fibrillation -Continue to hold Pradaxa  Diverticulosis  History of colon polyps.  Colonoscopy in 2015 identified 1 tubular adenomatous and 1 hyperplastic polyp removed   Principal Problem:   Gastrointestinal hemorrhage with melena Active Problems:   Type 2 diabetes mellitus (HCC)   Hyperlipidemia   Essential hypertension   Coronary artery disease due to lipid rich plaque   Paroxysmal atrial fibrillation (HCC)   Pulmonary hypertension (HCC)   Symptomatic anemia   ABLA (acute blood loss anemia)   Protein-calorie malnutrition, severe (HCC)   Thrombocytosis   Hyponatremia   AKI (acute kidney injury) (HCC)   CKD stage 3a, GFR 45-59 ml/min (HCC)     LOS: 3 days   Joshua [name]  09/27/2023, 8:24 AM   Attending physician's note   I have taken history, reviewed the chart and examined the patient. I performed a substantive portion of this encounter, including complete performance of at least one of the key components, in conjunction with the APP. I agree with the Advanced Practitioner's note, impression and recommendations.   Got signout from Dr. Russella Dar.  72 yo M with history of CAD S/P stents remotely, h/o afib/flutter on chronic Pradaxa, admitted through the ER with progressive weakness, dizziness shortness of breath, dark stool for 2 weeks. Hgb = 7 on adm (down from baseline of 13). Marked coagulopathy - received Vit K and Praxbind, FFP 12/28 for INR > 9.5 on adm. Now 2.6-still elevated for safe  EGD.  Also having hematuria.  Epistaxis has resolved.   Plan: -Continue Protonix 40 IV BID -Recheck PT/INR tomorrow -Additional vitamin K.  Will have pharmacy to dose. -EGD rescheduled for tomorrow.  Would prefer INR 2 or less. -Trend CBC.  Transfuse as needed.  -Agree to hold Pradaxa -[place] abdo in AM (for re-eval of liver, neg [place] 05/2022) -?Urology consultation for hematuria. -D/W mother and daughter   Edman Circle, MD  GI [phone]

## 2023-09-27 NOTE — Progress Notes (Signed)
Attempted to call report to 4W, nurse doing pt care and unable to take report at this time.

## 2023-09-27 NOTE — Progress Notes (Signed)
Patient transferred to room 1424 via bed, stable at time of transfer.

## 2023-09-27 NOTE — Progress Notes (Signed)
PROGRESS NOTE  Joshua [name] ZOX:[id number] DOB: 18-Dec-1950   PCP: [name], MD  Patient is from: Home.  Lives with his wife.  Ambulates holding to furniture and walls at baseline  DOA: 09/24/2023 LOS: 3  Chief complaints Chief Complaint  Patient presents with   Weakness   Dizziness   Shortness of Breath     Brief Narrative / Interim history: 73 year old M with PMH of PAF/flutter on Pradaxa, CAD s/p stents in 2009, DM-2, CKD-3A, OSA not on CPAP, HTN, HLD and osteoarthritis presenting with progressive generalized weakness, dizziness, dyspnea, melena and edema for weeks, and admitted with symptomatic anemia in the setting of GI bleed, AKI with uremia and hyponatremia.  In ED, vitals stable. Na 128. Cr 4.45 (1.4 on 8/13).  BUN 126.  Bicarb 15.  AG 12.  Hgb 7.0.  Platelets 148.  UA with microscopic hematuria.  One units of blood ordered.  Started on IV Protonix.  GI consulted.  EGD postponed due to elevated INR. Received IV vitamin K and Praxibind on 12/28 and FFP on 12/29.  Also received additional 1 unit of blood on 12/29.    AKI improving with IV fluid.  Renal US without acute finding.    Hypotension resolved.  TTE without significant finding.  Subjective: Seen and examined earlier this morning.  Daughter and mother at bedside.  They noted some hematuria, melena and hematochezia overnight.  Further epistaxis.  INR elevated to 2.6.  EGD postponed.  Objective: Vitals:   09/27/23 0500 09/27/23 0600 09/27/23 0700 09/27/23 0800  BP: (!) 146/47 138/62 (!) 133/36 (!) 151/51  Pulse: 66 63 60 66  Resp: (!) 26 (!) 26 (!) 24 (!) 21  Temp:    97.9 F (36.6 C)  TempSrc:    Oral  SpO2: 97% 100% 100% 100%  Weight:      Height:        Examination:  GENERAL: No apparent distress.  Nontoxic. HEENT: MMM.  Vision and hearing grossly intact.  NECK: Supple.  No apparent JVD.  RESP:  No IWOB.  Fair aeration bilaterally. CVS:  RRR. Heart sounds normal.  ABD/GI/GU: BS+. Abd  soft, NTND.  MSK/EXT:  Moves extremities. No apparent deformity. No edema.  SKIN: no apparent skin lesion or wound NEURO: Awake, alert and oriented appropriately.  No apparent focal neuro deficit. PSYCH: Calm. Normal affect.   Procedures:  None  Microbiology summarized: MRSA PCR screen nonreactive  Assessment and plan: Symptomatic acute blood loss anemia due to upper GI bleed: Patient is on Pradaxa for A-fib/flutter.  Hgb 7.0 (was 13.1 on 6/20).  Had melena for over 2 weeks.  Symptomatic.  Transfused 1 unit on 12/27 without significant improvement partly due to hemodilution from IV fluid.  S/p IV vitamin K and Praxibind on 12/28.  FFP and additional PRBC on 12/29.  Ordered additional 1 unit on 12/28 that he has not received yet.  Anemia panel without significant nutritional deficiency. Recent Labs    03/18/23 1026 09/24/23 1227 09/24/23 2348 09/25/23 0317 09/25/23 1757 09/26/23 0242 09/26/23 0915 09/26/23 1710 09/27/23 0123 09/27/23 0948  HGB 13.1 7.0* 7.5* 7.3* 7.8* 7.0* 8.0* 9.5* 9.0* 9.3*  -Continue holding Pradaxa.  May consider alternative anticoagulation on discharge -IV vitamin K 5 mg once.  Recheck coag labs in the afternoon.  May need additional Praxbind -Monitor H&H.  Goal Hgb > 8.0 given history of CAD.   -Continue IV Protonix -CLD per GI.  N.p.o. after midnight.  AKI on CKD-3A/uremia: ATN  from anemia?  Takes Benicar at home.  Denies NSAID use.  UA with hematuria.  Renal US without significant finding.  Improving. Recent Labs    03/18/23 1026 05/11/23 1007 09/24/23 1227 09/25/23 0317 09/26/23 0242 09/27/23 0123  BUN 29* 24 126* 118* 104* 79*  CREATININE 1.34* 1.41* 4.45* 3.98* 3.35* 2.60*  -Continue IV sodium bicarbonate at reduced rate. -Avoid nephrotoxic meds -Manage anemia as above -Continue monitoring -Strict intake and output with intermittent bladder scan     DM-2 with CKD-3A: A1c 5.5.  CBG within normal range. Recent Labs  Lab 09/26/23 0832  09/26/23 1203 09/26/23 1615 09/27/23 0835 09/27/23 1235  GLUCAP 87 93 101* 103* 120*  -Discontinue CBG and SSI -Hold home meds.   Essential hypertension: Soft BP with wide pulse pressure.  TTE without significant finding. -Continue holding home losartan and Imdur -Resume home Coreg at reduced dose   History of CAD s/p stent in 2009: No anginal symptoms. -Resume home Coreg at reduced dose. -Continue holding losartan and Imdur.   Paroxysmal atrial fibrillation (HCC):Per chart review, patient was switched from warfarin to Pradaxa in 2011.  May need to consider different DOAC's based on renal function.  Will discuss with his cardiologist when we get to the point. -Continue holding Pradaxa -Resume home Coreg at reduced dose. -Optimize electrolytes  Non-anion gap metabolic acidosis: Likely due to renal failure.  Improved -Continue IV sodium bicarbonate  Hyponatremia: Likely due to renal failure.  Resolved. -Continue monitoring  Epistaxis: Resolved.  H&H stable. -S/p  Praxibind on 12/28, vitamin K on 12/28 and 12/30, FFP on 12/29 -Advised patient not to pick his nose.   Hematuria: -Management as above.   Thrombocytopenia: Resolved. -Monitor  Physical deconditioning -PT/OT  Hyperlipidemia -Continue rosuvastatin   Elevated INR: 9.5 on 12/28>> 2.6 -S/p  Praxibind on 12/28, vitamin K on 12/28 and 12/30, FFP on 12/29 -Recheck coag labs in the afternoon.  May need additional Praxbind  Morbid obesity Body mass index is 37.21 kg/m.          DVT prophylaxis:  SCDs Start: 09/24/23 1503  Code Status: Full code Family Communication: Dated patient's wife and daughter at bedside Level of care: Telemetry Status is: Inpatient Remains inpatient appropriate because: Symptomatic acute blood loss anemia and AKI with uremia   Final disposition: Likely home once medically stable Consultants:  Gastroenterology  55 minutes with more than 50% spent in reviewing records,  counseling patient/family and coordinating care.   Sch Meds:  Scheduled Meds:  carvedilol  3.125 mg Oral BID WC   Chlorhexidine Gluconate Cloth  6 each Topical Daily   cyanocobalamin  500 mcg Oral Daily   pantoprazole (PROTONIX) IV  40 mg Intravenous Q12H   Continuous Infusions:  sodium bicarbonate 75 mEq in sodium chloride 0.45 % 1,075 mL infusion 75 mL/hr at 09/27/23 1328    PRN Meds:.acetaminophen **OR** acetaminophen, ondansetron **OR** ondansetron (ZOFRAN) IV, mouth rinse  Antimicrobials: Anti-infectives (From admission, onward)    None        I have personally reviewed the following labs and images: CBC: Recent Labs  Lab 09/24/23 1227 09/24/23 2348 09/25/23 0317 09/25/23 1757 09/26/23 0242 09/26/23 0915 09/26/23 1710 09/27/23 0123 09/27/23 0948  WBC 4.6  --  4.1  --  4.5  --   --  4.7  --   HGB 7.0*   < > 7.3*   < > 7.0* 8.0* 9.5* 9.0* 9.3*  HCT 20.8*   < > 22.2*   < > 22.4* 24.5*  28.4* 27.4* 28.7*  MCV 95.0  --  96.1  --  98.7  --   --  95.8  --   PLT 148*  --  130*  --  154  --   --  144*  --    < > = values in this interval not displayed.   BMP &GFR Recent Labs  Lab 09/24/23 1227 09/25/23 0317 09/26/23 0242 09/27/23 0123  NA 128* 131* 135 139  K 4.6 4.3 4.1 4.1  CL 101 105 106 108  CO2 15* 16* 18* 23  GLUCOSE 127* 86 92 99  BUN 126* 118* 104* 79*  CREATININE 4.45* 3.98* 3.35* 2.60*  CALCIUM 8.6* 8.4* 8.2* 8.1*  MG  --   --  2.5* 2.3  PHOS  --   --  4.2 3.3   Estimated Creatinine Clearance: 30.1 mL/min (A) (by C-G formula based on SCr of 2.6 mg/dL (H)). Liver & Pancreas: Recent Labs  Lab 09/24/23 1227 09/25/23 0317 09/26/23 0242 09/27/23 0123  AST 23 20  --   --   ALT 12 11  --   --   ALKPHOS 30* 27*  --   --   BILITOT 1.0 1.0  --   --   PROT 5.7* 5.2*  --   --   ALBUMIN 2.3* 2.1* 2.2* 2.3*   No results for input(s): "LIPASE", "AMYLASE" in the last 168 hours. No results for input(s): "AMMONIA" in the last 168  hours. Diabetic: Recent Labs    09/24/23 2348  HGBA1C 5.5   Recent Labs  Lab 09/26/23 0832 09/26/23 1203 09/26/23 1615 09/27/23 0835 09/27/23 1235  GLUCAP 87 93 101* 103* 120*   Cardiac Enzymes: Recent Labs  Lab 09/24/23 2348  CKTOTAL 122   No results for input(s): "PROBNP" in the last 8760 hours. Coagulation Profile: Recent Labs  Lab 09/24/23 2348 09/25/23 1458 09/26/23 0242 09/27/23 0735  INR 9.5* 2.5* 3.2* 2.6*   Thyroid Function Tests: No results for input(s): "TSH", "T4TOTAL", "FREET4", "T3FREE", "THYROIDAB" in the last 72 hours. Lipid Profile: No results for input(s): "CHOL", "HDL", "LDLCALC", "TRIG", "CHOLHDL", "LDLDIRECT" in the last 72 hours. Anemia Panel: Recent Labs    09/25/23 0317 09/25/23 1757  VITAMINB12  --  2,137*  FOLATE  --  5.6*  FERRITIN  --  470*  TIBC  --  283  IRON  --  47  RETICCTPCT 2.8  --    Urine analysis:    Component Value Date/Time   COLORURINE YELLOW 09/24/2023 1735   APPEARANCEUR HAZY (A) 09/24/2023 1735   LABSPEC 1.012 09/24/2023 1735   PHURINE 5.0 09/24/2023 1735   GLUCOSEU NEGATIVE 09/24/2023 1735   GLUCOSEU NEGATIVE 09/09/2009 1651   HGBUR LARGE (A) 09/24/2023 1735   BILIRUBINUR NEGATIVE 09/24/2023 1735   KETONESUR NEGATIVE 09/24/2023 1735   PROTEINUR 100 (A) 09/24/2023 1735   UROBILINOGEN 0.2 09/09/2009 1651   NITRITE NEGATIVE 09/24/2023 1735   LEUKOCYTESUR NEGATIVE 09/24/2023 1735   Sepsis Labs: Invalid input(s): "PROCALCITONIN", "LACTICIDVEN"  Microbiology: Recent Results (from the past 240 hours)  MRSA Next Gen by PCR, Nasal     Status: None   Collection Time: 09/24/23  4:42 PM   Specimen: Nasal Mucosa; Nasal Swab  Result Value Ref Range Status   MRSA by PCR Next Gen NOT DETECTED NOT DETECTED Final    Comment: (NOTE) The GeneXpert MRSA Assay (FDA approved for NASAL specimens only), is one component of a comprehensive MRSA colonization surveillance program. It is not intended to diagnose  MRSA  infection nor to guide or monitor treatment for MRSA infections. Test performance is not FDA approved in patients less than 30 years old. Performed at [facility], [address], [place], [place] [address]     Radiology Studies: No results found.     Shalla Bulluck T. Nastasha Reising [facility]  If 7PM-7AM, please contact night-coverage [email or web address] 09/27/2023, 1:31 PM

## 2023-09-27 NOTE — Progress Notes (Signed)
   09/27/23 1004  TOC Brief Assessment  Insurance and Status Reviewed  Patient has primary care physician Yes  Home environment has been reviewed home with spouse  Prior level of function: independent  Prior/Current Home Services No current home services  Social Drivers of Health Review SDOH reviewed no interventions necessary  Readmission risk has been reviewed Yes  Transition of care needs no transition of care needs at this time

## 2023-09-28 ENCOUNTER — Inpatient Hospital Stay (HOSPITAL_COMMUNITY): Payer: Medicare HMO

## 2023-09-28 ENCOUNTER — Encounter (HOSPITAL_COMMUNITY): Payer: Self-pay | Admitting: Certified Registered"

## 2023-09-28 DIAGNOSIS — J9601 Acute respiratory failure with hypoxia: Secondary | ICD-10-CM | POA: Diagnosis not present

## 2023-09-28 DIAGNOSIS — D62 Acute posthemorrhagic anemia: Secondary | ICD-10-CM | POA: Diagnosis not present

## 2023-09-28 DIAGNOSIS — I480 Paroxysmal atrial fibrillation: Secondary | ICD-10-CM | POA: Diagnosis not present

## 2023-09-28 DIAGNOSIS — J942 Hemothorax: Secondary | ICD-10-CM | POA: Diagnosis not present

## 2023-09-28 DIAGNOSIS — Z7901 Long term (current) use of anticoagulants: Secondary | ICD-10-CM | POA: Diagnosis not present

## 2023-09-28 DIAGNOSIS — D75839 Thrombocytosis, unspecified: Secondary | ICD-10-CM

## 2023-09-28 DIAGNOSIS — D649 Anemia, unspecified: Secondary | ICD-10-CM | POA: Diagnosis not present

## 2023-09-28 DIAGNOSIS — K921 Melena: Secondary | ICD-10-CM | POA: Diagnosis not present

## 2023-09-28 LAB — PROTEIN, PLEURAL OR PERITONEAL FLUID: Total protein, fluid: <3 g/dL

## 2023-09-28 LAB — RENAL FUNCTION PANEL
Albumin: 2.4 g/dL — ABNORMAL LOW (ref 3.5–5.0)
Anion gap: 10 (ref 5–15)
BUN: 61 mg/dL — ABNORMAL HIGH (ref 8–23)
CO2: 22 mmol/L (ref 22–32)
Calcium: 8.2 mg/dL — ABNORMAL LOW (ref 8.9–10.3)
Chloride: 104 mmol/L (ref 98–111)
Creatinine, Ser: 2.17 mg/dL — ABNORMAL HIGH (ref 0.61–1.24)
GFR, Estimated: 31 mL/min — ABNORMAL LOW (ref >60)
Glucose, Bld: 121 mg/dL — ABNORMAL HIGH (ref 70–99)
Phosphorus: 2.9 mg/dL (ref 2.5–4.6)
Potassium: 3.9 mmol/L (ref 3.5–5.1)
Sodium: 136 mmol/L (ref 135–145)

## 2023-09-28 LAB — PROTEIN, TOTAL: Total Protein: <3 g/dL — ABNORMAL LOW (ref 6.5–8.1)

## 2023-09-28 LAB — GLUCOSE, PLEURAL OR PERITONEAL FLUID: Glucose, Fluid: <20 mg/dL

## 2023-09-28 LAB — CBC
HCT: 29.3 % — ABNORMAL LOW (ref 39.0–52.0)
Hemoglobin: 9.1 g/dL — ABNORMAL LOW (ref 13.0–17.0)
MCH: 31.3 pg (ref 26.0–34.0)
MCHC: 31.1 g/dL (ref 30.0–36.0)
MCV: 100.7 fL — ABNORMAL HIGH (ref 80.0–100.0)
Platelets: 164 10*3/uL (ref 150–400)
RBC: 2.91 MIL/uL — ABNORMAL LOW (ref 4.22–5.81)
RDW: 16.2 % — ABNORMAL HIGH (ref 11.5–15.5)
WBC: 5.5 10*3/uL (ref 4.0–10.5)
nRBC: 0 % (ref 0.0–0.2)

## 2023-09-28 LAB — LACTATE DEHYDROGENASE, PLEURAL OR PERITONEAL FLUID: LD, Fluid: 937 U/L — ABNORMAL HIGH (ref 3–23)

## 2023-09-28 LAB — PROTIME-INR
INR: 1.3 — ABNORMAL HIGH (ref 0.8–1.2)
Prothrombin Time: 16.5 s — ABNORMAL HIGH (ref 11.4–15.2)

## 2023-09-28 LAB — BODY FLUID CELL COUNT WITH DIFFERENTIAL
Eos, Fluid: 3 %
Lymphs, Fluid: 15 %
Monocyte-Macrophage-Serous Fluid: 14 % — ABNORMAL LOW (ref 50–90)
Neutrophil Count, Fluid: 68 % — ABNORMAL HIGH (ref 0–25)
Total Nucleated Cell Count, Fluid: 2771 uL — ABNORMAL HIGH (ref 0–1000)

## 2023-09-28 LAB — MAGNESIUM: Magnesium: 2.3 mg/dL (ref 1.7–2.4)

## 2023-09-28 LAB — HEMOGLOBIN AND HEMATOCRIT, BLOOD
HCT: 29.3 % — ABNORMAL LOW (ref 39.0–52.0)
HCT: 29.5 % — ABNORMAL LOW (ref 39.0–52.0)
HCT: 31 % — ABNORMAL LOW (ref 39.0–52.0)
Hemoglobin: 9.1 g/dL — ABNORMAL LOW (ref 13.0–17.0)
Hemoglobin: 9.3 g/dL — ABNORMAL LOW (ref 13.0–17.0)
Hemoglobin: 9.5 g/dL — ABNORMAL LOW (ref 13.0–17.0)

## 2023-09-28 LAB — APTT
aPTT: 36 s (ref 24–36)
aPTT: 48 s — ABNORMAL HIGH (ref 24–36)

## 2023-09-28 LAB — LACTATE DEHYDROGENASE: LDH: 935 U/L — ABNORMAL HIGH (ref 98–192)

## 2023-09-28 NOTE — Consult Note (Addendum)
 NAME:  Joshua [name], MRN:  [id number], DOB:  [birth year], LOS: 4 ADMISSION DATE:  09/24/2023, CONSULTATION DATE:  09/28/23 REFERRING MD:  Joshua Bump, MD, CHIEF COMPLAINT:  Pleural effusion   History of Present Illness:  72 year old male with AF/flutter on Pradaxa , CAD s/p stents in 2009, DM2, CKD IIIA, HTN, HLD, OA OSA not on CPAP with presented with weakness, dizziness, SOB and melena admitted to [facility] for symptomatic anemia secondary to GI bleed and AKI. Since hospitalization he has received IVF, PRBC x 2U, PPI, vitamin K , praxibind and FFP. Planned for EGD today or tomorrow.  Yesterday had increased O2 requirement to 5L associated with epistaxis. Seen by [facility] and improved to 2L via [place] after blowing out nasal blood clots. CXR demonstrated loculated right pleural effusion. PCCM consulted for evaluation for thoracentesis.  Has shortness of breath with movement. Improved with oxygen. Denies past history of pleural effusion or need for thoracentesis  Pertinent  Medical History  AF/flutter on Pradaxa , CAD s/p stents in 2009, DM2, CKD IIIA, HTN, HLD, OA OSA not on CPAP  Significant Hospital Events: Including procedures, antibiotic start and stop dates in addition to other pertinent events     Interim History / Subjective:  As above  Objective   Blood pressure 132/66, pulse 77, temperature 98 F (36.7 C), temperature source Oral, resp. rate (!) 22, height 5' 8 (1.727 m), weight 111 kg, SpO2 98%.        Intake/Output Summary (Last 24 hours) at 09/28/2023 0933 Last data filed at 09/28/2023 0900 Gross per 24 hour  Intake 1797.23 ml  Output 1075 ml  Net 722.23 ml   Filed Weights   09/24/23 1114 09/24/23 1645  Weight: 83 kg 111 kg   Physical Exam: General: Elderly-appearing, no acute distress HENT: [place], AT, OP clear, MMM Eyes: EOMI, no scleral icterus Respiratory: Diminished right lung base. Clear to auscultation bilaterally.  No crackles, wheezing or rales Cardiovascular: RRR,  -M/R/G, no JVD Extremities:-Edema,-tenderness Neuro: AAO x4, CNII-XII grossly intact  H/H stable 8-9 BUN/Cr 61/2.17 INR 1.3  Resolved Hospital Problem list   N/A  Assessment & Plan:  Small hemothorax in setting of recent elevated INR Acute hypoxemic respiratory failure 2/2 aspiration during epistasis, hemothorax/atelectasis Improved O2 requirement. Wean for goal >88% S/p thoracentesis 12/31 F/u labs including cell count, culture, LDH, protein and cytology Post-procedure CXR ordered Vitals stable post procedure  Peri-operative Assessment of Pulmonary Risk for Non-Thoracic Surgery:  For Joshua [name], risk of perioperative pulmonary complications is increased by:  [X ]Age greater than 65 years  [ ] COPD  [ ] Serum albumin  <3.5  [ ] Smoking  [X ]Obstructive sleep apnea  [ ]  NYHA Class II Pulmonary Hypertension  ARISCAT: LOW RISK 1.6% risk of in-hospital post-op pulmonary complications (composite including respiratory failure, respiratory infection, pleural effusion, atelectasis, pneumothorax, bronchospasm, aspiration pneumonitis)  Respiratory complications generally occur in 1% of ASA Class I patients, 5% of ASA Class II and 10% of ASA Class III-IV patients These complications rarely result in mortality and include postoperative pneumonia, atelectasis, pulmonary embolism, ARDS and increased time requiring postoperative mechanical ventilation.  Overall, I recommend proceeding with the surgery if the risk for respiratory complications are outweighed by the potential benefits. This will need to be discussed between the patient and surgeon.  To reduce risks of respiratory complications, I recommend: --Pre- and post-operative incentive spirometry performed frequently while awake  Pulmonary will continue to follow  Best Practice (right click and Reselect all SmartList Selections daily)  Diet/type: NPO DVT prophylaxis not indicated in setting of bleed Pressure ulcer(s): pressure ulcer  assessment deferred  GI prophylaxis: PPI Lines: N/A Foley:  N/A Code Status:  full code Last date of multidisciplinary goals of care discussion [ per primary]  Labs   CBC: Recent Labs  Lab 09/25/23 0317 09/25/23 1757 09/26/23 0242 09/26/23 0915 09/27/23 0123 09/27/23 0948 09/27/23 1814 09/28/23 0342 09/28/23 0604  WBC 4.1  --  4.5  --  4.7  --  5.4  --  5.5  HGB 7.3*   < > 7.0*   < > 9.0* 9.3* 8.9* 9.1* 9.1*  HCT 22.2*   < > 22.4*   < > 27.4* 28.7* 28.8* 29.3* 29.3*  MCV 96.1  --  98.7  --  95.8  --  98.3  --  100.7*  PLT 130*  --  154  --  144*  --  167  --  164   < > = values in this interval not displayed.    Basic Metabolic Panel: Recent Labs  Lab 09/24/23 1227 09/25/23 0317 09/26/23 0242 09/27/23 0123 09/28/23 0604  NA 128* 131* 135 139 136  K 4.6 4.3 4.1 4.1 3.9  CL 101 105 106 108 104  CO2 15* 16* 18* 23 22  GLUCOSE 127* 86 92 99 121*  BUN 126* 118* 104* 79* 61*  CREATININE 4.45* 3.98* 3.35* 2.60* 2.17*  CALCIUM  8.6* 8.4* 8.2* 8.1* 8.2*  MG  --   --  2.5* 2.3 2.3  PHOS  --   --  4.2 3.3 2.9   GFR: Estimated Creatinine Clearance: 36.1 mL/min (A) (by C-G formula based on SCr of 2.17 mg/dL (H)). Recent Labs  Lab 09/26/23 0242 09/27/23 0123 09/27/23 1814 09/28/23 0604  WBC 4.5 4.7 5.4 5.5    Liver Function Tests: Recent Labs  Lab 09/24/23 1227 09/25/23 0317 09/26/23 0242 09/27/23 0123 09/28/23 0604  AST 23 20  --   --   --   ALT 12 11  --   --   --   ALKPHOS 30* 27*  --   --   --   BILITOT 1.0 1.0  --   --   --   PROT 5.7* 5.2*  --   --   --   ALBUMIN  2.3* 2.1* 2.2* 2.3* 2.4*   No results for input(s): LIPASE, AMYLASE in the last 168 hours. No results for input(s): AMMONIA in the last 168 hours.  ABG    Component Value Date/Time   HCO3 19.4 (L) 09/27/2014 1842   TCO2 20 09/27/2014 1842   ACIDBASEDEF 5.0 (H) 09/27/2014 1842   O2SAT 97.0 09/27/2014 1842     Coagulation Profile: Recent Labs  Lab 09/25/23 1458 09/26/23 0242  09/27/23 0735 09/27/23 1614 09/28/23 0604  INR 2.5* 3.2* 2.6* 2.5* 1.3*    Cardiac Enzymes: Recent Labs  Lab 09/24/23 2348  CKTOTAL 122    HbA1C: Hgb A1c MFr Bld  Date/Time Value Ref Range Status  09/24/2023 11:48 PM 5.5 4.8 - 5.6 % Final    Comment:    (NOTE)         Prediabetes: 5.7 - 6.4         Diabetes: >6.4         Glycemic control for adults with diabetes: <7.0   04/26/2018 09:02 AM 6.0 (H) 4.8 - 5.6 % Final    Comment:    (NOTE) Pre diabetes:          5.7%-6.4% Diabetes:              >  6.4% Glycemic control for   <7.0% adults with diabetes     CBG: Recent Labs  Lab 09/26/23 0832 09/26/23 1203 09/26/23 1615 09/27/23 0835 09/27/23 1235  GLUCAP 87 93 101* 103* 120*    Review of Systems:   Review of Systems  Constitutional:  Negative for chills, diaphoresis, fever, malaise/fatigue and weight loss.  HENT:  Negative for congestion.   Respiratory:  Positive for shortness of breath. Negative for cough, hemoptysis, sputum production and wheezing.   Cardiovascular:  Negative for chest pain, palpitations and leg swelling.     Past Medical History:  He,  has a past medical history of Arthritis, Atrial fibrillation (HCC), Atrial flutter (HCC), Back pain, CAD (coronary artery disease), Complication of anesthesia, DM (diabetes mellitus) (HCC), Elevated PSA, Gout, Hearing problem, HTN (hypertension), Hyperlipidemia, Myocardial infarct, old (2010), Prostatitis, and Renal insufficiency.   Surgical History:   Past Surgical History:  Procedure Laterality Date   CARDIAC CATHETERIZATION  11/07/07   with 2 stents   COLONOSCOPY     Electrophysiologic study and RF catheter ablation   of AV node reentrant tachycardia.     EXTERNAL EAR SURGERY Right    cartilage   LEFT AND RIGHT HEART CATHETERIZATION WITH CORONARY/GRAFT ANGIOGRAM N/A 09/27/2014   Procedure: LEFT AND RIGHT HEART CATHETERIZATION WITH EL BILE;  Surgeon: Victory LELON Claudene DOUGLAS, MD;  Location[facility]  CATH LAB;  Service: Cardiovascular;  Laterality: N/A;   LUMBAR LAMINECTOMY/DECOMPRESSION MICRODISCECTOMY Left 04/29/2018   Procedure: Left Lumbar Four-Five Laminectomy/Foraminotomy;  Surgeon: Joshua Alm RAMAN, MD;  [facility];  Service: Neurosurgery;  Laterality: Left;  Left Lumbar Four-Five Laminectomy/Foraminotomy   TOOTH EXTRACTION     VASECTOMY       Social History:   reports that he has never smoked. He has quit using smokeless tobacco.  His smokeless tobacco use included chew. He reports that he does not drink alcohol  and does not use drugs.   Family History:  His family history includes Alzheimer's disease in his mother; Cancer in his brother; Gout in his mother; Heart disease in his brother; Heart failure in his father; Hypertension in his mother.   Allergies Allergies  Allergen Reactions   Desipramine  Hives and Itching   Niacin Itching     Home Medications  Prior to Admission medications   Medication Sig Start Date End Date Taking? Authorizing Provider  carvedilol  (COREG ) 12.5 MG tablet TAKE 1 TABLET BY MOUTH TWICE A DAY 06/14/23  Yes Waddell Danelle LELON, MD  dabigatran  (PRADAXA ) 150 MG CAPS capsule TAKE 1 CAPSULE BY MOUTH TWICE A DAY 08/25/23  Yes Waddell Danelle LELON, MD  fenofibrate 160 MG tablet Take 160 mg by mouth daily. 06/16/20  Yes [provider]  ipratropium (ATROVENT) 0.06 % nasal spray Place 1 spray into both nostrils 3 (three) times daily as needed for rhinitis. 05/29/19  Yes [provider]  isosorbide  mononitrate (IMDUR ) 30 MG 24 hr tablet Take 30 mg by mouth daily. 03/11/23  Yes [provider]  nitroGLYCERIN  (NITROSTAT ) 0.4 MG SL tablet Place 0.4 mg under the tongue every 5 (five) minutes x 3 doses as needed for chest pain.   Yes [provider]  olmesartan  (BENICAR ) 40 MG tablet Take 1 tablet (40 mg total) by mouth daily. 04/27/23  Yes Waddell Danelle LELON, MD  pioglitazone  (ACTOS ) 15 MG tablet Take 15 mg by mouth daily.   Yes [provider]  rosuvastatin  (CRESTOR ) 20 MG tablet Take 20 mg by mouth daily. 03/30/20  Yes  [provider]  TYLENOL  8 HOUR ARTHRITIS PAIN 650 MG CR tablet Take 650-1,300 mg by mouth every 8 (eight) hours as needed for pain.   Yes [provider]  vitamin B-12 (CYANOCOBALAMIN ) 500 MCG tablet Take 500 mcg by mouth daily.   Yes [provider]     Critical care time: N/A    Care Time: 60 min  I have spent a total time of60-minutes on the day of the appointment including chart review, data review, collecting history, coordinating care and discussing medical diagnosis and plan with the patient/family. Past medical history, allergies, medications were reviewed. Pertinent imaging, labs and tests included in this note have been reviewed and interpreted independently by me.  Slater Staff, M.D. [facility] Pulmonary/Critical Care Medicine 09/28/2023 9:33 AM   See Tracey for personal pager For hours between 7 PM to 7 AM, please call Elink for urgent questions

## 2023-09-28 NOTE — Evaluation (Signed)
 Physical Therapy Evaluation Patient Details Name: Joshua [name] MRN: [id number] DOB: 10-Jun-1951 Today's Date: 09/28/2023  History of Present Illness  72 y.o. male  who was brought by his daughter and wife to the emergency department due to several weeks of progressively worse generalized weakness associated with dizziness and dyspnea. Dx of respiratory failure with hypoxia, hematuria. Pt with medical history significant of osteoarthritis, paroxysmal atrial fibrillation, atrial flutter, history of back surgery, back pain, history of CAD with stents placed in 2009, type 2 diabetes, elevated PSA, gout, hearing problem, hypertension, hyperlipidemia, prostatitis, renal insufficiency.  Clinical Impression  Pt admitted with above diagnosis. Pt ambulated 6' with RW, distance limited by 3/4 dyspnea. SpO2 95% at rest on room air, 79% on room air with walking. At baseline pt is independent with mobility and ADLs. Good progress expected.  Pt currently with functional limitations due to the deficits listed below (see PT Problem List). Pt will benefit from acute skilled PT to increase their independence and safety with mobility to allow discharge.           If plan is discharge home, recommend the following: A little help with walking and/or transfers;A little help with bathing/dressing/bathroom;Assistance with cooking/housework;Assist for transportation;Help with stairs or ramp for entrance   Can travel by private vehicle        Equipment Recommendations Rollator (4 wheels)  Recommendations for Other Services       Functional Status Assessment Patient has had a recent decline in their functional status and demonstrates the ability to make significant improvements in function in a reasonable and predictable amount of time.     Precautions / Restrictions Precautions Precautions: Other (comment);Fall Precaution Comments: denies falls in past 6 months; monitor O2 Restrictions Weight Bearing  Restrictions Per Provider Order: No      Mobility  Bed Mobility               General bed mobility comments: sitting up at edge of bed    Transfers Overall transfer level: Needs assistance Equipment used: Rolling walker (2 wheels) Transfers: Sit to/from Stand Sit to Stand: From elevated surface, Min assist                Ambulation/Gait Ambulation/Gait assistance: Contact guard assist Gait Distance (Feet): 6 Feet Assistive device: Rolling walker (2 wheels) Gait Pattern/deviations: Step-through pattern, Decreased stride length Gait velocity: decr     General Gait Details: steady, no loss of balance, distance limited by fatigue, 3/4 dyspnea, SpO2 79% on room air walking  Stairs            Wheelchair Mobility     Tilt Bed    Modified Rankin (Stroke Patients Only)       Balance Overall balance assessment: Needs assistance   Sitting balance-Leahy Scale: Good     Standing balance support: During functional activity, Bilateral upper extremity supported Standing balance-Leahy Scale: Fair                               Pertinent Vitals/Pain Pain Assessment Pain Assessment: No/denies pain    Home Living Family/patient expects to be discharged to:: Private residence Living Arrangements: Spouse/significant other Available Help at Discharge: Family;Available 24 hours/day Type of Home: House Home Access: Stairs to enter Entrance Stairs-Rails: None Entrance Stairs-Number of Steps: 4   Home Layout: One level Home Equipment: Cane - single point      Prior Function Prior Level of Function :  Independent/Modified Independent             Mobility Comments: walks with SPC at times, usually no AD, denies falls in past 6 months ADLs Comments: independent     Extremity/Trunk Assessment   Upper Extremity Assessment Upper Extremity Assessment: Defer to OT evaluation    Lower Extremity Assessment Lower Extremity Assessment: Overall  WFL for tasks assessed    Cervical / Trunk Assessment Cervical / Trunk Assessment: Normal  Communication   Communication Communication: No apparent difficulties  Cognition Arousal: Alert Behavior During Therapy: WFL for tasks assessed/performed Overall Cognitive Status: Within Functional Limits for tasks assessed                                          General Comments      Exercises     Assessment/Plan    PT Assessment Patient needs continued PT services  PT Problem List Decreased activity tolerance;Cardiopulmonary status limiting activity;Decreased mobility;Decreased balance       PT Treatment Interventions DME instruction;Gait training;Functional mobility training;Therapeutic activities;Patient/family education;Therapeutic exercise;Balance training    PT Goals (Current goals can be found in the Care Plan section)  Acute Rehab PT Goals Patient Stated Goal: return to independence with mobility PT Goal Formulation: With patient/family Time For Goal Achievement: 10/12/23 Potential to Achieve Goals: Good    Frequency Min 1X/week     Co-evaluation               AM-PAC PT 6 Clicks Mobility  Outcome Measure Help needed turning from your back to your side while in a flat bed without using bedrails?: A Little Help needed moving from lying on your back to sitting on the side of a flat bed without using bedrails?: A Little Help needed moving to and from a bed to a chair (including a wheelchair)?: A Little Help needed standing up from a chair using your arms (e.g., wheelchair or bedside chair)?: A Little Help needed to walk in hospital room?: A Little Help needed climbing 3-5 steps with a railing? : A Lot 6 Click Score: 17    End of Session Equipment Utilized During Treatment: Gait belt;Oxygen Activity Tolerance: Patient limited by fatigue Patient left: in chair;with call bell/phone within reach;with family/visitor present Nurse Communication:  Mobility status PT Visit Diagnosis: Difficulty in walking, not elsewhere classified (R26.2)    Time: 8886-8870 PT Time Calculation (min) (ACUTE ONLY): 16 min   Charges:   PT Evaluation $PT Eval Moderate Complexity: 1 Mod   PT General Charges $$ ACUTE PT VISIT: 1 Visit        Sylvan Delon Copp PT 09/28/2023  Acute Rehabilitation Services  Office [phone]

## 2023-09-28 NOTE — Procedures (Signed)
 Thoracentesis  Procedure Note  Joshua [name]  [id number]  December 05, 1950  Date:09/28/23  Time:11:17 AM   Provider Performing:Liberty Stead Slater Staff, MD  Procedure: Thoracentesis with imaging guidance (67444)  Indication(s) Pleural Effusion  Consent Risks of the procedure as well as the alternatives and risks of each were explained to the patient and/or caregiver.  Consent for the procedure was obtained and is signed in the bedside chart  Anesthesia Topical only with 1% lidocaine  , 8 cc   Time Out Verified patient identification, verified procedure, site/side was marked, verified correct patient position, special equipment/implants available, medications/allergies/relevant history reviewed, required imaging and test results available.   Sterile Technique Maximal sterile technique including full sterile barrier drape, hand hygiene, sterile gown, sterile gloves, mask, hair covering, sterile ultrasound probe cover (if used).  Procedure Description Ultrasound was used to identify appropriate pleural anatomy for placement and overlying skin marked.  Area of drainage cleaned and draped in sterile fashion. Lidocaine  was used to anesthetize the skin and subcutaneous tissue.  150 cc's of dark bloody appearing fluid was drained from the right pleural space. Catheter then removed and bandaid applied to site.    Complications/Tolerance None; patient tolerated the procedure well. Chest X-ray is ordered to confirm no post-procedural complication.   EBL Minimal   Specimen(s) Pleural fluid

## 2023-09-28 NOTE — TOC Initial Note (Signed)
 Transition of Care [facility]) - Initial/Assessment Note    Patient Details  Name: Joshua [name] MRN: [id number] Date of Birth: May 12, 1951  Transition of [facility]) CM/SW Contact:    Joshua CHRISTELLA Eva, LCSW Phone Number: 09/28/2023, 3:48 PM  Clinical Narrative:                 CSW met with the pt and family at the bedside to discuss home health recommendations. The pt has declined HH services. The pt is requesting a rolling walker and has no preference for a DME company. The pt will need DME orders, and the MD has been made aware. The pt denies any transportation needs. A referral has been sent to [facility] for the walker to be delivered to the pt's room prior to discharge. TOC to follow for discharge need.  Expected Discharge Plan: Home/Self Care Barriers to Discharge: No Barriers Identified   Patient Goals and CMS Choice Patient states their goals for this hospitalization and ongoing recovery are:: retrun home CMS Medicare.gov Compare Post Acute Care list provided to:: Patient Choice offered to / list presented to : Patient      Expected Discharge Plan and Services       Living arrangements for the past 2 months: Single Family Home                                      Prior Living Arrangements/Services Living arrangements for the past 2 months: Single Family Home Lives with:: Self, Spouse Patient language and need for interpreter reviewed:: Yes Do you feel safe going back to the place where you live?: Yes      Need for Family Participation in Patient Care: No (Comment) Care giver support system in place?: No (comment) Current home services: DME Criminal Activity/Legal Involvement Pertinent to Current Situation/Hospitalization: No - Comment as needed  Activities of Daily Living   ADL Screening (condition at time of admission) Independently performs ADLs?: No Does the patient have a NEW difficulty with bathing/dressing/toileting/self-feeding that is expected to  last >3 days?: No Does the patient have a NEW difficulty with getting in/out of bed, walking, or climbing stairs that is expected to last >3 days?: No Does the patient have a NEW difficulty with communication that is expected to last >3 days?: No Is the patient deaf or have difficulty hearing?: Yes Does the patient have difficulty seeing, even when wearing glasses/contacts?: No Does the patient have difficulty concentrating, remembering, or making decisions?: No  Permission Sought/Granted                  Emotional Assessment Appearance:: Appears stated age Attitude/Demeanor/Rapport: Engaged Affect (typically observed): Accepting Orientation: : Oriented to  Time, Oriented to Place, Oriented to Self, Oriented to Situation   Psych Involvement: No (comment)  Admission diagnosis:  UGIB (upper gastrointestinal bleed) [K92.2] Gastrointestinal hemorrhage with melena [K92.1] Patient Active Problem List   Diagnosis Date Noted   Symptomatic anemia 09/24/2023   Gastrointestinal hemorrhage with melena 09/24/2023   Hyperglycemia 09/24/2023   ABLA (acute blood loss anemia) 09/24/2023   Protein-calorie malnutrition, severe (HCC) 09/24/2023   Thrombocytosis 09/24/2023   Hyponatremia 09/24/2023   AKI (acute kidney injury) (HCC) 09/24/2023   CKD stage 3a, GFR 45-59 ml/min (HCC) 09/24/2023   S/P lumbar laminectomy 04/29/2018   Hearing problem    Chest pain, unspecified 06/23/2016   Abnormal nuclear stress test 09/27/2014   Crescendo  angina (HCC) 09/26/2014   Pulmonary hypertension (HCC) 09/26/2014   Internal hemorrhoids 01/19/2014   Diverticulosis of colon without hemorrhage 01/19/2014   Paroxysmal atrial fibrillation (HCC) 04/29/2010   PALPITATIONS 04/21/2010   External hemorrhoids 11/01/2009   HYPERKALEMIA 09/09/2009   Type 2 diabetes mellitus (HCC) 08/16/2009   Hyperlipidemia 08/16/2009   Essential hypertension 08/16/2009   Coronary artery disease due to lipid rich plaque  08/16/2009   Disorder resulting from impaired renal function 08/16/2009   PCP:  Joshua Toribio MATSU, MD Pharmacy:   [facility] [phone] - [address], [place] - [address] AT [facility] [address] [place] [address] Phone: [phone] Fax: [phone]     Social Drivers of Health (SDOH) Social History: SDOH Screenings   Food Insecurity: No Food Insecurity (09/24/2023)  Housing: Low Risk  (09/25/2023)  Transportation Needs: No Transportation Needs (09/24/2023)  Utilities: Not At Risk (09/24/2023)  Social Connections: Patient Unable To Answer (09/27/2023)  Tobacco Use: Medium Risk (09/24/2023)   SDOH Interventions:     Readmission Risk Interventions    09/27/2023   10:04 AM  Readmission Risk Prevention Plan  Transportation Screening Complete  PCP or Specialist Appt within 5-7 Days Complete  Home Care Screening Complete  Medication Review (RN CM) Complete

## 2023-09-28 NOTE — Progress Notes (Addendum)
 PROGRESS NOTE  Joshua [name] FMW:[id number] DOB: May 18, 1951   PCP: Yolande Toribio MATSU, MD  Patient is from: Home.  Lives with his wife.  Ambulates holding to furniture and walls at baseline  DOA: 09/24/2023 LOS: 4  Chief complaints Chief Complaint  Patient presents with   Weakness   Dizziness   Shortness of Breath     Brief Narrative / Interim history: 72 year old M with PMH of PAF/flutter on Pradaxa , CAD s/p stents in 2009, DM-2, CKD-3A, OSA not on CPAP, HTN, HLD and osteoarthritis presenting with progressive generalized weakness, dizziness, dyspnea, melena and edema for weeks, and admitted with symptomatic anemia in the setting of GI bleed, AKI with uremia and hyponatremia.  In ED, vitals stable. Na 128. Cr 4.45 (1.4 on 8/13).  BUN 126.  Bicarb 15.  AG 12.  Hgb 7.0.  Platelets 148.  UA with microscopic hematuria.  One units of blood ordered.  Started on IV Protonix .  GI consulted.  EGD postponed due to elevated INR. Received IV vitamin K  and Praxibind on 12/28 and FFP on 12/29.  Also received additional 1 unit of blood on 12/29.    AKI improving with IV fluid.  Renal US  without acute finding.    Hypotension resolved.  TTE without significant finding.  Had an episode of increased oxygen requirement in the evening of 12/30.  Chest x-ray showed loculated right moderate pleural effusion.  PCCM consulted.  Thora yielded 150 cc dark bloody appearing fluid.   Subjective: Seen and examined earlier this morning.  Seen and examined earlier this morning.  No major events overnight of this morning.  Has no complaints.  Hematuria has resolved.  Denies chest pain or shortness of breath.  No further GI bleed either.  Discussed about chest x-ray findings and plan.  Daughter and wife at bedside.  Objective: Vitals:   09/28/23 1050 09/28/23 1055 09/28/23 1100 09/28/23 1326  BP: 132/66 131/74 123/76 131/63  Pulse: 76 69 69 73  Resp:    18  Temp:    97.9 F (36.6 C)  TempSrc:    Oral  SpO2:  100% 100% 100% 100%  Weight:      Height:        Examination:  GENERAL: No apparent distress.  Nontoxic. HEENT: MMM.  Vision and hearing grossly intact.  NECK: Supple.  No apparent JVD.  RESP:  No IWOB.  Diminished aeration at right lung base. CVS:  RRR. Heart sounds normal.  ABD/GI/GU: BS+. Abd soft, NTND.  MSK/EXT:  Moves extremities. No apparent deformity. No edema.  SKIN: no apparent skin lesion or wound NEURO: Awake, alert and oriented appropriately.  No apparent focal neuro deficit. PSYCH: Calm. Normal affect.   Procedures:  12/31-right thoracocentesis with removal of 150 cc dark bloody appearing fluid  Microbiology summarized: MRSA PCR screen nonreactive  Assessment and plan: Symptomatic ABLA due to upper GIB, right hemothorax: Patient is on Pradaxa  for A-fib/flutter.  Hgb 7.0 (was 13.1 on 6/20).  Had melena for over 2 weeks.  Was symptomatic.  S/p IV vitamin K  and Praxibind on 12/28 and 12/30.  Also received FFP on 12/29.  Received 2 units of PRBC so far.  H&H stable.  Anemia panel without significant nutritional deficiency. Recent Labs    09/25/23 1757 09/26/23 0242 09/26/23 0915 09/26/23 1710 09/27/23 0123 09/27/23 0948 09/27/23 1814 09/28/23 0342 09/28/23 0604 09/28/23 1112  HGB 7.8* 7.0* 8.0* 9.5* 9.0* 9.3* 8.9* 9.1* 9.1* 9.3*  -Continue holding Pradaxa .  May consider alternative anticoagulation  on discharge -Monitor H&H.  Goal Hgb > 8.0 given history of CAD.   -Continue IV Protonix  -CLD per GI.  Acute respiratory failure with hypoxia: Multifactorial including untreated OSA, atelectasis and right hemithorax.  Right-sided hemothorax: Likely due to Pradaxa .  He is very coagulopathic.  Coagulopathy reversed. -Desaturated to 80% on 3 L the evening of 12/30.  Improved.  100% on 2 L currently. -Appreciate help by pulmonology -S/p thoracocentesis with removal of 150 cc dark bloody appearing fluid.  Fluid is exudative and neutrophilic -Encourage incentive  spirometry, OOB -Follow body fluid culture and cytology. -Minimum oxygen to keep saturation above 88%  AKI on CKD-3A/uremia: Baseline Cr appears to be around 1.4.  ATN from anemia?  Takes Benicar  at home.  Denies NSAID use.  UA with hematuria.  Renal US  without significant finding.  Improved. Recent Labs    03/18/23 1026 05/11/23 1007 09/24/23 1227 09/25/23 0317 09/26/23 0242 09/27/23 0123 09/28/23 0604  BUN 29* 24 126* 118* 104* 79* 61*  CREATININE 1.34* 1.41* 4.45* 3.98* 3.35* 2.60* 2.17*  -Continue monitoring of IV fluid -Avoid nephrotoxic meds -Manage anemia as above -Continue monitoring -Strict intake and output with intermittent bladder scan  DM-2 with CKD-3A: A1c 5.5.  CBG within normal range. Recent Labs  Lab 09/26/23 0832 09/26/23 1203 09/26/23 1615 09/27/23 0835 09/27/23 1235  GLUCAP 87 93 101* 103* 120*  -Discontinue CBG and SSI -Hold home meds.   Essential hypertension: Soft BP with wide pulse pressure.  TTE without significant finding. -Continue holding home losartan and Imdur  -Resume home Coreg  at reduced dose   History of CAD s/p stent in 2009: No anginal symptoms. -Resume home Coreg  at reduced dose. -Continue holding losartan and Imdur .   Paroxysmal atrial fibrillation (HCC):Per chart review, patient was switched from warfarin to Pradaxa  in 2011.  May need to consider different DOAC's based on renal function.  Will discuss with his cardiologist when we get to the point. -Continue holding Pradaxa  -Continue home Coreg  at reduced dose. -Optimize electrolytes  Non-anion gap metabolic acidosis: Likely due to renal failure.  Resolved.  Hyponatremia: Likely due to renal failure.  Resolved. -Continue monitoring  Epistaxis: Chronic issue likely from Pradaxa .  Resolved.  H&H stable. -S/p  Praxibind on 12/28 and vitamin K  on 12/28 and 12/30, and FFP on 12/29 -Advised patient not to pick his nose.   Gross hematuria: Likely due to coagulopathy.   Resolved. -Discussed with urology, Dr. Watt on 12/30.   Thrombocytopenia: Resolved.  Physical deconditioning -PT/OT  Hyperlipidemia -Continue rosuvastatin    Elevated INR: 9.5 on 12/28>> 1.3. -S/p Praxbind , vitamin K  and FFP.  Morbid obesity: Elevated BMI with comorbidity as above including hypertension, CAD and renal failure Body mass index is 37.21 kg/m.          DVT prophylaxis:  SCDs Start: 09/24/23 1503  Code Status: Full code Family Communication: Dated patient's wife and daughter at bedside Level of care: Progressive Status is: Inpatient Remains inpatient appropriate because: Symptomatic acute blood loss anemia, respiratory failure, right hemothorax and AKI with uremia   Final disposition: Likely home once medically stable Consultants:  Gastroenterology Pulmonology  55 minutes with more than 50% spent in reviewing records, counseling patient/family and coordinating care.   Sch Meds:  Scheduled Meds:  carvedilol   3.125 mg Oral BID WC   Chlorhexidine  Gluconate Cloth  6 each Topical Daily   cyanocobalamin   500 mcg Oral Daily   pantoprazole  (PROTONIX ) IV  40 mg Intravenous Q12H   Continuous Infusions:  PRN Meds:.acetaminophen  **OR** acetaminophen , ondansetron  **OR** ondansetron  (ZOFRAN ) IV, mouth rinse, sodium chloride   Antimicrobials: Anti-infectives (From admission, onward)    None        I have personally reviewed the following labs and images: CBC: Recent Labs  Lab 09/25/23 0317 09/25/23 1757 09/26/23 0242 09/26/23 0915 09/27/23 0123 09/27/23 0948 09/27/23 1814 09/28/23 0342 09/28/23 0604 09/28/23 1112  WBC 4.1  --  4.5  --  4.7  --  5.4  --  5.5  --   HGB 7.3*   < > 7.0*   < > 9.0* 9.3* 8.9* 9.1* 9.1* 9.3*  HCT 22.2*   < > 22.4*   < > 27.4* 28.7* 28.8* 29.3* 29.3* 29.5*  MCV 96.1  --  98.7  --  95.8  --  98.3  --  100.7*  --   PLT 130*  --  154  --  144*  --  167  --  164  --    < > = values in this interval not displayed.    BMP &GFR Recent Labs  Lab 09/24/23 1227 09/25/23 0317 09/26/23 0242 09/27/23 0123 09/28/23 0604  NA 128* 131* 135 139 136  K 4.6 4.3 4.1 4.1 3.9  CL 101 105 106 108 104  CO2 15* 16* 18* 23 22  GLUCOSE 127* 86 92 99 121*  BUN 126* 118* 104* 79* 61*  CREATININE 4.45* 3.98* 3.35* 2.60* 2.17*  CALCIUM  8.6* 8.4* 8.2* 8.1* 8.2*  MG  --   --  2.5* 2.3 2.3  PHOS  --   --  4.2 3.3 2.9   Estimated Creatinine Clearance: 36.1 mL/min (A) (by C-G formula based on SCr of 2.17 mg/dL (H)). Liver & Pancreas: Recent Labs  Lab 09/24/23 1227 09/25/23 0317 09/26/23 0242 09/27/23 0123 09/28/23 0604 09/28/23 1112  AST 23 20  --   --   --   --   ALT 12 11  --   --   --   --   ALKPHOS 30* 27*  --   --   --   --   BILITOT 1.0 1.0  --   --   --   --   PROT 5.7* 5.2*  --   --   --  <3.0*  ALBUMIN  2.3* 2.1* 2.2* 2.3* 2.4*  --    No results for input(s): LIPASE, AMYLASE in the last 168 hours. No results for input(s): AMMONIA in the last 168 hours. Diabetic: No results for input(s): HGBA1C in the last 72 hours.  Recent Labs  Lab 09/26/23 0832 09/26/23 1203 09/26/23 1615 09/27/23 0835 09/27/23 1235  GLUCAP 87 93 101* 103* 120*   Cardiac Enzymes: Recent Labs  Lab 09/24/23 2348  CKTOTAL 122   No results for input(s): PROBNP in the last 8760 hours. Coagulation Profile: Recent Labs  Lab 09/25/23 1458 09/26/23 0242 09/27/23 0735 09/27/23 1614 09/28/23 0604  INR 2.5* 3.2* 2.6* 2.5* 1.3*   Thyroid Function Tests: No results for input(s): TSH, T4TOTAL, FREET4, T3FREE, THYROIDAB in the last 72 hours. Lipid Profile: No results for input(s): CHOL, HDL, LDLCALC, TRIG, CHOLHDL, LDLDIRECT in the last 72 hours. Anemia Panel: Recent Labs    09/25/23 1757  VITAMINB12 2,137*  FOLATE 5.6*  FERRITIN 470*  TIBC 283  IRON 47   Urine analysis:    Component Value Date/Time   COLORURINE YELLOW 09/24/2023 1735   APPEARANCEUR HAZY (A) 09/24/2023 1735    LABSPEC 1.012 09/24/2023 1735   PHURINE 5.0 09/24/2023 1735  GLUCOSEU NEGATIVE 09/24/2023 1735   GLUCOSEU NEGATIVE 09/09/2009 1651   HGBUR LARGE (A) 09/24/2023 1735   BILIRUBINUR NEGATIVE 09/24/2023 1735   KETONESUR NEGATIVE 09/24/2023 1735   PROTEINUR 100 (A) 09/24/2023 1735   UROBILINOGEN 0.2 09/09/2009 1651   NITRITE NEGATIVE 09/24/2023 1735   LEUKOCYTESUR NEGATIVE 09/24/2023 1735   Sepsis Labs: Invalid input(s): PROCALCITONIN, LACTICIDVEN  Microbiology: Recent Results (from the past 240 hours)  MRSA Next Gen by PCR, Nasal     Status: None   Collection Time: 09/24/23  4:42 PM   Specimen: Nasal Mucosa; Nasal Swab  Result Value Ref Range Status   MRSA by PCR Next Gen NOT DETECTED NOT DETECTED Final    Comment: (NOTE) The GeneXpert MRSA Assay (FDA approved for NASAL specimens only), is one component of a comprehensive MRSA colonization surveillance program. It is not intended to diagnose MRSA infection nor to guide or monitor treatment for MRSA infections. Test performance is not FDA approved in patients less than 95 years old. Performed at [facility], [address] [place], [place] [address]     Radiology Studies: US  ABDOMEN LIMITED RUQ (LIVER/GB) Result Date: 09/28/2023 CLINICAL DATA:  Hepatic steatosis. EXAM: ULTRASOUND ABDOMEN LIMITED RIGHT UPPER QUADRANT COMPARISON:  June 17, 2022. FINDINGS: Gallbladder: 2.4 cm gallstone is noted without significant gallbladder wall thickening or pericholecystic fluid. No sonographic Murphy's sign. Common bile duct: Diameter: 7 mm which is within normal limits. Liver: No focal lesion identified. Mildly increased echogenicity of hepatic parenchyma is noted suggesting hepatic steatosis. Portal vein is patent on color Doppler imaging with normal direction of blood flow towards the liver. Other: None. IMPRESSION: Probable hepatic steatosis.  Cholelithiasis without cholecystitis. Electronically Signed   By: Lynwood Landy Raddle M.D.   On: 09/28/2023 10:38   DG Chest Port 1 View Result Date: 09/27/2023 CLINICAL DATA:  10026 Shortness of breath 10026 EXAM: PORTABLE CHEST 1 VIEW COMPARISON:  Chest x-ray 06/23/2016 FINDINGS: The heart and mediastinal contours are within normal limits. No focal consolidation. No pulmonary edema. Lobulated right moderate pleural effusion. Possible trace left pleural effusion. No pneumothorax. No acute osseous abnormality. IMPRESSION: 1. Lobulated right moderate pleural effusion. 2. Possible trace left pleural effusion. Electronically Signed   By: Morgane  Naveau M.D.   On: 09/27/2023 19:19       Tomasz Steeves T. Shawntell Dixson [facility]  If 7PM-7AM, please contact night-coverage [email or web address] 09/28/2023, 1:54 PM

## 2023-09-28 NOTE — Progress Notes (Signed)
 This RN monitored the patient while he received his thoracentesis at bedside. Vital signs were documented and patient remained stable throughout procedure.

## 2023-09-28 NOTE — Progress Notes (Signed)
 OT Cancellation Note  Patient Details Name: Joshua [name] MRN: [id number] DOB: Mar 27, 1951   Cancelled Treatment:    Reason Eval/Treat Not Completed: Other (comment) Patient was approached this AM x2 with 1st attempt MD waiting to speak with patient with patient using urinal at bed level. 2nd attempt different MD approaching patient to speak with pending procedure later this afternoon. OT to check back on 09/29/23 [facility] OTR/L, MS Acute Rehabilitation Department Office# [phone]  09/28/2023, 10:02 AM

## 2023-09-28 NOTE — Progress Notes (Addendum)
 [facility] Progress Note  CC:   Melena, anemia   Subjective: Patient was transferred to progressive care yesterday evening secondary to worsening hematuria with blood clots and he had nasal congestion with oxygen saturation level down to 80%, oxygen Sentinel increased from 2 to 5 L. No CP.   He endorses feeling weak but feels better now than he did yesterday evening.  No further significant nasal congestion.  No shortness of breath.  No chest pain.  He passed 1 large black stool last night.  No bright red blood per the rectum.  He stated his urine is darker yellow and less bloody today, no further clots.  Wife and daughter at the bedside.   Objective:  Vital signs in last 24 hours: Temp:  [97.7 F (36.5 C)-99.2 F (37.3 C)] 98 F (36.7 C) (12/31 0734) Pulse Rate:  [67-77] 77 (12/31 0734) Resp:  [15-30] 22 (12/31 0734) BP: (127-156)/(53-70) 132/66 (12/31 0734) SpO2:  [80 %-100 %] 98 % (12/31 0734) Last BM Date : 09/27/23 General: Fatigued appearing 72 year old male in no acute distress, appears less pale today.  More talkative. Heart: Heart rhythm slightly irregular, no murmur. Pulm: Breath sounds clear with fine crackles to the right lower base.  No wheezes or rhonchi. Abdomen: Soft, nondistended.  Nontender.  Positive bowel sounds to all 4 quadrants.  No palpable mass.  No bruit. Extremities:  Without edema. Neurologic:  Alert and  oriented x 4. Grossly normal neurologically. Psych:  Alert and cooperative. Normal mood and affect.  Intake/Output from previous day: 12/30 0701 - 12/31 0700 In: 1917.2 [P.O.:840; I.V.:1026.7; IV Piggyback:50.5] Out: 1100 [Urine:1100] Intake/Output this shift: No intake/output data recorded.  Lab Results: Recent Labs    09/27/23 0123 09/27/23 0948 09/27/23 1814 09/28/23 0342 09/28/23 0604  WBC 4.7  --  5.4  --  5.5  HGB 9.0*   < > 8.9* 9.1* 9.1*  HCT 27.4*   < > 28.8* 29.3* 29.3*  PLT 144*  --  167  --  164   < > = values in  this interval not displayed.   BMET Recent Labs    09/26/23 0242 09/27/23 0123 09/28/23 0604  NA 135 139 136  K 4.1 4.1 3.9  CL 106 108 104  CO2 18* 23 22  GLUCOSE 92 99 121*  BUN 104* 79* 61*  CREATININE 3.35* 2.60* 2.17*  CALCIUM  8.2* 8.1* 8.2*   LFT Recent Labs    09/28/23 0604  ALBUMIN  2.4*   PT/INR Recent Labs    09/27/23 1614 09/28/23 0604  LABPROT 27.2* 16.5*  INR 2.5* 1.3*   Hepatitis Panel No results for input(s): HEPBSAG, HCVAB, HEPAIGM, HEPBIGM in the last 72 hours.  DG Chest Port 1 View Result Date: 09/27/2023 CLINICAL DATA:  10026 Shortness of breath 10026 EXAM: PORTABLE CHEST 1 VIEW COMPARISON:  Chest x-ray 06/23/2016 FINDINGS: The heart and mediastinal contours are within normal limits. No focal consolidation. No pulmonary edema. Lobulated right moderate pleural effusion. Possible trace left pleural effusion. No pneumothorax. No acute osseous abnormality. IMPRESSION: 1. Lobulated right moderate pleural effusion. 2. Possible trace left pleural effusion. Electronically Signed   By: Morgane  Naveau M.D.   On: 09/27/2023 19:19    Patient Profile: 72 year old male with a history of hypertension, DM type II, CKD stage IIIa, pancytopenia/suspected myelodysplasia, coronary artery disease s/p stent placement 2009, atrial fibrillation on Pradaxa  admitted to the hospital 09/24/2023 with progressive weakness, dizziness, shortness of breath, anemia and dark stools.  Assessment / Plan:  Symptomatic acute on chronic anemia with darks stools, suspect UGI  bleed on Pradaxa  with supratherapeutic INR.  Admission hemoglobin 7.0 (baseline Hg 13) and INR 9.5.  Transfused 1 unit of PRBCs -> Hg 7.3 -> Hg 7.0 -> transfused 1 unit of PRBCs on 12/29 -> Hg 9.5 -> Hg 9.0 -> today Hg 9.1. Iron 47. Saturation ratios 17%. Ferritin 470. B12 level 2,137. Folate 5.6. Hemodynamically stable. -EGD canceled today secondary to cancelld to acute respiratory failure/hypoxia yesterday  evening. Chest x-ray showed moderate right pleural effusion. Pulmonary consult requested.  Reschedule EGD after pulmonary clearance received. -Continue PPI IV bid -Transfuse for hemoglobin less than 8 or as needed if symptomatic -Clear liquid diet -IV fluids per the hospitalist  Acute respiratory failure with hypoxia, likely due to nasal congestion from old blood clots and atelectasis.  Oxygen increased from 2 L to 5 L nasal cannula with improvement. Oxygen weaned down to 2L [place]. Chest x-ray showed lobulated right moderate pleural effusion and possible trace left pleural effusion.  BNP 295.2. -Await pulmonary recommendations   History of pancytopenia, suspected myelodysplasia with negative bone marrow biopsy 11/20923 followed by hematologist Dr. Norleen Kidney.  Abdominal sonogram 05/2022 showed a normal liver and spleen. RUQ sono done this morning, results pending.  -Consider hematology consult during this hospitalization   Coagulopathy, Pradaxa  on hold. Admission INR 9.5 -> Received vitamin K  and Praxbind  -> INR 2.5 -> INR 3.2 on 12/29 -> transfused one unit of FFP on 12/29 -> INR 2.6 on 12/30, repeat INR 2.5 -> received 2nd dose of Praxbind  -> today INR 1.3. -Daily PT/INR     AKI on CKD with hematuria. Cr 3.35 -> 2.60 -> 2.17.  Renal ultrasound showed left renal cysts without other focal abnormality.   CAD s/p stent placement 2009. LVEF 55 - 60%   Atrial fibrillation -Continue to hold Pradaxa    Diverticulosis   History of colon polyps.  Colonoscopy in 2015 identified 1 tubular adenomatous and 1 hyperplastic polyp removed         Principal Problem:   Gastrointestinal hemorrhage with melena Active Problems:   Type 2 diabetes mellitus (HCC)   Hyperlipidemia   Essential hypertension   Coronary artery disease due to lipid rich plaque   Paroxysmal atrial fibrillation (HCC)   Pulmonary hypertension (HCC)   Symptomatic anemia   ABLA (acute blood loss anemia)   Protein-calorie  malnutrition, severe (HCC)   Thrombocytosis   Hyponatremia   AKI (acute kidney injury) (HCC)   CKD stage 3a, GFR 45-59 ml/min (HCC)     LOS: 4 days   Elida CHRISTELLA Shawl  09/28/2023, 10:22AM   Attending physician's note   I have taken history, reviewed the chart and examined the patient. I performed a substantive portion of this encounter, including complete performance of at least one of the key components, in conjunction with the APP. I agree with the Advanced Practitioner's note, impression and recommendations.   EGD canceled for today d/t acute respiratory failure/pleural effusion s/p thoracocentesis today Had few dark stools but no overt bleeding. Hb stable 9. INR 1.3 RUQ US  -mild hepatic steatosis, cholelithiasis. No cirrhosis or cholecystitis   Plan: -EGD 1/2.  Earlier if active bleeding -Trend CBC, INR -Agree to hold Pradaxa  for now. -Dr. Sybil covering after 5 PM today until 8 AM 09/30/2023.  Please call if any change in clinical status. -D/W pt, wife, daughter.   Anselm Bring, MD [facility] [phone]

## 2023-09-29 ENCOUNTER — Inpatient Hospital Stay (HOSPITAL_COMMUNITY): Payer: Medicare HMO

## 2023-09-29 DIAGNOSIS — N179 Acute kidney failure, unspecified: Secondary | ICD-10-CM | POA: Diagnosis not present

## 2023-09-29 DIAGNOSIS — I10 Essential (primary) hypertension: Secondary | ICD-10-CM | POA: Diagnosis not present

## 2023-09-29 DIAGNOSIS — E43 Unspecified severe protein-calorie malnutrition: Secondary | ICD-10-CM

## 2023-09-29 DIAGNOSIS — J9601 Acute respiratory failure with hypoxia: Secondary | ICD-10-CM | POA: Diagnosis not present

## 2023-09-29 DIAGNOSIS — D62 Acute posthemorrhagic anemia: Secondary | ICD-10-CM | POA: Diagnosis not present

## 2023-09-29 DIAGNOSIS — K921 Melena: Secondary | ICD-10-CM | POA: Diagnosis not present

## 2023-09-29 DIAGNOSIS — I272 Pulmonary hypertension, unspecified: Secondary | ICD-10-CM

## 2023-09-29 DIAGNOSIS — J942 Hemothorax: Secondary | ICD-10-CM | POA: Diagnosis not present

## 2023-09-29 LAB — RENAL FUNCTION PANEL
Albumin: 2.3 g/dL — ABNORMAL LOW (ref 3.5–5.0)
Anion gap: 7 (ref 5–15)
BUN: 51 mg/dL — ABNORMAL HIGH (ref 8–23)
CO2: 26 mmol/L (ref 22–32)
Calcium: 8.5 mg/dL — ABNORMAL LOW (ref 8.9–10.3)
Chloride: 108 mmol/L (ref 98–111)
Creatinine, Ser: 2.09 mg/dL — ABNORMAL HIGH (ref 0.61–1.24)
GFR, Estimated: 33 mL/min — ABNORMAL LOW (ref >60)
Glucose, Bld: 113 mg/dL — ABNORMAL HIGH (ref 70–99)
Phosphorus: 2 mg/dL — ABNORMAL LOW (ref 2.5–4.6)
Potassium: 4.1 mmol/L (ref 3.5–5.1)
Sodium: 141 mmol/L (ref 135–145)

## 2023-09-29 LAB — CBC
HCT: 29.7 % — ABNORMAL LOW (ref 39.0–52.0)
Hemoglobin: 9.1 g/dL — ABNORMAL LOW (ref 13.0–17.0)
MCH: 30.5 pg (ref 26.0–34.0)
MCHC: 30.6 g/dL (ref 30.0–36.0)
MCV: 99.7 fL (ref 80.0–100.0)
Platelets: 166 10*3/uL (ref 150–400)
RBC: 2.98 MIL/uL — ABNORMAL LOW (ref 4.22–5.81)
RDW: 15.9 % — ABNORMAL HIGH (ref 11.5–15.5)
WBC: 5.3 10*3/uL (ref 4.0–10.5)
nRBC: 0 % (ref 0.0–0.2)

## 2023-09-29 LAB — MAGNESIUM: Magnesium: 2.1 mg/dL (ref 1.7–2.4)

## 2023-09-29 LAB — HEMOGLOBIN AND HEMATOCRIT, BLOOD
HCT: 27.4 % — ABNORMAL LOW (ref 39.0–52.0)
Hemoglobin: 8.7 g/dL — ABNORMAL LOW (ref 13.0–17.0)

## 2023-09-29 LAB — PROTIME-INR
INR: 1.7 — ABNORMAL HIGH (ref 0.8–1.2)
Prothrombin Time: 20.1 s — ABNORMAL HIGH (ref 11.4–15.2)

## 2023-09-29 MED ORDER — SODIUM CHLORIDE 0.9% FLUSH
10.0000 mL | INTRAVENOUS | Status: DC
  Administered 2023-09-29: 10 mL via INTRAPLEURAL
  Administered 2023-09-30: 40 mL via INTRAPLEURAL
  Administered 2023-09-30 (×2): 10 mL via INTRAPLEURAL

## 2023-09-29 MED ORDER — LIDOCAINE HCL 2 % IJ SOLN
INTRAMUSCULAR | Status: AC
  Filled 2023-09-29: qty 20

## 2023-09-29 MED ORDER — KETAMINE HCL 50 MG/5ML IJ SOSY
PREFILLED_SYRINGE | INTRAMUSCULAR | Status: AC
  Administered 2023-09-29: 50 mg via INTRAVENOUS
  Filled 2023-09-29: qty 10

## 2023-09-29 MED ORDER — METHOCARBAMOL 1000 MG/10ML IJ SOLN
1000.0000 mg | Freq: Three times a day (TID) | INTRAMUSCULAR | Status: AC
  Administered 2023-09-29 – 2023-10-01 (×6): 1000 mg via INTRAVENOUS
  Filled 2023-09-29 (×9): qty 10

## 2023-09-29 MED ORDER — FENTANYL CITRATE (PF) 100 MCG/2ML IJ SOLN
INTRAMUSCULAR | Status: AC
  Administered 2023-09-29: 50 ug via INTRAVENOUS
  Administered 2023-09-29: 100 ug via INTRAVENOUS
  Filled 2023-09-29: qty 4

## 2023-09-29 MED ORDER — HYDROMORPHONE HCL 1 MG/ML IJ SOLN
0.5000 mg | INTRAMUSCULAR | Status: DC | PRN
  Administered 2023-09-30 – 2023-10-04 (×4): 0.5 mg via INTRAVENOUS
  Filled 2023-09-29 (×4): qty 1

## 2023-09-29 NOTE — Progress Notes (Signed)
 PROGRESS NOTE  Joshua [name] FMW:[id number] DOB: [birth year]   PCP: Yolande Toribio MATSU, MD  Patient is from: Home.  Lives with his wife.  Ambulates holding to furniture and walls at baseline  DOA: 09/24/2023 LOS: 5  Chief complaints Chief Complaint  Patient presents with   Weakness   Dizziness   Shortness of Breath     Brief Narrative / Interim history: 73 year old M with PMH of PAF/flutter on Pradaxa , CAD s/p stents in 2009, DM-2, CKD-3A, OSA not on CPAP, HTN, HLD and osteoarthritis presenting with progressive generalized weakness, dizziness, dyspnea, melena and edema for weeks, and admitted with symptomatic anemia in the setting of GI bleed, AKI with uremia and hyponatremia.  In ED, vitals stable. Na 128. Cr 4.45 (1.4 on 8/13).  BUN 126.  Bicarb 15.  AG 12.  Hgb 7.0.  Platelets 148.  UA with microscopic hematuria.  One units of blood ordered.  Started on IV Protonix .  GI consulted.  EGD postponed due to elevated INR. Received IV vitamin K  and Praxibind on 12/28 and FFP on 12/29.  Also received additional 1 unit of blood on 12/29.    AKI improving with IV fluid.  Renal US  without acute finding.    Hypotension resolved.  TTE without significant finding.  Had an episode of increased oxygen requirement in the evening of 12/30.  Chest x-ray showed loculated right moderate pleural effusion.  PCCM consulted.  Thora yielded 150 cc dark bloody appearing fluid.  Follow-up CT chest with large right loculated pleural effusion concerning for clotted hemothorax.  PCCM planning to place large bore chest tube.   Subjective: Seen and examined earlier this morning.  No major events overnight of this morning.  Has no complaints.  Patient's wife and daughter at bedside.  Noted some hematuria overnight but scant.  No blood clots.  Objective: Vitals:   09/28/23 1326 09/28/23 2022 09/29/23 0554 09/29/23 0802  BP: 131/63 128/69 (!) 140/70   Pulse: 73 62 73 61  Resp: 18 20 16    Temp: 97.9 F (36.6 C)  98.5 F (36.9 C) 98.4 F (36.9 C)   TempSrc: Oral Oral Oral   SpO2: 100% 97% 98%   Weight:      Height:        Examination:  GENERAL: No apparent distress.  Nontoxic. HEENT: MMM.  Vision and hearing grossly intact.  NECK: Supple.  No apparent JVD.  RESP:  No IWOB.  Diminished aeration at right lung base. CVS:  RRR. Heart sounds normal.  ABD/GI/GU: BS+. Abd soft, NTND.  MSK/EXT:  Moves extremities. No apparent deformity. No edema.  SKIN: no apparent skin lesion or wound NEURO: Awake but not quite alert.  Oriented appropriately.  No apparent focal neuro deficit. PSYCH: Calm. Normal affect.   Procedures:  12/31-right thoracocentesis with removal of 150 cc dark bloody appearing fluid  Microbiology summarized: MRSA PCR screen nonreactive  Assessment and plan: Symptomatic ABLA due to upper GIB, right hemothorax: Patient is on Pradaxa  for A-fib/flutter.  Hgb 7.0 (was 13.1 on 6/20).  Had melena for over 2 weeks.  Was symptomatic.  S/p IV vitamin K  and Praxibind on 12/28 and 12/30.  Also received FFP on 12/29.  Received 2 units of PRBC so far.  H&H stable.  Anemia panel without significant nutritional deficiency. Recent Labs    09/26/23 1710 09/27/23 0123 09/27/23 0948 09/27/23 1814 09/28/23 0342 09/28/23 0604 09/28/23 1112 09/28/23 1910 09/29/23 0353 09/29/23 0822  HGB 9.5* 9.0* 9.3* 8.9* 9.1* 9.1* 9.3*  9.5* 9.1* 8.7*  -Continue holding Pradaxa .  May consider alternative anticoagulation on discharge -Monitor H&H.  Goal Hgb > 8.0 given history of CAD.   -Continue IV Protonix  -On CLD per GI.  Plan for EGD on 1/2  Acute respiratory failure with hypoxia: Multifactorial including untreated OSA, atelectasis and right hemithorax.  Right-sided hemothorax: Likely due to Pradaxa .  He is very coagulopathic.  Coagulopathy reversed. -Desaturated to 80% on 3 L the evening of 12/30.  But improved quickly. -Appreciate help by pulmonology -S/p thoracocentesis with removal of 150 cc dark  bloody culture-negative exudative and neutrophilic fluid.   -Plan for bore chest tube -Follow-up fluid cytology. -Encourage incentive spirometry, OOB -Minimum oxygen to keep saturation above 88%  AKI on CKD-3A/uremia: Baseline Cr appears to be around 1.4.  ATN from anemia?  Takes Benicar  at home.  Denies NSAID use.  UA with hematuria.  Renal US  without significant finding.  Improved. Recent Labs    03/18/23 1026 05/11/23 1007 09/24/23 1227 09/25/23 0317 09/26/23 0242 09/27/23 0123 09/28/23 0604 09/29/23 0353  BUN 29* 24 126* 118* 104* 79* 61* 51*  CREATININE 1.34* 1.41* 4.45* 3.98* 3.35* 2.60* 2.17* 2.09*  -Continue monitoring off IV fluid -Avoid nephrotoxic meds -Manage anemia as above -Continue monitoring -Strict intake and output with intermittent bladder scan  DM-2 with CKD-3A: A1c 5.5.  CBG within normal range.  Discontinue CBG monitoring and SSI.   Essential hypertension: Soft BP with wide pulse pressure.  TTE without significant finding. -Continue holding home losartan and Imdur  -Continue home Coreg  at reduced dose   History of CAD s/p stent in 2009: No anginal symptoms. -Resume home Coreg  at reduced dose. -Continue holding losartan and Imdur .   Paroxysmal atrial fibrillation (HCC):Per chart review, patient was switched from warfarin to Pradaxa  in 2011.  May need to consider different DOAC's based on renal function.  Will discuss with his cardiologist when we get to the point. -Continue holding Pradaxa  -Continue home Coreg  at reduced dose. -Optimize electrolytes  Non-anion gap metabolic acidosis: Likely due to renal failure.  Resolved.  Hyponatremia: Likely due to renal failure.  Resolved. -Continue monitoring  Epistaxis: Chronic issue likely from Pradaxa .  Resolved.  H&H stable. -S/p  Praxibind on 12/28 and vitamin K  on 12/28 and 12/30, and FFP on 12/29 -Advised patient not to pick his nose.   Gross hematuria: Likely due to coagulopathy.   Resolving. -Discussed with urology, Dr. Watt on 12/30.  Commended to call back if worse, otherwise outpatient follow-up    Thrombocytopenia: Resolved.  Physical deconditioning -PT/OT  Hyperlipidemia -Continue rosuvastatin    Elevated INR: 9.5 on 12/28>> 1.7. -S/p Praxbind , vitamin K  and FFP.  Morbid obesity: Elevated BMI with comorbidity as above including hypertension, CAD and renal failure Body mass index is 37.21 kg/m.          DVT prophylaxis:  SCDs Start: 09/24/23 1503  Code Status: Full code Family Communication: Dated patient's wife and daughter at bedside Level of care: ICU Status is: Inpatient Remains inpatient appropriate because: Symptomatic acute blood loss anemia, respiratory failure, right hemothorax and AKI with uremia   Final disposition: Likely home once medically stable Consultants:  Gastroenterology Pulmonology  CRITICAL CARE Performed by: Mignon ONEIDA Bump   Total critical care time: 55 minutes  Critical care time was exclusive of separately billable procedures and treating other patients.  Critical care was necessary to treat or prevent imminent or life-threatening deterioration.  Critical care was time spent personally by me on the following activities: development of  treatment plan with patient and/or surrogate as well as nursing, discussions with consultants, evaluation of patient's response to treatment, examination of patient, obtaining history from patient or surrogate, ordering and performing treatments and interventions, ordering and review of laboratory studies, ordering and review of radiographic studies, pulse oximetry and re-evaluation of patient's condition.    Sch Meds:  Scheduled Meds:  carvedilol   3.125 mg Oral BID WC   Chlorhexidine  Gluconate Cloth  6 each Topical Daily   cyanocobalamin   500 mcg Oral Daily   pantoprazole  (PROTONIX ) IV  40 mg Intravenous Q12H   Continuous Infusions:    PRN Meds:.acetaminophen  **OR**  acetaminophen , ondansetron  **OR** ondansetron  (ZOFRAN ) IV, mouth rinse, sodium chloride   Antimicrobials: Anti-infectives (From admission, onward)    None        I have personally reviewed the following labs and images: CBC: Recent Labs  Lab 09/26/23 0242 09/26/23 0915 09/27/23 0123 09/27/23 0948 09/27/23 1814 09/28/23 0342 09/28/23 0604 09/28/23 1112 09/28/23 1910 09/29/23 0353 09/29/23 0822  WBC 4.5  --  4.7  --  5.4  --  5.5  --   --  5.3  --   HGB 7.0*   < > 9.0*   < > 8.9*   < > 9.1* 9.3* 9.5* 9.1* 8.7*  HCT 22.4*   < > 27.4*   < > 28.8*   < > 29.3* 29.5* 31.0* 29.7* 27.4*  MCV 98.7  --  95.8  --  98.3  --  100.7*  --   --  99.7  --   PLT 154  --  144*  --  167  --  164  --   --  166  --    < > = values in this interval not displayed.   BMP &GFR Recent Labs  Lab 09/25/23 0317 09/26/23 0242 09/27/23 0123 09/28/23 0604 09/29/23 0353  NA 131* 135 139 136 141  K 4.3 4.1 4.1 3.9 4.1  CL 105 106 108 104 108  CO2 16* 18* 23 22 26   GLUCOSE 86 92 99 121* 113*  BUN 118* 104* 79* 61* 51*  CREATININE 3.98* 3.35* 2.60* 2.17* 2.09*  CALCIUM  8.4* 8.2* 8.1* 8.2* 8.5*  MG  --  2.5* 2.3 2.3 2.1  PHOS  --  4.2 3.3 2.9 2.0*   Estimated Creatinine Clearance: 37.5 mL/min (A) (by C-G formula based on SCr of 2.09 mg/dL (H)). Liver & Pancreas: Recent Labs  Lab 09/24/23 1227 09/25/23 0317 09/26/23 0242 09/27/23 0123 09/28/23 0604 09/28/23 1112 09/29/23 0353  AST 23 20  --   --   --   --   --   ALT 12 11  --   --   --   --   --   ALKPHOS 30* 27*  --   --   --   --   --   BILITOT 1.0 1.0  --   --   --   --   --   PROT 5.7* 5.2*  --   --   --  <3.0*  --   ALBUMIN  2.3* 2.1* 2.2* 2.3* 2.4*  --  2.3*   No results for input(s): LIPASE, AMYLASE in the last 168 hours. No results for input(s): AMMONIA in the last 168 hours. Diabetic: No results for input(s): HGBA1C in the last 72 hours.  Recent Labs  Lab 09/26/23 0832 09/26/23 1203 09/26/23 1615 09/27/23 0835  09/27/23 1235  GLUCAP 87 93 101* 103* 120*   Cardiac Enzymes: Recent Labs  Lab 09/24/23 2348  CKTOTAL 122   No results for input(s): PROBNP in the last 8760 hours. Coagulation Profile: Recent Labs  Lab 09/26/23 0242 09/27/23 0735 09/27/23 1614 09/28/23 0604 09/29/23 0353  INR 3.2* 2.6* 2.5* 1.3* 1.7*   Thyroid Function Tests: No results for input(s): TSH, T4TOTAL, FREET4, T3FREE, THYROIDAB in the last 72 hours. Lipid Profile: No results for input(s): CHOL, HDL, LDLCALC, TRIG, CHOLHDL, LDLDIRECT in the last 72 hours. Anemia Panel: No results for input(s): VITAMINB12, FOLATE, FERRITIN, TIBC, IRON, RETICCTPCT in the last 72 hours.  Urine analysis:    Component Value Date/Time   COLORURINE YELLOW 09/24/2023 1735   APPEARANCEUR HAZY (A) 09/24/2023 1735   LABSPEC 1.012 09/24/2023 1735   PHURINE 5.0 09/24/2023 1735   GLUCOSEU NEGATIVE 09/24/2023 1735   GLUCOSEU NEGATIVE 09/09/2009 1651   HGBUR LARGE (A) 09/24/2023 1735   BILIRUBINUR NEGATIVE 09/24/2023 1735   KETONESUR NEGATIVE 09/24/2023 1735   PROTEINUR 100 (A) 09/24/2023 1735   UROBILINOGEN 0.2 09/09/2009 1651   NITRITE NEGATIVE 09/24/2023 1735   LEUKOCYTESUR NEGATIVE 09/24/2023 1735   Sepsis Labs: Invalid input(s): PROCALCITONIN, LACTICIDVEN  Microbiology: Recent Results (from the past 240 hours)  MRSA Next Gen by PCR, Nasal     Status: None   Collection Time: 09/24/23  4:42 PM   Specimen: Nasal Mucosa; Nasal Swab  Result Value Ref Range Status   MRSA by PCR Next Gen NOT DETECTED NOT DETECTED Final    Comment: (NOTE) The GeneXpert MRSA Assay (FDA approved for NASAL specimens only), is one component of a comprehensive MRSA colonization surveillance program. It is not intended to diagnose MRSA infection nor to guide or monitor treatment for MRSA infections. Test performance is not FDA approved in patients less than 23 years old. Performed at [facility], [address] [place] [address] [place], [place] [address]   Body fluid culture w Gram Stain     Status: None (Preliminary result)   Collection Time: 09/28/23 11:12 AM   Specimen: Pleural Fluid  Result Value Ref Range Status   Specimen Description   Final    PLEURAL Performed at [facility], [address] [place], [place] [address]    Special Requests   Final    NONE Performed at [facility], [address] [place], [place] [address]    Gram Stain NO WBC SEEN NO ORGANISMS SEEN   Final   Culture   Final    NO GROWTH < 24 HOURS Performed at [facility], [address], [place], [place] [address]    Report Status PENDING  Incomplete    Radiology Studies: No results found.      Ingri Diemer T. Jaiana Sheffer [facility]  If 7PM-7AM, please contact night-coverage [email or web address] 09/29/2023, 1:08 PM

## 2023-09-29 NOTE — Progress Notes (Signed)
 Dr. Everardo All at bedside, verbal to increase suction to -40 of patient can tolerate. If not tolerating, go down to -20 suction.

## 2023-09-29 NOTE — Plan of Care (Signed)

## 2023-09-29 NOTE — Progress Notes (Signed)
 PCCM Progress Note  Patient transferred to ICU on holiday for conscious sedation to perform large bore chest tube insertion to evacuate hemothorax. Chest tube placed with no evidence of active bleed and removal of old blood and clots. Required multiple flushes. Due to chest tube management patient will remain in stepdown unit status. Patient ok to proceed with any necessary procedures scheduled as he is hemodynamically stable and only in the unit for frequent nursing support with chest tube.   For any questions or concerns, please contact pulmonary team regarding chest tube management.

## 2023-09-29 NOTE — Consult Note (Signed)
 NAME:  Joshua [name], MRN:  [id number], DOB:  15-Jul-1951, LOS: 5 ADMISSION DATE:  09/24/2023, CONSULTATION DATE:  09/28/23 REFERRING MD:  Mignon Bump, MD, CHIEF COMPLAINT:  Pleural effusion   History of Present Illness:  73 year old male with AF/flutter on Pradaxa , CAD s/p stents in 2009, DM2, CKD IIIA, HTN, HLD, OA OSA not on CPAP with presented with weakness, dizziness, SOB and melena admitted to [facility] for symptomatic anemia secondary to GI bleed and AKI. Since hospitalization he has received IVF, PRBC x 2U, PPI, vitamin K , praxibind and FFP. Planned for EGD today or tomorrow.  Yesterday had increased O2 requirement to 5L associated with epistaxis. Seen by [facility] and improved to 2L via [place] after blowing out nasal blood clots. CXR demonstrated loculated right pleural effusion. PCCM consulted for evaluation for thoracentesis.  Has shortness of breath with movement. Improved with oxygen. Denies past history of pleural effusion or need for thoracentesis  Pertinent  Medical History  AF/flutter on Pradaxa , CAD s/p stents in 2009, DM2, CKD IIIA, HTN, HLD, OA OSA not on CPAP  Significant Hospital Events: Including procedures, antibiotic start and stop dates in addition to other pertinent events   S/p thoracentesis 09/28/23 with 150 cc blood fluid removed  Interim History / Subjective:  S/p thoracentesis 09/28/23 with 150 cc blood fluid removed  Objective   Blood pressure (!) 140/70, pulse 61, temperature 98.4 F (36.9 C), temperature source Oral, resp. rate 16, height 5' 8 (1.727 m), weight 111 kg, SpO2 98%.        Intake/Output Summary (Last 24 hours) at 09/29/2023 1229 Last data filed at 09/29/2023 0700 Gross per 24 hour  Intake --  Output 670 ml  Net -670 ml   Filed Weights   09/24/23 1114 09/24/23 1645  Weight: 83 kg 111 kg   Physical Exam: General: Well-appearing, no acute distress HENT: [place], AT Eyes: EOMI, no scleral icterus Respiratory: Diminished right lower lobe air entry.  No  crackles, wheezing or rales Cardiovascular: RRR, -M/R/G, no JVD Extremities:-Edema,-tenderness Neuro: AAO x4, CNII-XII grossly intact Psych: Normal mood, normal affect   H/H stable 8.7 BUN/Cr improving INR 1.7  Bloody fluid with WBC 2771 with N 68% L15% M14% E 3% LDH 937/935 T protein <3 Glucose <20  Resolved Hospital Problem list   N/A  Assessment & Plan:  Hemothorax in setting of recent elevated INR Acute hypoxemic respiratory failure 2/2 aspiration during epistasis, hemothorax/atelectasis Improved O2 requirement. Wean for goal >88% S/p thoracentesis 12/31. Labs consistent with bloody exudative effusion with low glucose F/u cytology CT Chest reviewed with large right loculated pleural effusion. Concerned for clotted hemothorax Discussed with family option including chest tube +/- CT surgery consult if ineffective Will coordinate large bore chest tube placement today. Discussed plan with Dr. Claudene who will perform procedure  Peri-operative Assessment of Pulmonary Risk for Non-Thoracic Surgery:  For Mr. Ramsburg, risk of perioperative pulmonary complications is increased by:  [X ]Age greater than 65 years  [ ] COPD  [ ] Serum albumin  <3.5  [ ] Smoking  [X ]Obstructive sleep apnea  [ ]  NYHA Class II Pulmonary Hypertension  ARISCAT: LOW RISK 1.6% risk of in-hospital post-op pulmonary complications (composite including respiratory failure, respiratory infection, pleural effusion, atelectasis, pneumothorax, bronchospasm, aspiration pneumonitis)  Respiratory complications generally occur in 1% of ASA Class I patients, 5% of ASA Class II and 10% of ASA Class III-IV patients These complications rarely result in mortality and include postoperative pneumonia, atelectasis, pulmonary embolism, ARDS and increased time requiring  postoperative mechanical ventilation.  Overall, I recommend proceeding with the surgery if the risk for respiratory complications are outweighed by the potential  benefits. This will need to be discussed between the patient and surgeon.  To reduce risks of respiratory complications, I recommend: --Pre- and post-operative incentive spirometry performed frequently while awake  Pulmonary will continue to follow  Best Practice (right click and Reselect all SmartList Selections daily)   Diet/type: Regular consistency (see orders) DVT prophylaxis not indicated in setting of bleed Pressure ulcer(s): pressure ulcer assessment deferred  GI prophylaxis: PPI Lines: N/A Foley:  N/A Code Status:  full code Last date of multidisciplinary goals of care discussion [ per primary]   Critical care time: N/A    Care Time: 50 min  Slater Staff, M.D. [facility] Pulmonary/Critical Care Medicine 09/29/2023 12:30 PM   See Amion for personal pager For hours between 7 PM to 7 AM, please call Elink for urgent questions

## 2023-09-29 NOTE — Evaluation (Signed)
 Occupational Therapy Evaluation Patient Details Name: Joshua [name] MRN: [id number] DOB: Nov 16, 1950 Today's Date: 09/29/2023   History of Present Illness Patient is a 73 year old male who presented with SOB, melena and dizziness. Patient was admitted with symptomatic anemia secondary to GI bleed and AKI. Patient has thoracentesis on 12/31. PMH: a fib, CKD III, HTN, HLD, OSA, OA, CPAP.   Clinical Impression   Patient is a 73 year old male who was admitted for above. Patient lives at home with wife and was independent prior level. Currently, patient needs physical A for bathing/dressing tasks with decreased functional activity tolerance. Patient was noted to have decreased functional activity tolerance, decreased endurance, decreased standing balance, decreased safety awareness, and decreased knowledge of AD/AE impacting participation in ADLs. Patient would continue to benefit from skilled OT services at this time while admitted and after d/c to address noted deficits in order to improve overall safety and independence in ADLs. Patient plans to d/c home with [facility] and family support.       If plan is discharge home, recommend the following: A little help with walking and/or transfers;A lot of help with bathing/dressing/bathroom;Assistance with cooking/housework;Direct supervision/assist for medications management;Assist for transportation;Help with stairs or ramp for entrance;Direct supervision/assist for financial management    Functional Status Assessment  Patient has had a recent decline in their functional status and demonstrates the ability to make significant improvements in function in a reasonable and predictable amount of time.  Equipment Recommendations  BSC/3in1       Precautions / Restrictions Precautions Precautions: Other (comment);Fall Precaution Comments: denies falls in past 6 months; monitor O2 Restrictions Weight Bearing Restrictions Per Provider Order: No      Mobility  Bed Mobility               General bed mobility comments: patient was in recliner upon arrival and returned to the same.          Balance Overall balance assessment: Needs assistance   Sitting balance-Leahy Scale: Good     Standing balance support: During functional activity, Single extremity supported Standing balance-Leahy Scale: Fair Standing balance comment: able to stand with no UE support to be able to adjust personal walker.         ADL either performed or assessed with clinical judgement   ADL Overall ADL's : Needs assistance/impaired Eating/Feeding: Set up;Sitting   Grooming: Sitting;Set up   Upper Body Bathing: Minimal assistance;Sitting   Lower Body Bathing: Maximal assistance;Sitting/lateral leans   Upper Body Dressing : Minimal assistance;Sitting   Lower Body Dressing: Maximal assistance;Sitting/lateral leans   Toilet Transfer: Minimal assistance Toilet Transfer Details (indicate cue type and reason): patient was CGA for sit to stand from recliner in room to adjust the walker per family request. patient declined to further participate reproting having had long morning. Toileting- Clothing Manipulation and Hygiene: Sitting/lateral lean;Total assistance                Pertinent Vitals/Pain Pain Assessment Pain Assessment: No/denies pain     Extremity/Trunk Assessment Upper Extremity Assessment Upper Extremity Assessment: Right hand dominant;Overall WFL for tasks assessed (noted to have some crepitus in shoulders but reported no pain.)   Lower Extremity Assessment Lower Extremity Assessment: Defer to PT evaluation   Cervical / Trunk Assessment Cervical / Trunk Assessment: Normal      Cognition Arousal: Alert Behavior During Therapy: Flat affect           General Comments: patient was a man of few  words with wife and daughter present during session. both providing cues when patient took longer to process movement requested.                 Home Living Family/patient expects to be discharged to:: Private residence Living Arrangements: Spouse/significant other Available Help at Discharge: Family;Available 24 hours/day Type of Home: House Home Access: Stairs to enter [employer] of Steps: 4 Entrance Stairs-Rails: None Home Layout: One level     Bathroom Shower/Tub: Producer, Television/film/video: Handicapped height     Home Equipment: Cane - single point          Prior Functioning/Environment Prior Level of Function : Independent/Modified Independent             Mobility Comments: walks with SPC at times, usually no AD, denies falls in past 6 months ADLs Comments: independent        OT Problem List: Decreased activity tolerance;Impaired balance (sitting and/or standing);Decreased coordination;Decreased safety awareness;Decreased knowledge of precautions;Decreased knowledge of use of DME or AE      OT Treatment/Interventions: Self-care/ADL training;Therapeutic exercise;DME and/or AE instruction;Therapeutic activities;Patient/family education;Balance training    OT Goals(Current goals can be found in the care plan section) Acute Rehab OT Goals Patient Stated Goal: none stated OT Goal Formulation: With patient/family Time For Goal Achievement: 10/13/23 Potential to Achieve Goals: Fair  OT Frequency: Min 1X/week       AM-PAC OT 6 Clicks Daily Activity     Outcome Measure Help from another person eating meals?: A Little Help from another person taking care of personal grooming?: A Little Help from another person toileting, which includes using toliet, bedpan, or urinal?: A Lot Help from another person bathing (including washing, rinsing, drying)?: A Lot Help from another person to put on and taking off regular upper body clothing?: A Little Help from another person to put on and taking off regular lower body clothing?: A Lot 6 Click Score: 15   End of Session Equipment  Utilized During Treatment: Gait belt;Rolling walker (2 wheels)  Activity Tolerance: Patient tolerated treatment well Patient left: in chair;with call bell/phone within reach;with family/visitor present  OT Visit Diagnosis: Unsteadiness on feet (R26.81);Muscle weakness (generalized) (M62.81)                Time: 8946-8897 OT Time Calculation (min): 9 min Charges:  OT General Charges $OT Visit: 1 Visit OT Evaluation $OT Eval Low Complexity: 1 Low  Atiba Kimberlin OTR/L, MS Acute Rehabilitation Department Office# [phone]   Geofm CHRISTELLA Dance 09/29/2023, 12:52 PM

## 2023-09-29 NOTE — Progress Notes (Signed)
 Present during Conscious Sedation:  PT preoxygenated on 15 LPM NRB, AMBU and  oral suction at bedside. Glide and supplies just outside of room.  1502 (pre)= HR 63, Sp02 100% on NRB, RR 18, C02 49.  1507 = HR 80, Sp02 100% on NRB, RR 18, Co2 49, BP 177/82.  1512= HR 65, Sp02 100% on NRB, RR 14, Co2 54, BP 166/44.  1517= HR 69, Sp02 100% on NRB, RR 21, Co2 52, BP 174/103.  1522= HR 75, Sp02 100% on NRB, RR 18, Co2 54, BP 151/111.  Vitals just before RT dismissed from room (1526)= HT 78, Sp02 100% on NRB, RR 22, Co2 55/ BP 151/111.  CCM MD dismissed RT from room at approximately 1527.

## 2023-09-29 NOTE — Procedures (Signed)
 Insertion of Chest Tube Procedure Note  Joshua [name]  [id number]  1951/02/20  Date:09/29/23  Time:3:39 PM    Provider Performing: Toribio JAYSON Sharps   Procedure: Chest Tube Insertion [id number])  Indication(s) Hemothorax  Consent Risks of the procedure as well as the alternatives and risks of each were explained to the patient and/or caregiver.  Consent for the procedure was obtained and is signed in the bedside chart  Anesthesia Topical only with 1% lidocaine   and ketamine /fent   Time Out Verified patient identification, verified procedure, site/side was marked, verified correct patient position, special equipment/implants available, medications/allergies/relevant history reviewed, required imaging and test results available.   Sterile Technique Maximal sterile technique including full sterile barrier drape, hand hygiene, sterile gown, sterile gloves, mask, hair covering, sterile ultrasound probe cover (if used).   Procedure Description Ultrasound used to identify appropriate pleural anatomy for placement and overlying skin marked. Area of placement cleaned and draped in sterile fashion.  A 32 French chest tube was placed into the right pleural space using Seldinger technique. Appropriate return of blood was obtained.  The tube was connected to atrium and placed on -20 cm H2O wall suction.   Complications/Tolerance None; patient tolerated the procedure well. Chest X-ray is ordered to verify placement.   EBL ?~200-300 old blood came out; everything was very clotted  Specimen(s) none

## 2023-09-30 ENCOUNTER — Encounter (HOSPITAL_COMMUNITY)
Admission: EM | Disposition: A | Discharge status: Rehabilitation Facility | Payer: Self-pay | Source: Ambulatory Visit | Attending: Student

## 2023-09-30 ENCOUNTER — Inpatient Hospital Stay (HOSPITAL_COMMUNITY): Payer: Medicare HMO

## 2023-09-30 ENCOUNTER — Encounter (HOSPITAL_COMMUNITY)
Admission: EM | Disposition: A | Discharge status: Other Acute Inpatient Hospital | Payer: Self-pay | Source: Ambulatory Visit | Attending: Student

## 2023-09-30 ENCOUNTER — Ambulatory Visit (HOSPITAL_COMMUNITY): Payer: Medicare HMO

## 2023-09-30 ENCOUNTER — Encounter (HOSPITAL_COMMUNITY): Payer: Self-pay

## 2023-09-30 ENCOUNTER — Encounter (HOSPITAL_COMMUNITY): Payer: Self-pay | Admitting: Internal Medicine

## 2023-09-30 DIAGNOSIS — D62 Acute posthemorrhagic anemia: Secondary | ICD-10-CM | POA: Diagnosis not present

## 2023-09-30 DIAGNOSIS — I2510 Atherosclerotic heart disease of native coronary artery without angina pectoris: Secondary | ICD-10-CM | POA: Diagnosis not present

## 2023-09-30 DIAGNOSIS — K31811 Angiodysplasia of stomach and duodenum with bleeding: Secondary | ICD-10-CM

## 2023-09-30 DIAGNOSIS — K921 Melena: Secondary | ICD-10-CM | POA: Diagnosis not present

## 2023-09-30 DIAGNOSIS — Z7901 Long term (current) use of anticoagulants: Secondary | ICD-10-CM | POA: Diagnosis not present

## 2023-09-30 DIAGNOSIS — I480 Paroxysmal atrial fibrillation: Secondary | ICD-10-CM | POA: Diagnosis not present

## 2023-09-30 DIAGNOSIS — J942 Hemothorax: Secondary | ICD-10-CM

## 2023-09-30 DIAGNOSIS — J939 Pneumothorax, unspecified: Secondary | ICD-10-CM | POA: Diagnosis not present

## 2023-09-30 DIAGNOSIS — I4891 Unspecified atrial fibrillation: Secondary | ICD-10-CM | POA: Diagnosis not present

## 2023-09-30 DIAGNOSIS — E119 Type 2 diabetes mellitus without complications: Secondary | ICD-10-CM | POA: Diagnosis not present

## 2023-09-30 HISTORY — PX: ESOPHAGOGASTRODUODENOSCOPY (EGD) WITH PROPOFOL: SHX5813

## 2023-09-30 HISTORY — PX: HOT HEMOSTASIS: SHX5433

## 2023-09-30 LAB — CBC
HCT: 29.3 % — ABNORMAL LOW (ref 39.0–52.0)
Hemoglobin: 8.7 g/dL — ABNORMAL LOW (ref 13.0–17.0)
MCH: 30 pg (ref 26.0–34.0)
MCHC: 29.7 g/dL — ABNORMAL LOW (ref 30.0–36.0)
MCV: 101 fL — ABNORMAL HIGH (ref 80.0–100.0)
Platelets: 169 10*3/uL (ref 150–400)
RBC: 2.9 MIL/uL — ABNORMAL LOW (ref 4.22–5.81)
RDW: 15.7 % — ABNORMAL HIGH (ref 11.5–15.5)
WBC: 5.1 10*3/uL (ref 4.0–10.5)
nRBC: 0 % (ref 0.0–0.2)

## 2023-09-30 LAB — CYTOLOGY - NON PAP

## 2023-09-30 LAB — RENAL FUNCTION PANEL
Albumin: 2.3 g/dL — ABNORMAL LOW (ref 3.5–5.0)
Anion gap: 7 (ref 5–15)
BUN: 45 mg/dL — ABNORMAL HIGH (ref 8–23)
CO2: 25 mmol/L (ref 22–32)
Calcium: 8.4 mg/dL — ABNORMAL LOW (ref 8.9–10.3)
Chloride: 107 mmol/L (ref 98–111)
Creatinine, Ser: 2.03 mg/dL — ABNORMAL HIGH (ref 0.61–1.24)
GFR, Estimated: 34 mL/min — ABNORMAL LOW (ref >60)
Glucose, Bld: 117 mg/dL — ABNORMAL HIGH (ref 70–99)
Phosphorus: 2.1 mg/dL — ABNORMAL LOW (ref 2.5–4.6)
Potassium: 4.1 mmol/L (ref 3.5–5.1)
Sodium: 139 mmol/L (ref 135–145)

## 2023-09-30 LAB — PROTIME-INR
INR: 1.5 — ABNORMAL HIGH (ref 0.8–1.2)
Prothrombin Time: 18 s — ABNORMAL HIGH (ref 11.4–15.2)

## 2023-09-30 LAB — MAGNESIUM: Magnesium: 2 mg/dL (ref 1.7–2.4)

## 2023-09-30 LAB — GLUCOSE, CAPILLARY: Glucose-Capillary: 88 mg/dL (ref 70–99)

## 2023-09-30 SURGERY — ESOPHAGOGASTRODUODENOSCOPY (EGD) WITH PROPOFOL
Anesthesia: Monitor Anesthesia Care

## 2023-09-30 MED ORDER — ONDANSETRON HCL 4 MG/2ML IJ SOLN
INTRAMUSCULAR | Status: DC | PRN
  Administered 2023-09-30: 4 mg via INTRAVENOUS

## 2023-09-30 MED ORDER — PROPOFOL 500 MG/50ML IV EMUL
INTRAVENOUS | Status: DC | PRN
  Administered 2023-09-30: 30 mg via INTRAVENOUS
  Administered 2023-09-30: 70 mg via INTRAVENOUS
  Administered 2023-09-30 (×2): 30 mg via INTRAVENOUS

## 2023-09-30 MED ORDER — SODIUM CHLORIDE 0.9 % IV SOLN: INTRAVENOUS | Status: DC | PRN

## 2023-09-30 MED ORDER — LIDOCAINE 2% (20 MG/ML) 5 ML SYRINGE
INTRAMUSCULAR | Status: DC | PRN
  Administered 2023-09-30: 80 mg via INTRAVENOUS

## 2023-09-30 MED ORDER — SODIUM CHLORIDE 0.9% FLUSH
40.0000 mL | Freq: Three times a day (TID) | INTRAVENOUS | Status: DC
  Administered 2023-09-30 – 2023-10-04 (×12): 40 mL via INTRAPLEURAL

## 2023-09-30 SURGICAL SUPPLY — 14 items

## 2023-09-30 NOTE — H&P (View-Only) (Signed)
 Hgb stable. Patient is NPO and going for EGD today at 1330. INR 1.5. O2 via [place] at 4L. One episode of melena this morning.

## 2023-09-30 NOTE — Progress Notes (Addendum)
 Hgb stable. Patient is NPO and going for EGD today at 1330. INR 1.5. O2 via [place] at 4L. One episode of melena this morning.

## 2023-09-30 NOTE — Op Note (Signed)
 [facility] Patient Name: Joshua [name] Procedure Date: 09/30/2023 MRN: [id number] Attending MD: Lynnie Bring , MD, [id number] Date of Birth: 1951-08-16 CSN: [id number] Age: 73 Admit Type: Inpatient Procedure:                Upper GI endoscopy Indications:              Melena Providers:                Lynnie Bring, MD, Haskel Chris, Technician,                            Hoy Penner, RN Referring MD:              Medicines:                Monitored Anesthesia Care Complications:            No immediate complications. Estimated Blood Loss:     Estimated blood loss: none. Procedure:                Pre-Anesthesia Assessment:                           - Prior to the procedure, a History and Physical                            was performed, and patient medications and                            allergies were reviewed. The patient's tolerance of                            previous anesthesia was also reviewed. The risks                            and benefits of the procedure and the sedation                            options and risks were discussed with the patient.                            All questions were answered, and informed consent                            was obtained. Prior Anticoagulants: The patient has                            taken Pradaxa  (dabigatran ), last dose was 7 days                            prior to procedure. ASA Grade Assessment: III - A                            patient with severe systemic disease. After  reviewing the risks and benefits, the patient was                            deemed in satisfactory condition to undergo the                            procedure.                           After obtaining informed consent, the endoscope was                            passed under direct vision. Throughout the                            procedure, the patient's blood pressure, pulse, and                             oxygen saturations were monitored continuously. The                            [id number]-H190 ([id number]) Olympus endoscope was introduced                            through the mouth, and advanced to the second part                            of duodenum. The upper GI endoscopy was                            accomplished without difficulty. The patient                            tolerated the procedure well. Scope In: Scope Out: Findings:      The examined esophagus was normal with well-defined Z-line at 40 cm.      Two 2 to 3 mm AVMs (one with intermittent oozing) were found in the       gastric body. Coagulation for hemostasis using argon plasma at 0.4       liters/minute and 20 watts was successful. No bleeding towards the end       of procedure.      The examined duodenum was normal. Impression:               - Two gastric AVMs (one with active intermittent                            oozing s/p APC.                           - Otherwise normal EGD.                           - No specimens collected. Moderate Sedation:      Not Applicable - Patient had care per Anesthesia. Recommendation:           -  Patient has a contact number available for                            emergencies. The signs and symptoms of potential                            delayed complications were discussed with the                            patient. Return to normal activities tomorrow.                            Written discharge instructions were provided to the                            patient.                           - Full liquid diet today. Advance to heart healthy                            diet in AM.                           - Continue Protonix  40 BID x 4 weeks, then QD                            indefinitely for now                           - If [facility] is needed, can restart 1/4                           - If continued problems esp anemia, consider colon                            followed  by [facility] at later date.                           - The findings and recommendations were discussed                            with the patient's family. Procedure Code(s):        --- Professional ---                           [phone], Esophagogastroduodenoscopy, flexible,                            transoral; with control of bleeding, any method Diagnosis Code(s):        --- Professional ---                           K31.811, Angiodysplasia of stomach and duodenum  with bleeding                           K92.1, Melena (includes Hematochezia) CPT copyright 2022 American Medical Association. All rights reserved. The codes documented in this report are preliminary and upon coder review may  be revised to meet current compliance requirements. Lynnie Bring, MD 09/30/2023 2:00:33 PM This report has been signed electronically. Number of Addenda: 0

## 2023-09-30 NOTE — Progress Notes (Addendum)
 NAME:  Joshua [name], MRN:  [id number], DOB:  03/12/1951, LOS: 6 ADMISSION DATE:  09/24/2023, CONSULTATION DATE:  09/28/23 REFERRING MD:  Mignon Bump, MD, CHIEF COMPLAINT:  Pleural effusion   History of Present Illness:  73 year old male with AF/flutter on Pradaxa , CAD s/p stents in 2009, DM2, CKD IIIA, HTN, HLD, OA OSA not on CPAP with presented with weakness, dizziness, SOB and melena admitted to [facility] for symptomatic anemia secondary to GI bleed and AKI. Since hospitalization he has received IVF, PRBC x 2U, PPI, vitamin K , praxibind and FFP. Planned for EGD today or tomorrow.  Yesterday had increased O2 requirement to 5L associated with epistaxis. Seen by [facility] and improved to 2L via [place] after blowing out nasal blood clots. CXR demonstrated loculated right pleural effusion. PCCM consulted for evaluation for thoracentesis.  Has shortness of breath with movement. Improved with oxygen. Denies past history of pleural effusion or need for thoracentesis  Pertinent  Medical History  AF/flutter on Pradaxa , CAD s/p stents in 2009, DM2, CKD IIIA, HTN, HLD, OA OSA not on CPAP  Significant Hospital Events: Including procedures, antibiotic start and stop dates in addition to other pertinent events   S/p thoracentesis 09/28/23 with 150 cc blood fluid removed 1/1 large bore   Interim History / Subjective:  Not wanting to talk about his breathing   CXR w small R apical ptx, chest tube in place. Small R effusion and r ASD   Objective   Blood pressure 134/69, pulse 77, temperature 98.1 F (36.7 C), temperature source Oral, resp. rate 16, height 5' 8 (1.727 m), weight 111 kg, SpO2 100%.        Intake/Output Summary (Last 24 hours) at 09/30/2023 1110 Last data filed at 09/30/2023 0933 Gross per 24 hour  Intake 180 ml  Output 1360 ml  Net -1180 ml   Filed Weights   09/24/23 1114 09/24/23 1645  Weight: 83 kg 111 kg   Physical Exam: General: well appearing NAD  HENT: NCAT  Respiratory: Symmetrical  chest expansion. R chest tube to -40sxn with bloody output  Cardiovascular: rr  Extremities: no acute joint deformity Neuro: AAOx4  Psych: flat affect      Resolved Hospital Problem list   N/A  Assessment & Plan:   Hemothorax Acute hypoxic resp failure 2/2 epistaxis aspiration, hemothorax, aspiration Small R apical ptx  -recent elevated INR. S/p thora 12/31 and large bore chest tube 1/1  P -cont chest tube to sxn (-40) with frequent saline flushes. Dw nursing staff   -f/u cyto  -PRN CXR -IS  -SDU status to facilitate frequency of needed care for chest tube      PCCM had also been asked to weigh in on risk stratification for GI interventions. Eval from 12/31 outlined by Dr. Kassie is as follows:  Peri-operative Assessment of Pulmonary Risk for Non-Thoracic Surgery:  For Mr. Khun, risk of perioperative pulmonary complications is increased by:  [X ]Age greater than 65 years  [ ] COPD  [ ] Serum albumin  <3.5  [ ] Smoking  [X ]Obstructive sleep apnea  [ ]  NYHA Class II Pulmonary Hypertension  ARISCAT: LOW RISK 1.6% risk of in-hospital post-op pulmonary complications (composite including respiratory failure, respiratory infection, pleural effusion, atelectasis, pneumothorax, bronchospasm, aspiration pneumonitis)  Respiratory complications generally occur in 1% of ASA Class I patients, 5% of ASA Class II and 10% of ASA Class III-IV patients These complications rarely result in mortality and include postoperative pneumonia, atelectasis, pulmonary embolism, ARDS and increased time requiring  postoperative mechanical ventilation.  Overall, I recommend proceeding with the surgery if the risk for respiratory complications are outweighed by the potential benefits. This will need to be discussed between the patient and surgeon.  To reduce risks of respiratory complications, I recommend: --Pre- and post-operative incentive spirometry performed frequently while awake  Pulmonary will  continue to follow  Best Practice (right click and Reselect all SmartList Selections daily)   Diet/type: Regular consistency (see orders) DVT prophylaxis not indicated in setting of bleed Pressure ulcer(s): pressure ulcer assessment deferred  GI prophylaxis: PPI Lines: N/A Foley:  N/A Code Status:  full code Last date of multidisciplinary goals of care discussion [ per primary]   Critical care time: N/A   Low MDM   Ronnald Gave MSN, AGACNP-BC [facility] Amion for pager  09/30/2023, 11:10 AM

## 2023-09-30 NOTE — Progress Notes (Signed)
 Patient picked up by endo staff/RN, vital signs stable.

## 2023-09-30 NOTE — Progress Notes (Signed)
 PT Cancellation Note  Patient Details Name: MERCED HANNERS MRN: [id number] DOB: 08-22-1951   Cancelled Treatment:    Reason Eval/Treat Not Completed: Medical issues which prohibited therapy, scheduled for EGD today.  Darice Potters PT Acute Rehabilitation Services Office [phone] Weekend pager-[phone]    Potters Darice Norris 09/30/2023, 10:20 AM

## 2023-09-30 NOTE — Transfer of Care (Signed)
 Immediate Anesthesia Transfer of Care Note  Patient: Joshua [name]  Procedure(s) Performed: ESOPHAGOGASTRODUODENOSCOPY (EGD) WITH PROPOFOL  HOT HEMOSTASIS (ARGON PLASMA COAGULATION/BICAP)  Patient Location: PACU  Anesthesia Type:MAC  Level of Consciousness: drowsy  Airway & Oxygen Therapy: Patient Spontanous Breathing and Patient connected to nasal cannula oxygen  Post-op Assessment: Report given to RN and Post -op Vital signs reviewed and stable  Post vital signs: Reviewed and stable  Last Vitals:  Vitals Value Taken Time  BP    Temp    Pulse 83 09/30/23 1357  Resp 12 09/30/23 1357  SpO2 100 % 09/30/23 1357  Vitals shown include unfiled device data.  Last Pain:  Vitals:   09/30/23 1243  TempSrc: Temporal  PainSc: 0-No pain         Complications: No notable events documented.

## 2023-09-30 NOTE — Interval H&P Note (Signed)
 History and Physical Interval Note:  09/30/2023 1:23 PM  Joshua [name]  has presented today for surgery, with the diagnosis of Melena.  The various methods of treatment have been discussed with the patient and family. After consideration of risks, benefits and other options for treatment, the patient has consented to  Procedure(s): ESOPHAGOGASTRODUODENOSCOPY (EGD) WITH PROPOFOL  (N/A) as a surgical intervention.  The patient's history has been reviewed, patient examined, no change in status, stable for surgery.  I have reviewed the patient's chart and labs.  Questions were answered to the patient's satisfaction.     Lynnie Bring

## 2023-09-30 NOTE — Anesthesia Postprocedure Evaluation (Signed)
 Anesthesia Post Note  Patient: Joshua [name]  Procedure(s) Performed: ESOPHAGOGASTRODUODENOSCOPY (EGD) WITH PROPOFOL  HOT HEMOSTASIS (ARGON PLASMA COAGULATION/BICAP)     Patient location during evaluation: Endoscopy Anesthesia Type: MAC Level of consciousness: awake and alert, patient cooperative and oriented Pain management: pain level controlled Vital Signs Assessment: post-procedure vital signs reviewed and stable Respiratory status: spontaneous breathing, nonlabored ventilation, respiratory function stable and patient connected to nasal cannula oxygen Cardiovascular status: stable and blood pressure returned to baseline Postop Assessment: no apparent nausea or vomiting Anesthetic complications: no   No notable events documented.  Last Vitals:  Vitals:   09/30/23 1430 09/30/23 1458  BP: 126/64 136/68  Pulse: 80 74  Resp: 19 18  Temp:    SpO2: 97% 99%    Last Pain:  Vitals:   09/30/23 1458  TempSrc:   PainSc: 0-No pain                 Tucker Minter,E. Mazen Marcin

## 2023-09-30 NOTE — Anesthesia Preprocedure Evaluation (Addendum)
 Anesthesia Evaluation  Patient identified by MRN, date of birth, ID band Patient awake    Reviewed: Allergy & Precautions, NPO status , Patient's Chart, lab work & pertinent test results, reviewed documented beta blocker date and time   History of Anesthesia Complications Negative for: history of anesthetic complications  Airway Mallampati: II  TM Distance: >3 FB Neck ROM: Full    Dental  (+) Dental Advisory Given   Pulmonary neg pulmonary ROS   breath sounds clear to auscultation       Cardiovascular hypertension, Pt. on medications and Pt. on home beta blockers (-) angina + CAD, + Past MI and + Cardiac Stents  + dysrhythmias Atrial Fibrillation  Rhythm:Irregular Rate:Normal  08/2023 ECHO: EF 55-60%, normal LVF, mildly reduced RVF, aortic valve sclerosis without stenosis   Neuro/Psych negative neurological ROS     GI/Hepatic Neg liver ROS,,,melena   Endo/Other  diabetes (glu 117), Oral Hypoglycemic Agents  BMI 37  Renal/GU Renal InsufficiencyRenal disease     Musculoskeletal  (+) Arthritis ,    Abdominal   Peds  Hematology  (+) Blood dyscrasia (Hb 8.7, plt 169k) pradaxa    Anesthesia Other Findings   Reproductive/Obstetrics                             Anesthesia Physical Anesthesia Plan  ASA: 3  Anesthesia Plan: MAC   Post-op Pain Management: Minimal or no pain anticipated   Induction:   PONV Risk Score and Plan: 1 and Treatment may vary due to age or medical condition  Airway Management Planned: Natural Airway and Nasal Cannula  Additional Equipment: None  Intra-op Plan:   Post-operative Plan:   Informed Consent: I have reviewed the patients History and Physical, chart, labs and discussed the procedure including the risks, benefits and alternatives for the proposed anesthesia with the patient or authorized representative who has indicated his/her understanding and  acceptance.     Dental advisory given  Plan Discussed with: CRNA and Surgeon  Anesthesia Plan Comments:         Anesthesia Quick Evaluation

## 2023-09-30 NOTE — Progress Notes (Signed)
 PROGRESS NOTE  Joshua [name] FMW:[id number] DOB: [birth year]   PCP: Yolande Toribio MATSU, MD  Patient is from: Home.  Lives with his wife.  Ambulates holding to furniture and walls at baseline  DOA: 09/24/2023 LOS: 6  Chief complaints Chief Complaint  Patient presents with   Weakness   Dizziness   Shortness of Breath     Brief Narrative / Interim history: 73 year old M with PMH of PAF/flutter on Pradaxa , CAD s/p stents in 2009, DM-2, CKD-3A, OSA not on CPAP, HTN, HLD and osteoarthritis presenting with progressive generalized weakness, dizziness, dyspnea, melena and edema for weeks, and admitted with symptomatic anemia in the setting of GI bleed, AKI with uremia and hyponatremia.  In ED, vitals stable. Na 128. Cr 4.45 (1.4 on 8/13).  BUN 126.  Bicarb 15.  AG 12.  Hgb 7.0.  Platelets 148.  UA with microscopic hematuria.  One units of blood ordered.  Started on IV Protonix .  GI consulted.  EGD delayed due to elevated INR. IV vitamin K  and Praxibind on 12/28 and 12/30.  Also received FFP on 12/29.  Received 2 units of PRBC so far. EGD on 1/1 with 2 gastric AVMs, 1 with intermittent oozing and treated with APC.  GI recommended p.o. Protonix  40 mg twice daily for 4 weeks followed by 40 mg daily indefinitely.   Patient had an episode of increased oxygen requirement in the evening of 12/30.  Chest x-ray showed loculated right moderate pleural effusion.  PCCM consulted.  Thora yielded 150 cc dark bloody appearing fluid.  Follow-up CT chest with large right loculated pleural effusion concerning for clotted hemothorax.  PCCM placed large bore chest tube on 01/01.   Hypotension resolved.  TTE without significant finding. AKI improving with IV fluid.  Renal US  without acute finding.    Subjective:   Objective: Vitals:   09/30/23 1100 09/30/23 1200 09/30/23 1243 09/30/23 1356  BP: (!) 131/56 (!) 137/59 (!) 151/72   Pulse: 65 64 72 70  Resp: 18 19 17  (!) 28  Temp:   97.7 F (36.5 C)   TempSrc:    Temporal   SpO2: 99% 98% 100% 99%  Weight:      Height:        Examination:  GENERAL: No apparent distress.  Nontoxic. HEENT: MMM.  Vision and hearing grossly intact.  NECK: Supple.  No apparent JVD.  RESP:  No IWOB.  Diminished aeration at right lung base.  Right chest tube with dark bloody output CVS: Irregular rhythm.  Normal rate.  Heart sounds normal.  ABD/GI/GU: BS+. Abd soft, NTND.  MSK/EXT:  Moves extremities. No apparent deformity.  1+ BLE edema. SKIN: Skin changes consistent with venous insufficiency in BLE NEURO: Awake but not quite alert.  Oriented appropriately.  No apparent focal neuro deficit. PSYCH: Calm. Normal affect.   Procedures:  12/31-right thoracocentesis with removal of 150 cc dark bloody appearing fluid 1/1-right chest tube by pulmonology 1/2-EGD with 2 gastric AVMs, 1 of which oozing and treated with APC's  Microbiology summarized: MRSA PCR screen nonreactive  Assessment and plan: Symptomatic ABLA due to upper GIB, right hemothorax: Patient is on Pradaxa  for A-fib/flutter.  Hgb 7.0 (was 13.1 on 6/20).  Had melena for over 2 weeks.  Was symptomatic.  S/p IV vitamin K  and Praxibind on 12/28 and 12/30.  Also received FFP on 12/29.  Received 2 units of PRBC so far.  H&H stable.  Anemia panel without significant nutritional deficiency.  EGD as above. Recent Labs  09/27/23 0123 09/27/23 0948 09/27/23 1814 09/28/23 0342 09/28/23 0604 09/28/23 1112 09/28/23 1910 09/29/23 0353 09/29/23 0822 09/30/23 0316  HGB 9.0* 9.3* 8.9* 9.1* 9.1* 9.3* 9.5* 9.1* 8.7* 8.7*  -Continue holding Pradaxa .  May consider reduced dose of Eliquis  on discharge if okay with GI. -Monitor H&H.  Goal Hgb > 8.0 given history of CAD.   -Appreciate GI recommendations -P.O. Protonix  40 mg twice daily for 4 weeks followed by 40 mg daily indefinitely -Full liquid diet today, and heart healthy diet on 1/3  Acute respiratory failure with hypoxia: Due to OSA, atelectasis, hemothorax  and nasal blockage from dry blood Right-sided hemothorax: Likely due to Pradaxa .  He is very coagulopathic.  Coagulopathy reversed. -Desaturated to 80% on 3 L the evening of 12/30.  But improved quickly. -Appreciate help by pulmonology -S/p thoracocentesis with removal of 150 cc dark bloody culture-negative exudative and neutrophilic fluid.   -s/p  right chest tube on 1/1. -Follow-up fluid cytology. -Encourage incentive spirometry, OOB -Minimum oxygen to keep saturation above 88%  AKI on CKD-3A/uremia: Cr 1.4 in 04/2023.  ATN from anemia?  Takes Benicar  at home.  Denies NSAID use.  UA with hematuria.  Renal US  without significant finding.  Improved to 2.0 which is likely his new baseline. Recent Labs    03/18/23 1026 05/11/23 1007 09/24/23 1227 09/25/23 0317 09/26/23 0242 09/27/23 0123 09/28/23 0604 09/29/23 0353 09/30/23 0316  BUN 29* 24 126* 118* 104* 79* 61* 51* 45*  CREATININE 1.34* 1.41* 4.45* 3.98* 3.35* 2.60* 2.17* 2.09* 2.03*  -Continue monitoring off IV fluid -Avoid nephrotoxic meds -Manage anemia as above -Continue monitoring -Strict intake and output with intermittent bladder scan  DM-2 with CKD-3A: A1c 5.5%.  CBG within normal range.  Discontinued CBG monitoring and SSI.   Essential hypertension: Soft BP with wide pulse pressure.  TTE without significant finding. -Continue holding home losartan and Imdur  -Continue home Coreg  at reduced dose   History of CAD s/p stent in 2009: No anginal symptoms. -Resume home Coreg  at reduced dose. -Continue holding losartan and Imdur .   PAF: In A-fib but rate controlled.  Per chart review, patient was switched from warfarin to Pradaxa  in 2011.   -Continue holding Pradaxa .  May use reduced dose of Eliquis  for anticoagulation if okay from GI standpoint -Continue home Coreg  at reduced dose. -Optimize electrolytes  Non-anion gap metabolic acidosis: Likely due to renal failure.  Resolved.  Hyponatremia: Likely due to renal  failure.  Resolved. -Continue monitoring  Epistaxis: Chronic issue likely from Pradaxa .  Resolved.  H&H stable. -S/p  Praxibind on 12/28 and vitamin K  on 12/28 and 12/30, and FFP on 12/29 -Advised patient not to pick his nose.  -Saline nose spray  Gross hematuria: Likely due to coagulopathy.  Resolving. -Discussed with urology, Dr. Watt on 12/30.  Commended to call back if worse, otherwise outpatient follow-up    Thrombocytopenia: Resolved.  Physical deconditioning -PT/OT-recommended HH PT/OT.  Hyperlipidemia -Continue rosuvastatin    Elevated INR: 9.5 on 12/28>> 1.5 -S/p Praxbind , vitamin K  and FFP.  Morbid obesity: Elevated BMI with comorbidity as above including hypertension, CAD and renal failure Body mass index is 37.21 kg/m.          DVT prophylaxis:  SCDs Start: 09/24/23 1503  Code Status: Full code Family Communication: Dated patient's wife and daughter at bedside Level of care: Stepdown Status is: Inpatient Remains inpatient appropriate because: Symptomatic acute blood loss anemia, respiratory failure, right hemothorax and AKI with uremia   Final disposition: Likely home  once medically stable Consultants:  Gastroenterology Pulmonology  CRITICAL CARE Performed by: Mignon ONEIDA Bump   Total critical care time: 55 minutes  Critical care time was exclusive of separately billable procedures and treating other patients.  Critical care was necessary to treat or prevent imminent or life-threatening deterioration.  Critical care was time spent personally by me on the following activities: development of treatment plan with patient and/or surrogate as well as nursing, discussions with consultants, evaluation of patient's response to treatment, examination of patient, obtaining history from patient or surrogate, ordering and performing treatments and interventions, ordering and review of laboratory studies, ordering and review of radiographic studies, pulse oximetry  and re-evaluation of patient's condition.    Sch Meds:  Scheduled Meds:  [MAR Hold] carvedilol   3.125 mg Oral BID WC   [MAR Hold] Chlorhexidine  Gluconate Cloth  6 each Topical Daily   [MAR Hold] cyanocobalamin   500 mcg Oral Daily   [MAR Hold] methocarbamol  (ROBAXIN ) injection  1,000 mg Intravenous Q8H   [MAR Hold] pantoprazole  (PROTONIX ) IV  40 mg Intravenous Q12H   [MAR Hold] sodium chloride  flush  10 mL Intrapleural Q4H   sodium chloride  flush  40 mL Intrapleural Q8H   Continuous Infusions:    PRN Meds:.[MAR Hold] acetaminophen  **OR** [MAR Hold] acetaminophen , [MAR Hold]  HYDROmorphone  (DILAUDID ) injection, [MAR Hold] ondansetron  **OR** [MAR Hold] ondansetron  (ZOFRAN ) IV, [MAR Hold] mouth rinse, [MAR Hold] sodium chloride   Antimicrobials: Anti-infectives (From admission, onward)    None        I have personally reviewed the following labs and images: CBC: Recent Labs  Lab 09/27/23 0123 09/27/23 0948 09/27/23 1814 09/28/23 0342 09/28/23 0604 09/28/23 1112 09/28/23 1910 09/29/23 0353 09/29/23 0822 09/30/23 0316  WBC 4.7  --  5.4  --  5.5  --   --  5.3  --  5.1  HGB 9.0*   < > 8.9*   < > 9.1* 9.3* 9.5* 9.1* 8.7* 8.7*  HCT 27.4*   < > 28.8*   < > 29.3* 29.5* 31.0* 29.7* 27.4* 29.3*  MCV 95.8  --  98.3  --  100.7*  --   --  99.7  --  101.0*  PLT 144*  --  167  --  164  --   --  166  --  169   < > = values in this interval not displayed.   BMP &GFR Recent Labs  Lab 09/26/23 0242 09/27/23 0123 09/28/23 0604 09/29/23 0353 09/30/23 0316  NA 135 139 136 141 139  K 4.1 4.1 3.9 4.1 4.1  CL 106 108 104 108 107  CO2 18* 23 22 26 25   GLUCOSE 92 99 121* 113* 117*  BUN 104* 79* 61* 51* 45*  CREATININE 3.35* 2.60* 2.17* 2.09* 2.03*  CALCIUM  8.2* 8.1* 8.2* 8.5* 8.4*  MG 2.5* 2.3 2.3 2.1 2.0  PHOS 4.2 3.3 2.9 2.0* 2.1*   Estimated Creatinine Clearance: 38.6 mL/min (A) (by C-G formula based on SCr of 2.03 mg/dL (H)). Liver & Pancreas: Recent Labs  Lab  09/24/23 1227 09/25/23 0317 09/26/23 0242 09/27/23 0123 09/28/23 0604 09/28/23 1112 09/29/23 0353 09/30/23 0316  AST 23 20  --   --   --   --   --   --   ALT 12 11  --   --   --   --   --   --   ALKPHOS 30* 27*  --   --   --   --   --   --  BILITOT 1.0 1.0  --   --   --   --   --   --   PROT 5.7* 5.2*  --   --   --  <3.0*  --   --   ALBUMIN  2.3* 2.1* 2.2* 2.3* 2.4*  --  2.3* 2.3*   No results for input(s): LIPASE, AMYLASE in the last 168 hours. No results for input(s): AMMONIA in the last 168 hours. Diabetic: No results for input(s): HGBA1C in the last 72 hours.  Recent Labs  Lab 09/26/23 0832 09/26/23 1203 09/26/23 1615 09/27/23 0835 09/27/23 1235  GLUCAP 87 93 101* 103* 120*   Cardiac Enzymes: Recent Labs  Lab 09/24/23 2348  CKTOTAL 122   No results for input(s): PROBNP in the last 8760 hours. Coagulation Profile: Recent Labs  Lab 09/27/23 0735 09/27/23 1614 09/28/23 0604 09/29/23 0353 09/30/23 0316  INR 2.6* 2.5* 1.3* 1.7* 1.5*   Thyroid Function Tests: No results for input(s): TSH, T4TOTAL, FREET4, T3FREE, THYROIDAB in the last 72 hours. Lipid Profile: No results for input(s): CHOL, HDL, LDLCALC, TRIG, CHOLHDL, LDLDIRECT in the last 72 hours. Anemia Panel: No results for input(s): VITAMINB12, FOLATE, FERRITIN, TIBC, IRON, RETICCTPCT in the last 72 hours.  Urine analysis:    Component Value Date/Time   COLORURINE YELLOW 09/24/2023 1735   APPEARANCEUR HAZY (A) 09/24/2023 1735   LABSPEC 1.012 09/24/2023 1735   PHURINE 5.0 09/24/2023 1735   GLUCOSEU NEGATIVE 09/24/2023 1735   GLUCOSEU NEGATIVE 09/09/2009 1651   HGBUR LARGE (A) 09/24/2023 1735   BILIRUBINUR NEGATIVE 09/24/2023 1735   KETONESUR NEGATIVE 09/24/2023 1735   PROTEINUR 100 (A) 09/24/2023 1735   UROBILINOGEN 0.2 09/09/2009 1651   NITRITE NEGATIVE 09/24/2023 1735   LEUKOCYTESUR NEGATIVE 09/24/2023 1735   Sepsis Labs: Invalid input(s):  PROCALCITONIN, LACTICIDVEN  Microbiology: Recent Results (from the past 240 hours)  MRSA Next Gen by PCR, Nasal     Status: None   Collection Time: 09/24/23  4:42 PM   Specimen: Nasal Mucosa; Nasal Swab  Result Value Ref Range Status   MRSA by PCR Next Gen NOT DETECTED NOT DETECTED Final    Comment: (NOTE) The GeneXpert MRSA Assay (FDA approved for NASAL specimens only), is one component of a comprehensive MRSA colonization surveillance program. It is not intended to diagnose MRSA infection nor to guide or monitor treatment for MRSA infections. Test performance is not FDA approved in patients less than 79 years old. Performed at [facility], [address] [place], [place] [address]   Body fluid culture w Gram Stain     Status: None (Preliminary result)   Collection Time: 09/28/23 11:12 AM   Specimen: Pleural Fluid  Result Value Ref Range Status   Specimen Description   Final    PLEURAL Performed at [facility], [address], [place], [place] [address]    Special Requests   Final    NONE Performed at [facility], [address], [place], [place] [address]    Gram Stain NO WBC SEEN NO ORGANISMS SEEN   Final   Culture   Final    NO GROWTH 2 DAYS Performed at [facility], [address], [place], [place] [address]    Report Status PENDING  Incomplete    Radiology Studies: DG Chest 1 View Result Date: 09/30/2023 CLINICAL DATA:  Chest tube in place. EXAM: CHEST  1 VIEW COMPARISON:  Radiographs 09/29/2023 and 09/28/2023.  CT 09/29/2023. FINDINGS: 0501 hours. Right  chest tube appears unchanged in position. Small residual right pleural effusion appears unchanged. There is a small right apical pneumothorax with residual right basilar airspace disease. Right chest wall soft tissue emphysema appears mildly improved. The left lung is clear. The heart size and mediastinal contours are stable. IMPRESSION: 1. Small  right apical pneumothorax with right chest tube in place. 2. Unchanged small right pleural effusion and right basilar airspace disease. Electronically Signed   By: Elsie Perone M.D.   On: 09/30/2023 09:03   DG Chest 1 View Result Date: 09/29/2023 CLINICAL DATA:  Right pleural effusion, right chest tube in place EXAM: CHEST  1 VIEW COMPARISON:  09/29/2023 FINDINGS: Two frontal views of the chest demonstrate right chest tube, tip and side port overlying the right lower hemithorax. Persistent but decreased partially loculated right pleural effusion. Continue consolidation at the right lung base. Left chest is clear. No pneumothorax. Stable enlargement of the cardiac silhouette. Subcutaneous gas within the right chest wall consistent with chest tube insertion. IMPRESSION: 1. Partially loculated right pleural effusion, slightly decreased after interval right chest tube placement. 2. Right basilar consolidation, favor atelectasis. 3. Stable enlargement of the cardiac silhouette. Electronically Signed   By: Ozell Daring M.D.   On: 09/29/2023 16:44        Faatima Tench T. Malarie Tappen [facility]  If 7PM-7AM, please contact night-coverage [email or web address] 09/30/2023, 2:02 PM

## 2023-10-01 ENCOUNTER — Inpatient Hospital Stay (HOSPITAL_COMMUNITY): Payer: Medicare HMO

## 2023-10-01 ENCOUNTER — Encounter (HOSPITAL_COMMUNITY): Payer: Self-pay | Admitting: Gastroenterology

## 2023-10-01 DIAGNOSIS — J942 Hemothorax: Secondary | ICD-10-CM

## 2023-10-01 DIAGNOSIS — K922 Gastrointestinal hemorrhage, unspecified: Secondary | ICD-10-CM

## 2023-10-01 DIAGNOSIS — J939 Pneumothorax, unspecified: Secondary | ICD-10-CM | POA: Diagnosis not present

## 2023-10-01 DIAGNOSIS — K921 Melena: Secondary | ICD-10-CM | POA: Diagnosis not present

## 2023-10-01 DIAGNOSIS — D649 Anemia, unspecified: Secondary | ICD-10-CM | POA: Diagnosis not present

## 2023-10-01 DIAGNOSIS — D62 Acute posthemorrhagic anemia: Secondary | ICD-10-CM | POA: Diagnosis not present

## 2023-10-01 DIAGNOSIS — K31811 Angiodysplasia of stomach and duodenum with bleeding: Secondary | ICD-10-CM | POA: Diagnosis not present

## 2023-10-01 DIAGNOSIS — N1831 Chronic kidney disease, stage 3a: Secondary | ICD-10-CM | POA: Diagnosis not present

## 2023-10-01 LAB — COMPREHENSIVE METABOLIC PANEL
ALT: 11 U/L (ref 0–44)
AST: 22 U/L (ref 15–41)
Albumin: 2.1 g/dL — ABNORMAL LOW (ref 3.5–5.0)
Alkaline Phosphatase: 32 U/L — ABNORMAL LOW (ref 38–126)
Anion gap: 9 (ref 5–15)
BUN: 37 mg/dL — ABNORMAL HIGH (ref 8–23)
CO2: 25 mmol/L (ref 22–32)
Calcium: 8.3 mg/dL — ABNORMAL LOW (ref 8.9–10.3)
Chloride: 108 mmol/L (ref 98–111)
Creatinine, Ser: 1.75 mg/dL — ABNORMAL HIGH (ref 0.61–1.24)
GFR, Estimated: 40 mL/min — ABNORMAL LOW (ref >60)
Glucose, Bld: 98 mg/dL (ref 70–99)
Potassium: 3.9 mmol/L (ref 3.5–5.1)
Sodium: 142 mmol/L (ref 135–145)
Total Bilirubin: 0.8 mg/dL (ref 0.0–1.2)
Total Protein: 5.7 g/dL — ABNORMAL LOW (ref 6.5–8.1)

## 2023-10-01 LAB — BODY FLUID CULTURE W GRAM STAIN
Culture: NO GROWTH
Gram Stain: NONE SEEN

## 2023-10-01 LAB — CBC
HCT: 30.9 % — ABNORMAL LOW (ref 39.0–52.0)
Hemoglobin: 9.5 g/dL — ABNORMAL LOW (ref 13.0–17.0)
MCH: 31 pg (ref 26.0–34.0)
MCHC: 30.7 g/dL (ref 30.0–36.0)
MCV: 101 fL — ABNORMAL HIGH (ref 80.0–100.0)
Platelets: 151 10*3/uL (ref 150–400)
RBC: 3.06 MIL/uL — ABNORMAL LOW (ref 4.22–5.81)
RDW: 15.7 % — ABNORMAL HIGH (ref 11.5–15.5)
WBC: 5.7 10*3/uL (ref 4.0–10.5)
nRBC: 0 % (ref 0.0–0.2)

## 2023-10-01 LAB — MAGNESIUM: Magnesium: 2 mg/dL (ref 1.7–2.4)

## 2023-10-01 MED ORDER — MELATONIN 3 MG PO TABS
3.0000 mg | ORAL_TABLET | Freq: Every day | ORAL | Status: DC
  Administered 2023-10-01: 3 mg via ORAL
  Filled 2023-10-01: qty 1

## 2023-10-01 MED ORDER — TAMSULOSIN HCL 0.4 MG PO CAPS
0.4000 mg | ORAL_CAPSULE | Freq: Every day | ORAL | Status: DC
Start: 2023-10-01 — End: 2023-10-24
  Administered 2023-10-01 – 2023-10-24 (×23): 0.4 mg via ORAL
  Filled 2023-10-01 (×24): qty 1

## 2023-10-01 MED ORDER — HYDROCORTISONE 1 % EX CREA
TOPICAL_CREAM | CUTANEOUS | Status: DC | PRN
  Filled 2023-10-01: qty 28

## 2023-10-01 NOTE — Plan of Care (Signed)
   Problem: Clinical Measurements: Goal: Respiratory complications will improve Outcome: Progressing Goal: Cardiovascular complication will be avoided Outcome: Progressing   Problem: Elimination: Goal: Will not experience complications related to urinary retention Outcome: Progressing

## 2023-10-01 NOTE — Progress Notes (Signed)
 [facility]                                                                              Joshua [name], is a 73 y.o. male, DOB - [birth year], FMW:[id number] Admit date - 09/24/2023    Outpatient Primary MD for the patient is Joshua Toribio MATSU, MD  LOS - 7  days  Chief Complaint  Patient presents with   Weakness   Dizziness   Shortness of Breath       Brief summary   Patient is a 73 year old M with PMH of PAF/flutter on Pradaxa , CAD s/p stents in 2009, DM-2, CKD-3A, OSA not on CPAP, HTN, HLD and osteoarthritis presenting with progressive generalized weakness, dizziness, dyspnea, melena and edema for weeks, and admitted with symptomatic anemia in the setting of GI bleed, AKI with uremia and hyponatremia.   In ED, vitals stable. Na 128. Cr 4.45 (1.4 on 8/13).  BUN 126.  Bicarb 15.  AG 12.  Hgb 7.0.  Platelets 148.  UA with microscopic hematuria.  One units of blood ordered.  Started on IV Protonix .  GI consulted.   EGD delayed due to elevated INR. IV vitamin K  and Praxibind on 12/28 and 12/30.  Also received FFP on 12/29.  Received 2 units of PRBC so far. EGD on 1/1 with 2 gastric AVMs, 1 with intermittent oozing and treated with APC.  GI recommended p.o. Protonix  40 mg twice daily for 4 weeks followed by 40 mg daily indefinitely.    Patient had an episode of increased oxygen requirement in the evening of 12/30.  Chest x-ray showed loculated right moderate pleural effusion.  PCCM consulted.  Thoracentesis yielded 150 cc dark bloody appearing fluid.  Follow-up CT chest with large right loculated pleural effusion concerning for clotted hemothorax.  PCCM placed large bore chest tube on 01/01.  TTE without significant finding. AKI improving with IV fluid.  Renal US  without acute finding.     Assessment & Plan    Principal Problem: Symptomatic ABLA due to upper GIB, right hemothorax: -Patient was on Pradaxa  for A-fib/flutter. Hgb 7.0 (was 13.1 on 6/20).  Had melena for over  2 weeks.  Was symptomatic.  S/p IV vitamin K  and Praxibind on 12/28 and 12/30.  Also received FFP on 12/29.  Received 2 units of PRBC. -  EGD on 1/1 with 2 gastric AVMs, 1 with intermittent oozing and treated with APC.  GI recommended p.o. Protonix  40 mg twice daily for 4 weeks followed by 40 mg daily indefinitely.  -Continue to hold Pradaxa  -Patient follows cardiology, Dr. Waddell, will need GI recommendations regarding anticoagulation once right-sided hemothorax is resolved. -H&H stable, 9.5     Acute respiratory failure with hypoxia:  - Due to OSA, atelectasis, hemothorax and nasal blockage from dry blood -Stable, O2 sats 94% on 2 L   Right-sided hemothorax: - Likely due to Pradaxa , INR supratherapeutic 9.5 on 12/27, reversed with FFP, Praxbind  --S/p thoracocentesis with removal of 150 cc dark bloody culture-negative exudative and neutrophilic fluid. -s/p right chest tube on 1/1.  Management per CCM    AKI on CKD-3A/uremia:  - Cr 1.4 in 04/2023.  ATN from anemia, on Benicar  at home.  Denies NSAID use.  UA with hematuria. -   Renal US  without significant finding. -Creatinine improving, 1.75 today     Diabetes mellitus type 2, NIDDM with CKD-3A:  -A1c 5.5%.  CBG within normal range.     Essential hypertension:  -BP is stable, 2D echo 12/28 showed EF of 55 to 60%, no regional WMA  -Continue holding losartan and Imdur  -Continue home Coreg  at reduced dose   History of CAD s/p stent in 2009: No anginal symptoms. -Resumed home Coreg  at reduced dose. -Losartan, Imdur  on hold   Paroxysmal atrial fibrillation -  Per chart review, patient was switched from warfarin to Pradaxa  in 2011.   -Rate controlled, continue Coreg , in A-fib -Continue to hold Pradaxa .  Once cleared from GI standpoint and chest tube removed, will need cardiology recommendations regarding anticoagulation.  Patient follows Dr. Waddell.    Non-anion gap metabolic acidosis: - Likely due to renal failure.  Resolved.    Hyponatremia:  - Likely due to renal failure.  Resolved.   Epistaxis: Chronic issue likely from Pradaxa . - Resolved.  -S/p  Praxibind on 12/28 and vitamin K  on 12/28 and 12/30, and FFP on 12/29 -H&H stable   Gross hematuria: Likely due to coagulopathy.  Resolving. -Dr Joshua [name] discussed with urology, Dr. Watt on 12/30, was recommended outpatient follow-up.    Sundowning, insomnia -Per daughter, patient is having agitation at night and not sleeping.   Thrombocytopenia:  -Resolved.   Physical deconditioning -PT/OT-recommended HH PT/OT.   Hyperlipidemia -Continue rosuvastatin     Elevated INR: 9.5 on 12/28>> 1.5 -S/p Praxbind , vitamin K  and FFP.   Morbid obesity:  Estimated body mass index is 37.21 kg/m as calculated from the following:   Height as of this encounter: 5' 8 (1.727 m).   Weight as of this encounter: 111 kg.  Code Status: Full code DVT Prophylaxis:  SCDs Start: 09/24/23 1503   Level of Care: Level of care: Stepdown Family Communication: Updated patient's wife and daughter at the bedside Disposition Plan:      Remains inpatient appropriate:      Procedures:  2D echo 12/31-right thoracocentesis with removal of 150 cc dark bloody appearing fluid 1/1-right chest tube by pulmonology 1/2-EGD with 2 gastric AVMs, 1 of which oozing and treated with APC's  Consultants:   PCCM GI  Antimicrobials:   Anti-infectives (From admission, onward)    None          Medications  carvedilol   3.125 mg Oral BID WC   Chlorhexidine  Gluconate Cloth  6 each Topical Daily   cyanocobalamin   500 mcg Oral Daily   pantoprazole  (PROTONIX ) IV  40 mg Intravenous Q12H   sodium chloride  flush  40 mL Intrapleural Q8H      Subjective:   Joshua [name] was seen and examined today.  Daughter and wife at the bedside, patient keeping his eyes closed and not wanting to talk today.  Daughter reported that he was not sleeping well, very tired this morning.  Overnight did not want  to get up to urinate and hence catheter was placed.    Objective:   Vitals:   10/01/23 0712 10/01/23 0800 10/01/23 0832 10/01/23 1000  BP:  (!) 150/80  (!) 152/65  Pulse: 81 79 75 78  Resp: (!) 25 (!) 29  (!) 23  Temp:  98.2 F (36.8 C)    TempSrc:  Oral    SpO2: 92% 94%  99%  Weight:  Height:        Intake/Output Summary (Last 24 hours) at 10/01/2023 1109 Last data filed at 10/01/2023 1000 Gross per 24 hour  Intake 290 ml  Output 900 ml  Net -610 ml     Wt Readings from Last 3 Encounters:  09/24/23 111 kg  04/27/23 115.2 kg  03/18/23 119.9 kg     Exam General: Alert and awake, keeping his eyes closed, follows commands Cardiovascular: S1 S2 auscultated,  RRR Respiratory: Diminished breath sounds, + chest tube Gastrointestinal: Soft, nontender, nondistended, + bowel sounds Ext: no pedal edema bilaterally Neuro: Strength 5/5 upper and lower extremities bilaterally Psych: flat affect    Data Reviewed:  I have personally reviewed following labs    CBC Lab Results  Component Value Date   WBC 5.7 10/01/2023   RBC 3.06 (L) 10/01/2023   HGB 9.5 (L) 10/01/2023   HCT 30.9 (L) 10/01/2023   MCV 101.0 (H) 10/01/2023   MCH 31.0 10/01/2023   PLT 151 10/01/2023   MCHC 30.7 10/01/2023   RDW 15.7 (H) 10/01/2023   LYMPHSABS 0.8 03/18/2023   MONOABS 0.4 03/18/2023   EOSABS 0.3 03/18/2023   BASOSABS 0.0 03/18/2023     Last metabolic panel Lab Results  Component Value Date   NA 142 10/01/2023   K 3.9 10/01/2023   CL 108 10/01/2023   CO2 25 10/01/2023   BUN 37 (H) 10/01/2023   CREATININE 1.75 (H) 10/01/2023   GLUCOSE 98 10/01/2023   GFRNONAA 40 (L) 10/01/2023   GFRAA >60 04/26/2018   CALCIUM  8.3 (L) 10/01/2023   PHOS 2.1 (L) 09/30/2023   PROT 5.7 (L) 10/01/2023   ALBUMIN  2.1 (L) 10/01/2023   LABGLOB 2.3 06/15/2022   BILITOT 0.8 10/01/2023   ALKPHOS 32 (L) 10/01/2023   AST 22 10/01/2023   ALT 11 10/01/2023   ANIONGAP 9 10/01/2023    CBG (last 3)   Recent Labs    09/30/23 1403  GLUCAP 88      Coagulation Profile: Recent Labs  Lab 09/27/23 0735 09/27/23 1614 09/28/23 0604 09/29/23 0353 09/30/23 0316  INR 2.6* 2.5* 1.3* 1.7* 1.5*     Radiology Studies: I have personally reviewed the imaging studies  DG Chest 1 View Result Date: 09/30/2023 CLINICAL DATA:  Chest tube in place. EXAM: CHEST  1 VIEW COMPARISON:  Radiographs 09/29/2023 and 09/28/2023.  CT 09/29/2023. FINDINGS: 0501 hours. Right chest tube appears unchanged in position. Small residual right pleural effusion appears unchanged. There is a small right apical pneumothorax with residual right basilar airspace disease. Right chest wall soft tissue emphysema appears mildly improved. The left lung is clear. The heart size and mediastinal contours are stable. IMPRESSION: 1. Small right apical pneumothorax with right chest tube in place. 2. Unchanged small right pleural effusion and right basilar airspace disease. Electronically Signed   By: Elsie Perone M.D.   On: 09/30/2023 09:03   DG Chest 1 View Result Date: 09/29/2023 CLINICAL DATA:  Right pleural effusion, right chest tube in place EXAM: CHEST  1 VIEW COMPARISON:  09/29/2023 FINDINGS: Two frontal views of the chest demonstrate right chest tube, tip and side port overlying the right lower hemithorax. Persistent but decreased partially loculated right pleural effusion. Continue consolidation at the right lung base. Left chest is clear. No pneumothorax. Stable enlargement of the cardiac silhouette. Subcutaneous gas within the right chest wall consistent with chest tube insertion. IMPRESSION: 1. Partially loculated right pleural effusion, slightly decreased after interval right chest tube placement. 2. Right  basilar consolidation, favor atelectasis. 3. Stable enlargement of the cardiac silhouette. Electronically Signed   By: Ozell Daring M.D.   On: 09/29/2023 16:44       Omnia Dollinger M.D. [facility] 10/01/2023, 11:09  AM  Available via Epic secure chat 7am-7pm After 7 pm, please refer to night coverage provider listed on amion.

## 2023-10-01 NOTE — Progress Notes (Addendum)
 Physical Therapy Treatment Patient Details Name: Joshua [name] MRN: [id number] DOB: 19-Oct-1950 Today's Date: 10/01/2023   History of Present Illness Patient is a 73 year old male who presented with SOB, melena and dizziness. Patient was admitted with symptomatic anemia secondary to GI bleed and AKI. Patient has thoracentesis on 12/31. PMH: a fib, CKD III, HTN, HLD, OSA, OA, CPAP.    PT Comments  Pt seen in ICU RM # [id number] General Comments: Pt AxO x 2 sleepy/groggy/dazed does follow VC's.  Delayed in responces and reaction. Supportive Daughter and Spouse in room. Pt on 2 lts nasal at 97% so trial RA during session. Assisted to EOB was difficult.  General bed mobility comments: Pt required increased assist this session Total Asisst + 2 Pt < 10% due to grogginess/delay in mvmt and appear fluid overloaded edenmous throughout.  Pt unable to use hands or lift arms/legs. Once upright, Pt was able to static sit x 4 min at Supervision level.  General transfer comment: Pt was unable to perform traditional sit to stand with walker.  Unable to self rise and unable to funactionally use hands to grip walker.  Performed Bear Hug SPS from elevated bed to [facility] due to incont BB upond standing.  Then :Heron Howard from [facility] to recliner + 2 asisst.  Pt slow/groggy/delayed. Once upright standing, Pt was able to support his own weight and take a few pivot steps from bed to [facility] then again from [facility] to recliner all 1/4 turn.  Positioned in recliner to comfort.  Daughter assisting with feeding pt his soup as he was unable to lift arm or grasp utensils due to weakness and UE edema.   Evaluating LPT has rec [facility] PT however Pt shows a medical and mobility decline.  Prior to admit was Indep living at home with his Spouse.  Will consult LPT.  Pt would be a good candidate for AIR.     If plan is discharge home, recommend the following:     Can travel by private vehicle        Equipment Recommendations       Recommendations for  Other Services       Precautions / Restrictions Precautions Precaution Comments: chest tube Restrictions Weight Bearing Restrictions Per Provider Order: No     Mobility  Bed Mobility Overal bed mobility: Needs Assistance Bed Mobility: Supine to Sit     Supine to sit: Total assist, +2 for physical assistance, +2 for safety/equipment     General bed mobility comments: Pt required increased assist this session Total Asisst + 2 Pt < 10% due to grogginess/delay in mvmt and appear fluid overloaded edenmous throughout.  Pt unable to use hands or lift arms/legs.    Transfers Overall transfer level: Needs assistance Equipment used: None Transfers: Bed to chair/wheelchair/BSC   Stand pivot transfers: Max assist, Total assist, +2 physical assistance, +2 safety/equipment         General transfer comment: Pt was unable to perform traditional sit to stand with walker.  Unable to self rise and unable to funactionally use hands to grip walker.  Performed Bear Hug SPS from elevated bed to [facility] due to incont BB upond standing.  Then :Heron Howard from [facility] to recliner + 2 asisst.  Pt slow/groggy/delayed.    Ambulation/Gait               General Gait Details: Unable to attempt due to poor transfer performance   Stairs  Wheelchair Mobility     Tilt Bed    Modified Rankin (Stroke Patients Only)       Balance                                            Cognition   Behavior During Therapy: Flat affect Overall Cognitive Status: Impaired/Different from baseline Area of Impairment: Attention, Safety/judgement, Problem solving, Awareness                               General Comments: Pt AxO x 2 sleepy/groggy/dazed does follow VC's.  Delayed in responces and reaction. Supportive Daughter and Spouse in room.        Exercises      General Comments        Pertinent Vitals/Pain Pain Assessment Pain Assessment: No/denies  pain    Home Living                          Prior Function            PT Goals (current goals can now be found in the care plan section) Progress towards PT goals: Progressing toward goals    Frequency    Min 1X/week      PT Plan      Co-evaluation              AM-PAC PT 6 Clicks Mobility   Outcome Measure  Help needed turning from your back to your side while in a flat bed without using bedrails?: Total Help needed moving from lying on your back to sitting on the side of a flat bed without using bedrails?: Total Help needed moving to and from a bed to a chair (including a wheelchair)?: Total Help needed standing up from a chair using your arms (e.g., wheelchair or bedside chair)?: Total Help needed to walk in hospital room?: Total Help needed climbing 3-5 steps with a railing? : Total 6 Click Score: 6    End of Session Equipment Utilized During Treatment: Gait belt Activity Tolerance: Treatment limited secondary to medical complications (Comment) Patient left: in chair;with call bell/phone within reach;with family/visitor present Nurse Communication: Mobility status;Need for lift equipment PT Visit Diagnosis: Difficulty in walking, not elsewhere classified (R26.2)     Time: 8651-8572 PT Time Calculation (min) (ACUTE ONLY): 39 min  Charges:    $Therapeutic Activity: 38-52 mins PT General Charges $$ ACUTE PT VISIT: 1 Visit                    Katheryn Leap  PTA Acute  Rehabilitation Services Office M-F          [phone]

## 2023-10-01 NOTE — Progress Notes (Addendum)
 Progress Note  LOS: 7 days   Chief Complaint:GI bleed secondary to gastric AVMs   Subjective   Patient states he is feeling good today.  Had full liquid diet last night without difficulty.  Had a small dark stool yesterday after returning from his EGD.  Had another small dark stool overnight but has not had any since.  Denies abdominal pain, nausea, vomiting.    Objective   Vital signs in last 24 hours: Temp:  [97.3 F (36.3 C)-98.3 F (36.8 C)] 98.3 F (36.8 C) (01/02 2320) Pulse Rate:  [45-90] 75 (01/03 0832) Resp:  [12-29] 29 (01/03 0800) BP: (112-151)/(50-85) 150/80 (01/03 0800) SpO2:  [92 %-100 %] 94 % (01/03 0800) Last BM Date : 09/30/23 Last BM recorded by nurses in past 5 days Stool Type: Type 6 (Mushy consistency with ragged edges) (09/30/2023  9:10 PM)  General:   male in no acute distress  Heart:  Regular rate and rhythm; no murmurs Pulm: Clear anteriorly; no wheezing Abdomen: soft, nondistended, normal bowel sounds in all quadrants. Nontender without guarding. No organomegaly appreciated. Extremities:  No edema Neurologic:  Alert and  oriented x4;  No focal deficits.  Psych:  Cooperative. Normal mood and affect.  Intake/Output from previous day: 01/02 0701 - 01/03 0700 In: 370 [I.V.:250] Out: 1120 [Urine:850; Chest Tube:270] Intake/Output this shift: No intake/output data recorded.  Studies/Results: DG Chest 1 View Result Date: 09/30/2023 CLINICAL DATA:  Chest tube in place. EXAM: CHEST  1 VIEW COMPARISON:  Radiographs 09/29/2023 and 09/28/2023.  CT 09/29/2023. FINDINGS: 0501 hours. Right chest tube appears unchanged in position. Small residual right pleural effusion appears unchanged. There is a small right apical pneumothorax with residual right basilar airspace disease. Right chest wall soft tissue emphysema appears mildly improved. The left lung is clear. The heart size and mediastinal contours are stable. IMPRESSION: 1. Small right apical pneumothorax with  right chest tube in place. 2. Unchanged small right pleural effusion and right basilar airspace disease. Electronically Signed   By: Elsie Perone M.D.   On: 09/30/2023 09:03   DG Chest 1 View Result Date: 09/29/2023 CLINICAL DATA:  Right pleural effusion, right chest tube in place EXAM: CHEST  1 VIEW COMPARISON:  09/29/2023 FINDINGS: Two frontal views of the chest demonstrate right chest tube, tip and side port overlying the right lower hemithorax. Persistent but decreased partially loculated right pleural effusion. Continue consolidation at the right lung base. Left chest is clear. No pneumothorax. Stable enlargement of the cardiac silhouette. Subcutaneous gas within the right chest wall consistent with chest tube insertion. IMPRESSION: 1. Partially loculated right pleural effusion, slightly decreased after interval right chest tube placement. 2. Right basilar consolidation, favor atelectasis. 3. Stable enlargement of the cardiac silhouette. Electronically Signed   By: Ozell Daring M.D.   On: 09/29/2023 16:44   CT CHEST WO CONTRAST Result Date: 09/29/2023 CLINICAL DATA:  Evaluate for malignancy. Pleural effusion. * Tracking Code: BO * EXAM: CT CHEST WITHOUT CONTRAST TECHNIQUE: Multidetector CT imaging of the chest was performed following the standard protocol without IV contrast. RADIATION DOSE REDUCTION: This exam was performed according to the departmental dose-optimization program which includes automated exposure control, adjustment of the mA and/or kV according to patient size and/or use of iterative reconstruction technique. COMPARISON:  None Available. FINDINGS: Cardiovascular: Heart size appears within normal limits. No significant pericardial effusion. Aortic atherosclerosis and coronary artery calcifications. No pericardial effusion. Mediastinum/Nodes: Thyroid gland, trachea and esophagus are unremarkable. No enlarged supraclavicular, axillary or  mediastinal lymph nodes. Hilar lymph nodes are  suboptimally evaluated due to lack of IV contrast. Lungs/Pleura: Central airways appear patent. There is a moderate right pleural effusion with loculated fluid extending along the minor fissure and posterior major fissure. Pleural fluid is mixed attenuation measuring between 25 Hounsfield units laterally an 2 Hounsfield units posteriorly. Scattered punctate calcified nodules compatible with prior granulomatous disease. No pulmonary mass or suspicious lung nodule identified. Upper Abdomen: Cyst overlying the anterior aspect of segment 4 B measures 1 cm. No acute abnormality noted within the imaged portions of the upper abdomen. Musculoskeletal: No acute or suspicious osseous findings. IMPRESSION: 1. Moderate right pleural effusion with loculated fluid extending along the minor fissure and posterior major fissure. Pleural fluid is mixed attenuation measuring between 25 Hounsfield units laterally and 2 Hounsfield units posteriorly. Findings are favored to represent a combination of simple and hemorrhagic pleural fluid. 2. No pulmonary mass or suspicious lung nodule identified. 3. Coronary artery calcifications. 4.  Aortic Atherosclerosis (ICD10-I70.0). Electronically Signed   By: Waddell Calk M.D.   On: 09/29/2023 14:31    Lab Results: Recent Labs    09/29/23 0353 09/29/23 0822 09/30/23 0316 10/01/23 0308  WBC 5.3  --  5.1 5.7  HGB 9.1* 8.7* 8.7* 9.5*  HCT 29.7* 27.4* 29.3* 30.9*  PLT 166  --  169 151   BMET Recent Labs    09/29/23 0353 09/30/23 0316 10/01/23 0308  NA 141 139 142  K 4.1 4.1 3.9  CL 108 107 108  CO2 26 25 25   GLUCOSE 113* 117* 98  BUN 51* 45* 37*  CREATININE 2.09* 2.03* 1.75*  CALCIUM  8.5* 8.4* 8.3*   LFT Recent Labs    10/01/23 0308  PROT 5.7*  ALBUMIN  2.1*  AST 22  ALT 11  ALKPHOS 32*  BILITOT 0.8   PT/INR Recent Labs    09/29/23 0353 09/30/23 0316  LABPROT 20.1* 18.0*  INR 1.7* 1.5*     Scheduled Meds: . carvedilol   3.125 mg Oral BID WC  .  Chlorhexidine  Gluconate Cloth  6 each Topical Daily  . cyanocobalamin   500 mcg Oral Daily  . pantoprazole  (PROTONIX ) IV  40 mg Intravenous Q12H  . sodium chloride  flush  40 mL Intrapleural Q8H   Continuous Infusions:    Patient profile:   73 year old male with a history of hypertension, DM type II, CKD stage IIIa, pancytopenia/suspected myelodysplasia, coronary artery disease s/p stent placement 2009, atrial fibrillation on Pradaxa  admitted to the hospital 09/24/2023 with progressive weakness, dizziness, shortness of breath, anemia and dark stools.    Impression/Plan   Symptomatic acute on chronic anemia with dark stools EGD 1/2: 2 gastric AVMs 1 with active intermittent oozing s/p APC.  Otherwise normal. Hgb 9.5 (improving from 8.7) - Continue Protonix  40 twice daily x 4 weeks and then daily indefinitely - Can have heart healthy diet as tolerated - Continue to monitor for melena, suspect recent stools were residual bleeding - Continue daily CBC and transfuse as needed to maintain HGB > 7 - Can restart Pradaxa  1/4 - If recurrent bleeding or drop in hemoglobin can consider colonoscopy with VCE thereafter  Acute respiratory failure with hypoxia Oxygen decreased from 4L nasal cannula to 2L nasal cannula  AKI on CKD  Atrial fibrillation Can restart Pradaxa  1/4  History of colon polyps. Colonoscopy in 2015 identified 1 tubular adenomatous and 1 hyperplastic polyp removed   Principal Problem:   Gastrointestinal hemorrhage with melena Active Problems:   Type 2 diabetes mellitus (  HCC)   Hyperlipidemia   Essential hypertension   Coronary artery disease due to lipid rich plaque   Paroxysmal atrial fibrillation (HCC)   Pulmonary hypertension (HCC)   Symptomatic anemia   ABLA (acute blood loss anemia)   Protein-calorie malnutrition, severe (HCC)   Thrombocytosis   Hyponatremia   AKI (acute kidney injury) (HCC)   CKD stage 3a, GFR 45-59 ml/min (HCC)   Hemothorax    Pneumothorax   Bayley M McMichael  10/01/2023, 8:45 AM     Attending physician's note   I have taken history, reviewed the chart and examined the patient. I performed a substantive portion of this encounter, including complete performance of at least one of the key components, in conjunction with the APP. I agree with the Advanced Practitioner's note, impression and recommendations.   No further bleeding. Had green stool  Hb stable 9.5  UGI bleed d/t gastric AVMs s/p APC in setting of AC (for A fib) H/O polyps 2015  Plan: -Start [facility] when OK with hospitalist service. ?eliquis  (rather than Pradaxa ) -Continue Protonix  40 twice daily x 4 weeks and then once a day indefinitely -FU with Dr. Norleen Kiang as outpt.  Can consider surveillance colonoscopy (or diagnostic if drop in hemoglobin), if patient desires. -D/W family. -Will sign off for now.   Anselm Bring, MD [facility] [phone]

## 2023-10-01 NOTE — Progress Notes (Signed)
 NAME:  Joshua [name], MRN:  [id number], DOB:  Dec 03, 1950, LOS: 7 ADMISSION DATE:  09/24/2023, CONSULTATION DATE:  09/28/23 REFERRING MD:  Mignon Bump, MD, CHIEF COMPLAINT:  Pleural effusion   History of Present Illness:  73 year old male with AF/flutter on Pradaxa , CAD s/p stents in 2009, DM2, CKD IIIA, HTN, HLD, OA OSA not on CPAP with presented with weakness, dizziness, SOB and melena admitted to [facility] for symptomatic anemia secondary to GI bleed and AKI. Since hospitalization he has received IVF, PRBC x 2U, PPI, vitamin K , praxibind and FFP. Planned for EGD today or tomorrow.  Yesterday had increased O2 requirement to 5L associated with epistaxis. Seen by [facility] and improved to 2L via [place] after blowing out nasal blood clots. CXR demonstrated loculated right pleural effusion. PCCM consulted for evaluation for thoracentesis.  Has shortness of breath with movement. Improved with oxygen. Denies past history of pleural effusion or need for thoracentesis  Pertinent  Medical History  AF/flutter on Pradaxa , CAD s/p stents in 2009, DM2, CKD IIIA, HTN, HLD, OA OSA not on CPAP  Significant Hospital Events: Including procedures, antibiotic start and stop dates in addition to other pertinent events   S/p thoracentesis 09/28/23 with 150 cc blood fluid removed 1/1 large bore   Interim History / Subjective:  Not wanting to talk today Not wanting to move today   Wife at bedside  270 out from chest tube.   Objective   Blood pressure (!) 150/80, pulse 75, temperature 98.2 F (36.8 C), temperature source Oral, resp. rate (!) 29, height 5' 8 (1.727 m), weight 111 kg, SpO2 94%.        Intake/Output Summary (Last 24 hours) at 10/01/2023 0921 Last data filed at 10/01/2023 0537 Gross per 24 hour  Intake 370 ml  Output 970 ml  Net -600 ml   Filed Weights   09/24/23 1114 09/24/23 1645  Weight: 83 kg 111 kg   Physical Exam: General: wdwn obese older adult M NAD  HENT: NCAT pink mm Howe in place   Respiratory: Symmetrical chest expansion. R chest tube with some clotted blood within tube proximal to insertion site, thin serosang appearing fluid in chamber  Cardiovascular: rr  Extremities: no acute joint deformity  Neuro: Oriented x 4, abstains from following commands for parts or exam intermittent, but has no focal neuro deficit  Psych: flat affect, minimally engaged. Annoyed appearing.    Resolved Hospital Problem list   N/A  Assessment & Plan:   Hemothorax Acute hypoxic resp failure 2/2 epistaxis aspiration, hemothorax, aspiration Small R apical ptx  R basilar ASD  -recent elevated INR. S/p thora 12/31 and large bore chest tube 1/1  -no malignant cells on cyto  P -cont chest tube to -40 sxn with frequent saline flushes  -PRN CXR -- will order for 1/4  -IS, mobility   - wean O2 as able  -at some point, will need an [facility] challenge    PCCM had also been asked to weigh in on risk stratification for GI interventions. Please see 12/31 pulm note for details.    Best Practice (right click and Reselect all SmartList Selections daily)   Diet/type: Regular consistency (see orders) DVT prophylaxis not indicated in setting of bleed Pressure ulcer(s): pressure ulcer assessment deferred  GI prophylaxis: PPI Lines: N/A Foley:  N/A Code Status:  full code Last date of multidisciplinary goals of care discussion [ per primary]   Critical care time: N/A    Low MDM  Ronnald Gave MSN, AGACNP-BC [place] [facility] Amion for pager  10/01/2023, 9:21 AM

## 2023-10-02 ENCOUNTER — Inpatient Hospital Stay (HOSPITAL_COMMUNITY): Payer: Medicare HMO

## 2023-10-02 DIAGNOSIS — J942 Hemothorax: Secondary | ICD-10-CM | POA: Diagnosis not present

## 2023-10-02 DIAGNOSIS — K921 Melena: Secondary | ICD-10-CM | POA: Diagnosis not present

## 2023-10-02 DIAGNOSIS — N1831 Chronic kidney disease, stage 3a: Secondary | ICD-10-CM | POA: Diagnosis not present

## 2023-10-02 DIAGNOSIS — N179 Acute kidney failure, unspecified: Secondary | ICD-10-CM | POA: Diagnosis not present

## 2023-10-02 DIAGNOSIS — K922 Gastrointestinal hemorrhage, unspecified: Secondary | ICD-10-CM | POA: Diagnosis not present

## 2023-10-02 DIAGNOSIS — J9601 Acute respiratory failure with hypoxia: Secondary | ICD-10-CM | POA: Diagnosis not present

## 2023-10-02 LAB — BASIC METABOLIC PANEL
Anion gap: 8 (ref 5–15)
BUN: 41 mg/dL — ABNORMAL HIGH (ref 8–23)
CO2: 24 mmol/L (ref 22–32)
Calcium: 8.1 mg/dL — ABNORMAL LOW (ref 8.9–10.3)
Chloride: 107 mmol/L (ref 98–111)
Creatinine, Ser: 1.69 mg/dL — ABNORMAL HIGH (ref 0.61–1.24)
GFR, Estimated: 42 mL/min — ABNORMAL LOW (ref >60)
Glucose, Bld: 112 mg/dL — ABNORMAL HIGH (ref 70–99)
Potassium: 3.9 mmol/L (ref 3.5–5.1)
Sodium: 139 mmol/L (ref 135–145)

## 2023-10-02 LAB — CBC
HCT: 29 % — ABNORMAL LOW (ref 39.0–52.0)
Hemoglobin: 8.7 g/dL — ABNORMAL LOW (ref 13.0–17.0)
MCH: 30.6 pg (ref 26.0–34.0)
MCHC: 30 g/dL (ref 30.0–36.0)
MCV: 102.1 fL — ABNORMAL HIGH (ref 80.0–100.0)
Platelets: 201 10*3/uL (ref 150–400)
RBC: 2.84 MIL/uL — ABNORMAL LOW (ref 4.22–5.81)
RDW: 15.6 % — ABNORMAL HIGH (ref 11.5–15.5)
WBC: 6.9 10*3/uL (ref 4.0–10.5)
nRBC: 0 % (ref 0.0–0.2)

## 2023-10-02 MED ORDER — SODIUM CHLORIDE 0.9% FLUSH
3.0000 mL | Freq: Two times a day (BID) | INTRAVENOUS | Status: DC
  Administered 2023-10-02 – 2023-10-04 (×4): 3 mL via INTRAVENOUS

## 2023-10-02 MED ORDER — MELATONIN 5 MG PO TABS
5.0000 mg | ORAL_TABLET | Freq: Every day | ORAL | Status: DC
  Administered 2023-10-02 – 2023-10-03 (×2): 5 mg via ORAL
  Filled 2023-10-02 (×2): qty 1

## 2023-10-02 MED ORDER — ISOSORBIDE MONONITRATE ER 60 MG PO TB24
30.0000 mg | ORAL_TABLET | Freq: Every day | ORAL | Status: DC
  Administered 2023-10-02 – 2023-10-04 (×3): 30 mg via ORAL
  Filled 2023-10-02 (×3): qty 1

## 2023-10-02 NOTE — Plan of Care (Signed)
  Problem: Clinical Measurements: Goal: Diagnostic test results will improve Outcome: Progressing Goal: Respiratory complications will improve Outcome: Progressing   Problem: Nutrition: Goal: Adequate nutrition will be maintained Outcome: Progressing   Problem: Elimination: Goal: Will not experience complications related to urinary retention Outcome: Progressing

## 2023-10-02 NOTE — Progress Notes (Signed)
 [facility]                                                                              Joshua [name], is a 73 y.o. male, DOB - 03-Feb-1951, FMW:[id number] Admit date - 09/24/2023    Outpatient Primary MD for the patient is Yolande Toribio MATSU, MD  LOS - 8  days  Chief Complaint  Patient presents with   Weakness   Dizziness   Shortness of Breath       Brief summary   Patient is a 73 year old M with PMH of PAF/flutter on Pradaxa , CAD s/p stents in 2009, DM-2, CKD-3A, OSA not on CPAP, HTN, HLD and osteoarthritis presenting with progressive generalized weakness, dizziness, dyspnea, melena and edema for weeks, and admitted with symptomatic anemia in the setting of GI bleed, AKI with uremia and hyponatremia.   In ED, vitals stable. Na 128. Cr 4.45 (1.4 on 8/13).  BUN 126.  Bicarb 15.  AG 12.  Hgb 7.0.  Platelets 148.  UA with microscopic hematuria.  One units of blood ordered.  Started on IV Protonix .  GI consulted.   EGD delayed due to elevated INR. IV vitamin K  and Praxibind on 12/28 and 12/30.  Also received FFP on 12/29.  Received 2 units of PRBC so far. EGD on 1/1 with 2 gastric AVMs, 1 with intermittent oozing and treated with APC.  GI recommended p.o. Protonix  40 mg twice daily for 4 weeks followed by 40 mg daily indefinitely.    Patient had an episode of increased oxygen requirement in the evening of 12/30.  Chest x-ray showed loculated right moderate pleural effusion.  PCCM consulted.  Thoracentesis yielded 150 cc dark bloody appearing fluid.  Follow-up CT chest with large right loculated pleural effusion concerning for clotted hemothorax.  PCCM placed large bore chest tube on 01/01.  TTE without significant finding. AKI improving with IV fluid.  Renal US  without acute finding.     Assessment & Plan    Principal Problem: Symptomatic ABLA due to upper GIB, right hemothorax: -Patient was on Pradaxa  for A-fib/flutter. Hgb 7.0 (was 13.1 on 6/20).  Had melena for over  2 weeks.  Was symptomatic.  S/p IV vitamin K  and Praxibind on 12/28 and 12/30.  Also received FFP on 12/29.  Received 2 units of PRBC. -  EGD on 1/1 with 2 gastric AVMs, 1 with intermittent oozing and treated with APC.  GI recommended p.o. Protonix  40 mg twice daily for 4 weeks followed by 40 mg daily indefinitely.  -Continue to hold Pradaxa  -Patient follows cardiology, Dr. Waddell, will need GI recommendations regarding anticoagulation once right-sided hemothorax is resolved. -H&H stable, downtrending 8.7     Acute respiratory failure with hypoxia:  - Due to OSA, atelectasis, hemothorax and nasal blockage from dry blood -Stable, sats 100% on 2L   Right-sided hemothorax: - Likely due to Pradaxa , INR supratherapeutic 9.5 on 12/27, reversed with FFP, Praxbind  --S/p thoracocentesis with removal of 150 cc dark bloody culture-negative exudative and neutrophilic fluid. -s/p right chest tube on 1/1.   -CT chest today showed a complex right-sided pleural gas and fluid collection could represent hemopneumothorax  or empyema, extensive past several atelectasis in the right lung. -Continue chest tube, management per CCM    AKI on CKD-3A/uremia:  - Cr 1.4 in 04/2023.  ATN from anemia, on Benicar  at home.  Denies NSAID use.  UA with hematuria. -   Renal US  without significant finding. -Creatinine improving, 1.6     Diabetes mellitus type 2, NIDDM with CKD-3A:  -A1c 5.5%.  CBG within normal range.     Essential hypertension:  - 2D echo 12/28 showed EF of 55 to 60%, no regional WMA  -BP somewhat elevated this morning, resume Imdur  -Continue home Coreg  at reduced dose   History of CAD s/p stent in 2009:  -Resumed home Coreg  at reduced dose.  Resume Imdur . -Losartan  on hold -CT chest reviewed, patient has a known history of CAD status post PCI in 2009, currently has no anginal symptoms, continue Coreg , Imdur .    Paroxysmal atrial fibrillation -  Per chart review, patient was switched from  warfarin to Pradaxa  in 2011.   -Rate controlled, continue Coreg , in A-fib -Continue to hold Pradaxa .   -Once cleared from GI standpoint and chest tube removed, will need cardiology recommendations regarding anticoagulation.  Patient follows Dr. Waddell.    Non-anion gap metabolic acidosis: - Likely due to renal failure.  Resolved.   Hyponatremia:  - Likely due to renal failure.  Resolved.   Epistaxis: Chronic issue likely from Pradaxa . - Resolved.  -S/p  Praxibind on 12/28 and vitamin K  on 12/28 and 12/30, and FFP on 12/29 -Hb 8.7   Gross hematuria: Likely due to coagulopathy.  Resolving. -Dr Kathrin discussed with urology, Dr. Watt on 12/30, was recommended outpatient follow-up.    Sundowning, insomnia -Per daughter, patient is having agitation at night and not sleeping.   Thrombocytopenia:  -Resolved.   Physical deconditioning -PT/OT-recommended CIR   Hyperlipidemia -Continue rosuvastatin     Elevated INR: 9.5 on 12/28>> 1.5 -S/p Praxbind , vitamin K  and FFP.   Morbid obesity:  Estimated body mass index is 37.21 kg/m as calculated from the following:   Height as of this encounter: 5' 8 (1.727 m).   Weight as of this encounter: 111 kg.  Code Status: Full code DVT Prophylaxis:  SCDs Start: 09/24/23 1503   Level of Care: Level of care: Stepdown Family Communication: Updated patient's wife and daughter at the bedside today Disposition Plan:      Remains inpatient appropriate:      Procedures:  2D echo 12/31-right thoracocentesis with removal of 150 cc dark bloody appearing fluid 1/1-right chest tube by pulmonology 1/2-EGD with 2 gastric AVMs, 1 of which oozing and treated with APC's  Consultants:   PCCM GI  Antimicrobials:   Anti-infectives (From admission, onward)    None          Medications  carvedilol   3.125 mg Oral BID WC   Chlorhexidine  Gluconate Cloth  6 each Topical Daily   cyanocobalamin   500 mcg Oral Daily   melatonin  3 mg Oral QHS    pantoprazole  (PROTONIX ) IV  40 mg Intravenous Q12H   sodium chloride  flush  40 mL Intrapleural Q8H   tamsulosin   0.4 mg Oral Daily      Subjective:   Joshua [name] was seen and examined today.  Daughter and wife at the bedside.  No acute complaints today, much more alert and awake.  No acute chest pain, shortness of breath, fevers or chills.  Objective:   Vitals:   10/02/23 0911 10/02/23 1000 10/02/23 1005 10/02/23  1010  BP:   (!) 167/42   Pulse:  (!) 166 (!) 160 60  Resp:  (!) 24 (!) 27 (!) 25  Temp: 98 F (36.7 C)     TempSrc: Oral     SpO2:  98% 99% 100%  Weight:      Height:        Intake/Output Summary (Last 24 hours) at 10/02/2023 1142 Last data filed at 10/02/2023 9392 Gross per 24 hour  Intake 380 ml  Output 1080 ml  Net -700 ml     Wt Readings from Last 3 Encounters:  09/24/23 111 kg  04/27/23 115.2 kg  03/18/23 119.9 kg    Physical Exam General: Alert and oriented x 3, NAD Cardiovascular: S1 S2 clear, RRR.  Respiratory: Diminished breath sounds on the right, chest tube in place Gastrointestinal: Soft, nontender, nondistended, NBS Ext: no pedal edema bilaterally Neuro: no new deficits Psych: flat affect    Data Reviewed:  I have personally reviewed following labs    CBC Lab Results  Component Value Date   WBC 6.9 10/02/2023   RBC 2.84 (L) 10/02/2023   HGB 8.7 (L) 10/02/2023   HCT 29.0 (L) 10/02/2023   MCV 102.1 (H) 10/02/2023   MCH 30.6 10/02/2023   PLT 201 10/02/2023   MCHC 30.0 10/02/2023   RDW 15.6 (H) 10/02/2023   LYMPHSABS 0.8 03/18/2023   MONOABS 0.4 03/18/2023   EOSABS 0.3 03/18/2023   BASOSABS 0.0 03/18/2023     Last metabolic panel Lab Results  Component Value Date   NA 139 10/02/2023   K 3.9 10/02/2023   CL 107 10/02/2023   CO2 24 10/02/2023   BUN 41 (H) 10/02/2023   CREATININE 1.69 (H) 10/02/2023   GLUCOSE 112 (H) 10/02/2023   GFRNONAA 42 (L) 10/02/2023   GFRAA >60 04/26/2018   CALCIUM  8.1 (L) 10/02/2023   PHOS 2.1  (L) 09/30/2023   PROT 5.7 (L) 10/01/2023   ALBUMIN  2.1 (L) 10/01/2023   LABGLOB 2.3 06/15/2022   BILITOT 0.8 10/01/2023   ALKPHOS 32 (L) 10/01/2023   AST 22 10/01/2023   ALT 11 10/01/2023   ANIONGAP 8 10/02/2023    CBG (last 3)  Recent Labs    09/30/23 1403  GLUCAP 88      Coagulation Profile: Recent Labs  Lab 09/27/23 0735 09/27/23 1614 09/28/23 0604 09/29/23 0353 09/30/23 0316  INR 2.6* 2.5* 1.3* 1.7* 1.5*     Radiology Studies: I have personally reviewed the imaging studies  DG CHEST PORT 1 VIEW Result Date: 10/02/2023 CLINICAL DATA:  Chest tube in place.  Follow-up pneumothorax. EXAM: PORTABLE CHEST 1 VIEW COMPARISON:  10/01/2023 FINDINGS: Right lower chest tube is stable in position from previous exam. Right apical pneumothorax measures 1.6 cm over the apex. This is unchanged from previous exam. Asymmetric opacification within the right mid and lower lung is unchanged in the interval. Left lung is clear. IMPRESSION: 1. Stable right apical pneumothorax. 2. Stable right chest tube. 3. No change in aeration to the right mid and lower lung. Electronically Signed   By: Waddell Calk M.D.   On: 10/02/2023 10:46   CT CHEST WO CONTRAST Result Date: 10/02/2023 CLINICAL DATA:  73 year old male with suspected hemopneumothorax. EXAM: CT CHEST WITHOUT CONTRAST TECHNIQUE: Multidetector CT imaging of the chest was performed following the standard protocol without IV contrast. RADIATION DOSE REDUCTION: This exam was performed according to the departmental dose-optimization program which includes automated exposure control, adjustment of the mA and/or kV according to  patient size and/or use of iterative reconstruction technique. COMPARISON:  Chest CT 09/29/2023. FINDINGS: Cardiovascular: Heart size is mildly enlarged. There is no significant pericardial fluid, thickening or pericardial calcification. There is aortic atherosclerosis, as well as atherosclerosis of the great vessels of the  mediastinum and the coronary arteries, including calcified atherosclerotic plaque in the left main, left anterior descending, left circumflex and right coronary arteries. Calcifications of the mitral annulus. Dilatation of the pulmonic trunk (4.0 cm in diameter). Mediastinum/Nodes: No pathologically enlarged mediastinal or hilar lymph nodes. Please note that accurate exclusion of hilar adenopathy is limited on noncontrast CT scans. Esophagus is unremarkable in appearance. No axillary lymphadenopathy. Lungs/Pleura: Right-sided chest tube noted extending into the right major fissure. Large volume of complex right-sided pleural fluid, with multiple internal locules of gas, many of which are not non dependent, suggesting loculated pleural effusion (potentially hemopneumothorax or empyema). Passive atelectasis in the right lung, most severe in the right lower lobe. No confluent consolidative airspace disease. No left pleural effusion. No definite suspicious appearing pulmonary nodule or mass identified. Upper Abdomen: Aortic atherosclerosis. Musculoskeletal: There are no aggressive appearing lytic or blastic lesions noted in the visualized portions of the skeleton. IMPRESSION: 1. Complex right-sided pleural gas and fluid collection which could represent a hemopneumothorax or empyema. Extensive passive atelectasis in the right lung. 2. Cardiomegaly. 3. Aortic atherosclerosis, in addition to left main and three-vessel coronary artery disease. Assessment for potential risk factor modification, dietary therapy or pharmacologic therapy may be warranted, if clinically indicated. 4. Dilatation of the pulmonic trunk (4.0 cm in diameter), concerning for pulmonary arterial hypertension. Aortic Atherosclerosis (ICD10-I70.0). Electronically Signed   By: Toribio Aye M.D.   On: 10/02/2023 08:57   DG CHEST PORT 1 VIEW Result Date: 10/01/2023 CLINICAL DATA:  Pneumothorax EXAM: PORTABLE CHEST 1 VIEW COMPARISON:  X-ray 09/30/2023  and older FINDINGS: Stable right chest tube with a tiny right apical pneumothorax. Patchy right lung opacities are decreasing with significant residual. Elevation of the right hemidiaphragm. Stable subtle opacity left lung base. No left-sided pneumothorax. No edema. Stable cardiopericardial silhouette. Overlapping cardiac leads. Films are under penetrated. IMPRESSION: Stable right chest tube and small right apical pneumothorax. Decreasing patchy right lung opacity slightly with significant residual. Electronically Signed   By: Ranell Bring M.D.   On: 10/01/2023 14:14       Jeorgia Helming M.D. [facility] 10/02/2023, 11:42 AM  Available via Epic secure chat 7am-7pm After 7 pm, please refer to night coverage provider listed on amion.

## 2023-10-02 NOTE — Progress Notes (Signed)
 PCCM Interval Note  Chest tube pulled 4-5 cm (10 cm mark at insertion site) under sterile technique. Flushed chest tube and removed small clots. Some improved flow. Resutured chest tube and returned to suction.

## 2023-10-02 NOTE — Progress Notes (Addendum)
   NAME:  Joshua [name], MRN:  [id number], DOB:  11/10/1950, LOS: 8 ADMISSION DATE:  09/24/2023, CONSULTATION DATE:  09/28/23 REFERRING MD:  Mignon Bump, MD, CHIEF COMPLAINT:  Pleural effusion   History of Present Illness:  73 year old male with AF/flutter on Pradaxa , CAD s/p stents in 2009, DM2, CKD IIIA, HTN, HLD, OA OSA not on CPAP with presented with weakness, dizziness, SOB and melena admitted to [facility] for symptomatic anemia secondary to GI bleed and AKI. Since hospitalization he has received IVF, PRBC x 2U, PPI, vitamin K , praxibind and FFP. Planned for EGD today or tomorrow.  Yesterday had increased O2 requirement to 5L associated with epistaxis. Seen by [facility] and improved to 2L via [place] after blowing out nasal blood clots. CXR demonstrated loculated right pleural effusion. PCCM consulted for evaluation for thoracentesis.  Has shortness of breath with movement. Improved with oxygen. Denies past history of pleural effusion or need for thoracentesis  Pertinent  Medical History  AF/flutter on Pradaxa , CAD s/p stents in 2009, DM2, CKD IIIA, HTN, HLD, OA OSA not on CPAP  Significant Hospital Events: Including procedures, antibiotic start and stop dates in addition to other pertinent events   S/p thoracentesis 09/28/23 with 150 cc blood fluid removed 1/1 large bore   Interim History / Subjective:  CT Chest reviewed with patient and daughter.   Objective   Blood pressure (!) 167/42, pulse 60, temperature 98 F (36.7 C), temperature source Oral, resp. rate (!) 25, height 5' 8 (1.727 m), weight 111 kg, SpO2 100%.        Intake/Output Summary (Last 24 hours) at 10/02/2023 1053 Last data filed at 10/02/2023 0607 Gross per 24 hour  Intake 380 ml  Output 1080 ml  Net -700 ml   Filed Weights   09/24/23 1114 09/24/23 1645  Weight: 83 kg 111 kg   Physical Exam: General: Well-appearing, no acute distress HENT: [place], AT, OP clear, MMM Eyes: EOMI, no scleral icterus Respiratory: Diminished air  entry on right side auscultation, right chest tube in place Cardiovascular: RRR, -M/R/G, no JVD Extremities:-Edema,-tenderness Neuro: Awake alert, not interactive, CNII-XII grossly intact  CT Chest 10/02/23 - Right chest tube in right major fissur. Large volume complex effusion remains, right lower lobe atelectasis  Resolved Hospital Problem list   N/A  Assessment & Plan:   Hemothorax Acute hypoxic resp failure 2/2 epistaxis aspiration, hemothorax, aspiration Small R apical ptx  R basilar ASD  -recent elevated INR. S/p thora 12/31 and large bore chest tube 1/1  -no malignant cells on cyto  P -Plan to withdraw chest tube 5-7 cm if able today -Continue chest tube to suction with frequent saline flushes -IS, mobility   -wean O2 as able  -PRN chest imaging   Best Practice (right click and Reselect all SmartList Selections daily)   Diet/type: Regular consistency (see orders) DVT prophylaxis not indicated in setting of bleed Pressure ulcer(s): pressure ulcer assessment deferred  GI prophylaxis: PPI Lines: N/A Foley:  N/A Code Status:  full code Last date of multidisciplinary goals of care discussion [ per primary]   Critical care time: N/A    Care Time: 50 min  Slater Staff, M.D. [facility] 10/02/2023 10:53 AM   See Amion for personal pager For hours between 7 PM to 7 AM, please call Elink for urgent questions

## 2023-10-02 NOTE — Plan of Care (Signed)
  Problem: Clinical Measurements: Goal: Diagnostic test results will improve Outcome: Progressing Goal: Respiratory complications will improve Outcome: Progressing Goal: Cardiovascular complication will be avoided Outcome: Progressing   Problem: Coping: Goal: Level of anxiety will decrease Outcome: Progressing   

## 2023-10-02 NOTE — Progress Notes (Signed)

## 2023-10-03 DIAGNOSIS — N1831 Chronic kidney disease, stage 3a: Secondary | ICD-10-CM | POA: Diagnosis not present

## 2023-10-03 DIAGNOSIS — J942 Hemothorax: Secondary | ICD-10-CM | POA: Diagnosis not present

## 2023-10-03 DIAGNOSIS — N179 Acute kidney failure, unspecified: Secondary | ICD-10-CM | POA: Diagnosis not present

## 2023-10-03 DIAGNOSIS — D62 Acute posthemorrhagic anemia: Secondary | ICD-10-CM | POA: Diagnosis not present

## 2023-10-03 DIAGNOSIS — J9601 Acute respiratory failure with hypoxia: Secondary | ICD-10-CM | POA: Diagnosis not present

## 2023-10-03 DIAGNOSIS — K921 Melena: Secondary | ICD-10-CM | POA: Diagnosis not present

## 2023-10-03 MED ORDER — CARBIDOPA-LEVODOPA 25-100 MG PO TABS
1.0000 | ORAL_TABLET | Freq: Three times a day (TID) | ORAL | Status: DC
  Administered 2023-10-03 – 2023-10-05 (×5): 1 via ORAL
  Filled 2023-10-03 (×7): qty 1

## 2023-10-03 NOTE — Plan of Care (Signed)
  Problem: Education: Goal: Knowledge of General Education information will improve Description: Including pain rating scale, medication(s)/side effects and non-pharmacologic comfort measures Outcome: Progressing   Problem: Health Behavior/Discharge Planning: Goal: Ability to manage health-related needs will improve Outcome: Progressing   Problem: Clinical Measurements: Goal: Will remain free from infection Outcome: Progressing   Problem: Nutrition: Goal: Adequate nutrition will be maintained Outcome: Progressing   Problem: Safety: Goal: Ability to remain free from injury will improve Outcome: Progressing   Problem: Metabolic: Goal: Ability to maintain appropriate glucose levels will improve Outcome: Progressing   Problem: Tissue Perfusion: Goal: Adequacy of tissue perfusion will improve Outcome: Progressing

## 2023-10-03 NOTE — Plan of Care (Signed)
  Problem: Clinical Measurements: Goal: Will remain free from infection Outcome: Progressing Goal: Diagnostic test results will improve Outcome: Progressing Goal: Respiratory complications will improve Outcome: Progressing   Problem: Nutrition: Goal: Adequate nutrition will be maintained Outcome: Progressing   Problem: Coping: Goal: Level of anxiety will decrease Outcome: Not Progressing Note: Agitation and anxiety increasing, confusion and delirium increasing.

## 2023-10-03 NOTE — Progress Notes (Signed)
 NAME:  Joshua [name], MRN:  [id number], DOB:  August 11, 1951, LOS: 9 ADMISSION DATE:  09/24/2023, CONSULTATION DATE:  09/28/23 REFERRING MD:  Mignon Bump, MD, CHIEF COMPLAINT:  Pleural effusion   History of Present Illness:  73 year old male with AF/flutter on Pradaxa , CAD s/p stents in 2009, DM2, CKD IIIA, HTN, HLD, OA OSA not on CPAP with presented with weakness, dizziness, SOB and melena admitted to [facility] for symptomatic anemia secondary to GI bleed and AKI. Since hospitalization he has received IVF, PRBC x 2U, PPI, vitamin K , praxibind and FFP. Planned for EGD today or tomorrow.  Yesterday had increased O2 requirement to 5L associated with epistaxis. Seen by [facility] and improved to 2L via [place] after blowing out nasal blood clots. CXR demonstrated loculated right pleural effusion. PCCM consulted for evaluation for thoracentesis.  Has shortness of breath with movement. Improved with oxygen. Denies past history of pleural effusion or need for thoracentesis  Pertinent  Medical History  AF/flutter on Pradaxa , CAD s/p stents in 2009, DM2, CKD IIIA, HTN, HLD, OA OSA not on CPAP  Significant Hospital Events: Including procedures, antibiotic start and stop dates in addition to other pertinent events   S/p thoracentesis 09/28/23 with 150 cc blood fluid removed 1/1 large bore  Chest CT with complex right pleura gas and fluid collection and atelectasis of right lung. Right chest tube extending into right major fissues1/4 Chest tube   Interim History / Subjective:  Chest tube pulled ~5cm yesterday Chest tube with 350 cc output however this mostly represents frequent flushes  Objective   Blood pressure (!) 160/59, pulse (!) 59, temperature 98.1 F (36.7 C), temperature source Oral, resp. rate (!) 22, height 5' 8 (1.727 m), weight 112.8 kg, SpO2 100%.        Intake/Output Summary (Last 24 hours) at 10/03/2023 1100 Last data filed at 10/03/2023 1045 Gross per 24 hour  Intake 630 ml  Output 1295 ml  Net -665  ml   Filed Weights   09/24/23 1114 09/24/23 1645 10/03/23 0442  Weight: 83 kg 111 kg 112.8 kg   Physical Exam: General: Well-appearing, no acute distress HENT: [place], AT, OP clear, MMM Eyes: EOMI, no scleral icterus Respiratory: Diminished right air entry to auscultation.  No crackles, wheezing or rales Cardiovascular: RRR, -M/R/G, no JVD Extremities:-Edema,-tenderness Neuro: Awake and alert, CNII-XII grossly intact, pill rolling bilaterally, no appreciable rigidity on my exam   Imaging, labs and test in EMR in the last 24 hours reviewed independently by me. Pertinent findings below: AKI slowly improving Hg stable 8.7  Resolved Hospital Problem list   N/A  Assessment & Plan:   Hemothorax Acute hypoxic resp failure 2/2 epistaxis aspiration, hemothorax, aspiration Small R apical ptx  R basilar ASD  -recent elevated INR. S/p thora 12/31 and large bore chest tube 1/1  -no malignant cells on cyto  -1/4 Right chest tube withdrawn 5cm P -Consult cardiothoracic surgery for recommendations regarding eligibility of VATS -Chest tube to suction -40 cm H20 -Frequent flushes -IS, mobility   -wean O2 as able  -PRN chest imaging   Best Practice (right click and Reselect all SmartList Selections daily)   Diet/type: Regular consistency (see orders) DVT prophylaxis not indicated in setting of bleed Pressure ulcer(s): pressure ulcer assessment deferred  GI prophylaxis: PPI Lines: N/A Foley:  N/A Code Status:  full code Last date of multidisciplinary goals of care discussion [ per primary]   Critical care time: N/A    Care Time: 35 min  Slater Staff, M.D. [facility] Pulmonary/Critical Care Medicine 10/03/2023 11:14 AM   See Amion for personal pager For hours between 7 PM to 7 AM, please call Elink for urgent questions

## 2023-10-03 NOTE — Progress Notes (Addendum)
 [facility]                                                                              Aleksandar Duve, is a 73 y.o. male, DOB - 06/24/1951, FMW:[id number] Admit date - 09/24/2023    Outpatient Primary MD for the patient is Yolande Toribio MATSU, MD  LOS - 9  days  Chief Complaint  Patient presents with   Weakness   Dizziness   Shortness of Breath       Brief summary   Patient is a 73 year old M with PMH of PAF/flutter on Pradaxa , CAD s/p stents in 2009, DM-2, CKD-3A, OSA not on CPAP, HTN, HLD and osteoarthritis presenting with progressive generalized weakness, dizziness, dyspnea, melena and edema for weeks, and admitted with symptomatic anemia in the setting of GI bleed, AKI with uremia and hyponatremia.   In ED, vitals stable. Na 128. Cr 4.45 (1.4 on 8/13).  BUN 126.  Bicarb 15.  AG 12.  Hgb 7.0.  Platelets 148.  UA with microscopic hematuria.  One units of blood ordered.  Started on IV Protonix .  GI consulted.   EGD delayed due to elevated INR. IV vitamin K  and Praxibind on 12/28 and 12/30.  Also received FFP on 12/29.  Received 2 units of PRBC so far. EGD on 1/1 with 2 gastric AVMs, 1 with intermittent oozing and treated with APC.  GI recommended p.o. Protonix  40 mg twice daily for 4 weeks followed by 40 mg daily indefinitely.    Patient had an episode of increased oxygen requirement in the evening of 12/30.  Chest x-ray showed loculated right moderate pleural effusion.  PCCM consulted.  Thoracentesis yielded 150 cc dark bloody appearing fluid.  Follow-up CT chest with large right loculated pleural effusion concerning for clotted hemothorax.  PCCM placed large bore chest tube on 01/01.  TTE without significant finding. AKI improving with IV fluid.  Renal US  without acute finding.     Assessment & Plan    Principal Problem: Symptomatic ABLA due to upper GIB, right hemothorax: -Patient was on Pradaxa  for A-fib/flutter. Hgb 7.0 (was 13.1 on 6/20).  Had melena for over  2 weeks.  Was symptomatic.  S/p IV vitamin K  and Praxibind on 12/28 and 12/30.  Also received FFP on 12/29.  Received 2 units of PRBC. -  EGD on 1/1 with 2 gastric AVMs, 1 with intermittent oozing and treated with APC.  GI recommended p.o. Protonix  40 mg twice daily for 4 weeks followed by 40 mg daily indefinitely.  -Pradaxa  held -Patient follows cardiology, Dr. Waddell, outpatient, will need recommendations regarding anticoagulation once right-sided hemothorax is resolved. -CCM following for chest tube, consulting cardiothoracic surgery for recommendations regarding eligibility of VATS     Acute respiratory failure with hypoxia:  - Due to OSA, atelectasis, hemothorax and nasal blockage from dry blood -Stable, O2 sats 100% on 3 L, wean O2 as tolerated   Right-sided hemothorax: - Likely due to Pradaxa , INR supratherapeutic 9.5 on 12/27, reversed with FFP, Praxbind  --S/p thoracocentesis with removal of 150 cc dark bloody culture-negative exudative and neutrophilic fluid. -s/p right chest tube on 1/1.   -CT  chest 1/4 showed a complex right-sided pleural gas and fluid collection could represent hemopneumothorax or empyema, extensive past several atelectasis in the right lung. -Continue chest tube, management per CCM.  CTS consulted for consideration of VATS.    AKI on CKD-3A/uremia:  - Cr 1.4 in 04/2023.  ATN from anemia, on Benicar  at home.  Denies NSAID use.  UA with hematuria. -   Renal US  without significant finding. - Cr Stable, 1.6     Diabetes mellitus type 2, NIDDM with CKD-3A:  -A1c 5.5%.  CBG within normal range.     Essential hypertension:  - 2D echo 12/28 showed EF of 55 to 60%, no regional WMA  -Continue Coreg , Imdur     History of CAD s/p stent in 2009:  -Resumed home Coreg  at reduced dose.  Resume Imdur . -Losartan  on hold -CT chest reviewed, patient has a known history of CAD status post PCI in 2009, currently has no anginal symptoms, continue Coreg , Imdur .     Paroxysmal atrial fibrillation -  Per chart review, patient was switched from warfarin to Pradaxa  in 2011.   -Continue Coreg  -Continue to hold Pradaxa .   -Once cleared from GI standpoint and chest tube removed, will need cardiology recommendations regarding anticoagulation.  Patient follows Dr. Waddell.    Non-anion gap metabolic acidosis: - Likely due to renal failure.  Resolved.   Hyponatremia:  - Likely due to renal failure.  Resolved.   Epistaxis: Chronic issue likely from Pradaxa . - Resolved.  -S/p  Praxibind on 12/28 and vitamin K  on 12/28 and 12/30, and FFP on 12/29 -Follow H&H   Gross hematuria: Likely due to coagulopathy.  Resolving. -Dr Kathrin discussed with urology, Dr. Watt on 12/30, was recommended outpatient follow-up.    Sundowning, insomnia -Continue melatonin   Thrombocytopenia:  -Resolved.   Physical deconditioning -PT/OT-recommended CIR   Hyperlipidemia -Continue rosuvastatin     Elevated INR: 9.5 on 12/28>> 1.5 -S/p Praxbind , vitamin K  and FFP.   Morbid obesity:  Estimated body mass index is 37.81 kg/m as calculated from the following:   Height as of this encounter: 5' 8 (1.727 m).   Weight as of this encounter: 112.8 kg.  Code Status: Full code DVT Prophylaxis:  SCDs Start: 09/24/23 1503   Level of Care: Level of care: Stepdown Family Communication: Updated patient's wife and daughter on 1/4 at bedside.  Updated patient's wife on the phone today  Disposition Plan:      Remains inpatient appropriate:      Procedures:  2D echo 12/31-right thoracocentesis with removal of 150 cc dark bloody appearing fluid 1/1-right chest tube by pulmonology 1/2-EGD with 2 gastric AVMs, 1 of which oozing and treated with APC's  Consultants:   PCCM GI  Antimicrobials:   Anti-infectives (From admission, onward)    None          Medications  carbidopa -levodopa   1 tablet Oral TID   carvedilol   3.125 mg Oral BID WC   Chlorhexidine  Gluconate Cloth   6 each Topical Daily   cyanocobalamin   500 mcg Oral Daily   isosorbide  mononitrate  30 mg Oral Daily   melatonin  5 mg Oral QHS   pantoprazole  (PROTONIX ) IV  40 mg Intravenous Q12H   sodium chloride  flush  3 mL Intravenous Q12H   sodium chloride  flush  40 mL Intrapleural Q8H   tamsulosin   0.4 mg Oral Daily      Subjective:   Joshua [name] was seen and examined today morning.  This morning noted to  be resting comfortably, no acute issues reported from overnight.  No fevers or chills, chest pain or shortness of breath.  No family member at the bedside today.  Objective:   Vitals:   10/03/23 0836 10/03/23 0900 10/03/23 1000 10/03/23 1100  BP:   122/68   Pulse:  (!) 54 62 64  Resp:  20 (!) 21 19  Temp: 98.1 F (36.7 C)     TempSrc: Oral     SpO2:  100% 100% 100%  Weight:      Height:        Intake/Output Summary (Last 24 hours) at 10/03/2023 1212 Last data filed at 10/03/2023 1045 Gross per 24 hour  Intake 690 ml  Output 1295 ml  Net -605 ml     Wt Readings from Last 3 Encounters:  10/03/23 112.8 kg  04/27/23 115.2 kg  03/18/23 119.9 kg   Physical Exam General: Sleepy but easily arousable, NAD Cardiovascular: S1 S2 clear, RRR.  Respiratory: Decreased breath sounds on the right, chest tube in place Gastrointestinal: Soft, nontender, nondistended, NBS Ext: 1+ pedal edema bilaterally Neuro: no new FND's psych: sleepy    Data Reviewed:  I have personally reviewed following labs    CBC Lab Results  Component Value Date   WBC 6.9 10/02/2023   RBC 2.84 (L) 10/02/2023   HGB 8.7 (L) 10/02/2023   HCT 29.0 (L) 10/02/2023   MCV 102.1 (H) 10/02/2023   MCH 30.6 10/02/2023   PLT 201 10/02/2023   MCHC 30.0 10/02/2023   RDW 15.6 (H) 10/02/2023   LYMPHSABS 0.8 03/18/2023   MONOABS 0.4 03/18/2023   EOSABS 0.3 03/18/2023   BASOSABS 0.0 03/18/2023     Last metabolic panel Lab Results  Component Value Date   NA 139 10/02/2023   K 3.9 10/02/2023   CL 107 10/02/2023    CO2 24 10/02/2023   BUN 41 (H) 10/02/2023   CREATININE 1.69 (H) 10/02/2023   GLUCOSE 112 (H) 10/02/2023   GFRNONAA 42 (L) 10/02/2023   GFRAA >60 04/26/2018   CALCIUM  8.1 (L) 10/02/2023   PHOS 2.1 (L) 09/30/2023   PROT 5.7 (L) 10/01/2023   ALBUMIN  2.1 (L) 10/01/2023   LABGLOB 2.3 06/15/2022   BILITOT 0.8 10/01/2023   ALKPHOS 32 (L) 10/01/2023   AST 22 10/01/2023   ALT 11 10/01/2023   ANIONGAP 8 10/02/2023    CBG (last 3)  Recent Labs    09/30/23 1403  GLUCAP 88      Coagulation Profile: Recent Labs  Lab 09/27/23 0735 09/27/23 1614 09/28/23 0604 09/29/23 0353 09/30/23 0316  INR 2.6* 2.5* 1.3* 1.7* 1.5*     Radiology Studies: I have personally reviewed the imaging studies  DG CHEST PORT 1 VIEW Result Date: 10/02/2023 CLINICAL DATA:  Chest tube in place.  Follow-up pneumothorax. EXAM: PORTABLE CHEST 1 VIEW COMPARISON:  10/01/2023 FINDINGS: Right lower chest tube is stable in position from previous exam. Right apical pneumothorax measures 1.6 cm over the apex. This is unchanged from previous exam. Asymmetric opacification within the right mid and lower lung is unchanged in the interval. Left lung is clear. IMPRESSION: 1. Stable right apical pneumothorax. 2. Stable right chest tube. 3. No change in aeration to the right mid and lower lung. Electronically Signed   By: Waddell Calk M.D.   On: 10/02/2023 10:46   CT CHEST WO CONTRAST Result Date: 10/02/2023 CLINICAL DATA:  73 year old male with suspected hemopneumothorax. EXAM: CT CHEST WITHOUT CONTRAST TECHNIQUE: Multidetector CT imaging of the chest  was performed following the standard protocol without IV contrast. RADIATION DOSE REDUCTION: This exam was performed according to the departmental dose-optimization program which includes automated exposure control, adjustment of the mA and/or kV according to patient size and/or use of iterative reconstruction technique. COMPARISON:  Chest CT 09/29/2023. FINDINGS: Cardiovascular:  Heart size is mildly enlarged. There is no significant pericardial fluid, thickening or pericardial calcification. There is aortic atherosclerosis, as well as atherosclerosis of the great vessels of the mediastinum and the coronary arteries, including calcified atherosclerotic plaque in the left main, left anterior descending, left circumflex and right coronary arteries. Calcifications of the mitral annulus. Dilatation of the pulmonic trunk (4.0 cm in diameter). Mediastinum/Nodes: No pathologically enlarged mediastinal or hilar lymph nodes. Please note that accurate exclusion of hilar adenopathy is limited on noncontrast CT scans. Esophagus is unremarkable in appearance. No axillary lymphadenopathy. Lungs/Pleura: Right-sided chest tube noted extending into the right major fissure. Large volume of complex right-sided pleural fluid, with multiple internal locules of gas, many of which are not non dependent, suggesting loculated pleural effusion (potentially hemopneumothorax or empyema). Passive atelectasis in the right lung, most severe in the right lower lobe. No confluent consolidative airspace disease. No left pleural effusion. No definite suspicious appearing pulmonary nodule or mass identified. Upper Abdomen: Aortic atherosclerosis. Musculoskeletal: There are no aggressive appearing lytic or blastic lesions noted in the visualized portions of the skeleton. IMPRESSION: 1. Complex right-sided pleural gas and fluid collection which could represent a hemopneumothorax or empyema. Extensive passive atelectasis in the right lung. 2. Cardiomegaly. 3. Aortic atherosclerosis, in addition to left main and three-vessel coronary artery disease. Assessment for potential risk factor modification, dietary therapy or pharmacologic therapy may be warranted, if clinically indicated. 4. Dilatation of the pulmonic trunk (4.0 cm in diameter), concerning for pulmonary arterial hypertension. Aortic Atherosclerosis (ICD10-I70.0).  Electronically Signed   By: Toribio Aye M.D.   On: 10/02/2023 08:57       Banessa Mao M.D. [facility] 10/03/2023, 12:12 PM  Available via Epic secure chat 7am-7pm After 7 pm, please refer to night coverage provider listed on amion.

## 2023-10-04 ENCOUNTER — Inpatient Hospital Stay (HOSPITAL_COMMUNITY): Payer: Medicare HMO

## 2023-10-04 ENCOUNTER — Other Ambulatory Visit: Payer: Self-pay | Admitting: Internal Medicine

## 2023-10-04 DIAGNOSIS — J9601 Acute respiratory failure with hypoxia: Secondary | ICD-10-CM | POA: Diagnosis not present

## 2023-10-04 DIAGNOSIS — N179 Acute kidney failure, unspecified: Secondary | ICD-10-CM | POA: Diagnosis not present

## 2023-10-04 DIAGNOSIS — J942 Hemothorax: Secondary | ICD-10-CM | POA: Diagnosis not present

## 2023-10-04 DIAGNOSIS — I480 Paroxysmal atrial fibrillation: Secondary | ICD-10-CM

## 2023-10-04 DIAGNOSIS — K921 Melena: Secondary | ICD-10-CM | POA: Diagnosis not present

## 2023-10-04 DIAGNOSIS — D62 Acute posthemorrhagic anemia: Secondary | ICD-10-CM | POA: Diagnosis not present

## 2023-10-04 DIAGNOSIS — N1831 Chronic kidney disease, stage 3a: Secondary | ICD-10-CM | POA: Diagnosis not present

## 2023-10-04 LAB — BASIC METABOLIC PANEL
Anion gap: 9 (ref 5–15)
BUN: 38 mg/dL — ABNORMAL HIGH (ref 8–23)
CO2: 22 mmol/L (ref 22–32)
Calcium: 8.2 mg/dL — ABNORMAL LOW (ref 8.9–10.3)
Chloride: 109 mmol/L (ref 98–111)
Creatinine, Ser: 1.45 mg/dL — ABNORMAL HIGH (ref 0.61–1.24)
GFR, Estimated: 51 mL/min — ABNORMAL LOW (ref >60)
Glucose, Bld: 95 mg/dL (ref 70–99)
Potassium: 4 mmol/L (ref 3.5–5.1)
Sodium: 140 mmol/L (ref 135–145)

## 2023-10-04 LAB — PROCALCITONIN: Procalcitonin: <0.1 ng/mL

## 2023-10-04 LAB — CBC
HCT: 27.2 % — ABNORMAL LOW (ref 39.0–52.0)
Hemoglobin: 8.2 g/dL — ABNORMAL LOW (ref 13.0–17.0)
MCH: 30.5 pg (ref 26.0–34.0)
MCHC: 30.1 g/dL (ref 30.0–36.0)
MCV: 101.1 fL — ABNORMAL HIGH (ref 80.0–100.0)
Platelets: 153 10*3/uL (ref 150–400)
RBC: 2.69 MIL/uL — ABNORMAL LOW (ref 4.22–5.81)
RDW: 15.5 % (ref 11.5–15.5)
WBC: 5.7 10*3/uL (ref 4.0–10.5)
nRBC: 0 % (ref 0.0–0.2)

## 2023-10-04 LAB — PROTIME-INR
INR: 1.2 (ref 0.8–1.2)
Prothrombin Time: 15.7 s — ABNORMAL HIGH (ref 11.4–15.2)

## 2023-10-04 MED ORDER — ISOSORBIDE MONONITRATE ER 60 MG PO TB24
60.0000 mg | ORAL_TABLET | Freq: Every day | ORAL | Status: DC
  Administered 2023-10-05 – 2023-10-06 (×2): 60 mg via ORAL
  Filled 2023-10-04 (×2): qty 1

## 2023-10-04 MED ORDER — QUETIAPINE FUMARATE 50 MG PO TABS
25.0000 mg | ORAL_TABLET | Freq: Every day | ORAL | Status: DC
  Administered 2023-10-04 – 2023-10-05 (×2): 25 mg via ORAL
  Filled 2023-10-04 (×2): qty 1

## 2023-10-04 MED ORDER — SODIUM CHLORIDE 0.9% FLUSH
10.0000 mL | INTRAVENOUS | Status: DC | PRN
  Administered 2023-10-04: 10 mL

## 2023-10-04 MED ORDER — MIDAZOLAM HCL 2 MG/2ML IJ SOLN
INTRAMUSCULAR | Status: AC
Start: 2023-10-04 — End: ?
  Filled 2023-10-04: qty 4

## 2023-10-04 MED ORDER — HYDRALAZINE HCL 20 MG/ML IJ SOLN: 10.0000 mg | Freq: Four times a day (QID) | INTRAMUSCULAR | Status: DC | PRN

## 2023-10-04 MED ORDER — FENTANYL CITRATE (PF) 100 MCG/2ML IJ SOLN
INTRAMUSCULAR | Status: AC
  Filled 2023-10-04: qty 2

## 2023-10-04 MED ORDER — SODIUM CHLORIDE 0.9% FLUSH
10.0000 mL | Freq: Two times a day (BID) | INTRAVENOUS | Status: DC
  Administered 2023-10-05 – 2023-10-24 (×35): 10 mL via INTRAVENOUS

## 2023-10-04 MED ORDER — MIDAZOLAM HCL 2 MG/2ML IJ SOLN
INTRAMUSCULAR | Status: AC | PRN
  Administered 2023-10-04 (×2): 1 mg via INTRAVENOUS

## 2023-10-04 MED ORDER — FENTANYL CITRATE (PF) 100 MCG/2ML IJ SOLN
INTRAMUSCULAR | Status: AC | PRN
  Administered 2023-10-04 (×2): 50 ug via INTRAVENOUS

## 2023-10-04 NOTE — Progress Notes (Signed)
 [facility]                                                                              Joshua [name], is a 73 y.o. male, DOB - 1951-01-06, FMW:[id number] Admit date - 09/24/2023    Outpatient Primary MD for the patient is Yolande Toribio MATSU, MD  LOS - 10  days  Chief Complaint  Patient presents with   Weakness   Dizziness   Shortness of Breath       Brief summary   Patient is a 73 year old M with PMH of PAF/flutter on Pradaxa , CAD s/p stents in 2009, DM-2, CKD-3A, OSA not on CPAP, HTN, HLD and osteoarthritis presenting with progressive generalized weakness, dizziness, dyspnea, melena and edema for weeks, and admitted with symptomatic anemia in the setting of GI bleed, AKI with uremia and hyponatremia.   In ED, vitals stable. Na 128. Cr 4.45 (1.4 on 8/13).  BUN 126.  Bicarb 15.  AG 12.  Hgb 7.0.  Platelets 148.  UA with microscopic hematuria.  One units of blood ordered.  Started on IV Protonix .  GI consulted.   EGD delayed due to elevated INR. IV vitamin K  and Praxibind on 12/28 and 12/30.  Also received FFP on 12/29.  Received 2 units of PRBC so far. EGD on 1/1 with 2 gastric AVMs, 1 with intermittent oozing and treated with APC.  GI recommended p.o. Protonix  40 mg twice daily for 4 weeks followed by 40 mg daily indefinitely.    Patient had an episode of increased oxygen requirement in the evening of 12/30.  Chest x-ray showed loculated right moderate pleural effusion.  PCCM consulted.  Thoracentesis yielded 150 cc dark bloody appearing fluid.  Follow-up CT chest with large right loculated pleural effusion concerning for clotted hemothorax.  PCCM placed large bore chest tube on 01/01.  TTE without significant finding. AKI improving with IV fluid.  Renal US  without acute finding.     Assessment & Plan    Principal Problem: Symptomatic ABLA due to upper GIB, right hemothorax: -Patient was on Pradaxa  for A-fib/flutter. Hgb 7.0 (was 13.1 on 6/20).  Had melena for  over 2 weeks.  Was symptomatic.  S/p IV vitamin K  and Praxibind on 12/28 and 12/30.  Also received FFP on 12/29.  Received 2 units of PRBC. -  EGD on 1/1 with 2 gastric AVMs, 1 with intermittent oozing and treated with APC.  GI recommended p.o. Protonix  40 mg twice daily for 4 weeks followed by 40 mg daily indefinitely.  -Continue to hold Pradaxa . -Patient follows cardiology, Dr. Waddell, outpatient, will need recommendations regarding anticoagulation once right-sided hemothorax is resolved.   Right-sided hemothorax: - Likely due to Pradaxa , INR supratherapeutic 9.5 on 12/27, reversed with FFP, Praxbind  --S/p thoracocentesis with removal of 150 cc dark bloody culture-negative exudative and neutrophilic fluid. -s/p right chest tube on 1/1.   -CT chest 1/4 showed a complex right-sided pleural gas and fluid collection could represent hemopneumothorax or empyema, extensive past several atelectasis in the right lung. -Continue chest tube, management per PCCM   Acute respiratory failure with hypoxia:  - Due to OSA, atelectasis, hemothorax and  nasal blockage from dry blood -O2 sat stable, 98 to 100% on 3 L O2 via [place]   Acute delirium, sundowning -On 1/5, needed wrist restraints and mittens due to interference with medical interventions.   - Confused -Was placed on Sinemet  on 1/5, started on Seroquel  25 mg nightly     AKI on CKD-3A/uremia:  - Cr 1.4 in 04/2023.  ATN from anemia, on Benicar  at home.  Denied NSAID use.  UA with hematuria. -   Renal US  without significant finding. -Creatinine improving     Diabetes mellitus type 2, NIDDM with CKD-3A:  -A1c 5.5%.  CBG within normal range.       Essential hypertension:  - 2D echo 12/28 showed EF of 55 to 60%, no regional WMA  -Continue Coreg  -BP somewhat elevated, add hydralazine  IV as needed with parameters, increase Imdur  to 60 mg daily   History of CAD s/p stent in 2009:  -Resumed home Coreg  at reduced dose.  Resume Imdur . -Losartan   on hold -CT chest reviewed, patient has a known history of CAD status post PCI in 2009, currently has no anginal symptoms, continue Coreg , Imdur .    Paroxysmal atrial fibrillation -  Per chart review, patient was switched from warfarin to Pradaxa  in 2011.   -Continue Coreg  -Continue to hold Pradaxa .   -Once cleared from GI standpoint and chest tube removed, will need cardiology recommendations regarding anticoagulation.  Patient follows Dr. Waddell.    Non-anion gap metabolic acidosis: - Resolved.   Hyponatremia:  - Resolved.   Epistaxis: Chronic issue likely from Pradaxa . - Resolved.  -S/p  Praxibind on 12/28 and vitamin K  on 12/28 and 12/30, and FFP on 12/29 -H&H stable   Gross hematuria: Likely due to coagulopathy.  Resolving. -Dr Kathrin discussed with urology, Dr. Watt on 12/30, was recommended outpatient follow-up.    Thrombocytopenia:  -Resolved.   Physical deconditioning -PT/OT-recommended CIR   Hyperlipidemia -Continue rosuvastatin     Elevated INR: 9.5 on 12/28>> 1.5 -S/p Praxbind , vitamin K  and FFP.   Morbid obesity:  Estimated body mass index is 38.01 kg/m as calculated from the following:   Height as of this encounter: 5' 8 (1.727 m).   Weight as of this encounter: 113.4 kg.  Code Status: Full code DVT Prophylaxis:  SCDs Start: 09/24/23 1503   Level of Care: Level of care: Stepdown Family Communication: Updated patient's wife on the phone on 10/03/2023, no family member at the bedside Disposition Plan:      Remains inpatient appropriate:      Procedures:  2D echo 12/31-right thoracocentesis with removal of 150 cc dark bloody appearing fluid 1/1-right chest tube by pulmonology 1/2-EGD with 2 gastric AVMs, 1 of which oozing and treated with APC's  Consultants:   PCCM GI  Antimicrobials:   Anti-infectives (From admission, onward)    None          Medications  carbidopa -levodopa   1 tablet Oral TID   carvedilol   3.125 mg Oral BID WC    Chlorhexidine  Gluconate Cloth  6 each Topical Daily   cyanocobalamin   500 mcg Oral Daily   isosorbide  mononitrate  30 mg Oral Daily   pantoprazole  (PROTONIX ) IV  40 mg Intravenous Q12H   QUEtiapine   25 mg Oral QHS   sodium chloride  flush  3 mL Intravenous Q12H   sodium chloride  flush  40 mL Intrapleural Q8H   tamsulosin   0.4 mg Oral Daily      Subjective:   Joshua [name] was seen and examined  today morning.  Somewhat confused, mittens on, keeping his eyes closed.  Follows commands.  Difficult to obtain ROS from the patient.  No fevers or chills, chest pain or acute shortness of breath.  Objective:   Vitals:   10/04/23 0400 10/04/23 0500 10/04/23 0600 10/04/23 0800  BP: (!) 165/70  (!) 128/52 (!) 160/96  Pulse: 73  60 81  Resp: (!) 22  19 (!) 25  Temp:    98.4 F (36.9 C)  TempSrc:    Axillary  SpO2: 100%  99% 98%  Weight:  113.4 kg    Height:        Intake/Output Summary (Last 24 hours) at 10/04/2023 1117 Last data filed at 10/04/2023 0544 Gross per 24 hour  Intake 740 ml  Output 1390 ml  Net -650 ml     Wt Readings from Last 3 Encounters:  10/04/23 113.4 kg  04/27/23 115.2 kg  03/18/23 119.9 kg    Physical Exam General: Alert and oriented x self, NAD, ill-appearing Cardiovascular: S1 S2 clear, RRR.  Respiratory: CTAB Gastrointestinal: Soft, nontender, nondistended, NBS Ext: 1+ pedal edema bilaterally Neuro: Global weakness, oriented to self Psych: flat affect   Data Reviewed:  I have personally reviewed following labs    CBC Lab Results  Component Value Date   WBC 5.7 10/04/2023   RBC 2.69 (L) 10/04/2023   HGB 8.2 (L) 10/04/2023   HCT 27.2 (L) 10/04/2023   MCV 101.1 (H) 10/04/2023   MCH 30.5 10/04/2023   PLT 153 10/04/2023   MCHC 30.1 10/04/2023   RDW 15.5 10/04/2023   LYMPHSABS 0.8 03/18/2023   MONOABS 0.4 03/18/2023   EOSABS 0.3 03/18/2023   BASOSABS 0.0 03/18/2023     Last metabolic panel Lab Results  Component Value Date   NA 140  10/04/2023   K 4.0 10/04/2023   CL 109 10/04/2023   CO2 22 10/04/2023   BUN 38 (H) 10/04/2023   CREATININE 1.45 (H) 10/04/2023   GLUCOSE 95 10/04/2023   GFRNONAA 51 (L) 10/04/2023   GFRAA >60 04/26/2018   CALCIUM  8.2 (L) 10/04/2023   PHOS 2.1 (L) 09/30/2023   PROT 5.7 (L) 10/01/2023   ALBUMIN  2.1 (L) 10/01/2023   LABGLOB 2.3 06/15/2022   BILITOT 0.8 10/01/2023   ALKPHOS 32 (L) 10/01/2023   AST 22 10/01/2023   ALT 11 10/01/2023   ANIONGAP 9 10/04/2023    CBG (last 3)  No results for input(s): GLUCAP in the last 72 hours.     Coagulation Profile: Recent Labs  Lab 09/27/23 1614 09/28/23 0604 09/29/23 0353 09/30/23 0316  INR 2.5* 1.3* 1.7* 1.5*     Radiology Studies: I have personally reviewed the imaging studies  No results found.      Nydia Distance M.D. [facility] 10/04/2023, 11:17 AM  Available via Epic secure chat 7am-7pm After 7 pm, please refer to night coverage provider listed on amion.

## 2023-10-04 NOTE — Telephone Encounter (Signed)
 Prescription refill request for Pradaxa received.  Indication:afib Last office visit:7/24 Weight:113.4  kg Age:73 Scr:1.45  1/25 CrCl:71.69  ml/min  Prescription refilled

## 2023-10-04 NOTE — Procedures (Signed)
 Vascular and Interventional Radiology Procedure Note  Patient: Joshua [name] DOB: 1950/11/16 Medical Record Number: [id number] Note Date/Time: 10/04/23 4:24 PM   Performing Physician: Thom Hall, MD Assistant(s): None  Diagnosis: Loculated RIGHT pleural effusion  Procedure:  RIGHTPERCUTANEOUS THORACOSTOMY DRAINAGE CATHETER PLACEMENT for fibrinolysis  Anesthesia: Conscious Sedation Complications: None Estimated Blood Loss: Minimal Specimens: Sent for Gram Stain, Aerobe Culture, and Anerobe Culture  Findings:  Successful CT-guided placement of 14 F catheter into RIGHT chest.  Plan:  - Fibrinolysis per Pulmonary team. - Chest tube to -20 cm H20 suction. Additional management per Primary.  See detailed procedure note with images in PACS. The patient tolerated the procedure well without incident or complication and was returned to Recovery in stable condition.    Thom Hall, MD [facility] Radiology   Pager. [phone] Clinic. [phone]

## 2023-10-04 NOTE — Progress Notes (Addendum)
 Referring Physician(s): Kassie PARAS  Supervising Physician: Hughes Simmonds  Patient Status:  [facility] - In-pt  Chief Complaint: Complex right pleural fluid collection   Subjective: Patient known to IR team from bone marrow biopsy on 08/07/22.  He is a 73 year old male with past medical history significant for PAF/flutter on Pradaxa , coronary artery disease with prior MI/stenting in 2009, diabetes, chronic kidney disease, sleep apnea, gout, hypertension, hyperlipidemia, osteoarthritis who was admitted to [facility] on 09/24/2023 with progressive weakness, dizziness, dyspnea, melena ,edema, anemia/uremia and hyponatremia.  EGD on 09/29/23 revealed 2 gastric AVMs 1 of which was treated with APC.  Due to worsening dyspnea patient underwent chest x-ray showing loculated right pleural effusion.  CCM was consulted and performed thoracentesis yielding 150 cc of dark bloody fluid.  Follow-up CT chest revealed large right loculated pleural effusion concerning for clotted hemothorax.  Critical care placed large bore right chest tube on 09/29/23.  Follow-up CT chest on 1/4 showed the tube was extending into the right major fissure along with persistent air/fluid collection.  Tube was subsequently pulled back slightly but still no significant output.  Following discussion with thoracic surgery request now received for CT-guided drainage of right pleural fluid collection.  Today the previously placed large bore right chest tube was removed by CCM.  Patient currently afebrile,WBC normal, hemoglobin 8.2, platelets normal, creatinine 1.45, PT/INR pend, pleural fl cx /cyt neg. Pt/family currently deny fever, chest pain. He has had occ dry heave, some dyspnea; pt says yes to all questions; remains confused;keeping eyes closed     Past Medical History:  Diagnosis Date   Arthritis    Atrial fibrillation (HCC)    Atrial flutter (HCC)    Back pain    CAD (coronary artery disease)    stents placed 2009    Complication of anesthesia    woke up during surgery and ablation   DM (diabetes mellitus) (HCC)    type 2   Elevated PSA    Gout    Hearing problem    HTN (hypertension)    Hyperlipidemia    Myocardial infarct, old 2010   Prostatitis    Renal insufficiency    Past Surgical History:  Procedure Laterality Date   CARDIAC CATHETERIZATION  11/07/07   with 2 stents   COLONOSCOPY     Electrophysiologic study and RF catheter ablation   of AV node reentrant tachycardia.     ESOPHAGOGASTRODUODENOSCOPY (EGD) WITH PROPOFOL  N/A 09/30/2023   Procedure: ESOPHAGOGASTRODUODENOSCOPY (EGD) WITH PROPOFOL ;  Surgeon: Charlanne Groom, MD;  Location: [facility] ENDOSCOPY;  Service: Gastroenterology;  Laterality: N/A;   EXTERNAL EAR SURGERY Right    cartilage   HOT HEMOSTASIS N/A 09/30/2023   Procedure: HOT HEMOSTASIS (ARGON PLASMA COAGULATION/BICAP);  Surgeon: Charlanne Groom, MD;  Location: [facility];  Service: Gastroenterology;  Laterality: N/A;   LEFT AND RIGHT HEART CATHETERIZATION WITH CORONARY/GRAFT ANGIOGRAM N/A 09/27/2014   Procedure: LEFT AND RIGHT HEART CATHETERIZATION WITH EL BILE;  Surgeon: Victory LELON Claudene DOUGLAS, MD;  Location: [facility] CATH LAB;  Service: Cardiovascular;  Laterality: N/A;   LUMBAR LAMINECTOMY/DECOMPRESSION MICRODISCECTOMY Left 04/29/2018   Procedure: Left Lumbar Four-Five Laminectomy/Foraminotomy;  Surgeon: Joshua Alm RAMAN, MD;  Location: [facility] OR;  Service: Neurosurgery;  Laterality: Left;  Left Lumbar Four-Five Laminectomy/Foraminotomy   TOOTH EXTRACTION     VASECTOMY       Allergies: Desipramine  and Niacin  Medications: Prior to Admission medications   Medication Sig Start Date End Date Taking? Authorizing Provider  carvedilol  (COREG ) 12.5 MG  tablet TAKE 1 TABLET BY MOUTH TWICE A DAY 06/14/23  Yes Waddell Danelle ORN, MD  dabigatran  (PRADAXA ) 150 MG CAPS capsule TAKE 1 CAPSULE BY MOUTH TWICE A DAY 08/25/23  Yes Waddell Danelle ORN, MD  fenofibrate 160 MG tablet Take 160 mg by mouth  daily. 06/16/20  Yes [provider]  ipratropium (ATROVENT) 0.06 % nasal spray Place 1 spray into both nostrils 3 (three) times daily as needed for rhinitis. 05/29/19  Yes [provider]  isosorbide  mononitrate (IMDUR ) 30 MG 24 hr tablet Take 30 mg by mouth daily. 03/11/23  Yes [provider]  nitroGLYCERIN  (NITROSTAT ) 0.4 MG SL tablet Place 0.4 mg under the tongue every 5 (five) minutes x 3 doses as needed for chest pain.   Yes [provider]  olmesartan  (BENICAR ) 40 MG tablet Take 1 tablet (40 mg total) by mouth daily. 04/27/23  Yes Waddell Danelle ORN, MD  pioglitazone  (ACTOS ) 15 MG tablet Take 15 mg by mouth daily.   Yes [provider]  rosuvastatin  (CRESTOR ) 20 MG tablet Take 20 mg by mouth daily. 03/30/20  Yes [provider]  TYLENOL  8 HOUR ARTHRITIS PAIN 650 MG CR tablet Take 650-1,300 mg by mouth every 8 (eight) hours as needed for pain.   Yes [provider]  vitamin B-12 (CYANOCOBALAMIN ) 500 MCG tablet Take 500 mcg by mouth daily.   Yes [provider]     Vital Signs: BP (!) 160/96 (BP Location: Right Arm)   Pulse 81   Temp 98.4 F (36.9 C) (Axillary)   Resp (!) 25   Ht 5' 8 (1.727 m)   Wt 250 lb (113.4 kg)   SpO2 98%   BMI 38.01 kg/m   Physical Exam: pt with eyes closed; remains confused, oriented to self; says yes to all questions; family in room; hands in mittens; chest- dim BS rt base, clear on left; heart- nl rate, ectopy noted; abd-soft,+BS, no wincing with palpation; bilat pretibial edema noted  Imaging: DG CHEST PORT 1 VIEW Result Date: 10/02/2023 CLINICAL DATA:  Chest tube in place.  Follow-up pneumothorax. EXAM: PORTABLE CHEST 1 VIEW COMPARISON:  10/01/2023 FINDINGS: Right lower chest tube is stable in position from previous exam. Right apical pneumothorax measures 1.6 cm over the apex. This is unchanged from previous exam. Asymmetric opacification within the right mid and lower lung is unchanged in  the interval. Left lung is clear. IMPRESSION: 1. Stable right apical pneumothorax. 2. Stable right chest tube. 3. No change in aeration to the right mid and lower lung. Electronically Signed   By: Waddell Calk M.D.   On: 10/02/2023 10:46   CT CHEST WO CONTRAST Result Date: 10/02/2023 CLINICAL DATA:  73 year old male with suspected hemopneumothorax. EXAM: CT CHEST WITHOUT CONTRAST TECHNIQUE: Multidetector CT imaging of the chest was performed following the standard protocol without IV contrast. RADIATION DOSE REDUCTION: This exam was performed according to the departmental dose-optimization program which includes automated exposure control, adjustment of the mA and/or kV according to patient size and/or use of iterative reconstruction technique. COMPARISON:  Chest CT 09/29/2023. FINDINGS: Cardiovascular: Heart size is mildly enlarged. There is no significant pericardial fluid, thickening or pericardial calcification. There is aortic atherosclerosis, as well as atherosclerosis of the great vessels of the mediastinum and the coronary arteries, including calcified atherosclerotic plaque in the left main, left anterior descending, left circumflex and right coronary arteries. Calcifications of the mitral annulus. Dilatation of the pulmonic trunk (4.0 cm in diameter). Mediastinum/Nodes: No pathologically enlarged mediastinal  or hilar lymph nodes. Please note that accurate exclusion of hilar adenopathy is limited on noncontrast CT scans. Esophagus is unremarkable in appearance. No axillary lymphadenopathy. Lungs/Pleura: Right-sided chest tube noted extending into the right major fissure. Large volume of complex right-sided pleural fluid, with multiple internal locules of gas, many of which are not non dependent, suggesting loculated pleural effusion (potentially hemopneumothorax or empyema). Passive atelectasis in the right lung, most severe in the right lower lobe. No confluent consolidative airspace disease. No left  pleural effusion. No definite suspicious appearing pulmonary nodule or mass identified. Upper Abdomen: Aortic atherosclerosis. Musculoskeletal: There are no aggressive appearing lytic or blastic lesions noted in the visualized portions of the skeleton. IMPRESSION: 1. Complex right-sided pleural gas and fluid collection which could represent a hemopneumothorax or empyema. Extensive passive atelectasis in the right lung. 2. Cardiomegaly. 3. Aortic atherosclerosis, in addition to left main and three-vessel coronary artery disease. Assessment for potential risk factor modification, dietary therapy or pharmacologic therapy may be warranted, if clinically indicated. 4. Dilatation of the pulmonic trunk (4.0 cm in diameter), concerning for pulmonary arterial hypertension. Aortic Atherosclerosis (ICD10-I70.0). Electronically Signed   By: Toribio Aye M.D.   On: 10/02/2023 08:57   DG CHEST PORT 1 VIEW Result Date: 10/01/2023 CLINICAL DATA:  Pneumothorax EXAM: PORTABLE CHEST 1 VIEW COMPARISON:  X-ray 09/30/2023 and older FINDINGS: Stable right chest tube with a tiny right apical pneumothorax. Patchy right lung opacities are decreasing with significant residual. Elevation of the right hemidiaphragm. Stable subtle opacity left lung base. No left-sided pneumothorax. No edema. Stable cardiopericardial silhouette. Overlapping cardiac leads. Films are under penetrated. IMPRESSION: Stable right chest tube and small right apical pneumothorax. Decreasing patchy right lung opacity slightly with significant residual. Electronically Signed   By: Ranell Bring M.D.   On: 10/01/2023 14:14    Labs:  CBC: Recent Labs    09/30/23 0316 10/01/23 0308 10/02/23 0329 10/04/23 0333  WBC 5.1 5.7 6.9 5.7  HGB 8.7* 9.5* 8.7* 8.2*  HCT 29.3* 30.9* 29.0* 27.2*  PLT 169 151 201 153    COAGS: Recent Labs    09/26/23 1710 09/27/23 0735 09/27/23 1614 09/28/23 0604 09/28/23 1910 09/29/23 0353 09/30/23 0316  INR  --    < > 2.5*  1.3*  --  1.7* 1.5*  APTT 89*  --  78* 36 48*  --   --    < > = values in this interval not displayed.    BMP: Recent Labs    09/30/23 0316 10/01/23 0308 10/02/23 0329 10/04/23 0333  NA 139 142 139 140  K 4.1 3.9 3.9 4.0  CL 107 108 107 109  CO2 25 25 24 22   GLUCOSE 117* 98 112* 95  BUN 45* 37* 41* 38*  CALCIUM  8.4* 8.3* 8.1* 8.2*  CREATININE 2.03* 1.75* 1.69* 1.45*  GFRNONAA 34* 40* 42* 51*    LIVER FUNCTION TESTS: Recent Labs    03/18/23 1026 09/24/23 1227 09/25/23 0317 09/26/23 0242 09/28/23 0604 09/28/23 1112 09/29/23 0353 09/30/23 0316 10/01/23 0308  BILITOT 0.8 1.0 1.0  --   --   --   --   --  0.8  AST 29 23 20   --   --   --   --   --  22  ALT 12 12 11   --   --   --   --   --  11  ALKPHOS 40 30* 27*  --   --   --   --   --  32*  PROT 6.3* 5.7* 5.2*  --   --  <3.0*  --   --  5.7*  ALBUMIN  3.5 2.3* 2.1*   < > 2.4*  --  2.3* 2.3* 2.1*   < > = values in this interval not displayed.    Assessment and Plan: 73 year old male with past medical history significant for PAF/flutter on Pradaxa , coronary artery disease with prior MI/stenting in 2009, diabetes, chronic kidney disease, sleep apnea, gout, hypertension, hyperlipidemia, osteoarthritis who was admitted to [facility] on 09/24/2023 with progressive weakness, dizziness, dyspnea, melena ,edema, anemia/uremia and hyponatremia.  EGD on 09/29/23 revealed 2 gastric AVMs 1 of which was treated with APC.  Due to worsening dyspnea patient underwent chest x-ray showing loculated right pleural effusion.  CCM was consulted and performed thoracentesis yielding 150 cc of dark bloody fluid.  Follow-up CT chest revealed large right loculated pleural effusion concerning for clotted hemothorax.  Critical care placed large bore right chest tube on 09/29/23.  Follow-up CT chest on 1/4 showed the tube was extending into the right major fissure along with persistent air/fluid collection.  Tube was subsequently pulled back slightly but  still no significant output.  Following discussion with thoracic surgery request now received for CT-guided drainage of right pleural fluid collection.  Today the previously placed large bore right chest tube was removed by CCM.  Patient currently afebrile, having some acute delirium,  WBC normal, hemoglobin 8.2, platelets normal, creatinine 1.45, PT/INR pend, pleural fl cx /cyt neg. Latest imaging studies have been reviewed by Dr. Hughes.Risks and benefits discussed with the pt/ spouse/daughter including bleeding, infection, damage to adjacent structures, pneumothorax and sepsis.  All of the patient's questions were answered, patient is agreeable to proceed. Consent signed and in chart.  Procedure tent scheduled for this afternoon  Electronically Signed: D. Franky Rakers, PA-C 10/04/2023, 11:55 AM   I spent a total of 25 minutes at the the patient's bedside AND on the patient's hospital floor or unit, greater than 50% of which was counseling/coordinating care for CT guided right chest drain placement   Patient ID: KORTLAND NICHOLS, male   DOB: [birth year], 73 y.o.   MRN: [id number]

## 2023-10-04 NOTE — Plan of Care (Signed)
  Problem: Clinical Measurements: Goal: Will remain free from infection Outcome: Progressing Goal: Respiratory complications will improve Outcome: Progressing Goal: Cardiovascular complication will be avoided Outcome: Progressing   Problem: Pain Management: Goal: General experience of comfort will improve Outcome: Progressing

## 2023-10-04 NOTE — Plan of Care (Signed)
  Problem: Education: Goal: Knowledge of General Education information will improve Description: Including pain rating scale, medication(s)/side effects and non-pharmacologic comfort measures Outcome: Not Progressing   Problem: Nutrition: Goal: Adequate nutrition will be maintained Outcome: Not Progressing   Problem: Safety: Goal: Ability to remain free from injury will improve Outcome: Not Progressing

## 2023-10-04 NOTE — Progress Notes (Signed)
 Occupational Therapy Treatment Patient Details Name: Joshua [name] MRN: [id number] DOB: Nov 13, 1950 Today's Date: 10/04/2023   History of present illness Patient is a 73 year old male who presented with SOB, melena and dizziness. Patient was admitted with symptomatic anemia secondary to GI bleed and AKI. Patient has thoracentesis on 12/31. PMH: a fib, CKD III, HTN, HLD, OSA, OA, CPAP.   OT comments  Patient was noted to have had a change in status with patient more lethargic in ICU with chest tube in place. Patient was +2 for bed mobility with limited patient engagement in tasks with family present attempting to encourage. Patient was unable to transition into standing today with multiple attempts. Patient will benefit from continued inpatient follow up therapy, <3 hours/day. Patient's discharge plan remains appropriate at this time. OT will continue to follow acutely.        If plan is discharge home, recommend the following:  Assistance with cooking/housework;Direct supervision/assist for medications management;Assist for transportation;Help with stairs or ramp for entrance;Direct supervision/assist for financial management;Two people to help with walking and/or transfers;Two people to help with bathing/dressing/bathroom;Assistance with feeding;Supervision due to cognitive status   Equipment Recommendations  BSC/3in1       Precautions / Restrictions Precautions Precautions: Other (comment);Fall Precaution Comments: chest tube Restrictions Weight Bearing Restrictions Per Provider Order: No       Mobility Bed Mobility Overal bed mobility: Needs Assistance Bed Mobility: Rolling, Sidelying to Sit, Sit to Supine Rolling: Total assist, +2 for physical assistance, +2 for safety/equipment, Used rails Sidelying to sit: +2 for physical assistance, +2 for safety/equipment, Used rails Supine to sit: Total assist, +2 for physical assistance, +2 for safety/equipment     General bed mobility  comments: Patient required hand over hand support  for all movements, to move legs and reach for rail. patient did assist to scoot  to bed edge x 2.       Balance Overall balance assessment: Needs assistance Sitting-balance support: Feet supported, No upper extremity supported Sitting balance-Leahy Scale: Poor           ADL either performed or assessed with clinical judgement   ADL Overall ADL's : Needs assistance/impaired     Grooming: Total assistance Grooming Details (indicate cue type and reason): attempted to participate in combing hair sitting EOB with HOH needed for reaching up to top of head. patient not engaged in task.       General ADL Comments: patient was more lethargic during session with daughter and wife hyper involved in session.      Cognition Arousal: Lethargic Behavior During Therapy: Flat affect Overall Cognitive Status: Impaired/Different from baseline Area of Impairment: Attention, Safety/judgement, Problem solving, Awareness                   Current Attention Level: Focused     Safety/Judgement: Decreased awareness of deficits     General Comments: keeps  eyes closed, answers yes to all questions, delayed  FC, family present                   Pertinent Vitals/ Pain       Pain Assessment Pain Assessment: No/denies pain         Frequency  Min 1X/week        Progress Toward Goals  OT Goals(current goals can now be found in the care plan section)  Progress towards OT goals: Not progressing toward goals - comment (patient had a medical change with transition to ICU after  EGD proceecure. chest tube in place)     Plan      Co-evaluation      Reason for Co-Treatment: Complexity of the patient's impairments (multi-system involvement);Necessary to address cognition/behavior during functional activity;To address functional/ADL transfers PT goals addressed during session: Mobility/safety with mobility OT goals addressed  during session: ADL's and self-care      AM-PAC OT 6 Clicks Daily Activity     Outcome Measure   Help from another person eating meals?: Total (NPO) Help from another person taking care of personal grooming?: Total Help from another person toileting, which includes using toliet, bedpan, or urinal?: Total Help from another person bathing (including washing, rinsing, drying)?: Total Help from another person to put on and taking off regular upper body clothing?: Total Help from another person to put on and taking off regular lower body clothing?: Total 6 Click Score: 6    End of Session Equipment Utilized During Treatment: Rolling walker (2 wheels)  OT Visit Diagnosis: Unsteadiness on feet (R26.81);Muscle weakness (generalized) (M62.81)   Activity Tolerance Patient limited by fatigue   Patient Left with call bell/phone within reach;in bed;with bed alarm set;with family/visitor present   Nurse Communication Mobility status        Time: 8951-8887 OT Time Calculation (min): 24 min  Charges: OT General Charges $OT Visit: 1 Visit OT Treatments $Therapeutic Activity: 8-22 mins  Joshua LEYLAND, MS Acute Rehabilitation Department Office# [phone]   Joshua [name] 10/04/2023, 2:10 PM

## 2023-10-04 NOTE — Progress Notes (Addendum)
 NAME:  Joshua [name], MRN:  [id number], DOB:  1951/09/26, LOS: 10 ADMISSION DATE:  09/24/2023, CONSULTATION DATE:  09/28/23 REFERRING MD:  Mignon Bump, MD, CHIEF COMPLAINT:  Pleural effusion   History of Present Illness:  73 year old male with AF/flutter on Pradaxa , CAD s/p stents in 2009, DM2, CKD IIIA, HTN, HLD, OA OSA not on CPAP with presented with weakness, dizziness, SOB and melena admitted to [facility] for symptomatic anemia secondary to GI bleed and AKI. Since hospitalization he has received IVF, PRBC x 2U, PPI, vitamin K , praxibind and FFP. Planned for EGD today or tomorrow.  Yesterday had increased O2 requirement to 5L associated with epistaxis. Seen by [facility] and improved to 2L via [place] after blowing out nasal blood clots. CXR demonstrated loculated right pleural effusion. PCCM consulted for evaluation for thoracentesis.  Has shortness of breath with movement. Improved with oxygen. Denies past history of pleural effusion or need for thoracentesis  Pertinent  Medical History  AF/flutter on Pradaxa , CAD s/p stents in 2009, DM2, CKD IIIA, HTN, HLD, OA OSA not on CPAP  Significant Hospital Events: Including procedures, antibiotic start and stop dates in addition to other pertinent events   S/p thoracentesis 09/28/23 with 150 cc blood fluid removed 1/1 large bore  Chest CT with complex right pleura gas and fluid collection and atelectasis of right lung. Right chest tube extending into right major fissues1/4 Chest tube  1/4 CT pulled back. Still not much output 1/6 CT removed. Spoke w/ Thoracic surg. Rec'd CT guided CT    Interim History / Subjective:  Still hasn't drained much fluid. More confused.  Objective   Blood pressure (!) 128/52, pulse 60, temperature 98.1 F (36.7 C), temperature source Oral, resp. rate 19, height 5' 8 (1.727 m), weight 113.4 kg, SpO2 99%.        Intake/Output Summary (Last 24 hours) at 10/04/2023 0813 Last data filed at 10/04/2023 0544 Gross per 24 hour  Intake  1000 ml  Output 1390 ml  Net -390 ml   Filed Weights   09/24/23 1645 10/03/23 0442 10/04/23 0500  Weight: 111 kg 112.8 kg 113.4 kg   Physical Exam: General this is a debilitated 73 year old male who is laying in bed. He is not in distress but he his affect is flat and at times short HENT NCAT no JVD Pulm decreased t/o. Right CT has drained about 100cc over last 24 hrs. No airleak Card rrr Abd soft Ext dep edema Neuro awake. Oriented x 1. Does not open eyes. Has freq tremor/pill rolling of both hands more so w/ agitation and stimulation do not really see intention tremor. He has generalized weakness. Will follow commands but really withdrawn and does not interact much  Resolved Hospital Problem list   N/A  Assessment & Plan:  Acute hypoxic resp failure  epistaxis (resolved) Aspiration PNA w/ R basilar ASD  hemothorax (spont 2/2 coagulopathy) Small R apical ptx  Acute on chronic renal failure CKD stage IIIB H/o CAD HTN DM type II Acute delirium    Pulm prob list  Acute hypoxic resp failure 2/2 epistaxis and resultant aspiration, spont hemothorax (INR was 9.5) still w/ complicated right pleural space w/ on-going posterior fluid collection  -recent elevated INR. S/p thora 12/31 and large bore chest tube 1/1  -no malignant cells on cyto  -1/4 Right chest tube withdrawn 5cm w/out sig change in output. Dr Kassie spoke w/ Dr Shyrl reviewing imaging. Recommended removing CT and having CT guided film  vs VATS. But at this point I worry he would not be a good candidate due to deconditioning  Plan Remove right large bore CT F/u CXR  Will ask IR to see and place new CT in the posterior loculated pleural fluid collection  Ck PCT today  Cont to hold Pradaxa    Acute delirium  -confused. Has pill rolling type movements (acutely) started on sinemet  yesterday  Plan Ck 12 lead for qtc and start seroquel  tonight  Cont supportive care Will decide on weather or not we continue sinemet   pending clinical response to change of focus to delirium    Best Practice (right click and Reselect all SmartList Selections daily)   Diet/type: Regular consistency (see orders) DVT prophylaxis not indicated in setting of bleed Pressure ulcer(s): pressure ulcer assessment deferred  GI prophylaxis: PPI Lines: N/A Foley:  N/A Code Status:  full code Last date of multidisciplinary goals of care discussion [ per primary]   Critical care time: N/A My time 35 min

## 2023-10-04 NOTE — Progress Notes (Signed)
 Physical Therapy Treatment Patient Details Name: Joshua [name] MRN: [id number] DOB: 03-Oct-1950 Today's Date: 10/04/2023   History of Present Illness Patient is a 73 year old male who presented with SOB, melena and dizziness. Patient was admitted with symptomatic anemia secondary to GI bleed and AKI. Patient has thoracentesis on 12/31. PMH: a fib, CKD III, HTN, HLD, OSA, OA, CPAP.    PT Comments  The patient is awake and minimally responds verbally, and states yes to all questions. Patient required  +2 max assistance  to move to sitting, unable to stand. Patient did ambulate 6'   09/28/23. Patient  may require post acute rehab if does not progress to level family can provide  level assistance.   If plan is discharge home, recommend the following: Assistance with cooking/housework;Assist for transportation;Help with stairs or ramp for entrance;Two people to help with walking and/or transfers;Two people to help with bathing/dressing/bathroom   Can travel by private Psychologist, Clinical (4 wheels)    Recommendations for Other Services       Precautions / Restrictions Precautions Precautions: Other (comment);Fall Precaution Comments: chest tube Restrictions Weight Bearing Restrictions Per Provider Order: No     Mobility  Bed Mobility Overal bed mobility: Needs Assistance Bed Mobility: Rolling, Sidelying to Sit, Sit to Supine Rolling: Total assist, +2 for physical assistance, +2 for safety/equipment, Used rails Sidelying to sit: +2 for physical assistance, +2 for safety/equipment, Used rails Supine to sit: Total assist, +2 for physical assistance, +2 for safety/equipment     General bed mobility comments: Patient required hand over hand support  for all movements, to move legs and reach for rail. patient did assist to scoot  to bed edge x 2.    Transfers Overall transfer level: Needs assistance Equipment used: Rolling walker (2 wheels), 2 person  hand held assist   Sit to Stand: From elevated surface, Max assist           General transfer comment: attempted to stand x 2 from bed, patient not clearing buttocks.    Ambulation/Gait                   Stairs             Wheelchair Mobility     Tilt Bed    Modified Rankin (Stroke Patients Only)       Balance Overall balance assessment: Needs assistance Sitting-balance support: Feet supported, No upper extremity supported Sitting balance-Leahy Scale: Poor     Standing balance support: During functional activity, Reliant on assistive device for balance, Bilateral upper extremity supported   Standing balance comment: unable to power up                            Cognition Arousal: Alert Behavior During Therapy: Flat affect Overall Cognitive Status: Impaired/Different from baseline Area of Impairment: Attention, Safety/judgement, Problem solving, Awareness                   Current Attention Level: Focused     Safety/Judgement: Decreased awareness of deficits Awareness: Intellectual   General Comments: keeps  eyes closed, answers yes to all questions, delayed  FC, family present        Exercises      General Comments        Pertinent Vitals/Pain Pain Assessment Pain Assessment: No/denies pain Breathing: occasional labored breathing, short period of hyperventilation Negative  Vocalization: occasional moan/groan, low speech, negative/disapproving quality Facial Expression: sad, frightened, frown Body Language: tense, distressed pacing, fidgeting Consolability: no need to console PAINAD Score: 4    Home Living                          Prior Function            PT Goals (current goals can now be found in the care plan section) Progress towards PT goals: Progressing toward goals    Frequency    Min 1X/week      PT Plan      Co-evaluation PT/OT/SLP Co-Evaluation/Treatment: Yes Reason for  Co-Treatment: Complexity of the patient's impairments (multi-system involvement);Necessary to address cognition/behavior during functional activity;To address functional/ADL transfers PT goals addressed during session: Mobility/safety with mobility OT goals addressed during session: ADL's and self-care      AM-PAC PT 6 Clicks Mobility   Outcome Measure  Help needed turning from your back to your side while in a flat bed without using bedrails?: Total Help needed moving from lying on your back to sitting on the side of a flat bed without using bedrails?: Total Help needed moving to and from a bed to a chair (including a wheelchair)?: Total Help needed standing up from a chair using your arms (e.g., wheelchair or bedside chair)?: Total Help needed to walk in hospital room?: Total Help needed climbing 3-5 steps with a railing? : Total 6 Click Score: 6    End of Session   Activity Tolerance: Treatment limited secondary to medical complications (Comment) Patient left: in bed;with call bell/phone within reach;with family/visitor present;with bed alarm set Nurse Communication: Mobility status;Need for lift equipment PT Visit Diagnosis: Difficulty in walking, not elsewhere classified (R26.2)     Time: 8952-8888 PT Time Calculation (min) (ACUTE ONLY): 24 min  Charges:    $Therapeutic Activity: 8-22 mins                       Darice Potters PT Acute Rehabilitation Services Office [phone] Weekend [phone]    Potters Darice Norris 10/04/2023, 1:54 PM

## 2023-10-05 ENCOUNTER — Inpatient Hospital Stay (HOSPITAL_COMMUNITY): Payer: Medicare HMO

## 2023-10-05 DIAGNOSIS — K922 Gastrointestinal hemorrhage, unspecified: Secondary | ICD-10-CM | POA: Diagnosis not present

## 2023-10-05 DIAGNOSIS — D62 Acute posthemorrhagic anemia: Secondary | ICD-10-CM | POA: Diagnosis not present

## 2023-10-05 DIAGNOSIS — N179 Acute kidney failure, unspecified: Secondary | ICD-10-CM | POA: Diagnosis not present

## 2023-10-05 DIAGNOSIS — K921 Melena: Secondary | ICD-10-CM | POA: Diagnosis not present

## 2023-10-05 DIAGNOSIS — J9601 Acute respiratory failure with hypoxia: Secondary | ICD-10-CM | POA: Diagnosis not present

## 2023-10-05 DIAGNOSIS — J942 Hemothorax: Secondary | ICD-10-CM | POA: Diagnosis not present

## 2023-10-05 LAB — BASIC METABOLIC PANEL
Anion gap: 8 (ref 5–15)
BUN: 32 mg/dL — ABNORMAL HIGH (ref 8–23)
CO2: 24 mmol/L (ref 22–32)
Calcium: 8 mg/dL — ABNORMAL LOW (ref 8.9–10.3)
Chloride: 108 mmol/L (ref 98–111)
Creatinine, Ser: 1.27 mg/dL — ABNORMAL HIGH (ref 0.61–1.24)
GFR, Estimated: 59 mL/min — ABNORMAL LOW (ref >60)
Glucose, Bld: 86 mg/dL (ref 70–99)
Potassium: 3.6 mmol/L (ref 3.5–5.1)
Sodium: 140 mmol/L (ref 135–145)

## 2023-10-05 LAB — CBC
HCT: 25.3 % — ABNORMAL LOW (ref 39.0–52.0)
Hemoglobin: 7.6 g/dL — ABNORMAL LOW (ref 13.0–17.0)
MCH: 30.2 pg (ref 26.0–34.0)
MCHC: 30 g/dL (ref 30.0–36.0)
MCV: 100.4 fL — ABNORMAL HIGH (ref 80.0–100.0)
Platelets: 152 10*3/uL (ref 150–400)
RBC: 2.52 MIL/uL — ABNORMAL LOW (ref 4.22–5.81)
RDW: 15.4 % (ref 11.5–15.5)
WBC: 4.5 10*3/uL (ref 4.0–10.5)
nRBC: 0 % (ref 0.0–0.2)

## 2023-10-05 MED ORDER — PIPERACILLIN-TAZOBACTAM 3.375 G IVPB 30 MIN
3.3750 g | Freq: Once | INTRAVENOUS | Status: AC
  Administered 2023-10-05: 3.375 g via INTRAVENOUS
  Filled 2023-10-05: qty 50

## 2023-10-05 MED ORDER — POTASSIUM CITRATE ER 10 MEQ (1080 MG) PO TBCR
20.0000 meq | EXTENDED_RELEASE_TABLET | Freq: Once | ORAL | Status: AC
  Administered 2023-10-05: 20 meq via ORAL
  Filled 2023-10-05: qty 2

## 2023-10-05 MED ORDER — SODIUM CHLORIDE 3 % IN NEBU
4.0000 mL | INHALATION_SOLUTION | Freq: Two times a day (BID) | RESPIRATORY_TRACT | Status: AC
  Administered 2023-10-06 – 2023-10-07 (×3): 4 mL via RESPIRATORY_TRACT
  Filled 2023-10-05 (×6): qty 4

## 2023-10-05 MED ORDER — FUROSEMIDE 10 MG/ML IJ SOLN
40.0000 mg | Freq: Once | INTRAMUSCULAR | Status: AC
  Administered 2023-10-05: 40 mg via INTRAVENOUS
  Filled 2023-10-05: qty 4

## 2023-10-05 MED ORDER — PIPERACILLIN-TAZOBACTAM 3.375 G IVPB
3.3750 g | Freq: Three times a day (TID) | INTRAVENOUS | Status: DC
  Administered 2023-10-05 – 2023-10-06 (×4): 3.375 g via INTRAVENOUS
  Filled 2023-10-05 (×3): qty 50

## 2023-10-05 MED ORDER — SODIUM CHLORIDE 0.9% FLUSH
10.0000 mL | Freq: Three times a day (TID) | INTRAVENOUS | Status: DC
  Administered 2023-10-05 – 2023-10-06 (×4): 10 mL via INTRAPLEURAL

## 2023-10-05 NOTE — Progress Notes (Signed)
 [facility]                                                                              Soren Lazarz, is a 73 y.o. male, DOB - 02-17-1951, FMW:[id number] Admit date - 09/24/2023    Outpatient Primary MD for the patient is Yolande Toribio MATSU, MD  LOS - 11  days  Chief Complaint  Patient presents with   Weakness   Dizziness   Shortness of Breath       Brief summary   Patient is a 73 year old M with PMH of PAF/flutter on Pradaxa , CAD s/p stents in 2009, DM-2, CKD-3A, OSA not on CPAP, HTN, HLD and osteoarthritis presenting with progressive generalized weakness, dizziness, dyspnea, melena and edema for weeks, and admitted with symptomatic anemia in the setting of GI bleed, AKI with uremia and hyponatremia.   In ED, vitals stable. Na 128. Cr 4.45 (1.4 on 8/13).  BUN 126.  Bicarb 15.  AG 12.  Hgb 7.0.  Platelets 148.  UA with microscopic hematuria.  One units of blood ordered.  Started on IV Protonix .  GI consulted.   EGD delayed due to elevated INR. IV vitamin K  and Praxibind on 12/28 and 12/30.  Also received FFP on 12/29.  Received 2 units of PRBC so far. EGD on 1/1 with 2 gastric AVMs, 1 with intermittent oozing and treated with APC.  GI recommended p.o. Protonix  40 mg twice daily for 4 weeks followed by 40 mg daily indefinitely.    Patient had an episode of increased oxygen requirement in the evening of 12/30.  Chest x-ray showed loculated right moderate pleural effusion.  PCCM consulted.  Thoracentesis yielded 150 cc dark bloody appearing fluid.  Follow-up CT chest with large right loculated pleural effusion concerning for clotted hemothorax.  PCCM placed large bore chest tube on 01/01.  TTE without significant finding. AKI improving with IV fluid.  Renal US  without acute finding.     Assessment & Plan    Principal Problem: Symptomatic ABLA due to upper GIB, right hemothorax: -Patient was on Pradaxa  for A-fib/flutter. Hgb 7.0 (was 13.1 on 6/20).  Had melena for  over 2 weeks.  Was symptomatic.  S/p IV vitamin K  and Praxibind on 12/28 and 12/30.  Also received FFP on 12/29.  Received 2 units of PRBC. -  EGD on 1/1 with 2 gastric AVMs, 1 with intermittent oozing and treated with APC.  GI recommended p.o. Protonix  40 mg twice daily for 4 weeks followed by 40 mg daily indefinitely.  -Continue to hold Pradaxa . -Patient follows cardiology, Dr. Waddell, outpatient, will need recommendations regarding anticoagulation once right-sided hemothorax is resolved.   Right-sided hemothorax, loculated pleural effusion: - Likely due to Pradaxa , INR supratherapeutic 9.5 on 12/27, reversed with FFP, Praxbind  --S/p thoracocentesis with removal of 150 cc dark bloody culture-negative exudative and neutrophilic fluid. -s/p right chest tube on 1/1.   -CT chest 1/4 showed a complex right-sided pleural gas and fluid collection could represent hemopneumothorax or empyema, extensive past several atelectasis in the right lung. -CT-guided chest tube placement by IR on 1/6, coughing purulent sputum -Started on IV Zosyn    Acute  respiratory failure with hypoxia:  - Due to OSA, atelectasis, hemothorax and nasal blockage from dry blood -O2 sats 97 to 98% on 2 L O2   Acute delirium, sundowning -On 1/5, needed wrist restraints and mittens due to interference with medical interventions.   -Still confused, with pill-rolling type movements new per family, possibly due to delirium -Sinemet  was started on 1/5, discontinued -Continue Seroquel      AKI on CKD-3A/uremia:  - Cr 1.4 in 04/2023.  ATN from anemia, on Benicar  at home.  Denied NSAID use.  UA with hematuria. -   Renal US  without significant finding. -Creatinine improving     Diabetes mellitus type 2, NIDDM with CKD-3A:  -A1c 5.5%.  CBG within normal range.       Essential hypertension:  - 2D echo 12/28 showed EF of 55 to 60%, no regional WMA  -Continue Coreg , Imdur   History of CAD s/p stent in 2009:  -Resumed home  Coreg  at reduced dose.  Resume Imdur . -Losartan  on hold -CT chest reviewed, patient has a known history of CAD status post PCI in 2009, currently has no anginal symptoms, continue Coreg , Imdur .    Paroxysmal atrial fibrillation -  Per chart review, patient was switched from warfarin to Pradaxa  in 2011.   -Continue Coreg  -Continue to hold Pradaxa .   -Once cleared from GI standpoint and chest tube removed, will need cardiology recommendations regarding anticoagulation.  Patient follows Dr. Waddell.    Non-anion gap metabolic acidosis: - Resolved.   Hyponatremia:  - Resolved.   Epistaxis: Chronic issue likely from Pradaxa . - Resolved.  -S/p  Praxibind on 12/28 and vitamin K  on 12/28 and 12/30, and FFP on 12/29 -H&H stable   Gross hematuria: Likely due to coagulopathy.  Resolving. -Dr Kathrin discussed with urology, Dr. Watt on 12/30, was recommended outpatient follow-up.    Thrombocytopenia:  -Resolved.   Physical deconditioning -PT/OT-recommended CIR   Hyperlipidemia -Continue rosuvastatin     Elevated INR: 9.5 on 12/28>> 1.5 -S/p Praxbind , vitamin K  and FFP.   Morbid obesity:  Estimated body mass index is 36.91 kg/m as calculated from the following:   Height as of this encounter: 5' 8 (1.727 m).   Weight as of this encounter: 110.1 kg.  Code Status: Full code DVT Prophylaxis:  SCDs Start: 09/24/23 1503   Level of Care: Level of care: Stepdown Family Communication: Updated patient's wife wife and daughter at the bedside Disposition Plan:      Remains inpatient appropriate:      Procedures:  2D echo 12/31-right thoracocentesis with removal of 150 cc dark bloody appearing fluid 1/1-right chest tube by pulmonology 1/2-EGD with 2 gastric AVMs, 1 of which oozing and treated with APC's 1/6: CT-guided right percutaneous total drainage catheter by IR  Consultants:   PCCM GI  Antimicrobials:   Anti-infectives (From admission, onward)    Start     Dose/Rate Route  Frequency Ordered Stop   10/05/23 1800  piperacillin -tazobactam (ZOSYN ) IVPB 3.375 g        3.375 g 12.5 mL/hr over 240 Minutes Intravenous Every 8 hours 10/05/23 1028     10/05/23 1115  piperacillin -tazobactam (ZOSYN ) IVPB 3.375 g        3.375 g 100 mL/hr over 30 Minutes Intravenous  Once 10/05/23 1028 10/05/23 1256          Medications  carvedilol   3.125 mg Oral BID WC   Chlorhexidine  Gluconate Cloth  6 each Topical Daily   cyanocobalamin   500 mcg Oral Daily  isosorbide  mononitrate  60 mg Oral Daily   pantoprazole  (PROTONIX ) IV  40 mg Intravenous Q12H   QUEtiapine   25 mg Oral QHS   sodium chloride  flush  10 mL Intravenous Q12H   sodium chloride  flush  40 mL Intrapleural Q8H   sodium chloride  HYPERTONIC  4 mL Nebulization BID   tamsulosin   0.4 mg Oral Daily      Subjective:   Keyshun Elpers was seen and examined today morning.  Still confused, daughter and wife at the bedside.  Oriented to self and person but not to time.  Productive phlegm.  No nausea vomiting, chest pain.  Objective:   Vitals:   10/05/23 0900 10/05/23 1000 10/05/23 1100 10/05/23 1200  BP:      Pulse: 63 (!) 118 87 80  Resp: (!) 23 (!) 23 (!) 24 (!) 25  Temp:      TempSrc:      SpO2: 98% 97% 97% 96%  Weight:      Height:        Intake/Output Summary (Last 24 hours) at 10/05/2023 1318 Last data filed at 10/05/2023 1106 Gross per 24 hour  Intake 1110 ml  Output 1380 ml  Net -270 ml     Wt Readings from Last 3 Encounters:  10/05/23 110.1 kg  04/27/23 115.2 kg  03/18/23 119.9 kg   Physical Exam General: Alert and oriented x self and person, confused Cardiovascular: S1 S2 clear, RRR.  Respiratory: Rhonchi on the right, chest tube+ Gastrointestinal: Soft, nontender, nondistended, NBS Ext: 1-2+ edema Neuro: responds appropriately to questions, moving all 4 extremities, frequent hand/finger movements Psych: flat affect   Data Reviewed:  I have personally reviewed following labs     CBC Lab Results  Component Value Date   WBC 4.5 10/05/2023   RBC 2.52 (L) 10/05/2023   HGB 7.6 (L) 10/05/2023   HCT 25.3 (L) 10/05/2023   MCV 100.4 (H) 10/05/2023   MCH 30.2 10/05/2023   PLT 152 10/05/2023   MCHC 30.0 10/05/2023   RDW 15.4 10/05/2023   LYMPHSABS 0.8 03/18/2023   MONOABS 0.4 03/18/2023   EOSABS 0.3 03/18/2023   BASOSABS 0.0 03/18/2023     Last metabolic panel Lab Results  Component Value Date   NA 140 10/05/2023   K 3.6 10/05/2023   CL 108 10/05/2023   CO2 24 10/05/2023   BUN 32 (H) 10/05/2023   CREATININE 1.27 (H) 10/05/2023   GLUCOSE 86 10/05/2023   GFRNONAA 59 (L) 10/05/2023   GFRAA >60 04/26/2018   CALCIUM  8.0 (L) 10/05/2023   PHOS 2.1 (L) 09/30/2023   PROT 5.7 (L) 10/01/2023   ALBUMIN  2.1 (L) 10/01/2023   LABGLOB 2.3 06/15/2022   BILITOT 0.8 10/01/2023   ALKPHOS 32 (L) 10/01/2023   AST 22 10/01/2023   ALT 11 10/01/2023   ANIONGAP 8 10/05/2023    CBG (last 3)  No results for input(s): GLUCAP in the last 72 hours.     Coagulation Profile: Recent Labs  Lab 09/29/23 0353 09/30/23 0316 10/04/23 1338  INR 1.7* 1.5* 1.2     Radiology Studies: I have personally reviewed the imaging studies  DG Chest Port 1 View Result Date: 10/05/2023 CLINICAL DATA:  [id number] Chest tube in place [id number] EXAM: PORTABLE CHEST 1 VIEW COMPARISON:  Chest XR and CT chest, 10/02/2023.  IR CT, 10/04/2023. FINDINGS: Support lines: Interval removal of prior large bore tube and insertion of a basilar pigtail thoracostomy tube within the RIGHT chest. Cardiomegaly. Hypoinflation. The LEFT lung is  relatively clear. Similar degree of inflation of the RIGHT lung with unchanged small volume RIGHT apical pneumothorax. RIGHT perihilar opacities and basilar consolidation with small volume pleural effusion. No interval osseous abnormality. IMPRESSION: 1. Interval removal of large bore tube and insertion of a basilar pigtail thoracostomy tube within the RIGHT chest. 2.  Similar, small volume RIGHT apical pneumothorax. 3. RIGHT basilar consolidation and small volume pleural effusion Electronically Signed   By: Thom Hall M.D.   On: 10/05/2023 08:59   CT [facility] PLEURAL DRAIN W/INDWELL CATH W/IMG GUIDE Result Date: 10/04/2023 INDICATION: Prior mal-positioned chest tube tube. Request percutaneous thoracostomy [id number] [place] (HCC) [id number] EXAM: CT-GUIDED RIGHT PIGTAIL THORACOSTOMY TUBE PLACEMENT COMPARISON:  CT chest, 10/02/2023.  Chest XR, 10/02/2023. MEDICATIONS: The patient is currently admitted to the hospital and receiving intravenous antibiotics. The antibiotics were administered within an appropriate time frame prior to the initiation of the procedure. ANESTHESIA/SEDATION: Moderate (conscious) sedation was employed during this procedure. A total of Versed  2 mg and Fentanyl  100 mcg was administered intravenously. Moderate Sedation Time: 20 minutes. The patient's level of consciousness and vital signs were monitored continuously by radiology nursing throughout the procedure under my direct supervision. CONTRAST:  None COMPLICATIONS: None immediate. FLUOROSCOPY TIME:  CT dose; 954 mGycm PROCEDURE: RADIATION DOSE REDUCTION: This exam was performed according to the departmental dose-optimization program which includes automated exposure control, adjustment of the mA and/or kV according to patient size and/or use of iterative reconstruction technique. Informed written consent was obtained from the patient and/or patient's representative after a discussion of the risks, benefits and alternatives to treatment. The patient was placed supine on the CT gantry and a pre procedural CT was performed re-demonstrating the known abscess/fluid collection within the RIGHT chest. The procedure was planned. A timeout was performed prior to the initiation of the procedure. The RIGHT chest was prepped and draped in the usual sterile fashion. The overlying soft tissues were anesthetized with 1%  lidocaine  with epinephrine. Appropriate trajectory was planned with the use of a 22 gauge spinal needle. An 18 gauge trocar needle was advanced into the abscess/fluid collection and a short Amplatz super stiff wire was coiled within the collection. Appropriate positioning was confirmed with a limited CT scan. The tract was serially dilated allowing placement of a 14 Fr drainage catheter. Appropriate positioning was confirmed with a limited postprocedural CT scan. 15 ml of thick serosanguineous fluid was aspirated. The tube was connected to a pleura vac and sutured in place. A dressing was placed. The patient tolerated the procedure well without immediate post procedural complication. IMPRESSION: Successful CT guided placement of a 14 Fr pigtail thoracostomy drainage catheter into the LEFT chest with aspiration of 15 mL of thick serosanguineous fluid. Samples were sent to the laboratory as requested by the ordering clinical team. PLAN: Additional care including fibrinolysis per Pulmonology team. Thom Hall, MD [facility] Electronically Signed   By: Thom Hall M.D.   On: 10/04/2023 17:40        Julie-Anne Torain M.D. [facility] 10/05/2023, 1:18 PM  Available via Epic secure chat 7am-7pm After 7 pm, please refer to night coverage provider listed on amion.

## 2023-10-05 NOTE — Progress Notes (Signed)
 NAME:  Joshua [name], MRN:  [id number], DOB:  September 04, 1951, LOS: 11 ADMISSION DATE:  09/24/2023, CONSULTATION DATE:  09/28/23 REFERRING MD:  Mignon Bump, MD, CHIEF COMPLAINT:  Pleural effusion   History of Present Illness:  73 year old male with AF/flutter on Pradaxa , CAD s/p stents in 2009, DM2, CKD IIIA, HTN, HLD, OA OSA not on CPAP with presented with weakness, dizziness, SOB and melena admitted to [facility] for symptomatic anemia secondary to GI bleed and AKI. Since hospitalization he has received IVF, PRBC x 2U, PPI, vitamin K , praxibind and FFP. Planned for EGD today or tomorrow.  Yesterday had increased O2 requirement to 5L associated with epistaxis. Seen by [facility] and improved to 2L via [place] after blowing out nasal blood clots. CXR demonstrated loculated right pleural effusion. PCCM consulted for evaluation for thoracentesis.  Has shortness of breath with movement. Improved with oxygen. Denies past history of pleural effusion or need for thoracentesis  Pertinent  Medical History  AF/flutter on Pradaxa , CAD s/p stents in 2009, DM2, CKD IIIA, HTN, HLD, OA OSA not on CPAP  Significant Hospital Events: Including procedures, antibiotic start and stop dates in addition to other pertinent events   S/p thoracentesis 09/28/23 with 150 cc blood fluid removed 1/1 large bore  Chest CT with complex right pleura gas and fluid collection and atelectasis of right lung. Right chest tube extending into right major fissues1/4 Chest tube  1/4 CT pulled back. Still not much output 1/6 CT removed. Spoke w/ Thoracic surg. Rec'd CT guided CT which was placed by IR. Cultures repeated  1/7 added zosyn  for right sided infiltrate and purulent sputum. CT put out total of 475 ml since placed 1/6. Still exhibiting delirium. Stopping the sinemet . Getting lasix . Starting IS/Flutter and HT NaCl nebs   Interim History / Subjective:  Feels and looks about the same  Objective   Blood pressure (!) 143/81, pulse (!) 53,  temperature 97.7 F (36.5 C), temperature source Axillary, resp. rate (!) 23, height 5' 8 (1.727 m), weight 110.1 kg, SpO2 97%.        Intake/Output Summary (Last 24 hours) at 10/05/2023 0758 Last data filed at 10/05/2023 9351 Gross per 24 hour  Intake 870 ml  Output 1480 ml  Net -610 ml   Filed Weights   10/03/23 0442 10/04/23 0500 10/05/23 0500  Weight: 112.8 kg 113.4 kg 110.1 kg   Physical Exam: General this is 73 year old male who is laying in bed. No distress HENT NCAT no JVD MMM Pulm rhonchi in right. No accessory use. Remains on 2 lpm coughing up thick tan blood tinged mucous CT has put out 475 ml since change to IR guided. PCXR w/ worsening RML airspace disease as well as dec'd aeration at base  Card RRR Abd soft large Ext diffuse anasarca. Brisk CR. + total body overload  Neuro awake. Follows commands. Affect still flat and does not really care to interact. Moves all ext when asked. Oriented x 2/ still has freq hand movements that escalate the more he is interacted with   Resolved Hospital Problem list   N/A  Assessment & Plan:  Acute hypoxic resp failure  epistaxis (resolved) Aspiration PNA w/ R basilar ASD  hemothorax (spont 2/2 coagulopathy) Small R apical ptx  Acute on chronic renal failure CKD stage IIIB H/o CAD HTN DM type II Acute delirium    Pulm prob list  Acute hypoxic resp failure 2/2 epistaxis and resultant aspiration, spont hemothorax (INR was 9.5) still  w/ complicated right pleural space w/ on-going posterior fluid collection  -recent elevated INR. S/p thora 12/31 and large bore chest tube 1/1  -no malignant cells on cyto, removed 1/6, CT guided tube placed 1/6. Tmax 98.5, wbc ct not up but now w/ infiltrate and purulent sputum on 1/7  Plan Cont small bore CT to sxn  Procal re-assuring, wbc ok but now coughing up purulent sputum w/ Right sided lung infiltrate so starting zosyn .  F/u pleural cultures Add HT neb, flutter and IS Needs to get  OOB Cont to hold Pradaxa  given bloody CT output Am cxr Cont to watch hgb. May end up needing another unit of blood before dc   Acute delirium  -confused. Has pill rolling type movements (acutely) started on sinemet  1/5  Qtc 426 Plan Cont seroquel  at current. Will see how he does over the day. May need to increase  Cont supportive care Will stop sinemet . Prob best to avoid polypharm    Best Practice (right click and Reselect all SmartList Selections daily)   Diet/type: Regular consistency (see orders) DVT prophylaxis not indicated in setting of bleed Pressure ulcer(s): pressure ulcer assessment deferred  GI prophylaxis: PPI Lines: N/A Foley:  N/A Code Status:  full code Last date of multidisciplinary goals of care discussion [ per primary]   Critical care time: N/A My time 33 min

## 2023-10-05 NOTE — Progress Notes (Signed)
 Pharmacy Antibiotic Note  Joshua [name] is a 73 y.o. male admitted on 09/24/2023 with GI hemorrhage with melena.  Pharmacy has been consulted for zosyn  dosing for HCAP.  Plan: Zosyn  3.375g IV q8h (4 hour infusion).  Height: 5' 8 (172.7 cm) Weight: 110.1 kg (242 lb 11.6 oz) IBW/kg (Calculated) : 68.4  Temp (24hrs), Avg:98 F (36.7 C), Min:97.7 F (36.5 C), Max:98.5 F (36.9 C)  Recent Labs  Lab 09/30/23 0316 10/01/23 0308 10/02/23 0329 10/04/23 0333 10/05/23 0302  WBC 5.1 5.7 6.9 5.7 4.5  CREATININE 2.03* 1.75* 1.69* 1.45* 1.27*    Estimated Creatinine Clearance: 61.4 mL/min (A) (by C-G formula based on SCr of 1.27 mg/dL (H)).    Allergies  Allergen Reactions   Desipramine  Hives and Itching   Niacin Itching    Antimicrobials this admission: 1/7 Zozyn>> Dose adjustments this admission:  Microbiology results: 1/6 chest tube fluid: 12/31 pleural fluid: ngF 12/27 MRSA neg  Thank you for allowing pharmacy to be a part of this patient's care.  Rosaline IVAR Edison, Pharm.D Use secure chat for questions 10/05/2023 10:33 AM

## 2023-10-05 NOTE — Plan of Care (Signed)
  Problem: Activity: Goal: Risk for activity intolerance will decrease Outcome: Progressing   Problem: Pain Management: Goal: General experience of comfort will improve Outcome: Progressing

## 2023-10-05 NOTE — Plan of Care (Signed)
   Problem: Education: Goal: Knowledge of General Education information will improve Description: Including pain rating scale, medication(s)/side effects and non-pharmacologic comfort measures Outcome: Progressing   Problem: Clinical Measurements: Goal: Respiratory complications will improve Outcome: Progressing   Problem: Coping: Goal: Level of anxiety will decrease Outcome: Progressing

## 2023-10-05 NOTE — Progress Notes (Signed)
 Referring Physician(s): Kassie PARAS  Supervising Physician: Hughes Simmonds  Patient Status:  [facility] - In-pt  Chief Complaint:  Weakness, dizziness, dyspnea  Subjective: Pt resting quietly in bed, eyes closed;no family in room, afebrile, no acute distress   Allergies: Desipramine  and Niacin  Medications: Prior to Admission medications   Medication Sig Start Date End Date Taking? Authorizing Provider  carvedilol  (COREG ) 12.5 MG tablet TAKE 1 TABLET BY MOUTH TWICE A DAY 06/14/23  Yes Waddell Danelle ORN, MD  fenofibrate 160 MG tablet Take 160 mg by mouth daily. 06/16/20  Yes [provider]  ipratropium (ATROVENT) 0.06 % nasal spray Place 1 spray into both nostrils 3 (three) times daily as needed for rhinitis. 05/29/19  Yes [provider]  isosorbide  mononitrate (IMDUR ) 30 MG 24 hr tablet Take 30 mg by mouth daily. 03/11/23  Yes [provider]  nitroGLYCERIN  (NITROSTAT ) 0.4 MG SL tablet Place 0.4 mg under the tongue every 5 (five) minutes x 3 doses as needed for chest pain.   Yes [provider]  olmesartan  (BENICAR ) 40 MG tablet Take 1 tablet (40 mg total) by mouth daily. 04/27/23  Yes Waddell Danelle ORN, MD  pioglitazone  (ACTOS ) 15 MG tablet Take 15 mg by mouth daily.   Yes [provider]  rosuvastatin  (CRESTOR ) 20 MG tablet Take 20 mg by mouth daily. 03/30/20  Yes [provider]  TYLENOL  8 HOUR ARTHRITIS PAIN 650 MG CR tablet Take 650-1,300 mg by mouth every 8 (eight) hours as needed for pain.   Yes [provider]  vitamin B-12 (CYANOCOBALAMIN ) 500 MCG tablet Take 500 mcg by mouth daily.   Yes [provider]  PRADAXA  150 MG CAPS capsule TAKE 1 CAPSULE BY MOUTH TWICE A DAY 10/04/23   Waddell Danelle ORN, MD     Vital Signs: BP (!) 161/50   Pulse 80   Temp 98.2 F (36.8 C) (Oral)   Resp (!) 25   Ht 5' 8 (1.727 m)   Wt 242 lb 11.6 oz (110.1 kg)   SpO2 96%   BMI 36.91 kg/m   Physical Exam eyes closed, no distress; rt  chest drain intact,  550 cc blood-tinged fluid in pleuravac, no obvious air leak; to wall suction  Imaging: DG Chest Port 1 View Result Date: 10/05/2023 CLINICAL DATA:  [id number] Chest tube in place [id number] EXAM: PORTABLE CHEST 1 VIEW COMPARISON:  Chest XR and CT chest, 10/02/2023.  IR CT, 10/04/2023. FINDINGS: Support lines: Interval removal of prior large bore tube and insertion of a basilar pigtail thoracostomy tube within the RIGHT chest. Cardiomegaly. Hypoinflation. The LEFT lung is relatively clear. Similar degree of inflation of the RIGHT lung with unchanged small volume RIGHT apical pneumothorax. RIGHT perihilar opacities and basilar consolidation with small volume pleural effusion. No interval osseous abnormality. IMPRESSION: 1. Interval removal of large bore tube and insertion of a basilar pigtail thoracostomy tube within the RIGHT chest. 2. Similar, small volume RIGHT apical pneumothorax. 3. RIGHT basilar consolidation and small volume pleural effusion Electronically Signed   By: Simmonds Hughes M.D.   On: 10/05/2023 08:59   CT [facility] PLEURAL DRAIN W/INDWELL CATH W/IMG GUIDE Result Date: 10/04/2023 INDICATION: Prior mal-positioned chest tube tube. Request percutaneous thoracostomy [id number] Empyema (HCC) [id number] EXAM: CT-GUIDED RIGHT PIGTAIL THORACOSTOMY TUBE PLACEMENT COMPARISON:  CT chest, 10/02/2023.  Chest XR, 10/02/2023. MEDICATIONS: The patient is currently admitted to the hospital and receiving intravenous antibiotics. The antibiotics were administered within an appropriate time frame prior to the initiation of  the procedure. ANESTHESIA/SEDATION: Moderate (conscious) sedation was employed during this procedure. A total of Versed  2 mg and Fentanyl  100 mcg was administered intravenously. Moderate Sedation Time: 20 minutes. The patient's level of consciousness and vital signs were monitored continuously by radiology nursing throughout the procedure under my direct supervision. CONTRAST:  None COMPLICATIONS:  None immediate. FLUOROSCOPY TIME:  CT dose; 954 mGycm PROCEDURE: RADIATION DOSE REDUCTION: This exam was performed according to the departmental dose-optimization program which includes automated exposure control, adjustment of the mA and/or kV according to patient size and/or use of iterative reconstruction technique. Informed written consent was obtained from the patient and/or patient's representative after a discussion of the risks, benefits and alternatives to treatment. The patient was placed supine on the CT gantry and a pre procedural CT was performed re-demonstrating the known abscess/fluid collection within the RIGHT chest. The procedure was planned. A timeout was performed prior to the initiation of the procedure. The RIGHT chest was prepped and draped in the usual sterile fashion. The overlying soft tissues were anesthetized with 1% lidocaine  with epinephrine. Appropriate trajectory was planned with the use of a 22 gauge spinal needle. An 18 gauge trocar needle was advanced into the abscess/fluid collection and a short Amplatz super stiff wire was coiled within the collection. Appropriate positioning was confirmed with a limited CT scan. The tract was serially dilated allowing placement of a 14 Fr drainage catheter. Appropriate positioning was confirmed with a limited postprocedural CT scan. 15 ml of thick serosanguineous fluid was aspirated. The tube was connected to a pleura vac and sutured in place. A dressing was placed. The patient tolerated the procedure well without immediate post procedural complication. IMPRESSION: Successful CT guided placement of a 14 Fr pigtail thoracostomy drainage catheter into the LEFT chest with aspiration of 15 mL of thick serosanguineous fluid. Samples were sent to the laboratory as requested by the ordering clinical team. PLAN: Additional care including fibrinolysis per Pulmonology team. Thom Hall, MD [facility]  Radiology Electronically Signed   By: Thom Hall M.D.   On: 10/04/2023 17:40   DG CHEST PORT 1 VIEW Result Date: 10/02/2023 CLINICAL DATA:  Chest tube in place.  Follow-up pneumothorax. EXAM: PORTABLE CHEST 1 VIEW COMPARISON:  10/01/2023 FINDINGS: Right lower chest tube is stable in position from previous exam. Right apical pneumothorax measures 1.6 cm over the apex. This is unchanged from previous exam. Asymmetric opacification within the right mid and lower lung is unchanged in the interval. Left lung is clear. IMPRESSION: 1. Stable right apical pneumothorax. 2. Stable right chest tube. 3. No change in aeration to the right mid and lower lung. Electronically Signed   By: Waddell Calk M.D.   On: 10/02/2023 10:46   CT CHEST WO CONTRAST Result Date: 10/02/2023 CLINICAL DATA:  73 year old male with suspected hemopneumothorax. EXAM: CT CHEST WITHOUT CONTRAST TECHNIQUE: Multidetector CT imaging of the chest was performed following the standard protocol without IV contrast. RADIATION DOSE REDUCTION: This exam was performed according to the departmental dose-optimization program which includes automated exposure control, adjustment of the mA and/or kV according to patient size and/or use of iterative reconstruction technique. COMPARISON:  Chest CT 09/29/2023. FINDINGS: Cardiovascular: Heart size is mildly enlarged. There is no significant pericardial fluid, thickening or pericardial calcification. There is aortic atherosclerosis, as well as atherosclerosis of the great vessels of the mediastinum and the coronary arteries, including calcified atherosclerotic plaque in the left main, left anterior descending, left circumflex and right coronary arteries. Calcifications  of the mitral annulus. Dilatation of the pulmonic trunk (4.0 cm in diameter). Mediastinum/Nodes: No pathologically enlarged mediastinal or hilar lymph nodes. Please note that accurate exclusion of hilar adenopathy is limited on noncontrast CT scans.  Esophagus is unremarkable in appearance. No axillary lymphadenopathy. Lungs/Pleura: Right-sided chest tube noted extending into the right major fissure. Large volume of complex right-sided pleural fluid, with multiple internal locules of gas, many of which are not non dependent, suggesting loculated pleural effusion (potentially hemopneumothorax or empyema). Passive atelectasis in the right lung, most severe in the right lower lobe. No confluent consolidative airspace disease. No left pleural effusion. No definite suspicious appearing pulmonary nodule or mass identified. Upper Abdomen: Aortic atherosclerosis. Musculoskeletal: There are no aggressive appearing lytic or blastic lesions noted in the visualized portions of the skeleton. IMPRESSION: 1. Complex right-sided pleural gas and fluid collection which could represent a hemopneumothorax or empyema. Extensive passive atelectasis in the right lung. 2. Cardiomegaly. 3. Aortic atherosclerosis, in addition to left main and three-vessel coronary artery disease. Assessment for potential risk factor modification, dietary therapy or pharmacologic therapy may be warranted, if clinically indicated. 4. Dilatation of the pulmonic trunk (4.0 cm in diameter), concerning for pulmonary arterial hypertension. Aortic Atherosclerosis (ICD10-I70.0). Electronically Signed   By: Toribio Aye M.D.   On: 10/02/2023 08:57    Labs:  CBC: Recent Labs    10/01/23 0308 10/02/23 0329 10/04/23 0333 10/05/23 0302  WBC 5.7 6.9 5.7 4.5  HGB 9.5* 8.7* 8.2* 7.6*  HCT 30.9* 29.0* 27.2* 25.3*  PLT 151 201 153 152    COAGS: Recent Labs    09/26/23 1710 09/27/23 0735 09/27/23 1614 09/28/23 0604 09/28/23 1910 09/29/23 0353 09/30/23 0316 10/04/23 1338  INR  --    < > 2.5* 1.3*  --  1.7* 1.5* 1.2  APTT 89*  --  78* 36 48*  --   --   --    < > = values in this interval not displayed.    BMP: Recent Labs    10/01/23 0308 10/02/23 0329 10/04/23 0333 10/05/23 0302   NA 142 139 140 140  K 3.9 3.9 4.0 3.6  CL 108 107 109 108  CO2 25 24 22 24   GLUCOSE 98 112* 95 86  BUN 37* 41* 38* 32*  CALCIUM  8.3* 8.1* 8.2* 8.0*  CREATININE 1.75* 1.69* 1.45* 1.27*  GFRNONAA 40* 42* 51* 59*    LIVER FUNCTION TESTS: Recent Labs    03/18/23 1026 09/24/23 1227 09/25/23 0317 09/26/23 0242 09/28/23 0604 09/28/23 1112 09/29/23 0353 09/30/23 0316 10/01/23 0308  BILITOT 0.8 1.0 1.0  --   --   --   --   --  0.8  AST 29 23 20   --   --   --   --   --  22  ALT 12 12 11   --   --   --   --   --  11  ALKPHOS 40 30* 27*  --   --   --   --   --  32*  PROT 6.3* 5.7* 5.2*  --   --  <3.0*  --   --  5.7*  ALBUMIN  3.5 2.3* 2.1*   < > 2.4*  --  2.3* 2.3* 2.1*   < > = values in this interval not displayed.    Assessment and Plan: 73 year old male with past medical history significant for PAF/flutter on Pradaxa , coronary artery disease with prior MI/stenting in 2009, diabetes, chronic kidney disease, sleep apnea,  gout, hypertension, hyperlipidemia, osteoarthritis who was admitted to [facility] on 09/24/2023 with progressive weakness, dizziness, dyspnea, melena ,edema, anemia/uremia and hyponatremia. Also with loculated rt pleural effusion; s/p chest drain placement 1/6; afebrile, WBC nl, hgb 7.6(8.2), creat 1.27, pleural fl cx neg to date, cyt neg; latest CXR pend; cont current tx, closely monitor drain output; further plans as outlined by CCM   Electronically Signed: D. Franky Rakers, PA-C 10/05/2023, 2:12 PM   I spent a total of 15 minutes at the the patient's bedside AND on the patient's hospital floor or unit, greater than 50% of which was counseling/coordinating care for right chest drain    Patient ID: Joshua [name], male   DOB: 1950/12/30, 73 y.o.   MRN: [id number]

## 2023-10-05 NOTE — Progress Notes (Signed)
 Pt wanted blow nose, untied wrist restraints, applied ocean spray to nares, blow out big dried booger  bilateral nose, pt is following commands oriented. Leave restraints untied for now, wife is @ bedside. Notified to pt and wife, if getting restless and pulling lines, will reapply restraints and they agreed.

## 2023-10-06 ENCOUNTER — Inpatient Hospital Stay (HOSPITAL_COMMUNITY): Payer: Medicare HMO

## 2023-10-06 DIAGNOSIS — K921 Melena: Secondary | ICD-10-CM | POA: Diagnosis not present

## 2023-10-06 DIAGNOSIS — N179 Acute kidney failure, unspecified: Secondary | ICD-10-CM | POA: Diagnosis not present

## 2023-10-06 DIAGNOSIS — K922 Gastrointestinal hemorrhage, unspecified: Secondary | ICD-10-CM | POA: Diagnosis not present

## 2023-10-06 DIAGNOSIS — D62 Acute posthemorrhagic anemia: Secondary | ICD-10-CM | POA: Diagnosis not present

## 2023-10-06 DIAGNOSIS — J942 Hemothorax: Secondary | ICD-10-CM | POA: Diagnosis not present

## 2023-10-06 DIAGNOSIS — J9601 Acute respiratory failure with hypoxia: Secondary | ICD-10-CM | POA: Diagnosis not present

## 2023-10-06 LAB — CBC
HCT: 26.1 % — ABNORMAL LOW (ref 39.0–52.0)
Hemoglobin: 8.4 g/dL — ABNORMAL LOW (ref 13.0–17.0)
MCH: 31 pg (ref 26.0–34.0)
MCHC: 32.2 g/dL (ref 30.0–36.0)
MCV: 96.3 fL (ref 80.0–100.0)
Platelets: 162 10*3/uL (ref 150–400)
RBC: 2.71 MIL/uL — ABNORMAL LOW (ref 4.22–5.81)
RDW: 15.6 % — ABNORMAL HIGH (ref 11.5–15.5)
WBC: 5 10*3/uL (ref 4.0–10.5)
nRBC: 0 % (ref 0.0–0.2)

## 2023-10-06 LAB — HEPATIC FUNCTION PANEL
ALT: 10 U/L (ref 0–44)
AST: 25 U/L (ref 15–41)
Albumin: 1.7 g/dL — ABNORMAL LOW (ref 3.5–5.0)
Alkaline Phosphatase: 36 U/L — ABNORMAL LOW (ref 38–126)
Bilirubin, Direct: 0.2 mg/dL (ref 0.0–0.2)
Indirect Bilirubin: 0.5 mg/dL (ref 0.3–0.9)
Total Bilirubin: 0.7 mg/dL (ref 0.0–1.2)
Total Protein: 5 g/dL — ABNORMAL LOW (ref 6.5–8.1)

## 2023-10-06 LAB — BASIC METABOLIC PANEL
Anion gap: 8 (ref 5–15)
BUN: 32 mg/dL — ABNORMAL HIGH (ref 8–23)
CO2: 27 mmol/L (ref 22–32)
Calcium: 7.8 mg/dL — ABNORMAL LOW (ref 8.9–10.3)
Chloride: 101 mmol/L (ref 98–111)
Creatinine, Ser: 1.49 mg/dL — ABNORMAL HIGH (ref 0.61–1.24)
GFR, Estimated: 49 mL/min — ABNORMAL LOW (ref >60)
Glucose, Bld: 101 mg/dL — ABNORMAL HIGH (ref 70–99)
Potassium: 3.6 mmol/L (ref 3.5–5.1)
Sodium: 136 mmol/L (ref 135–145)

## 2023-10-06 LAB — C-REACTIVE PROTEIN: CRP: 4.3 mg/dL — ABNORMAL HIGH (ref <1.0)

## 2023-10-06 LAB — PROTIME-INR
INR: 1.1 (ref 0.8–1.2)
Prothrombin Time: 14.3 s (ref 11.4–15.2)

## 2023-10-06 LAB — AMMONIA: Ammonia: 44 umol/L — ABNORMAL HIGH (ref 9–35)

## 2023-10-06 LAB — SEDIMENTATION RATE: Sed Rate: 110 mm/h — ABNORMAL HIGH (ref 0–16)

## 2023-10-06 MED ORDER — PIPERACILLIN-TAZOBACTAM 3.375 G IVPB 30 MIN
3.3750 g | Freq: Four times a day (QID) | INTRAVENOUS | Status: DC
  Administered 2023-10-07 (×2): 3.375 g via INTRAVENOUS
  Filled 2023-10-06 (×2): qty 50

## 2023-10-06 MED ORDER — SODIUM CHLORIDE (PF) 0.9 % IJ SOLN
10.0000 mg | Freq: Once | INTRAMUSCULAR | Status: DC
  Filled 2023-10-06: qty 10

## 2023-10-06 MED ORDER — STERILE WATER FOR INJECTION IJ SOLN
5.0000 mg | Freq: Once | RESPIRATORY_TRACT | Status: DC
  Filled 2023-10-06: qty 5

## 2023-10-06 MED ORDER — MELATONIN 5 MG PO TABS
5.0000 mg | ORAL_TABLET | Freq: Every evening | ORAL | Status: AC | PRN
  Administered 2023-10-06 – 2023-10-16 (×4): 5 mg via ORAL
  Filled 2023-10-06 (×5): qty 1

## 2023-10-06 MED ORDER — SODIUM CHLORIDE 0.9% FLUSH
10.0000 mL | Freq: Three times a day (TID) | INTRAVENOUS | Status: DC
  Administered 2023-10-06 – 2023-10-15 (×23): 10 mL via INTRAPLEURAL

## 2023-10-06 MED ORDER — MIDODRINE HCL 5 MG PO TABS
10.0000 mg | ORAL_TABLET | Freq: Three times a day (TID) | ORAL | Status: DC
  Administered 2023-10-06 – 2023-10-07 (×3): 10 mg via ORAL
  Filled 2023-10-06 (×3): qty 2

## 2023-10-06 MED ORDER — QUETIAPINE 12.5 MG HALF TABLET: 12.5000 mg | ORAL_TABLET | Freq: Every day | ORAL | Status: DC

## 2023-10-06 MED ORDER — ALBUMIN HUMAN 25 % IV SOLN
25.0000 g | Freq: Four times a day (QID) | INTRAVENOUS | Status: AC
  Administered 2023-10-06 – 2023-10-07 (×4): 25 g via INTRAVENOUS
  Filled 2023-10-06 (×4): qty 100

## 2023-10-06 MED ORDER — LACTULOSE 10 GM/15ML PO SOLN
20.0000 g | Freq: Two times a day (BID) | ORAL | Status: DC
  Administered 2023-10-06 – 2023-10-18 (×8): 20 g via ORAL
  Filled 2023-10-06 (×13): qty 30

## 2023-10-06 NOTE — Plan of Care (Signed)

## 2023-10-06 NOTE — Progress Notes (Addendum)
 NAME:  ELKIN BELFIELD, MRN:  [id number], DOB:  1950-11-14, LOS: 12 ADMISSION DATE:  09/24/2023, CONSULTATION DATE:  09/28/23 REFERRING MD:  Mignon Bump, MD, CHIEF COMPLAINT:  Pleural effusion   History of Present Illness:  73 year old male with AF/flutter on Pradaxa , CAD s/p stents in 2009, DM2, CKD IIIA, HTN, HLD, OA OSA not on CPAP with presented with weakness, dizziness, SOB and melena admitted to [facility] for symptomatic anemia secondary to GI bleed and AKI. Since hospitalization he has received IVF, PRBC x 2U, PPI, vitamin K , praxibind and FFP. Planned for EGD today or tomorrow.  Yesterday had increased O2 requirement to 5L associated with epistaxis. Seen by [facility] and improved to 2L via [place] after blowing out nasal blood clots. CXR demonstrated loculated right pleural effusion. PCCM consulted for evaluation for thoracentesis.  Has shortness of breath with movement. Improved with oxygen. Denies past history of pleural effusion or need for thoracentesis  Pertinent  Medical History  AF/flutter on Pradaxa , CAD s/p stents in 2009, DM2, CKD IIIA, HTN, HLD, OA OSA not on CPAP  Significant Hospital Events: Including procedures, antibiotic start and stop dates in addition to other pertinent events   S/p thoracentesis 09/28/23 with 150 cc blood fluid removed 1/1 large bore  Chest CT with complex right pleura gas and fluid collection and atelectasis of right lung. Right chest tube extending into right major fissues1/4 Chest tube  1/4 CT pulled back. Still not much output 1/6 CT removed. Spoke w/ Thoracic surg. Rec'd CT guided CT which was placed by IR. Cultures repeated  1/7 added zosyn  for right sided infiltrate and purulent sputum. CT put out total of 475 ml since placed 1/6. Still exhibiting delirium. Stopping the sinemet . Getting lasix . Starting IS/Flutter and HT NaCl nebs   Interim History / Subjective:   Family at bedside, reports delirium is better. He is currently sleeping.  Chest tube put out  240 in past 24 hours.   Objective   Blood pressure (!) 110/52, pulse 81, temperature 98.1 F (36.7 C), temperature source Oral, resp. rate (!) 24, height 5' 8 (1.727 m), weight 110.1 kg, SpO2 100%.        Intake/Output Summary (Last 24 hours) at 10/06/2023 0752 Last data filed at 10/06/2023 0550 Gross per 24 hour  Intake 949.89 ml  Output 3590 ml  Net -2640.11 ml   Filed Weights   10/03/23 0442 10/04/23 0500 10/05/23 0500  Weight: 112.8 kg 113.4 kg 110.1 kg   Physical Exam: General: elderly male, no acute distress, sleeping HENT: [place]/AT, moist mucous membranes Lungs: diminished right base, clear on left, no wheezing. Chest tube in place. Cardiovascular: rrr, no murmurs Abdomen: soft, non-distended, BS+ Extremities: warm, +edema Neuro: sleeping GU: n/a  Resolved Hospital Problem list   N/A  Assessment & Plan:  Acute hypoxic resp failure  epistaxis (resolved) Aspiration PNA w/ R basilar ASD  hemothorax (spont 2/2 coagulopathy) Small R apical ptx  Acute on chronic renal failure CKD stage IIIB H/o CAD HTN DM type II Acute delirium    Pulm prob list  Acute hypoxic resp failure 2/2 epistaxis and resultant aspiration, spont hemothorax (INR was 9.5) still w/ complicated right pleural space w/ on-going posterior fluid collection  -recent elevated INR. S/p thora 12/31 and large bore chest tube 1/1  -no malignant cells on cyto, removed 1/6, CT guided tube placed 1/6. Tmax 98.5, wbc ct not up but now w/ infiltrate and purulent sputum on 1/7  Plan - Cont small bore  CT to sxn  - Continue zosyn  for pneumonia and complicated pleural space - no growth to date on pleural culture - discussed dose of pleural lytics with the family this morning - continue hypertonic saline nebs, flutter valve and incentive spirometry - Needs to get OOB - Cont to hold Pradaxa  given bloody CT output - Am cxr - Cont to watch hgb. May end up needing another unit of blood before dc   Acute delirium   -confused. Has pill rolling type movements (acutely) started on sinemet  1/5  Qtc 426 Plan Cont supportive care Sinemet  stopped 1/7  Stop seroquel  due to lethargy today   Best Practice (right click and Reselect all SmartList Selections daily)   Diet/type: Regular consistency (see orders) DVT prophylaxis not indicated in setting of bleed Pressure ulcer(s): pressure ulcer assessment deferred  GI prophylaxis: PPI Lines: N/A Foley:  N/A Code Status:  full code Last date of multidisciplinary goals of care discussion [ per primary]  Critical care time:    Dorn Chill, MD [facility] Office: [phone]   See Amion for personal pager PCCM on call pager [phone] until 7pm. Please call Elink 7p-7a. [phone]

## 2023-10-06 NOTE — Progress Notes (Addendum)
 [facility]                                                                              Joshua [name], is a 73 y.o. male, DOB - January 16, 1951, FMW:[id number] Admit date - 09/24/2023    Outpatient Primary MD for the patient is Yolande Toribio MATSU, MD  LOS - 12  days  Chief Complaint  Patient presents with   Weakness   Dizziness   Shortness of Breath       Brief summary   Patient is a 73 year old M with PMH of PAF/flutter on Pradaxa , CAD s/p stents in 2009, DM-2, CKD-3A, OSA not on CPAP, HTN, HLD and osteoarthritis presenting with progressive generalized weakness, dizziness, dyspnea, melena and edema for weeks, and admitted with symptomatic anemia in the setting of GI bleed, AKI with uremia and hyponatremia.   In ED, vitals stable. Na 128. Cr 4.45 (1.4 on 8/13).  BUN 126.  Bicarb 15.  AG 12.  Hgb 7.0.  Platelets 148.  UA with microscopic hematuria.  One units of blood ordered.  Started on IV Protonix .  GI consulted.   EGD delayed due to elevated INR. IV vitamin K  and Praxibind on 12/28 and 12/30.  Also received FFP on 12/29.  Received 2 units of PRBC so far. EGD on 1/1 with 2 gastric AVMs, 1 with intermittent oozing and treated with APC.  GI recommended p.o. Protonix  40 mg twice daily for 4 weeks followed by 40 mg daily indefinitely.    Patient had an episode of increased oxygen requirement in the evening of 12/30.  Chest x-ray showed loculated right moderate pleural effusion.  PCCM consulted.  Thoracentesis yielded 150 cc dark bloody appearing fluid.  Follow-up CT chest with large right loculated pleural effusion concerning for clotted hemothorax.  PCCM placed large bore chest tube on 01/01.  TTE without significant finding. AKI improving with IV fluid.  Renal US  without acute finding.     Assessment & Plan    Principal Problem: Symptomatic ABLA due to upper GIB, right hemothorax: -Patient was on Pradaxa  for A-fib/flutter. Hgb 7.0 (was 13.1 on 6/20).  Had melena for  over 2 weeks.  Was symptomatic.  S/p IV vitamin K  and Praxibind on 12/28 and 12/30.  Also received FFP on 12/29.  Received 2 units of PRBC. -  EGD on 1/1 with 2 gastric AVMs, 1 with intermittent oozing and treated with APC.  GI recommended p.o. Protonix  40 mg BID x 4 weeks followed by 40 mg daily indefinitely.  -Continue to hold Pradaxa . -Patient follows cardiology, Dr. Waddell, outpatient, will need recommendations regarding anticoagulation once right-sided hemothorax is resolved. -H&H improving, 8.4   Right-sided hemothorax, loculated pleural effusion: - Likely due to Pradaxa , INR supratherapeutic 9.5 on 12/27, reversed with FFP, Praxbind  - s/p thoracocentesis with removal of 150 cc dark bloody, culture-negative exudative and neutrophilic fluid. -s/p right chest tube on 1/1.   -CT chest 1/4 showed a complex right-sided pleural gas and fluid collection could represent hemopneumothorax or empyema, extensive past several atelectasis in the right lung. -CT-guided chest tube placement by IR on 1/6, coughing purulent sputum -Continue IV Zosyn   Acute respiratory failure with hypoxia:  - Due to OSA, atelectasis, hemothorax and nasal blockage from dry blood -O2 sat stable, 99 to 100% on 2 L   Acute delirium, sundowning, generalized debility -Still confused, very debilitated and somnolent -DC Seroquel  -Needs to increase mobility, out of bed, PT eval -Palliative consult for GOC     AKI on CKD-3A/uremia:  - Cr 1.4 in 04/2023.  ATN from anemia, on Benicar  at home.  Denied NSAID use.  UA with hematuria. -   Renal US  without significant finding. - Cr worsened today to 1.49,  -Follow renal function, poor p.o. intake, had received Lasix  40 mg IV x 1 on 1/7     Diabetes mellitus type 2, NIDDM with CKD-3A:  -A1c 5.5%.  CBG within normal range.       Essential hypertension:  - 2D echo 12/28 showed EF of 55 to 60%, no regional WMA  -Continue Coreg , Imdur   History of CAD s/p stent in 2009:   -Resumed home Coreg  at reduced dose.  Resume Imdur . -Losartan  on hold -CT chest reviewed, patient has a known history of CAD status post PCI in 2009, currently has no anginal symptoms, continue Coreg , Imdur .    Paroxysmal atrial fibrillation -  Per chart review, patient was switched from warfarin to Pradaxa  in 2011.   -Continue Coreg  -Continue to hold Pradaxa .   -Once cleared from GI standpoint and chest tube removed, will need cardiology recommendations regarding anticoagulation.  Patient follows Dr. Waddell.    Non-anion gap metabolic acidosis: - Resolved.   Hyponatremia:  - Resolved.   Epistaxis: Chronic issue likely from Pradaxa . - Resolved.  -S/p  Praxibind on 12/28 and vitamin K  on 12/28 and 12/30, and FFP on 12/29 -H&H stable   Gross hematuria: - Likely due to coagulopathy.  Resolved. -Dr Kathrin discussed with urology, Dr. Watt on 12/30, was recommended outpatient follow-up.    Thrombocytopenia:  -Resolved.   Physical deconditioning -PT/OT-recommended CIR   Hyperlipidemia -Continue rosuvastatin     Elevated INR: 9.5 on 12/28>> 1.5 -S/p Praxbind , vitamin K  and FFP.   Morbid obesity:  Estimated body mass index is 36.91 kg/m as calculated from the following:   Height as of this encounter: 5' 8 (1.727 m).   Weight as of this encounter: 110.1 kg.  Code Status: Full code DVT Prophylaxis:  SCDs Start: 09/24/23 1503   Level of Care: Level of care: Stepdown Family Communication: Updated patient's wife wife at the bedside Disposition Plan:      Remains inpatient appropriate:      Procedures:  2D echo 09/25/2023: EF 55 to 60%, no WMA, diastolic parameters normal 12/31-right thoracocentesis with removal of 150 cc dark bloody appearing fluid 1/1-right chest tube by pulmonology 1/2-EGD with 2 gastric AVMs, 1 of which oozing and treated with APC's 1/6: CT-guided right percutaneous total drainage catheter by IR  Consultants:   PCCM GI IR  Antimicrobials:    Anti-infectives (From admission, onward)    Start     Dose/Rate Route Frequency Ordered Stop   10/05/23 1800  piperacillin -tazobactam (ZOSYN ) IVPB 3.375 g        3.375 g 12.5 mL/hr over 240 Minutes Intravenous Every 8 hours 10/05/23 1028     10/05/23 1115  piperacillin -tazobactam (ZOSYN ) IVPB 3.375 g        3.375 g 100 mL/hr over 30 Minutes Intravenous  Once 10/05/23 1028 10/05/23 1256          Medications  carvedilol   3.125 mg Oral BID WC  Chlorhexidine  Gluconate Cloth  6 each Topical Daily   cyanocobalamin   500 mcg Oral Daily   isosorbide  mononitrate  60 mg Oral Daily   pantoprazole  (PROTONIX ) IV  40 mg Intravenous Q12H   QUEtiapine   25 mg Oral QHS   sodium chloride  flush  10 mL Intravenous Q12H   sodium chloride  flush  10 mL Intrapleural Q8H   sodium chloride  HYPERTONIC  4 mL Nebulization BID   tamsulosin   0.4 mg Oral Daily      Subjective:   Joshua [name] was seen and examined today morning.  Somewhat somnolent, easily arousable and responds to questions and verbal commands.  Very debilitated, wife at the bedside.  No pill-rolling hand movements noted today.  Objective:   Vitals:   10/06/23 0200 10/06/23 0300 10/06/23 0400 10/06/23 0800  BP: (!) 115/44  (!) 110/52   Pulse: (!) 52  81   Resp: (!) 23  (!) 24   Temp:  98.1 F (36.7 C)  98.5 F (36.9 C)  TempSrc:  Oral  Axillary  SpO2: 100%  100%   Weight:      Height:        Intake/Output Summary (Last 24 hours) at 10/06/2023 1027 Last data filed at 10/06/2023 0550 Gross per 24 hour  Intake 709.89 ml  Output 3590 ml  Net -2880.11 ml     Wt Readings from Last 3 Encounters:  10/05/23 110.1 kg  04/27/23 115.2 kg  03/18/23 119.9 kg   Physical Exam General: Somnolent but easily arousable, follows commands, wife at the bedside. Cardiovascular: S1 S2 clear, RRR.  Respiratory: Diminished right base, chest tube in place Gastrointestinal: Soft, nontender, nondistended, NBS Ext: bilaterally UE edema,  anasarca Neuro: no new deficits Psych: flat affect  Data Reviewed:  I have personally reviewed following labs    CBC Lab Results  Component Value Date   WBC 5.0 10/06/2023   RBC 2.71 (L) 10/06/2023   HGB 8.4 (L) 10/06/2023   HCT 26.1 (L) 10/06/2023   MCV 96.3 10/06/2023   MCH 31.0 10/06/2023   PLT 162 10/06/2023   MCHC 32.2 10/06/2023   RDW 15.6 (H) 10/06/2023   LYMPHSABS 0.8 03/18/2023   MONOABS 0.4 03/18/2023   EOSABS 0.3 03/18/2023   BASOSABS 0.0 03/18/2023     Last metabolic panel Lab Results  Component Value Date   NA 136 10/06/2023   K 3.6 10/06/2023   CL 101 10/06/2023   CO2 27 10/06/2023   BUN 32 (H) 10/06/2023   CREATININE 1.49 (H) 10/06/2023   GLUCOSE 101 (H) 10/06/2023   GFRNONAA 49 (L) 10/06/2023   GFRAA >60 04/26/2018   CALCIUM  7.8 (L) 10/06/2023   PHOS 2.1 (L) 09/30/2023   PROT 5.7 (L) 10/01/2023   ALBUMIN  2.1 (L) 10/01/2023   LABGLOB 2.3 06/15/2022   BILITOT 0.8 10/01/2023   ALKPHOS 32 (L) 10/01/2023   AST 22 10/01/2023   ALT 11 10/01/2023   ANIONGAP 8 10/06/2023    CBG (last 3)  No results for input(s): GLUCAP in the last 72 hours.     Coagulation Profile: Recent Labs  Lab 09/30/23 0316 10/04/23 1338  INR 1.5* 1.2     Radiology Studies: I have personally reviewed the imaging studies  DG Chest Port 1 View Result Date: 10/05/2023 CLINICAL DATA:  Follow-up pleural effusion EXAM: PORTABLE CHEST 1 VIEW COMPARISON:  10/05/2023 FINDINGS: Pleural catheter remains in place inferiorly on the right. Possible slight decrease in the amount of pleural fluid on the right although a  moderate amount does persist. Dependent atelectasis. No pneumothorax. Mild left base atelectasis. IMPRESSION: Possible slight decrease in the amount of pleural fluid on the right although a moderate amount does persist. Dependent atelectasis. Electronically Signed   By: Oneil Officer M.D.   On: 10/05/2023 14:33   DG Chest Port 1 View Result Date: 10/05/2023 CLINICAL  DATA:  [id number] Chest tube in place [id number] EXAM: PORTABLE CHEST 1 VIEW COMPARISON:  Chest XR and CT chest, 10/02/2023.  IR CT, 10/04/2023. FINDINGS: Support lines: Interval removal of prior large bore tube and insertion of a basilar pigtail thoracostomy tube within the RIGHT chest. Cardiomegaly. Hypoinflation. The LEFT lung is relatively clear. Similar degree of inflation of the RIGHT lung with unchanged small volume RIGHT apical pneumothorax. RIGHT perihilar opacities and basilar consolidation with small volume pleural effusion. No interval osseous abnormality. IMPRESSION: 1. Interval removal of large bore tube and insertion of a basilar pigtail thoracostomy tube within the RIGHT chest. 2. Similar, small volume RIGHT apical pneumothorax. 3. RIGHT basilar consolidation and small volume pleural effusion Electronically Signed   By: Thom Hall M.D.   On: 10/05/2023 08:59   CT [facility] PLEURAL DRAIN W/INDWELL CATH W/IMG GUIDE Result Date: 10/04/2023 INDICATION: Prior mal-positioned chest tube tube. Request percutaneous thoracostomy [id number] Empyema (HCC) [id number] EXAM: CT-GUIDED RIGHT PIGTAIL THORACOSTOMY TUBE PLACEMENT COMPARISON:  CT chest, 10/02/2023.  Chest XR, 10/02/2023. MEDICATIONS: The patient is currently admitted to the hospital and receiving intravenous antibiotics. The antibiotics were administered within an appropriate time frame prior to the initiation of the procedure. ANESTHESIA/SEDATION: Moderate (conscious) sedation was employed during this procedure. A total of Versed  2 mg and Fentanyl  100 mcg was administered intravenously. Moderate Sedation Time: 20 minutes. The patient's level of consciousness and vital signs were monitored continuously by radiology nursing throughout the procedure under my direct supervision. CONTRAST:  None COMPLICATIONS: None immediate. FLUOROSCOPY TIME:  CT dose; 954 mGycm PROCEDURE: RADIATION DOSE REDUCTION: This exam was performed according to the departmental dose-optimization  program which includes automated exposure control, adjustment of the mA and/or kV according to patient size and/or use of iterative reconstruction technique. Informed written consent was obtained from the patient and/or patient's representative after a discussion of the risks, benefits and alternatives to treatment. The patient was placed supine on the CT gantry and a pre procedural CT was performed re-demonstrating the known abscess/fluid collection within the RIGHT chest. The procedure was planned. A timeout was performed prior to the initiation of the procedure. The RIGHT chest was prepped and draped in the usual sterile fashion. The overlying soft tissues were anesthetized with 1% lidocaine  with epinephrine. Appropriate trajectory was planned with the use of a 22 gauge spinal needle. An 18 gauge trocar needle was advanced into the abscess/fluid collection and a short Amplatz super stiff wire was coiled within the collection. Appropriate positioning was confirmed with a limited CT scan. The tract was serially dilated allowing placement of a 14 Fr drainage catheter. Appropriate positioning was confirmed with a limited postprocedural CT scan. 15 ml of thick serosanguineous fluid was aspirated. The tube was connected to a pleura vac and sutured in place. A dressing was placed. The patient tolerated the procedure well without immediate post procedural complication. IMPRESSION: Successful CT guided placement of a 14 Fr pigtail thoracostomy drainage catheter into the LEFT chest with aspiration of 15 mL of thick serosanguineous fluid. Samples were sent to the laboratory as requested by the ordering clinical team. PLAN: Additional care including fibrinolysis per Pulmonology team. Thom Hall, MD  [facility] Radiology Electronically Signed   By: Thom Hall M.D.   On: 10/04/2023 17:40        Lurine Imel M.D. [facility] 10/06/2023, 10:27 AM  Available via  Epic secure chat 7am-7pm After 7 pm, please refer to night coverage provider listed on amion.

## 2023-10-06 NOTE — TOC Progression Note (Signed)
 Transition of Care [facility]) - Progression Note    Patient Details  Name: Joshua [name] MRN: [id number] Date of Birth: [birth year]  Transition of Care [facility]) CM/SW Contact  [name], [place] Phone Number: 10/06/2023, 5:45 PM  Clinical Narrative:     TOC continuing to follow patient for any TOC needs.  PT recommending SNF for short term rehab, once patient is closer to being medically ready for discharge.   Expected Discharge Plan: Home/Self Care Barriers to Discharge: No Barriers Identified  Expected Discharge Plan and Services       Living arrangements for the past 2 months: Single Family Home                                       Social Determinants of Health (SDOH) Interventions SDOH Screenings   Food Insecurity: No Food Insecurity (09/24/2023)  Housing: Low Risk  (09/25/2023)  Transportation Needs: No Transportation Needs (09/24/2023)  Utilities: Not At Risk (09/24/2023)  Social Connections: Patient Unable To Answer (09/27/2023)  Tobacco Use: Medium Risk (09/30/2023)    Readmission Risk Interventions    09/27/2023   10:04 AM  Readmission Risk Prevention Plan  Transportation Screening Complete  PCP or Specialist Appt within 5-7 Days Complete  Home Care Screening Complete  Medication Review (RN CM) Complete

## 2023-10-06 NOTE — Progress Notes (Signed)
 Brief Pharmacy Note-  Pt with only 1 IV access site currently and orders for Zosyn  & albumin  infusions.    Switching IV Zosyn  3.375gm IV q6h over while receiving IV albumin  overnight.   Will re-evaluate IV access tomorrow and change back to ext-interval Zosyn  infusions when/if appropriate.   Rosaline Millet, PharmD, BCPS 10/06/2023@9 :35 PM

## 2023-10-06 NOTE — Plan of Care (Signed)
  Problem: Clinical Measurements: Goal: Ability to maintain clinical measurements within normal limits will improve Outcome: Progressing Goal: Respiratory complications will improve Outcome: Progressing Goal: Cardiovascular complication will be avoided Outcome: Progressing   Problem: Activity: Goal: Risk for activity intolerance will decrease Outcome: Progressing   Problem: Coping: Goal: Level of anxiety will decrease Outcome: Progressing   Problem: Elimination: Goal: Will not experience complications related to bowel motility Outcome: Progressing   Problem: Pain Management: Goal: General experience of comfort will improve Outcome: Progressing   Problem: Safety: Goal: Non-violent Restraint(s) Outcome: Progressing

## 2023-10-06 NOTE — Progress Notes (Signed)
 PT Cancellation Note  Patient Details Name: Joshua [name] MRN: [id number] DOB: April 22, 1951   Cancelled Treatment:    Reason Eval/Treat Not Completed: Medical issues which prohibited therapy  Per RN.  Joshua Joshua PT Acute Rehabilitation Services Office [phone] Weekend [phone]  Joshua [name] [name] 10/06/2023, 2:04 PM

## 2023-10-06 NOTE — Progress Notes (Signed)
 OT Cancellation Note  Patient Details Name: Joshua [name] MRN: [id number] DOB: 1950/10/01   Cancelled Treatment:    Reason Eval/Treat Not Completed: Medical issues which prohibited therapy Nurse asking for therapy to hold off reporting increased lethargy and agitation this afternoon. OT to continue to follow. Geofm LEYLAND, MS Acute Rehabilitation Department Office# [phone]  10/06/2023, 2:43 PM

## 2023-10-07 ENCOUNTER — Inpatient Hospital Stay (HOSPITAL_COMMUNITY): Payer: Medicare HMO

## 2023-10-07 DIAGNOSIS — D62 Acute posthemorrhagic anemia: Secondary | ICD-10-CM | POA: Diagnosis not present

## 2023-10-07 DIAGNOSIS — N179 Acute kidney failure, unspecified: Secondary | ICD-10-CM | POA: Diagnosis not present

## 2023-10-07 DIAGNOSIS — K922 Gastrointestinal hemorrhage, unspecified: Secondary | ICD-10-CM | POA: Diagnosis not present

## 2023-10-07 DIAGNOSIS — J942 Hemothorax: Secondary | ICD-10-CM | POA: Diagnosis not present

## 2023-10-07 DIAGNOSIS — J9601 Acute respiratory failure with hypoxia: Secondary | ICD-10-CM | POA: Diagnosis not present

## 2023-10-07 DIAGNOSIS — K921 Melena: Secondary | ICD-10-CM | POA: Diagnosis not present

## 2023-10-07 LAB — ANCA TITERS
Atypical P-ANCA titer: <1:20 {titer}
C-ANCA: <1:20 {titer}
P-ANCA: <1:20 {titer}

## 2023-10-07 LAB — BASIC METABOLIC PANEL
Anion gap: 8 (ref 5–15)
BUN: 31 mg/dL — ABNORMAL HIGH (ref 8–23)
CO2: 24 mmol/L (ref 22–32)
Calcium: 7.7 mg/dL — ABNORMAL LOW (ref 8.9–10.3)
Chloride: 105 mmol/L (ref 98–111)
Creatinine, Ser: 1.34 mg/dL — ABNORMAL HIGH (ref 0.61–1.24)
GFR, Estimated: 56 mL/min — ABNORMAL LOW (ref >60)
Glucose, Bld: 97 mg/dL (ref 70–99)
Potassium: 3.4 mmol/L — ABNORMAL LOW (ref 3.5–5.1)
Sodium: 137 mmol/L (ref 135–145)

## 2023-10-07 LAB — CBC
HCT: 23.8 % — ABNORMAL LOW (ref 39.0–52.0)
Hemoglobin: 7.5 g/dL — ABNORMAL LOW (ref 13.0–17.0)
MCH: 30.9 pg (ref 26.0–34.0)
MCHC: 31.5 g/dL (ref 30.0–36.0)
MCV: 97.9 fL (ref 80.0–100.0)
Platelets: 122 10*3/uL — ABNORMAL LOW (ref 150–400)
RBC: 2.43 MIL/uL — ABNORMAL LOW (ref 4.22–5.81)
RDW: 15.8 % — ABNORMAL HIGH (ref 11.5–15.5)
WBC: 4.7 10*3/uL (ref 4.0–10.5)
nRBC: 0 % (ref 0.0–0.2)

## 2023-10-07 LAB — TYPE AND SCREEN
ABO/RH(D): A POS
Antibody Screen: NEGATIVE

## 2023-10-07 LAB — ANA: Anti Nuclear Antibody (ANA): NEGATIVE

## 2023-10-07 LAB — HEMOGLOBIN AND HEMATOCRIT, BLOOD
HCT: 27.9 % — ABNORMAL LOW (ref 39.0–52.0)
Hemoglobin: 8.4 g/dL — ABNORMAL LOW (ref 13.0–17.0)

## 2023-10-07 MED ORDER — PIPERACILLIN-TAZOBACTAM 3.375 G IVPB
3.3750 g | Freq: Three times a day (TID) | INTRAVENOUS | Status: AC
  Administered 2023-10-07 – 2023-10-11 (×14): 3.375 g via INTRAVENOUS
  Filled 2023-10-07 (×14): qty 50

## 2023-10-07 MED ORDER — STERILE WATER FOR INJECTION IJ SOLN
5.0000 mg | Freq: Once | RESPIRATORY_TRACT | Status: DC
  Filled 2023-10-07: qty 5

## 2023-10-07 MED ORDER — POTASSIUM CHLORIDE 10 MEQ/100ML IV SOLN
10.0000 meq | Freq: Once | INTRAVENOUS | Status: AC
  Administered 2023-10-07: 10 meq via INTRAVENOUS
  Filled 2023-10-07: qty 100

## 2023-10-07 MED ORDER — SODIUM CHLORIDE (PF) 0.9 % IJ SOLN
10.0000 mg | Freq: Once | INTRAMUSCULAR | Status: AC
  Administered 2023-10-07: 10 mg via INTRAPLEURAL
  Filled 2023-10-07: qty 10

## 2023-10-07 MED ORDER — STERILE WATER FOR INJECTION IJ SOLN
5.0000 mg | Freq: Once | RESPIRATORY_TRACT | Status: AC
  Administered 2023-10-07: 5 mg via INTRAPLEURAL
  Filled 2023-10-07: qty 5

## 2023-10-07 MED ORDER — MIDODRINE HCL 5 MG PO TABS
5.0000 mg | ORAL_TABLET | Freq: Three times a day (TID) | ORAL | Status: DC
  Administered 2023-10-07 – 2023-10-08 (×3): 5 mg via ORAL
  Filled 2023-10-07 (×4): qty 1

## 2023-10-07 MED ORDER — DEXMEDETOMIDINE HCL IN NACL 200 MCG/50ML IV SOLN
0.0000 ug/kg/h | INTRAVENOUS | Status: DC
  Administered 2023-10-07: 0.8 ug/kg/h via INTRAVENOUS
  Filled 2023-10-07: qty 50

## 2023-10-07 MED ORDER — PROCHLORPERAZINE EDISYLATE 10 MG/2ML IJ SOLN
5.0000 mg | Freq: Four times a day (QID) | INTRAMUSCULAR | Status: DC | PRN
  Administered 2023-10-07: 5 mg via INTRAVENOUS
  Filled 2023-10-07: qty 2

## 2023-10-07 MED ORDER — SODIUM CHLORIDE 0.9% FLUSH
10.0000 mL | Freq: Three times a day (TID) | INTRAVENOUS | Status: DC
  Administered 2023-10-07: 10 mL via INTRAPLEURAL

## 2023-10-07 MED ORDER — SODIUM CHLORIDE (PF) 0.9 % IJ SOLN
10.0000 mg | Freq: Once | INTRAMUSCULAR | Status: DC
  Filled 2023-10-07: qty 10

## 2023-10-07 NOTE — Progress Notes (Signed)
 Occupational Therapy Discharge Patient Details Name: Joshua [name] MRN: [id number] DOB: October 19, 1950 Today's Date: 10/07/2023 Time:  -     Patient discharged from OT services secondary to MD discontinued orders.  Please see latest therapy progress note for current level of functioning and progress toward goals.           Kennth Mliss Helling 10/07/2023, 9:32 AM  Mliss HERO, OTR/L Acute Rehabilitation Services Office: [phone]

## 2023-10-07 NOTE — Plan of Care (Signed)
  Problem: Clinical Measurements: Goal: Respiratory complications will improve Outcome: Progressing   Problem: Nutrition: Goal: Adequate nutrition will be maintained Outcome: Progressing   Problem: Safety: Goal: Non-violent Restraint(s) Outcome: Progressing

## 2023-10-07 NOTE — Plan of Care (Signed)
  Problem: Skin Integrity: Goal: Risk for impaired skin integrity will decrease Outcome: Progressing   Problem: Coping: Goal: Ability to adjust to condition or change in health will improve Outcome: Progressing   Problem: Metabolic: Goal: Ability to maintain appropriate glucose levels will improve Outcome: Progressing   Problem: Nutritional: Goal: Maintenance of adequate nutrition will improve Outcome: Progressing Goal: Progress toward achieving an optimal weight will improve Outcome: Progressing   Problem: Skin Integrity: Goal: Risk for impaired skin integrity will decrease Outcome: Progressing   Problem: Tissue Perfusion: Goal: Adequacy of tissue perfusion will improve Outcome: Progressing

## 2023-10-07 NOTE — Progress Notes (Signed)
 Physical Therapy Discharge Patient Details Name: Joshua [name] MRN: [id number] DOB: 12-31-1950 Today's Date: 10/07/2023 Time:  -     Patient discharged from PT services secondary to medical decline -  MD discontinued. Please see latest therapy progress note for current level of functioning and progress toward goals.    Darice Potters PT Acute Rehabilitation Services Office [phone] Weekend [phone]  GP     Potters Darice Norris 10/07/2023, 7:33 AM

## 2023-10-07 NOTE — Progress Notes (Signed)
 NAME:  Joshua [name], MRN:  [id number], DOB:  28-Sep-1951, LOS: 13 ADMISSION DATE:  09/24/2023, CONSULTATION DATE:  09/28/23 REFERRING MD:  Mignon Bump, MD, CHIEF COMPLAINT:  Pleural effusion   History of Present Illness:  73 year old male with AF/flutter on Pradaxa , CAD s/p stents in 2009, DM2, CKD IIIA, HTN, HLD, OA OSA not on CPAP with presented with weakness, dizziness, SOB and melena admitted to [facility] for symptomatic anemia secondary to GI bleed and AKI. Since hospitalization he has received IVF, PRBC x 2U, PPI, vitamin K , praxibind and FFP. Planned for EGD today or tomorrow.  Yesterday had increased O2 requirement to 5L associated with epistaxis. Seen by [facility] and improved to 2L via [place] after blowing out nasal blood clots. CXR demonstrated loculated right pleural effusion. PCCM consulted for evaluation for thoracentesis.  Has shortness of breath with movement. Improved with oxygen. Denies past history of pleural effusion or need for thoracentesis  Pertinent  Medical History  AF/flutter on Pradaxa , CAD s/p stents in 2009, DM2, CKD IIIA, HTN, HLD, OA OSA not on CPAP  Significant Hospital Events: Including procedures, antibiotic start and stop dates in addition to other pertinent events   S/p thoracentesis 09/28/23 with 150 cc blood fluid removed 1/1 large bore  Chest CT with complex right pleura gas and fluid collection and atelectasis of right lung. Right chest tube extending into right major fissues1/4 Chest tube  1/4 CT pulled back. Still not much output 1/6 CT removed. Spoke w/ Thoracic surg. Rec'd CT guided CT which was placed by IR. Cultures repeated  1/7 added zosyn  for right sided infiltrate and purulent sputum. CT put out total of 475 ml since placed 1/6. Still exhibiting delirium. Stopping the sinemet . Getting lasix . Starting IS/Flutter and HT NaCl nebs 1/8 Family at bedside, reports delirium is better. He is currently sleeping.  Chest tube put out 240 in past 24 hours.  1/9 Rested  well overnight, delirium appears improved. out from chest in the last 24 hours   Interim History / Subjective:  Resting in bed, reports some mild ABD and chest tube insertion site discomfort. Family at bedside   Objective   Blood pressure (!) 131/41, pulse 95, temperature (!) 97 F (36.1 C), temperature source Axillary, resp. rate (!) 22, height 5' 8 (1.727 m), weight 110.1 kg, SpO2 97%.        Intake/Output Summary (Last 24 hours) at 10/07/2023 0732 Last data filed at 10/07/2023 9390 Gross per 24 hour  Intake 386.83 ml  Output 1660 ml  Net -1273.17 ml   Filed Weights   10/03/23 0442 10/04/23 0500 10/05/23 0500  Weight: 112.8 kg 113.4 kg 110.1 kg   Physical Exam: General: Acute on chronically ill appearing deconditioned elderly male lying in bed, in NAD HEENT: [place]/AT, MM pink/moist, PERRL,  Neuro: Alert and oreinted x2, sleepy this am but easily arousable  CV: s1s2 regular rate and rhythm, no murmur, rubs, or gallops,  PULM:  Clear to ascultation, no increased work of breathing, chest tube to suction  GI: soft, bowel sounds active in all 4 quadrants, non-tender, non-distended, tolerating oral diet  Extremities: warm/dry, no edema  Skin: no rashes or lesions  Resolved Hospital Problem list   N/A  Assessment & Plan:  Acute hypoxic resp failure  epistaxis (resolved) Aspiration PNA w/ R basilar ASD  hemothorax (spont 2/2 coagulopathy) Small R apical ptx  Acute on chronic renal failure CKD stage IIIB H/o CAD HTN DM type II Acute delirium  Pulm prob list  Acute hypoxic resp failure 2/2 epistaxis and resultant aspiration, spont hemothorax (INR was 9.5) still w/ complicated right pleural space w/ on-going posterior fluid collection  -recent elevated INR. S/p thora 12/31 and large bore chest tube 1/1  -no malignant cells on cyto, removed 1/6, CT guided tube placed 1/6. Tmax 98.5, wbc ct not up but now w/ infiltrate and purulent sputum on 1/7 P: Routine chest tube care   Monitor chest tube output  Continue empiric Zosyn   Tentative plan for pleural lytics trial later today  Continue hypertonic nebs and encourage pulmonary hygiene  Mobilize as able Continue to hold Pradaxa  Daily CXR while chest tube is in place  Trend CBC   Acute delirium - Improving  -confused. Has pill rolling type movements (acutely) started on sinemet  1/5  Qtc 426 P: Continue supproitve care  Sinemet  stopped 1/7 Seroquel  stopped 1/8  Best Practice (right click and Reselect all SmartList Selections daily)   Diet/type: Regular consistency (see orders) DVT prophylaxis not indicated in setting of bleed Pressure ulcer(s): pressure ulcer assessment deferred  GI prophylaxis: PPI Lines: N/A Foley:  N/A Code Status:  full code Last date of multidisciplinary goals of care discussion [ per primary]  Critical care time: NA  Wesly Whisenant D. Harris, NP-C [facility] Personal contact information can be found on Amion  If no contact or response made please call [phone] 10/07/2023, 7:33 AM

## 2023-10-07 NOTE — Progress Notes (Addendum)
 eLink Physician-Brief Progress Note Patient Name: Joshua [name] DOB: [birth year] MRN: [id number]   Date of Service  10/07/2023  HPI/Events of Note  18M with AF/flutter on pradaxa , CAD s/p stents, DMII, CKDIIIA, hypertension, and OSA who is admitted initially for GI bleeding s/p EGD and right pleural effusion concerning for hemothorax s/p chest tube placement.  New mental status changes and MRI brain was ordered.  Patient has significant tics and pill-rolling motions making it difficult to hold still.  Had an additional BM and is being cleaned up on camera exam  eICU Interventions  May need anesthesia for MRI.  Will try to use Precedex  for short duration and if this is ineffective at controlling movements, defers the MRI till morning.   2157 - has a hx of some kind of metallic implant in his ear placed 15-20 years ago. There was a skull x ray in 2019 and the neuroradiologist says that they can't clear the patient for the MRI until they can research it further.  No MRI for the time being.  Intervention Category Minor Interventions: Clinical assessment - ordering diagnostic tests  Desmen Schoffstall 10/07/2023, 8:47 PM

## 2023-10-07 NOTE — Progress Notes (Signed)
 [facility]                                                                              Joshua [name], is a 73 y.o. male, DOB - [birth year], FMW:[id number] Admit date - 09/24/2023    Outpatient Primary MD for the patient is Yolande Toribio MATSU, MD  LOS - 13  days  Chief Complaint  Patient presents with   Weakness   Dizziness   Shortness of Breath       Brief summary   Patient is a 73 year old M with PMH of PAF/flutter on Pradaxa , CAD s/p stents in 2009, DM-2, CKD-3A, OSA not on CPAP, HTN, HLD and osteoarthritis presenting with progressive generalized weakness, dizziness, dyspnea, melena and edema for weeks, and admitted with symptomatic anemia in the setting of GI bleed, AKI with uremia and hyponatremia.   In ED, vitals stable. Na 128. Cr 4.45 (1.4 on 8/13).  BUN 126.  Bicarb 15.  AG 12.  Hgb 7.0.  Platelets 148.  UA with microscopic hematuria.  One units of blood ordered.  Started on IV Protonix .  GI consulted.   EGD delayed due to elevated INR. IV vitamin K  and Praxibind on 12/28 and 12/30.  Also received FFP on 12/29.  Received 2 units of PRBC so far. EGD on 1/1 with 2 gastric AVMs, 1 with intermittent oozing and treated with APC.  GI recommended p.o. Protonix  40 mg twice daily for 4 weeks followed by 40 mg daily indefinitely.    Patient had an episode of increased oxygen requirement in the evening of 12/30.  Chest x-ray showed loculated right moderate pleural effusion.  PCCM consulted.  Thoracentesis yielded 150 cc dark bloody appearing fluid.  Follow-up CT chest with large right loculated pleural effusion concerning for clotted hemothorax.  PCCM placed large bore chest tube on 01/01.  TTE without significant finding. AKI improving with IV fluid.  Renal US  without acute finding.     Assessment & Plan    Principal Problem: Symptomatic ABLA due to upper GIB, right hemothorax: -Patient was on Pradaxa  for A-fib/flutter. Hgb 7.0 (was 13.1 on 6/20).  Had melena for  over 2 weeks.  Was symptomatic.  S/p IV vitamin K  and Praxibind on 12/28 and 12/30.  Also received FFP on 12/29.  Received 2 units of PRBC. -  EGD on 1/1 with 2 gastric AVMs, 1 with intermittent oozing and treated with APC.  GI recommended p.o. Protonix  40 mg BID x 4 weeks followed by 40 mg daily indefinitely.  -Continue to hold Pradaxa . -Patient follows cardiology, Dr. Waddell, outpatient, will need recommendations regarding anticoagulation once right-sided hemothorax is resolved. -Hemoglobin 7.5, trend CBC, packed RBC transfusion for Hb <7.5    Right-sided hemothorax, loculated pleural effusion: - Likely due to Pradaxa , INR supratherapeutic 9.5 on 12/27, reversed with FFP, Praxbind  - s/p thoracocentesis with removal of 150 cc dark bloody, culture-negative exudative and neutrophilic fluid. -s/p right chest tube on 1/1.   -CT chest 1/4 showed a complex right-sided pleural gas and fluid collection could represent hemopneumothorax or empyema, extensive past several atelectasis in the right lung. -CT-guided chest tube placement by IR on  1/6 -Continue empiric IV Zosyn , management per CCM  Acute respiratory failure with hypoxia:  - Due to OSA, atelectasis, hemothorax and nasal blockage from dry blood -O2 sat stable, 99% on 1 L O2   Acute delirium, sundowning, generalized debility -Still confused, very debilitated and somnolent.  Seroquel  discontinued. -PT OT evaluation recommended SNF -Palliative medicine consulted for GOC    AKI on CKD-3A/uremia:  - Cr 1.4 in 04/2023.  ATN from anemia, on Benicar  at home.  Denied NSAID use.  UA with hematuria. -   Renal US  without significant finding. -Creatinine improving to 1.3    Diabetes mellitus type 2, NIDDM with CKD-3A:  -A1c 5.5%.  CBG within normal range.      Essential hypertension:  - 2D echo 12/28 showed EF of 55 to 60%, no regional WMA  Coreg , Imdur  on hold due to hypotension.  Received IV albumin  on 9/8 -On midodrine  5 mg 3 times  daily  History of CAD s/p stent in 2009:  -Currently Coreg , Imdur , losartan on hold  -CT chest reviewed, patient has a known history of CAD status post PCI in 2009, currently has no anginal symptoms    Paroxysmal atrial fibrillation -  Per chart review, patient was switched from warfarin to Pradaxa  in 2011.   -Continue Coreg  -Continue to hold Pradaxa .   -Once cleared from GI standpoint and chest tube removed, will need cardiology recommendations regarding anticoagulation.  Patient follows Dr. Waddell.    Non-anion gap metabolic acidosis: - Resolved.   Hyponatremia:  - Resolved.  Hypokalemia -Replaced   Epistaxis: Chronic issue likely from Pradaxa . - Resolved.  -S/p  Praxibind on 12/28 and vitamin K  on 12/28 and 12/30, and FFP on 12/29   Gross hematuria: - Likely due to coagulopathy.  Resolved. -Dr Kathrin discussed with urology, Dr. Watt on 12/30, was recommended outpatient follow-up.    Thrombocytopenia:  -Resolved.    Hyperlipidemia -Continue rosuvastatin     Elevated INR: 9.5 on 12/28>> 1.5 -S/p Praxbind , vitamin K  and FFP.   Morbid obesity:  Estimated body mass index is 36.91 kg/m as calculated from the following:   Height as of this encounter: 5' 8 (1.727 m).   Weight as of this encounter: 110.1 kg.  Code Status: Full code DVT Prophylaxis:  SCDs Start: 09/24/23 1503   Level of Care: Level of care: Stepdown Family Communication: Updated patient's wife wife at the bedside today Disposition Plan:      Remains inpatient appropriate:      Procedures:  2D echo 09/25/2023: EF 55 to 60%, no WMA, diastolic parameters normal 12/31-right thoracocentesis with removal of 150 cc dark bloody appearing fluid 1/1-right chest tube by pulmonology 1/2-EGD with 2 gastric AVMs, 1 of which oozing and treated with APC's 1/6: CT-guided right percutaneous total drainage catheter by IR  Consultants:   PCCM GI IR  Antimicrobials:   Anti-infectives (From admission, onward)     Start     Dose/Rate Route Frequency Ordered Stop   10/07/23 1400  piperacillin -tazobactam (ZOSYN ) IVPB 3.375 g        3.375 g 12.5 mL/hr over 240 Minutes Intravenous Every 8 hours 10/07/23 0938     10/07/23 0200  piperacillin -tazobactam (ZOSYN ) IVPB 3.375 g  Status:  Discontinued        3.375 g 100 mL/hr over 30 Minutes Intravenous Every 6 hours 10/06/23 2130 10/07/23 0937   10/05/23 1800  piperacillin -tazobactam (ZOSYN ) IVPB 3.375 g  Status:  Discontinued        3.375 g 12.5  mL/hr over 240 Minutes Intravenous Every 8 hours 10/05/23 1028 10/06/23 2130   10/05/23 1115  piperacillin -tazobactam (ZOSYN ) IVPB 3.375 g        3.375 g 100 mL/hr over 30 Minutes Intravenous  Once 10/05/23 1028 10/05/23 1256          Medications  alteplase  (CATHFLO ACTIVASE ) 10 mg in sodium chloride  (PF) 0.9 % 30 mL  10 mg Intrapleural Once   And   dornase alfa  (PULMOZYME ) 5 mg in sterile water  (preservative free) 30 mL  5 mg Intrapleural Once   Chlorhexidine  Gluconate Cloth  6 each Topical Daily   cyanocobalamin   500 mcg Oral Daily   lactulose   20 g Oral BID   midodrine   5 mg Oral TID WC   pantoprazole  (PROTONIX ) IV  40 mg Intravenous Q12H   sodium chloride  flush  10 mL Intravenous Q12H   sodium chloride  flush  10 mL Intrapleural Q8H   sodium chloride  flush  10 mL Intrapleural Q8H   sodium chloride  HYPERTONIC  4 mL Nebulization BID   tamsulosin   0.4 mg Oral Daily      Subjective:   Joshua [name] was seen and examined today morning.  Remains confused and somnolent.  Wife at the bedside.  No fevers or chills, nausea vomiting, no acute events overnight.  Objective:   Vitals:   10/07/23 0900 10/07/23 1000 10/07/23 1100 10/07/23 1200  BP:  (!) 150/40 (!) 148/41 (!) 158/90  Pulse: (!) 59 62 (!) 56 62  Resp: 20 (!) 22 (!) 21 20  Temp:    98.6 F (37 C)  TempSrc:    Axillary  SpO2: 99% 99% 99% 99%  Weight:      Height:        Intake/Output Summary (Last 24 hours) at 10/07/2023 1359 Last data  filed at 10/07/2023 1100 Gross per 24 hour  Intake 386.83 ml  Output 2010 ml  Net -1623.17 ml     Wt Readings from Last 3 Encounters:  10/05/23 110.1 kg  04/27/23 115.2 kg  03/18/23 119.9 kg    Physical Exam General: Somnolent but easily arousable, oriented to self and person, able to recognize his wife, ill-appearing  Cardiovascular: S1 S2 clear, RRR.  Respiratory: Diminished BS at the bases, chest tube Gastrointestinal: Soft, nontender, nondistended, NBS Ext: no pedal edema bilaterally Neuro: no new deficits Psych: flat affect  Data Reviewed:  I have personally reviewed following labs    CBC Lab Results  Component Value Date   WBC 4.7 10/07/2023   RBC 2.43 (L) 10/07/2023   HGB 7.5 (L) 10/07/2023   HCT 23.8 (L) 10/07/2023   MCV 97.9 10/07/2023   MCH 30.9 10/07/2023   PLT 122 (L) 10/07/2023   MCHC 31.5 10/07/2023   RDW 15.8 (H) 10/07/2023   LYMPHSABS 0.8 03/18/2023   MONOABS 0.4 03/18/2023   EOSABS 0.3 03/18/2023   BASOSABS 0.0 03/18/2023     Last metabolic panel Lab Results  Component Value Date   NA 137 10/07/2023   K 3.4 (L) 10/07/2023   CL 105 10/07/2023   CO2 24 10/07/2023   BUN 31 (H) 10/07/2023   CREATININE 1.34 (H) 10/07/2023   GLUCOSE 97 10/07/2023   GFRNONAA 56 (L) 10/07/2023   GFRAA >60 04/26/2018   CALCIUM  7.7 (L) 10/07/2023   PHOS 2.1 (L) 09/30/2023   PROT 5.0 (L) 10/06/2023   ALBUMIN  1.7 (L) 10/06/2023   LABGLOB 2.3 06/15/2022   BILITOT 0.7 10/06/2023   ALKPHOS 36 (L) 10/06/2023  AST 25 10/06/2023   ALT 10 10/06/2023   ANIONGAP 8 10/07/2023    CBG (last 3)  No results for input(s): GLUCAP in the last 72 hours.     Coagulation Profile: Recent Labs  Lab 10/04/23 1338 10/06/23 1013  INR 1.2 1.1     Radiology Studies: I have personally reviewed the imaging studies  US  Abdomen Limited RUQ (LIVER/GB) Result Date: 10/06/2023 CLINICAL DATA:  Fatty liver EXAM: ULTRASOUND ABDOMEN LIMITED RIGHT UPPER QUADRANT COMPARISON:   09/28/2023 FINDINGS: Gallbladder: Gallbladder is partially distended with gallstone within. No wall thickening or pericholecystic fluid is noted. Negative sonographic Murphy's sign is elicited. Common bile duct: Diameter: 2.9 mm. Liver: Diffuse increased echogenicity is noted consistent with fatty infiltration. No focal mass is noted. Portal vein is patent on color Doppler imaging with normal direction of blood flow towards the liver. Other: None. IMPRESSION: Stable cholelithiasis. Fatty liver stable from the prior exam. Electronically Signed   By: Oneil Devonshire M.D.   On: 10/06/2023 23:38   DG CHEST PORT 1 VIEW Result Date: 10/06/2023 CLINICAL DATA:  Right pleural effusion. EXAM: PORTABLE CHEST 1 VIEW COMPARISON:  October 05, 2023. FINDINGS: Stable cardiac enlargement. Stable loculated right pleural effusion with associated atelectasis or scarring. Pleural drainage catheter is noted. Left lung is unremarkable. Bony thorax is unremarkable. IMPRESSION: Stable loculated right pleural effusion with associated atelectasis or scarring. Right-sided pleural drainage catheter is again noted. Electronically Signed   By: Lynwood Landy Raddle M.D.   On: 10/06/2023 12:34        Whalen Trompeter M.D. [facility] 10/07/2023, 1:59 PM  Available via Epic secure chat 7am-7pm After 7 pm, please refer to night coverage provider listed on amion.

## 2023-10-08 ENCOUNTER — Inpatient Hospital Stay (HOSPITAL_COMMUNITY): Payer: Medicare HMO

## 2023-10-08 DIAGNOSIS — J942 Hemothorax: Secondary | ICD-10-CM | POA: Diagnosis not present

## 2023-10-08 DIAGNOSIS — J9601 Acute respiratory failure with hypoxia: Secondary | ICD-10-CM | POA: Diagnosis not present

## 2023-10-08 DIAGNOSIS — E43 Unspecified severe protein-calorie malnutrition: Secondary | ICD-10-CM | POA: Diagnosis not present

## 2023-10-08 DIAGNOSIS — K921 Melena: Secondary | ICD-10-CM | POA: Diagnosis not present

## 2023-10-08 DIAGNOSIS — N179 Acute kidney failure, unspecified: Secondary | ICD-10-CM | POA: Diagnosis not present

## 2023-10-08 DIAGNOSIS — D62 Acute posthemorrhagic anemia: Secondary | ICD-10-CM | POA: Diagnosis not present

## 2023-10-08 LAB — BODY FLUID CULTURE W GRAM STAIN: Culture: NO GROWTH

## 2023-10-08 LAB — CBC
HCT: 25.4 % — ABNORMAL LOW (ref 39.0–52.0)
Hemoglobin: 8.1 g/dL — ABNORMAL LOW (ref 13.0–17.0)
MCH: 30.9 pg (ref 26.0–34.0)
MCHC: 31.9 g/dL (ref 30.0–36.0)
MCV: 96.9 fL (ref 80.0–100.0)
Platelets: 121 10*3/uL — ABNORMAL LOW (ref 150–400)
RBC: 2.62 MIL/uL — ABNORMAL LOW (ref 4.22–5.81)
RDW: 15.9 % — ABNORMAL HIGH (ref 11.5–15.5)
WBC: 5.7 10*3/uL (ref 4.0–10.5)
nRBC: 0 % (ref 0.0–0.2)

## 2023-10-08 LAB — BASIC METABOLIC PANEL
Anion gap: 9 (ref 5–15)
BUN: 24 mg/dL — ABNORMAL HIGH (ref 8–23)
CO2: 24 mmol/L (ref 22–32)
Calcium: 7.8 mg/dL — ABNORMAL LOW (ref 8.9–10.3)
Chloride: 106 mmol/L (ref 98–111)
Creatinine, Ser: 1.42 mg/dL — ABNORMAL HIGH (ref 0.61–1.24)
GFR, Estimated: 52 mL/min — ABNORMAL LOW (ref >60)
Glucose, Bld: 88 mg/dL (ref 70–99)
Potassium: 3.4 mmol/L — ABNORMAL LOW (ref 3.5–5.1)
Sodium: 139 mmol/L (ref 135–145)

## 2023-10-08 MED ORDER — ALBUMIN HUMAN 25 % IV SOLN
25.0000 g | Freq: Four times a day (QID) | INTRAVENOUS | Status: AC
  Administered 2023-10-08 – 2023-10-09 (×4): 25 g via INTRAVENOUS
  Filled 2023-10-08 (×4): qty 100

## 2023-10-08 MED ORDER — STERILE WATER FOR INJECTION IJ SOLN: 5.0000 mg | Freq: Once | RESPIRATORY_TRACT | Status: DC

## 2023-10-08 MED ORDER — STERILE WATER FOR INJECTION IJ SOLN
5.0000 mg | Freq: Once | RESPIRATORY_TRACT | Status: AC
  Administered 2023-10-08: 5 mg via INTRAPLEURAL
  Filled 2023-10-08: qty 5

## 2023-10-08 MED ORDER — SODIUM CHLORIDE (PF) 0.9 % IJ SOLN
10.0000 mg | Freq: Once | INTRAMUSCULAR | Status: AC
  Administered 2023-10-08: 10 mg via INTRAPLEURAL
  Filled 2023-10-08: qty 10

## 2023-10-08 MED ORDER — SODIUM CHLORIDE (PF) 0.9 % IJ SOLN: 10.0000 mg | Freq: Once | INTRAMUSCULAR | Status: DC

## 2023-10-08 MED ORDER — POTASSIUM CHLORIDE CRYS ER 20 MEQ PO TBCR
60.0000 meq | EXTENDED_RELEASE_TABLET | Freq: Once | ORAL | Status: AC
  Administered 2023-10-08: 60 meq via ORAL
  Filled 2023-10-08: qty 3

## 2023-10-08 MED ORDER — SODIUM CHLORIDE 0.9% FLUSH
10.0000 mL | Freq: Three times a day (TID) | INTRAVENOUS | Status: DC
  Administered 2023-10-08 (×2): 10 mL via INTRAPLEURAL

## 2023-10-08 NOTE — Plan of Care (Signed)
  Problem: Education: Goal: Knowledge of General Education information will improve Description: Including pain rating scale, medication(s)/side effects and non-pharmacologic comfort measures Outcome: Progressing   Problem: Health Behavior/Discharge Planning: Goal: Ability to manage health-related needs will improve Outcome: Progressing   Problem: Clinical Measurements: Goal: Will remain free from infection Outcome: Progressing Goal: Diagnostic test results will improve Outcome: Progressing Goal: Respiratory complications will improve Outcome: Progressing Goal: Cardiovascular complication will be avoided Outcome: Progressing   Problem: Activity: Goal: Risk for activity intolerance will decrease Outcome: Progressing   Problem: Nutrition: Goal: Adequate nutrition will be maintained Outcome: Progressing   Problem: Coping: Goal: Level of anxiety will decrease Outcome: Progressing

## 2023-10-08 NOTE — Progress Notes (Signed)
 Consult received-   Discussed with attending and pulmonology. Cancel consult for now- they will reconsult if needed.   Ocie Bob, AGNP-C Palliative Medicine  No charge

## 2023-10-08 NOTE — Progress Notes (Addendum)
 Pharmacy Antibiotic Note  Joshua [name] is a 73 y.o. male admitted on 09/24/2023 with GI hemorrhage with melena.  Pharmacy has been consulted for zosyn  dosing for HCAP. 10/08/2023 D#4 zosyn  WBC 5.7 SCr 1.42  Afebrile  Plan: Continue Zosyn  3.375g IV q8h (4 hour infusion). Add 7 day stop date per d/w Dr Edith ends 1/13 PM  Height: 5' 8 (172.7 cm) Weight: 110.1 kg (242 lb 11.6 oz) IBW/kg (Calculated) : 68.4  Temp (24hrs), Avg:98.2 F (36.8 C), Min:98 F (36.7 C), Max:98.6 F (37 C)  Recent Labs  Lab 10/04/23 0333 10/05/23 0302 10/06/23 0538 10/07/23 0302 10/08/23 0300  WBC 5.7 4.5 5.0 4.7 5.7  CREATININE 1.45* 1.27* 1.49* 1.34* 1.42*    Estimated Creatinine Clearance: 54.9 mL/min (A) (by C-G formula based on SCr of 1.42 mg/dL (H)).    Allergies  Allergen Reactions   Desipramine  Hives and Itching   Niacin Itching    Antimicrobials this admission: 1/7 Zozyn>> Dose adjustments this admission:  Microbiology results: 1/6 chest tube fluid: ngtd 12/31 pleural fluid: ngF 12/27 MRSA neg  Thank you for allowing pharmacy to be a part of this patient's care.  Rosaline IVAR Edison, Pharm.D Use secure chat for questions 10/08/2023 9:11 AM

## 2023-10-08 NOTE — Progress Notes (Signed)
 NAME:  Joshua [name], MRN:  [id number], DOB:  12/07/1950, LOS: 14 ADMISSION DATE:  09/24/2023, CONSULTATION DATE:  09/28/23 REFERRING MD:  Mignon Bump, MD, CHIEF COMPLAINT:  Pleural effusion   History of Present Illness:  73 year old male with AF/flutter on Pradaxa , CAD s/p stents in 2009, DM2, CKD IIIA, HTN, HLD, OA OSA not on CPAP with presented with weakness, dizziness, SOB and melena admitted to [facility] for symptomatic anemia secondary to GI bleed and AKI. Since hospitalization he has received IVF, PRBC x 2U, PPI, vitamin K , praxibind and FFP. Planned for EGD today or tomorrow.  Yesterday had increased O2 requirement to 5L associated with epistaxis. Seen by [facility] and improved to 2L via [place] after blowing out nasal blood clots. CXR demonstrated loculated right pleural effusion. PCCM consulted for evaluation for thoracentesis.  Has shortness of breath with movement. Improved with oxygen. Denies past history of pleural effusion or need for thoracentesis  Pertinent  Medical History  AF/flutter on Pradaxa , CAD s/p stents in 2009, DM2, CKD IIIA, HTN, HLD, OA OSA not on CPAP  Significant Hospital Events: Including procedures, antibiotic start and stop dates in addition to other pertinent events   S/p thoracentesis 09/28/23 with 150 cc blood fluid removed 1/1 large bore  Chest CT with complex right pleura gas and fluid collection and atelectasis of right lung. Right chest tube extending into right major fissues1/4 Chest tube  1/4 CT pulled back. Still not much output 1/6 CT removed. Spoke w/ Thoracic surg. Rec'd CT guided CT which was placed by IR. Cultures repeated  1/7 added zosyn  for right sided infiltrate and purulent sputum. CT put out total of 475 ml since placed 1/6. Still exhibiting delirium. Stopping the sinemet . Getting lasix . Starting IS/Flutter and HT NaCl nebs 1/8 Family at bedside, reports delirium is better. He is currently sleeping.  Chest tube put out 240 in past 24 hours.  1/9 Rested  well overnight, delirium appears improved. out from chest in the last 24 hours - received first dose of TPA/DNAse 1/10 1.4L out from chest tube  Interim History / Subjective:   No acute events overnight 1.4L chest tube output Family updated at bedside   Objective   Blood pressure (!) 114/36, pulse (!) 50, temperature 98 F (36.7 C), temperature source Oral, resp. rate (!) 26, height 5' 8 (1.727 m), weight 110.1 kg, SpO2 96%.        Intake/Output Summary (Last 24 hours) at 10/08/2023 0748 Last data filed at 10/08/2023 0530 Gross per 24 hour  Intake 128.45 ml  Output 2550 ml  Net -2421.55 ml   Filed Weights   10/03/23 0442 10/04/23 0500 10/05/23 0500  Weight: 112.8 kg 113.4 kg 110.1 kg   Physical Exam: General: Acute on chronically ill appearing deconditioned elderly male lying in bed, in NAD HEENT: [place]/AT, MM pink/moist, PERRL,  Neuro: Alert and oreinted x3, sleepy but easily arousable  CV: s1s2 regular rate and rhythm, no murmur, rubs, or gallops,  PULM:  Clear to ascultation, no increased work of breathing, chest tube to suction  GI: soft, bowel sounds active in all 4 quadrants, non-tender, non-distended, tolerating oral diet  Extremities: warm/dry, no edema  Skin: no rashes or lesions  Resolved Hospital Problem list   N/A  Assessment & Plan:  Acute hypoxic resp failure  epistaxis (resolved) Aspiration PNA w/ R basilar ASD  hemothorax (spont 2/2 coagulopathy) Small R apical ptx  Acute on chronic renal failure CKD stage IIIB H/o CAD HTN  DM type II Acute delirium    Pulm prob list  Acute hypoxic resp failure 2/2 epistaxis and resultant aspiration, spont hemothorax (INR was 9.5) still w/ complicated right pleural space w/ on-going posterior fluid collection  -recent elevated INR. S/p thora 12/31 and large bore chest tube 1/1  -no malignant cells on cyto, removed 1/6, CT guided tube placed 1/6. wbc ct not up but now w/ infiltrate and purulent sputum on  1/7 P: Routine chest tube care  Monitor chest tube output  Continue empiric Zosyn   Plan for second dose of pleural lytics today Continue hypertonic nebs and encourage pulmonary hygiene  Mobilize as able Continue to hold Pradaxa  Daily CXR while chest tube is in place  Trend CBC   Acute delirium  P: Continue supproitve care  Sinemet  stopped 1/7 Seroquel  stopped 1/8 Check CT Head today, unable to get MRI due to metal in orbit  Best Practice (right click and Reselect all SmartList Selections daily)   Diet/type: Regular consistency (see orders) DVT prophylaxis not indicated in setting of bleed Pressure ulcer(s): pressure ulcer assessment deferred  GI prophylaxis: PPI Lines: N/A Foley:  N/A Code Status:  full code Last date of multidisciplinary goals of care discussion [ per primary]  Critical care time: NA   Dorn Chill, MD [facility] Office: [phone]   See Amion for personal pager PCCM on call pager [phone] until 7pm. Please call Elink 7p-7a. [phone]

## 2023-10-08 NOTE — Procedures (Signed)
 Pleural Fibrinolytic Administration Procedure Note  Joshua [name]  [id number]  [birth year]  Date:10/08/23  Time:11:14 AM   Provider Performing:Malika Demario B Tanyiah Laurich   Procedure: Pleural Fibrinolysis Subsequent day [id number])  Indication(s) Fibrinolysis of complicated pleural effusion  Consent Risks of the procedure as well as the alternatives and risks of each were explained to the patient and/or caregiver.  Consent for the procedure was obtained.   Anesthesia None   Time Out Verified patient identification, verified procedure, site/side was marked, verified correct patient position, special equipment/implants available, medications/allergies/relevant history reviewed, required imaging and test results available.   Sterile Technique Hand hygiene, gloves   Procedure Description Existing pleural catheter was cleaned and accessed in sterile manner.  10mg  of tPA in 30cc of saline and 5mg  of dornase in 30cc of sterile water  were injected into pleural space using existing pleural catheter.  Catheter will be clamped for 1 hour and then placed back to suction.   Complications/Tolerance None; patient tolerated the procedure well.  EBL None   Specimen(s) None

## 2023-10-08 NOTE — Progress Notes (Signed)
 PROGRESS NOTE  Joshua [name] FMW:[id number] DOB: Aug 15, 1951   PCP: Yolande Toribio MATSU, MD  Patient is from: Home.  Lives with family.  DOA: 09/24/2023 LOS: 14  Chief complaints Chief Complaint  Patient presents with   Weakness   Dizziness   Shortness of Breath     Brief Narrative / Interim history:  73 year old M with PMH of PAF/flutter on Pradaxa , CAD s/p stents in 2009, DM-2, CKD-3A, OSA not on CPAP, HTN, HLD and osteoarthritis presenting with progressive generalized weakness, dizziness, dyspnea, melena and edema for weeks, and admitted with symptomatic anemia in the setting of GI bleed, AKI with uremia and hyponatremia.   In ED, vitals stable. Na 128. Cr 4.45 (1.4 on 8/13).  BUN 126.  Bicarb 15.  AG 12.  Hgb 7.0.  Platelets 148.  UA with microscopic hematuria.  One units of blood ordered.  Started on IV Protonix .  GI consulted.   EGD delayed due to elevated INR. IV vitamin K  and Praxibind on 12/28 and 12/30.  Also received FFP on 12/29.  Received 2 units of PRBC so far. EGD on 1/1 with 2 gastric AVMs, 1 with intermittent oozing and treated with APC.  GI recommended p.o. Protonix  40 mg twice daily for 4 weeks followed by 40 mg daily indefinitely.    Patient had an episode of increased oxygen requirement in the evening of 12/30.  Chest x-ray showed loculated right moderate pleural effusion.  PCCM consulted.  Thoracentesis yielded 150 cc dark bloody appearing fluid.  Follow-up CT chest with large right loculated pleural effusion concerning for clotted hemothorax.  PCCM placed large bore chest tube on 01/01.  TTE without significant finding. AKI improving with IV fluid.  Renal US  without acute finding.    Hospital course also complicated by delirium.   Subjective: Seen and examined earlier this morning.  No major events overnight of this morning.  Patient's wife and daughter at bedside.  Reported he had a better night.  He was able to feed himself morning.  Patient is awake but not  quite alert.  He is oriented to self, place and family but not time.  He reports abdominal pain and shortness of breath although he does not appear to be in that much distress.  Objective: Vitals:   10/08/23 0800 10/08/23 0900 10/08/23 1000 10/08/23 1300  BP: (!) 115/36  (!) 133/101   Pulse: (!) 49 (!) 57 (!) 123   Resp: (!) 25 (!) 28 20   Temp: 98.2 F (36.8 C)   98 F (36.7 C)  TempSrc: Oral   Oral  SpO2: 97% 94% (!) 71%   Weight:      Height:        Examination:  GENERAL: No apparent distress.  Nontoxic. HEENT: MMM.  Vision and hearing grossly intact.  NECK: Supple.  No apparent JVD.  RESP:  No IWOB.  Fair aeration bilaterally. CVS: Irregular rhythm.  Normal rate.  Heart sounds normal.  ABD/GI/GU: BS+. Abd soft, NTND.  MSK/EXT:  Moves extremities. No apparent deformity.  LUE edema. NEURO: Awake but not quite alert.  Oriented x 4 except time.  No apparent focal neuro deficit. PSYCH: Calm. Normal affect.   Procedures:  12/31-right thoracocentesis with removal of 150 cc dark bloody appearing fluid 1/1-right chest tube by pulmonology 1/2-EGD with 2 gastric AVMs, 1 of which oozing and treated with APC's  Microbiology summarized: 12/27-MRSA PCR screen negative 12/31-pleural fluid culture negative. 1/6-pleural fluid culture negative.  Assessment and plan: Symptomatic ABLA  due to upper GIB, right hemothorax: Patient was on Pradaxa  for A-fib/flutter. Hgb 7.0 (was 13.1 on 6/20).  Had melena for over 2 weeks.  Was symptomatic.  S/p IV vitamin K  and Praxibind on 12/28 and 12/30.  Also received FFP on 12/29.  Received 2 units of PRBC. EGD on 1/1 with 2 gastric AVMs, 1 with intermittent oozing and treated with APC.   -GI recommended p.o. Protonix  40 mg BID x 4 weeks followed by 40 mg daily indefinitely.  -Continue to hold Pradaxa . -Will discuss with patient's cardiologist, Dr. Waddell about long-term anticoagulation once ready to resume -Monitor H&H.  Transfuse for Hgb <8.0 given  history of CAD.   Right-sided hemothorax, loculated pleural effusion: Likely due to Pradaxa , INR supratherapeutic 9.5 on 12/27, reversed with FFP, Praxbind  -S/p thoracocentesis with removal of 150 cc dark bloody, culture-negative exudative and neutrophilic fluid. -S/p right chest tube and pleural fibrinolysis on 1/1>>.   -CT chest 1/4-complex right-sided pleural gas and fluid collection suggesting hemopneumothorax or empyema, extensive past several atelectasis in the right lung. -CT-guided chest tube placement by IR on 1/6.  -Continue empiric IV Zosyn , management per CCM   Acute respiratory failure with hypoxia:  - Due to OSA, atelectasis, hemothorax and nasal blockage from dry blood -O2 sat stable, 99% on 1 L O2     Acute delirium, sundowning, generalized debility: Seems to be waxing or waning.  He is awake but not quite alert this morning.  Fairly oriented except time.  CT head without acute finding.   -Seroquel  discontinued. -Reorientation and delirium precaution -PT OT evaluation recommended SNF   AKI on CKD-3A/uremia: Cr 1.4 in 04/2023.  ATN from anemia, on Benicar  at home.  Denied NSAID use.  UA with hematuria. Renal US  without significant finding.  AKI resolved.  Creatinine at baseline.   Diabetes mellitus type 2, NIDDM with CKD-3A:  -A1c 5.5%.  CBG within normal range.     Essential hypertension: BP within acceptable range. Coreg , Imdur  on hold due to hypotension.  Received IV albumin  on 9/8 -On midodrine  5 mg 3 times daily   History of CAD s/p stent in 2009:  Currently Coreg , Imdur , losartan on hold due to soft blood pressure. -CT chest reviewed, patient has a known history of CAD status post PCI in 2009, currently has no anginal symptoms   Paroxysmal atrial fibrillation: Per chart review, patient was switched from warfarin to Pradaxa  in 2011.   -Continue Coreg  -Continue to hold Pradaxa .   -Will discuss anticoagulation with patient's cardiologist, Dr. Waddell once okay to  resume from [facility] and GI standpoint.    Epistaxis: Chronic issue likely from Pradaxa .  Resolved. -S/p  Praxibind on 12/28 and vitamin K  on 12/28 and 12/30, and FFP on 12/29   Gross hematuria: Likely due to coagulopathy.  Resolved. -Discussed with urology, Dr. Watt on 12/30, was recommended outpatient follow-up.     Thrombocytopenia: Mild and stable. -Continue monitoring.    Non-anion gap metabolic acidosis: - Resolved.   Hyponatremia:  - Resolved.   Hypokalemia -Monitor replenish as appropriate.  Hyperlipidemia -Continue rosuvastatin     Elevated INR: 9.5 on 12/28>> 1.5 -S/p Praxbind , vitamin K  and FFP.   Morbid obesity:  Body mass index is 36.91 kg/m.           DVT prophylaxis:  SCDs Start: 09/24/23 1503  Code Status: Full code Family Communication: Updated patient's wife and daughter at bedside Level of care: Stepdown Status is: Inpatient Remains inpatient appropriate because: Acute blood loss anemia,  hemothorax, delirium and AKI   Final disposition: Likely home once medically stable Consultants:  Gastroenterology Pulmonology Interventional radiology  55 minutes with more than 50% spent in reviewing records, counseling patient/family and coordinating care.   Sch Meds:  Scheduled Meds:  Chlorhexidine  Gluconate Cloth  6 each Topical Daily   cyanocobalamin   500 mcg Oral Daily   lactulose   20 g Oral BID   midodrine   5 mg Oral TID WC   pantoprazole  (PROTONIX ) IV  40 mg Intravenous Q12H   sodium chloride  flush  10 mL Intravenous Q12H   sodium chloride  flush  10 mL Intrapleural Q8H   sodium chloride  flush  10 mL Intrapleural Q8H   tamsulosin   0.4 mg Oral Daily   Continuous Infusions:  albumin  human 25 g (10/08/23 1043)   piperacillin -tazobactam (ZOSYN )  IV Stopped (10/08/23 0924)   PRN Meds:.acetaminophen  **OR** acetaminophen , hydrocortisone  cream, melatonin, ondansetron  **OR** ondansetron  (ZOFRAN ) IV, mouth rinse, prochlorperazine , sodium chloride ,  sodium chloride  flush  Antimicrobials: Anti-infectives (From admission, onward)    Start     Dose/Rate Route Frequency Ordered Stop   10/07/23 1400  piperacillin -tazobactam (ZOSYN ) IVPB 3.375 g        3.375 g 12.5 mL/hr over 240 Minutes Intravenous Every 8 hours 10/07/23 0938 10/11/23 2359   10/07/23 0200  piperacillin -tazobactam (ZOSYN ) IVPB 3.375 g  Status:  Discontinued        3.375 g 100 mL/hr over 30 Minutes Intravenous Every 6 hours 10/06/23 2130 10/07/23 0937   10/05/23 1800  piperacillin -tazobactam (ZOSYN ) IVPB 3.375 g  Status:  Discontinued        3.375 g 12.5 mL/hr over 240 Minutes Intravenous Every 8 hours 10/05/23 1028 10/06/23 2130   10/05/23 1115  piperacillin -tazobactam (ZOSYN ) IVPB 3.375 g        3.375 g 100 mL/hr over 30 Minutes Intravenous  Once 10/05/23 1028 10/05/23 1256        I have personally reviewed the following labs and images: CBC: Recent Labs  Lab 10/04/23 0333 10/05/23 0302 10/06/23 0538 10/07/23 0302 10/07/23 2137 10/08/23 0300  WBC 5.7 4.5 5.0 4.7  --  5.7  HGB 8.2* 7.6* 8.4* 7.5* 8.4* 8.1*  HCT 27.2* 25.3* 26.1* 23.8* 27.9* 25.4*  MCV 101.1* 100.4* 96.3 97.9  --  96.9  PLT 153 152 162 122*  --  121*   BMP &GFR Recent Labs  Lab 10/04/23 0333 10/05/23 0302 10/06/23 0538 10/07/23 0302 10/08/23 0300  NA 140 140 136 137 139  K 4.0 3.6 3.6 3.4* 3.4*  CL 109 108 101 105 106  CO2 22 24 27 24 24   GLUCOSE 95 86 101* 97 88  BUN 38* 32* 32* 31* 24*  CREATININE 1.45* 1.27* 1.49* 1.34* 1.42*  CALCIUM  8.2* 8.0* 7.8* 7.7* 7.8*   Estimated Creatinine Clearance: 54.9 mL/min (A) (by C-G formula based on SCr of 1.42 mg/dL (H)). Liver & Pancreas: Recent Labs  Lab 10/06/23 1229  AST 25  ALT 10  ALKPHOS 36*  BILITOT 0.7  PROT 5.0*  ALBUMIN  1.7*   No results for input(s): LIPASE, AMYLASE in the last 168 hours. Recent Labs  Lab 10/06/23 1229  AMMONIA 44*   Diabetic: No results for input(s): HGBA1C in the last 72 hours. No  results for input(s): GLUCAP in the last 168 hours. Cardiac Enzymes: No results for input(s): CKTOTAL, CKMB, CKMBINDEX, TROPONINI in the last 168 hours. No results for input(s): PROBNP in the last 8760 hours. Coagulation Profile: Recent Labs  Lab 10/04/23 1338 10/06/23 1013  INR 1.2 1.1   Thyroid Function Tests: No results for input(s): TSH, T4TOTAL, FREET4, T3FREE, THYROIDAB in the last 72 hours. Lipid Profile: No results for input(s): CHOL, HDL, LDLCALC, TRIG, CHOLHDL, LDLDIRECT in the last 72 hours. Anemia Panel: No results for input(s): VITAMINB12, FOLATE, FERRITIN, TIBC, IRON, RETICCTPCT in the last 72 hours. Urine analysis:    Component Value Date/Time   COLORURINE YELLOW 09/24/2023 1735   APPEARANCEUR HAZY (A) 09/24/2023 1735   LABSPEC 1.012 09/24/2023 1735   PHURINE 5.0 09/24/2023 1735   GLUCOSEU NEGATIVE 09/24/2023 1735   GLUCOSEU NEGATIVE 09/09/2009 1651   HGBUR LARGE (A) 09/24/2023 1735   BILIRUBINUR NEGATIVE 09/24/2023 1735   KETONESUR NEGATIVE 09/24/2023 1735   PROTEINUR 100 (A) 09/24/2023 1735   UROBILINOGEN 0.2 09/09/2009 1651   NITRITE NEGATIVE 09/24/2023 1735   LEUKOCYTESUR NEGATIVE 09/24/2023 1735   Sepsis Labs: Invalid input(s): PROCALCITONIN, LACTICIDVEN  Microbiology: Recent Results (from the past 240 hours)  Body fluid culture w Gram Stain     Status: None   Collection Time: 10/04/23  5:26 PM   Specimen: Chest; Body Fluid  Result Value Ref Range Status   Specimen Description   Final    CHEST Performed at [facility], [address] [place], [place] [address]    Special Requests   Final    NONE Performed at [facility], [address] [place], [place] [address]    Gram Stain   Final    RARE WBC PRESENT, PREDOMINANTLY PMN NO ORGANISMS SEEN    Culture   Final    NO GROWTH 3 DAYS Performed at [facility], [address], [place], [place]  [address]    Report Status 10/08/2023 FINAL  Final    Radiology Studies: CT HEAD WO CONTRAST ( ) Result Date: 10/08/2023 CLINICAL DATA:  Altered mental status EXAM: CT HEAD WITHOUT CONTRAST TECHNIQUE: Contiguous axial images were obtained from the base of the skull through the vertex without intravenous contrast. RADIATION DOSE REDUCTION: This exam was performed according to the departmental dose-optimization program which includes automated exposure control, adjustment of the mA and/or kV according to patient size and/or use of iterative reconstruction technique. COMPARISON:  None Available. FINDINGS: Evaluation is limited by motion artifact, despite attempts to repeat the scan. Brain: No evidence of acute infarction, hemorrhage, mass, mass effect, or midline shift. No hydrocephalus or extra-axial fluid collection. Vascular: No hyperdense vessel. Skull: Negative for fracture or focal lesion. Sinuses/Orbits: No acute finding. IMPRESSION: Motion limited. Within this limitation, no acute intracranial process. Electronically Signed   By: Donald Campion M.D.   On: 10/08/2023 11:29   DG CHEST PORT 1 VIEW Result Date: 10/08/2023 CLINICAL DATA:  Pleural effusion. EXAM: PORTABLE CHEST 1 VIEW COMPARISON:  10/06/2023 FINDINGS: Low volume film. The cardio pericardial silhouette is enlarged. There is pulmonary vascular congestion without overt pulmonary edema. Collapse/consolidative opacity in the right mid and lower lung is similar to prior with right pleural effusion. Right pleural drain remains in place. Probable trace pneumothorax right apex. Telemetry leads overlie the chest. IMPRESSION: 1. Low volume film with pulmonary vascular congestion. 2. Right pleural drainage catheter again noted with probable trace apical right pneumothorax. 3. Persistent collapse/consolidative opacity in the right mid and lower lung with right pleural effusion. Electronically Signed   By: Camellia Candle M.D.   On: 10/08/2023 07:16       Simrit Gohlke T. Dawayne Ohair [facility]  If 7PM-7AM, please contact night-coverage [email or web address] 10/08/2023, 1:35 PM

## 2023-10-09 DIAGNOSIS — R410 Disorientation, unspecified: Secondary | ICD-10-CM

## 2023-10-09 DIAGNOSIS — K921 Melena: Secondary | ICD-10-CM | POA: Diagnosis not present

## 2023-10-09 DIAGNOSIS — E43 Unspecified severe protein-calorie malnutrition: Secondary | ICD-10-CM | POA: Diagnosis not present

## 2023-10-09 DIAGNOSIS — N179 Acute kidney failure, unspecified: Secondary | ICD-10-CM | POA: Diagnosis not present

## 2023-10-09 DIAGNOSIS — J90 Pleural effusion, not elsewhere classified: Secondary | ICD-10-CM | POA: Diagnosis not present

## 2023-10-09 DIAGNOSIS — D62 Acute posthemorrhagic anemia: Secondary | ICD-10-CM | POA: Diagnosis not present

## 2023-10-09 LAB — BASIC METABOLIC PANEL
Anion gap: 8 (ref 5–15)
BUN: 18 mg/dL (ref 8–23)
CO2: 22 mmol/L (ref 22–32)
Calcium: 8.1 mg/dL — ABNORMAL LOW (ref 8.9–10.3)
Chloride: 106 mmol/L (ref 98–111)
Creatinine, Ser: 1.07 mg/dL (ref 0.61–1.24)
GFR, Estimated: >60 mL/min (ref >60)
Glucose, Bld: 86 mg/dL (ref 70–99)
Potassium: 3.5 mmol/L (ref 3.5–5.1)
Sodium: 136 mmol/L (ref 135–145)

## 2023-10-09 LAB — MAGNESIUM: Magnesium: 1.7 mg/dL (ref 1.7–2.4)

## 2023-10-09 LAB — CBC
HCT: 25.9 % — ABNORMAL LOW (ref 39.0–52.0)
Hemoglobin: 8.4 g/dL — ABNORMAL LOW (ref 13.0–17.0)
MCH: 31.1 pg (ref 26.0–34.0)
MCHC: 32.4 g/dL (ref 30.0–36.0)
MCV: 95.9 fL (ref 80.0–100.0)
Platelets: 126 10*3/uL — ABNORMAL LOW (ref 150–400)
RBC: 2.7 MIL/uL — ABNORMAL LOW (ref 4.22–5.81)
RDW: 15.8 % — ABNORMAL HIGH (ref 11.5–15.5)
WBC: 4.6 10*3/uL (ref 4.0–10.5)
nRBC: 0 % (ref 0.0–0.2)

## 2023-10-09 MED ORDER — ORAL CARE MOUTH RINSE
15.0000 mL | OROMUCOSAL | Status: DC
  Administered 2023-10-10: 15 mL via OROMUCOSAL

## 2023-10-09 MED ORDER — ORAL CARE MOUTH RINSE
15.0000 mL | OROMUCOSAL | Status: DC | PRN
Start: 2023-10-09 — End: 2023-10-10

## 2023-10-09 NOTE — Progress Notes (Signed)
 PROGRESS NOTE  Joshua [name] FMW:[id number] DOB: 08-20-1951   PCP: Yolande Toribio MATSU, MD  Patient is from: Home.  Lives with family.  DOA: 09/24/2023 LOS: 15  Chief complaints Chief Complaint  Patient presents with   Weakness   Dizziness   Shortness of Breath     Brief Narrative / Interim history:  73 year old M with PMH of PAF/flutter on Pradaxa , CAD s/p stents in 2009, DM-2, CKD-3A, OSA not on CPAP, HTN, HLD and osteoarthritis presenting with progressive generalized weakness, dizziness, dyspnea, melena and edema for weeks, and admitted with symptomatic anemia in the setting of GI bleed, AKI with uremia and hyponatremia.   In ED, vitals stable. Na 128. Cr 4.45 (1.4 on 8/13).  BUN 126.  Bicarb 15.  AG 12.  Hgb 7.0.  Platelets 148.  UA with microscopic hematuria.  One units of blood ordered.  Started on IV Protonix .  GI consulted.   EGD delayed due to elevated INR. IV vitamin K  and Praxibind on 12/28 and 12/30.  Also received FFP on 12/29.  Received 2 units of PRBC so far. EGD on 1/1 with 2 gastric AVMs, 1 with intermittent oozing and treated with APC.  GI recommended p.o. Protonix  40 mg twice daily for 4 weeks followed by 40 mg daily indefinitely.    Patient had an episode of increased oxygen requirement in the evening of 12/30.  Chest x-ray showed loculated right moderate pleural effusion.  PCCM consulted.  Thoracentesis yielded 150 cc dark bloody appearing fluid.  Follow-up CT chest with large right loculated pleural effusion concerning for clotted hemothorax.  PCCM placed large bore chest tube on 01/01.  TTE without significant finding. AKI resolved.  Renal US  without acute finding.    Hospital course also complicated by delirium.   Subjective: Seen and examined earlier this morning.  No major events overnight of this morning.  Reports pain all over.  Not able to localize.  He has bilateral mittens.  When asked about today's date, he says hell. When asked why he felt today is  hell, he says, because I am a devil.  Patient's wife at bedside.  About 1 L output from chest tube in the last 24 hours.  Objective: Vitals:   10/09/23 0800 10/09/23 0900 10/09/23 1000 10/09/23 1100  BP: (!) 154/52 (!) 149/65 (!) 149/82 (!) 163/47  Pulse: 67  (!) 57   Resp: 20 (!) 24 19 19   Temp: 98.1 F (36.7 C)     TempSrc: Oral     SpO2: 100%  100% 95%  Weight:      Height:        Examination:  GENERAL: No apparent distress.  Nontoxic. HEENT: MMM.  Vision and hearing grossly intact.  NECK: Supple.  No apparent JVD.  RESP:  No IWOB.  Fair aeration bilaterally. CVS: Irregular rhythm.  Normal rate.  Heart sounds normal.  ABD/GI/GU: BS+. Abd soft, NTND.  MSK/EXT:  Moves extremities. No apparent deformity.  Trace bilateral upper extremity edema. NEURO: Wake but not quite alert.  Difficult to assess orientation.  Follows commands.  No apparent focal neuro deficit. PSYCH: Calm. Normal affect.   Procedures:  12/31-right thoracocentesis with removal of 150 cc dark bloody appearing fluid 1/1-right chest tube by pulmonology 1/2-EGD with 2 gastric AVMs, 1 of which oozing and treated with APC's  Microbiology summarized: 12/27-MRSA PCR screen negative 12/31-pleural fluid culture negative. 1/6-pleural fluid culture negative.  Assessment and plan: Symptomatic ABLA due to upper GIB, right hemothorax: Patient was  on Pradaxa  for A-fib/flutter. Hgb 7.0 (was 13.1 on 6/20).  Had melena for over 2 weeks.  Was symptomatic.  S/p IV vitamin K  and Praxibind on 12/28 and 12/30.  Also received FFP on 12/29.  Received 2 units of PRBC. EGD on 1/1 with 2 gastric AVMs, 1 with intermittent oozing and treated with APC.   -GI recommended p.o. Protonix  40 mg BID x 4 weeks followed by 40 mg daily indefinitely.  -Continue to hold Pradaxa . -Will discuss with patient's cardiologist, Dr. Waddell about long-term anticoagulation once ready to resume -Monitor H&H.  Transfuse for Hgb <8.0 given history of CAD.    Right-sided hemothorax, loculated pleural effusion: Likely due to Pradaxa , INR supratherapeutic 9.5 on 12/27, reversed with FFP, Praxbind  -S/p thora with removal of 150 cc dark bloody, culture-negative exudative and neutrophilic fluid.  Cytology negative -S/p right chest tube and pleural fibrinolysis on 1/1-1/6 but no significant output.   -CT chest on 1/4 suggests hemopneumothorax or empyema, extensive past several atelectasis in the right lung. -CT-guided chest tube placement by IR on 1/6 after discussion with CVTS.  CT output about 1 L in the last 24 hours. -Continue empiric IV Zosyn , management per CCM   Acute respiratory failure with hypoxia:  - Due to OSA, atelectasis, hemothorax and nasal blockage from dry blood -O2 sat stable, 99% on 1 L O2     Acute delirium, sundowning, generalized debility: Seems to be waxing or waning.   CT head without acute finding.  He is awake but not quite alert this morning.  Also disoriented. -Seroquel  discontinued. -Reorientation and delirium precaution -PT/OT evaluation recommended SNF   AKI on CKD-3A/uremia: Cr 1.4 in 04/2023.  ATN from anemia, on Benicar  at home.  Denied NSAID use.  UA with hematuria. Renal US  without significant finding.  AKI resolved.  Creatinine at baseline.  Morning lab pending.   NIDDM-2 with CKD-3A: A1c 5.5%.  BMP glucose within normal range.   Essential hypertension: BP within acceptable range. Coreg , Imdur  on hold due to hypotension. -Discontinue midodrine .   History of CAD s/p stent in 2009:  Currently Coreg , Imdur , losartan on hold due to soft blood pressure. -CT chest reviewed, patient has a known history of CAD s/p PCI in 2009, currently has no anginal symptoms   Paroxysmal A-fib with bradycardia: Per chart review, patient was switched from warfarin to Pradaxa  in 2011.   -Continue to hold Coreg  and Pradaxa .   -Will discuss with patient's cardiologist, Dr. Waddell once okay to resume anticoagulation from PCCM and GI  standpoint.    Epistaxis: Chronic issue likely from Pradaxa .  Resolved. -S/p  Praxibind on 12/28 and vitamin K  on 12/28 and 12/30, and FFP on 12/29   Gross hematuria: Likely due to coagulopathy.  Resolved. -Discussed with urology, Dr. Watt on 12/30, was recommended outpatient follow-up.     Thrombocytopenia: Mild and stable. -Continue monitoring.    Non-anion gap metabolic acidosis: Resolved.   Hyponatremia: Resolved.   Hypokalemia  -Monitor replenish as appropriate.  Hyperlipidemia -Continue rosuvastatin     Elevated INR: 9.5 on 12/28>> 1.5 -S/p Praxbind , vitamin K  and FFP.   Morbid obesity:  Body mass index is 36.91 kg/m.           DVT prophylaxis:  SCDs Start: 09/24/23 1503  Code Status: Full code Family Communication: Updated patient's wife at bedside. Level of care: Stepdown Status is: Inpatient Remains inpatient appropriate because: Acute blood loss anemia, hemothorax, delirium and AKI   Final disposition: Likely home once medically stable  Consultants:  Gastroenterology Pulmonology Interventional radiology  55 minutes with more than 50% spent in reviewing records, counseling patient/family and coordinating care.   Sch Meds:  Scheduled Meds:  Chlorhexidine  Gluconate Cloth  6 each Topical Daily   cyanocobalamin   500 mcg Oral Daily   lactulose   20 g Oral BID   pantoprazole  (PROTONIX ) IV  40 mg Intravenous Q12H   sodium chloride  flush  10 mL Intravenous Q12H   sodium chloride  flush  10 mL Intrapleural Q8H   tamsulosin   0.4 mg Oral Daily   Continuous Infusions:  piperacillin -tazobactam (ZOSYN )  IV Stopped (10/09/23 0914)   PRN Meds:.acetaminophen  **OR** acetaminophen , hydrocortisone  cream, melatonin, ondansetron  **OR** ondansetron  (ZOFRAN ) IV, mouth rinse, prochlorperazine , sodium chloride , sodium chloride  flush  Antimicrobials: Anti-infectives (From admission, onward)    Start     Dose/Rate Route Frequency Ordered Stop   10/07/23 1400   piperacillin -tazobactam (ZOSYN ) IVPB 3.375 g        3.375 g 12.5 mL/hr over 240 Minutes Intravenous Every 8 hours 10/07/23 0938 10/11/23 2359   10/07/23 0200  piperacillin -tazobactam (ZOSYN ) IVPB 3.375 g  Status:  Discontinued        3.375 g 100 mL/hr over 30 Minutes Intravenous Every 6 hours 10/06/23 2130 10/07/23 0937   10/05/23 1800  piperacillin -tazobactam (ZOSYN ) IVPB 3.375 g  Status:  Discontinued        3.375 g 12.5 mL/hr over 240 Minutes Intravenous Every 8 hours 10/05/23 1028 10/06/23 2130   10/05/23 1115  piperacillin -tazobactam (ZOSYN ) IVPB 3.375 g        3.375 g 100 mL/hr over 30 Minutes Intravenous  Once 10/05/23 1028 10/05/23 1256        I have personally reviewed the following labs and images: CBC: Recent Labs  Lab 10/04/23 0333 10/05/23 0302 10/06/23 0538 10/07/23 0302 10/07/23 2137 10/08/23 0300  WBC 5.7 4.5 5.0 4.7  --  5.7  HGB 8.2* 7.6* 8.4* 7.5* 8.4* 8.1*  HCT 27.2* 25.3* 26.1* 23.8* 27.9* 25.4*  MCV 101.1* 100.4* 96.3 97.9  --  96.9  PLT 153 152 162 122*  --  121*   BMP &GFR Recent Labs  Lab 10/04/23 0333 10/05/23 0302 10/06/23 0538 10/07/23 0302 10/08/23 0300  NA 140 140 136 137 139  K 4.0 3.6 3.6 3.4* 3.4*  CL 109 108 101 105 106  CO2 22 24 27 24 24   GLUCOSE 95 86 101* 97 88  BUN 38* 32* 32* 31* 24*  CREATININE 1.45* 1.27* 1.49* 1.34* 1.42*  CALCIUM  8.2* 8.0* 7.8* 7.7* 7.8*   Estimated Creatinine Clearance: 54.9 mL/min (A) (by C-G formula based on SCr of 1.42 mg/dL (H)). Liver & Pancreas: Recent Labs  Lab 10/06/23 1229  AST 25  ALT 10  ALKPHOS 36*  BILITOT 0.7  PROT 5.0*  ALBUMIN  1.7*   No results for input(s): LIPASE, AMYLASE in the last 168 hours. Recent Labs  Lab 10/06/23 1229  AMMONIA 44*   Diabetic: No results for input(s): HGBA1C in the last 72 hours. No results for input(s): GLUCAP in the last 168 hours. Cardiac Enzymes: No results for input(s): CKTOTAL, CKMB, CKMBINDEX, TROPONINI in the last 168  hours. No results for input(s): PROBNP in the last 8760 hours. Coagulation Profile: Recent Labs  Lab 10/04/23 1338 10/06/23 1013  INR 1.2 1.1   Thyroid Function Tests: No results for input(s): TSH, T4TOTAL, FREET4, T3FREE, THYROIDAB in the last 72 hours. Lipid Profile: No results for input(s): CHOL, HDL, LDLCALC, TRIG, CHOLHDL, LDLDIRECT in the last 72 hours.  Anemia Panel: No results for input(s): VITAMINB12, FOLATE, FERRITIN, TIBC, IRON, RETICCTPCT in the last 72 hours. Urine analysis:    Component Value Date/Time   COLORURINE YELLOW 09/24/2023 1735   APPEARANCEUR HAZY (A) 09/24/2023 1735   LABSPEC 1.012 09/24/2023 1735   PHURINE 5.0 09/24/2023 1735   GLUCOSEU NEGATIVE 09/24/2023 1735   GLUCOSEU NEGATIVE 09/09/2009 1651   HGBUR LARGE (A) 09/24/2023 1735   BILIRUBINUR NEGATIVE 09/24/2023 1735   KETONESUR NEGATIVE 09/24/2023 1735   PROTEINUR 100 (A) 09/24/2023 1735   UROBILINOGEN 0.2 09/09/2009 1651   NITRITE NEGATIVE 09/24/2023 1735   LEUKOCYTESUR NEGATIVE 09/24/2023 1735   Sepsis Labs: Invalid input(s): PROCALCITONIN, LACTICIDVEN  Microbiology: Recent Results (from the past 240 hours)  Body fluid culture w Gram Stain     Status: None   Collection Time: 10/04/23  5:26 PM   Specimen: Chest; Body Fluid  Result Value Ref Range Status   Specimen Description   Final    CHEST Performed at [facility], [address] [place], [place] [address]    Special Requests   Final    NONE Performed at [facility], [address] [place], [place] [address]    Gram Stain   Final    RARE WBC PRESENT, PREDOMINANTLY PMN NO ORGANISMS SEEN    Culture   Final    NO GROWTH 3 DAYS Performed at [facility], [address], [place], [place] [address]    Report Status 10/08/2023 FINAL  Final    Radiology Studies: No results found.     Jesusita Jocelyn T. Mckinley [facility]  If 7PM-7AM, please  contact night-coverage [email or web address] 10/09/2023, 11:24 AM

## 2023-10-09 NOTE — Plan of Care (Signed)
  Problem: Clinical Measurements: Goal: Respiratory complications will improve Outcome: Progressing Goal: Cardiovascular complication will be avoided Outcome: Progressing   Problem: Elimination: Goal: Will not experience complications related to bowel motility Outcome: Progressing Goal: Will not experience complications related to urinary retention Outcome: Progressing   Problem: Safety: Goal: Ability to remain free from injury will improve Outcome: Progressing   Problem: Tissue Perfusion: Goal: Adequacy of tissue perfusion will improve Outcome: Progressing

## 2023-10-09 NOTE — Progress Notes (Signed)
 NAME:  Joshua [name], MRN:  [id number], DOB:  04-09-1951, LOS: 15 ADMISSION DATE:  09/24/2023, CONSULTATION DATE:  09/28/23 REFERRING MD:  Mignon Bump, MD, CHIEF COMPLAINT:  Pleural effusion   History of Present Illness:  73 year old male with AF/flutter on Pradaxa , CAD s/p stents in 2009, DM2, CKD IIIA, HTN, HLD, OA OSA not on CPAP with presented with weakness, dizziness, SOB and melena admitted to [facility] for symptomatic anemia secondary to GI bleed and AKI. Since hospitalization he has received IVF, PRBC x 2U, PPI, vitamin K , praxibind and FFP. Planned for EGD today or tomorrow.  Yesterday had increased O2 requirement to 5L associated with epistaxis. Seen by [facility] and improved to 2L via [place] after blowing out nasal blood clots. CXR demonstrated loculated right pleural effusion. PCCM consulted for evaluation for thoracentesis.  Has shortness of breath with movement. Improved with oxygen. Denies past history of pleural effusion or need for thoracentesis  Pertinent  Medical History  AF/flutter on Pradaxa , CAD s/p stents in 2009, DM2, CKD IIIA, HTN, HLD, OA OSA not on CPAP  Significant Hospital Events: Including procedures, antibiotic start and stop dates in addition to other pertinent events   S/p thoracentesis 09/28/23 with 150 cc blood fluid removed 1/1 large bore  Chest CT with complex right pleura gas and fluid collection and atelectasis of right lung. Right chest tube extending into right major fissues1/4 Chest tube  1/4 CT pulled back. Still not much output 1/6 CT removed. Spoke w/ Thoracic surg. Rec'd CT guided CT which was placed by IR. Cultures repeated  1/7 added zosyn  for right sided infiltrate and purulent sputum. CT put out total of 475 ml since placed 1/6. Still exhibiting delirium. Stopping the sinemet . Getting lasix . Starting IS/Flutter and HT NaCl nebs 1/8 Family at bedside, reports delirium is better. He is currently sleeping.  Chest tube put out 240 in past 24 hours.  1/9 Rested  well overnight, delirium appears improved. out from chest in the last 24 hours - received first dose of TPA/DNAse 1/10 1.4L out from chest tube  Interim History / Subjective:  -Chest tube output 1030 cc last 24 hours, received lytics #2 on 1/10 -Pleural fluid culture remains negative   Objective   Blood pressure (!) 127/41, pulse (!) 51, temperature 98.3 F (36.8 C), temperature source Axillary, resp. rate (!) 26, height 5' 8 (1.727 m), weight 110.1 kg, SpO2 97%.        Intake/Output Summary (Last 24 hours) at 10/09/2023 0814 Last data filed at 10/09/2023 0700 Gross per 24 hour  Intake 484.36 ml  Output 1830 ml  Net -1345.64 ml   Filed Weights   10/03/23 0442 10/04/23 0500 10/05/23 0500  Weight: 112.8 kg 113.4 kg 110.1 kg   Physical Exam: General: Elderly male laying in bed in no distress.  He is repeatedly spitting into emesis container HEENT: Oropharynx clear, strong voice Neuro: He is awake, answers questions but is confused, poorly oriented, very mildly agitated CV: Distant, regular, no murmur PULM: Clear bilaterally.  Chest tubes to suction with good tidal variation.  No airleak GI: nondistended with positive bowel sounds Extremities: No edema Skin: No rash  Resolved Hospital Problem list   N/A  Assessment & Plan:  Acute hypoxic resp failure  epistaxis (resolved) Aspiration PNA w/ R basilar ASD  hemothorax (spont 2/2 coagulopathy) Small R apical ptx  Acute on chronic renal failure CKD stage IIIB H/o CAD HTN DM type II Acute delirium    Pulm  prob list  Acute hypoxic resp failure 2/2 epistaxis and resultant aspiration, spont hemothorax (INR was 9.5) still w/ complicated right pleural space w/ on-going posterior fluid collection  -recent elevated INR. S/p thora 12/31 and large bore chest tube 1/1  -no malignant cells on cyto, removed 1/6, CT guided tube placed 1/6. wbc ct not up but now w/ infiltrate and purulent sputum on 1/7 P: -Continue tube drainage  complicated right pleural space.  Good output last 24 hours over 1 L -Agree with empiric Zosyn  -Pulmonary hygiene, hypertonic nebs, mobilize as able -Continue to hold Pradaxa  -Follow serial chest x-ray  Acute delirium  P: -Continue supportive care -Off Sinemet  and Seroquel  -Reorientation, correct underlying metabolic abnormalities, treat underlying conditions -Question whether he may require neuro consult going forward   Best Practice (right click and Reselect all SmartList Selections daily)   Diet/type: Regular consistency (see orders) DVT prophylaxis not indicated in setting of bleed Pressure ulcer(s): pressure ulcer assessment deferred  GI prophylaxis: PPI Lines: N/A Foley:  N/A Code Status:  full code Last date of multidisciplinary goals of care discussion [ per primary]  Critical care time: NA    Lamar Chris, MD, PhD 10/09/2023, 8:14 AM [facility] [phone] or if no answer before 7:00PM call [phone] For any issues after 7:00PM please call eLink [phone]

## 2023-10-10 ENCOUNTER — Inpatient Hospital Stay (HOSPITAL_COMMUNITY): Payer: Medicare HMO

## 2023-10-10 DIAGNOSIS — E43 Unspecified severe protein-calorie malnutrition: Secondary | ICD-10-CM | POA: Diagnosis not present

## 2023-10-10 DIAGNOSIS — J90 Pleural effusion, not elsewhere classified: Secondary | ICD-10-CM

## 2023-10-10 DIAGNOSIS — N179 Acute kidney failure, unspecified: Secondary | ICD-10-CM | POA: Diagnosis not present

## 2023-10-10 DIAGNOSIS — D62 Acute posthemorrhagic anemia: Secondary | ICD-10-CM | POA: Diagnosis not present

## 2023-10-10 DIAGNOSIS — K921 Melena: Secondary | ICD-10-CM | POA: Diagnosis not present

## 2023-10-10 LAB — HEPATIC FUNCTION PANEL
ALT: 10 U/L (ref 0–44)
AST: 20 U/L (ref 15–41)
Albumin: 3.1 g/dL — ABNORMAL LOW (ref 3.5–5.0)
Alkaline Phosphatase: 30 U/L — ABNORMAL LOW (ref 38–126)
Bilirubin, Direct: 0.3 mg/dL — ABNORMAL HIGH (ref 0.0–0.2)
Indirect Bilirubin: 1.1 mg/dL — ABNORMAL HIGH (ref 0.3–0.9)
Total Bilirubin: 1.4 mg/dL — ABNORMAL HIGH (ref 0.0–1.2)
Total Protein: 5.8 g/dL — ABNORMAL LOW (ref 6.5–8.1)

## 2023-10-10 LAB — MAGNESIUM: Magnesium: 1.8 mg/dL (ref 1.7–2.4)

## 2023-10-10 LAB — AMMONIA: Ammonia: 15 umol/L (ref 9–35)

## 2023-10-10 MED ORDER — LORAZEPAM 2 MG/ML IJ SOLN
0.5000 mg | Freq: Once | INTRAMUSCULAR | Status: AC
  Administered 2023-10-10: 0.5 mg via INTRAVENOUS
  Filled 2023-10-10: qty 1

## 2023-10-10 MED ORDER — ORAL CARE MOUTH RINSE: 15.0000 mL | OROMUCOSAL | Status: DC | PRN

## 2023-10-10 MED ORDER — METOPROLOL TARTRATE 5 MG/5ML IV SOLN: 2.5000 mg | INTRAVENOUS | Status: DC | PRN

## 2023-10-10 MED ORDER — METOPROLOL TARTRATE 12.5 MG HALF TABLET
12.5000 mg | ORAL_TABLET | Freq: Two times a day (BID) | ORAL | Status: DC
  Administered 2023-10-10 – 2023-10-11 (×2): 12.5 mg via ORAL
  Filled 2023-10-10 (×4): qty 1

## 2023-10-10 MED ORDER — ALUM & MAG HYDROXIDE-SIMETH 200-200-20 MG/5ML PO SUSP
30.0000 mL | ORAL | Status: DC | PRN
  Administered 2023-10-10: 30 mL via ORAL
  Filled 2023-10-10: qty 30

## 2023-10-10 MED ORDER — POTASSIUM CHLORIDE CRYS ER 20 MEQ PO TBCR
60.0000 meq | EXTENDED_RELEASE_TABLET | Freq: Once | ORAL | Status: AC
  Administered 2023-10-10: 60 meq via ORAL
  Filled 2023-10-10: qty 3

## 2023-10-10 MED ORDER — IOHEXOL 300 MG/ML  SOLN
75.0000 mL | Freq: Once | INTRAMUSCULAR | Status: AC | PRN
  Administered 2023-10-10: 75 mL via INTRAVENOUS

## 2023-10-10 NOTE — Plan of Care (Signed)
  Problem: Education: Goal: Knowledge of General Education information will improve Description: Including pain rating scale, medication(s)/side effects and non-pharmacologic comfort measures Outcome: Progressing   Problem: Health Behavior/Discharge Planning: Goal: Ability to manage health-related needs will improve Outcome: Progressing   Problem: Clinical Measurements: Goal: Ability to maintain clinical measurements within normal limits will improve Outcome: Progressing Goal: Will remain free from infection Outcome: Progressing Goal: Diagnostic test results will improve Outcome: Progressing Goal: Respiratory complications will improve Outcome: Progressing Goal: Cardiovascular complication will be avoided Outcome: Progressing   Problem: Activity: Goal: Risk for activity intolerance will decrease Outcome: Progressing   Problem: Nutrition: Goal: Adequate nutrition will be maintained Outcome: Progressing   Problem: Coping: Goal: Level of anxiety will decrease Outcome: Progressing   Problem: Elimination: Goal: Will not experience complications related to bowel motility Outcome: Progressing Goal: Will not experience complications related to urinary retention Outcome: Progressing   Problem: Pain Management: Goal: General experience of comfort will improve Outcome: Progressing   Problem: Safety: Goal: Ability to remain free from injury will improve Outcome: Progressing   Problem: Skin Integrity: Goal: Risk for impaired skin integrity will decrease Outcome: Progressing   Problem: Education: Goal: Ability to describe self-care measures that may prevent or decrease complications (Diabetes Survival Skills Education) will improve Outcome: Progressing Goal: Individualized Educational Video(s) Outcome: Progressing   Problem: Coping: Goal: Ability to adjust to condition or change in health will improve Outcome: Progressing   Problem: Fluid Volume: Goal: Ability to  maintain a balanced intake and output will improve Outcome: Progressing   Problem: Health Behavior/Discharge Planning: Goal: Ability to identify and utilize available resources and services will improve Outcome: Progressing Goal: Ability to manage health-related needs will improve Outcome: Progressing   Problem: Metabolic: Goal: Ability to maintain appropriate glucose levels will improve Outcome: Progressing   Problem: Nutritional: Goal: Maintenance of adequate nutrition will improve Outcome: Progressing Goal: Progress toward achieving an optimal weight will improve Outcome: Progressing   Problem: Skin Integrity: Goal: Risk for impaired skin integrity will decrease Outcome: Progressing   Problem: Tissue Perfusion: Goal: Adequacy of tissue perfusion will improve Outcome: Progressing   Problem: Safety: Goal: Non-violent Restraint(s) Outcome: Progressing

## 2023-10-10 NOTE — Plan of Care (Signed)
  Problem: Clinical Measurements: Goal: Diagnostic test results will improve Outcome: Progressing   Problem: Coping: Goal: Level of anxiety will decrease Outcome: Progressing   Problem: Tissue Perfusion: Goal: Adequacy of tissue perfusion will improve Outcome: Progressing

## 2023-10-10 NOTE — Progress Notes (Signed)
 NAME:  Joshua [name], MRN:  [id number], DOB:  09-08-1951, LOS: 16 ADMISSION DATE:  09/24/2023, CONSULTATION DATE:  09/28/23 REFERRING MD:  Mignon Bump, MD, CHIEF COMPLAINT:  Pleural effusion   History of Present Illness:  73 year old male with AF/flutter on Pradaxa , CAD s/p stents in 2009, DM2, CKD IIIA, HTN, HLD, OA OSA not on CPAP with presented with weakness, dizziness, SOB and melena admitted to [facility] for symptomatic anemia secondary to GI bleed and AKI. Since hospitalization he has received IVF, PRBC x 2U, PPI, vitamin K , praxibind and FFP. Planned for EGD today or tomorrow.  Yesterday had increased O2 requirement to 5L associated with epistaxis. Seen by [facility] and improved to 2L via [place] after blowing out nasal blood clots. CXR demonstrated loculated right pleural effusion. PCCM consulted for evaluation for thoracentesis.  Has shortness of breath with movement. Improved with oxygen. Denies past history of pleural effusion or need for thoracentesis  Pertinent  Medical History  AF/flutter on Pradaxa , CAD s/p stents in 2009, DM2, CKD IIIA, HTN, HLD, OA OSA not on CPAP  Significant Hospital Events: Including procedures, antibiotic start and stop dates in addition to other pertinent events   S/p thoracentesis 09/28/23 with 150 cc blood fluid removed 1/1 large bore  Chest CT with complex right pleura gas and fluid collection and atelectasis of right lung. Right chest tube extending into right major fissues1/4 Chest tube  1/4 CT pulled back. Still not much output 1/6 CT removed. Spoke w/ Thoracic surg. Rec'd CT guided CT which was placed by IR. Cultures repeated  1/7 added zosyn  for right sided infiltrate and purulent sputum. CT put out total of 475 ml since placed 1/6. Still exhibiting delirium. Stopping the sinemet . Getting lasix . Starting IS/Flutter and HT NaCl nebs 1/8 Family at bedside, reports delirium is better. He is currently sleeping.  Chest tube put out 240 in past 24 hours.  1/9 Rested  well overnight, delirium appears improved. out from chest in the last 24 hours - received first dose of TPA/DNAse 1/10 1.4L out from chest tube  Interim History / Subjective:  -Pigtail catheter output decreasing, 250 cc over the last 24 hours -No lytics on 1/11 -Pleural fluid culture final and negative   Objective   Blood pressure (!) 127/32, pulse 74, temperature 98.2 F (36.8 C), temperature source Oral, resp. rate (!) 27, height 5' 8 (1.727 m), weight 110.1 kg, SpO2 95%.        Intake/Output Summary (Last 24 hours) at 10/10/2023 0738 Last data filed at 10/10/2023 0700 Gross per 24 hour  Intake 170.48 ml  Output 950 ml  Net -779.52 ml   Filed Weights   10/03/23 0442 10/04/23 0500 10/05/23 0500  Weight: 112.8 kg 113.4 kg 110.1 kg   Physical Exam: General: Obese elderly gentleman laying in bed in no distress HEENT: Oropharynx clear, no secretions, strong voice Neuro: Awake, answers questions, oriented x 3.  Still with some intermittent agitation and confusion CV: No murmur, regular PULM: Clear bilaterally.  Chest tube to suction.  No airleak GI: Obese, nondistended with positive bowel sounds Extremities: Trace pretibial edema Skin: No rash  Resolved Hospital Problem list   N/A  Assessment & Plan:  Acute hypoxic resp failure  epistaxis (resolved) Aspiration PNA w/ R basilar ASD  hemothorax (spont 2/2 coagulopathy) Small R apical ptx  Acute on chronic renal failure CKD stage IIIB H/o CAD HTN DM type II Acute delirium    Pulm prob list  Acute hypoxic  resp failure 2/2 epistaxis and resultant aspiration, spont hemothorax (INR was 9.5) still w/ complicated right pleural space w/ on-going posterior fluid collection  -recent elevated INR. S/p thora 12/31 and large bore chest tube 1/1  -no malignant cells on cyto, removed 1/6, CT guided tube placed 1/6. wbc ct not up but now w/ infiltrate and purulent sputum on 1/7 P: -Output decreasing, 250 cc over the last 24  hours -Hold off on any further intrapleural lytics -Agree with empiric Zosyn  -Repeat CT scan of the chest on 1/13 to assess fluid pocket.  If output continues to decrease then we may be able to remove the tube soon -Holding Pradaxa   Acute delirium.  Suspect a component of ICU, critical illness related delirium P: -Continue supportive care -Continue to hold Sinemet  and Seroquel  -Reorientation, correct metabolic abnormalities, treat underlying conditions -Question whether he may require neuroconsult depending on trend   Best Practice (right click and Reselect all SmartList Selections daily)   Diet/type: Regular consistency (see orders) DVT prophylaxis not indicated in setting of bleed Pressure ulcer(s): pressure ulcer assessment deferred  GI prophylaxis: PPI Lines: N/A Foley:  N/A Code Status:  full code Last date of multidisciplinary goals of care discussion [ per primary]  Critical care time: NA    Lamar Chris, MD, PhD 10/10/2023, 7:38 AM [facility] [phone] or if no answer before 7:00PM call [phone] For any issues after 7:00PM please call eLink [phone]

## 2023-10-10 NOTE — Progress Notes (Signed)
 Earlier in the morning, patient stated to RN that he is DNR.  Family were not at bedside.  Later, patient's wife arrived.  RN discussed with family and patient.  Per RN, they agree on code status being changed to DNR.  I have called patient's wife on patient's home phone number and confirmed DNR status.  I have also offered palliative care consult.  Patient's wife appreciated palliative consult as well.  CODE STATUS updated in epic.

## 2023-10-10 NOTE — Progress Notes (Signed)
 PROGRESS NOTE  Joshua [name] FMW:[id number] DOB: [birth year]   PCP: Yolande Toribio MATSU, MD  Patient is from: Home.  Lives with family.  DOA: 09/24/2023 LOS: 16  Chief complaints Chief Complaint  Patient presents with   Weakness   Dizziness   Shortness of Breath     Brief Narrative / Interim history:  73 year old M with PMH of PAF/flutter on Pradaxa , CAD s/p stents in 2009, DM-2, CKD-3A, OSA not on CPAP, HTN, HLD and osteoarthritis presenting with progressive generalized weakness, dizziness, dyspnea, melena and edema for weeks, and admitted with symptomatic anemia in the setting of GI bleed, AKI with uremia and hyponatremia.   In ED, vitals stable. Na 128. Cr 4.45 (1.4 on 8/13).  BUN 126.  Bicarb 15.  AG 12.  Hgb 7.0.  Platelets 148.  UA with microscopic hematuria.  One units of blood ordered.  Started on IV Protonix .  GI consulted.   EGD delayed due to elevated INR. IV vitamin K  and Praxibind on 12/28 and 12/30.  Also received FFP on 12/29.  Received 2 units of PRBC so far. EGD on 1/1 with 2 gastric AVMs, 1 with intermittent oozing and treated with APC.  GI recommended p.o. Protonix  40 mg twice daily for 4 weeks followed by 40 mg daily indefinitely.    Patient had an episode of increased oxygen requirement in the evening of 12/30.  Chest x-ray showed loculated right moderate pleural effusion.  PCCM consulted.  Thoracentesis yielded 150 cc dark bloody appearing fluid.  Follow-up CT chest with large right loculated pleural effusion concerning for clotted hemothorax.  PCCM placed large bore chest tube on 01/01.  TTE without significant finding. AKI resolved.  Renal US  without acute finding.    Hospital course also complicated by delirium.   Subjective: Seen and examined earlier this morning.  No major events overnight.  He went into RVR earlier this morning but heart rate improved quickly after p.o. metoprolol .  He reports abdominal pain but not able to localize.  He states his breathing  is lousy.  Likes to have more breakfast.  Objective: Vitals:   10/10/23 0847 10/10/23 0900 10/10/23 1000 10/10/23 1100  BP:  (!) 135/51 (!) 135/52 (!) 139/45  Pulse: (!) 130  83 63  Resp:  (!) 23 (!) 28 (!) 34  Temp:      TempSrc:      SpO2:  93% 95% 93%  Weight:      Height:        Examination:  GENERAL: No apparent distress.  Nontoxic. HEENT: MMM.  Vision and hearing grossly intact.  NECK: Supple.  No apparent JVD.  RESP: Tachypneic to 20s and 30s.  No IWOB.  Fair aeration bilaterally but limited exam. CVS: Irregular rhythm.  Normal rate.  Heart sounds normal.  ABD/GI/GU: BS+. Abd soft, NTND.  MSK/EXT:  Moves extremities. No apparent deformity.  Trace bilateral upper extremity edema. NEURO: Awake and alert.  Oriented x 4 but confused about situations. Follows commands.  No apparent focal neuro deficit. PSYCH: Calm. Normal affect.   Procedures:  12/31-right thoracocentesis with removal of 150 cc dark bloody appearing fluid 1/1-right chest tube by pulmonology 1/2-EGD with 2 gastric AVMs, 1 of which oozing and treated with APC's  Microbiology summarized: 12/27-MRSA PCR screen negative 12/31-pleural fluid culture negative. 1/6-pleural fluid culture negative.  Assessment and plan: Symptomatic ABLA due to upper GIB, right hemothorax: Patient was on Pradaxa  for A-fib/flutter. Hgb 7.0 (was 13.1 on 6/20).  Had melena for over  2 weeks.  Was symptomatic.  S/p IV vitamin K  and Praxibind on 12/28 and 12/30.  Also received FFP on 12/29.  Received 2 units of PRBC. EGD on 1/1 with 2 gastric AVMs, 1 with intermittent oozing and treated with APC.   -GI recommended p.o. Protonix  40 mg BID x 4 weeks followed by 40 mg daily indefinitely.  -Continue to hold Pradaxa . -Will discuss with patient's cardiologist, Dr. Waddell about long-term anticoagulation once ready to resume -Monitor H&H.  Transfuse for Hgb <8.0 given history of CAD.   Right-sided hemothorax, loculated pleural effusion: Likely  due to Pradaxa , INR supratherapeutic 9.5 on 12/27, reversed with FFP, Praxbind  -S/p thora with removal of 150 cc dark bloody, culture-negative exudative and neutrophilic fluid.  Cytology negative -S/p right chest tube and pleural fibrinolysis on 1/1-1/6 but no significant output.   -CT chest on 1/4 suggests hemopneumothorax or empyema, extensive past several atelectasis in the right lung. -CT-guided chest tube placement by IR on 1/6 after discussion with CVTS.  Chest tube output decreased to 250 cc. -Continue empiric IV Zosyn , management per CCM -Follow CT chest   Acute respiratory failure with hypoxia:  - Due to OSA, atelectasis, hemothorax and nasal blockage from dry blood -O2 sat stable, 99% on 1 L O2   Acute delirium, sundowning, generalized debility: Seems to be waxing or waning.   CT head without acute finding.  Awake, alert and oriented but confused about situation.  No focal neurodeficit. -Seroquel  discontinued. -Reorientation and delirium precaution -PT/OT evaluation recommended SNF   AKI on CKD-3A/uremia: Cr 1.4 in 04/2023.  ATN from anemia, on Benicar  at home.  Denied NSAID use.  UA with hematuria. Renal US  without significant finding.  AKI resolved.  Recent Labs    09/29/23 0353 09/30/23 0316 10/01/23 0308 10/02/23 0329 10/04/23 0333 10/05/23 0302 10/06/23 0538 10/07/23 0302 10/08/23 0300 10/09/23 1532  BUN 51* 45* 37* 41* 38* 32* 32* 31* 24* 18  CREATININE 2.09* 2.03* 1.75* 1.69* 1.45* 1.27* 1.49* 1.34* 1.42* 1.07  -Monitor intermittently  NIDDM-2 with CKD-3A: A1c 5.5%.  BMP glucose within normal range.   Essential hypertension: BP within acceptable range. Coreg , Imdur  on hold due to hypotension.  Midodrine  discontinued   History of CAD s/p stent in 2009:  Currently Coreg , Imdur , losartan on hold due to soft blood pressure. -CT chest reviewed, patient has a known history of CAD s/p PCI in 2009, currently has no anginal symptoms   Paroxysmal A-fib with RVR: Per  chart review, patient was switched from warfarin to Pradaxa  in 2011.  Brief RVR this morning.  Coreg  is on hold due to bradycardia. -Low-dose metoprolol  at 12.5 mg twice daily -IV metoprolol  2.5 mg every 6 hours as needed for sustained HR > 120 -Continue holding Pradaxa /anticoagulation -Will discuss with patient's cardiologist, Dr. Waddell once okay to resume anticoagulation from PCCM and GI standpoint.  -Optimize electrolytes   Epistaxis: Chronic issue likely from Pradaxa .  Resolved. -S/p  Praxibind on 12/28 and vitamin K  on 12/28 and 12/30, and FFP on 12/29   Gross hematuria: Likely due to coagulopathy.  Resolved. -Discussed with urology, Dr. Watt on 12/30, was recommended outpatient follow-up.     Thrombocytopenia: Mild and stable. -Continue monitoring.    Non-anion gap metabolic acidosis: Resolved.   Hyponatremia: Resolved.   Hypokalemia  -Monitor replenish as appropriate.  Hyperlipidemia -Continue rosuvastatin     Elevated INR: 9.5 on 12/28>> 1.5 -S/p Praxbind , vitamin K  and FFP.   Morbid obesity:  Body mass index is 36.91 kg/m.  DVT prophylaxis:  SCDs Start: 09/24/23 1503  Code Status: Full code Family Communication: None at bedside today. Level of care: Stepdown Status is: Inpatient Remains inpatient appropriate because: Acute blood loss anemia, hemothorax, delirium and AKI   Final disposition: Likely home once medically stable Consultants:  Gastroenterology Pulmonology Interventional radiology  55 minutes with more than 50% spent in reviewing records, counseling patient/family and coordinating care.   Sch Meds:  Scheduled Meds:  Chlorhexidine  Gluconate Cloth  6 each Topical Daily   cyanocobalamin   500 mcg Oral Daily   lactulose   20 g Oral BID   metoprolol  tartrate  12.5 mg Oral BID   mouth rinse  15 mL Mouth Rinse 4 times per day   pantoprazole  (PROTONIX ) IV  40 mg Intravenous Q12H   potassium chloride   60 mEq Oral Once   sodium  chloride flush  10 mL Intravenous Q12H   sodium chloride  flush  10 mL Intrapleural Q8H   tamsulosin   0.4 mg Oral Daily   Continuous Infusions:  piperacillin -tazobactam (ZOSYN )  IV Stopped (10/10/23 0913)   PRN Meds:.acetaminophen  **OR** acetaminophen , hydrocortisone  cream, iohexol , melatonin, metoprolol  tartrate, ondansetron  **OR** ondansetron  (ZOFRAN ) IV, mouth rinse, sodium chloride , sodium chloride  flush  Antimicrobials: Anti-infectives (From admission, onward)    Start     Dose/Rate Route Frequency Ordered Stop   10/07/23 1400  piperacillin -tazobactam (ZOSYN ) IVPB 3.375 g        3.375 g 12.5 mL/hr over 240 Minutes Intravenous Every 8 hours 10/07/23 0938 10/11/23 2359   10/07/23 0200  piperacillin -tazobactam (ZOSYN ) IVPB 3.375 g  Status:  Discontinued        3.375 g 100 mL/hr over 30 Minutes Intravenous Every 6 hours 10/06/23 2130 10/07/23 0937   10/05/23 1800  piperacillin -tazobactam (ZOSYN ) IVPB 3.375 g  Status:  Discontinued        3.375 g 12.5 mL/hr over 240 Minutes Intravenous Every 8 hours 10/05/23 1028 10/06/23 2130   10/05/23 1115  piperacillin -tazobactam (ZOSYN ) IVPB 3.375 g        3.375 g 100 mL/hr over 30 Minutes Intravenous  Once 10/05/23 1028 10/05/23 1256        I have personally reviewed the following labs and images: CBC: Recent Labs  Lab 10/05/23 0302 10/06/23 0538 10/07/23 0302 10/07/23 2137 10/08/23 0300 10/09/23 1532  WBC 4.5 5.0 4.7  --  5.7 4.6  HGB 7.6* 8.4* 7.5* 8.4* 8.1* 8.4*  HCT 25.3* 26.1* 23.8* 27.9* 25.4* 25.9*  MCV 100.4* 96.3 97.9  --  96.9 95.9  PLT 152 162 122*  --  121* 126*   BMP &GFR Recent Labs  Lab 10/05/23 0302 10/06/23 0538 10/07/23 0302 10/08/23 0300 10/09/23 1532 10/10/23 0350  NA 140 136 137 139 136  --   K 3.6 3.6 3.4* 3.4* 3.5  --   CL 108 101 105 106 106  --   CO2 24 27 24 24 22   --   GLUCOSE 86 101* 97 88 86  --   BUN 32* 32* 31* 24* 18  --   CREATININE 1.27* 1.49* 1.34* 1.42* 1.07  --   CALCIUM  8.0* 7.8*  7.7* 7.8* 8.1*  --   MG  --   --   --   --  1.7 1.8   Estimated Creatinine Clearance: 72.9 mL/min (by C-G formula based on SCr of 1.07 mg/dL). Liver & Pancreas: Recent Labs  Lab 10/06/23 1229 10/10/23 0350  AST 25 20  ALT 10 10  ALKPHOS 36* 30*  BILITOT 0.7 1.4*  PROT 5.0* 5.8*  ALBUMIN  1.7* 3.1*   No results for input(s): LIPASE, AMYLASE in the last 168 hours. Recent Labs  Lab 10/06/23 1229 10/10/23 0350  AMMONIA 44* 15   Diabetic: No results for input(s): HGBA1C in the last 72 hours. No results for input(s): GLUCAP in the last 168 hours. Cardiac Enzymes: No results for input(s): CKTOTAL, CKMB, CKMBINDEX, TROPONINI in the last 168 hours. No results for input(s): PROBNP in the last 8760 hours. Coagulation Profile: Recent Labs  Lab 10/04/23 1338 10/06/23 1013  INR 1.2 1.1   Thyroid Function Tests: No results for input(s): TSH, T4TOTAL, FREET4, T3FREE, THYROIDAB in the last 72 hours. Lipid Profile: No results for input(s): CHOL, HDL, LDLCALC, TRIG, CHOLHDL, LDLDIRECT in the last 72 hours. Anemia Panel: No results for input(s): VITAMINB12, FOLATE, FERRITIN, TIBC, IRON, RETICCTPCT in the last 72 hours. Urine analysis:    Component Value Date/Time   COLORURINE YELLOW 09/24/2023 1735   APPEARANCEUR HAZY (A) 09/24/2023 1735   LABSPEC 1.012 09/24/2023 1735   PHURINE 5.0 09/24/2023 1735   GLUCOSEU NEGATIVE 09/24/2023 1735   GLUCOSEU NEGATIVE 09/09/2009 1651   HGBUR LARGE (A) 09/24/2023 1735   BILIRUBINUR NEGATIVE 09/24/2023 1735   KETONESUR NEGATIVE 09/24/2023 1735   PROTEINUR 100 (A) 09/24/2023 1735   UROBILINOGEN 0.2 09/09/2009 1651   NITRITE NEGATIVE 09/24/2023 1735   LEUKOCYTESUR NEGATIVE 09/24/2023 1735   Sepsis Labs: Invalid input(s): PROCALCITONIN, LACTICIDVEN  Microbiology: Recent Results (from the past 240 hours)  Body fluid culture w Gram Stain     Status: None   Collection Time: 10/04/23  5:26  PM   Specimen: Chest; Body Fluid  Result Value Ref Range Status   Specimen Description   Final    CHEST Performed at [facility], [address], [place], [place] [address]    Special Requests   Final    NONE Performed at [facility], [address] [place], [place] [address]    Gram Stain   Final    RARE WBC PRESENT, PREDOMINANTLY PMN NO ORGANISMS SEEN    Culture   Final    NO GROWTH 3 DAYS Performed at [facility], [address] [place], [place] [address]    Report Status 10/08/2023 FINAL  Final    Radiology Studies: No results found.     Briggitte Boline T. Agapito Hanway [facility]  If 7PM-7AM, please contact night-coverage [email or web address] 10/10/2023, 11:41 AM

## 2023-10-11 DIAGNOSIS — K921 Melena: Secondary | ICD-10-CM | POA: Diagnosis not present

## 2023-10-11 DIAGNOSIS — D62 Acute posthemorrhagic anemia: Secondary | ICD-10-CM | POA: Diagnosis not present

## 2023-10-11 DIAGNOSIS — E43 Unspecified severe protein-calorie malnutrition: Secondary | ICD-10-CM | POA: Diagnosis not present

## 2023-10-11 DIAGNOSIS — J9601 Acute respiratory failure with hypoxia: Secondary | ICD-10-CM | POA: Diagnosis not present

## 2023-10-11 DIAGNOSIS — J942 Hemothorax: Secondary | ICD-10-CM | POA: Diagnosis not present

## 2023-10-11 DIAGNOSIS — N179 Acute kidney failure, unspecified: Secondary | ICD-10-CM | POA: Diagnosis not present

## 2023-10-11 MED ORDER — SODIUM CHLORIDE 0.9% FLUSH
10.0000 mL | Freq: Three times a day (TID) | INTRAVENOUS | Status: DC
  Administered 2023-10-12 – 2023-10-15 (×11): 10 mL via INTRAPLEURAL

## 2023-10-11 MED ORDER — LORAZEPAM 0.5 MG PO TABS
0.5000 mg | ORAL_TABLET | Freq: Two times a day (BID) | ORAL | Status: DC | PRN
  Administered 2023-10-11: 0.5 mg via ORAL
  Filled 2023-10-11: qty 1

## 2023-10-11 MED ORDER — STERILE WATER FOR INJECTION IJ SOLN
5.0000 mg | Freq: Once | RESPIRATORY_TRACT | Status: AC
  Administered 2023-10-11: 5 mg via INTRAPLEURAL
  Filled 2023-10-11: qty 5

## 2023-10-11 MED ORDER — SODIUM CHLORIDE (PF) 0.9 % IJ SOLN
10.0000 mg | Freq: Once | INTRAMUSCULAR | Status: AC
  Administered 2023-10-11: 10 mg via INTRAPLEURAL
  Filled 2023-10-11: qty 10

## 2023-10-11 NOTE — Consult Note (Signed)
 Consultation Note Date: 10/11/2023   Patient Name: Joshua [name]  DOB: 05-Jul-1951  MRN: [id number]  Age / Sex: 73 y.o., male  PCP: Yolande Toribio MATSU, MD Referring Physician: Kathrin Mignon DASEN, MD  Reason for Consultation: Establishing goals of care  HPI/Patient Profile: 73 y.o. male  with past medical history of   admitted on 09/24/2023 with  .  73 year old M with PMH of PAF/flutter on Pradaxa , CAD s/p stents in 2009, DM-2, CKD-3A, OSA not on CPAP, HTN, HLD and osteoarthritis presenting with progressive generalized weakness, dizziness, dyspnea, melena and edema for weeks, and admitted with symptomatic anemia in the setting of GI bleed, AKI with uremia and hyponatremia.   Clinical Assessment and Goals of Care: Patient was admitted for ABLA due to GI bleed, hospital course complicated by hemothorax, loculated pleural effusion, acute delirium and generalized debility. Palliative care consult for ongoing goals of care discussions.  Chart reviewed, patient seen, resting in bed, wife and daughter at bedside.  Palliative medicine is specialized medical care for people living with serious illness. It focuses on providing relief from the symptoms and stress of a serious illness. The goal is to improve quality of life for both the patient and the family. Goals of care: Broad aims of medical therapy in relation to the patient's values and preferences. Our aim is to provide medical care aimed at enabling patients to achieve the goals that matter most to them, given the circumstances of their particular medical situation and their constraints.    NEXT OF KIN Patient lives at home with wife in [place], patient's daughter also lives nearby.   SUMMARY OF RECOMMENDATIONS    Goals of care discussions undertaken with wife, daughter also present at bedside. Code status now DNR with limited additional interventions. Today, at time  of initial consult, gave patient's wife brief overview of palliative versus hospice services. We talked about monitoring hospital course and following the patient's disease trajectory of illness. Wife is hopeful that the chest tube will be able to be removed in a few days and would prefer to take the patient back home towards the end of this hospitalization, if possible.  PMT to follow along. Agree with DNR, continue current mode of care.  Thank you for the consult.   Code Status/Advance Care Planning: DNR   Symptom Management:     Palliative Prophylaxis:  Delirium Protocol   Psycho-social/Spiritual:  Desire for further Chaplaincy support:yes Additional Recommendations: Caregiving  Support/Resources  Prognosis:  Unable to determine  Discharge Planning: To Be Determined      Primary Diagnoses: Present on Admission:  Paroxysmal atrial fibrillation (HCC)  Essential hypertension  Gastrointestinal hemorrhage with melena  Hyperlipidemia  Pulmonary hypertension (HCC)  Symptomatic anemia  ABLA (acute blood loss anemia)  Coronary artery disease due to lipid rich plaque  Protein-calorie malnutrition, severe (HCC)  Thrombocytosis  Hyponatremia  AKI (acute kidney injury) (HCC)  CKD stage 3a, GFR 45-59 ml/min (HCC)   I have reviewed the medical record, interviewed the patient and family, and  examined the patient. The following aspects are pertinent.  Past Medical History:  Diagnosis Date   Arthritis    Atrial fibrillation (HCC)    Atrial flutter (HCC)    Back pain    CAD (coronary artery disease)    stents placed 2009   Complication of anesthesia    woke up during surgery and ablation   DM (diabetes mellitus) (HCC)    type 2   Elevated PSA    Gout    Hearing problem    HTN (hypertension)    Hyperlipidemia    Myocardial infarct, old 2010   Prostatitis    Renal insufficiency    Social History   Socioeconomic History   Marital status: Married    Spouse name: Not  on file   Number of children: 2   Years of education: Not on file   Highest education level: Not on file  Occupational History   Occupation: SERV TECH    Employer: [employer]  Tobacco Use   Smoking status: Never   Smokeless tobacco: Former    Types: Chew  Substance and Sexual Activity   Alcohol  use: No   Drug use: No   Sexual activity: Not on file  Other Topics Concern   Not on file  Social History Narrative   Not on file   Social Drivers of Health   Financial Resource Strain: Not on file  Food Insecurity: No Food Insecurity (09/24/2023)   Hunger Vital Sign    Worried About Running Out of Food in the Last Year: Never true    Ran Out of Food in the Last Year: Never true  Transportation Needs: No Transportation Needs (09/24/2023)   PRAPARE - Administrator, Civil Service (Medical): No    Lack of Transportation (Non-Medical): No  Physical Activity: Not on file  Stress: Not on file  Social Connections: Patient Unable To Answer (09/27/2023)   Social Connection and Isolation Panel [NHANES]    Frequency of Communication with Friends and Family: Patient unable to answer    Frequency of Social Gatherings with Friends and Family: Patient unable to answer    Attends Religious Services: Patient unable to answer    Active Member of Clubs or Organizations: Patient unable to answer    Attends Banker Meetings: Patient unable to answer    Marital Status: Patient unable to answer   Family History  Problem Relation Age of Onset   Hypertension Mother    Gout Mother    Alzheimer's disease Mother    Heart failure Father    Heart disease Brother    Cancer Brother        type unknown   Scheduled Meds:  Chlorhexidine  Gluconate Cloth  6 each Topical Daily   lactulose   20 g Oral BID   metoprolol  tartrate  12.5 mg Oral BID   pantoprazole  (PROTONIX ) IV  40 mg Intravenous Q12H   sodium chloride  flush  10 mL Intravenous Q12H   sodium chloride  flush  10 mL  Intrapleural Q8H   tamsulosin   0.4 mg Oral Daily   Continuous Infusions:  piperacillin -tazobactam (ZOSYN )  IV Stopped (10/11/23 0921)   PRN Meds:.acetaminophen  **OR** acetaminophen , alum & mag hydroxide-simeth, hydrocortisone  cream, melatonin, metoprolol  tartrate, ondansetron  **OR** ondansetron  (ZOFRAN ) IV, mouth rinse, sodium chloride , sodium chloride  flush Medications Prior to Admission:  Prior to Admission medications   Medication Sig Start Date End Date Taking? Authorizing Provider  carvedilol  (COREG ) 12.5 MG tablet TAKE 1 TABLET BY MOUTH TWICE  A DAY 06/14/23  Yes Waddell Danelle ORN, MD  fenofibrate 160 MG tablet Take 160 mg by mouth daily. 06/16/20  Yes [provider]  ipratropium (ATROVENT) 0.06 % nasal spray Place 1 spray into both nostrils 3 (three) times daily as needed for rhinitis. 05/29/19  Yes [provider]  isosorbide  mononitrate (IMDUR ) 30 MG 24 hr tablet Take 30 mg by mouth daily. 03/11/23  Yes [provider]  nitroGLYCERIN  (NITROSTAT ) 0.4 MG SL tablet Place 0.4 mg under the tongue every 5 (five) minutes x 3 doses as needed for chest pain.   Yes [provider]  olmesartan  (BENICAR ) 40 MG tablet Take 1 tablet (40 mg total) by mouth daily. 04/27/23  Yes Waddell Danelle ORN, MD  pioglitazone  (ACTOS ) 15 MG tablet Take 15 mg by mouth daily.   Yes [provider]  rosuvastatin  (CRESTOR ) 20 MG tablet Take 20 mg by mouth daily. 03/30/20  Yes [provider]  TYLENOL  8 HOUR ARTHRITIS PAIN 650 MG CR tablet Take 650-1,300 mg by mouth every 8 (eight) hours as needed for pain.   Yes [provider]  vitamin B-12 (CYANOCOBALAMIN ) 500 MCG tablet Take 500 mcg by mouth daily.   Yes [provider]  PRADAXA  150 MG CAPS capsule TAKE 1 CAPSULE BY MOUTH TWICE A DAY 10/04/23   Waddell Danelle ORN, MD   Allergies  Allergen Reactions   Desipramine  Hives and Itching   Niacin Itching   Review of Systems +generalized weakness  Physical  Exam Resting in bed No distress Appears with generalized weakness Irregular Monitor noted  Vital Signs: BP (!) 129/51 (BP Location: Right Arm)   Pulse 68   Temp 98.6 F (37 C) (Oral)   Resp (!) 30   Ht 5' 8 (1.727 m)   Wt 110.1 kg   SpO2 93%   BMI 36.91 kg/m  Pain Scale: PAINAD   Pain Score: 0-No pain   SpO2: SpO2: 93 % O2 Device:SpO2: 93 % O2 Flow Rate: .O2 Flow Rate (L/min): 1 L/min  IO: Intake/output summary:  Intake/Output Summary (Last 24 hours) at 10/11/2023 1201 Last data filed at 10/11/2023 1005 Gross per 24 hour  Intake 189.63 ml  Output 1248 ml  Net -1058.37 ml    LBM: Last BM Date : 10/08/23 Baseline Weight: Weight: 83 kg Most recent weight: Weight: 110.1 kg     Palliative Assessment/Data:   PPS 50%  Time In:    11 Time Out:  12 Time Total:  60 Greater than 50%  of this time was spent counseling and coordinating care related to the above assessment and plan.  Signed by: Lonia Serve, MD   Please contact Palliative Medicine Team phone at [phone] for questions and concerns.  For individual provider: See Tracey

## 2023-10-11 NOTE — Plan of Care (Signed)
  Problem: Education: Goal: Knowledge of General Education information will improve Description: Including pain rating scale, medication(s)/side effects and non-pharmacologic comfort measures Outcome: Progressing   Problem: Health Behavior/Discharge Planning: Goal: Ability to manage health-related needs will improve Outcome: Progressing   Problem: Clinical Measurements: Goal: Ability to maintain clinical measurements within normal limits will improve Outcome: Progressing Goal: Will remain free from infection Outcome: Progressing Goal: Diagnostic test results will improve Outcome: Progressing Goal: Respiratory complications will improve Outcome: Progressing Goal: Cardiovascular complication will be avoided Outcome: Progressing   Problem: Activity: Goal: Risk for activity intolerance will decrease Outcome: Progressing   Problem: Nutrition: Goal: Adequate nutrition will be maintained Outcome: Progressing   Problem: Coping: Goal: Level of anxiety will decrease Outcome: Progressing   Problem: Elimination: Goal: Will not experience complications related to bowel motility Outcome: Progressing Goal: Will not experience complications related to urinary retention Outcome: Progressing   Problem: Pain Management: Goal: General experience of comfort will improve Outcome: Progressing   Problem: Safety: Goal: Ability to remain free from injury will improve Outcome: Progressing   Problem: Skin Integrity: Goal: Risk for impaired skin integrity will decrease Outcome: Progressing   Problem: Education: Goal: Ability to describe self-care measures that may prevent or decrease complications (Diabetes Survival Skills Education) will improve Outcome: Progressing Goal: Individualized Educational Video(s) Outcome: Progressing   Problem: Coping: Goal: Ability to adjust to condition or change in health will improve Outcome: Progressing   Problem: Fluid Volume: Goal: Ability to  maintain a balanced intake and output will improve Outcome: Progressing   Problem: Health Behavior/Discharge Planning: Goal: Ability to identify and utilize available resources and services will improve Outcome: Progressing Goal: Ability to manage health-related needs will improve Outcome: Progressing   Problem: Metabolic: Goal: Ability to maintain appropriate glucose levels will improve Outcome: Progressing   Problem: Nutritional: Goal: Maintenance of adequate nutrition will improve Outcome: Progressing Goal: Progress toward achieving an optimal weight will improve Outcome: Progressing   Problem: Skin Integrity: Goal: Risk for impaired skin integrity will decrease Outcome: Progressing   Problem: Tissue Perfusion: Goal: Adequacy of tissue perfusion will improve Outcome: Progressing   Problem: Safety: Goal: Non-violent Restraint(s) Outcome: Progressing

## 2023-10-11 NOTE — Procedures (Signed)
 Pleural Fibrinolytic Administration Procedure Note  Joshua [name]  [id number]  Nov 04, 1950  Date:10/11/23  Time:6:50 PM   Provider Performing:Keirra Zeimet B Shaaron Golliday   Procedure: Pleural Fibrinolysis Subsequent day [id number])  Indication(s) Fibrinolysis of complicated pleural effusion  Consent Risks of the procedure as well as the alternatives and risks of each were explained to the patient and/or caregiver.  Consent for the procedure was obtained.   Anesthesia None   Time Out Verified patient identification, verified procedure, site/side was marked, verified correct patient position, special equipment/implants available, medications/allergies/relevant history reviewed, required imaging and test results available.   Sterile Technique Hand hygiene, gloves   Procedure Description Existing pleural catheter was cleaned and accessed in sterile manner.  10mg  of tPA in 30cc of saline and 5mg  of dornase in 30cc of sterile water  were injected into pleural space using existing pleural catheter.  Catheter will be clamped for 1 hour and then placed back to suction.   Complications/Tolerance None; patient tolerated the procedure well.  EBL None   Specimen(s) None

## 2023-10-11 NOTE — Progress Notes (Signed)
 NAME:  Joshua [name], MRN:  [id number], DOB:  1950/10/12, LOS: 17 ADMISSION DATE:  09/24/2023, CONSULTATION DATE:  09/28/23 REFERRING MD:  Mignon Bump, MD, CHIEF COMPLAINT:  Pleural effusion   History of Present Illness:  73 year old male with AF/flutter on Pradaxa , CAD s/p stents in 2009, DM2, CKD IIIA, HTN, HLD, OA OSA not on CPAP with presented with weakness, dizziness, SOB and melena admitted to [facility] for symptomatic anemia secondary to GI bleed and AKI. Since hospitalization he has received IVF, PRBC x 2U, PPI, vitamin K , praxibind and FFP. Planned for EGD today or tomorrow.  Yesterday had increased O2 requirement to 5L associated with epistaxis. Seen by [facility] and improved to 2L via [place] after blowing out nasal blood clots. CXR demonstrated loculated right pleural effusion. PCCM consulted for evaluation for thoracentesis.  Has shortness of breath with movement. Improved with oxygen. Denies past history of pleural effusion or need for thoracentesis  Pertinent  Medical History  AF/flutter on Pradaxa , CAD s/p stents in 2009, DM2, CKD IIIA, HTN, HLD, OA OSA not on CPAP  Significant Hospital Events: Including procedures, antibiotic start and stop dates in addition to other pertinent events   S/p thoracentesis 09/28/23 with 150 cc blood fluid removed 1/1 large bore  Chest CT with complex right pleura gas and fluid collection and atelectasis of right lung. Right chest tube extending into right major fissues1/4 Chest tube  1/4 CT pulled back. Still not much output 1/6 CT removed. Spoke w/ Thoracic surg. Rec'd CT guided CT which was placed by IR. Cultures repeated  1/7 added zosyn  for right sided infiltrate and purulent sputum. CT put out total of 475 ml since placed 1/6. Still exhibiting delirium. Stopping the sinemet . Getting lasix . Starting IS/Flutter and HT NaCl nebs 1/8 Family at bedside, reports delirium is better. He is currently sleeping.  Chest tube put out 240 in past 24 hours.  1/9 Rested  well overnight, delirium appears improved. out from chest in the last 24 hours - received first dose of TPA/DNAse 1/10 1.4L out from chest tube  Interim History / Subjective:   Patient was lucid yesterday without repetitive hand movements or spitting, received ativan  0.5mg  last evening and again this morning. Patient is spitting intermittently this morning and hand movements returned. Wife and daughter at bedside.   Objective   Blood pressure (!) 129/51, pulse 68, temperature 98.6 F (37 C), temperature source Oral, resp. rate (!) 30, height 5' 8 (1.727 m), weight 110.1 kg, SpO2 93%.        Intake/Output Summary (Last 24 hours) at 10/11/2023 1311 Last data filed at 10/11/2023 1005 Gross per 24 hour  Intake 189.63 ml  Output 1248 ml  Net -1058.37 ml   Filed Weights   10/03/23 0442 10/04/23 0500 10/05/23 0500  Weight: 112.8 kg 113.4 kg 110.1 kg   Physical Exam: General: Elderly male laying in bed in no distress HEENT: Oropharynx clear, strong voice Neuro: He is awake, answers questions, follows commands CV: Distant, regular, no murmur PULM: Clear bilaterally.  Chest tubes to suction.  No airleak GI: nondistended with positive bowel sounds Extremities: No edema Skin: No rash   CT Chest 10/10/23 Lungs/Pleura: Right basilar pigtail chest tube in place. Small hemopneumothorax has decreased in size since the prior CT, with slightly smaller complex loculated components medially along the mediastinum and posterior right upper lobe (series 3, image 46). Small residual complex fluid at the right lung base near the chest tube. Improved aeration of  the right lower lobe. No consolidation.  Resolved Hospital Problem list   N/A  Assessment & Plan:  Acute hypoxic resp failure  epistaxis (resolved) Aspiration PNA w/ R basilar ASD  hemothorax (spont 2/2 coagulopathy) Small R apical ptx  Acute on chronic renal failure CKD stage IIIB H/o CAD HTN DM type II Acute delirium     Pulm prob list  Acute hypoxic resp failure 2/2 epistaxis and resultant aspiration, spont hemothorax (INR was 9.5) still w/ complicated right pleural space w/ on-going posterior fluid collection  -recent elevated INR. S/p thora 12/31 and large bore chest tube 1/1  -no malignant cells on cyto, removed 1/6, CT guided tube placed 1/6. wbc ct not up but now w/ infiltrate and purulent sputum on 1/7 P: -Continue tube drainage of complicated right pleural space.   - Will give third dose of pleural lytics today based on CT Chest results -Agree with empiric Zosyn  -Pulmonary hygiene, hypertonic nebs, mobilize as able -Continue to hold Pradaxa   Acute delirium  P: -Continue supportive care -Off Sinemet  and Seroquel  -Reorientation, correct underlying metabolic abnormalities, treat underlying conditions -Hold all narcotics and benzodiasepines   Best Practice (right click and Reselect all SmartList Selections daily)   Diet/type: Regular consistency (see orders) DVT prophylaxis not indicated in setting of bleed Pressure ulcer(s): pressure ulcer assessment deferred  GI prophylaxis: PPI Lines: N/A Foley:  N/A Code Status:  full code Last date of multidisciplinary goals of care discussion [ per primary]  Critical care time: NA    Dorn Chill, MD [facility] Office: [phone]   See Amion for personal pager PCCM on call pager [phone] until 7pm. Please call Elink 7p-7a. [phone]

## 2023-10-11 NOTE — TOC Progression Note (Addendum)
 Transition of Care [facility]) - Progression Note    Patient Details  Name: Joshua [name] MRN: [id number] Date of Birth: Jun 10, 1951  [facility] CM/SW Contact  Dalila Camellia [place], [place] Phone Number: 10/11/2023, 5:21 PM  Clinical Narrative:    Palliative following patient, goals of care discussion held today.  Patient's wife would like patient to return back home once chest tube has been removed.  TOC to continue to follow patient's progress throughout discharge planning.    [employer] delivered a rolling walker for patient on 12/31.  Patient still may need SNF once closer to being medically ready for discharge.   Expected Discharge Plan: Home/Self Care Barriers to Discharge: No Barriers Identified  Expected Discharge Plan and Services       Living arrangements for the past 2 months: Single Family Home                                       Social Determinants of Health (SDOH) Interventions SDOH Screenings   Food Insecurity: No Food Insecurity (09/24/2023)  Housing: Low Risk  (09/25/2023)  Transportation Needs: No Transportation Needs (09/24/2023)  Utilities: Not At Risk (09/24/2023)  Social Connections: Patient Unable To Answer (09/27/2023)  Tobacco Use: Medium Risk (09/30/2023)    Readmission Risk Interventions    09/27/2023   10:04 AM  Readmission Risk Prevention Plan  Transportation Screening Complete  PCP or Specialist Appt within 5-7 Days Complete  Home Care Screening Complete  Medication Review (RN CM) Complete

## 2023-10-11 NOTE — Progress Notes (Addendum)
 PROGRESS NOTE  Joshua [name] FMW:[id number] DOB: 21-Feb-1951   PCP: Yolande Toribio MATSU, MD  Patient is from: Home.  Lives with family.  DOA: 09/24/2023 LOS: 17  Chief complaints Chief Complaint  Patient presents with   Weakness   Dizziness   Shortness of Breath     Brief Narrative / Interim history:  73 year old M with PMH of PAF/flutter on Pradaxa , CAD s/p stents in 2009, DM-2, CKD-3A, OSA not on CPAP, HTN, HLD and osteoarthritis presenting with progressive generalized weakness, dizziness, dyspnea, melena and edema for weeks, and admitted with symptomatic anemia in the setting of GI bleed, AKI with uremia and hyponatremia.   In ED, vitals stable. Na 128. Cr 4.45 (1.4 on 8/13).  BUN 126.  Bicarb 15.  AG 12.  Hgb 7.0.  Platelets 148.  UA with microscopic hematuria.  One units of blood ordered.  Started on IV Protonix .  GI consulted.   EGD delayed due to elevated INR. IV vitamin K  and Praxibind on 12/28 and 12/30.  Also received FFP on 12/29.  Received 2 units of PRBC so far. EGD on 1/1 with 2 gastric AVMs, 1 with intermittent oozing and treated with APC.  GI recommended p.o. Protonix  40 mg twice daily for 4 weeks followed by 40 mg daily indefinitely.    Patient had an episode of increased oxygen requirement in the evening of 12/30.  Chest x-ray showed loculated right moderate pleural effusion.  PCCM consulted.  Thoracentesis yielded 150 cc dark bloody appearing fluid.  Follow-up CT chest with large right loculated pleural effusion concerning for clotted hemothorax.  PCCM placed bore chest tube on 1/1 but without significant output. CT chest on 1/4 suggests hemopneumothorax or empyema, extensive past several atelectasis in the right lung.  Chest tube removed and CT-guided chest tube placed on 1/6.  Treated with fibrinolytics.  Repeat CT chest on 1/12 with a small residual hemopneumothorax and improved aeration of RLL.  TTE without significant finding. AKI resolved.  Renal US  without acute  finding.    Hospital course also complicated by delirium.   Subjective: Seen and examined earlier this morning.  Patient does awake alert and oriented x 4 yesterday.  Somewhat disoriented and started spitting and fidgeting this morning.  He received some Ativan  for anxiety yesterday.   Objective: Vitals:   10/11/23 1100 10/11/23 1200 10/11/23 1300 10/11/23 1400  BP:  (!) 150/63    Pulse: 68 66    Resp: (!) 30 (!) 29 (!) 27 (!) 27  Temp:  99.4 F (37.4 C)    TempSrc:  Oral    SpO2: 93% (!) 89%    Weight:      Height:        Examination:  GENERAL: No apparent distress.  Nontoxic. HEENT: MMM.  Vision and hearing grossly intact.  NECK: Supple.  No apparent JVD.  RESP: Tachypneic to 20s and 30s.  No IWOB.  Fair aeration bilaterally but limited exam. CVS: Irregular rhythm.  Normal rate.  Heart sounds normal.  ABD/GI/GU: BS+. Abd soft, NTND.  MSK/EXT:  Moves extremities. No apparent deformity.  Trace bilateral upper extremity edema. NEURO: Awake and alert.  Oriented fairly.  No apparent focal neuro deficit. PSYCH: Fidgeting and frequently spitting.  Procedures:  12/31-right thoracocentesis with removal of 150 cc dark bloody appearing fluid 1/1-right chest tube by pulmonology 1/2-EGD with 2 gastric AVMs, 1 of which oozing and treated with APC's  Microbiology summarized: 12/27-MRSA PCR screen negative 12/31-pleural fluid culture negative. 1/6-pleural fluid culture  negative.  Assessment and plan: Symptomatic ABLA due to upper GIB, right hemothorax: Patient was on Pradaxa  for A-fib/flutter. Hgb 7.0 (was 13.1 on 6/20).  Had melena for over 2 weeks.  Was symptomatic.  S/p IV vitamin K  and Praxibind on 12/28 and 12/30.  Also received FFP on 12/29.  Received 2 units of PRBC. EGD on 1/1 with 2 gastric AVMs, 1 with intermittent oozing and treated with APC.   -GI recommended p.o. Protonix  40 mg BID x 4 weeks followed by 40 mg daily indefinitely.  -Continue to hold Pradaxa . -Will discuss  with patient's cardiologist, Dr. Waddell about long-term anticoagulation once ready to resume -Monitor H&H.  Transfuse for Hgb <8.0 given history of CAD.   Right-sided hemothorax, loculated pleural effusion: Likely due to Pradaxa , INR supratherapeutic 9.5 on 12/27, reversed with FFP, Praxbind  -S/p thora with removal of 150 cc dark bloody, culture-negative exudative and neutrophilic fluid.  Cytology negative -S/p right chest tube and pleural fibrinolysis on 1/1-1/6 but no significant output.   -CT chest on 1/4 suggests hemopneumothorax or empyema, extensive past several atelectasis in the right lung. -CT-guided chest tube placement by IR on 1/6 after discussion with CVTS.  CT output decreased to 150 cc / 24 hours. -Repeat CT chest on 1/12 with small residual hemopneumothorax and improved aeration of RLL. -Continue empiric IV Zosyn , management per CCM   Acute respiratory failure with hypoxia: Resolved. -Due to OSA, atelectasis, hemothorax and nasal blockage from dry blood   Acute delirium, sundowning, generalized debility: Seems to be waxing or waning.   CT head without acute finding.    No focal neurodeficit.  He was awake alert and oriented yesterday.  Started fidgeting, spitting and kicking today.  He received some Ativan  yesterday and this morning. -Discontinue Ativan  -Reorientation and delirium precaution -PT/OT evaluation recommended SNF   AKI on CKD-3A/uremia: Cr 1.4 in 04/2023.  ATN from anemia, on Benicar  at home.  Denied NSAID use.  UA with hematuria. Renal US  without significant finding.  AKI resolved.  Recent Labs    09/29/23 0353 09/30/23 0316 10/01/23 0308 10/02/23 0329 10/04/23 0333 10/05/23 0302 10/06/23 0538 10/07/23 0302 10/08/23 0300 10/09/23 1532  BUN 51* 45* 37* 41* 38* 32* 32* 31* 24* 18  CREATININE 2.09* 2.03* 1.75* 1.69* 1.45* 1.27* 1.49* 1.34* 1.42* 1.07  -Monitor intermittently  NIDDM-2 with CKD-3A: A1c 5.5%.  BMP glucose within normal range.   Essential  hypertension: BP within acceptable range. Coreg , Imdur  on hold due to hypotension.  Midodrine  discontinued   History of CAD s/p stent in 2009:  Currently Coreg , Imdur , losartan on hold due to soft blood pressure. -CT chest reviewed, patient has a known history of CAD s/p PCI in 2009, currently has no anginal symptoms   Paroxysmal A-fib with RVR: Per chart review, patient was switched from warfarin to Pradaxa  in 2011.  Brief RVR this morning.  Coreg  is on hold due to bradycardia. -Low-dose metoprolol  at 12.5 mg twice daily -IV metoprolol  2.5 mg every 6 hours as needed for sustained HR > 120 -Continue holding Pradaxa /anticoagulation -Will discuss with patient's cardiologist, Dr. Waddell once okay to resume anticoagulation from PCCM and GI standpoint.  -Optimize electrolytes   Epistaxis: Chronic issue likely from Pradaxa .  Resolved. -S/p  Praxibind on 12/28 and vitamin K  on 12/28 and 12/30, and FFP on 12/29   Gross hematuria: Likely due to coagulopathy.  Resolved. -Discussed with urology, Dr. Watt on 12/30, was recommended outpatient follow-up.     Thrombocytopenia: Mild and stable. -Continue  monitoring.    Non-anion gap metabolic acidosis: Resolved.   Hyponatremia: Resolved.   Hypokalemia  -Monitor replenish as appropriate.  Hyperlipidemia -Continue rosuvastatin     Elevated INR: 9.5 on 12/28>> 1.5 -S/p Praxbind , vitamin K  and FFP.   Morbid obesity:  Body mass index is 36.91 kg/m.           DVT prophylaxis:  SCDs Start: 09/24/23 1503  Code Status: DNR/DNI Family Communication: Updated patient's daughter and wife at bedside. Level of care: Stepdown Status is: Inpatient Remains inpatient appropriate because: Acute blood loss anemia, hemothorax, delirium and AKI   Final disposition: Likely home once medically stable Consultants:  Gastroenterology Pulmonology Interventional radiology  55 minutes with more than 50% spent in reviewing records, counseling  patient/family and coordinating care.   Sch Meds:  Scheduled Meds:  alteplase  (CATHFLO ACTIVASE ) 10 mg in sodium chloride  (PF) 0.9 % 30 mL  10 mg Intrapleural Once   And   dornase alfa  (PULMOZYME ) 5 mg in sterile water  (preservative free) 30 mL  5 mg Intrapleural Once   Chlorhexidine  Gluconate Cloth  6 each Topical Daily   lactulose   20 g Oral BID   metoprolol  tartrate  12.5 mg Oral BID   pantoprazole  (PROTONIX ) IV  40 mg Intravenous Q12H   sodium chloride  flush  10 mL Intravenous Q12H   sodium chloride  flush  10 mL Intrapleural Q8H   sodium chloride  flush  10 mL Intrapleural Q8H   tamsulosin   0.4 mg Oral Daily   Continuous Infusions:  piperacillin -tazobactam (ZOSYN )  IV Stopped (10/11/23 0921)   PRN Meds:.acetaminophen  **OR** acetaminophen , alum & mag hydroxide-simeth, hydrocortisone  cream, melatonin, metoprolol  tartrate, ondansetron  **OR** ondansetron  (ZOFRAN ) IV, mouth rinse, sodium chloride , sodium chloride  flush  Antimicrobials: Anti-infectives (From admission, onward)    Start     Dose/Rate Route Frequency Ordered Stop   10/07/23 1400  piperacillin -tazobactam (ZOSYN ) IVPB 3.375 g        3.375 g 12.5 mL/hr over 240 Minutes Intravenous Every 8 hours 10/07/23 0938 10/11/23 2359   10/07/23 0200  piperacillin -tazobactam (ZOSYN ) IVPB 3.375 g  Status:  Discontinued        3.375 g 100 mL/hr over 30 Minutes Intravenous Every 6 hours 10/06/23 2130 10/07/23 0937   10/05/23 1800  piperacillin -tazobactam (ZOSYN ) IVPB 3.375 g  Status:  Discontinued        3.375 g 12.5 mL/hr over 240 Minutes Intravenous Every 8 hours 10/05/23 1028 10/06/23 2130   10/05/23 1115  piperacillin -tazobactam (ZOSYN ) IVPB 3.375 g        3.375 g 100 mL/hr over 30 Minutes Intravenous  Once 10/05/23 1028 10/05/23 1256        I have personally reviewed the following labs and images: CBC: Recent Labs  Lab 10/05/23 0302 10/06/23 0538 10/07/23 0302 10/07/23 2137 10/08/23 0300 10/09/23 1532  WBC 4.5 5.0  4.7  --  5.7 4.6  HGB 7.6* 8.4* 7.5* 8.4* 8.1* 8.4*  HCT 25.3* 26.1* 23.8* 27.9* 25.4* 25.9*  MCV 100.4* 96.3 97.9  --  96.9 95.9  PLT 152 162 122*  --  121* 126*   BMP &GFR Recent Labs  Lab 10/05/23 0302 10/06/23 0538 10/07/23 0302 10/08/23 0300 10/09/23 1532 10/10/23 0350  NA 140 136 137 139 136  --   K 3.6 3.6 3.4* 3.4* 3.5  --   CL 108 101 105 106 106  --   CO2 24 27 24 24 22   --   GLUCOSE 86 101* 97 88 86  --   BUN  32* 32* 31* 24* 18  --   CREATININE 1.27* 1.49* 1.34* 1.42* 1.07  --   CALCIUM  8.0* 7.8* 7.7* 7.8* 8.1*  --   MG  --   --   --   --  1.7 1.8   Estimated Creatinine Clearance: 72.9 mL/min (by C-G formula based on SCr of 1.07 mg/dL). Liver & Pancreas: Recent Labs  Lab 10/06/23 1229 10/10/23 0350  AST 25 20  ALT 10 10  ALKPHOS 36* 30*  BILITOT 0.7 1.4*  PROT 5.0* 5.8*  ALBUMIN  1.7* 3.1*   No results for input(s): LIPASE, AMYLASE in the last 168 hours. Recent Labs  Lab 10/06/23 1229 10/10/23 0350  AMMONIA 44* 15   Diabetic: No results for input(s): HGBA1C in the last 72 hours. No results for input(s): GLUCAP in the last 168 hours. Cardiac Enzymes: No results for input(s): CKTOTAL, CKMB, CKMBINDEX, TROPONINI in the last 168 hours. No results for input(s): PROBNP in the last 8760 hours. Coagulation Profile: Recent Labs  Lab 10/06/23 1013  INR 1.1   Thyroid Function Tests: No results for input(s): TSH, T4TOTAL, FREET4, T3FREE, THYROIDAB in the last 72 hours. Lipid Profile: No results for input(s): CHOL, HDL, LDLCALC, TRIG, CHOLHDL, LDLDIRECT in the last 72 hours. Anemia Panel: No results for input(s): VITAMINB12, FOLATE, FERRITIN, TIBC, IRON, RETICCTPCT in the last 72 hours. Urine analysis:    Component Value Date/Time   COLORURINE YELLOW 09/24/2023 1735   APPEARANCEUR HAZY (A) 09/24/2023 1735   LABSPEC 1.012 09/24/2023 1735   PHURINE 5.0 09/24/2023 1735   GLUCOSEU NEGATIVE 09/24/2023  1735   GLUCOSEU NEGATIVE 09/09/2009 1651   HGBUR LARGE (A) 09/24/2023 1735   BILIRUBINUR NEGATIVE 09/24/2023 1735   KETONESUR NEGATIVE 09/24/2023 1735   PROTEINUR 100 (A) 09/24/2023 1735   UROBILINOGEN 0.2 09/09/2009 1651   NITRITE NEGATIVE 09/24/2023 1735   LEUKOCYTESUR NEGATIVE 09/24/2023 1735   Sepsis Labs: Invalid input(s): PROCALCITONIN, LACTICIDVEN  Microbiology: Recent Results (from the past 240 hours)  Body fluid culture w Gram Stain     Status: None   Collection Time: 10/04/23  5:26 PM   Specimen: Chest; Body Fluid  Result Value Ref Range Status   Specimen Description   Final    CHEST Performed at [facility], [address], [place], [place] [address]    Special Requests   Final    NONE Performed at [facility], [address] [place], [place] [address]    Gram Stain   Final    RARE WBC PRESENT, PREDOMINANTLY PMN NO ORGANISMS SEEN    Culture   Final    NO GROWTH 3 DAYS Performed at [facility], [address] [place], [place] [address]    Report Status 10/08/2023 FINAL  Final    Radiology Studies: CT CHEST W CONTRAST Result Date: 10/10/2023 CLINICAL DATA:  Spontaneous hemothorax follow-up status post chest tube drainage. EXAM: CT CHEST WITH CONTRAST TECHNIQUE: Multidetector CT imaging of the chest was performed during intravenous contrast administration. RADIATION DOSE REDUCTION: This exam was performed according to the departmental dose-optimization program which includes automated exposure control, adjustment of the mA and/or kV according to patient size and/or use of iterative reconstruction technique. CONTRAST:  75mL OMNIPAQUE  IOHEXOL  300 MG/ML  SOLN COMPARISON:  Chest x-ray dated October 08, 2023. CT chest dated October 02, 2023. FINDINGS: Cardiovascular: Unchanged mild cardiomegaly. No pericardial effusion. No thoracic aortic aneurysm or dissection. Coronary, aortic arch, and branch vessel atherosclerotic  vascular disease. No pulmonary  embolism. Mediastinum/Nodes: Prominent subcentimeter mediastinal and hilar lymph nodes are unchanged and likely reactive. Thyroid gland, trachea, and esophagus demonstrate no significant findings. Lungs/Pleura: Right basilar pigtail chest tube in place. Small hemopneumothorax has decreased in size since the prior CT, with slightly smaller complex loculated components medially along the mediastinum and posterior right upper lobe (series 3, image 46). Small residual complex fluid at the right lung base near the chest tube. Improved aeration of the right lower lobe. No consolidation. Upper Abdomen: No acute abnormality. Musculoskeletal: No chest wall abnormality. No acute or significant osseous findings. IMPRESSION: 1. Right basilar pigtail chest tube in place. Small residual hemopneumothorax, decreased in size since the prior CT. 2. Improved aeration of the right lower lobe. 3.  Aortic Atherosclerosis (ICD10-I70.0). Electronically Signed   By: Elsie ONEIDA Shoulder M.D.   On: 10/10/2023 18:29       Kirsty Monjaraz T. [facility]  If 7PM-7AM, please contact night-coverage [email or web address] 10/11/2023, 2:30 PM

## 2023-10-12 DIAGNOSIS — Z515 Encounter for palliative care: Secondary | ICD-10-CM | POA: Diagnosis not present

## 2023-10-12 DIAGNOSIS — D62 Acute posthemorrhagic anemia: Secondary | ICD-10-CM | POA: Diagnosis not present

## 2023-10-12 DIAGNOSIS — Z7189 Other specified counseling: Secondary | ICD-10-CM | POA: Diagnosis not present

## 2023-10-12 DIAGNOSIS — K921 Melena: Secondary | ICD-10-CM | POA: Diagnosis not present

## 2023-10-12 DIAGNOSIS — N179 Acute kidney failure, unspecified: Secondary | ICD-10-CM | POA: Diagnosis not present

## 2023-10-12 DIAGNOSIS — J9601 Acute respiratory failure with hypoxia: Secondary | ICD-10-CM | POA: Diagnosis not present

## 2023-10-12 DIAGNOSIS — J942 Hemothorax: Secondary | ICD-10-CM | POA: Diagnosis not present

## 2023-10-12 DIAGNOSIS — E43 Unspecified severe protein-calorie malnutrition: Secondary | ICD-10-CM | POA: Diagnosis not present

## 2023-10-12 LAB — CBC
HCT: 26.6 % — ABNORMAL LOW (ref 39.0–52.0)
Hemoglobin: 8.1 g/dL — ABNORMAL LOW (ref 13.0–17.0)
MCH: 29.8 pg (ref 26.0–34.0)
MCHC: 30.5 g/dL (ref 30.0–36.0)
MCV: 97.8 fL (ref 80.0–100.0)
Platelets: 131 10*3/uL — ABNORMAL LOW (ref 150–400)
RBC: 2.72 MIL/uL — ABNORMAL LOW (ref 4.22–5.81)
RDW: 16.6 % — ABNORMAL HIGH (ref 11.5–15.5)
WBC: 3.1 10*3/uL — ABNORMAL LOW (ref 4.0–10.5)
nRBC: 0 % (ref 0.0–0.2)

## 2023-10-12 LAB — RENAL FUNCTION PANEL
Albumin: 2.6 g/dL — ABNORMAL LOW (ref 3.5–5.0)
Anion gap: 8 (ref 5–15)
BUN: 25 mg/dL — ABNORMAL HIGH (ref 8–23)
CO2: 23 mmol/L (ref 22–32)
Calcium: 8 mg/dL — ABNORMAL LOW (ref 8.9–10.3)
Chloride: 106 mmol/L (ref 98–111)
Creatinine, Ser: 1.73 mg/dL — ABNORMAL HIGH (ref 0.61–1.24)
GFR, Estimated: 41 mL/min — ABNORMAL LOW (ref >60)
Glucose, Bld: 96 mg/dL (ref 70–99)
Phosphorus: 2.7 mg/dL (ref 2.5–4.6)
Potassium: 3.6 mmol/L (ref 3.5–5.1)
Sodium: 137 mmol/L (ref 135–145)

## 2023-10-12 LAB — MAGNESIUM: Magnesium: 1.9 mg/dL (ref 1.7–2.4)

## 2023-10-12 LAB — TSH: TSH: 8.248 u[IU]/mL — ABNORMAL HIGH (ref 0.350–4.500)

## 2023-10-12 LAB — T4, FREE: Free T4: 0.83 ng/dL (ref 0.61–1.12)

## 2023-10-12 NOTE — Progress Notes (Signed)
 Daily Progress Note   Patient Name: Joshua [name]       Date: 10/12/2023 DOB: 1950-10-21  Age: 73 y.o. MRN#: [id number] Attending Physician: Kathrin Mignon DASEN, MD Primary Care Physician: Yolande Toribio MATSU, MD Admit Date: 09/24/2023 Length of Stay: 18 days  Reason for Consultation/Follow-up: Establishing goals of care  Subjective:   CC: Patient laying in bed stating he is possessed by the devil. Following up regarding complex medical decision making.   Subjective:  Extensive review of EMR prior to seeing patient. Discussed care with RN for medical updates as well. RN noted family had described patient as completely functional prior to this hospitalization.  Patient received third dose of lytic through chest tube on 10/11/23. PCCM noted of output from chest tube.   Presented to bedside to meet with patient. No family present at time of visit. Patient laying in bed with eyes opens, spitting continuously, and fidgeting b/l fingers on legs. When inquiring why patient was here, he noted that he is possessed by the devil because he murdered his son in 2025. Then he can easily state he is in the [facility] system. RN had noted patient seemed to lack a response to pain as was noted to be digging into bloody holes on his legs with his fidgeting previously. When inquiring about pain response, patient states yes to feeling pain though does not withdraw to pain at all. He only withdraws when prompted asking why he isn't pulling away from the pain. Patient denies having any concerns at this time.   Discussed care with IDT after visit including RN, pharmacy, and hospitalist to review patient's medical care.  Pharmacy noted no recent addition/deletions to medications.  Patient unable to obtain MRI due to implant in ear for further evaluation for stroke.   Objective:   Vital Signs:  BP (!) 119/42 (BP Location: Right Arm)   Pulse (!) 51   Temp (!) 97.5 F (36.4 C) (Oral)   Resp (!) 25    Ht 5' 8 (1.727 m)   Wt 110.1 kg   SpO2 100%   BMI 36.91 kg/m   Physical Exam: General: awake, confused, chronically ill appearing Cardiovascular: bradycardia noted Respiratory: no increased work of breathing noted, not in respiratory distress, chest tube in place Neuro: awake, interactive  Psych: stating he is possessed by the devil   Imaging:  I personally reviewed recent imaging.   Assessment & Plan:   Assessment: Patient is a 73 year old M with a PMHx of PAF/flutter on Pradaxa , CAD s/p stents in 2009, DM-2, CKD-3A, OSA not on CPAP, HTN, HLD and osteoarthritis admitted on 09/23/24 for management of progressive generalized weakness, dizziness, dyspnea, melena, and edema for weeks. During hospitalization has received management for GI bleed, aspiration PNA, spontaneous hemothorax 2/2 coagulopathy s/p chest tube placement with lytic use, and delirium. Palliative medicine team consulted to assist with complex medical decision making.   Recommendations/Plan: # Complex medical decision making/goals of care:  - Patient unable to participate in complex medical decision making when seen today.   -No family at bedside at time of visit. As per prior documented discussions, wife had been hopefully for patient to return home once chest tube was removed. Discussed concerns with IDT regarding neuro and/or psychiatric components of medical illness and presentation (particularly without any response to pain).  PMT continuing to engage in conversations as able.    -  Code Status: Limited: Do not attempt resuscitation (DNR) -DNR-LIMITED -Do Not Intubate/DNI   #  Psychosocial Support:  -Wife, daughter  -Consulted chaplain  # Discharge Planning: To Be Determined  Discussed with: patient, RN, hospitalist, pharmacy   Thank you for allowing the palliative care team to participate in the care Josefa LITTIE Sink.  Tinnie Radar, DO Palliative Care Provider PMT # [phone]  If patient remains  symptomatic despite maximum doses, please call PMT at [phone] between 0700 and 1900. Outside of these hours, please call attending, as PMT does not have night coverage.  Personally spent 35 minutes in patient care including extensive chart review (labs, imaging, progress/consult notes, vital signs), medically appropraite exam, discussed with treatment team, education to patient, family, and staff, documenting clinical information, medication review and management, coordination of care, and available advanced directive documents.   *Please note that this is a verbal dictation therefore any spelling or grammatical errors are due to the [employer] system interpretation.

## 2023-10-12 NOTE — Progress Notes (Signed)
 PROGRESS NOTE  Joshua [name] FMW:[id number] DOB: 04-Feb-1951   PCP: Yolande Toribio MATSU, MD  Patient is from: Home.  Lives with family.  DOA: 09/24/2023 LOS: 18  Chief complaints Chief Complaint  Patient presents with   Weakness   Dizziness   Shortness of Breath     Brief Narrative / Interim history:  73 year old M with PMH of PAF/flutter on Pradaxa , CAD s/p stents in 2009, DM-2, CKD-3A, OSA not on CPAP, HTN, HLD and osteoarthritis presenting with progressive generalized weakness, dizziness, dyspnea, melena and edema for weeks, and admitted with symptomatic anemia in the setting of GI bleed, AKI with uremia and hyponatremia.   In ED, vitals stable. Na 128. Cr 4.45 (1.4 on 8/13).  BUN 126.  Bicarb 15.  AG 12.  Hgb 7.0.  Platelets 148.  UA with microscopic hematuria.  One units of blood ordered.  Started on IV Protonix .  GI consulted.   EGD delayed due to elevated INR. IV vitamin K  and Praxibind on 12/28 and 12/30.  Also received FFP on 12/29.  Received 2 units of PRBC so far. EGD on 1/1 with 2 gastric AVMs, 1 with intermittent oozing and treated with APC.  GI recommended p.o. Protonix  40 mg twice daily for 4 weeks followed by 40 mg daily indefinitely.   TTE without significant finding. AKI resolved.  Renal US  without acute finding.     Patient had an episode of increased oxygen requirement in the evening of 12/30.  Chest x-ray showed loculated right moderate pleural effusion.  PCCM consulted.  Thoracentesis yielded 150 cc dark bloody appearing fluid.  Follow-up CT chest with large right loculated pleural effusion concerning for clotted hemothorax.  PCCM placed bore chest tube on 1/1 but without significant output. CT chest on 1/4 suggests hemopneumothorax or empyema, extensive past several atelectasis in the right lung.  Chest tube removed and CT-guided chest tube placed on 1/6.  Treated with fibrinolytics.  Repeat CT chest on 1/12 with a small residual hemopneumothorax and improved aeration  of RLL.  Pulmonology following.  Hospital course also complicated by delirium.   Subjective: Seen and examined earlier this morning.  No major events overnight of this morning.  Patient's wife reports restless night.  Patient reports abdominal pain as usual.  No other complaints.  He is awake and alert and oriented x 4 except date but confused about situation.  Fidgeting with his fingers and frequently spitting  Objective: Vitals:   10/12/23 0400 10/12/23 0500 10/12/23 0600 10/12/23 0800  BP: (!) 119/42   (!) 149/106  Pulse: (!) 48 (!) 58 (!) 51   Resp: (!) 26 (!) 22 (!) 25 20  Temp: (!) 97.5 F (36.4 C)   98 F (36.7 C)  TempSrc: Oral   Oral  SpO2: 99% 100% 100%   Weight:      Height:        Examination:  GENERAL: No apparent distress.  Nontoxic. HEENT: MMM.  Vision and hearing grossly intact.  NECK: Supple.  No apparent JVD.  RESP: No IWOB.  Fair aeration bilaterally but limited exam. CVS: Irregular rhythm.  Bradycardic to 50s.  Heart sounds normal.  ABD/GI/GU: BS+. Abd soft, NTND.  MSK/EXT:  Moves extremities. No apparent deformity.  Trace BLE edema. NEURO: Awake and alert.  Oriented x 4 except date but confused about situation.  No apparent focal neuro deficit. PSYCH: Fidgeting and frequently spitting.  Procedures:  12/31-right thoracocentesis with removal of 150 cc dark bloody appearing fluid 1/1-right chest tube  by pulmonology 1/2-EGD with 2 gastric AVMs, 1 of which oozing and treated with APC's  Microbiology summarized: 12/27-MRSA PCR screen negative 12/31-pleural fluid culture negative. 1/6-pleural fluid culture negative.  Assessment and plan: Symptomatic ABLA due to upper GIB, right hemothorax: Patient was on Pradaxa  for A-fib/flutter. Hgb 7.0 (was 13.1 on 6/20).  Had melena for over 2 weeks.  Was symptomatic.  S/p IV vitamin K  and Praxibind on 12/28 and 12/30.  Also received FFP on 12/29.  Received 2 units of PRBC. EGD on 1/1 with 2 gastric AVMs, 1 with  intermittent oozing and treated with APC.   -GI recs: P.o. Protonix  40 mg BID x 4 weeks followed by 40 mg daily indefinitely.  -Continue to hold Pradaxa . -Will discuss with patient's cardiologist, Dr. Waddell about long-term anticoagulation once ready to resume -Monitor H&H.  Transfuse for Hgb <8.0 given history of CAD.   Right-sided hemothorax, loculated pleural effusion: Likely due to Pradaxa , INR supratherapeutic 9.5 on 12/27, reversed with FFP, Praxbind  -S/p thora with removal of 150 cc dark bloody, culture-negative exudative and neutrophilic fluid.  Cytology negative -S/p right chest tube and pleural fibrinolysis on 1/1-1/6 but no significant output.   -CT chest on 1/4 suggests hemopneumothorax or empyema, extensive past several atelectasis in the right lung. -CT-guided chest tube placement by IR on 1/6 after discussion with CVTS.  CT output 600 cc / 24 hours. -Repeat CT chest on 1/12 with small residual hemopneumothorax and improved aeration of RLL. -Continue empiric IV Zosyn , management per CCM   Acute respiratory failure with hypoxia: Resolved. -Due to OSA, atelectasis, hemothorax and nasal blockage from dry blood   Acute delirium, sundowning, generalized debility: Seems to be waxing or waning.   CT head without acute finding.    No focal neurodeficit.  -Avoid benzodiazepines or deliriogenics -Reorientation and delirium precaution -PT/OT   AKI on CKD-3A/uremia: Cr 1.4 in 04/2023.  ATN from anemia, on Benicar  at home.  Denied NSAID use.  UA with hematuria. Renal US  without significant finding.  Creatinine slightly up today. Recent Labs    09/30/23 0316 10/01/23 0308 10/02/23 0329 10/04/23 0333 10/05/23 0302 10/06/23 0538 10/07/23 0302 10/08/23 0300 10/09/23 1532 10/12/23 0258  BUN 45* 37* 41* 38* 32* 32* 31* 24* 18 25*  CREATININE 2.03* 1.75* 1.69* 1.45* 1.27* 1.49* 1.34* 1.42* 1.07 1.73*  -Recheck renal function in the morning  NIDDM-2 with CKD-3A: A1c 5.5%.  BMP glucose  within normal range.   Essential hypertension: BP within acceptable range. Coreg , Imdur  on hold due to hypotension.  Midodrine  discontinued   History of CAD s/p stent in 2009:  Currently Coreg , Imdur , losartan on hold due to soft blood pressure. -CT chest reviewed, patient has a known history of CAD s/p PCI in 2009, currently has no anginal symptoms   Paroxysmal A-fib with RVR/paroxysmal NSVT: On Coreg  and Pradaxa  at home.  Per chart review, patient was switched from warfarin to Pradaxa  in 2011.  -Discontinue scheduled metoprolol  due to bradycardia -IV metoprolol  2.5 mg every 6 hours as needed for sustained HR > 120 -Continue holding Pradaxa /anticoagulation -Will discuss with patient's cardiologist, Dr. Waddell once okay to resume anticoagulation from PCCM and GI standpoint.  -Optimize electrolytes   Epistaxis: Chronic issue likely from Pradaxa .  Resolved. -S/p  Praxibind on 12/28 and vitamin K  on 12/28 and 12/30, and FFP on 12/29   Gross hematuria: Likely due to coagulopathy.  Resolved. -Discussed with urology, Dr. Watt on 12/30, was recommended outpatient follow-up.     Thrombocytopenia: Mild  and stable. -Continue monitoring.   Non-anion gap metabolic acidosis: Resolved.   Hyponatremia: Resolved.   Hypokalemia  -Monitor replenish as appropriate.  Hyperlipidemia -Continue rosuvastatin     Elevated INR: 9.5 on 12/28>> 1.5 -S/p Praxbind , vitamin K  and FFP.   Morbid obesity:  Body mass index is 36.91 kg/m.           DVT prophylaxis:  SCDs Start: 09/24/23 1503  Code Status: DNR/DNI Family Communication: Updated patient's daughter and wife at bedside. Level of care: Stepdown Status is: Inpatient Remains inpatient appropriate because: Acute blood loss anemia, hemothorax, delirium and AKI   Final disposition: Likely home once medically stable Consultants:  Gastroenterology Pulmonology Interventional radiology  55 minutes with more than 50% spent in reviewing  records, counseling patient/family and coordinating care.   Sch Meds:  Scheduled Meds:  Chlorhexidine  Gluconate Cloth  6 each Topical Daily   lactulose   20 g Oral BID   pantoprazole  (PROTONIX ) IV  40 mg Intravenous Q12H   sodium chloride  flush  10 mL Intravenous Q12H   sodium chloride  flush  10 mL Intrapleural Q8H   sodium chloride  flush  10 mL Intrapleural Q8H   tamsulosin   0.4 mg Oral Daily   Continuous Infusions:   PRN Meds:.acetaminophen  **OR** acetaminophen , alum & mag hydroxide-simeth, hydrocortisone  cream, melatonin, metoprolol  tartrate, ondansetron  **OR** ondansetron  (ZOFRAN ) IV, mouth rinse, sodium chloride , sodium chloride  flush  Antimicrobials: Anti-infectives (From admission, onward)    Start     Dose/Rate Route Frequency Ordered Stop   10/07/23 1400  piperacillin -tazobactam (ZOSYN ) IVPB 3.375 g        3.375 g 12.5 mL/hr over 240 Minutes Intravenous Every 8 hours 10/07/23 0938 10/11/23 2359   10/07/23 0200  piperacillin -tazobactam (ZOSYN ) IVPB 3.375 g  Status:  Discontinued        3.375 g 100 mL/hr over 30 Minutes Intravenous Every 6 hours 10/06/23 2130 10/07/23 0937   10/05/23 1800  piperacillin -tazobactam (ZOSYN ) IVPB 3.375 g  Status:  Discontinued        3.375 g 12.5 mL/hr over 240 Minutes Intravenous Every 8 hours 10/05/23 1028 10/06/23 2130   10/05/23 1115  piperacillin -tazobactam (ZOSYN ) IVPB 3.375 g        3.375 g 100 mL/hr over 30 Minutes Intravenous  Once 10/05/23 1028 10/05/23 1256        I have personally reviewed the following labs and images: CBC: Recent Labs  Lab 10/06/23 0538 10/07/23 0302 10/07/23 2137 10/08/23 0300 10/09/23 1532 10/12/23 0258  WBC 5.0 4.7  --  5.7 4.6 3.1*  HGB 8.4* 7.5* 8.4* 8.1* 8.4* 8.1*  HCT 26.1* 23.8* 27.9* 25.4* 25.9* 26.6*  MCV 96.3 97.9  --  96.9 95.9 97.8  PLT 162 122*  --  121* 126* 131*   BMP &GFR Recent Labs  Lab 10/06/23 0538 10/07/23 0302 10/08/23 0300 10/09/23 1532 10/10/23 0350 10/12/23 0258   NA 136 137 139 136  --  137  K 3.6 3.4* 3.4* 3.5  --  3.6  CL 101 105 106 106  --  106  CO2 27 24 24 22   --  23  GLUCOSE 101* 97 88 86  --  96  BUN 32* 31* 24* 18  --  25*  CREATININE 1.49* 1.34* 1.42* 1.07  --  1.73*  CALCIUM  7.8* 7.7* 7.8* 8.1*  --  8.0*  MG  --   --   --  1.7 1.8 1.9  PHOS  --   --   --   --   --  2.7   Estimated Creatinine Clearance: 45.1 mL/min (A) (by C-G formula based on SCr of 1.73 mg/dL (H)). Liver & Pancreas: Recent Labs  Lab 10/06/23 1229 10/10/23 0350 10/12/23 0258  AST 25 20  --   ALT 10 10  --   ALKPHOS 36* 30*  --   BILITOT 0.7 1.4*  --   PROT 5.0* 5.8*  --   ALBUMIN  1.7* 3.1* 2.6*   No results for input(s): LIPASE, AMYLASE in the last 168 hours. Recent Labs  Lab 10/06/23 1229 10/10/23 0350  AMMONIA 44* 15   Diabetic: No results for input(s): HGBA1C in the last 72 hours. No results for input(s): GLUCAP in the last 168 hours. Cardiac Enzymes: No results for input(s): CKTOTAL, CKMB, CKMBINDEX, TROPONINI in the last 168 hours. No results for input(s): PROBNP in the last 8760 hours. Coagulation Profile: Recent Labs  Lab 10/06/23 1013  INR 1.1   Thyroid Function Tests: Recent Labs    10/12/23 0258  TSH 8.248*  FREET4 0.83   Lipid Profile: No results for input(s): CHOL, HDL, LDLCALC, TRIG, CHOLHDL, LDLDIRECT in the last 72 hours. Anemia Panel: No results for input(s): VITAMINB12, FOLATE, FERRITIN, TIBC, IRON, RETICCTPCT in the last 72 hours. Urine analysis:    Component Value Date/Time   COLORURINE YELLOW 09/24/2023 1735   APPEARANCEUR HAZY (A) 09/24/2023 1735   LABSPEC 1.012 09/24/2023 1735   PHURINE 5.0 09/24/2023 1735   GLUCOSEU NEGATIVE 09/24/2023 1735   GLUCOSEU NEGATIVE 09/09/2009 1651   HGBUR LARGE (A) 09/24/2023 1735   BILIRUBINUR NEGATIVE 09/24/2023 1735   KETONESUR NEGATIVE 09/24/2023 1735   PROTEINUR 100 (A) 09/24/2023 1735   UROBILINOGEN 0.2 09/09/2009 1651   NITRITE  NEGATIVE 09/24/2023 1735   LEUKOCYTESUR NEGATIVE 09/24/2023 1735   Sepsis Labs: Invalid input(s): PROCALCITONIN, LACTICIDVEN  Microbiology: Recent Results (from the past 240 hours)  Body fluid culture w Gram Stain     Status: None   Collection Time: 10/04/23  5:26 PM   Specimen: Chest; Body Fluid  Result Value Ref Range Status   Specimen Description   Final    CHEST Performed at [facility], [address], [place], [place] [address]    Special Requests   Final    NONE Performed at [facility], [address], [place], [place] [address]    Gram Stain   Final    RARE WBC PRESENT, PREDOMINANTLY PMN NO ORGANISMS SEEN    Culture   Final    NO GROWTH 3 DAYS Performed at [facility], [address], [place], [place] [address]    Report Status 10/08/2023 FINAL  Final    Radiology Studies: No results found.      Kapil Petropoulos T. Starlena Beil [facility]  If 7PM-7AM, please contact night-coverage [email or web address] 10/12/2023, 11:12 AM

## 2023-10-12 NOTE — Progress Notes (Signed)
 NAME:  Joshua [name], MRN:  [id number], DOB:  [birth year], LOS: 18 ADMISSION DATE:  09/24/2023, CONSULTATION DATE:  09/28/23 REFERRING MD:  Mignon Bump, MD, CHIEF COMPLAINT:  Pleural effusion   History of Present Illness:  73 year old male with AF/flutter on Pradaxa , CAD s/p stents in 2009, DM2, CKD IIIA, HTN, HLD, OA OSA not on CPAP with presented with weakness, dizziness, SOB and melena admitted to [facility] for symptomatic anemia secondary to GI bleed and AKI. Since hospitalization he has received IVF, PRBC x 2U, PPI, vitamin K , praxibind and FFP. Planned for EGD today or tomorrow.  Yesterday had increased O2 requirement to 5L associated with epistaxis. Seen by [facility] and improved to 2L via [place] after blowing out nasal blood clots. CXR demonstrated loculated right pleural effusion. PCCM consulted for evaluation for thoracentesis.  Has shortness of breath with movement. Improved with oxygen. Denies past history of pleural effusion or need for thoracentesis  Pertinent  Medical History  AF/flutter on Pradaxa , CAD s/p stents in 2009, DM2, CKD IIIA, HTN, HLD, OA OSA not on CPAP  Significant Hospital Events: Including procedures, antibiotic start and stop dates in addition to other pertinent events   S/p thoracentesis 09/28/23 with 150 cc blood fluid removed 1/1 large bore  Chest CT with complex right pleura gas and fluid collection and atelectasis of right lung. Right chest tube extending into right major fissues1/4 Chest tube  1/4 CT pulled back. Still not much output 1/6 CT removed. Spoke w/ Thoracic surg. Rec'd CT guided CT which was placed by IR. Cultures repeated  1/7 added zosyn  for right sided infiltrate and purulent sputum. CT put out total of 475 ml since placed 1/6. Still exhibiting delirium. Stopping the sinemet . Getting lasix . Starting IS/Flutter and HT NaCl nebs 1/8 Family at bedside, reports delirium is better. He is currently sleeping.  Chest tube put out 240 in past 24 hours.  1/9 Rested  well overnight, delirium appears improved. out from chest in the last 24 hours - received first dose of TPA/DNAse 1/10 1.4L out from chest tube 1/13 third dose of lytics given  Interim History / Subjective:   output from chest tube in past 24 hours Restless night but overall no acute events Continues to spit and fidget with his fingers   Objective   Blood pressure (!) 149/106, pulse (!) 51, temperature 98 F (36.7 C), temperature source Oral, resp. rate 20, height 5' 8 (1.727 m), weight 110.1 kg, SpO2 100%.        Intake/Output Summary (Last 24 hours) at 10/12/2023 1128 Last data filed at 10/12/2023 9281 Gross per 24 hour  Intake 66.99 ml  Output 1470 ml  Net -1403.01 ml   Filed Weights   10/03/23 0442 10/04/23 0500 10/05/23 0500  Weight: 112.8 kg 113.4 kg 110.1 kg   Physical Exam: General: Elderly male laying in bed in no distress HEENT: Oropharynx clear, strong voice Neuro: He is awake, answers questions, follows commands CV: Distant, regular, no murmur PULM: Clear bilaterally.  Chest tubes to suction.  No airleak GI: nondistended with positive bowel sounds Extremities: No edema Skin: No rash   CT Chest 10/10/23 Lungs/Pleura: Right basilar pigtail chest tube in place. Small hemopneumothorax has decreased in size since the prior CT, with slightly smaller complex loculated components medially along the mediastinum and posterior right upper lobe (series 3, image 46). Small residual complex fluid at the right lung base near the chest tube. Improved aeration of the right lower lobe.  No consolidation.  Resolved Hospital Problem list   N/A  Assessment & Plan:  Acute hypoxic resp failure  epistaxis (resolved) Aspiration PNA w/ R basilar ASD  hemothorax (spont 2/2 coagulopathy) Small R apical ptx  Acute on chronic renal failure CKD stage IIIB H/o CAD HTN DM type II Acute delirium    Pulm prob list  Acute hypoxic resp failure 2/2 epistaxis and  resultant aspiration, spont hemothorax (INR was 9.5) still w/ complicated right pleural space w/ on-going posterior fluid collection  -recent elevated INR. S/p thora 12/31 and large bore chest tube 1/1  -no malignant cells on cyto, removed 1/6, CT guided tube placed 1/6. wbc ct not up but now w/ infiltrate and purulent sputum on 1/7 P: -Continue tube drainage of complicated right pleural space until less than of drainage per day - Completed 3 doses of pleural lytic therapy 1/13 -Completed 7 day zosyn  course on 1/13 -Pulmonary hygiene, hypertonic nebs, mobilize as able -Continue to hold Pradaxa   Acute delirium  P: -Continue supportive care -Off Sinemet  and Seroquel  -Reorientation, correct underlying metabolic abnormalities, treat underlying conditions -Hold all narcotics and benzodiasepines   Best Practice (right click and Reselect all SmartList Selections daily)   Diet/type: Regular consistency (see orders) DVT prophylaxis not indicated in setting of bleed Pressure ulcer(s): pressure ulcer assessment deferred  GI prophylaxis: PPI Lines: N/A Foley:  N/A Code Status:  full code Last date of multidisciplinary goals of care discussion [ per primary]  Critical care time: NA    Dorn Chill, MD [facility] Office: [phone]   See Amion for personal pager PCCM on call pager [phone] until 7pm. Please call Elink 7p-7a. [phone]

## 2023-10-13 DIAGNOSIS — R4589 Other symptoms and signs involving emotional state: Secondary | ICD-10-CM | POA: Insufficient documentation

## 2023-10-13 DIAGNOSIS — Z7189 Other specified counseling: Secondary | ICD-10-CM | POA: Diagnosis not present

## 2023-10-13 DIAGNOSIS — K921 Melena: Secondary | ICD-10-CM | POA: Diagnosis not present

## 2023-10-13 DIAGNOSIS — J9601 Acute respiratory failure with hypoxia: Secondary | ICD-10-CM | POA: Diagnosis not present

## 2023-10-13 DIAGNOSIS — Z515 Encounter for palliative care: Secondary | ICD-10-CM | POA: Diagnosis not present

## 2023-10-13 DIAGNOSIS — J942 Hemothorax: Secondary | ICD-10-CM | POA: Diagnosis not present

## 2023-10-13 LAB — CBC
HCT: 27.7 % — ABNORMAL LOW (ref 39.0–52.0)
Hemoglobin: 8.5 g/dL — ABNORMAL LOW (ref 13.0–17.0)
MCH: 30.2 pg (ref 26.0–34.0)
MCHC: 30.7 g/dL (ref 30.0–36.0)
MCV: 98.6 fL (ref 80.0–100.0)
Platelets: 136 10*3/uL — ABNORMAL LOW (ref 150–400)
RBC: 2.81 MIL/uL — ABNORMAL LOW (ref 4.22–5.81)
RDW: 16.2 % — ABNORMAL HIGH (ref 11.5–15.5)
WBC: 3.3 10*3/uL — ABNORMAL LOW (ref 4.0–10.5)
nRBC: 0 % (ref 0.0–0.2)

## 2023-10-13 LAB — RENAL FUNCTION PANEL
Albumin: 2.6 g/dL — ABNORMAL LOW (ref 3.5–5.0)
Anion gap: 10 (ref 5–15)
BUN: 22 mg/dL (ref 8–23)
CO2: 22 mmol/L (ref 22–32)
Calcium: 8.3 mg/dL — ABNORMAL LOW (ref 8.9–10.3)
Chloride: 108 mmol/L (ref 98–111)
Creatinine, Ser: 1.37 mg/dL — ABNORMAL HIGH (ref 0.61–1.24)
GFR, Estimated: 54 mL/min — ABNORMAL LOW (ref >60)
Glucose, Bld: 81 mg/dL (ref 70–99)
Phosphorus: 2.8 mg/dL (ref 2.5–4.6)
Potassium: 3.6 mmol/L (ref 3.5–5.1)
Sodium: 140 mmol/L (ref 135–145)

## 2023-10-13 LAB — HEPARIN LEVEL (UNFRACTIONATED): Heparin Unfractionated: 0.34 [IU]/mL (ref 0.30–0.70)

## 2023-10-13 LAB — MAGNESIUM: Magnesium: 1.9 mg/dL (ref 1.7–2.4)

## 2023-10-13 MED ORDER — HEPARIN (PORCINE) 25000 UT/250ML-% IV SOLN
1550.0000 [IU]/h | INTRAVENOUS | Status: DC
  Administered 2023-10-13 – 2023-10-17 (×5): 1250 [IU]/h via INTRAVENOUS
  Administered 2023-10-18: 1450 [IU]/h via INTRAVENOUS
  Filled 2023-10-13 (×7): qty 250

## 2023-10-13 MED ORDER — CARVEDILOL 6.25 MG PO TABS
6.2500 mg | ORAL_TABLET | Freq: Two times a day (BID) | ORAL | Status: DC
  Administered 2023-10-14 – 2023-10-19 (×7): 6.25 mg via ORAL
  Filled 2023-10-13 (×10): qty 1

## 2023-10-13 NOTE — Progress Notes (Signed)
 PHARMACY - ANTICOAGULATION CONSULT NOTE  Pharmacy Consult for heparin  Indication: atrial fibrillation  Allergies  Allergen Reactions   Desipramine  Hives and Itching   Niacin Itching    Patient Measurements: Height: 5\' 8"  (172.7 cm) Weight: 110.1 kg (242 lb 11.6 oz) IBW/kg (Calculated) : 68.4 Heparin  Dosing Weight: 89 kg  Vital Signs: Temp: 98.3 F (36.8 C) (01/15 1200) Temp Source: Axillary (01/15 1200) BP: 170/158 (01/15 1000) Pulse Rate: 101 (01/15 1000)  Labs: Recent Labs    10/12/23 0258 10/13/23 0304  HGB 8.1* 8.5*  HCT 26.6* 27.7*  PLT 131* 136*  CREATININE 1.73* 1.37*    Estimated Creatinine Clearance: 56.9 mL/min (A) (by C-G formula based on SCr of 1.37 mg/dL (H)).   Medical History: Past Medical History:  Diagnosis Date   Arthritis    Atrial fibrillation (HCC)    Atrial flutter (HCC)    Back pain    CAD (coronary artery disease)    stents placed 2009   Complication of anesthesia    woke up during surgery and ablation   DM (diabetes mellitus) (HCC)    type 2   Elevated PSA    Gout    Hearing problem    HTN (hypertension)    Hyperlipidemia    Myocardial infarct, old 2010   Prostatitis    Renal insufficiency      Assessment: 73 year old male presented on 12/27 with melena and weakness. He has history of atrial fibrillation/flutter on Pradaxa . Found to have a GI bleed and was given vitamin K in addition to Praxbind  x 2, FFP and PRBCs. EGD revealed 2 gastric AVMs with intermittent oozing s/p APC. Initially improved but then developed worsening hypoxia and imaging showed loculated moderate right pleural effusion. He had a chest tube placed and is now s/p 3 doses of intrapleural lytics. Anticoagulation has remained on hold. Pharmacy now consulted to manage heparin  for atrial fibrillation.   Goal of Therapy:  Heparin  level 0.3-0.7 units/ml Monitor platelets by anticoagulation protocol: Yes   Plan:  -Start heparin  infusion at 1250 units/hr with no  bolus per consult -Check 8 hour heparin  level -Daily heparin  level and CBC while on heparin  infusion -Follow up transition to oral anticoagulation as appropriate   Lolita Rise, PharmD, BCPS Clinical Pharmacist 10/13/2023 1:53 PM

## 2023-10-13 NOTE — Progress Notes (Signed)
 PROGRESS NOTE  Joshua [name] GNF:[id number] DOB: 1951/02/15 DOA: 09/24/2023 PCP: Bertha Broad, MD   LOS: 19 days   Brief Narrative / Interim history: 73 year old male with history of PAF/flutter on Pradaxa , CAD status post stents 2009, DM2, CKD 3A, OSA not on CPAP, HTN, HLD comes into the hospital with weakness, dizziness, dyspnea, melena and lower extremity swelling for several weeks.  He was found to have a GI bleed, acute kidney injury with uremia and hyponatremia.  His anticoagulation was reversed, was given vitamin K, received FFP as well as 2 days of packed blood cells.  Gastroenterology was consulted and underwent an EGD on 1/1 with 2 gastric AVMs with intermittent oozing, status post APC.  GI recommended potential resumption of anticoagulation on 1/4, as well as PPI BID for 4 weeks followed by daily indefinitely.  Overall eventually improved, however had worsening hypoxia and imaging showed a loculated moderate right pleural effusion.  He had a chest tube placed which is now being managed with the assistance of pulmonology.  Subjective / 24h Interval events: He is doing well this morning, reports intermittent chest pain, shortness of breath, abdominal pain and somewhat of a pan positive review of systems.  But he tells me he is "feeling fine".  Wife is at bedside, reports that he has been having intermittent confusion since being in the ICU.  Assesement and Plan: Principal Problem:   Gastrointestinal hemorrhage with melena Active Problems:   Type 2 diabetes mellitus (HCC)   Hyperlipidemia   Essential hypertension   Coronary artery disease due to lipid rich plaque   Paroxysmal atrial fibrillation (HCC)   Pulmonary hypertension (HCC)   Symptomatic anemia   ABLA (acute blood loss anemia)   Protein-calorie malnutrition, severe (HCC)   Thrombocytosis   Hyponatremia   AKI (acute kidney injury) (HCC)   CKD stage 3a, GFR 45-59 ml/min (HCC)   Hemothorax   Pneumothorax    Hemothorax on right   Pleural effusion   Palliative care encounter   Goals of care, counseling/discussion   Counseling and coordination of care   Principal problem Right-sided hemothorax, loculated pleural effusion -pulmonary consulted and following.  Had a chest tube initially 1/1, no significant output, eventually had a CT-guided chest tube placement by IR on 1/6 after discussing case with cardiothoracic surgery.  Pulmonary following, status post lytic treatment with last dose 1/13.  Initial thoracentesis showed dark bloody stools, cultures are negative, fluid was found to have exudative, cytology was negative.  He was initially placed on Zosyn , completed a 7-day course -Management of chest tube per CCM  Active problems Symptomatic acute blood loss anemia due to upper GI bleed -patient was on Pradaxa  on admission for a flutter.  He presented with melena for over 2 weeks, symptomatic.  Status post vitamin K as well as Praxbind  on 12/28 and 12/30, also received FFP and 2 units of packed red blood cells.  GI consulted and underwent an EGD, found to have AVM's status post APC.  Clinically without further bleeding.  Per GI, Pradaxa  could have been resumed on 1/4, but being hold now due to his hemothorax  Acute hypoxic respiratory failure-wean off to room air as tolerated  ICU induced delirium, sundowning, generalized debility -seems to be waxing and waning, prior to this wife denies any memory issues, he was fully functional.  Suspect he will improve over time  Acute kidney injury on CKD 3A-baseline creatinine around 1.4, acute kidney injury likely in the setting of acute illness.  Creatinine at baseline today  DM2-A1c 5.5.  Essential hypertension-starting to be hypertensive, initially Coreg  and Imdur  were on hold due to hypotension, was briefly placed on midodrine  which has not been discontinued.  Due to elevated blood pressures, resume low-dose Coreg  today  PAF, paroxysmal SVT -resume Coreg   today as above, Pradaxa  on hold  Epistaxis-chronic issue from Pradaxa , resolved  Gross hematuria-due to coagulopathy, resolved.  Prior hospitalist discussed with Dr. Inga Manges on 12/30, outpatient follow-up  Thrombocytopenia - Mild and stable. Continue monitoring.   Non-anion gap metabolic acidosis - Resolved.   Hyponatremia - Resolved.   Hypokalemia  -Monitor replenish as appropriate.   Hyperlipidemia -Continue rosuvastatin     Elevated INR - 9.5 on 12/28, S/p Praxbind , vitamin K and FFP.   Morbid obesity  - Body mass index is 36.91 kg/m.  Scheduled Meds:  Chlorhexidine  Gluconate Cloth  6 each Topical Daily   lactulose   20 g Oral BID   pantoprazole  (PROTONIX ) IV  40 mg Intravenous Q12H   sodium chloride  flush  10 mL Intravenous Q12H   sodium chloride  flush  10 mL Intrapleural Q8H   sodium chloride  flush  10 mL Intrapleural Q8H   tamsulosin   0.4 mg Oral Daily   Continuous Infusions: PRN Meds:.acetaminophen  **OR** acetaminophen , alum & mag hydroxide-simeth, hydrocortisone  cream, melatonin, metoprolol  tartrate, ondansetron  **OR** ondansetron  (ZOFRAN ) IV, mouth rinse, sodium chloride , sodium chloride  flush  Current Outpatient Medications  Medication Instructions   carvedilol  (COREG ) 12.5 mg, Oral, 2 times daily   fenofibrate 160 mg, Oral, Daily   ipratropium (ATROVENT) 0.06 % nasal spray 1 spray, Each Nare, 3 times daily PRN   isosorbide  mononitrate (IMDUR ) 30 mg, Oral, Daily   nitroGLYCERIN  (NITROSTAT ) 0.4 mg, Sublingual, Every 5 min x3 PRN   olmesartan  (BENICAR ) 40 mg, Oral, Daily   pioglitazone  (ACTOS ) 15 mg, Oral, Daily   Pradaxa  150 mg, Oral, 2 times daily   rosuvastatin  (CRESTOR ) 20 mg, Oral, Daily   Tylenol  8 Hour Arthritis Pain 650-1,300 mg, Oral, Every 8 hours PRN   vitamin B-12 (CYANOCOBALAMIN ) 500 mcg, Oral, Daily    Diet Orders (From admission, onward)     Start     Ordered   10/05/23 0831  Diet Heart Room service appropriate? Yes; Fluid consistency: Thin  Diet  effective now       Question Answer Comment  Room service appropriate? Yes   Fluid consistency: Thin      10/05/23 0830            DVT prophylaxis: SCDs Start: 09/24/23 1503   Lab Results  Component Value Date   PLT 136 (L) 10/13/2023      Code Status: Limited: Do not attempt resuscitation (DNR) -DNR-LIMITED -Do Not Intubate/DNI   Family Communication: Wife present at bedside  Status is: Inpatient Remains inpatient appropriate because: severity of illness  Level of care: Stepdown  Consultants:  Pulmonary GI  Objective: Vitals:   10/13/23 0720 10/13/23 0800 10/13/23 0812 10/13/23 0900  BP:  (!) 155/60  (!) 169/81  Pulse:  67 60 83  Resp:  20 (!) 24 20  Temp: 98.1 F (36.7 C)     TempSrc: Oral     SpO2:  96% 96% 97%  Weight:      Height:        Intake/Output Summary (Last 24 hours) at 10/13/2023 0955 Last data filed at 10/13/2023 0839 Gross per 24 hour  Intake 350 ml  Output 1370 ml  Net -1020 ml   Wt Readings from Last  3 Encounters:  10/05/23 110.1 kg  04/27/23 115.2 kg  03/18/23 119.9 kg    Examination:  Constitutional: NAD Eyes: no scleral icterus ENMT: Mucous membranes are moist.  Neck: normal, supple Respiratory: clear to auscultation bilaterally, no wheezing, no crackles. Normal respiratory effort. No accessory muscle use.  Cardiovascular: Regular rate and rhythm, no murmurs / rubs / gallops. No LE edema.  Abdomen: non distended, no tenderness. Bowel sounds positive.  Musculoskeletal: no clubbing / cyanosis.   Data Reviewed: I have independently reviewed following labs and imaging studies   CBC Recent Labs  Lab 10/07/23 0302 10/07/23 2137 10/08/23 0300 10/09/23 1532 10/12/23 0258 10/13/23 0304  WBC 4.7  --  5.7 4.6 3.1* 3.3*  HGB 7.5* 8.4* 8.1* 8.4* 8.1* 8.5*  HCT 23.8* 27.9* 25.4* 25.9* 26.6* 27.7*  PLT 122*  --  121* 126* 131* 136*  MCV 97.9  --  96.9 95.9 97.8 98.6  MCH 30.9  --  30.9 31.1 29.8 30.2  MCHC 31.5  --  31.9  32.4 30.5 30.7  RDW 15.8*  --  15.9* 15.8* 16.6* 16.2*    Recent Labs  Lab 10/06/23 1013 10/06/23 1229 10/06/23 1757 10/07/23 0302 10/08/23 0300 10/09/23 1532 10/10/23 0350 10/12/23 0258 10/13/23 0304  NA  --   --   --  137 139 136  --  137 140  K  --   --   --  3.4* 3.4* 3.5  --  3.6 3.6  CL  --   --   --  105 106 106  --  106 108  CO2  --   --   --  24 24 22   --  23 22  GLUCOSE  --   --   --  97 88 86  --  96 81  BUN  --   --   --  31* 24* 18  --  25* 22  CREATININE  --   --   --  1.34* 1.42* 1.07  --  1.73* 1.37*  CALCIUM   --   --   --  7.7* 7.8* 8.1*  --  8.0* 8.3*  AST  --  25  --   --   --   --  20  --   --   ALT  --  10  --   --   --   --  10  --   --   ALKPHOS  --  36*  --   --   --   --  30*  --   --   BILITOT  --  0.7  --   --   --   --  1.4*  --   --   ALBUMIN   --  1.7*  --   --   --   --  3.1* 2.6* 2.6*  MG  --   --   --   --   --  1.7 1.8 1.9 1.9  CRP  --   --  4.3*  --   --   --   --   --   --   INR 1.1  --   --   --   --   --   --   --   --   TSH  --   --   --   --   --   --   --  8.248*  --   AMMONIA  --  44*  --   --   --   --  15  --   --     ------------------------------------------------------------------------------------------------------------------ No results for input(s): "CHOL", "HDL", "LDLCALC", "TRIG", "CHOLHDL", "LDLDIRECT" in the last 72 hours.  Lab Results  Component Value Date   HGBA1C 5.5 09/24/2023   ------------------------------------------------------------------------------------------------------------------ Recent Labs    10/12/23 0258  TSH 8.248*    Cardiac Enzymes No results for input(s): "CKMB", "TROPONINI", "MYOGLOBIN" in the last 168 hours.  Invalid input(s): "CK" ------------------------------------------------------------------------------------------------------------------    Component Value Date/Time   BNP 295.2 (H) 09/27/2023 1614    CBG: No results for input(s): "GLUCAP" in the last 168 hours.  Recent  Results (from the past 240 hours)  Body fluid culture w Gram Stain     Status: None   Collection Time: 10/04/23  5:26 PM   Specimen: Chest; Body Fluid  Result Value Ref Range Status   Specimen Description   Final    CHEST Performed at [facility], [address], [place], [place] [address]    Special Requests   Final    NONE Performed at [facility], [address], [place], [place] [address]    Gram Stain   Final    RARE WBC PRESENT, PREDOMINANTLY PMN NO ORGANISMS SEEN    Culture   Final    NO GROWTH 3 DAYS Performed at [facility], [address], [place], [place] [address]    Report Status 10/08/2023 FINAL  Final     Radiology Studies: No results found.   Kathlen Para, MD, PhD [facility]  Between 7 am - 7 pm I am available, please contact me via Amion (for emergencies) or Securechat (non urgent messages)  Between 7 pm - 7 am I am not available, please contact night coverage MD/APP via Amion

## 2023-10-13 NOTE — Progress Notes (Signed)
 Daily Progress Note   Patient Name: Joshua [name]       Date: 10/13/2023 DOB: 03-Nov-1950  Age: 73 y.o. MRN#: [id number] Attending Physician: Osborn Blaze, MD Primary Care Physician: Bertha Broad, MD Admit Date: 09/24/2023 Length of Stay: 19 days  Reason for Consultation/Follow-up: Establishing goals of care  Subjective:   CC: Patient laying in bed appropriately answering short questions though still spitting and fidgeting. Following up regarding complex medical decision making.   Subjective:  Reviewed EMR prior to presenting to bedside.  When presenting to bedside, patient laying in bed with eyes closed.  Patient still fidgeting with fingers on his legs and spitting at times.  Patient's wife present at bedside.  Introduced myself as a member of the palliative medicine team.  Patient will occasionally open his eyes and engage appropriately with short questions though not extensive conversation.  Spent time providing emotional support for wife.  Again reviewed patient's medical history.  Patient was functional prior to hospitalization.  Did see where patient had been seen by hematologist for concerns regarding pancytopenia though bone marrow biopsy did not show evidence and oncologist noted that this may represent growing myelodysplasia versus ICUS.  Wife denies any other acute medical abnormalities recently.  Patient never had history of fidgeting or spitting.  Wife did acknowledge patient has implants and ear so again discussed why cannot obtain MRI to determine if patient had insult to brain.  Inquired about patient's lack of pain response during examination yesterday and today.  Wife notes patient has never had issues with lack of pain response previously.  Spent time reviewing current medical plan.  Wife is still hopeful that patient will be able to have the chest tube removed and then go home with her.  Wife expresses knowing that patient will require lots of care at home.  Wife  is hopeful that he can go home as they have been married for 56 years and she does not want him going into a facility.  Acknowledged.  Spent time providing emotional support via active listening.  Noted palliative medicine team will continue following with patient's medical journey.  Objective:   Vital Signs:  BP (!) 169/81   Pulse 83   Temp 98.1 F (36.7 C) (Oral)   Resp 20   Ht 5\' 8"  (1.727 m)   Wt 110.1 kg   SpO2 97%   BMI 36.91 kg/m   Physical Exam: General: awake, chronically ill appearing Cardiovascular: tachycardia noted Respiratory: no increased work of breathing noted, not in respiratory distress, chest tube in place Neuro: awake, interactive, fidgeting with b/l hands on legs and spitting spontaneously   Imaging:  I personally reviewed recent imaging.   Assessment & Plan:   Assessment: Patient is a 74 year old M with a PMHx of PAF/flutter on Pradaxa , CAD s/p stents in 2009, DM-2, CKD-3A, OSA not on CPAP, HTN, HLD and osteoarthritis admitted on 09/23/24 for management of progressive generalized weakness, dizziness, dyspnea, melena, and edema for weeks. During hospitalization has received management for GI bleed, aspiration PNA, spontaneous hemothorax 2/2 coagulopathy s/p chest tube placement with lytic use, and delirium. Palliative medicine team consulted to assist with complex medical decision making.   Recommendations/Plan: # Complex medical decision making/goals of care:  - Patient unable to participate in complex medical decision making when seen today.   -Discussed care with wife as detailed above in HPI. While patient's agitation and spontaneous behaviors could be related to delirium, still concerned about neuro components of medical illness  and presentation (particularly without any response to pain for two days now on examination).  Based does have history of vasculature diease (CAD requiring stents) and hx of abnormal afib/flutter. Unfortunately unable to obtain  MRI for further information due to ear implant so continuing appropraite medical management at this time. PMT continuing to engage in conversations as able.    -  Code Status: Limited: Do not attempt resuscitation (DNR) -DNR-LIMITED -Do Not Intubate/DNI   # Psychosocial Support:  -Wife, daughter  -Consulted chaplain  # Discharge Planning: To Be Determined  Discussed with: patient, RN, hospitalist, pharmacy   Thank you for allowing the palliative care team to participate in the care Rosalina Colt.  Barnett Libel, DO Palliative Care Provider PMT # [phone]  If patient remains symptomatic despite maximum doses, please call PMT at [phone] between 0700 and 1900. Outside of these hours, please call attending, as PMT does not have night coverage.  Personally spent 42 minutes in patient care including extensive chart review (labs, imaging, progress/consult notes, vital signs), medically appropraite exam, discussed with treatment team, education to patient, family, and staff, documenting clinical information, medication review and management, coordination of care, and available advanced directive documents.   *Please note that this is a verbal dictation therefore any spelling or grammatical errors are due to the "[employer]agon [employer] One" system interpretation.

## 2023-10-13 NOTE — Plan of Care (Signed)
  Problem: Education: Goal: Knowledge of General Education information will improve Description: Including pain rating scale, medication(s)/side effects and non-pharmacologic comfort measures Outcome: Progressing   Problem: Clinical Measurements: Goal: Will remain free from infection Outcome: Progressing   Problem: Elimination: Goal: Will not experience complications related to urinary retention Outcome: Progressing   Problem: Pain Management: Goal: General experience of comfort will improve Outcome: Progressing   Problem: Education: Goal: Individualized Educational Video(s) Outcome: Progressing

## 2023-10-13 NOTE — Progress Notes (Signed)
PHARMACY - ANTICOAGULATION CONSULT NOTE  Pharmacy Consult for heparin Indication: atrial fibrillation  Allergies  Allergen Reactions   Desipramine Hives and Itching   Niacin Itching    Patient Measurements: Height: 5\' 8"  (172.7 cm) Weight: 110.1 kg (242 lb 11.6 oz) IBW/kg (Calculated) : 68.4 Heparin Dosing Weight: 89 kg  Vital Signs: Temp: 100.3 F (37.9 C) (01/15 2000) Temp Source: Oral (01/15 2000) BP: 154/68 (01/15 2000) Pulse Rate: 75 (01/15 2000)  Labs: Recent Labs    10/12/23 0258 10/13/23 0304 10/13/23 2250  HGB 8.1* 8.5*  --   HCT 26.6* 27.7*  --   PLT 131* 136*  --   HEPARINUNFRC  --   --  0.34  CREATININE 1.73* 1.37*  --     Estimated Creatinine Clearance: 56.9 mL/min (A) (by C-G formula based on SCr of 1.37 mg/dL (H)).   Medical History: Past Medical History:  Diagnosis Date   Arthritis    Atrial fibrillation (HCC)    Atrial flutter (HCC)    Back pain    CAD (coronary artery disease)    stents placed 2009   Complication of anesthesia    woke up during surgery and ablation   DM (diabetes mellitus) (HCC)    type 2   Elevated PSA    Gout    Hearing problem    HTN (hypertension)    Hyperlipidemia    Myocardial infarct, old 2010   Prostatitis    Renal insufficiency      Assessment: 73 year old male presented on 12/27 with melena and weakness. He has history of atrial fibrillation/flutter on Pradaxa. Found to have a GI bleed and was given vitamin K in addition to Praxbind x 2, FFP and PRBCs. EGD revealed 2 gastric AVMs with intermittent oozing s/p APC. Initially improved but then developed worsening hypoxia and imaging showed loculated moderate right pleural effusion. He had a chest tube placed and is now s/p 3 doses of intrapleural lytics. Anticoagulation has remained on hold. Pharmacy now consulted to manage heparin for atrial fibrillation.   10/13/2023: Initial heparin level 0.34 on IV heparin 1250 units/hr No bleeding or infusion related  concerns reported by RN  Goal of Therapy:  Heparin level 0.3-0.7 units/ml Monitor platelets by anticoagulation protocol: Yes   Plan:  -Continue heparin infusion at 1250 units/hr  -Check confirmatory heparin level with morning labs -Daily heparin level and CBC while on heparin infusion -Follow up transition to oral anticoagulation as appropriate  Junita Push, PharmD, BCPS Clinical Pharmacist 10/13/2023 11:53 PM

## 2023-10-13 NOTE — Progress Notes (Signed)
   10/13/23 1400  Spiritual Encounters  Type of Visit Initial  Care provided to: Pt and family  Referral source Physician  Reason for visit Routine spiritual support  OnCall Visit No  Spiritual Framework  Presenting Themes Courage hope and growth;Impactful experiences and emotions;Coping tools  Community/Connection Family;Significant other  Patient Stress Factors Health changes  Family Stress Factors Health changes  Interventions  Spiritual Care Interventions Made Compassionate presence;Established relationship of care and support;Reflective listening;Encouragement  Intervention Outcomes  Outcomes Connection to spiritual care;Awareness around self/spiritual resourses;Connection to values and goals of care;Awareness of support  Spiritual Care Plan  Spiritual Care Issues Still Outstanding No further spiritual care needs at this time (see row info)   Chaplain received a spiritual consult for support for the patient. Chaplain met with patient and his wife to see how he was progressing. Patient's wife shared with the chaplain that sometimes the patient seems like himself and sometimes he does not. She expressed that he apologized to her this morning for his behavior. Wife shared with me that she is trying to stay strong and hang in there. Chaplain told the patient that she was glad to meet with him today and he said thank you and begin to cry. Chaplain offered the patient and his wife words of hope and encouragement.

## 2023-10-13 NOTE — Progress Notes (Signed)
 NAME:  ACKLEY BRIXIUS, MRN:  [id number], DOB:  [birth year], LOS: 19 ADMISSION DATE:  09/24/2023, CONSULTATION DATE:  09/28/23 REFERRING MD:  Carry Clapper, MD, CHIEF COMPLAINT:  Pleural effusion   History of Present Illness:  73 year old male with AF/flutter on Pradaxa , CAD s/p stents in 2009, DM2, CKD IIIA, HTN, HLD, OA OSA not on CPAP with presented with weakness, dizziness, SOB and melena admitted to [facility] for symptomatic anemia secondary to GI bleed and AKI. Since hospitalization he has received IVF, PRBC x 2U, PPI, vitamin K, praxibind and FFP. Planned for EGD today or tomorrow.  Yesterday had increased O2 requirement to 5L associated with epistaxis. Seen by [facility] and improved to 2L via [place] after blowing out nasal blood clots. CXR demonstrated loculated right pleural effusion. PCCM consulted for evaluation for thoracentesis.  Has shortness of breath with movement. Improved with oxygen. Denies past history of pleural effusion or need for thoracentesis  Pertinent  Medical History  AF/flutter on Pradaxa , CAD s/p stents in 2009, DM2, CKD IIIA, HTN, HLD, OA OSA not on CPAP  Significant Hospital Events: Including procedures, antibiotic start and stop dates in addition to other pertinent events   S/p thoracentesis 09/28/23 with 150 cc blood fluid removed 1/1 large bore  Chest CT with complex right pleura gas and fluid collection and atelectasis of right lung. Right chest tube extending into right major fissues1/4 Chest tube  1/4 CT pulled back. Still not much output 1/6 CT removed. Spoke w/ Thoracic surg. Rec'd CT guided CT which was placed by IR. Cultures repeated  1/7 added zosyn  for right sided infiltrate and purulent sputum. CT put out total of 475 ml since placed 1/6. Still exhibiting delirium. Stopping the sinemet . Getting lasix . Starting IS/Flutter and HT NaCl nebs 1/8 Family at bedside, reports delirium is better. He is currently sleeping.  Chest tube put out 240 in past 24 hours.  1/9 Rested  well overnight, delirium appears improved. out from chest in the last 24 hours - received first dose of TPA/DNAse 1/10 1.4L out from chest tube 1/13 third dose of lytics given  Interim History / Subjective:   output from chest tube in past 24 hours Restless night, remains fidgety Continues to spit    Objective   Blood pressure 132/66, pulse 85, temperature 99 F (37.2 C), temperature source Oral, resp. rate (!) 21, height 5\' 8"  (1.727 m), weight 110.1 kg, SpO2 98%.        Intake/Output Summary (Last 24 hours) at 10/13/2023 0749 Last data filed at 10/13/2023 4696 Gross per 24 hour  Intake 50 ml  Output 1070 ml  Net -1020 ml   Filed Weights   10/03/23 0442 10/04/23 0500 10/05/23 0500  Weight: 112.8 kg 113.4 kg 110.1 kg   Physical Exam: General: Elderly male laying in bed in no distress HEENT: Oropharynx clear, strong voice Neuro: He is awake, answers questions, follows commands CV: Distant, regular, no murmur PULM: Clear bilaterally.  Chest tubes to suction.  No airleak GI: nondistended with positive bowel sounds Extremities: No edema Skin: No rash   Resolved Hospital Problem list   N/A  Assessment & Plan:  Acute hypoxic resp failure  epistaxis (resolved) Aspiration PNA w/ R basilar ASD  hemothorax (spont 2/2 coagulopathy) Small R apical ptx  Acute on chronic renal failure CKD stage IIIB H/o CAD HTN DM type II Acute delirium    Pulm prob list  Acute hypoxic resp failure 2/2 epistaxis and resultant aspiration, spont  hemothorax (INR was 9.5) still w/ complicated right pleural space w/ on-going posterior fluid collection   P: -Continue tube drainage of complicated right pleural space until less than of drainage per day - Completed 3 doses of pleural lytic therapy 1/13 -Completed 7 day zosyn  course on 1/13 -Pulmonary hygiene, hypertonic nebs, mobilize as able -Continue to hold Pradaxa   Acute delirium  P: -Continue supportive care -Off  Sinemet  and Seroquel  -Reorientation, correct underlying metabolic abnormalities, treat underlying conditions -Hold all narcotics and benzodiasepines   Best Practice (right click and "Reselect all SmartList Selections" daily)   Diet/type: Regular consistency (see orders) DVT prophylaxis not indicated in setting of bleed Pressure ulcer(s): pressure ulcer assessment deferred  GI prophylaxis: PPI Lines: N/A Foley:  N/A Code Status:  full code Last date of multidisciplinary goals of care discussion [ per primary]  Critical care time: NA    Duaine German, MD [facility] Office: [phone]   See Amion for personal pager PCCM on call pager [phone] until 7pm. Please call Elink 7p-7a. [phone]

## 2023-10-14 DIAGNOSIS — J942 Hemothorax: Secondary | ICD-10-CM | POA: Diagnosis not present

## 2023-10-14 DIAGNOSIS — K921 Melena: Secondary | ICD-10-CM | POA: Diagnosis not present

## 2023-10-14 DIAGNOSIS — J9601 Acute respiratory failure with hypoxia: Secondary | ICD-10-CM | POA: Diagnosis not present

## 2023-10-14 LAB — CBC
HCT: 27.7 % — ABNORMAL LOW (ref 39.0–52.0)
Hemoglobin: 8.6 g/dL — ABNORMAL LOW (ref 13.0–17.0)
MCH: 30 pg (ref 26.0–34.0)
MCHC: 31 g/dL (ref 30.0–36.0)
MCV: 96.5 fL (ref 80.0–100.0)
Platelets: 167 10*3/uL (ref 150–400)
RBC: 2.87 MIL/uL — ABNORMAL LOW (ref 4.22–5.81)
RDW: 16.5 % — ABNORMAL HIGH (ref 11.5–15.5)
WBC: 3.7 10*3/uL — ABNORMAL LOW (ref 4.0–10.5)
nRBC: 0 % (ref 0.0–0.2)

## 2023-10-14 LAB — COMPREHENSIVE METABOLIC PANEL
ALT: 10 U/L (ref 0–44)
AST: 26 U/L (ref 15–41)
Albumin: 2.8 g/dL — ABNORMAL LOW (ref 3.5–5.0)
Alkaline Phosphatase: 39 U/L (ref 38–126)
Anion gap: 10 (ref 5–15)
BUN: 20 mg/dL (ref 8–23)
CO2: 21 mmol/L — ABNORMAL LOW (ref 22–32)
Calcium: 8.5 mg/dL — ABNORMAL LOW (ref 8.9–10.3)
Chloride: 108 mmol/L (ref 98–111)
Creatinine, Ser: 1.25 mg/dL — ABNORMAL HIGH (ref 0.61–1.24)
GFR, Estimated: >60 mL/min (ref >60)
Glucose, Bld: 80 mg/dL (ref 70–99)
Potassium: 3.5 mmol/L (ref 3.5–5.1)
Sodium: 139 mmol/L (ref 135–145)
Total Bilirubin: 0.9 mg/dL (ref 0.0–1.2)
Total Protein: 5.9 g/dL — ABNORMAL LOW (ref 6.5–8.1)

## 2023-10-14 LAB — MAGNESIUM: Magnesium: 1.9 mg/dL (ref 1.7–2.4)

## 2023-10-14 LAB — HEPARIN LEVEL (UNFRACTIONATED): Heparin Unfractionated: 0.42 [IU]/mL (ref 0.30–0.70)

## 2023-10-14 LAB — PHOSPHORUS: Phosphorus: 2.5 mg/dL (ref 2.5–4.6)

## 2023-10-14 MED ORDER — HYDRALAZINE HCL 20 MG/ML IJ SOLN
10.0000 mg | INTRAMUSCULAR | Status: DC | PRN
  Administered 2023-10-14: 10 mg via INTRAVENOUS
  Filled 2023-10-14: qty 1

## 2023-10-14 NOTE — Progress Notes (Signed)
PROGRESS NOTE  Joshua [name] TDD:[id number] DOB: 10-27-1950 DOA: 09/24/2023 PCP: [name], MD   LOS: 20 days   Brief Narrative / Interim history: 73 year old male with history of PAF/flutter on Pradaxa, CAD status post stents 2009, DM2, CKD 3A, OSA not on CPAP, HTN, HLD comes into the hospital with weakness, dizziness, dyspnea, melena and lower extremity swelling for several weeks.  He was found to have a GI bleed, acute kidney injury with uremia and hyponatremia.  His anticoagulation was reversed, was given vitamin K, received FFP as well as 2 days of packed blood cells.  Gastroenterology was consulted and underwent an EGD on 1/1 with 2 gastric AVMs with intermittent oozing, status post APC.  GI recommended potential resumption of anticoagulation on 1/4, as well as PPI BID for 4 weeks followed by daily indefinitely.  Overall eventually improved, however had worsening hypoxia and imaging showed a loculated moderate right pleural effusion.  He had a chest tube placed which is now being managed with the assistance of pulmonology.  Subjective / 24h Interval events: Sleeping this morning, wakes up easily.  No specific complaints.  Wife is at bedside  Assesement and Plan: Principal Problem:   Gastrointestinal hemorrhage with melena Active Problems:   Type 2 diabetes mellitus (HCC)   Hyperlipidemia   Essential hypertension   Coronary artery disease due to lipid rich plaque   Paroxysmal atrial fibrillation (HCC)   Pulmonary hypertension (HCC)   Symptomatic anemia   ABLA (acute blood loss anemia)   Protein-calorie malnutrition, severe (HCC)   Thrombocytosis   Hyponatremia   AKI (acute kidney injury) (HCC)   CKD stage 3a, GFR 45-59 ml/min (HCC)   Hemothorax   Pneumothorax   Hemothorax on right   Pleural effusion   Palliative care encounter   Goals of care, counseling/discussion   Counseling and coordination of care   Need for emotional support   Principal  problem Right-sided hemothorax, loculated pleural effusion -pulmonary consulted and following.  Had a chest tube initially 1/1, no significant output, eventually had a CT-guided chest tube placement by IR on 1/6 after discussing case with cardiothoracic surgery.  Pulmonary following, status post lytic treatment with last dose 1/13.  Initial thoracentesis showed dark bloody stools, cultures are negative, fluid was found to have exudative, cytology was negative.  He was initially placed on Zosyn, completed a 7-day course -Management of chest tube per CCM -Carefully watch while on heparin  Active problems Symptomatic acute blood loss anemia due to upper GI bleed -patient was on Pradaxa on admission for a flutter.  He presented with melena for over 2 weeks, symptomatic.  Status post vitamin K as well as Praxbind on 12/28 and 12/30, also received FFP and 2 units of packed red blood cells.  GI consulted and underwent an EGD, found to have AVM's status post APC.  Clinically without further bleeding.  Per GI, anticoagulation could have been resumed on 1/4 -Heparin resumed 1/15 after discussing with pulmonology  Acute hypoxic respiratory failure-wean off to room air as tolerated  ICU induced delirium, sundowning, generalized debility -seems to be waxing and waning, prior to this wife denies any memory issues, he was fully functional.  Suspect he will improve over time -Stable  Acute kidney injury on CKD 3A-baseline creatinine around 1.4, acute kidney injury likely in the setting of acute illness.  Creatinine at baseline today  DM2-A1c 5.5.  Essential hypertension-starting to be hypertensive, initially Coreg and Imdur were on hold due to hypotension, was briefly  placed on midodrine which has not been discontinued.  Due to elevated blood pressures, resume low-dose Coreg today  PAF, paroxysmal SVT -resume Coreg today as above, Pradaxa on hold, started on anticoagulation 1/15 with heparin  drip  Epistaxis-chronic issue from Pradaxa, resolved  Gross hematuria-due to coagulopathy, resolved.  Prior hospitalist discussed with Dr. Annabell Howells on 12/30, outpatient follow-up.  Watch now that he is back on heparin  Thrombocytopenia - Mild and stable. Continue monitoring.  Gradually improving   Non-anion gap metabolic acidosis - Resolved.   Hyponatremia - Resolved.   Hypokalemia  -Monitor replenish as appropriate.   Hyperlipidemia -Continue rosuvastatin    Elevated INR - 9.5 on 12/28, S/p Praxbind, vitamin K and FFP.   Morbid obesity  - Body mass index is 36.91 kg/m.  Scheduled Meds:  carvedilol  6.25 mg Oral BID WC   Chlorhexidine Gluconate Cloth  6 each Topical Daily   lactulose  20 g Oral BID   pantoprazole (PROTONIX) IV  40 mg Intravenous Q12H   sodium chloride flush  10 mL Intravenous Q12H   sodium chloride flush  10 mL Intrapleural Q8H   sodium chloride flush  10 mL Intrapleural Q8H   tamsulosin  0.4 mg Oral Daily   Continuous Infusions:  heparin 1,250 Units/hr (10/14/23 0600)   PRN Meds:.acetaminophen **OR** acetaminophen, alum & mag hydroxide-simeth, hydrocortisone cream, melatonin, metoprolol tartrate, ondansetron **OR** ondansetron (ZOFRAN) IV, mouth rinse, sodium chloride, sodium chloride flush  Current Outpatient Medications  Medication Instructions   carvedilol (COREG) 12.5 mg, Oral, 2 times daily   fenofibrate 160 mg, Oral, Daily   ipratropium (ATROVENT) 0.06 % nasal spray 1 spray, Each Nare, 3 times daily PRN   isosorbide mononitrate (IMDUR) 30 mg, Oral, Daily   nitroGLYCERIN (NITROSTAT) 0.4 mg, Sublingual, Every 5 min x3 PRN   olmesartan (BENICAR) 40 mg, Oral, Daily   pioglitazone (ACTOS) 15 mg, Oral, Daily   Pradaxa 150 mg, Oral, 2 times daily   rosuvastatin (CRESTOR) 20 mg, Oral, Daily   Tylenol 8 Hour Arthritis Pain 650-1,300 mg, Oral, Every 8 hours PRN   vitamin B-12 (CYANOCOBALAMIN) 500 mcg, Oral, Daily    Diet Orders (From admission, onward)      Start     Ordered   10/05/23 0831  Diet Heart Room service appropriate? Yes; Fluid consistency: Thin  Diet effective now       Question Answer Comment  Room service appropriate? Yes   Fluid consistency: Thin      10/05/23 0830            DVT prophylaxis: SCDs Start: 09/24/23 1503   Lab Results  Component Value Date   PLT 167 10/14/2023      Code Status: Limited: Do not attempt resuscitation (DNR) -DNR-LIMITED -Do Not Intubate/DNI   Family Communication: Wife present at bedside  Status is: Inpatient Remains inpatient appropriate because: severity of illness  Level of care: Stepdown  Consultants:  Pulmonary GI  Objective: Vitals:   10/14/23 0638 10/14/23 0700 10/14/23 0800 10/14/23 0846  BP:  (!) 159/43 (!) 167/47 (!) 167/47  Pulse:  (!) 55 (!) 55 63  Resp:  (!) 21 (!) 21 (!) 24  Temp: 98 F (36.7 C)     TempSrc: Oral     SpO2:  100% 100% 95%  Weight:      Height:        Intake/Output Summary (Last 24 hours) at 10/14/2023 0914 Last data filed at 10/14/2023 2536 Gross per 24 hour  Intake 334.89  ml  Output 750 ml  Net -415.11 ml   Wt Readings from Last 3 Encounters:  10/05/23 110.1 kg  04/27/23 115.2 kg  03/18/23 119.9 kg    Examination:  Constitutional: NAD Eyes: lids and conjunctivae normal, no scleral icterus ENMT: mmm Neck: normal, supple Respiratory: clear to auscultation bilaterally, no wheezing, no crackles. Normal respiratory effort.  Cardiovascular: Regular rate and rhythm, no murmurs / rubs / gallops. No LE edema. Abdomen: soft, no distention, no tenderness. Bowel sounds positive.   Data Reviewed: I have independently reviewed following labs and imaging studies   CBC Recent Labs  Lab 10/08/23 0300 10/09/23 1532 10/12/23 0258 10/13/23 0304 10/14/23 0258  WBC 5.7 4.6 3.1* 3.3* 3.7*  HGB 8.1* 8.4* 8.1* 8.5* 8.6*  HCT 25.4* 25.9* 26.6* 27.7* 27.7*  PLT 121* 126* 131* 136* 167  MCV 96.9 95.9 97.8 98.6 96.5  MCH 30.9 31.1 29.8  30.2 30.0  MCHC 31.9 32.4 30.5 30.7 31.0  RDW 15.9* 15.8* 16.6* 16.2* 16.5*    Recent Labs  Lab 10/08/23 0300 10/09/23 1532 10/10/23 0350 10/12/23 0258 10/13/23 0304 10/14/23 0258  NA 139 136  --  137 140 139  K 3.4* 3.5  --  3.6 3.6 3.5  CL 106 106  --  106 108 108  CO2 24 22  --  23 22 21*  GLUCOSE 88 86  --  96 81 80  BUN 24* 18  --  25* 22 20  CREATININE 1.42* 1.07  --  1.73* 1.37* 1.25*  CALCIUM 7.8* 8.1*  --  8.0* 8.3* 8.5*  AST  --   --  20  --   --  26  ALT  --   --  10  --   --  10  ALKPHOS  --   --  30*  --   --  39  BILITOT  --   --  1.4*  --   --  0.9  ALBUMIN  --   --  3.1* 2.6* 2.6* 2.8*  MG  --  1.7 1.8 1.9 1.9 1.9  TSH  --   --   --  8.248*  --   --   AMMONIA  --   --  15  --   --   --     ------------------------------------------------------------------------------------------------------------------ No results for input(s): "CHOL", "HDL", "LDLCALC", "TRIG", "CHOLHDL", "LDLDIRECT" in the last 72 hours.  Lab Results  Component Value Date   HGBA1C 5.5 09/24/2023   ------------------------------------------------------------------------------------------------------------------ Recent Labs    10/12/23 0258  TSH 8.248*    Cardiac Enzymes No results for input(s): "CKMB", "TROPONINI", "MYOGLOBIN" in the last 168 hours.  Invalid input(s): "CK" ------------------------------------------------------------------------------------------------------------------    Component Value Date/Time   BNP 295.2 (H) 09/27/2023 1614    CBG: No results for input(s): "GLUCAP" in the last 168 hours.  Recent Results (from the past 240 hours)  Body fluid culture w Gram Stain     Status: None   Collection Time: 10/04/23  5:26 PM   Specimen: Chest; Body Fluid  Result Value Ref Range Status   Specimen Description   Final    CHEST Performed at [facility], [address] [place], [place] [address]    Special Requests   Final    NONE Performed  at [facility], [address], [place], [place] [address]    Gram Stain   Final    RARE WBC PRESENT, PREDOMINANTLY PMN NO ORGANISMS SEEN    Culture  Final    NO GROWTH 3 DAYS Performed at [facility], [address], [place], [place] [address]    Report Status 10/08/2023 FINAL  Final     Radiology Studies: No results found.   Pamella Pert, MD, PhD [facility]  Between 7 am - 7 pm I am available, please contact me via Amion (for emergencies) or Securechat (non urgent messages)  Between 7 pm - 7 am I am not available, please contact night coverage MD/APP via Amion

## 2023-10-14 NOTE — Plan of Care (Signed)
  Problem: Education: Goal: Knowledge of General Education information will improve Description: Including pain rating scale, medication(s)/side effects and non-pharmacologic comfort measures Outcome: Progressing   Problem: Health Behavior/Discharge Planning: Goal: Ability to manage health-related needs will improve Outcome: Progressing   Problem: Clinical Measurements: Goal: Ability to maintain clinical measurements within normal limits will improve Outcome: Progressing Goal: Will remain free from infection Outcome: Progressing Goal: Diagnostic test results will improve Outcome: Progressing Goal: Respiratory complications will improve Outcome: Progressing   Problem: Activity: Goal: Risk for activity intolerance will decrease Outcome: Progressing   Problem: Coping: Goal: Level of anxiety will decrease Outcome: Progressing   Problem: Elimination: Goal: Will not experience complications related to bowel motility Outcome: Progressing Goal: Will not experience complications related to urinary retention Outcome: Progressing   Problem: Pain Management: Goal: General experience of comfort will improve Outcome: Progressing   Problem: Safety: Goal: Ability to remain free from injury will improve Outcome: Progressing   Problem: Education: Goal: Ability to describe self-care measures that may prevent or decrease complications (Diabetes Survival Skills Education) will improve Outcome: Progressing Goal: Individualized Educational Video(s) Outcome: Progressing   Problem: Coping: Goal: Ability to adjust to condition or change in health will improve Outcome: Progressing   Problem: Fluid Volume: Goal: Ability to maintain a balanced intake and output will improve Outcome: Progressing   Problem: Health Behavior/Discharge Planning: Goal: Ability to identify and utilize available resources and services will improve Outcome: Progressing Goal: Ability to manage health-related needs  will improve Outcome: Progressing   Problem: Metabolic: Goal: Ability to maintain appropriate glucose levels will improve Outcome: Progressing   Problem: Nutritional: Goal: Progress toward achieving an optimal weight will improve Outcome: Progressing   Problem: Tissue Perfusion: Goal: Adequacy of tissue perfusion will improve Outcome: Progressing   Problem: Safety: Goal: Non-violent Restraint(s) Outcome: Progressing

## 2023-10-14 NOTE — Progress Notes (Signed)
PHARMACY - ANTICOAGULATION CONSULT NOTE  Pharmacy Consult for heparin Indication: atrial fibrillation  Allergies  Allergen Reactions   Desipramine Hives and Itching   Niacin Itching    Patient Measurements: Height: 5\' 8"  (172.7 cm) Weight: 110.1 kg (242 lb 11.6 oz) IBW/kg (Calculated) : 68.4 Heparin Dosing Weight: 89 kg  Vital Signs: Temp: 98 F (36.7 C) (01/16 0638) Temp Source: Oral (01/16 4540) BP: 159/43 (01/16 0700) Pulse Rate: 55 (01/16 0700)  Labs: Recent Labs    10/12/23 0258 10/13/23 0304 10/13/23 2250 10/14/23 0258  HGB 8.1* 8.5*  --  8.6*  HCT 26.6* 27.7*  --  27.7*  PLT 131* 136*  --  167  HEPARINUNFRC  --   --  0.34 0.42  CREATININE 1.73* 1.37*  --  1.25*    Estimated Creatinine Clearance: 62.4 mL/min (A) (by C-G formula based on SCr of 1.25 mg/dL (H)).   Medical History: Past Medical History:  Diagnosis Date   Arthritis    Atrial fibrillation (HCC)    Atrial flutter (HCC)    Back pain    CAD (coronary artery disease)    stents placed 2009   Complication of anesthesia    woke up during surgery and ablation   DM (diabetes mellitus) (HCC)    type 2   Elevated PSA    Gout    Hearing problem    HTN (hypertension)    Hyperlipidemia    Myocardial infarct, old 2010   Prostatitis    Renal insufficiency      Assessment: 73 year old male presented on 12/27 with melena and weakness. He has history of atrial fibrillation/flutter on Pradaxa. Found to have a GI bleed and was given vitamin K in addition to Praxbind x 2, FFP and PRBCs. EGD revealed 2 gastric AVMs with intermittent oozing s/p APC. Initially improved but then developed worsening hypoxia and imaging showed loculated moderate right pleural effusion. He had a chest tube placed and is now s/p 3 doses of intrapleural lytics. Anticoagulation remained on hold. On 1/15, pharmacy consulted to manage heparin for atrial fibrillation.   10/14/2023: Heparin level 0.42 on heparin infusion at 1250  units/hr CBC stable No complications of therapy noted  Goal of Therapy:  Heparin level 0.3-0.7 units/ml Monitor platelets by anticoagulation protocol: Yes   Plan:  -Continue heparin infusion at 1250 units/hr  -Daily heparin level and CBC while on heparin infusion -Follow up transition to oral anticoagulation as appropriate  Pricilla Riffle, PharmD, BCPS Clinical Pharmacist 10/14/2023 8:23 AM

## 2023-10-14 NOTE — Evaluation (Signed)
Physical Therapy Re-Evaluation Patient Details Name: Joshua [name] MRN: [id number] DOB: [birth year] Today's Date: 10/14/2023  History of Present Illness  Patient is a 73 year old male who presented with SOB, melena and dizziness. Patient was admitted with symptomatic anemia secondary to GI bleed and AKI. Patient has thoracentesis on 12/31. PMH: a fib, CKD III, HTN, HLD, OSA, OA, CPAP.Right-sided hemothorax, loculated pleural effusion - CT-guided chest tube placement by IR on 1/6 after discussing case with cardiothoracic surgery.  Pulmonary following, status post lytic treatment with last dose 1/13.  Clinical Impression  The patient is awake with eyes remaining  closed most  of the time.patient's family present providing support andd encouragement.  Patient  required mod/max support to move to sitting. Sits on bed edge with contact guard. Mod assistance of 2  to stand and  step to [facility] then back to bed.  Pt admitted with above diagnosis.  Pt currently with functional limitations due to the deficits listed below (see PT Problem List). Pt will benefit from acute skilled PT to increase their independence and safety with mobility to allow discharge.   Patient will benefit from continued inpatient follow up therapy, <3 hours/day Patient's family hopeful that patient will progress to return home.         If plan is discharge home, recommend the following: Assistance with cooking/housework;Assist for transportation;Help with stairs or ramp for entrance;Two people to help with walking and/or transfers;Two people to help with bathing/dressing/bathroom   Can travel by private vehicle   No    Equipment Recommendations None recommended by PT  Recommendations for Other Services       Functional Status Assessment Patient has had a recent decline in their functional status and demonstrates the ability to make significant improvements in function in a reasonable and predictable amount of time.      Precautions / Restrictions Precautions Precaution Comments: R chest tube, spitting-place a mask on) Restrictions Weight Bearing Restrictions Per Provider Order: No      Mobility  Bed Mobility   Bed Mobility: Rolling, Sidelying to Sit, Sit to Supine Rolling: Max assist, +2 for physical assistance, +2 for safety/equipment, Used rails Sidelying to sit: Max assist, +2 for physical assistance, +2 for safety/equipment, HOB elevated   Sit to supine: Mod assist   General bed mobility comments: Patient required hand over hand support  for all movements, to move legs and reach for rail. Patient did assist to move legs over bed edge, max assist to push to sitting, scoot  to bed edge x 2. max support to return to supine, patient assisted to lift legs onto bed    Transfers Overall transfer level: Needs assistance Equipment used: Rolling walker (2 wheels) Transfers: Bed to chair/wheelchair/BSC Sit to Stand: From elevated surface, Mod assist, +2 physical assistance, +2 safety/equipment   Step pivot transfers: Mod assist, +2 physical assistance, +2 safety/equipment       General transfer comment: assist to power up to stand from bed x 2 and from [facility] after pivot transfer  to [facility] and back to bed.    Ambulation/Gait                  Stairs            Wheelchair Mobility     Tilt Bed    Modified Rankin (Stroke Patients Only)       Balance Overall balance assessment: Needs assistance Sitting-balance support: Feet supported, No upper extremity supported, Bilateral upper extremity supported Sitting  balance-Leahy Scale: Fair     Standing balance support: During functional activity, Reliant on assistive device for balance, Bilateral upper extremity supported Standing balance-Leahy Scale: Poor Standing balance comment: steady assist  to stand at [facility]                             Pertinent Vitals/Pain Pain Assessment Pain Assessment: PAINAD Breathing:  normal Negative Vocalization: none Facial Expression: sad, frightened, frown Body Language: tense, distressed pacing, fidgeting Consolability: distracted or reassured by voice/touch PAINAD Score: 3    Home Living Family/patient expects to be discharged to:: Private residence Living Arrangements: Spouse/significant other Available Help at Discharge: Family;Available 24 hours/day Type of Home: House   Entrance Stairs-Rails: None Entrance Stairs-Number of Steps: 4   Home Layout: One level Home Equipment: Cane - single point      Prior Function Prior Level of Function : Independent/Modified Independent             Mobility Comments: walks with SPC at times, usually no AD, denies falls in past 6 months ADLs Comments: independent     Extremity/Trunk Assessment        Lower Extremity Assessment Lower Extremity Assessment: Generalized weakness    Cervical / Trunk Assessment Cervical / Trunk Assessment: Normal  Communication   Communication Communication: Difficulty communicating thoughts/reduced clarity of speech  Cognition Arousal: Alert Behavior During Therapy: Flat affect Overall Cognitive Status: Impaired/Different from baseline Area of Impairment: Attention, Safety/judgement, Problem solving, Awareness                   Current Attention Level: Focused     Safety/Judgement: Decreased awareness of deficits     General Comments: keeps  eyes closed, answers yes to all questions, delayed  FC, family present        General Comments      Exercises     Assessment/Plan    PT Assessment Patient needs continued PT services  PT Problem List Decreased activity tolerance;Cardiopulmonary status limiting activity;Decreased mobility;Decreased balance       PT Treatment Interventions DME instruction;Gait training;Functional mobility training;Therapeutic activities;Patient/family education;Therapeutic exercise;Balance training    PT Goals (Current goals  can be found in the Care Plan section)  Acute Rehab PT Goals Patient Stated Goal: to go home-per family PT Goal Formulation: With family Time For Goal Achievement: 10/28/23 Potential to Achieve Goals: Fair    Frequency Min 1X/week     Co-evaluation PT/OT/SLP Co-Evaluation/Treatment: Yes Reason for Co-Treatment: Complexity of the patient's impairments (multi-system involvement);Necessary to address cognition/behavior during functional activity;To address functional/ADL transfers PT goals addressed during session: Mobility/safety with mobility OT goals addressed during session: ADL's and self-care       AM-PAC PT "6 Clicks" Mobility  Outcome Measure Help needed turning from your back to your side while in a flat bed without using bedrails?: A Lot Help needed moving from lying on your back to sitting on the side of a flat bed without using bedrails?: A Lot Help needed moving to and from a bed to a chair (including a wheelchair)?: A Lot Help needed standing up from a chair using your arms (e.g., wheelchair or bedside chair)?: A Lot Help needed to walk in hospital room?: Total Help needed climbing 3-5 steps with a railing? : Total 6 Click Score: 10    End of Session Equipment Utilized During Treatment: Gait belt Activity Tolerance: Patient tolerated treatment well Patient left: in bed;with call bell/phone within reach;with  family/visitor present;with bed alarm set Nurse Communication: Mobility status PT Visit Diagnosis: Difficulty in walking, not elsewhere classified (R26.2)    Time: 1027-2536 PT Time Calculation (min) (ACUTE ONLY): 41 min   Charges:   PT Evaluation $PT Re-evaluation: 1 Re-eval PT Treatments $Therapeutic Activity: 8-22 mins PT General Charges $$ ACUTE PT VISIT: 1 Visit         Blanchard Kelch PT Acute Rehabilitation Services Office [phone] Weekend [phone]   Rada Hay 10/14/2023, 12:58 PM

## 2023-10-14 NOTE — Progress Notes (Signed)
NAME:  Joshua [name], MRN:  [id number], DOB:  [birth year], LOS: 20 ADMISSION DATE:  09/24/2023, CONSULTATION DATE:  09/28/23 REFERRING MD:  Candelaria Stagers, MD, CHIEF COMPLAINT:  Pleural effusion   History of Present Illness:  73 year old male with AF/flutter on Pradaxa, CAD s/p stents in 2009, DM2, CKD IIIA, HTN, HLD, OA OSA not on CPAP with presented with weakness, dizziness, SOB and melena admitted to [facility] for symptomatic anemia secondary to GI bleed and AKI. Since hospitalization he has received IVF, PRBC x 2U, PPI, vitamin K, praxibind and FFP. Planned for EGD today or tomorrow.  Yesterday had increased O2 requirement to 5L associated with epistaxis. Seen by [facility] and improved to 2L via [place] after blowing out nasal blood clots. CXR demonstrated loculated right pleural effusion. PCCM consulted for evaluation for thoracentesis.  Has shortness of breath with movement. Improved with oxygen. Denies past history of pleural effusion or need for thoracentesis  Pertinent  Medical History  AF/flutter on Pradaxa, CAD s/p stents in 2009, DM2, CKD IIIA, HTN, HLD, OA OSA not on CPAP  Significant Hospital Events: Including procedures, antibiotic start and stop dates in addition to other pertinent events   S/p thoracentesis 09/28/23 with 150 cc blood fluid removed 1/1 large bore  Chest CT with complex right pleura gas and fluid collection and atelectasis of right lung. Right chest tube extending into right major fissues1/4 Chest tube  1/4 CT pulled back. Still not much output 1/6 CT removed. Spoke w/ Thoracic surg. Rec'd CT guided CT which was placed by IR. Cultures repeated  1/7 added zosyn for right sided infiltrate and purulent sputum. CT put out total of 475 ml since placed 1/6. Still exhibiting delirium. Stopping the sinemet. Getting lasix. Starting IS/Flutter and HT NaCl nebs 1/8 Family at bedside, reports delirium is better. He is currently sleeping.  Chest tube put out 240 in past 24 hours.  1/9 Rested  well overnight, delirium appears improved. out from chest in the last 24 hours - received first dose of TPA/DNAse 1/10 1.4L out from chest tube 1/13 third dose of lytics given  Interim History / Subjective:   output from chest tube in past 24 hours Remains fidgety and continues to spit  72 hours since last dose of benzodiazepines  Objective   Blood pressure (!) 159/43, pulse (!) 55, temperature 98 F (36.7 C), temperature source Oral, resp. rate (!) 21, height 5\' 8"  (1.727 m), weight 110.1 kg, SpO2 100%.        Intake/Output Summary (Last 24 hours) at 10/14/2023 0734 Last data filed at 10/14/2023 8469 Gross per 24 hour  Intake 634.89 ml  Output 1050 ml  Net -415.11 ml   Filed Weights   10/03/23 0442 10/04/23 0500 10/05/23 0500  Weight: 112.8 kg 113.4 kg 110.1 kg   Physical Exam: General: Elderly male laying in bed in no distress HEENT: Oropharynx clear, strong voice Neuro: He is awake, answers questions, follows commands CV: Distant, regular, no murmur PULM: Clear bilaterally.  Chest tubes to suction.  No airleak GI: nondistended with positive bowel sounds Extremities: No edema Skin: No rash   Resolved Hospital Problem list   N/A  Assessment & Plan:  Acute hypoxic resp failure  epistaxis (resolved) Aspiration PNA w/ R basilar ASD  hemothorax (spont 2/2 coagulopathy) Small R apical ptx  Acute on chronic renal failure CKD stage IIIB H/o CAD HTN DM type II Acute delirium    Pulm prob list  Acute hypoxic resp  failure 2/2 epistaxis and resultant aspiration, spont hemothorax (INR was 9.5) still w/ complicated right pleural space w/ on-going posterior fluid collection   P: -Continue tube drainage of complicated right pleural space until less than of drainage per day - Completed 3 doses of pleural lytic therapy 1/13 -Completed 7 day zosyn course on 1/13 -Pulmonary hygiene, hypertonic nebs, mobilize as able -ok to resume anticoagulation  Acute  delirium  P: -Continue supportive care -Off Sinemet and Seroquel -Reorientation, correct underlying metabolic abnormalities, treat underlying conditions -Hold all narcotics and benzodiasepines   Best Practice (right click and "Reselect all SmartList Selections" daily)   Diet/type: Regular consistency (see orders) DVT prophylaxis not indicated in setting of bleed Pressure ulcer(s): pressure ulcer assessment deferred  GI prophylaxis: PPI Lines: N/A Foley:  N/A Code Status:  full code Last date of multidisciplinary goals of care discussion [ per primary]  Critical care time: NA    Melody Comas, MD [facility] Office: [phone]   See Amion for personal pager PCCM on call pager [phone] until 7pm. Please call Elink 7p-7a. [phone]

## 2023-10-14 NOTE — Evaluation (Signed)
Occupational Therapy Evaluation Patient Details Name: Joshua [name] MRN: [id number] DOB: 1951-08-14 Today's Date: 10/14/2023   History of Present Illness Patient is a 73 year old male who presented with SOB, melena and dizziness. Patient was admitted with symptomatic anemia secondary to GI bleed and AKI. Patient has thoracentesis on 12/31. PMH: a fib, CKD III, HTN, HLD, OSA, OA, CPAP.Right-sided hemothorax, loculated pleural effusion - CT-guided chest tube placement by IR on 1/6 after discussing case with cardiothoracic surgery.  Pulmonary following, status post lytic treatment with last dose 1/13. Pt was discharged from therapy services on 10/07/23 per MD request due to medical decline. New therapy orders received due to change in pt's status.   Clinical Impression   Pt admitted with the above diagnosis. Pt currently with functional limitations due to the deficits listed below (see OT Problem List). Patient will benefit from continued inpatient follow up therapy, <3 hours/day. Pt will benefit from acute skilled OT to increase their safety and independence with ADL and functional mobility for ADL to facilitate discharge. OT will continue to follow patient acutely.         If plan is discharge home, recommend the following:  (Requires 2 person 24/7 assist at this time.)    Functional Status Assessment  Patient has had a recent decline in their functional status and demonstrates the ability to make significant improvements in function in a reasonable and predictable amount of time.  Equipment Recommendations  Other (comment) (defer to next venue of care)       Precautions / Restrictions Precautions Precautions: Other (comment);Fall Precaution Comments: R chest tube, continous spitting-place a mask on) Restrictions Weight Bearing Restrictions Per Provider Order: No      Mobility Bed Mobility Overal bed mobility: Needs Assistance Bed Mobility: Rolling, Sidelying to Sit, Sit to  Supine Rolling: Max assist, +2 for physical assistance, +2 for safety/equipment, Used rails Sidelying to sit: Max assist, +2 for physical assistance, +2 for safety/equipment, HOB elevated   Sit to supine: Mod assist, +2 for physical assistance, +2 for safety/equipment   General bed mobility comments: Patient required hand over hand support  for all movements, to move legs and reach for rail. patient did assist to move legs over bed edge, max assist to push to sitting, scoot  to bed edge x 2. max support to return to supine, patient assisted to lift legs onto bed    Transfers Overall transfer level: Needs assistance Equipment used: Rolling walker (2 wheels) Transfers: Bed to chair/wheelchair/BSC Sit to Stand: From elevated surface, Mod assist, +2 physical assistance, +2 safety/equipment     Step pivot transfers: Mod assist, +2 physical assistance, +2 safety/equipment     General transfer comment: assist to power up to stand from bed x 2 and from [facility] after pivot transfer  to [facility] and back to bed.      Balance Overall balance assessment: Needs assistance Sitting-balance support: Feet supported, No upper extremity supported, Bilateral upper extremity supported Sitting balance-Leahy Scale: Fair     Standing balance support: During functional activity, Reliant on assistive device for balance, Bilateral upper extremity supported Standing balance-Leahy Scale: Poor Standing balance comment: steady assist  to stand at RW      ADL either performed or assessed with clinical judgement   ADL Overall ADL's : Needs assistance/impaired     Grooming: Total assistance;Bed level   Upper Body Bathing: Total assistance;Sitting   Lower Body Bathing: Total assistance;+2 for physical assistance;Bed level   Upper Body Dressing :  Total assistance;Bed level   Lower Body Dressing: Total assistance;+2 for physical assistance;Bed level   Toilet Transfer: Maximal assistance;+2 for physical  assistance;+2 for safety/equipment;BSC/3in1;Rolling walker (2 wheels);Cueing for safety;Cueing for sequencing   Toileting- Clothing Manipulation and Hygiene: Total assistance;+2 for physical assistance;+2 for safety/equipment;Sit to/from stand                     Perception Perception: Impaired Preception Impairment Details: Spatial orientation     Praxis Praxis: Impaired Praxis Impairment Details: Motor planning     Pertinent Vitals/Pain Pain Assessment Pain Assessment: PAINAD Breathing: normal Negative Vocalization: none Facial Expression: sad, frightened, frown Body Language: tense, distressed pacing, fidgeting Consolability: distracted or reassured by voice/touch PAINAD Score: 3     Extremity/Trunk Assessment Upper Extremity Assessment Upper Extremity Assessment: Generalized weakness   Lower Extremity Assessment Lower Extremity Assessment: Defer to PT evaluation   Cervical / Trunk Assessment Cervical / Trunk Assessment: Kyphotic (pt with forward head and rounded shoulders. Able to briefly bring head to neutral although due to muscle weakness, immediately returned to severe neck flexion.)   Communication Communication Communication: Difficulty communicating thoughts/reduced clarity of speech   Cognition Arousal: Alert (eyes remained closed during session unless cued to open) Behavior During Therapy: Flat affect, Impulsive Overall Cognitive Status: Impaired/Different from baseline Area of Impairment: Attention, Safety/judgement, Problem solving, Awareness, Following commands, Orientation, Memory    Orientation Level: Person Current Attention Level: Focused Memory: Decreased short-term memory, Decreased recall of precautions Following Commands: Follows one step commands with increased time Safety/Judgement: Decreased awareness of deficits Awareness: Intellectual Problem Solving: Slow processing, Difficulty sequencing, Requires verbal cues, Requires tactile  cues General Comments: keeps  eyes closed, answers yes to all questions, delayed  FC, family present     General Comments  chest tube right side intact, SpO2 remained stable at 97% on RA            Home Living Family/patient expects to be discharged to:: Private residence Living Arrangements: Spouse/significant other Available Help at Discharge: Family;Available 24 hours/day Type of Home: House Home Access: Stairs to enter [employer] of Steps: 4 Entrance Stairs-Rails: None Home Layout: One level     Bathroom Shower/Tub: Producer, television/film/video: Handicapped height     Home Equipment: Cane - single point          Prior Functioning/Environment Prior Level of Function : Independent/Modified Independent    Mobility Comments: walks with SPC at times, usually no AD, denies falls in past 6 months ADLs Comments: independent        OT Problem List: Decreased activity tolerance;Impaired balance (sitting and/or standing);Decreased coordination;Decreased safety awareness;Decreased knowledge of precautions;Decreased knowledge of use of DME or AE;Decreased cognition;Decreased strength      OT Treatment/Interventions: Self-care/ADL training;Therapeutic exercise;Energy conservation;DME and/or AE instruction;Therapeutic activities;Cognitive remediation/compensation;Patient/family education;Balance training    OT Goals(Current goals can be found in the care plan section) Acute Rehab OT Goals Patient Stated Goal: to use [facility] OT Goal Formulation: Patient unable to participate in goal setting Time For Goal Achievement: 10/28/23 Potential to Achieve Goals: Fair  OT Frequency: Min 1X/week    Co-evaluation PT/OT/SLP Co-Evaluation/Treatment: Yes Reason for Co-Treatment: Complexity of the patient's impairments (multi-system involvement);Necessary to address cognition/behavior during functional activity;To address functional/ADL transfers PT goals addressed during  session: Mobility/safety with mobility OT goals addressed during session: ADL's and self-care      AM-PAC OT "6 Clicks" Daily Activity     Outcome Measure Help from another person eating meals?:  Total Help from another person taking care of personal grooming?: Total Help from another person toileting, which includes using toliet, bedpan, or urinal?: Total Help from another person bathing (including washing, rinsing, drying)?: Total Help from another person to put on and taking off regular upper body clothing?: Total Help from another person to put on and taking off regular lower body clothing?: Total 6 Click Score: 6   End of Session Equipment Utilized During Treatment: Rolling walker (2 wheels) Nurse Communication: Mobility status  Activity Tolerance: Patient limited by fatigue Patient left: in bed;with call bell/phone within reach;with bed alarm set;with family/visitor present  OT Visit Diagnosis: Unsteadiness on feet (R26.81);Muscle weakness (generalized) (M62.81)                Time: 1610-9604 OT Time Calculation (min): 33 min Charges:  OT General Charges $OT Visit: 1 Visit OT Evaluation $OT Eval High Complexity: 1 High  [employer], OTR/L,CBIS  Supplemental OT - [facility] and [facility] Secure Chat Preferred    Namon Villarin, Charisse March 10/14/2023, 2:42 PM

## 2023-10-15 ENCOUNTER — Inpatient Hospital Stay (HOSPITAL_COMMUNITY): Payer: Medicare HMO

## 2023-10-15 DIAGNOSIS — R1310 Dysphagia, unspecified: Secondary | ICD-10-CM

## 2023-10-15 DIAGNOSIS — J942 Hemothorax: Secondary | ICD-10-CM | POA: Diagnosis not present

## 2023-10-15 DIAGNOSIS — K922 Gastrointestinal hemorrhage, unspecified: Principal | ICD-10-CM

## 2023-10-15 DIAGNOSIS — J9601 Acute respiratory failure with hypoxia: Secondary | ICD-10-CM | POA: Diagnosis not present

## 2023-10-15 DIAGNOSIS — K921 Melena: Secondary | ICD-10-CM | POA: Diagnosis not present

## 2023-10-15 LAB — BASIC METABOLIC PANEL
Anion gap: 13 (ref 5–15)
BUN: 16 mg/dL (ref 8–23)
CO2: 19 mmol/L — ABNORMAL LOW (ref 22–32)
Calcium: 8.6 mg/dL — ABNORMAL LOW (ref 8.9–10.3)
Chloride: 109 mmol/L (ref 98–111)
Creatinine, Ser: 1.21 mg/dL (ref 0.61–1.24)
GFR, Estimated: >60 mL/min (ref >60)
Glucose, Bld: 73 mg/dL (ref 70–99)
Potassium: 3.5 mmol/L (ref 3.5–5.1)
Sodium: 141 mmol/L (ref 135–145)

## 2023-10-15 LAB — CBC
HCT: 28.2 % — ABNORMAL LOW (ref 39.0–52.0)
Hemoglobin: 8.6 g/dL — ABNORMAL LOW (ref 13.0–17.0)
MCH: 30.1 pg (ref 26.0–34.0)
MCHC: 30.5 g/dL (ref 30.0–36.0)
MCV: 98.6 fL (ref 80.0–100.0)
Platelets: 168 10*3/uL (ref 150–400)
RBC: 2.86 MIL/uL — ABNORMAL LOW (ref 4.22–5.81)
RDW: 16.7 % — ABNORMAL HIGH (ref 11.5–15.5)
WBC: 4.1 10*3/uL (ref 4.0–10.5)
nRBC: 0 % (ref 0.0–0.2)

## 2023-10-15 LAB — HEPARIN LEVEL (UNFRACTIONATED): Heparin Unfractionated: 0.43 [IU]/mL (ref 0.30–0.70)

## 2023-10-15 MED ORDER — HYDROXYZINE HCL 10 MG PO TABS
10.0000 mg | ORAL_TABLET | Freq: Three times a day (TID) | ORAL | Status: DC | PRN
  Administered 2023-10-15 – 2023-10-20 (×3): 10 mg via ORAL
  Filled 2023-10-15 (×3): qty 1

## 2023-10-15 MED ORDER — LIDOCAINE VISCOUS HCL 2 % MT SOLN
15.0000 mL | OROMUCOSAL | Status: DC | PRN
  Filled 2023-10-15: qty 15

## 2023-10-15 MED ORDER — SUCRALFATE 1 GM/10ML PO SUSP
1.0000 g | Freq: Three times a day (TID) | ORAL | Status: DC
Start: 2023-10-15 — End: 2023-10-18
  Administered 2023-10-15 – 2023-10-18 (×5): 1 g via ORAL
  Filled 2023-10-15 (×9): qty 10

## 2023-10-15 NOTE — Progress Notes (Signed)
PROGRESS NOTE  Joshua [name] WUJ:[id number] DOB: August 07, 1951 DOA: 09/24/2023 PCP: [name], MD   LOS: 21 days   Brief Narrative / Interim history: 73 year old male with history of PAF/flutter on Pradaxa, CAD status post stents 2009, DM2, CKD 3A, OSA not on CPAP, HTN, HLD comes into the hospital with weakness, dizziness, dyspnea, melena and lower extremity swelling for several weeks.  He was found to have a GI bleed, acute kidney injury with uremia and hyponatremia.  His anticoagulation was reversed, was given vitamin K, received FFP as well as 2 days of packed blood cells.  Gastroenterology was consulted and underwent an EGD on 1/1 with 2 gastric AVMs with intermittent oozing, status post APC.  GI recommended potential resumption of anticoagulation on 1/4, as well as PPI BID for 4 weeks followed by daily indefinitely.  Overall eventually improved, however had worsening hypoxia and imaging showed a loculated moderate right pleural effusion.  He had a chest tube placed which is now being managed with the assistance of pulmonology.  Subjective / 24h Interval events: Doing well this morning, denies any shortness of breath, wife is at bedside.  No significant pain at the chest tube site.  Assesement and Plan: Principal Problem:   Gastrointestinal hemorrhage with melena Active Problems:   Type 2 diabetes mellitus (HCC)   Hyperlipidemia   Essential hypertension   Coronary artery disease due to lipid rich plaque   Paroxysmal atrial fibrillation (HCC)   Pulmonary hypertension (HCC)   Symptomatic anemia   ABLA (acute blood loss anemia)   Protein-calorie malnutrition, severe (HCC)   Thrombocytosis   Hyponatremia   AKI (acute kidney injury) (HCC)   CKD stage 3a, GFR 45-59 ml/min (HCC)   Hemothorax   Pneumothorax   Hemothorax on right   Pleural effusion   Palliative care encounter   Goals of care, counseling/discussion   Counseling and coordination of care   Need for emotional  support   Principal problem Right-sided hemothorax, loculated pleural effusion -pulmonary consulted and following.  Had a chest tube initially 1/1, no significant output, eventually had a CT-guided chest tube placement by IR on 1/6 after discussing case with cardiothoracic surgery.  Pulmonary following, status post lytic treatment with last dose 1/13.  Initial thoracentesis showed dark bloody stools, cultures are negative, fluid was found to have exudative, cytology was negative.  He was initially placed on Zosyn, completed a 7-day course -Management of chest tube per CCM, hopefully it can be removed in the next 1 to 2 days -Carefully watch while on heparin  Active problems Symptomatic acute blood loss anemia due to upper GI bleed -patient was on Pradaxa on admission for a flutter.  He presented with melena for over 2 weeks, symptomatic.  Status post vitamin K as well as Praxbind on 12/28 and 12/30, also received FFP and 2 units of packed red blood cells.  GI consulted and underwent an EGD, found to have AVM's status post APC.  Clinically without further bleeding.  Per GI, anticoagulation could have been resumed on 1/4 -Heparin resumed 1/15 after discussing with pulmonology, hemoglobin has remained stable, no clinical evidence of further bleed  Acute hypoxic respiratory failure-wean off to room air as tolerated  ICU induced delirium, sundowning, generalized debility -seems to be waxing and waning, prior to this wife denies any memory issues, he was fully functional.  Suspect he will improve over time -Overall stable, mild intermittent delirium  Acute kidney injury on CKD 3A-baseline creatinine around 1.4, acute kidney injury  likely in the setting of acute illness.  Creatinine at baseline today  DM2-A1c 5.5.  Essential hypertension-starting to be hypertensive, initially Coreg and Imdur were on hold due to hypotension, was briefly placed on midodrine which has now been discontinued.  Continue  Coreg, resumed yesterday, still hypertensive, increase to home dose today  PAF, paroxysmal SVT -continue Coreg today as above, Pradaxa on hold, started on anticoagulation 1/15 with heparin drip  Epistaxis-chronic issue from Pradaxa, resolved  Gross hematuria-due to coagulopathy, resolved.  Prior hospitalist discussed with Dr. Annabell Howells on 12/30, outpatient follow-up.  Watch now that he is back on heparin  Thrombocytopenia - Mild and stable. Continue monitoring.  Gradually improving   Non-anion gap metabolic acidosis - Resolved.   Hyponatremia - Resolved.   Hypokalemia  -Monitor replenish as appropriate.   Hyperlipidemia -Continue rosuvastatin    Elevated INR - 9.5 on 12/28, S/p Praxbind, vitamin K and FFP.   Morbid obesity  - Body mass index is 36.91 kg/m.  Scheduled Meds:  carvedilol  6.25 mg Oral BID WC   Chlorhexidine Gluconate Cloth  6 each Topical Daily   lactulose  20 g Oral BID   pantoprazole (PROTONIX) IV  40 mg Intravenous Q12H   sodium chloride flush  10 mL Intravenous Q12H   sodium chloride flush  10 mL Intrapleural Q8H   sodium chloride flush  10 mL Intrapleural Q8H   tamsulosin  0.4 mg Oral Daily   Continuous Infusions:  heparin 1,250 Units/hr (10/15/23 0727)   PRN Meds:.acetaminophen **OR** acetaminophen, alum & mag hydroxide-simeth, hydrALAZINE, hydrocortisone cream, melatonin, metoprolol tartrate, ondansetron **OR** ondansetron (ZOFRAN) IV, mouth rinse, sodium chloride, sodium chloride flush  Current Outpatient Medications  Medication Instructions   carvedilol (COREG) 12.5 mg, Oral, 2 times daily   fenofibrate 160 mg, Oral, Daily   ipratropium (ATROVENT) 0.06 % nasal spray 1 spray, Each Nare, 3 times daily PRN   isosorbide mononitrate (IMDUR) 30 mg, Oral, Daily   nitroGLYCERIN (NITROSTAT) 0.4 mg, Sublingual, Every 5 min x3 PRN   olmesartan (BENICAR) 40 mg, Oral, Daily   pioglitazone (ACTOS) 15 mg, Oral, Daily   Pradaxa 150 mg, Oral, 2 times daily    rosuvastatin (CRESTOR) 20 mg, Oral, Daily   Tylenol 8 Hour Arthritis Pain 650-1,300 mg, Oral, Every 8 hours PRN   vitamin B-12 (CYANOCOBALAMIN) 500 mcg, Oral, Daily    Diet Orders (From admission, onward)     Start     Ordered   10/05/23 0831  Diet Heart Room service appropriate? Yes; Fluid consistency: Thin  Diet effective now       Question Answer Comment  Room service appropriate? Yes   Fluid consistency: Thin      10/05/23 0830            DVT prophylaxis: SCDs Start: 09/24/23 1503   Lab Results  Component Value Date   PLT 168 10/15/2023      Code Status: Limited: Do not attempt resuscitation (DNR) -DNR-LIMITED -Do Not Intubate/DNI   Family Communication: Wife present at bedside  Status is: Inpatient Remains inpatient appropriate because: severity of illness  Level of care: Stepdown  Consultants:  Pulmonary GI  Objective: Vitals:   10/15/23 0200 10/15/23 0403 10/15/23 0600 10/15/23 0800  BP: (!) 151/72 (!) 166/50 (!) 161/49 (!) 150/54  Pulse: 69 (!) 58 63 65  Resp: (!) 27 18 (!) 27 (!) 25  Temp:    98.3 F (36.8 C)  TempSrc:    Oral  SpO2: 93% 95% 96% 96%  Weight:      Height:        Intake/Output Summary (Last 24 hours) at 10/15/2023 1057 Last data filed at 10/15/2023 0727 Gross per 24 hour  Intake 132.61 ml  Output 1060 ml  Net -927.39 ml   Wt Readings from Last 3 Encounters:  10/05/23 110.1 kg  04/27/23 115.2 kg  03/18/23 119.9 kg    Examination:  Constitutional: NAD Eyes: lids and conjunctivae normal, no scleral icterus ENMT: mmm Neck: normal, supple Respiratory: clear to auscultation bilaterally, no wheezing, no crackles. Normal respiratory effort.  Cardiovascular: Regular rate and rhythm, no murmurs / rubs / gallops. No LE edema. Abdomen: soft, no distention, no tenderness. Bowel sounds positive.    Data Reviewed: I have independently reviewed following labs and imaging studies   CBC Recent Labs  Lab 10/09/23 1532  10/12/23 0258 10/13/23 0304 10/14/23 0258 10/15/23 0333  WBC 4.6 3.1* 3.3* 3.7* 4.1  HGB 8.4* 8.1* 8.5* 8.6* 8.6*  HCT 25.9* 26.6* 27.7* 27.7* 28.2*  PLT 126* 131* 136* 167 168  MCV 95.9 97.8 98.6 96.5 98.6  MCH 31.1 29.8 30.2 30.0 30.1  MCHC 32.4 30.5 30.7 31.0 30.5  RDW 15.8* 16.6* 16.2* 16.5* 16.7*    Recent Labs  Lab 10/09/23 1532 10/10/23 0350 10/12/23 0258 10/13/23 0304 10/14/23 0258 10/15/23 0333  NA 136  --  137 140 139 141  K 3.5  --  3.6 3.6 3.5 3.5  CL 106  --  106 108 108 109  CO2 22  --  23 22 21* 19*  GLUCOSE 86  --  96 81 80 73  BUN 18  --  25* 22 20 16   CREATININE 1.07  --  1.73* 1.37* 1.25* 1.21  CALCIUM 8.1*  --  8.0* 8.3* 8.5* 8.6*  AST  --  20  --   --  26  --   ALT  --  10  --   --  10  --   ALKPHOS  --  30*  --   --  39  --   BILITOT  --  1.4*  --   --  0.9  --   ALBUMIN  --  3.1* 2.6* 2.6* 2.8*  --   MG 1.7 1.8 1.9 1.9 1.9  --   TSH  --   --  8.248*  --   --   --   AMMONIA  --  15  --   --   --   --     ------------------------------------------------------------------------------------------------------------------ No results for input(s): "CHOL", "HDL", "LDLCALC", "TRIG", "CHOLHDL", "LDLDIRECT" in the last 72 hours.  Lab Results  Component Value Date   HGBA1C 5.5 09/24/2023   ------------------------------------------------------------------------------------------------------------------ No results for input(s): "TSH", "T4TOTAL", "T3FREE", "THYROIDAB" in the last 72 hours.  Invalid input(s): "FREET3"   Cardiac Enzymes No results for input(s): "CKMB", "TROPONINI", "MYOGLOBIN" in the last 168 hours.  Invalid input(s): "CK" ------------------------------------------------------------------------------------------------------------------    Component Value Date/Time   BNP 295.2 (H) 09/27/2023 1614    CBG: No results for input(s): "GLUCAP" in the last 168 hours.  No results found for this or any previous visit (from the past 240  hours).    Radiology Studies: No results found.   Pamella Pert, MD, PhD [facility]  Between 7 am - 7 pm I am available, please contact me via Amion (for emergencies) or Securechat (non urgent messages)  Between 7 pm - 7 am I am not available, please contact night coverage MD/APP via Amion

## 2023-10-15 NOTE — Progress Notes (Signed)
Daily Progress Note   Patient Name: Joshua [name]       Date: 10/15/2023 DOB: April 17, 1951  Age: 73 y.o. MRN#: [id number] Attending Physician: Leatha Gilding, MD Primary Care Physician: [name], MD Admit Date: 09/24/2023 Length of Stay: 21 days  Reason for Consultation/Follow-up: Establishing goals of care  Subjective:   CC: Patient laying in bed with eyes closed though will appropriately answer short questions at times.  Following up regarding complex medical decision making.   Subjective:  Reviewed EMR prior to presenting to bedside.  Presented to bedside in afternoon to meet with patient and family.  Patient's wife and daughter present at bedside.  Again introduced myself as a member of the palliative medicine team.  Spent time learning more about patient's medical history and changes since hospitalization.  Spent time discussing concerns related to delirium diagnosis with family.  While this is a possibility, unsure if other factors influencing this.  Patient has not received any benzodiazepines in the past 72 hours.  Patient's family notes that he has been like this since he had chest tube inserted at hospital admission and has only had 1 day of clarity since that.  Patient was not rate regularly receiving benzodiazepines.  Tried to explore reason patient does not want to eat.  Patient only stating his abdominal pain though cannot specify where.  Patient did allow this provider to look into his mouth without spitting.  Did not see any signs of candidiasis upon inspection.  Patient does state that it can be "painful" for him to swallow.  Patient has been still spitting out pills.  Discussed with family trying scheduled Carafate and as needed lidocaine viscous solution to determine if that would help with patient's ability to swallow and continued spitting.  Family willing to try this as patient has essentially been doing this since he came into the hospital.  Spent time  answering questions as able.  Noted palliative medicine team will continue to follow with patient's medical journey.  Objective:   Vital Signs:  BP (!) 161/49   Pulse 63   Temp 98.3 F (36.8 C) (Oral)   Resp (!) 27   Ht 5\' 8"  (1.727 m)   Wt 110.1 kg   SpO2 96%   BMI 36.91 kg/m   Physical Exam: General: Awake with eyes closed, chronically ill appearing Cardiovascular: tachycardia noted Respiratory: no increased work of breathing noted, not in respiratory distress, chest tube in place Neuro: awake with eyes closed, requiring mittens for agitation  Imaging:  I personally reviewed recent imaging.   Assessment & Plan:   Assessment: Patient is a 73 year old M with a PMHx of PAF/flutter on Pradaxa, CAD s/p stents in 2009, DM-2, CKD-3A, OSA not on CPAP, HTN, HLD and osteoarthritis admitted on 09/23/24 for management of progressive generalized weakness, dizziness, dyspnea, melena, and edema for weeks. During hospitalization has received management for GI bleed, aspiration PNA, spontaneous hemothorax 2/2 coagulopathy s/p chest tube placement with lytic use, and delirium. Palliative medicine team consulted to assist with complex medical decision making.   Recommendations/Plan: # Complex medical decision making/goals of care:  - Patient unable to participate in complex medical decision making when seen today.   -Discussed care with wife and daughter as detailed above in HPI. While patient's agitation and spontaneous behaviors could be related to delirium, still concerned about neuro components of medical illness and presentation (particularly without any response to pain during examination). Questions if stroke did occur with history of of vasculature  diease (CAD requiring stents) and hx of abnormal afib/flutter. Unfortunately unable to obtain MRI for further information due to ear implant so continuing appropraite medical management at this time. PMT continuing to engage in conversations as  able.    -  Code Status: Limited: Do not attempt resuscitation (DNR) -DNR-LIMITED -Do Not Intubate/DNI   # Symptom management  -Odynophagia No plaques seen in mouth on examination today.   -Start Carafate 1 g scheduled 3 times daily   -Start oral viscous lidocaine as needed   -Consider to proactively treat with fluconazole if appropriate, would need to weigh risk of medication versus benefits  Attempted to determine if addition of these medications will allow patient to be more involved with taking oral medications and food without spitting.  # Psychosocial Support:  -Wife, daughter  -Consulted chaplain  # Discharge Planning: To Be Determined  Discussed with: patient, RN, hospitalist, pharmacy   Thank you for allowing the palliative care team to participate in the care Lucio Edward.  Alvester Morin, DO Palliative Care Provider PMT # [phone]  If patient remains symptomatic despite maximum doses, please call PMT at [phone] between 0700 and 1900. Outside of these hours, please call attending, as PMT does not have night coverage.  *Please note that this is a verbal dictation therefore any spelling or grammatical errors are due to the "[employer]" system interpretation.

## 2023-10-15 NOTE — Progress Notes (Signed)
PHARMACY - ANTICOAGULATION CONSULT NOTE  Pharmacy Consult for heparin Indication: atrial fibrillation  Allergies  Allergen Reactions   Desipramine Hives and Itching   Niacin Itching    Patient Measurements: Height: 5\' 8"  (172.7 cm) Weight: 110.1 kg (242 lb 11.6 oz) IBW/kg (Calculated) : 68.4 Heparin Dosing Weight: 89 kg  Vital Signs: Temp: 98 F (36.7 C) (01/16 2300) Temp Source: Oral (01/16 2300) BP: 161/49 (01/17 0600) Pulse Rate: 63 (01/17 0600)  Labs: Recent Labs    10/13/23 0304 10/13/23 2250 10/14/23 0258 10/15/23 0333  HGB 8.5*  --  8.6* 8.6*  HCT 27.7*  --  27.7* 28.2*  PLT 136*  --  167 168  HEPARINUNFRC  --  0.34 0.42 0.43  CREATININE 1.37*  --  1.25* 1.21    Estimated Creatinine Clearance: 64.5 mL/min (by C-G formula based on SCr of 1.21 mg/dL).  Assessment: 43 yoM presented 12/27 with melena and weakness. PMH AFib/Aflutter on Pradaxa. Found to have UGIB and was given vitamin K in addition to Praxbind x 2, FFP and PRBCs. EGD revealed 2 gastric AVMs with intermittent oozing s/p APC. Initially improved but then developed worsening hypoxia and imaging showed loculated moderate right pleural effusion. Chest tube was placed and is now s/p 3 doses of intrapleural lytics. On 1/15, patient was deemed safe to resume anticoagulation, and Pharmacy consulted to manage heparin for AFib.   10/15/2023: Heparin level remains therapeutic and quite stable on 1250 units/hr Hgb low but stable; Plt now improved to WNL  No complications of therapy noted  Goal of Therapy:  Heparin level 0.3-0.7 units/ml Monitor platelets by anticoagulation protocol: Yes   Plan:  Continue heparin infusion at 1250 units/hr  Daily heparin level and CBC while on heparin infusion Follow up transition to oral anticoagulation as appropriate  Shimeka Bacot A, PharmD, BCPS Clinical Pharmacist 10/15/2023 7:34 AM

## 2023-10-15 NOTE — Progress Notes (Addendum)
NAME:  Joshua [name], MRN:  [id number], DOB:  1951/01/13, LOS: 21 ADMISSION DATE:  09/24/2023, CONSULTATION DATE:  09/28/23 REFERRING MD:  Candelaria Stagers, MD, CHIEF COMPLAINT:  Pleural effusion   History of Present Illness:  73 year old male with AF/flutter on Pradaxa, CAD s/p stents in 2009, DM2, CKD IIIA, HTN, HLD, OA OSA not on CPAP with presented with weakness, dizziness, SOB and melena admitted to [facility] for symptomatic anemia secondary to GI bleed and AKI. Since hospitalization he has received IVF, PRBC x 2U, PPI, vitamin K, praxibind and FFP. Planned for EGD today or tomorrow.  Yesterday had increased O2 requirement to 5L associated with epistaxis. Seen by [facility] and improved to 2L via [place] after blowing out nasal blood clots. CXR demonstrated loculated right pleural effusion. PCCM consulted for evaluation for thoracentesis.  Has shortness of breath with movement. Improved with oxygen. Denies past history of pleural effusion or need for thoracentesis  Pertinent  Medical History  AF/flutter on Pradaxa, CAD s/p stents in 2009, DM2, CKD IIIA, HTN, HLD, OA OSA not on CPAP  Significant Hospital Events: Including procedures, antibiotic start and stop dates in addition to other pertinent events   S/p thoracentesis 09/28/23 with 150 cc blood fluid removed 1/1 large bore  Chest CT with complex right pleura gas and fluid collection and atelectasis of right lung. Right chest tube extending into right major fissues1/4 Chest tube  1/4 CT pulled back. Still not much output 1/6 CT removed. Spoke w/ Thoracic surg. Rec'd CT guided CT which was placed by IR. Cultures repeated  1/7 added zosyn for right sided infiltrate and purulent sputum. CT put out total of 475 ml since placed 1/6. Still exhibiting delirium. Stopping the sinemet. Getting lasix. Starting IS/Flutter and HT NaCl nebs 1/8 Family at bedside, reports delirium is better. He is currently sleeping.  Chest tube put out 240 in past 24 hours.  1/9 Rested  well overnight, delirium appears improved. out from chest in the last 24 hours - received first dose of TPA/DNAse 1/10 1.4L out from chest tube 1/13 third dose of lytics given  Interim History / Subjective:   No acute events overnight Chest tube drained only 70mL in the past 34 hours  Objective   Blood pressure (!) 130/56, pulse (!) 54, temperature 98.4 F (36.9 C), temperature source Oral, resp. rate (!) 25, height 5\' 8"  (1.727 m), weight 110.1 kg, SpO2 92%.        Intake/Output Summary (Last 24 hours) at 10/15/2023 1721 Last data filed at 10/15/2023 1615 Gross per 24 hour  Intake 452.24 ml  Output 610 ml  Net -157.76 ml   Filed Weights   10/03/23 0442 10/04/23 0500 10/05/23 0500  Weight: 112.8 kg 113.4 kg 110.1 kg   Physical Exam: General: elderly appearing male resting comfortably in bed HEENT: [place]/AT, PERRL, no JVD Neuro: Awake, eyes closed, answers questions and follows commands.  CV: RRR, no MRG PULM: Clear bilateral breath sounds GI: Soft, NT, ND Extremities: No acute deformity or ROM limitation Skin: No rash   Resolved Hospital Problem list   N/A  Assessment & Plan:  Acute hypoxic resp failure  epistaxis (resolved) Aspiration PNA w/ R basilar ASD  hemothorax (spont 2/2 coagulopathy) Small R apical ptx  Acute on chronic renal failure CKD stage IIIB H/o CAD HTN DM type II Acute delirium    Pulm prob list  Acute hypoxic resp failure 2/2 epistaxis and resultant aspiration, spont hemothorax (INR was 9.5) still w/  complicated right pleural space w/ on-going posterior fluid collection   - Completed 3 doses of pleural lytic therapy 1/13 -Completed 7 day zosyn course on 1/13  Minimal tube output. 70mL in the last 34 hours.   P: -Discontinue chest tube and place occlusive dressing.  -Pulmonary hygiene, hypertonic nebs, mobilize as able. -Anticoagulation resumed 1/16.  Acute delirium  P: -Continue supportive care. -Off Sinemet and  Seroquel. -Reorientation, correct underlying metabolic abnormalities, treat underlying conditions. -Hold all narcotics and benzodiasepines   Best Practice (right click and "Reselect all SmartList Selections" daily)   Diet/type: Regular consistency (see orders) DVT prophylaxis not indicated in setting of bleed Pressure ulcer(s): pressure ulcer assessment deferred  GI prophylaxis: PPI Lines: N/A Foley:  N/A Code Status:  full code Last date of multidisciplinary goals of care discussion [ per primary]  Critical care time: NA     Joneen Roach, AGACNP-BC  Pulmonary & Critical Care  See Amion for personal pager PCCM on call pager [phone] until 7pm. Please call Elink 7p-7a. [phone]  10/15/2023 5:30 PM

## 2023-10-15 NOTE — Progress Notes (Signed)
PCCM INTERVAL PROGRESS NOTE  Chest tube removed per protocol Occlusive dressing applied  Remove occlusive dressing in 48 hours Will place care order.  Joneen Roach, AGACNP-BC [facility]  See Amion for personal pager PCCM on call pager [phone] until 7pm. Please call Elink 7p-7a. [phone]  10/15/2023 6:01 PM

## 2023-10-15 NOTE — Plan of Care (Signed)
  Problem: Clinical Measurements: Goal: Respiratory complications will improve Outcome: Progressing   Problem: Elimination: Goal: Will not experience complications related to bowel motility Outcome: Progressing Goal: Will not experience complications related to urinary retention Outcome: Progressing   Problem: Nutrition: Goal: Adequate nutrition will be maintained Outcome: Not Progressing   Problem: Coping: Goal: Level of anxiety will decrease Outcome: Not Progressing

## 2023-10-16 ENCOUNTER — Inpatient Hospital Stay (HOSPITAL_COMMUNITY): Payer: Medicare HMO

## 2023-10-16 DIAGNOSIS — Z515 Encounter for palliative care: Secondary | ICD-10-CM | POA: Diagnosis not present

## 2023-10-16 DIAGNOSIS — K921 Melena: Secondary | ICD-10-CM | POA: Diagnosis not present

## 2023-10-16 DIAGNOSIS — Z7189 Other specified counseling: Secondary | ICD-10-CM | POA: Diagnosis not present

## 2023-10-16 DIAGNOSIS — J942 Hemothorax: Secondary | ICD-10-CM | POA: Diagnosis not present

## 2023-10-16 DIAGNOSIS — Z79899 Other long term (current) drug therapy: Secondary | ICD-10-CM

## 2023-10-16 LAB — CBC
HCT: 28.7 % — ABNORMAL LOW (ref 39.0–52.0)
Hemoglobin: 8.9 g/dL — ABNORMAL LOW (ref 13.0–17.0)
MCH: 29.9 pg (ref 26.0–34.0)
MCHC: 31 g/dL (ref 30.0–36.0)
MCV: 96.3 fL (ref 80.0–100.0)
Platelets: 170 10*3/uL (ref 150–400)
RBC: 2.98 MIL/uL — ABNORMAL LOW (ref 4.22–5.81)
RDW: 16.6 % — ABNORMAL HIGH (ref 11.5–15.5)
WBC: 5.5 10*3/uL (ref 4.0–10.5)
nRBC: 0 % (ref 0.0–0.2)

## 2023-10-16 LAB — HEPARIN LEVEL (UNFRACTIONATED): Heparin Unfractionated: 0.32 [IU]/mL (ref 0.30–0.70)

## 2023-10-16 NOTE — Progress Notes (Signed)
PROGRESS NOTE  Joshua [name] UJW:[id number] DOB: 01-Mar-1951 DOA: 09/24/2023 PCP: [name], MD   LOS: 22 days   Brief Narrative / Interim history: 73 year old male with history of PAF/flutter on Pradaxa, CAD status post stents 2009, DM2, CKD 3A, OSA not on CPAP, HTN, HLD comes into the hospital with weakness, dizziness, dyspnea, melena and lower extremity swelling for several weeks.  He was found to have a GI bleed, acute kidney injury with uremia and hyponatremia.  His anticoagulation was reversed, was given vitamin K, received FFP as well as 2 days of packed blood cells.  Gastroenterology was consulted and underwent an EGD on 1/1 with 2 gastric AVMs with intermittent oozing, status post APC.  GI recommended potential resumption of anticoagulation on 1/4, as well as PPI BID for 4 weeks followed by daily indefinitely.  Overall eventually improved, however had worsening hypoxia and imaging showed a loculated moderate right pleural effusion.  He had a chest tube placed which is now being managed with the assistance of pulmonology.  Subjective / 24h Interval events: Feels overall well, slept well.  No significant shortness of breath.  Chest tube discontinued 1/17.  Knows that he is in the hospital but thinks the year is 2026  Assesement and Plan: Principal Problem:   Gastrointestinal hemorrhage with melena Active Problems:   Type 2 diabetes mellitus (HCC)   Hyperlipidemia   Essential hypertension   Coronary artery disease due to lipid rich plaque   Paroxysmal atrial fibrillation (HCC)   Pulmonary hypertension (HCC)   Symptomatic anemia   ABLA (acute blood loss anemia)   Protein-calorie malnutrition, severe (HCC)   Thrombocytosis   Hyponatremia   AKI (acute kidney injury) (HCC)   CKD stage 3a, GFR 45-59 ml/min (HCC)   Hemothorax   Pneumothorax   Hemothorax on right   Pleural effusion   Palliative care encounter   Goals of care, counseling/discussion   Counseling and  coordination of care   Need for emotional support   UGIB (upper gastrointestinal bleed)   Odynophagia   Principal problem Right-sided hemothorax, loculated pleural effusion -pulmonary consulted and following.  Had a chest tube initially 1/1, no significant output, eventually had a CT-guided chest tube placement by IR on 1/6 after discussing case with cardiothoracic surgery.  Pulmonary following, status post lytic treatment with last dose 1/13.  Initial thoracentesis showed dark bloody stools, cultures are negative, fluid was found to have exudative, cytology was negative.  He was initially placed on Zosyn, completed a 7-day course -Chest tube removed 10/14/2022 -Has been stable on heparin, consider oral anticoagulation tomorrow if things remain stable  Active problems Symptomatic acute blood loss anemia due to upper GI bleed -patient was on Pradaxa on admission for a flutter.  He presented with melena for over 2 weeks, symptomatic.  Status post vitamin K as well as Praxbind on 12/28 and 12/30, also received FFP and 2 units of packed red blood cells.  GI consulted and underwent an EGD, found to have AVM's status post APC.  Clinically without further bleeding.  Per GI, anticoagulation could have been resumed on 1/4 -Heparin resumed 1/15 after discussing with pulmonology, hemoglobin has remained stable, no clinical evidence of further bleed, oral anticoagulation tomorrow  Acute hypoxic respiratory failure-wean off to room air as tolerated  ICU induced delirium, sundowning, generalized debility -seems to be waxing and waning, prior to this wife denies any memory issues, he was fully functional.  Suspect he will improve over time -Overall stable, mild intermittent  delirium but gradually improving.  Will transfer out of the ICU today, hopefully that along with removal of chest tube will improve his mental status  Acute kidney injury on CKD 3A-baseline creatinine around 1.4, acute kidney injury likely in  the setting of acute illness.  Creatinine has improved and is at baseline  DM2-A1c 5.5.  Essential hypertension-starting to be hypertensive, initially Coreg and Imdur were on hold due to hypotension, was briefly placed on midodrine which has now been discontinued.  Continue Coreg, blood pressure acceptable  PAF, paroxysmal SVT -continue Coreg today as above, Pradaxa on hold, started on anticoagulation 1/15 with heparin drip  Epistaxis-chronic issue from Pradaxa, resolved  Gross hematuria-due to coagulopathy, resolved.  Prior hospitalist discussed with Dr. Annabell Howells on 12/30, outpatient follow-up.  Watch now that he is back on heparin  Thrombocytopenia -thrombocytopenia resolved   Non-anion gap metabolic acidosis - Resolved.   Hyponatremia - Resolved.   Hypokalemia  -Monitor replenish as appropriate.   Hyperlipidemia -Continue rosuvastatin    Elevated INR - 9.5 on 12/28, S/p Praxbind, vitamin K and FFP.   Morbid obesity  - Body mass index is 36.91 kg/m.  Scheduled Meds:  carvedilol  6.25 mg Oral BID WC   Chlorhexidine Gluconate Cloth  6 each Topical Daily   lactulose  20 g Oral BID   pantoprazole (PROTONIX) IV  40 mg Intravenous Q12H   sodium chloride flush  10 mL Intravenous Q12H   sodium chloride flush  10 mL Intrapleural Q8H   sodium chloride flush  10 mL Intrapleural Q8H   sucralfate  1 g Oral TID WC & HS   tamsulosin  0.4 mg Oral Daily   Continuous Infusions:  heparin 1,250 Units/hr (10/16/23 0623)   PRN Meds:.acetaminophen **OR** acetaminophen, alum & mag hydroxide-simeth, hydrALAZINE, hydrocortisone cream, hydrOXYzine, lidocaine, melatonin, metoprolol tartrate, ondansetron **OR** ondansetron (ZOFRAN) IV, mouth rinse, sodium chloride, sodium chloride flush  Current Outpatient Medications  Medication Instructions   carvedilol (COREG) 12.5 mg, Oral, 2 times daily   fenofibrate 160 mg, Oral, Daily   ipratropium (ATROVENT) 0.06 % nasal spray 1 spray, Each Nare, 3 times  daily PRN   isosorbide mononitrate (IMDUR) 30 mg, Oral, Daily   nitroGLYCERIN (NITROSTAT) 0.4 mg, Sublingual, Every 5 min x3 PRN   olmesartan (BENICAR) 40 mg, Oral, Daily   pioglitazone (ACTOS) 15 mg, Oral, Daily   Pradaxa 150 mg, Oral, 2 times daily   rosuvastatin (CRESTOR) 20 mg, Oral, Daily   Tylenol 8 Hour Arthritis Pain 650-1,300 mg, Oral, Every 8 hours PRN   vitamin B-12 (CYANOCOBALAMIN) 500 mcg, Oral, Daily    Diet Orders (From admission, onward)     Start     Ordered   10/05/23 0831  Diet Heart Room service appropriate? Yes; Fluid consistency: Thin  Diet effective now       Question Answer Comment  Room service appropriate? Yes   Fluid consistency: Thin      10/05/23 0830            DVT prophylaxis: SCDs Start: 09/24/23 1503   Lab Results  Component Value Date   PLT 170 10/16/2023      Code Status: Limited: Do not attempt resuscitation (DNR) -DNR-LIMITED -Do Not Intubate/DNI   Family Communication: Wife present at bedside  Status is: Inpatient Remains inpatient appropriate because: severity of illness  Level of care: Telemetry  Consultants:  Pulmonary GI  Objective: Vitals:   10/16/23 0300 10/16/23 0527 10/16/23 0745 10/16/23 0903  BP:  (!) 137/47  Pulse:  75  (!) 59  Resp:  (!) 25    Temp: 98.4 F (36.9 C)  98.3 F (36.8 C)   TempSrc: Oral  Oral   SpO2:  94%    Weight:      Height:        Intake/Output Summary (Last 24 hours) at 10/16/2023 0944 Last data filed at 10/16/2023 0500 Gross per 24 hour  Intake 611.46 ml  Output --  Net 611.46 ml   Wt Readings from Last 3 Encounters:  10/05/23 110.1 kg  04/27/23 115.2 kg  03/18/23 119.9 kg    Examination:  Constitutional: NAD Eyes: lids and conjunctivae normal, no scleral icterus ENMT: mmm Neck: normal, supple Respiratory: clear to auscultation bilaterally, no wheezing, no crackles. Normal respiratory effort.  Cardiovascular: Regular rate and rhythm, no murmurs / rubs / gallops. No  LE edema. Abdomen: soft, no distention, no tenderness. Bowel sounds positive.    Data Reviewed: I have independently reviewed following labs and imaging studies   CBC Recent Labs  Lab 10/12/23 0258 10/13/23 0304 10/14/23 0258 10/15/23 0333 10/16/23 0810  WBC 3.1* 3.3* 3.7* 4.1 5.5  HGB 8.1* 8.5* 8.6* 8.6* 8.9*  HCT 26.6* 27.7* 27.7* 28.2* 28.7*  PLT 131* 136* 167 168 170  MCV 97.8 98.6 96.5 98.6 96.3  MCH 29.8 30.2 30.0 30.1 29.9  MCHC 30.5 30.7 31.0 30.5 31.0  RDW 16.6* 16.2* 16.5* 16.7* 16.6*    Recent Labs  Lab 10/09/23 1532 10/10/23 0350 10/12/23 0258 10/13/23 0304 10/14/23 0258 10/15/23 0333  NA 136  --  137 140 139 141  K 3.5  --  3.6 3.6 3.5 3.5  CL 106  --  106 108 108 109  CO2 22  --  23 22 21* 19*  GLUCOSE 86  --  96 81 80 73  BUN 18  --  25* 22 20 16   CREATININE 1.07  --  1.73* 1.37* 1.25* 1.21  CALCIUM 8.1*  --  8.0* 8.3* 8.5* 8.6*  AST  --  20  --   --  26  --   ALT  --  10  --   --  10  --   ALKPHOS  --  30*  --   --  39  --   BILITOT  --  1.4*  --   --  0.9  --   ALBUMIN  --  3.1* 2.6* 2.6* 2.8*  --   MG 1.7 1.8 1.9 1.9 1.9  --   TSH  --   --  8.248*  --   --   --   AMMONIA  --  15  --   --   --   --     ------------------------------------------------------------------------------------------------------------------ No results for input(s): "CHOL", "HDL", "LDLCALC", "TRIG", "CHOLHDL", "LDLDIRECT" in the last 72 hours.  Lab Results  Component Value Date   HGBA1C 5.5 09/24/2023   ------------------------------------------------------------------------------------------------------------------ No results for input(s): "TSH", "T4TOTAL", "T3FREE", "THYROIDAB" in the last 72 hours.  Invalid input(s): "FREET3"   Cardiac Enzymes No results for input(s): "CKMB", "TROPONINI", "MYOGLOBIN" in the last 168 hours.  Invalid input(s):  "CK" ------------------------------------------------------------------------------------------------------------------    Component Value Date/Time   BNP 295.2 (H) 09/27/2023 1614    CBG: No results for input(s): "GLUCAP" in the last 168 hours.  No results found for this or any previous visit (from the past 240 hours).    Radiology Studies: DG Chest Port 1 View Result Date: 10/16/2023 CLINICAL DATA:  72 year old male with  history of pleural effusion. EXAM: PORTABLE CHEST 1 VIEW COMPARISON:  Chest x-ray 10/15/2023. FINDINGS: Previously noted small bore right-sided chest tube has been removed. Elevation of the right hemidiaphragm again noted. Lung volumes are normal. No confluent consolidative airspace disease. Small right pleural effusion, some of which tracks along the right chest wall and into the minor fissure, similar to the prior study. No left pleural effusion. No pneumothorax. No evidence of pulmonary edema. Heart size appears borderline enlarged. Prominence of the right paratracheal contour, similar to the prior study, likely reflecting residual loculated pleural fluid. IMPRESSION: 1. Interval removal of right-sided chest tube with no evidence of pneumothorax. 2. Persistent small right pleural effusion, some of which appears loculated, as above. 3. Borderline cardiomegaly. Electronically Signed   By: Trudie Reed M.D.   On: 10/16/2023 08:04   DG CHEST PORT 1 VIEW Result Date: 10/15/2023 CLINICAL DATA:  Pleural effusion EXAM: PORTABLE CHEST 1 VIEW COMPARISON:  10/08/2023, CT 10/10/2023 FINDINGS: Left lung is grossly clear. Cardiomegaly. Small right-sided pleural effusion with loculation. Stable positioning of right lung base chest tube. No appreciable pneumothorax on this exam IMPRESSION: Stable positioning of right lung base chest tube. No appreciable pneumothorax on this exam. Small right-sided pleural effusion with loculation without great interval change compared to prior radiograph.  Electronically Signed   By: Jasmine Pang M.D.   On: 10/15/2023 19:10     Pamella Pert, MD, PhD [facility]  Between 7 am - 7 pm I am available, please contact me via Amion (for emergencies) or Securechat (non urgent messages)  Between 7 pm - 7 am I am not available, please contact night coverage MD/APP via Amion

## 2023-10-16 NOTE — Plan of Care (Signed)
  Problem: Education: Goal: Knowledge of General Education information will improve Description: Including pain rating scale, medication(s)/side effects and non-pharmacologic comfort measures Outcome: Progressing   Problem: Health Behavior/Discharge Planning: Goal: Ability to manage health-related needs will improve Outcome: Progressing   Problem: Clinical Measurements: Goal: Ability to maintain clinical measurements within normal limits will improve Outcome: Progressing Goal: Will remain free from infection Outcome: Progressing Goal: Diagnostic test results will improve Outcome: Progressing Goal: Respiratory complications will improve Outcome: Progressing Goal: Cardiovascular complication will be avoided Outcome: Progressing   Problem: Activity: Goal: Risk for activity intolerance will decrease Outcome: Progressing   Problem: Nutrition: Goal: Adequate nutrition will be maintained Outcome: Progressing   Problem: Coping: Goal: Level of anxiety will decrease Outcome: Progressing   Problem: Elimination: Goal: Will not experience complications related to bowel motility Outcome: Progressing Goal: Will not experience complications related to urinary retention Outcome: Progressing   Problem: Pain Management: Goal: General experience of comfort will improve Outcome: Progressing   Problem: Safety: Goal: Ability to remain free from injury will improve Outcome: Progressing   Problem: Skin Integrity: Goal: Risk for impaired skin integrity will decrease Outcome: Progressing   Problem: Education: Goal: Ability to describe self-care measures that may prevent or decrease complications (Diabetes Survival Skills Education) will improve Outcome: Progressing Goal: Individualized Educational Video(s) Outcome: Progressing   Problem: Coping: Goal: Ability to adjust to condition or change in health will improve Outcome: Progressing   Problem: Fluid Volume: Goal: Ability to  maintain a balanced intake and output will improve Outcome: Progressing   Problem: Health Behavior/Discharge Planning: Goal: Ability to identify and utilize available resources and services will improve Outcome: Progressing Goal: Ability to manage health-related needs will improve Outcome: Progressing   Problem: Metabolic: Goal: Ability to maintain appropriate glucose levels will improve Outcome: Progressing   Problem: Nutritional: Goal: Maintenance of adequate nutrition will improve Outcome: Progressing Goal: Progress toward achieving an optimal weight will improve Outcome: Progressing   Problem: Skin Integrity: Goal: Risk for impaired skin integrity will decrease Outcome: Progressing   Problem: Tissue Perfusion: Goal: Adequacy of tissue perfusion will improve Outcome: Progressing   Problem: Safety: Goal: Non-violent Restraint(s) Outcome: Progressing

## 2023-10-16 NOTE — Progress Notes (Signed)
PHARMACY - ANTICOAGULATION CONSULT NOTE  Pharmacy Consult for heparin Indication: atrial fibrillation  Allergies  Allergen Reactions   Desipramine Hives and Itching   Niacin Itching    Patient Measurements: Height: 5\' 8"  (172.7 cm) Weight: 104.2 kg (229 lb 12.8 oz) IBW/kg (Calculated) : 68.4 Heparin Dosing Weight: 89 kg  Vital Signs: Temp: 98.7 F (37.1 C) (01/18 1006) Temp Source: Oral (01/18 1006) BP: 168/78 (01/18 1006) Pulse Rate: 67 (01/18 1006)  Labs: Recent Labs    10/14/23 0258 10/15/23 0333 10/16/23 0253 10/16/23 0810  HGB 8.6* 8.6*  --  8.9*  HCT 27.7* 28.2*  --  28.7*  PLT 167 168  --  170  HEPARINUNFRC 0.42 0.43 0.32  --   CREATININE 1.25* 1.21  --   --     Estimated Creatinine Clearance: 62.7 mL/min (by C-G formula based on SCr of 1.21 mg/dL).  Assessment: 35 yoM presented 12/27 with melena and weakness. PMH AFib/Aflutter on Pradaxa. Found to have UGIB and was given vitamin K in addition to Praxbind x 2, FFP and PRBCs. EGD revealed 2 gastric AVMs with intermittent oozing s/p APC. Initially improved but then developed worsening hypoxia and imaging showed loculated moderate right pleural effusion. Chest tube was placed and is now s/p 3 doses of intrapleural lytics, chest tube removed on 1/17. On 1/15, patient was deemed safe to resume anticoagulation, and Pharmacy consulted to manage heparin for AFib.   10/16/2023: Heparin level 0.32, remains therapeutic and stable on 1250 units/hr CBC:  Hgb low at 8.9 but stable; Plt now improved to WNL  No complications, bleeding, or IV issues reported by RN.   Goal of Therapy:  Heparin level 0.3-0.7 units/ml Monitor platelets by anticoagulation protocol: Yes   Plan:  Continue heparin infusion at 1250 units/hr  Daily heparin level and CBC while on heparin infusion Follow up transition to oral anticoagulation, potentially on 1/19 per MD notes.     Lynann Beaver PharmD, BCPS [facility] main pharmacy [phone] 10/16/2023  10:14 AM

## 2023-10-16 NOTE — Progress Notes (Signed)
Daily Progress Note   Patient Name: Joshua [name]       Date: 10/16/2023 DOB: 1950-11-08  Age: 73 y.o. MRN#: [id number] Attending Physician: Leatha Gilding, MD Primary Care Physician: [name], MD Admit Date: 09/24/2023 Length of Stay: 22 days  Reason for Consultation/Follow-up: Establishing goals of care  Subjective:   CC: Patient laying in bed with eyes closed though will appropriately answer questions.  Following up regarding complex medical decision making.   Subjective:  Reviewed EMR prior to presenting to bedside. Chest tube removed 1/17. To be transferred out of ICU today. Patient only receive one dose of scheduled Carafate out of past 3 due to refusal.   Presented to bedside to see patient. Patient's wife and daughter also present at bedside. Patient laying in bed with eyes closed though will interact to appropraite answer questions at times. Patient notes that he is still having abdominal pain which is making him feel nauseated and unable to eat. Family noting patient has expressed desire to eat now. Discussed continuing Carafate to determine if could assist with pain. Patient currently agreeing to taking medication and family at bedside to encourage. Noted will try this and determine if medications need to be further adjusted. All questions answered at that time.Noted PMT would continue to follow along with medical journey.   Objective:   Vital Signs:  BP (!) 137/47   Pulse (!) 59   Temp 98.3 F (36.8 C) (Oral)   Resp (!) 25   Ht 5\' 8"  (1.727 m)   Wt 110.1 kg   SpO2 94%   BMI 36.91 kg/m   Physical Exam: General: Awake with eyes closed, chronically ill appearing Cardiovascular: tachycardia noted Respiratory: no increased work of breathing noted, not in respiratory distress, chest tube in place Neuro: awake with eyes closed, requiring mittens for agitation  Imaging:  I personally reviewed recent imaging.   Assessment & Plan:   Assessment: Patient  is a 73 year old M with a PMHx of PAF/flutter on Pradaxa, CAD s/p stents in 2009, DM-2, CKD-3A, OSA not on CPAP, HTN, HLD and osteoarthritis admitted on 09/23/24 for management of progressive generalized weakness, dizziness, dyspnea, melena, and edema for weeks. During hospitalization has received management for GI bleed, aspiration PNA, spontaneous hemothorax 2/2 coagulopathy s/p chest tube placement with lytic use, and delirium. Palliative medicine team consulted to assist with complex medical decision making.   Recommendations/Plan: # Complex medical decision making/goals of care:  -Continue attempts to discus care with patient, wife, and daughter. Hopefully delirium may slowly improve since now out of ICU and chest tube removed. PMT continuing to engage in conversations as able.    -  Code Status: Limited: Do not attempt resuscitation (DNR) -DNR-LIMITED -Do Not Intubate/DNI   # Symptom management  -Odynophagia No plaques seen in mouth on examination 1/17.   -Continue Carafate 1 g scheduled 3 times daily   -Continue oral viscous lidocaine as needed   -Consider to proactively treat with fluconazole if appropriate, would need to weigh risk of medication versus benefits  # Psychosocial Support:  -Wife, daughter  # Discharge Planning: To Be Determined  Discussed with: patient, RN, hospitalist, pharmacy   Thank you for allowing the palliative care team to participate in the care Joshua [name].  Alvester Morin, DO Palliative Care Provider PMT # [phone]  If patient remains symptomatic despite maximum doses, please call PMT at [phone] between 0700 and 1900. Outside of these hours, please call attending, as PMT does not have  night coverage.  *Please note that this is a verbal dictation therefore any spelling or grammatical errors are due to the "[employer] One" system interpretation.

## 2023-10-17 ENCOUNTER — Telehealth: Payer: Self-pay | Admitting: Pulmonary Disease

## 2023-10-17 DIAGNOSIS — K921 Melena: Secondary | ICD-10-CM | POA: Diagnosis not present

## 2023-10-17 DIAGNOSIS — J90 Pleural effusion, not elsewhere classified: Secondary | ICD-10-CM

## 2023-10-17 DIAGNOSIS — Z515 Encounter for palliative care: Secondary | ICD-10-CM | POA: Diagnosis not present

## 2023-10-17 DIAGNOSIS — J942 Hemothorax: Secondary | ICD-10-CM | POA: Diagnosis not present

## 2023-10-17 DIAGNOSIS — Z7189 Other specified counseling: Secondary | ICD-10-CM | POA: Diagnosis not present

## 2023-10-17 LAB — COMPREHENSIVE METABOLIC PANEL
ALT: 9 U/L (ref 0–44)
AST: 19 U/L (ref 15–41)
Albumin: 2.6 g/dL — ABNORMAL LOW (ref 3.5–5.0)
Alkaline Phosphatase: 44 U/L (ref 38–126)
Anion gap: 9 (ref 5–15)
BUN: 15 mg/dL (ref 8–23)
CO2: 22 mmol/L (ref 22–32)
Calcium: 8.4 mg/dL — ABNORMAL LOW (ref 8.9–10.3)
Chloride: 109 mmol/L (ref 98–111)
Creatinine, Ser: 1.16 mg/dL (ref 0.61–1.24)
GFR, Estimated: >60 mL/min (ref >60)
Glucose, Bld: 109 mg/dL — ABNORMAL HIGH (ref 70–99)
Potassium: 2.8 mmol/L — ABNORMAL LOW (ref 3.5–5.1)
Sodium: 140 mmol/L (ref 135–145)
Total Bilirubin: 0.8 mg/dL (ref 0.0–1.2)
Total Protein: 5.4 g/dL — ABNORMAL LOW (ref 6.5–8.1)

## 2023-10-17 LAB — CBC
HCT: 26.2 % — ABNORMAL LOW (ref 39.0–52.0)
Hemoglobin: 8.2 g/dL — ABNORMAL LOW (ref 13.0–17.0)
MCH: 30.4 pg (ref 26.0–34.0)
MCHC: 31.3 g/dL (ref 30.0–36.0)
MCV: 97 fL (ref 80.0–100.0)
Platelets: 152 10*3/uL (ref 150–400)
RBC: 2.7 MIL/uL — ABNORMAL LOW (ref 4.22–5.81)
RDW: 17 % — ABNORMAL HIGH (ref 11.5–15.5)
WBC: 4.5 10*3/uL (ref 4.0–10.5)
nRBC: 0 % (ref 0.0–0.2)

## 2023-10-17 LAB — PHOSPHORUS: Phosphorus: 2.8 mg/dL (ref 2.5–4.6)

## 2023-10-17 LAB — MAGNESIUM: Magnesium: 1.7 mg/dL (ref 1.7–2.4)

## 2023-10-17 LAB — HEPARIN LEVEL (UNFRACTIONATED): Heparin Unfractionated: 0.38 [IU]/mL (ref 0.30–0.70)

## 2023-10-17 MED ORDER — POTASSIUM CHLORIDE CRYS ER 20 MEQ PO TBCR
40.0000 meq | EXTENDED_RELEASE_TABLET | ORAL | Status: AC
  Administered 2023-10-17: 40 meq via ORAL
  Filled 2023-10-17 (×2): qty 2

## 2023-10-17 NOTE — Progress Notes (Signed)
PCCM Update:  Patient doing well after chest tube removal. I will arrange outpatient follow up with CT Chest in 2 months to follow the pleural effusion.  Please call with any questions.  Melody Comas, MD [facility] Office: [phone]   See Amion for personal pager PCCM on call pager [phone] until 7pm. Please call Elink 7p-7a. [phone]

## 2023-10-17 NOTE — Progress Notes (Signed)
PHARMACY - ANTICOAGULATION CONSULT NOTE  Pharmacy Consult for heparin Indication: atrial fibrillation  Allergies  Allergen Reactions   Desipramine Hives and Itching   Niacin Itching    Patient Measurements: Height: 5\' 8"  (172.7 cm) Weight: 100.2 kg (221 lb) IBW/kg (Calculated) : 68.4 Heparin Dosing Weight: 89 kg  Vital Signs: Temp: 98.2 F (36.8 C) (01/19 0557) Temp Source: Oral (01/19 0557) BP: 118/88 (01/19 0557) Pulse Rate: 64 (01/19 0557)  Labs: Recent Labs    10/15/23 0333 10/16/23 0253 10/16/23 0810 10/17/23 0407  HGB 8.6*  --  8.9* 8.2*  HCT 28.2*  --  28.7* 26.2*  PLT 168  --  170 152  HEPARINUNFRC 0.43 0.32  --  0.38  CREATININE 1.21  --   --  1.16    Estimated Creatinine Clearance: 64.1 mL/min (by C-G formula based on SCr of 1.16 mg/dL).  Assessment: 71 yoM presented 12/27 with melena and weakness. PMH AFib/Aflutter on Pradaxa. Found to have UGIB and was given vitamin K in addition to Praxbind x 2, FFP and PRBCs. EGD revealed 2 gastric AVMs with intermittent oozing s/p APC. Initially improved but then developed worsening hypoxia and imaging showed loculated moderate right pleural effusion. Chest tube was placed and is now s/p 3 doses of intrapleural lytics, chest tube removed on 1/17. On 1/15, patient was deemed safe to resume anticoagulation, and Pharmacy consulted to manage heparin for AFib.   10/17/2023: Heparin level 0.38, remains therapeutic and stable on 1250 units/hr CBC:  Hgb low at 8.2 but stable; Plt now improved to WNL  No complications, bleeding, or IV issues reported by RN.  Goal of Therapy:  Heparin level 0.3-0.7 units/ml Monitor platelets by anticoagulation protocol: Yes   Plan:  Continue heparin infusion at 1250 units/hr  Daily heparin level and CBC while on heparin infusion Follow up transition to oral anticoagulation   Lynann Beaver PharmD, BCPS [facility] main pharmacy [phone] 10/17/2023 8:21 AM

## 2023-10-17 NOTE — Progress Notes (Signed)
PROGRESS NOTE  Joshua [name] ZOX:[id number] DOB: 07-09-1951 DOA: 09/24/2023 PCP: [name], MD   LOS: 23 days   Brief Narrative / Interim history: 73 year old male with history of PAF/flutter on Pradaxa, CAD status post stents 2009, DM2, CKD 3A, OSA not on CPAP, HTN, HLD comes into the hospital with weakness, dizziness, dyspnea, melena and lower extremity swelling for several weeks.  He was found to have a GI bleed, acute kidney injury with uremia and hyponatremia.  His anticoagulation was reversed, was given vitamin K, received FFP as well as 2 days of packed blood cells.  Gastroenterology was consulted and underwent an EGD on 1/1 with 2 gastric AVMs with intermittent oozing, status post APC.  GI recommended potential resumption of anticoagulation on 1/4, as well as PPI BID for 4 weeks followed by daily indefinitely.  Overall eventually improved, however had worsening hypoxia and imaging showed a loculated moderate right pleural effusion.  He had a chest tube placed which is now being managed with the assistance of pulmonology.  Subjective / 24h Interval events: Appears slightly anxious today, tapping with his right finger and constantly spitting.  Wife is at bedside  Assesement and Plan: Principal Problem:   Gastrointestinal hemorrhage with melena Active Problems:   Type 2 diabetes mellitus (HCC)   Hyperlipidemia   Essential hypertension   Coronary artery disease due to lipid rich plaque   Paroxysmal atrial fibrillation (HCC)   Pulmonary hypertension (HCC)   Symptomatic anemia   ABLA (acute blood loss anemia)   Protein-calorie malnutrition, severe (HCC)   Thrombocytosis   Hyponatremia   AKI (acute kidney injury) (HCC)   CKD stage 3a, GFR 45-59 ml/min (HCC)   Hemothorax   Pneumothorax   Hemothorax on right   Pleural effusion   Palliative care encounter   Goals of care, counseling/discussion   Counseling and coordination of care   Need for emotional support   UGIB  (upper gastrointestinal bleed)   Odynophagia   Medication management   Principal problem Right-sided hemothorax, loculated pleural effusion -pulmonary consulted and following.  Had a chest tube initially 1/1, no significant output, eventually had a CT-guided chest tube placement by IR on 1/6 after discussing case with cardiothoracic surgery.  Pulmonary following, status post lytic treatment with last dose 1/13.  Initial thoracentesis showed dark bloody stools, cultures are negative, fluid was found to have exudative, cytology was negative.  He was initially placed on Zosyn, completed a 7-day course -Chest tube removed 10/14/2022 -Has been stable on heparin, however for hemoglobin with slight drop this morning, hold off oral agents  Active problems Symptomatic acute blood loss anemia due to upper GI bleed -patient was on Pradaxa on admission for a flutter.  He presented with melena for over 2 weeks, symptomatic.  Status post vitamin K as well as Praxbind on 12/28 and 12/30, also received FFP and 2 units of packed red blood cells.  GI consulted and underwent an EGD, found to have AVM's status post APC.  Clinically without further bleeding.  Per GI, anticoagulation could have been resumed on 1/4 -Heparin resumed 1/15 after discussing with pulmonology, hemoglobin with slight drop this morning, hold off oral agents for now  Acute hypoxic respiratory failure-currently on room air  ICU induced delirium, sundowning, generalized debility -seems to be waxing and waning, prior to this wife denies any memory issues, he was fully functional.  Suspect he will improve over time -Overall stable, mild intermittent delirium but gradually improving.  Continue to mobilize, encouraged  patient to sit up in the chair during the daytime  Acute kidney injury on CKD 3A-baseline creatinine around 1.4, acute kidney injury likely in the setting of acute illness.  Urine has improved  DM2-A1c 5.5.  Essential  hypertension-starting to be hypertensive, initially Coreg and Imdur were on hold due to hypotension, was briefly placed on midodrine which has now been discontinued.  Continue Coreg, blood pressure acceptable  PAF, paroxysmal SVT -continue Coreg today as above, Pradaxa on hold, started on anticoagulation 1/15 with heparin drip  Epistaxis-chronic issue from Pradaxa, resolved  Gross hematuria-due to coagulopathy, resolved.  Prior hospitalist discussed with Dr. Annabell Howells on 12/30, outpatient follow-up.  Watch now that he is back on heparin  Thrombocytopenia -thrombocytopenia resolved   Non-anion gap metabolic acidosis - Resolved.   Hyponatremia - Resolved.   Hypokalemia  -Monitor replenish as appropriate.   Hyperlipidemia -Continue rosuvastatin    Elevated INR - 9.5 on 12/28, S/p Praxbind, vitamin K and FFP.   Morbid obesity  - Body mass index is 36.91 kg/m.  Scheduled Meds:  carvedilol  6.25 mg Oral BID WC   Chlorhexidine Gluconate Cloth  6 each Topical Daily   lactulose  20 g Oral BID   pantoprazole (PROTONIX) IV  40 mg Intravenous Q12H   potassium chloride  40 mEq Oral Q4H   sodium chloride flush  10 mL Intravenous Q12H   sodium chloride flush  10 mL Intrapleural Q8H   sodium chloride flush  10 mL Intrapleural Q8H   sucralfate  1 g Oral TID WC & HS   tamsulosin  0.4 mg Oral Daily   Continuous Infusions:  heparin 1,250 Units/hr (10/17/23 0248)   PRN Meds:.acetaminophen **OR** acetaminophen, alum & mag hydroxide-simeth, hydrALAZINE, hydrocortisone cream, hydrOXYzine, lidocaine, metoprolol tartrate, ondansetron **OR** ondansetron (ZOFRAN) IV, mouth rinse, sodium chloride, sodium chloride flush  Current Outpatient Medications  Medication Instructions   carvedilol (COREG) 12.5 mg, Oral, 2 times daily   fenofibrate 160 mg, Oral, Daily   ipratropium (ATROVENT) 0.06 % nasal spray 1 spray, Each Nare, 3 times daily PRN   isosorbide mononitrate (IMDUR) 30 mg, Oral, Daily    nitroGLYCERIN (NITROSTAT) 0.4 mg, Sublingual, Every 5 min x3 PRN   olmesartan (BENICAR) 40 mg, Oral, Daily   pioglitazone (ACTOS) 15 mg, Oral, Daily   Pradaxa 150 mg, Oral, 2 times daily   rosuvastatin (CRESTOR) 20 mg, Oral, Daily   Tylenol 8 Hour Arthritis Pain 650-1,300 mg, Oral, Every 8 hours PRN   vitamin B-12 (CYANOCOBALAMIN) 500 mcg, Oral, Daily    Diet Orders (From admission, onward)     Start     Ordered   10/05/23 0831  Diet Heart Room service appropriate? Yes; Fluid consistency: Thin  Diet effective now       Question Answer Comment  Room service appropriate? Yes   Fluid consistency: Thin      10/05/23 0830            DVT prophylaxis: SCDs Start: 09/24/23 1503   Lab Results  Component Value Date   PLT 152 10/17/2023      Code Status: Limited: Do not attempt resuscitation (DNR) -DNR-LIMITED -Do Not Intubate/DNI   Family Communication: Wife present at bedside  Status is: Inpatient Remains inpatient appropriate because: severity of illness  Level of care: Telemetry  Consultants:  Pulmonary GI  Objective: Vitals:   10/16/23 1732 10/16/23 2321 10/17/23 0557 10/17/23 0600  BP: (!) 157/60 123/65 118/88   Pulse: 75 75 64   Resp:  Marland Kitchen)  21 20   Temp: 98.9 F (37.2 C) 98.4 F (36.9 C) 98.2 F (36.8 C)   TempSrc: Oral Oral Oral   SpO2: 94% 95% 95%   Weight:    100.2 kg  Height:        Intake/Output Summary (Last 24 hours) at 10/17/2023 1012 Last data filed at 10/17/2023 0600 Gross per 24 hour  Intake 250 ml  Output 200 ml  Net 50 ml   Wt Readings from Last 3 Encounters:  10/17/23 100.2 kg  04/27/23 115.2 kg  03/18/23 119.9 kg    Examination: Constitutional: NAD Eyes: lids and conjunctivae normal, no scleral icterus ENMT: mmm Neck: normal, supple Respiratory: clear to auscultation bilaterally, no wheezing, no crackles. Normal respiratory effort.  Cardiovascular: Regular rate and rhythm, no murmurs / rubs / gallops. No LE edema. Abdomen:  soft, no distention, no tenderness. Bowel sounds positive.   Data Reviewed: I have independently reviewed following labs and imaging studies   CBC Recent Labs  Lab 10/13/23 0304 10/14/23 0258 10/15/23 0333 10/16/23 0810 10/17/23 0407  WBC 3.3* 3.7* 4.1 5.5 4.5  HGB 8.5* 8.6* 8.6* 8.9* 8.2*  HCT 27.7* 27.7* 28.2* 28.7* 26.2*  PLT 136* 167 168 170 152  MCV 98.6 96.5 98.6 96.3 97.0  MCH 30.2 30.0 30.1 29.9 30.4  MCHC 30.7 31.0 30.5 31.0 31.3  RDW 16.2* 16.5* 16.7* 16.6* 17.0*    Recent Labs  Lab 10/12/23 0258 10/13/23 0304 10/14/23 0258 10/15/23 0333 10/17/23 0407  NA 137 140 139 141 140  K 3.6 3.6 3.5 3.5 2.8*  CL 106 108 108 109 109  CO2 23 22 21* 19* 22  GLUCOSE 96 81 80 73 109*  BUN 25* 22 20 16 15   CREATININE 1.73* 1.37* 1.25* 1.21 1.16  CALCIUM 8.0* 8.3* 8.5* 8.6* 8.4*  AST  --   --  26  --  19  ALT  --   --  10  --  9  ALKPHOS  --   --  39  --  44  BILITOT  --   --  0.9  --  0.8  ALBUMIN 2.6* 2.6* 2.8*  --  2.6*  MG 1.9 1.9 1.9  --  1.7  TSH 8.248*  --   --   --   --     ------------------------------------------------------------------------------------------------------------------ No results for input(s): "CHOL", "HDL", "LDLCALC", "TRIG", "CHOLHDL", "LDLDIRECT" in the last 72 hours.  Lab Results  Component Value Date   HGBA1C 5.5 09/24/2023   ------------------------------------------------------------------------------------------------------------------ No results for input(s): "TSH", "T4TOTAL", "T3FREE", "THYROIDAB" in the last 72 hours.  Invalid input(s): "FREET3"   Cardiac Enzymes No results for input(s): "CKMB", "TROPONINI", "MYOGLOBIN" in the last 168 hours.  Invalid input(s): "CK" ------------------------------------------------------------------------------------------------------------------    Component Value Date/Time   BNP 295.2 (H) 09/27/2023 1614    CBG: No results for input(s): "GLUCAP" in the last 168 hours.  No results  found for this or any previous visit (from the past 240 hours).    Radiology Studies: No results found.    Pamella Pert, MD, PhD [facility]  Between 7 am - 7 pm I am available, please contact me via Amion (for emergencies) or Securechat (non urgent messages)  Between 7 pm - 7 am I am not available, please contact night coverage MD/APP via Amion

## 2023-10-17 NOTE — Telephone Encounter (Signed)
Please schedule patient for hospital follow up with me in 2 months for pleural effusion. I have ordered CT Chest scan to be done in 2 months prior to the follow up visit.  Thanks, JD

## 2023-10-17 NOTE — Plan of Care (Signed)
  Problem: Clinical Measurements: Goal: Ability to maintain clinical measurements within normal limits will improve Outcome: Progressing Goal: Will remain free from infection Outcome: Progressing Goal: Diagnostic test results will improve Outcome: Progressing Goal: Respiratory complications will improve Outcome: Progressing Goal: Cardiovascular complication will be avoided Outcome: Progressing   Problem: Pain Management: Goal: General experience of comfort will improve Outcome: Progressing   Problem: Safety: Goal: Ability to remain free from injury will improve Outcome: Progressing   Problem: Tissue Perfusion: Goal: Adequacy of tissue perfusion will improve Outcome: Progressing

## 2023-10-18 ENCOUNTER — Inpatient Hospital Stay (HOSPITAL_COMMUNITY)
Admit: 2023-10-18 | Discharge: 2023-10-18 | Disposition: A | Discharge status: Home / Self Care | Payer: Medicare HMO | Attending: Internal Medicine | Admitting: Internal Medicine

## 2023-10-18 ENCOUNTER — Inpatient Hospital Stay (HOSPITAL_COMMUNITY): Payer: Medicare HMO

## 2023-10-18 DIAGNOSIS — J942 Hemothorax: Secondary | ICD-10-CM | POA: Diagnosis not present

## 2023-10-18 DIAGNOSIS — K921 Melena: Secondary | ICD-10-CM | POA: Diagnosis not present

## 2023-10-18 DIAGNOSIS — Z515 Encounter for palliative care: Secondary | ICD-10-CM | POA: Diagnosis not present

## 2023-10-18 DIAGNOSIS — R569 Unspecified convulsions: Secondary | ICD-10-CM

## 2023-10-18 DIAGNOSIS — Z7189 Other specified counseling: Secondary | ICD-10-CM | POA: Diagnosis not present

## 2023-10-18 DIAGNOSIS — R4182 Altered mental status, unspecified: Secondary | ICD-10-CM

## 2023-10-18 LAB — BASIC METABOLIC PANEL
Anion gap: 12 (ref 5–15)
BUN: 14 mg/dL (ref 8–23)
CO2: 21 mmol/L — ABNORMAL LOW (ref 22–32)
Calcium: 8.3 mg/dL — ABNORMAL LOW (ref 8.9–10.3)
Chloride: 109 mmol/L (ref 98–111)
Creatinine, Ser: 1.18 mg/dL (ref 0.61–1.24)
GFR, Estimated: >60 mL/min (ref >60)
Glucose, Bld: 93 mg/dL (ref 70–99)
Potassium: 3 mmol/L — ABNORMAL LOW (ref 3.5–5.1)
Sodium: 142 mmol/L (ref 135–145)

## 2023-10-18 LAB — CBC
HCT: 27.2 % — ABNORMAL LOW (ref 39.0–52.0)
Hemoglobin: 8.4 g/dL — ABNORMAL LOW (ref 13.0–17.0)
MCH: 29.9 pg (ref 26.0–34.0)
MCHC: 30.9 g/dL (ref 30.0–36.0)
MCV: 96.8 fL (ref 80.0–100.0)
Platelets: 168 10*3/uL (ref 150–400)
RBC: 2.81 MIL/uL — ABNORMAL LOW (ref 4.22–5.81)
RDW: 17.2 % — ABNORMAL HIGH (ref 11.5–15.5)
WBC: 5.8 10*3/uL (ref 4.0–10.5)
nRBC: 0 % (ref 0.0–0.2)

## 2023-10-18 LAB — URINALYSIS, ROUTINE W REFLEX MICROSCOPIC
Bilirubin Urine: NEGATIVE
Glucose, UA: 50 mg/dL — AB
Ketones, ur: 5 mg/dL — AB
Leukocytes,Ua: NEGATIVE
Nitrite: NEGATIVE
Protein, ur: >=300 mg/dL — AB
Specific Gravity, Urine: 1.024 (ref 1.005–1.030)
pH: 5 (ref 5.0–8.0)

## 2023-10-18 LAB — PHOSPHORUS: Phosphorus: 2 mg/dL — ABNORMAL LOW (ref 2.5–4.6)

## 2023-10-18 LAB — MAGNESIUM: Magnesium: 1.5 mg/dL — ABNORMAL LOW (ref 1.7–2.4)

## 2023-10-18 LAB — HEPARIN LEVEL (UNFRACTIONATED)
Heparin Unfractionated: 0.21 [IU]/mL — ABNORMAL LOW (ref 0.30–0.70)
Heparin Unfractionated: 0.25 [IU]/mL — ABNORMAL LOW (ref 0.30–0.70)

## 2023-10-18 LAB — HIV ANTIBODY (ROUTINE TESTING W REFLEX): HIV Screen 4th Generation wRfx: NONREACTIVE

## 2023-10-18 MED ORDER — POTASSIUM PHOSPHATES 15 MMOLE/5ML IV SOLN
30.0000 mmol | Freq: Once | INTRAVENOUS | Status: AC
  Administered 2023-10-18: 30 mmol via INTRAVENOUS
  Filled 2023-10-18: qty 10

## 2023-10-18 MED ORDER — POTASSIUM CHLORIDE CRYS ER 20 MEQ PO TBCR
40.0000 meq | EXTENDED_RELEASE_TABLET | Freq: Once | ORAL | Status: AC
  Administered 2023-10-18: 40 meq via ORAL
  Filled 2023-10-18: qty 2

## 2023-10-18 MED ORDER — MAGNESIUM SULFATE 2 GM/50ML IV SOLN
2.0000 g | Freq: Once | INTRAVENOUS | Status: AC
  Administered 2023-10-18: 2 g via INTRAVENOUS
  Filled 2023-10-18: qty 50

## 2023-10-18 NOTE — Progress Notes (Signed)
PHARMACY - ANTICOAGULATION CONSULT NOTE  Pharmacy Consult for heparin Indication: atrial fibrillation  Allergies  Allergen Reactions   Desipramine Hives and Itching   Niacin Itching    Patient Measurements: Height: 5\' 8"  (172.7 cm) Weight: 100.2 kg (221 lb) IBW/kg (Calculated) : 68.4 Heparin Dosing Weight: 89 kg  Vital Signs: Temp: 98.6 F (37 C) (01/20 0219) Temp Source: Axillary (01/20 0219) BP: 148/102 (01/19 2108) Pulse Rate: 102 (01/19 2108)  Labs: Recent Labs    10/16/23 0253 10/16/23 0810 10/16/23 0810 10/17/23 0407 10/18/23 0258  HGB  --  8.9*   < > 8.2* 8.4*  HCT  --  28.7*  --  26.2* 27.2*  PLT  --  170  --  152 168  HEPARINUNFRC 0.32  --   --  0.38 0.21*  CREATININE  --   --   --  1.16 1.18   < > = values in this interval not displayed.    Estimated Creatinine Clearance: 63 mL/min (by C-G formula based on SCr of 1.18 mg/dL).  Assessment: 59 yoM presented 12/27 with melena and weakness. PMH AFib/Aflutter on Pradaxa. Found to have UGIB and was given vitamin K in addition to Praxbind x 2, FFP and PRBCs. EGD revealed 2 gastric AVMs with intermittent oozing s/p APC. Initially improved but then developed worsening hypoxia and imaging showed loculated moderate right pleural effusion. Chest tube was placed and is now s/p 3 doses of intrapleural lytics, chest tube removed on 1/17. On 1/15, patient was deemed safe to resume anticoagulation, and Pharmacy consulted to manage heparin for AFib.   10/18/2023: Heparin level 0.21, (subtherapeutic this AM)  on 1250 units/hr CBC:  Hgb low at 8.4 but stable; Plt WNL  No complications, bleeding, or IV issues reported by RN.  RN confirm heparin infusing without interruption.  Goal of Therapy:  Heparin level 0.3-0.7 units/ml Monitor platelets by anticoagulation protocol: Yes   Plan:  Increase heparin infusion to 1350 units/hr  Check heparin level 8 hr after rate increase Daily heparin level and CBC while on heparin  infusion Follow up transition to oral anticoagulation   Terrilee Files, PharmD 10/18/2023 4:01 AM

## 2023-10-18 NOTE — Progress Notes (Signed)
Daily Progress Note   Patient Name: Joshua [name]       Date: 10/18/2023 DOB: 1951-02-21  Age: 73 y.o. MRN#: [id number] Attending Physician: Leatha Gilding, MD Primary Care Physician: [name], MD Admit Date: 09/24/2023 Length of Stay: 24 days  Reason for Consultation/Follow-up: Establishing goals of care  Subjective:   CC: Patient laying in bed for EEG. Following up regarding complex medical decision making.   Subjective:  Reviewed EMR prior to presenting to bedside.  Discussed care with hospitalist for medical updates as well.  Patient still having difficulties with spitting, oral intake, and refusing medications.  Hospitalist appropriately ordered an EEG and further imaging on patient as now having fever.  Neurology has now been consulted for evaluation.  Presented to bedside to meet with patient.  EEG tech present at bedside to obtain test for patient so did not interrupt.  Did speak with patient's wife outside of the room.  Discussed current plans for today.  I again confirmed to that patient's spitting is worsening and his refusal to take medications.  Wife is hoping that further workup may at least rule out some causes of this.  Noted would discontinue Carafate since patient has been refusing at this time anyway.  Spent time providing emotional support reactive listening.  Noted palliative medicine team will continue to follow along with patient's medical journey.  All questions answered at that time.  Thanked wife for allowing me to speak with her today.  Discussed care with hospitalist after visit.  Objective:   Vital Signs:  BP (!) 147/58 (BP Location: Right Arm)   Pulse 66   Temp 98.5 F (36.9 C) (Oral)   Resp 14   Ht 5\' 8"  (1.727 m)   Wt 100.2 kg   SpO2 94%   BMI 33.60 kg/m   Physical Exam: General: Awake with eyes closed, chronically ill appearing Cardiovascular: tachycardia noted Respiratory: no increased work of breathing noted, not in respiratory  distress, chest tube in place Neuro: awake with eyes closed, requiring mittens for agitation  Imaging:  I personally reviewed recent imaging.   Assessment & Plan:   Assessment: Patient is a 73 year old M with a PMHx of PAF/flutter on Pradaxa, CAD s/p stents in 2009, DM-2, CKD-3A, OSA not on CPAP, HTN, HLD and osteoarthritis admitted on 09/23/24 for management of progressive generalized weakness, dizziness, dyspnea, melena, and edema for weeks. During hospitalization has received management for GI bleed, aspiration PNA, spontaneous hemothorax 2/2 coagulopathy s/p chest tube placement with lytic use, and delirium. Palliative medicine team consulted to assist with complex medical decision making.   Recommendations/Plan: # Complex medical decision making/goals of care:  -Continue attempts to discus care with patient, wife, and daughter.  Patient now obtaining further workup for causes of change in mentation.  EEG ordered and neurology consulted.  Patient also has repeat imaging ordered for increase in temperature.  Palliative medicine team will continue to follow along to engage in conversations as able moving forward.  -  Code Status: Limited: Do not attempt resuscitation (DNR) -DNR-LIMITED -Do Not Intubate/DNI   # Symptom management  -Odynophagia   -Discontinue Carafate 1 g scheduled 3 times daily   -Continue oral viscous lidocaine as needed  # Psychosocial Support:  -Wife, daughter  # Discharge Planning: To Be Determined  Discussed with: patient, hospitalist, wife  Thank you for allowing the palliative care team to participate in the care Lucio Edward.  Alvester Morin, DO Palliative Care Provider PMT # [phone]  If patient remains symptomatic despite maximum doses, please call PMT at [phone] between 0700 and 1900. Outside of these hours, please call attending, as PMT does not have night coverage.  Personally spent 36 minutes in patient care including extensive chart review  (labs, imaging, progress/consult notes, vital signs), medically appropraite exam, discussed with treatment team, education to patient, family, and staff, documenting clinical information, medication review and management, coordination of care, and available advanced directive documents.

## 2023-10-18 NOTE — Progress Notes (Signed)
EEG complete - results pending 

## 2023-10-18 NOTE — Progress Notes (Signed)
PHARMACY - ANTICOAGULATION CONSULT NOTE  Pharmacy Consult for heparin Indication: atrial fibrillation  Allergies  Allergen Reactions   Desipramine Hives and Itching   Niacin Itching    Patient Measurements: Height: 5\' 8"  (172.7 cm) Weight: 100.2 kg (221 lb) IBW/kg (Calculated) : 68.4 Heparin Dosing Weight: 89 kg  Vital Signs: Temp: 98.8 F (37.1 C) (01/20 1245) Temp Source: Oral (01/20 1245) BP: 137/56 (01/20 1245) Pulse Rate: 63 (01/20 1245)  Labs: Recent Labs    10/16/23 0810 10/17/23 0407 10/18/23 0258 10/18/23 1358  HGB 8.9* 8.2* 8.4*  --   HCT 28.7* 26.2* 27.2*  --   PLT 170 152 168  --   HEPARINUNFRC  --  0.38 0.21* 0.25*  CREATININE  --  1.16 1.18  --     Estimated Creatinine Clearance: 63 mL/min (by C-G formula based on SCr of 1.18 mg/dL).  Assessment: 60 yoM presented 12/27 with melena and weakness. PMH AFib/Aflutter on Pradaxa. Found to have UGIB and was given vitamin K in addition to Praxbind x 2, FFP and PRBCs. EGD revealed 2 gastric AVMs with intermittent oozing s/p APC. Initially improved but then developed worsening hypoxia and imaging showed loculated moderate right pleural effusion. Chest tube was placed and is now s/p 3 doses of intrapleural lytics, chest tube removed on 1/17. On 1/15, patient was deemed safe to resume anticoagulation, and Pharmacy consulted to manage heparin for AFib.   10/18/2023: Heparin level higher but still subtherapeutic on recheck, despite rate increase to 1350 units/hr; had previously been stable for the past 6 days on 1250 units/hr CBC: Hgb low at 8.4 but stable; Plt WNL  SCr stable WNL No bleeding or infusion issues per RN noted  Goal of Therapy:  Heparin level 0.3-0.7 units/ml Monitor platelets by anticoagulation protocol: Yes   Plan:  Increase heparin infusion to 1250 units/hr  Check heparin level 8 hr after rate increase Daily heparin level and CBC while on heparin infusion Follow up transition to oral  anticoagulation   Bernadene Person, PharmD, BCPS [phone] 10/18/2023, 3:37 PM

## 2023-10-18 NOTE — Consult Note (Addendum)
NEUROLOGY CONSULT NOTE   Date of service: October 18, 2023 Patient Name: Joshua [name] MRN:  [id number] DOB:  September 30, 1950 Chief Complaint: "GI Bleed" Requesting Provider: Leatha Gilding, MD  History of Present Illness  Joshua [name] is a 73 y.o. male  has a past medical history of Arthritis, Atrial fibrillation (HCC), Atrial flutter (HCC), Back pain, CAD (coronary artery disease), Complication of anesthesia, DM (diabetes mellitus) (HCC), Elevated PSA, Gout, Hearing problem, HTN (hypertension), Hyperlipidemia, Myocardial infarct, old (2010), Prostatitis, and Renal insufficiency. who initially presented on 09/24/2023 with a GI bleed after having progressive weakness, dizziness, and dyspnea and 2 weeks of melena. Hgb from 13 -> 7.0 on arrival to ED. Received PRBC x 2U, PPI, vitamin K, praxibind and FFP. Thoracentesis done 12/31 with 150cc of bloody fluid removed. 1/2 EGD showed two gastric AVMs, one of which was oozing and successfully coagulated.   He was initially on Pradaxa for his A-fib, a flutter and is now on a heparin drip. Hgb has been stable   Wife states he has been restless/figidity/tics?  Wife states that the elevate his arms on pillow and he will take them and move them back and forth. He will move the sheets in his fingers and then started spitting, especially after eating food (which he states is because the food tastes terrible). On NP exam today he is persistently wiping his nose with tissues and has to be told to stop to participate in exam. Is able to answer orientation questions. Wife states today is the first day he ate breakfast independently with less spitting. Wife has seen improvement from yesterday to today. Yesterday, he was able to to get to the chair with a mod/max assist to sit, stand, and pivot. He has not ambulated yet.   Movements are not stereotyped.  He does not describe significant urge to move / restlessness prior to having these movements.  He is able to suppress  the movements.  He has different movements at different times sometimes with the left hand, sometimes the right hand.  ROS  Comprehensive ROS performed and pertinent positives documented in HPI   Past History   Past Medical History:  Diagnosis Date   Arthritis    Atrial fibrillation (HCC)    Atrial flutter (HCC)    Back pain    CAD (coronary artery disease)    stents placed 2009   Complication of anesthesia    woke up during surgery and ablation   DM (diabetes mellitus) (HCC)    type 2   Elevated PSA    Gout    Hearing problem    HTN (hypertension)    Hyperlipidemia    Myocardial infarct, old 2010   Prostatitis    Renal insufficiency     Past Surgical History:  Procedure Laterality Date   CARDIAC CATHETERIZATION  11/07/07   with 2 stents   COLONOSCOPY     Electrophysiologic study and RF catheter ablation   of AV node reentrant tachycardia.     ESOPHAGOGASTRODUODENOSCOPY (EGD) WITH PROPOFOL N/A 09/30/2023   Procedure: ESOPHAGOGASTRODUODENOSCOPY (EGD) WITH PROPOFOL;  Surgeon: Lynann Bologna, MD;  Location: [facility] ENDOSCOPY;  Service: Gastroenterology;  Laterality: N/A;   EXTERNAL EAR SURGERY Right    cartilage   HOT HEMOSTASIS N/A 09/30/2023   Procedure: HOT HEMOSTASIS (ARGON PLASMA COAGULATION/BICAP);  Surgeon: Lynann Bologna, MD;  Location: [facility];  Service: Gastroenterology;  Laterality: N/A;   LEFT AND RIGHT HEART CATHETERIZATION WITH CORONARY/GRAFT ANGIOGRAM N/A 09/27/2014   Procedure: LEFT  AND RIGHT HEART CATHETERIZATION WITH Isabel Caprice;  Surgeon: Lesleigh Noe, MD;  Location: [facility];  Service: Cardiovascular;  Laterality: N/A;   LUMBAR LAMINECTOMY/DECOMPRESSION MICRODISCECTOMY Left 04/29/2018   Procedure: Left Lumbar Four-Five Laminectomy/Foraminotomy;  Surgeon: Tia Alert, MD;  Location: [facility] OR;  Service: Neurosurgery;  Laterality: Left;  Left Lumbar Four-Five Laminectomy/Foraminotomy   TOOTH EXTRACTION     VASECTOMY      Family  History: Family History  Problem Relation Age of Onset   Hypertension Mother    Gout Mother    Alzheimer's disease Mother    Heart failure Father    Heart disease Brother    Cancer Brother        type unknown    Social History  reports that he has never smoked. He has quit using smokeless tobacco.  His smokeless tobacco use included chew. He reports that he does not drink alcohol and does not use drugs.  Allergies  Allergen Reactions   Desipramine Hives and Itching   Niacin Itching    Medications   Current Facility-Administered Medications:    acetaminophen (TYLENOL) tablet 650 mg, 650 mg, Oral, Q6H PRN, 650 mg at 10/16/23 2207 **OR** acetaminophen (TYLENOL) suppository 650 mg, 650 mg, Rectal, Q6H PRN, Gonfa, Taye T, MD   alum & mag hydroxide-simeth (MAALOX/MYLANTA) 200-200-20 MG/5ML suspension 30 mL, 30 mL, Oral, Q4H PRN, Alanda Slim, Taye T, MD, 30 mL at 10/10/23 2237   carvedilol (COREG) tablet 6.25 mg, 6.25 mg, Oral, BID WC, Leatha Gilding, MD, 6.25 mg at 10/18/23 4782   Chlorhexidine Gluconate Cloth 2 % PADS 6 each, 6 each, Topical, Daily, Alanda Slim, Taye T, MD, 6 each at 10/18/23 0848   heparin ADULT infusion 100 units/mL (25000 units/274mL), 1,350 Units/hr, Intravenous, Continuous, Poindexter, Leann T, RPH, Last Rate: 13.5 mL/hr at 10/18/23 0419, 1,350 Units/hr at 10/18/23 0419   hydrALAZINE (APRESOLINE) injection 10 mg, 10 mg, Intravenous, Q4H PRN, Leatha Gilding, MD, 10 mg at 10/14/23 1421   hydrocortisone cream 1 %, , Topical, PRN, Rai, Ripudeep K, MD, Given at 10/01/23 2242   hydrOXYzine (ATARAX) tablet 10 mg, 10 mg, Oral, TID PRN, Leatha Gilding, MD, 10 mg at 10/16/23 2207   lactulose (CHRONULAC) 10 GM/15ML solution 20 g, 20 g, Oral, BID, Harris, Whitney D, NP, 20 g at 10/14/23 1047   lidocaine (XYLOCAINE) 2 % viscous mouth solution 15 mL, 15 mL, Mouth/Throat, Q4H PRN, Mims, Lauren W, DO   magnesium sulfate IVPB 2 g 50 mL, 2 g, Intravenous, Once, Leatha Gilding, MD,  Last Rate: 50 mL/hr at 10/18/23 0841, 2 g at 10/18/23 0841   metoprolol tartrate (LOPRESSOR) injection 2.5 mg, 2.5 mg, Intravenous, Q4H PRN, Gonfa, Taye T, MD   ondansetron (ZOFRAN) tablet 4 mg, 4 mg, Oral, Q6H PRN **OR** ondansetron (ZOFRAN) injection 4 mg, 4 mg, Intravenous, Q6H PRN, Alanda Slim, Taye T, MD, 4 mg at 10/10/23 0159   Oral care mouth rinse, 15 mL, Mouth Rinse, PRN, Gonfa, Taye T, MD   [EXPIRED] pantoprazole (PROTONIX) injection 40 mg, 40 mg, Intravenous, Q5 min **FOLLOWED BY** pantoprazole (PROTONIX) injection 40 mg, 40 mg, Intravenous, Q12H, Gonfa, Taye T, MD, 40 mg at 10/18/23 0848   potassium PHOSPHATE 30 mmol in dextrose 5 % 500 mL infusion, 30 mmol, Intravenous, Once, Gherghe, Costin M, MD   sodium chloride (OCEAN) 0.65 % nasal spray 2 spray, 2 spray, Each Nare, PRN, Candelaria Stagers T, MD, 2 spray at 10/08/23 0302   sodium chloride flush (NS)  0.9 % injection 10 mL, 10 mL, Intravenous, Q12H, Rai, Ripudeep K, MD, 10 mL at 10/18/23 0849   sodium chloride flush (NS) 0.9 % injection 10 mL, 10 mL, Intrapleural, Q8H, Dewald, Jonathan B, MD, 10 mL at 10/15/23 1324   sodium chloride flush (NS) 0.9 % injection 10 mL, 10 mL, Intrapleural, Q8H, Dewald, Jonathan B, MD, 10 mL at 10/15/23 1324   sodium chloride flush (NS) 0.9 % injection 10-40 mL, 10-40 mL, Intracatheter, PRN, Rai, Ripudeep K, MD, 10 mL at 10/04/23 2134   sucralfate (CARAFATE) 1 GM/10ML suspension 1 g, 1 g, Oral, TID WC & HS, Mims, Lauren W, DO, 1 g at 10/18/23 0840   tamsulosin (FLOMAX) capsule 0.4 mg, 0.4 mg, Oral, Daily, Rai, Ripudeep K, MD, 0.4 mg at 10/18/23 0838  Vitals   Vitals:   11-08-2023 2108 10/18/23 0151 10/18/23 0219 10/18/23 0518  BP: (!) 148/102   (!) 147/58  Pulse: (!) 102   66  Resp: 18   14  Temp: 99.5 F (37.5 C) 100 F (37.8 C) 98.6 F (37 C) 98.5 F (36.9 C)  TempSrc: Oral Axillary Axillary Oral  SpO2: 93%   94%  Weight:      Height:        Body mass index is 33.6 kg/m.  Physical Exam    Constitutional: Appears well-developed and well-nourished.  Psych: Affect flat Eyes: No scleral injection.  HENT: No OP obstruction.  Head: Normocephalic.  Cardiovascular: Normal rate and regular rhythm.  Respiratory: Effort normal, non-labored breathing.  GI: Soft.  No distension. There is no tenderness.  Skin: Skin on bilaterally lower extremities is darker, Hx of PVD  Neurologic Examination  Neuro: Mental Status: Patient is awake, alert, oriented to person, place, month, initially says 2026 but corrects himself to 2025, and situation.  When initially asked to add a quarter, time, nickel, penny he is unable to but when done step-by-step with assistance he is able to accurately add.  Patient is able to give a limited history of why he is in the hospital No signs of aphasia or neglect Cranial Nerves: II: Visual Fields are full. Pupils are equal, round, and reactive to light.   III,IV, VI: EOMI without ptosis or diploplia.  V: Facial sensation is symmetric to temperature VII: Facial movement is symmetric resting and smiling VIII: Hard of hearing at baseline hearing aids not in during exam X: Palate elevates symmetrically XI: Shoulder shrug is symmetric. XII: Tongue protrudes midline without atrophy or fasciculations.  Motor: Tone is normal. Bulk is normal. 5/5 strength was present in all four extremities.  No asterixis.  No pronator drift. Sensory: Sensation is symmetric to light touch and temperature in the arms and legs. No extinction to DSS present.  Deep Tendon Reflexes: 2+ and symmetric in the biceps and patellae.  Plantars: Toes are downgoing bilaterally.  Cerebellar: Intention tremor noted in the left upper extremity at end reach more than on the right.  Heel-to-shin intact bilaterally  Later attending examination is similar to what is documented above by NP, he is fully oriented without any delay for me.  He does have a flat affect and some paucity of speech overall but  able to name and repeat and overall describes being frustrated at his situation  Labs/Imaging/Neurodiagnostic studies   CBC:  Recent Labs  Lab 11-08-2023 0407 10/18/23 0258  WBC 4.5 5.8  HGB 8.2* 8.4*  HCT 26.2* 27.2*  MCV 97.0 96.8  PLT 152 168    Basic Metabolic  Panel:  Lab Results  Component Value Date   NA 142 10/18/2023   K 3.0 (L) 10/18/2023   CO2 21 (L) 10/18/2023   GLUCOSE 93 10/18/2023   BUN 14 10/18/2023   CREATININE 1.18 10/18/2023   CALCIUM 8.3 (L) 10/18/2023   GFRNONAA >60 10/18/2023   GFRAA >60 04/26/2018    Lipid Panel:  Lab Results  Component Value Date   LDLCALC 75 04/29/2010    HgbA1c:  Lab Results  Component Value Date   HGBA1C 5.5 09/24/2023   Vitamin B12: Lab Results  Component Value Date   VITAMINB12 2,137 (H) 09/25/2023   Ammonia Lab Results  Component Value Date   AMMONIA 15 10/10/2023   INR  Lab Results  Component Value Date   INR 1.1 10/06/2023    APTT  Lab Results  Component Value Date   APTT 48 (H) 09/28/2023    AED levels: No results found for: "PHENYTOIN", "ZONISAMIDE", "LAMOTRIGINE", "LEVETIRACETA"    10/08/2023 CT Head without contrast(Personally reviewed): Motion limited. Within this limitation, no acute intracranial process.  Repeat CT Head - pending   Neurodiagnostics 10/18/2023- rEEG:  This study is within normal limits. No seizures or epileptiform discharges were seen throughout the recording.  A normal interictal EEG does not exclude the diagnosis of epilepsy.  ASSESSMENT   Joshua [name] is a 73 y.o. male  has a past medical history of Arthritis, Atrial fibrillation (HCC), Atrial flutter (HCC), Back pain, CAD (coronary artery disease), Complication of anesthesia, DM (diabetes mellitus) (HCC), Elevated PSA, Gout, Hearing problem, HTN (hypertension), Hyperlipidemia, Myocardial infarct, old (2010), Prostatitis, and Renal insufficiency.  Neurology requested for evaluation of altered mental status / abnormal  movements  Overall his examination is consistent with mild delirium, some restlessness/akathisia, without features concerning for seizure activity  Certainly atrial fibrillation with anticoagulation having to be held for some time puts him at risk for stroke.  Reasonable to repeat a head CT in the setting given MRI cannot be obtained  No spitting is observed on our evaluations today so it is difficult for me to comment on the etiology of this; patient reports it is secondary to palatability but this does not seem to explain the extent of the spitting described by family and primary team.  However at this point it does not seem to be an ongoing issue, if it recurs a video may be helpful to clarify.  RECOMMENDATIONS  - EEG, completed and unremarkable as above - B1, RPR, HIV pending pending, treat as needed - Blood cultures pending  - Delirium precautions - Appreciate excellent medical care of comorbidities per primary team - Repeat CT Head pending, if this is negative for acute process neurology will sign off Addendum: negative study, neurology signing off   ______________________________________________________________________  Patient seen and examined by NP/APP with MD. MD to update note as needed.   Elmer Picker, DNP, FNP-BC [facility] Pager: [phone]  Attending Neurologist's note:  I personally saw this patient, gathering history, performing a full neurologic examination, reviewing relevant labs, personally reviewing relevant imaging including Head CT on 1/10, and formulated the assessment and plan, adding the note above for completeness and clarity to accurately reflect my thoughts  Brooke Dare MD-PhD [facility] [phone] Available 7 AM to 7 PM, outside these hours please contact Neurologist on call listed on AMION  Discussed with primary team via secure chat

## 2023-10-18 NOTE — Progress Notes (Addendum)
PROGRESS NOTE  DRAGON STAGG EAV:[id number] DOB: 1951/02/15 DOA: 09/24/2023 PCP: [name], MD   LOS: 24 days   Brief Narrative / Interim history: 73 year old male with history of PAF/flutter on Pradaxa, CAD status post stents 2009, DM2, CKD 3A, OSA not on CPAP, HTN, HLD comes into the hospital with weakness, dizziness, dyspnea, melena and lower extremity swelling for several weeks.  He was found to have a GI bleed, acute kidney injury with uremia and hyponatremia.  His anticoagulation was reversed, was given vitamin K, received FFP as well as 2 days of packed blood cells.  Gastroenterology was consulted and underwent an EGD on 1/1 with 2 gastric AVMs with intermittent oozing, status post APC.  GI recommended potential resumption of anticoagulation on 1/4, as well as PPI BID for 4 weeks followed by daily indefinitely.  Overall eventually improved, however had worsening hypoxia and imaging showed a loculated moderate right pleural effusion.  He had a chest tube placed which is now being managed with the assistance of pulmonology.  Subjective / 24h Interval events: Remains anxious, tapping with his right finger against the bed, constantly spitting.  Has been having poor p.o. intake and poor medication intake with his constant spitting  Assesement and Plan: Principal Problem:   Gastrointestinal hemorrhage with melena Active Problems:   Type 2 diabetes mellitus (HCC)   Hyperlipidemia   Essential hypertension   Coronary artery disease due to lipid rich plaque   Paroxysmal atrial fibrillation (HCC)   Pulmonary hypertension (HCC)   Symptomatic anemia   ABLA (acute blood loss anemia)   Protein-calorie malnutrition, severe (HCC)   Thrombocytosis   Hyponatremia   AKI (acute kidney injury) (HCC)   CKD stage 3a, GFR 45-59 ml/min (HCC)   Hemothorax   Pneumothorax   Hemothorax on right   Pleural effusion   Palliative care encounter   Goals of care, counseling/discussion   Counseling  and coordination of care   Need for emotional support   UGIB (upper gastrointestinal bleed)   Odynophagia   Medication management   Principal problem Right-sided hemothorax, loculated pleural effusion -pulmonary consulted and following.  Had a chest tube initially 1/1, no significant output, eventually had a CT-guided chest tube placement by IR on 1/6 after discussing case with cardiothoracic surgery.  Pulmonary following, status post lytic treatment with last dose 1/13.  Initial thoracentesis showed dark bloody stools, cultures are negative, fluid was found to have exudative, cytology was negative.  He was initially placed on Zosyn, completed a 7-day course -Chest tube removed 10/14/2022, had a fever last night, repeat chest x-ray -Continue to keep on heparin while mental status is not improving and he is not febrile  Active problems Symptomatic acute blood loss anemia due to upper GI bleed -patient was on Pradaxa on admission for a flutter.  He presented with melena for over 2 weeks, symptomatic.  Status post vitamin K as well as Praxbind on 12/28 and 12/30, also received FFP and 2 units of packed red blood cells.  GI consulted and underwent an EGD, found to have AVM's status post APC.  Clinically without further bleeding.  Per GI, anticoagulation could have been resumed on 1/4 -Continue heparin, no further bleeding  Fever-last night, obtain chest x-ray, urinalysis, cultures.  Low threshold to rescan his chest with a CT  Acute hypoxic respiratory failure-currently on room air  ICU induced delirium, sundowning, generalized debility -seems to be waxing and waning, prior to this wife denies any memory issues, he was fully  functional. -Still without significant improvement, he is developing tics with right finger scratching against the bed, spitting every 10 to 15 seconds, CT scan of the head and EEG pending, neurology consulted  Acute kidney injury on CKD 3A-baseline creatinine around 1.4, acute  kidney injury likely in the setting of acute illness.  Function is improved  DM2-A1c 5.5.  Essential hypertension-starting to be hypertensive, initially Coreg and Imdur were on hold due to hypotension, was briefly placed on midodrine which has now been discontinued.  Continue Coreg, blood pressure is acceptable  PAF, paroxysmal SVT -continue Coreg today as above, Pradaxa on hold, started on anticoagulation 1/15 with heparin drip  Epistaxis-chronic issue from Pradaxa, no true epistaxis but does have intranasal blood clots from constant picking  Gross hematuria-due to coagulopathy, resolved.  Prior hospitalist discussed with Dr. Annabell Howells on 12/30, outpatient follow-up.  Watch now that he is back on heparin  Thrombocytopenia -thrombocytopenia resolved   Non-anion gap metabolic acidosis - Resolved.   Hyponatremia - Resolved.   Hypokalemia  -Monitor replenish as appropriate.   Hyperlipidemia -Continue rosuvastatin    Elevated INR - 9.5 on 12/28, S/p Praxbind, vitamin K and FFP.   Morbid obesity  - Body mass index is 36.91 kg/m.  Scheduled Meds:  carvedilol  6.25 mg Oral BID WC   Chlorhexidine Gluconate Cloth  6 each Topical Daily   lactulose  20 g Oral BID   pantoprazole (PROTONIX) IV  40 mg Intravenous Q12H   sodium chloride flush  10 mL Intravenous Q12H   sodium chloride flush  10 mL Intrapleural Q8H   sodium chloride flush  10 mL Intrapleural Q8H   sucralfate  1 g Oral TID WC & HS   tamsulosin  0.4 mg Oral Daily   Continuous Infusions:  heparin 1,350 Units/hr (10/18/23 0419)   potassium PHOSPHATE IVPB (in mmol) 30 mmol (10/18/23 0952)   PRN Meds:.acetaminophen **OR** acetaminophen, alum & mag hydroxide-simeth, hydrALAZINE, hydrocortisone cream, hydrOXYzine, lidocaine, metoprolol tartrate, ondansetron **OR** ondansetron (ZOFRAN) IV, mouth rinse, sodium chloride, sodium chloride flush  Current Outpatient Medications  Medication Instructions   carvedilol (COREG) 12.5 mg, Oral, 2  times daily   fenofibrate 160 mg, Oral, Daily   ipratropium (ATROVENT) 0.06 % nasal spray 1 spray, Each Nare, 3 times daily PRN   isosorbide mononitrate (IMDUR) 30 mg, Oral, Daily   nitroGLYCERIN (NITROSTAT) 0.4 mg, Sublingual, Every 5 min x3 PRN   olmesartan (BENICAR) 40 mg, Oral, Daily   pioglitazone (ACTOS) 15 mg, Oral, Daily   Pradaxa 150 mg, Oral, 2 times daily   rosuvastatin (CRESTOR) 20 mg, Oral, Daily   Tylenol 8 Hour Arthritis Pain 650-1,300 mg, Oral, Every 8 hours PRN   vitamin B-12 (CYANOCOBALAMIN) 500 mcg, Oral, Daily    Diet Orders (From admission, onward)     Start     Ordered   10/05/23 0831  Diet Heart Room service appropriate? Yes; Fluid consistency: Thin  Diet effective now       Question Answer Comment  Room service appropriate? Yes   Fluid consistency: Thin      10/05/23 0830            DVT prophylaxis: SCDs Start: 09/24/23 1503   Lab Results  Component Value Date   PLT 168 10/18/2023      Code Status: Limited: Do not attempt resuscitation (DNR) -DNR-LIMITED -Do Not Intubate/DNI   Family Communication: Wife present at bedside  Status is: Inpatient Remains inpatient appropriate because: severity of illness  Level of care:  Telemetry  Consultants:  Pulmonary GI  Objective: Vitals:   10/17/23 2108 10/18/23 0151 10/18/23 0219 10/18/23 0518  BP: (!) 148/102   (!) 147/58  Pulse: (!) 102   66  Resp: 18   14  Temp: 99.5 F (37.5 C) 100 F (37.8 C) 98.6 F (37 C) 98.5 F (36.9 C)  TempSrc: Oral Axillary Axillary Oral  SpO2: 93%   94%  Weight:      Height:        Intake/Output Summary (Last 24 hours) at 10/18/2023 1049 Last data filed at 10/18/2023 0830 Gross per 24 hour  Intake 0 ml  Output 850 ml  Net -850 ml   Wt Readings from Last 3 Encounters:  10/17/23 100.2 kg  04/27/23 115.2 kg  03/18/23 119.9 kg    Examination: Constitutional: NAD Eyes: lids and conjunctivae normal, no scleral icterus ENMT: mmm Neck: normal,  supple Respiratory: clear to auscultation bilaterally, no wheezing, no crackles. Normal respiratory effort.  Cardiovascular: Regular rate and rhythm, no murmurs / rubs / gallops. No LE edema. Abdomen: soft, no distention, no tenderness. Bowel sounds positive.   Data Reviewed: I have independently reviewed following labs and imaging studies   CBC Recent Labs  Lab 10/14/23 0258 10/15/23 0333 10/16/23 0810 10/17/23 0407 10/18/23 0258  WBC 3.7* 4.1 5.5 4.5 5.8  HGB 8.6* 8.6* 8.9* 8.2* 8.4*  HCT 27.7* 28.2* 28.7* 26.2* 27.2*  PLT 167 168 170 152 168  MCV 96.5 98.6 96.3 97.0 96.8  MCH 30.0 30.1 29.9 30.4 29.9  MCHC 31.0 30.5 31.0 31.3 30.9  RDW 16.5* 16.7* 16.6* 17.0* 17.2*    Recent Labs  Lab 10/12/23 0258 10/13/23 0304 10/14/23 0258 10/15/23 0333 10/17/23 0407 10/18/23 0258  NA 137 140 139 141 140 142  K 3.6 3.6 3.5 3.5 2.8* 3.0*  CL 106 108 108 109 109 109  CO2 23 22 21* 19* 22 21*  GLUCOSE 96 81 80 73 109* 93  BUN 25* 22 20 16 15 14   CREATININE 1.73* 1.37* 1.25* 1.21 1.16 1.18  CALCIUM 8.0* 8.3* 8.5* 8.6* 8.4* 8.3*  AST  --   --  26  --  19  --   ALT  --   --  10  --  9  --   ALKPHOS  --   --  39  --  44  --   BILITOT  --   --  0.9  --  0.8  --   ALBUMIN 2.6* 2.6* 2.8*  --  2.6*  --   MG 1.9 1.9 1.9  --  1.7 1.5*  TSH 8.248*  --   --   --   --   --     ------------------------------------------------------------------------------------------------------------------ No results for input(s): "CHOL", "HDL", "LDLCALC", "TRIG", "CHOLHDL", "LDLDIRECT" in the last 72 hours.  Lab Results  Component Value Date   HGBA1C 5.5 09/24/2023   ------------------------------------------------------------------------------------------------------------------ No results for input(s): "TSH", "T4TOTAL", "T3FREE", "THYROIDAB" in the last 72 hours.  Invalid input(s): "FREET3"   Cardiac Enzymes No results for input(s): "CKMB", "TROPONINI", "MYOGLOBIN" in the last 168  hours.  Invalid input(s): "CK" ------------------------------------------------------------------------------------------------------------------    Component Value Date/Time   BNP 295.2 (H) 09/27/2023 1614    CBG: No results for input(s): "GLUCAP" in the last 168 hours.  Recent Results (from the past 240 hours)  Culture, blood (Routine X 2) w Reflex to ID Panel     Status: None (Preliminary result)   Collection Time: 10/18/23  8:12 AM  Specimen: BLOOD RIGHT HAND  Result Value Ref Range Status   Specimen Description   Final    BLOOD RIGHT HAND Performed at [facility], [address] [place], [place] [address]    Special Requests   Final    BOTTLES DRAWN AEROBIC ONLY Blood Culture results may not be optimal due to an inadequate volume of blood received in culture bottles Performed at [facility], [address] [place], [place] [address]    Culture   Final    NO GROWTH <12 HOURS Performed at [facility], [address] [place], [place] [address]    Report Status PENDING  Incomplete  Culture, blood (Routine X 2) w Reflex to ID Panel     Status: None (Preliminary result)   Collection Time: 10/18/23  8:17 AM   Specimen: BLOOD LEFT HAND  Result Value Ref Range Status   Specimen Description   Final    BLOOD LEFT HAND Performed at [facility], [address] [place], [place] [address]    Special Requests   Final    BOTTLES DRAWN AEROBIC ONLY Blood Culture results may not be optimal due to an inadequate volume of blood received in culture bottles Performed at [facility], [address], [place], [place] [address]    Culture   Final    NO GROWTH <12 HOURS Performed at [facility], [address] [place], [place] [address]    Report Status PENDING  Incomplete      Radiology Studies: No results found.    Pamella Pert, MD, PhD [facility]  Between 7 am - 7 pm I am available, please  contact me via Amion (for emergencies) or Securechat (non urgent messages)  Between 7 pm - 7 am I am not available, please contact night coverage MD/APP via Amion

## 2023-10-18 NOTE — Procedures (Signed)
Patient Name: Joshua [name]  MRN: [id number]  Epilepsy Attending: Charlsie Quest  Referring Physician/Provider: Leatha Gilding, MD  Date: 10/18/2023 Duration: 25.28 mins  Patient history: 73yo M with ams getting eeg to evaluate for seizure  Level of alertness: Awake, asleep  AEDs during EEG study: None  Technical aspects: This EEG study was done with scalp electrodes positioned according to the 10-20 International system of electrode placement. Electrical activity was reviewed with band pass filter of 1-70Hz , sensitivity of 7 uV/mm, display speed of 80mm/sec with a 60Hz  notched filter applied as appropriate. EEG data were recorded continuously and digitally stored.  Video monitoring was available and reviewed as appropriate.  Description: The posterior dominant rhythm consists of 8 Hz activity of moderate voltage (25-35 uV) seen predominantly in posterior head regions, symmetric and reactive to eye opening and eye closing. Sleep was characterized by vertex waves, sleep spindles (12 to 14 Hz), maximal frontocentral region. Physiologic photic driving was not seen during photic stimulation. Hyperventilation was not performed.     IMPRESSION: This study is within normal limits. No seizures or epileptiform discharges were seen throughout the recording.  A normal interictal EEG does not exclude the diagnosis of epilepsy.  Aarvi Stotts Annabelle Harman

## 2023-10-19 DIAGNOSIS — K921 Melena: Secondary | ICD-10-CM | POA: Diagnosis not present

## 2023-10-19 DIAGNOSIS — J942 Hemothorax: Secondary | ICD-10-CM | POA: Diagnosis not present

## 2023-10-19 DIAGNOSIS — Z515 Encounter for palliative care: Secondary | ICD-10-CM | POA: Diagnosis not present

## 2023-10-19 DIAGNOSIS — Z7189 Other specified counseling: Secondary | ICD-10-CM | POA: Diagnosis not present

## 2023-10-19 LAB — CBC
HCT: 26.9 % — ABNORMAL LOW (ref 39.0–52.0)
Hemoglobin: 8.4 g/dL — ABNORMAL LOW (ref 13.0–17.0)
MCH: 29.8 pg (ref 26.0–34.0)
MCHC: 31.2 g/dL (ref 30.0–36.0)
MCV: 95.4 fL (ref 80.0–100.0)
Platelets: 159 10*3/uL (ref 150–400)
RBC: 2.82 MIL/uL — ABNORMAL LOW (ref 4.22–5.81)
RDW: 17.2 % — ABNORMAL HIGH (ref 11.5–15.5)
WBC: 5.1 10*3/uL (ref 4.0–10.5)
nRBC: 0.4 % — ABNORMAL HIGH (ref 0.0–0.2)

## 2023-10-19 LAB — BASIC METABOLIC PANEL
Anion gap: 10 (ref 5–15)
BUN: 19 mg/dL (ref 8–23)
CO2: 19 mmol/L — ABNORMAL LOW (ref 22–32)
Calcium: 7.5 mg/dL — ABNORMAL LOW (ref 8.9–10.3)
Chloride: 108 mmol/L (ref 98–111)
Creatinine, Ser: 1.31 mg/dL — ABNORMAL HIGH (ref 0.61–1.24)
GFR, Estimated: 57 mL/min — ABNORMAL LOW (ref >60)
Glucose, Bld: 119 mg/dL — ABNORMAL HIGH (ref 70–99)
Potassium: 2.9 mmol/L — ABNORMAL LOW (ref 3.5–5.1)
Sodium: 137 mmol/L (ref 135–145)

## 2023-10-19 LAB — HEPARIN LEVEL (UNFRACTIONATED): Heparin Unfractionated: 0.28 [IU]/mL — ABNORMAL LOW (ref 0.30–0.70)

## 2023-10-19 LAB — RPR: RPR Ser Ql: NONREACTIVE

## 2023-10-19 LAB — PHOSPHORUS: Phosphorus: 3.4 mg/dL (ref 2.5–4.6)

## 2023-10-19 LAB — MAGNESIUM: Magnesium: 1.7 mg/dL (ref 1.7–2.4)

## 2023-10-19 MED ORDER — CARVEDILOL 12.5 MG PO TABS
12.5000 mg | ORAL_TABLET | Freq: Two times a day (BID) | ORAL | Status: DC
Start: 2023-10-19 — End: 2023-10-24
  Administered 2023-10-19 – 2023-10-24 (×10): 12.5 mg via ORAL
  Filled 2023-10-19 (×10): qty 1

## 2023-10-19 MED ORDER — ISOSORBIDE MONONITRATE ER 30 MG PO TB24
30.0000 mg | ORAL_TABLET | Freq: Every day | ORAL | Status: DC
  Administered 2023-10-19 – 2023-10-24 (×6): 30 mg via ORAL
  Filled 2023-10-19 (×6): qty 1

## 2023-10-19 MED ORDER — ROSUVASTATIN CALCIUM 20 MG PO TABS
20.0000 mg | ORAL_TABLET | Freq: Every day | ORAL | Status: DC
  Administered 2023-10-19 – 2023-10-24 (×6): 20 mg via ORAL
  Filled 2023-10-19 (×6): qty 1

## 2023-10-19 MED ORDER — DABIGATRAN ETEXILATE MESYLATE 75 MG PO CAPS
150.0000 mg | ORAL_CAPSULE | Freq: Two times a day (BID) | ORAL | Status: DC
  Filled 2023-10-19: qty 1
  Filled 2023-10-19: qty 2

## 2023-10-19 MED ORDER — POTASSIUM CHLORIDE 20 MEQ PO PACK
40.0000 meq | PACK | ORAL | Status: AC
  Administered 2023-10-19 (×2): 40 meq via ORAL
  Filled 2023-10-19 (×2): qty 2

## 2023-10-19 MED ORDER — DABIGATRAN ETEXILATE MESYLATE 75 MG PO CAPS
150.0000 mg | ORAL_CAPSULE | Freq: Two times a day (BID) | ORAL | Status: DC
  Administered 2023-10-19 – 2023-10-24 (×11): 150 mg via ORAL
  Filled 2023-10-19 (×8): qty 2
  Filled 2023-10-19: qty 1
  Filled 2023-10-19 (×3): qty 2

## 2023-10-19 NOTE — TOC Progression Note (Signed)
Transition of Care [facility]) - Progression Note    Patient Details  Name: Joshua [name] MRN: [id number] Date of Birth: 01-Oct-1950  Transition of Care [facility]) CM/SW Contact  Larrie Kass, LCSW Phone Number: 10/19/2023, 2:18 PM  Clinical Narrative:     Noted pt rec for AIR, CSW sent message to CIR team to review for possible placement. TOC to follow.   Expected Discharge Plan: Home/Self Care Barriers to Discharge: No Barriers Identified  Expected Discharge Plan and Services       Living arrangements for the past 2 months: Single Family Home                                       Social Determinants of Health (SDOH) Interventions SDOH Screenings   Food Insecurity: No Food Insecurity (09/24/2023)  Housing: Low Risk  (09/25/2023)  Transportation Needs: No Transportation Needs (09/24/2023)  Utilities: Not At Risk (09/24/2023)  Social Connections: Patient Unable To Answer (09/27/2023)  Tobacco Use: Medium Risk (09/30/2023)    Readmission Risk Interventions    09/27/2023   10:04 AM  Readmission Risk Prevention Plan  Transportation Screening Complete  PCP or Specialist Appt within 5-7 Days Complete  Home Care Screening Complete  Medication Review (RN CM) Complete

## 2023-10-19 NOTE — Progress Notes (Signed)
PHARMACY - ANTICOAGULATION CONSULT NOTE  Pharmacy Consult for heparin Indication: atrial fibrillation  Allergies  Allergen Reactions   Desipramine Hives and Itching   Niacin Itching    Patient Measurements: Height: 5\' 8"  (172.7 cm) Weight: 100.2 kg (221 lb) IBW/kg (Calculated) : 68.4 Heparin Dosing Weight: 89 kg  Vital Signs: Temp: 98.4 F (36.9 C) (01/20 2100) Temp Source: Oral (01/20 2100) BP: 142/75 (01/20 2100) Pulse Rate: 63 (01/20 2100)  Labs: Recent Labs    10/17/23 0407 10/18/23 0258 10/18/23 1358 10/19/23 0045  HGB 8.2* 8.4*  --  8.4*  HCT 26.2* 27.2*  --  26.9*  PLT 152 168  --  159  HEPARINUNFRC 0.38 0.21* 0.25* 0.28*  CREATININE 1.16 1.18  --  1.31*    Estimated Creatinine Clearance: 56.7 mL/min (A) (by C-G formula based on SCr of 1.31 mg/dL (H)).  Assessment: 68 yoM presented 12/27 with melena and weakness. PMH AFib/Aflutter on Pradaxa. Found to have UGIB and was given vitamin K in addition to Praxbind x 2, FFP and PRBCs. EGD revealed 2 gastric AVMs with intermittent oozing s/p APC. Initially improved but then developed worsening hypoxia and imaging showed loculated moderate right pleural effusion. Chest tube was placed and is now s/p 3 doses of intrapleural lytics, chest tube removed on 1/17. On 1/15, patient was deemed safe to resume anticoagulation, and Pharmacy consulted to manage heparin for AFib.   10/19/2023: HL 0.28 subtherapeutic on 1450 units/hr CBC: Hgb low at 8.4 but stable; Plt WNL  SCr stable WNL No bleeding or infusion issues per RN noted  Goal of Therapy:  Heparin level 0.3-0.7 units/ml Monitor platelets by anticoagulation protocol: Yes   Plan:  Increase heparin infusion to 1550 units/hr  Check heparin level 8 hr after rate increase Daily heparin level and CBC while on heparin infusion Follow up transition to oral anticoagulation   Arley Phenix RPh 10/19/2023, 1:32 AM

## 2023-10-19 NOTE — Progress Notes (Signed)
Inpatient Rehab Admissions Coordinator:    I spoke with Pt. And wife over the phone regarding potential CIR admit. They are adamant that they want to go home even if he requires a 2 person assist to stand and get out of bed, which he currently does. Wife states she can be with him and that they have neighbors who can assist with all transfers. I will follow for another day in case they change their mind and will let TOC know that they prefer home.   Megan Salon, MS, CCC-SLP Rehab Admissions Coordinator  [phone] (celll) [phone] (office)

## 2023-10-19 NOTE — Progress Notes (Signed)
Inpatient Rehab Admissions Coordinator Note:   Per updated therapy recs patient was screened for CIR candidacy by Stephania Fragmin, PT. At this time, pt appears to be a potential candidate for CIR. I will place an order for rehab consult for full assessment, per our protocol.  Please contact me any with questions.Estill Dooms, PT, DPT [phone] 10/19/23 2:32 PM

## 2023-10-19 NOTE — Progress Notes (Signed)
PHARMACY - ANTICOAGULATION CONSULT NOTE  Pharmacy Consult for heparin >> Pradaxa  Indication: atrial fibrillation  Allergies  Allergen Reactions   Desipramine Hives and Itching   Niacin Itching    Patient Measurements: Height: 5\' 8"  (172.7 cm) Weight: 103 kg (227 lb 1.2 oz) IBW/kg (Calculated) : 68.4 Heparin Dosing Weight: 89 kg  Vital Signs: Temp: 98.8 F (37.1 C) (01/21 0603) Temp Source: Oral (01/21 0603) BP: 157/89 (01/21 0603) Pulse Rate: 79 (01/21 0603)  Labs: Recent Labs    10/17/23 0407 10/18/23 0258 10/18/23 1358 10/19/23 0045  HGB 8.2* 8.4*  --  8.4*  HCT 26.2* 27.2*  --  26.9*  PLT 152 168  --  159  HEPARINUNFRC 0.38 0.21* 0.25* 0.28*  CREATININE 1.16 1.18  --  1.31*    Estimated Creatinine Clearance: 57.5 mL/min (A) (by C-G formula based on SCr of 1.31 mg/dL (H)).  Assessment: 66 yoM presented 12/27 with melena and weakness. PMH AFib/Aflutter on Pradaxa. Found to have UGIB and was given vitamin K in addition to Praxbind x 2, FFP and PRBCs. EGD revealed 2 gastric AVMs with intermittent oozing s/p APC. Initially improved but then developed worsening hypoxia and imaging showed loculated moderate right pleural effusion. Chest tube was placed and is now s/p 3 doses of intrapleural lytics, chest tube removed on 1/17. On 1/15, patient was deemed safe to resume anticoagulation, and Pharmacy consulted to manage heparin for AFib.   On 1/21, pharmacy is consulted to resume Pradaxa    10/19/2023: Discussed with MD previous bleeding during admission. Felt that bleeding was more likely related to the AVM see by GI  Hgb 8.4, plt 159  Scr 1.31 mg/dl, CRcl 58 ml/min   Goal of Therapy:  Heparin level 0.3-0.7 units/ml Monitor platelets by anticoagulation protocol: Yes   Plan:  Stop heparin drip and restart Pradaxa 150 mg BID  Monitor CBC, Scr  Monitor for signs and symptoms of bleeding    Arley Phenix RPh 10/19/2023, 10:33 AM

## 2023-10-19 NOTE — Progress Notes (Signed)
PMT no charge note.   Patient participating with PT, wife at bedside. Chart reviewed, EEG done.  Monitor hospital course, PMT follows peripherally. No new inpatient PMT specific interventions at this time.  No charge Rosalin Hawking MD [place] [facility].

## 2023-10-19 NOTE — Progress Notes (Deleted)
   10/19/23 1420  What Happened  Was fall witnessed? Yes  Who witnessed fall? Patric Dykes, NT  Patients activity before fall to/from bed, chair, or stretcher (from chair to [facility])  Point of contact buttocks (assisted fall, lowered to floor)  Was patient injured? No  Provider Notification  Provider Name/Title Pamella Pert, MD  Date Provider Notified 10/19/23  Time Provider Notified 1430  Method of Notification Page  Notification Reason Fall  Provider response No new orders  Date of Provider Response 10/19/23  Time of Provider Response 1430  Follow Up  Family notified Yes - comment (Patient wife assisting both NTs when patient had an assisted fall in room)  Time family notified 1400  Additional tests No  Simple treatment Other (comment) (assessed for any wounds or injuries. None noted. patient denied pain)  Progress note created (see row info) Yes  Adult Fall Risk Assessment  Risk Factor Category (scoring not indicated) High fall risk per protocol (document High fall risk)  Adult Fall Risk Interventions  Additional Interventions Use of appropriate toileting equipment (bedpan, BSC, etc.);Room near nurses station;PT/OT need assessed if change in mobility from baseline  Fall intervention(s) refused/Patient educated regarding refusal Bed alarm;Nonskid socks;Yellow bracelet;Supervision while toileting/edge of bed sitting  Screening for Fall Injury Risk (To be completed on HIGH fall risk patients) - Assessing Need for Floor Mats  Risk For Fall Injury- Criteria for Floor Mats Bleeding risk-anticoagulation (not prophylaxis)  Will Implement Floor Mats Yes  Vitals  Temp 98 F (36.7 C)  Temp Source Oral  BP 128/71  MAP (mmHg) 90  Pulse Rate 94  Oxygen Therapy  SpO2 97 %  O2 Device Room Air  Pain Assessment  Pain Scale 0-10  Pain Score 0

## 2023-10-19 NOTE — Progress Notes (Signed)
PROGRESS NOTE  Joshua [name] YNW:[id number] DOB: 08/19/1951 DOA: 09/24/2023 PCP: [name], MD   LOS: 25 days   Brief Narrative / Interim history: 73 year old male with history of PAF/flutter on Pradaxa, CAD status post stents 2009, DM2, CKD 3A, OSA not on CPAP, HTN, HLD comes into the hospital with weakness, dizziness, dyspnea, melena and lower extremity swelling for several weeks.  He was found to have a GI bleed, acute kidney injury with uremia and hyponatremia.  His anticoagulation was reversed, was given vitamin K, received FFP as well as 2 days of packed blood cells.  Gastroenterology was consulted and underwent an EGD on 1/1 with 2 gastric AVMs with intermittent oozing, status post APC.  GI recommended potential resumption of anticoagulation on 1/4, as well as PPI BID for 4 weeks followed by daily indefinitely.  Overall eventually improved, however had worsening hypoxia and imaging showed a loculated moderate right pleural effusion.  He had a chest tube placed, and eventually removed.  Hospital course complicated by persistent encephalopathy/delirium, but eventually improving  Subjective / 24h Interval events: He seems much improved today, more conversant and less confused.  Assesement and Plan: Principal Problem:   Gastrointestinal hemorrhage with melena Active Problems:   Type 2 diabetes mellitus (HCC)   Hyperlipidemia   Essential hypertension   Coronary artery disease due to lipid rich plaque   Paroxysmal atrial fibrillation (HCC)   Pulmonary hypertension (HCC)   Symptomatic anemia   ABLA (acute blood loss anemia)   Protein-calorie malnutrition, severe (HCC)   Thrombocytosis   Hyponatremia   AKI (acute kidney injury) (HCC)   CKD stage 3a, GFR 45-59 ml/min (HCC)   Hemothorax   Pneumothorax   Hemothorax on right   Pleural effusion   Palliative care encounter   Goals of care, counseling/discussion   Counseling and coordination of care   Need for emotional  support   UGIB (upper gastrointestinal bleed)   Odynophagia   Medication management   Principal problem Right-sided hemothorax, loculated pleural effusion -pulmonary consulted and following.  Had a chest tube initially 1/1, no significant output, eventually had a CT-guided chest tube placement by IR on 1/6 after discussing case with cardiothoracic surgery.  Pulmonary consulted and followed patient, status post lytic treatment, eventually chest tube was removed 1/17.  Cultures were negative and he has completed a 7-day course with antibiotics -Had a fever 1/19, workup essentially unremarkable and now afebrile  Active problems Symptomatic acute blood loss anemia due to upper GI bleed -patient was on Pradaxa on admission for a flutter.  He presented with melena for over 2 weeks, symptomatic.  Status post vitamin K as well as Praxbind on 12/28 and 12/30, also received FFP and 2 units of packed red blood cells.  GI consulted and underwent an EGD, found to have AVM's status post APC.  Clinically without further bleeding.  Per GI, anticoagulation could have been resumed on 1/4 -Was placed on heparin over the weekend, he had no further bleeding, hemoglobin has remained stable, will switch to oral anticoagulants today.  Family expressed some concerns about Pradaxa as to the cause for his bleed, however per prior GI notes and also I have discussed with GI today, patient bleed due to AVMs and clinical course would have been the same with any other NOACs.  Discussed with the the patient and his wife, they are okay with resuming Pradaxa today, since he has been on it for several years and has been doing well  Fever-x 1  on 1/19, cultures negative.  Afebrile now  Acute hypoxic respiratory failure-currently on room air  ICU induced delirium, sundowning, generalized debility -seems to be waxing and waning, prior to this wife denies any memory issues, he was fully functional.  Patient was a very very slow  improvement, developed tics with right finger stretching against the bed, spitting every 15 seconds, brain imaging with CT scan and EEG fairly unremarkable.  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  Neurology consulted and evaluated patient 1/20, did not felt to be primarily neurologic in origin and signed off. -Finally he is showing some signs of improvement today, he is much more alert and more conversant.  With his confusion improving, TOC consulted to pursue SNF as recommended by PT  Acute kidney injury on CKD 3A-baseline creatinine around 1.4, acute kidney injury likely in the setting of acute illness.  Renal function is stable and at baseline today  DM2-A1c 5.5.  Essential hypertension-starting to be hypertensive, initially Coreg and Imdur were on hold due to hypotension, was briefly placed on midodrine which has now been discontinued.  Continue Coreg and increase to home dose today, added Imdur back today as well  PAF, paroxysmal SVT -continue Coreg today as above, resume Pradaxa  Epistaxis-chronic issue from Pradaxa, no true epistaxis but does have intranasal blood clots from constant picking  Gross hematuria-due to coagulopathy, resolved.  Prior hospitalist discussed with Dr. Annabell Howells on 12/30, outpatient follow-up.  Watch now that he is back on Pradaxa  Thrombocytopenia -thrombocytopenia resolved   Non-anion gap metabolic acidosis - Resolved.   Hyponatremia - Resolved.   Hypokalemia  -Monitor replenish as appropriate.   Hyperlipidemia -Continue rosuvastatin    Elevated INR - 9.5 on 12/28, S/p Praxbind, vitamin K and FFP.  I doubt utility of having had this checked as NOACs can alter INR levels and not necessarily correlating with the degree of blood thinning   Morbid obesity  - Body mass index is 36.91 kg/m.  Scheduled Meds:  carvedilol  12.5 mg Oral BID WC   Chlorhexidine Gluconate Cloth  6 each Topical Daily   isosorbide mononitrate  30 mg Oral  Daily   pantoprazole (PROTONIX) IV  40 mg Intravenous Q12H   rosuvastatin  20 mg Oral Daily   sodium chloride flush  10 mL Intravenous Q12H   tamsulosin  0.4 mg Oral Daily   Continuous Infusions:   PRN Meds:.acetaminophen **OR** acetaminophen, alum & mag hydroxide-simeth, hydrALAZINE, hydrocortisone cream, hydrOXYzine, lidocaine, metoprolol tartrate, ondansetron **OR** ondansetron (ZOFRAN) IV, mouth rinse, sodium chloride, sodium chloride flush  Current Outpatient Medications  Medication Instructions   carvedilol (COREG) 12.5 mg, Oral, 2 times daily   fenofibrate 160 mg, Oral, Daily   ipratropium (ATROVENT) 0.06 % nasal spray 1 spray, Each Nare, 3 times daily PRN   isosorbide mononitrate (IMDUR) 30 mg, Oral, Daily   nitroGLYCERIN (NITROSTAT) 0.4 mg, Sublingual, Every 5 min x3 PRN   olmesartan (BENICAR) 40 mg, Oral, Daily   pioglitazone (ACTOS) 15 mg, Oral, Daily   Pradaxa 150 mg, Oral, 2 times daily   rosuvastatin (CRESTOR) 20 mg, Oral, Daily   Tylenol 8 Hour Arthritis Pain 650-1,300 mg, Oral, Every 8 hours PRN   vitamin B-12 (CYANOCOBALAMIN) 500 mcg, Oral, Daily    Diet Orders (From admission, onward)     Start     Ordered   10/05/23 0831  Diet Heart Room service appropriate? Yes; Fluid consistency: Thin  Diet effective now  Question Answer Comment  Room service appropriate? Yes   Fluid consistency: Thin      10/05/23 0830            DVT prophylaxis: SCDs Start: 09/24/23 1503   Lab Results  Component Value Date   PLT 159 10/19/2023      Code Status: Limited: Do not attempt resuscitation (DNR) -DNR-LIMITED -Do Not Intubate/DNI   Family Communication: Wife present at bedside  Status is: Inpatient Remains inpatient appropriate because: severity of illness  Level of care: Telemetry  Consultants:  Pulmonary GI  Objective: Vitals:   10/18/23 1245 10/18/23 2100 10/19/23 0500 10/19/23 0603  BP: (!) 137/56 (!) 142/75  (!) 157/89  Pulse: 63 63  79   Resp: 18 16  18   Temp: 98.8 F (37.1 C) 98.4 F (36.9 C)  98.8 F (37.1 C)  TempSrc: Oral Oral  Oral  SpO2: 95% 95%  96%  Weight:   103 kg   Height:        Intake/Output Summary (Last 24 hours) at 10/19/2023 1302 Last data filed at 10/19/2023 0600 Gross per 24 hour  Intake 1607.49 ml  Output 500 ml  Net 1107.49 ml   Wt Readings from Last 3 Encounters:  10/19/23 103 kg  04/27/23 115.2 kg  03/18/23 119.9 kg    Examination: Constitutional: NAD Eyes: lids and conjunctivae normal, no scleral icterus ENMT: mmm Neck: normal, supple Respiratory: clear to auscultation bilaterally, no wheezing, no crackles. Normal respiratory effort.  Cardiovascular: Regular rate and rhythm, no murmurs / rubs / gallops. No LE edema. Abdomen: soft, no distention, no tenderness. Bowel sounds positive.   Data Reviewed: I have independently reviewed following labs and imaging studies   CBC Recent Labs  Lab 10/15/23 0333 10/16/23 0810 10/17/23 0407 10/18/23 0258 10/19/23 0045  WBC 4.1 5.5 4.5 5.8 5.1  HGB 8.6* 8.9* 8.2* 8.4* 8.4*  HCT 28.2* 28.7* 26.2* 27.2* 26.9*  PLT 168 170 152 168 159  MCV 98.6 96.3 97.0 96.8 95.4  MCH 30.1 29.9 30.4 29.9 29.8  MCHC 30.5 31.0 31.3 30.9 31.2  RDW 16.7* 16.6* 17.0* 17.2* 17.2*    Recent Labs  Lab 10/13/23 0304 10/14/23 0258 10/15/23 0333 10/17/23 0407 10/18/23 0258 10/19/23 0045  NA 140 139 141 140 142 137  K 3.6 3.5 3.5 2.8* 3.0* 2.9*  CL 108 108 109 109 109 108  CO2 22 21* 19* 22 21* 19*  GLUCOSE 81 80 73 109* 93 119*  BUN 22 20 16 15 14 19   CREATININE 1.37* 1.25* 1.21 1.16 1.18 1.31*  CALCIUM 8.3* 8.5* 8.6* 8.4* 8.3* 7.5*  AST  --  26  --  19  --   --   ALT  --  10  --  9  --   --   ALKPHOS  --  39  --  44  --   --   BILITOT  --  0.9  --  0.8  --   --   ALBUMIN 2.6* 2.8*  --  2.6*  --   --   MG 1.9 1.9  --  1.7 1.5* 1.7     ------------------------------------------------------------------------------------------------------------------ No results for input(s): "CHOL", "HDL", "LDLCALC", "TRIG", "CHOLHDL", "LDLDIRECT" in the last 72 hours.  Lab Results  Component Value Date   HGBA1C 5.5 09/24/2023   ------------------------------------------------------------------------------------------------------------------ No results for input(s): "TSH", "T4TOTAL", "T3FREE", "THYROIDAB" in the last 72 hours.  Invalid input(s): "FREET3"   Cardiac Enzymes No results for input(s): "CKMB", "TROPONINI", "MYOGLOBIN" in  the last 168 hours.  Invalid input(s): "CK" ------------------------------------------------------------------------------------------------------------------    Component Value Date/Time   BNP 295.2 (H) 09/27/2023 1614    CBG: No results for input(s): "GLUCAP" in the last 168 hours.  Recent Results (from the past 240 hours)  Culture, blood (Routine X 2) w Reflex to ID Panel     Status: None (Preliminary result)   Collection Time: 10/18/23  8:12 AM   Specimen: BLOOD RIGHT HAND  Result Value Ref Range Status   Specimen Description   Final    BLOOD RIGHT HAND Performed at [facility], [address] [place], [place] [address]    Special Requests   Final    BOTTLES DRAWN AEROBIC ONLY Blood Culture results may not be optimal due to an inadequate volume of blood received in culture bottles Performed at [facility], [address] [place], [place] [address]    Culture   Final    NO GROWTH 1 DAY Performed at [facility], [address], [place], [place] [address]    Report Status PENDING  Incomplete  Culture, blood (Routine X 2) w Reflex to ID Panel     Status: None (Preliminary result)   Collection Time: 10/18/23  8:17 AM   Specimen: BLOOD LEFT HAND  Result Value Ref Range Status   Specimen Description   Final    BLOOD LEFT HAND Performed at [facility], [address], [place], [place] [address]    Special Requests   Final    BOTTLES DRAWN AEROBIC ONLY Blood Culture results may not be optimal due to an inadequate volume of blood received in culture bottles Performed at [facility], [address], [place], [place] [address]    Culture   Final    NO GROWTH 1 DAY Performed at [facility], [address] [place], [place] [address]    Report Status PENDING  Incomplete      Radiology Studies: CT HEAD WO CONTRAST ( ) Result Date: 10/18/2023 CLINICAL DATA:  Initial evaluation for mental status change, unknown cause. EXAM: CT HEAD WITHOUT CONTRAST TECHNIQUE: Contiguous axial images were obtained from the base of the skull through the vertex without intravenous contrast. RADIATION DOSE REDUCTION: This exam was performed according to the departmental dose-optimization program which includes automated exposure control, adjustment of the mA and/or kV according to patient size and/or use of iterative reconstruction technique. COMPARISON:  Prior CT from 10/08/2023. FINDINGS: Brain: Mild age-related cerebral atrophy with chronic small vessel ischemic disease. No acute intracranial hemorrhage. No acute large vessel territory infarct. No mass lesion, midline shift or mass effect. No hydrocephalus or extra-axial fluid collection. Vascular: No abnormal hyperdense vessel. Calcified atherosclerosis present at the skull base. Skull: Scalp soft tissues within normal limits.  Calvarium intact. Sinuses/Orbits: Globes and orbital soft tissues within normal limits. Paranasal sinuses are clear. No mastoid effusion. Other: None. IMPRESSION: 1. No acute intracranial abnormality. 2. Mild age-related cerebral atrophy with chronic small vessel ischemic disease. Electronically Signed   By: Rise Mu M.D.   On: 10/18/2023 20:18      Pamella Pert, MD, PhD [facility]  Between 7 am - 7 pm I am available, please contact  me via Amion (for emergencies) or Securechat (non urgent messages)  Between 7 pm - 7 am I am not available, please contact night coverage MD/APP via Amion

## 2023-10-19 NOTE — Progress Notes (Signed)
   10/19/23 1420  What Happened  Was fall witnessed? Yes  Who witnessed fall? Patric Dykes, NT  Patients activity before fall to/from bed, chair, or stretcher (from chair to [facility])  Point of contact buttocks (assisted fall, lowered to floor)  Was patient injured? No  Provider Notification  Provider Name/Title Pamella Pert, MD  Date Provider Notified 10/19/23  Time Provider Notified 1430  Method of Notification Page  Notification Reason Fall  Provider response No new orders  Date of Provider Response 10/19/23  Time of Provider Response 1430  Follow Up  Family notified Yes - comment (Patient wife assisting both NTs when patient had an assisted fall in room)  Time family notified 1400  Additional tests No  Simple treatment Other (comment) (assessed for any wounds or injuries. None noted. patient denied pain)  Progress note created (see row info) Yes  Adult Fall Risk Assessment  Risk Factor Category (scoring not indicated) High fall risk per protocol (document High fall risk)  Patient Fall Risk Level High fall risk  Adult Fall Risk Interventions  Required Bundle Interventions *See Row Information* High fall risk - low, moderate, and high requirements implemented  Additional Interventions Use of appropriate toileting equipment (bedpan, BSC, etc.);Room near nurses station;PT/OT need assessed if change in mobility from baseline  Fall intervention(s) refused/Patient educated regarding refusal Bed alarm;Nonskid socks;Yellow bracelet;Supervision while toileting/edge of bed sitting  Screening for Fall Injury Risk (To be completed on HIGH fall risk patients) - Assessing Need for Floor Mats  Risk For Fall Injury- Criteria for Floor Mats Bleeding risk-anticoagulation (not prophylaxis)  Will Implement Floor Mats Yes  Vitals  Temp 98 F (36.7 C)  Temp Source Oral  BP 128/71  MAP (mmHg) 90  Pulse Rate 94  Oxygen Therapy  SpO2 97 %  O2 Device Room Air  Pain Assessment  Pain Scale 0-10   Pain Score 0  Neurological  Neuro (WDL) X  Level of Consciousness Alert  Orientation Level Oriented to person;Oriented to place  Cognition Follows commands  Speech Clear  Musculoskeletal  Musculoskeletal (WDL) X  Assistive Device Stedy  Generalized Weakness Yes  Weight Bearing Restrictions Per Provider Order No   Patient had an assisted fall-transferring from chair to [facility]. two nurse techs lowered him to the ground gently, as his leg slid from under him. RN was not present during this assisted fall. the Nurse techs said the wife was trying to assist but got in the way. RN assessed patient at this time, patient denies pain, no new injuries, vitals WNL, MD notified.

## 2023-10-19 NOTE — Progress Notes (Addendum)
Occupational Therapy Treatment Patient Details Name: Joshua [name] MRN: [id number] DOB: January 17, 1951 Today's Date: 10/19/2023   History of present illness Patient is a 73 year old male who presented with SOB, melena and dizziness. Patient was admitted with symptomatic anemia secondary to GI bleed and AKI. Patient has thoracentesis on 12/31. PMH: a fib, CKD III, HTN, HLD, OSA, OA, CPAP.Right-sided hemothorax, loculated pleural effusion - CT-guided chest tube placement by IR on 1/6 after discussing case with cardiothoracic surgery.  Pulmonary following, status post lytic treatment with last dose 1/13. Pt was discharged from therapy services on 10/07/23 per MD request due to medical decline. New therapy orders received due to change in pt's status.   OT comments  Patient was able to participate in functional mobility to bathroom with +2 with RW with increased time and HR is up to 139 bpm. Patient's wife was present and active in patients movements during session. Patients BP was 159/89 mmhg with HR of 71 bpm sitting in recliner after toileting and functional mobility tasks. Patient will benefit from intensive inpatient follow up therapy, >3 hours/day       If plan is discharge home, recommend the following:  Assistance with cooking/housework;A lot of help with bathing/dressing/bathroom;Two people to help with walking and/or transfers;Direct supervision/assist for medications management;Assist for transportation;Direct supervision/assist for financial management;Help with stairs or ramp for entrance   Equipment Recommendations  None recommended by OT       Precautions / Restrictions Precautions Precautions: Fall Restrictions Weight Bearing Restrictions Per Provider Order: No       Mobility Bed Mobility Overal bed mobility: Needs Assistance Bed Mobility: Supine to Sit   Sidelying to sit: Min assist, +2 for safety/equipment, +2 for physical assistance       General bed mobility comments:  with increased time. wife attempting to help patient slide legs off bed while therapist was present waiting to see what patient could do to move forwards.               ADL either performed or assessed with clinical judgement   ADL Overall ADL's : Needs assistance/impaired           Toilet Transfer: +2 for safety/equipment;+2 for physical assistance;Minimal assistance;Ambulation;Rolling walker (2 wheels) Toilet Transfer Details (indicate cue type and reason): to bathroom in room with increased time. wife noted to get in the way while trying to assess vitals and manage lines/leads. patient attempting to vomit upon sitting on commode. nurse made aware. HR up to 139 bpm during session Toileting- Clothing Manipulation and Hygiene: Sit to/from stand;Total assistance                Cognition Arousal: Alert Behavior During Therapy: Flat affect Overall Cognitive Status: Impaired/Different from baseline                   Orientation Level: Person Current Attention Level: Focused         Problem Solving: Slow processing General Comments: patients wife present during session. patient more conversive today and cooperative with therapy.continues to have some delay in responses that was present when first admitted.                   Pertinent Vitals/ Pain       Pain Assessment Pain Assessment: No/denies pain         Frequency  Min 1X/week        Progress Toward Goals  OT Goals(current goals can now be found in the  care plan section)     Acute Rehab OT Goals Patient Stated Goal: to go to bathroom  Plan      Co-evaluation      Reason for Co-Treatment: Complexity of the patient's impairments (multi-system involvement);Necessary to address cognition/behavior during functional activity;To address functional/ADL transfers PT goals addressed during session: Mobility/safety with mobility OT goals addressed during session: ADL's and self-care       AM-PAC OT "6 Clicks" Daily Activity     Outcome Measure   Help from another person eating meals?: A Little Help from another person taking care of personal grooming?: A Lot Help from another person toileting, which includes using toliet, bedpan, or urinal?: A Lot Help from another person bathing (including washing, rinsing, drying)?: A Lot Help from another person to put on and taking off regular upper body clothing?: A Lot Help from another person to put on and taking off regular lower body clothing?: A Lot 6 Click Score: 13    End of Session Equipment Utilized During Treatment: Rolling walker (2 wheels);Gait belt  OT Visit Diagnosis: Unsteadiness on feet (R26.81);Muscle weakness (generalized) (M62.81)   Activity Tolerance Patient limited by fatigue   Patient Left in chair;with call bell/phone within reach;with family/visitor present   Nurse Communication Mobility status        Time: 8295-6213 OT Time Calculation (min): 27 min  Charges: OT General Charges $OT Visit: 1 Visit OT Treatments $Therapeutic Activity: 8-22 mins  Rosalio Loud, MS Acute Rehabilitation Department Office# [phone]   Selinda Flavin 10/19/2023, 3:47 PM

## 2023-10-19 NOTE — Progress Notes (Signed)
Physical Therapy Treatment Patient Details Name: Joshua [name] MRN: [id number] DOB: 19-Jan-1951 Today's Date: 10/19/2023   History of Present Illness Patient is a 73 year old male who presented with SOB, melena and dizziness. Patient was admitted with symptomatic anemia secondary to GI bleed and AKI. Patient has thoracentesis on 12/31. PMH: a fib, CKD III, HTN, HLD, OSA, OA, CPAP.Right-sided hemothorax, loculated pleural effusion - CT-guided chest tube placement by IR on 1/6 after discussing case with cardiothoracic surgery.  Pulmonary following, status post lytic treatment with last dose 1/13. Pt was discharged from therapy services on 10/07/23 per MD request due to medical decline. New therapy orders received due to change in pt's status.    PT Comments  The [patient is alert today and able to answer simple questions, eyes open. Patient's  wife present.  Patient also follows simple directions with increased time. Mod  assistance of 2  persons to  mobilize and ambulate x 20' to BR. While on toilet, patient had onset of  heaves, appeared  pale. Assisted from toilet with +2  mod support and ambulated x 6' with Rw to recliner brought closer to BR. BP 159/81. Patient reported feeling  better after settled in recliner. Patient will benefit from intensive inpatient follow up therapy, >3 hours/day   Patient's family report that they plan   for patient to return home, daughter works from home, wife present and has a son  available.   If plan is discharge home, recommend the following: Assistance with cooking/housework;Assist for transportation;Help with stairs or ramp for entrance;Two people to help with walking and/or transfers;Two people to help with bathing/dressing/bathroom   Can travel by private vehicle        Equipment Recommendations  None recommended by PT    Recommendations for Other Services Rehab consult     Precautions / Restrictions Precautions Precautions: Fall Restrictions Weight  Bearing Restrictions Per Provider Order: No     Mobility  Bed Mobility Overal bed mobility: Needs Assistance Bed Mobility: Supine to Sit Rolling: +2 for physical assistance, Used rails, Mod assist Sidelying to sit: Mod assist, HOB elevated, Used rails       General bed mobility comments: multimodal cues  to roll and use rail, wife attempting to assist patient , encouraged wife to not assist. Patient required  mod support to sit upright, cues   and assistance to scoot forward.    Transfers Overall transfer level: Needs assistance Equipment used: Rolling walker (2 wheels) Transfers: Sit to/from Stand Sit to Stand: Mod assist, +2 physical assistance, +2 safety/equipment           General transfer comment: assist to power up to stand from bed  at RW, posterior bias initially. Improved as began to take steps.x 2 and from [facility] after pivot transfer  to [facility] and back to bed.    Ambulation/Gait Ambulation/Gait assistance: Mod assist, +2 physical assistance, +2 safety/equipment Gait Distance (Feet): 20 Feet Assistive device: Rolling walker (2 wheels) Gait Pattern/deviations: Step-to pattern, Staggering left, Staggering right       General Gait Details: patient  required  multimodal cues to advance with RW, turn and  hold rail to sit down to toilet, cues and maxx support to stand from low toiet, then amb 6 ' to reclienr as patient appeared weak and pale, having dry heaves.   Stairs             Wheelchair Mobility     Tilt Bed    Modified Rankin (Stroke  Patients Only)       Balance   Sitting-balance support: Feet supported, No upper extremity supported, Bilateral upper extremity supported Sitting balance-Leahy Scale: Fair     Standing balance support: During functional activity, Reliant on assistive device for balance, Bilateral upper extremity supported Standing balance-Leahy Scale: Poor Standing balance comment: steady assist  to stand at [employer] Arousal: Alert Behavior During Therapy: Flat affect Overall Cognitive Status: Impaired/Different from baseline Area of Impairment: Attention, Safety/judgement, Problem solving, Awareness, Following commands, Orientation, Memory                   Current Attention Level: Sustained Memory: Decreased short-term memory, Decreased recall of precautions Following Commands: Follows one step commands with increased time Safety/Judgement: Decreased awareness of deficits Awareness: Emergent   General Comments: patient awake and able to answer  simple questions, following directions with increased time . Patient indicated need for BM.        Exercises      General Comments        Pertinent Vitals/Pain Pain Assessment Pain Assessment: No/denies pain    Home Living                          Prior Function            PT Goals (current goals can now be found in the care plan section) Progress towards PT goals: Progressing toward goals    Frequency    Min 1X/week      PT Plan      Co-evaluation PT/OT/SLP Co-Evaluation/Treatment: Yes Reason for Co-Treatment: Complexity of the patient's impairments (multi-system involvement);Necessary to address cognition/behavior during functional activity;To address functional/ADL transfers PT goals addressed during session: Mobility/safety with mobility OT goals addressed during session: ADL's and self-care      AM-PAC PT "6 Clicks" Mobility   Outcome Measure  Help needed turning from your back to your side while in a flat bed without using bedrails?: A Lot Help needed moving from lying on your back to sitting on the side of a flat bed without using bedrails?: A Lot Help needed moving to and from a bed to a chair (including a wheelchair)?: A Lot Help needed standing up from a chair using your arms (e.g., wheelchair or bedside chair)?: A Lot Help needed to walk in hospital room?: A Lot Help  needed climbing 3-5 steps with a railing? : Total 6 Click Score: 11    End of Session Equipment Utilized During Treatment: Gait belt Activity Tolerance: Patient limited by fatigue Patient left: in chair;with call bell/phone within reach;with family/visitor present Nurse Communication: Mobility status PT Visit Diagnosis: Difficulty in walking, not elsewhere classified (R26.2)     Time: 1610-9604 PT Time Calculation (min) (ACUTE ONLY): 23 min  Charges:    $Gait Training: 8-22 mins PT General Charges $$ ACUTE PT VISIT: 1 Visit                     Blanchard Kelch PT Acute Rehabilitation Services Office [phone] Weekend pager-[phone]    Rada Hay 10/19/2023, 1:10 PM

## 2023-10-19 NOTE — Plan of Care (Signed)
  Problem: Coping: Goal: Level of anxiety will decrease Outcome: Progressing   Problem: Elimination: Goal: Will not experience complications related to bowel motility Outcome: Progressing Goal: Will not experience complications related to urinary retention Outcome: Progressing   Problem: Pain Management: Goal: General experience of comfort will improve Outcome: Progressing   Problem: Safety: Goal: Ability to remain free from injury will improve Outcome: Progressing

## 2023-10-20 DIAGNOSIS — D689 Coagulation defect, unspecified: Secondary | ICD-10-CM | POA: Insufficient documentation

## 2023-10-20 DIAGNOSIS — J9601 Acute respiratory failure with hypoxia: Secondary | ICD-10-CM | POA: Insufficient documentation

## 2023-10-20 DIAGNOSIS — K921 Melena: Secondary | ICD-10-CM | POA: Diagnosis not present

## 2023-10-20 DIAGNOSIS — E876 Hypokalemia: Secondary | ICD-10-CM | POA: Insufficient documentation

## 2023-10-20 DIAGNOSIS — R310 Gross hematuria: Secondary | ICD-10-CM | POA: Insufficient documentation

## 2023-10-20 LAB — COMPREHENSIVE METABOLIC PANEL
ALT: 8 U/L (ref 0–44)
AST: 19 U/L (ref 15–41)
Albumin: 2.4 g/dL — ABNORMAL LOW (ref 3.5–5.0)
Alkaline Phosphatase: 47 U/L (ref 38–126)
Anion gap: 6 (ref 5–15)
BUN: 23 mg/dL (ref 8–23)
CO2: 23 mmol/L (ref 22–32)
Calcium: 7.6 mg/dL — ABNORMAL LOW (ref 8.9–10.3)
Chloride: 109 mmol/L (ref 98–111)
Creatinine, Ser: 1.29 mg/dL — ABNORMAL HIGH (ref 0.61–1.24)
GFR, Estimated: 58 mL/min — ABNORMAL LOW (ref >60)
Glucose, Bld: 103 mg/dL — ABNORMAL HIGH (ref 70–99)
Potassium: 3.3 mmol/L — ABNORMAL LOW (ref 3.5–5.1)
Sodium: 138 mmol/L (ref 135–145)
Total Bilirubin: 0.8 mg/dL (ref 0.0–1.2)
Total Protein: 5.4 g/dL — ABNORMAL LOW (ref 6.5–8.1)

## 2023-10-20 LAB — CBC
HCT: 25.4 % — ABNORMAL LOW (ref 39.0–52.0)
Hemoglobin: 7.9 g/dL — ABNORMAL LOW (ref 13.0–17.0)
MCH: 30.2 pg (ref 26.0–34.0)
MCHC: 31.1 g/dL (ref 30.0–36.0)
MCV: 96.9 fL (ref 80.0–100.0)
Platelets: 150 10*3/uL (ref 150–400)
RBC: 2.62 MIL/uL — ABNORMAL LOW (ref 4.22–5.81)
RDW: 17.5 % — ABNORMAL HIGH (ref 11.5–15.5)
WBC: 4.5 10*3/uL (ref 4.0–10.5)
nRBC: 0 % (ref 0.0–0.2)

## 2023-10-20 LAB — MAGNESIUM: Magnesium: 1.8 mg/dL (ref 1.7–2.4)

## 2023-10-20 LAB — PHOSPHORUS: Phosphorus: 2.9 mg/dL (ref 2.5–4.6)

## 2023-10-20 MED ORDER — POTASSIUM CHLORIDE 20 MEQ PO PACK
40.0000 meq | PACK | Freq: Two times a day (BID) | ORAL | Status: AC
  Administered 2023-10-20: 40 meq via ORAL
  Filled 2023-10-20 (×2): qty 2

## 2023-10-20 MED ORDER — BOOST / RESOURCE BREEZE PO LIQD CUSTOM
1.0000 | Freq: Two times a day (BID) | ORAL | Status: DC
  Administered 2023-10-20: 1 via ORAL

## 2023-10-20 NOTE — Assessment & Plan Note (Addendum)
Due to coagulopathy, resolved.  Prior hospitalist discussed with Dr. Annabell Howells on 12/30 [facility] Urology follow up

## 2023-10-20 NOTE — Assessment & Plan Note (Signed)
-   Hold fibrate - Continue Crestor

## 2023-10-20 NOTE — Progress Notes (Signed)
  Daily Progress Note   Patient Name: Joshua [name]       Date: 10/20/2023 DOB: 1951-01-02  Age: 73 y.o. MRN#: [id number] Attending Physician: Alberteen Sam, * Primary Care Physician: [name], MD Admit Date: 09/24/2023 Length of Stay: 26 days  Reason for Consultation/Follow-up: Establishing goals of care  Subjective:   CC: Patient awake alert resting in bed, wife at bedside. Following up regarding complex medical decision making.   Subjective:  Reviewed EMR prior to presenting to bedside.  Patient states he participated with PT on 10-19-23, appears weak.    Objective:   Vital Signs:  BP (!) 150/72 (BP Location: Right Arm)   Pulse 80   Temp 98.3 F (36.8 C) (Oral)   Resp 18   Ht 5\' 8"  (1.727 m)   Wt 102.6 kg   SpO2 97%   BMI 34.39 kg/m   Physical Exam: General: Awake with eyes closed, chronically ill appearing Cardiovascular: tachycardia noted Respiratory: no increased work of breathing noted, not in respiratory distress, chest tube in place Neuro: awake alert no distress.   Imaging:  I personally reviewed recent imaging.   Assessment & Plan:   Assessment: Patient is a 73 year old M with a PMHx of PAF/flutter on Pradaxa, CAD s/p stents in 2009, DM-2, CKD-3A, OSA not on CPAP, HTN, HLD and osteoarthritis admitted on 09/23/24 for management of progressive generalized weakness, dizziness, dyspnea, melena, and edema for weeks. During hospitalization has received management for GI bleed, aspiration PNA, spontaneous hemothorax 2/2 coagulopathy s/p chest tube placement with lytic use, and delirium. Palliative medicine team consulted to assist with complex medical decision making.   Recommendations/Plan: # Complex medical decision making/goals of care:  Recommend outpatient palliative. SNF versus home being considered.   -  Code Status: Limited: Do not attempt resuscitation (DNR) -DNR-LIMITED -Do Not Intubate/DNI   # Symptom  management  -Odynophagia   -Discontinue Carafate 1 g scheduled 3 times daily   -Continue oral viscous lidocaine as needed  # Psychosocial Support:  -Wife, daughter  # Discharge Planning: SNF versus home discharge being considered, consider outpatient palliative care.   Discussed with: patient and wife  Thank you for allowing the palliative care team to participate in the care Joshua [name].  Mod MDM Rosalin Hawking MD Palliative Care Provider PMT # [phone]  If patient remains symptomatic despite maximum doses, please call PMT at [phone] between 0700 and 1900. Outside of these hours, please call attending, as PMT does not have night coverage.  Personally spent 36 minutes in patient care including extensive chart review (labs, imaging, progress/consult notes, vital signs), medically appropraite exam, discussed with treatment team, education to patient, family, and staff, documenting clinical information, medication review and management, coordination of care, and available advanced directive documents.

## 2023-10-20 NOTE — Hospital Course (Addendum)
73 y.o. M with pAF on Pradaxa, CAD s/p PCI >5 yrs, DM, OSA, HTN, obesity, HLD, and CKD IIIa who presented with dizziness, melena.    In the ER, found to have GIB, AKI.  Praxbind given.  GI consulted, and EGD showed AVMs.  Then developed worsening hypoxia, found to have a loculated pleural effusion, chest tube was placed and eventually removed successfully.    Hospital course also complicated by persistent encephalopathy/delirium, now resolved.

## 2023-10-20 NOTE — TOC Progression Note (Addendum)
Transition of Care [facility]) - Progression Note    Patient Details  Name: Joshua [name] MRN: [id number] Date of Birth: 1950/11/08  Transition of Care [facility]) CM/SW Contact  Larrie Kass, LCSW Phone Number: 10/20/2023, 10:50 AM  Clinical Narrative:     CSW met with the patient, spouse, and MD to discuss the discharge plan. The pt is undecided between SNF vs HH. Pt gave permission for CSW to fax his information for SNF placement. He stated he will make a decision after reviewing bed offers. TOC to follow.   Adden  2:30pm  CSW presented bed offers to pt and his wife , they are requesting time to review. TOC to follow.   Expected Discharge Plan:  (TBD) Barriers to Discharge: Continued Medical Work up  Expected Discharge Plan and Services       Living arrangements for the past 2 months: Single Family Home                                       Social Determinants of Health (SDOH) Interventions SDOH Screenings   Food Insecurity: No Food Insecurity (09/24/2023)  Housing: Low Risk  (09/25/2023)  Transportation Needs: No Transportation Needs (09/24/2023)  Utilities: Not At Risk (09/24/2023)  Social Connections: Patient Unable To Answer (09/27/2023)  Tobacco Use: Medium Risk (09/30/2023)    Readmission Risk Interventions    09/27/2023   10:04 AM  Readmission Risk Prevention Plan  Transportation Screening Complete  PCP or Specialist Appt within 5-7 Days Complete  Home Care Screening Complete  Medication Review (RN CM) Complete

## 2023-10-20 NOTE — Assessment & Plan Note (Signed)
-   Continue PPI - Outpatient follow-up with GI

## 2023-10-20 NOTE — Assessment & Plan Note (Signed)
 Resolved

## 2023-10-20 NOTE — Assessment & Plan Note (Signed)
Acute blood loss anemia due to GI bleed Treated with Praxbind, vitamin K, FFP, 2 units of packed red blood cells.  GI consulted, underwent EGD and found to have AVMs that were treated with APC.  Since then, hemoglobin has been stable, he has resumed Pradaxa, no further bleeding, and hemoglobin appears good - Continue Pradaxa - Trend hemoglobin

## 2023-10-20 NOTE — Assessment & Plan Note (Signed)
-   Supp K 

## 2023-10-20 NOTE — [place] FL2 (Signed)
[place] MEDICAID FL2 LEVEL OF CARE FORM     IDENTIFICATION  Patient Name: Joshua [name] Birthdate: 1951-07-25 Sex: male Admission Date (Current Location): 09/24/2023  [facility] and [place] Number:  Producer, television/film/video and Address:  [facility],  [address] [place], [place] [address]      Provider Number: [id number]  Attending Physician Name and Address:  Alberteen Sam, *  Relative Name and Phone Number:  Bash, Eisenberg (Spouse)  [phone] (Mobile)    Current Level of Care: Hospital Recommended Level of Care: Skilled Nursing Facility Prior Approval Number:    Date Approved/Denied:   PASRR Number: [id number]  Discharge Plan: SNF    Current Diagnoses: Patient Active Problem List   Diagnosis Date Noted   Acute respiratory failure with hypoxia (HCC) 10/20/2023   Gross hematuria 10/20/2023   Morbid obesity (HCC) 10/20/2023   Coagulopathy (HCC) 10/20/2023   Hypokalemia 10/20/2023   Medication management 10/16/2023   Odynophagia 10/15/2023   Need for emotional support 10/13/2023   Palliative care encounter 10/12/2023   Goals of care, counseling/discussion 10/12/2023   Counseling and coordination of care 10/12/2023   Pleural effusion 10/10/2023   Hemothorax on right 10/01/2023   Hemothorax 09/30/2023   Pneumothorax 09/30/2023   Symptomatic anemia 09/24/2023   Gastrointestinal hemorrhage with melena 09/24/2023   Hyperglycemia 09/24/2023   Protein-calorie malnutrition, severe (HCC) 09/24/2023   Thrombocytopenia (HCC) 09/24/2023   Hyponatremia 09/24/2023   AKI (acute kidney injury) (HCC) 09/24/2023   CKD stage 3a, GFR 45-59 ml/min (HCC) 09/24/2023   S/P lumbar laminectomy 04/29/2018   Hearing problem    Chest pain, unspecified 06/23/2016   Abnormal nuclear stress test 09/27/2014   Crescendo angina (HCC) 09/26/2014   Pulmonary hypertension (HCC) 09/26/2014   Internal hemorrhoids 01/19/2014   Diverticulosis of colon without hemorrhage 01/19/2014    Paroxysmal atrial fibrillation (HCC) 04/29/2010   PALPITATIONS 04/21/2010   External hemorrhoids 11/01/2009   HYPERKALEMIA 09/09/2009   Type 2 diabetes mellitus (HCC) 08/16/2009   Hyperlipidemia 08/16/2009   Essential hypertension 08/16/2009   Coronary artery disease due to lipid rich plaque 08/16/2009   Disorder resulting from impaired renal function 08/16/2009    Orientation RESPIRATION BLADDER Height & Weight     Self, Time, Situation, Place  Normal Incontinent, External catheter Weight: 226 lb 3.1 oz (102.6 kg) Height:  5\' 8"  (172.7 cm)  BEHAVIORAL SYMPTOMS/MOOD NEUROLOGICAL BOWEL NUTRITION STATUS      Incontinent Diet (HEart)  AMBULATORY STATUS COMMUNICATION OF NEEDS Skin   Extensive Assist Verbally Skin abrasions                       Personal Care Assistance Level of Assistance  Bathing, Feeding, Dressing Bathing Assistance: Limited assistance Feeding assistance: Independent Dressing Assistance: Limited assistance     Functional Limitations Info  Sight, Hearing, Speech Sight Info: Adequate Hearing Info: Adequate Speech Info: Adequate    SPECIAL CARE FACTORS FREQUENCY  PT (By licensed PT), OT (By licensed OT)     PT Frequency: 5 x a week OT Frequency: 5 x a week            Contractures Contractures Info: Not present    Additional Factors Info  Code Status, Allergies Code Status Info: DNR Allergies Info: Desipramine,Niacin           Current Medications (10/20/2023):  This is the current hospital active medication list Current Facility-Administered Medications  Medication Dose Route Frequency Provider Last Rate Last Admin   acetaminophen (  TYLENOL) tablet 650 mg  650 mg Oral Q6H PRN Almon Hercules, MD   650 mg at 10/18/23 2034   Or   acetaminophen (TYLENOL) suppository 650 mg  650 mg Rectal Q6H PRN Almon Hercules, MD       alum & mag hydroxide-simeth (MAALOX/MYLANTA) 200-200-20 MG/5ML suspension 30 mL  30 mL Oral Q4H PRN Candelaria Stagers T, MD   30 mL  at 10/10/23 2237   carvedilol (COREG) tablet 12.5 mg  12.5 mg Oral BID WC Leatha Gilding, MD   12.5 mg at 10/20/23 6045   Chlorhexidine Gluconate Cloth 2 % PADS 6 each  6 each Topical Daily Almon Hercules, MD   6 each at 10/20/23 4098   dabigatran (PRADAXA) capsule 150 mg  150 mg Oral Q12H Leatha Gilding, MD   150 mg at 10/20/23 1191   hydrALAZINE (APRESOLINE) injection 10 mg  10 mg Intravenous Q4H PRN Leatha Gilding, MD   10 mg at 10/14/23 1421   hydrocortisone cream 1 %   Topical PRN Rai, Delene Ruffini, MD   Given at 10/01/23 2242   hydrOXYzine (ATARAX) tablet 10 mg  10 mg Oral TID PRN Leatha Gilding, MD   10 mg at 10/16/23 2207   isosorbide mononitrate (IMDUR) 24 hr tablet 30 mg  30 mg Oral Daily Leatha Gilding, MD   30 mg at 10/20/23 0921   lidocaine (XYLOCAINE) 2 % viscous mouth solution 15 mL  15 mL Mouth/Throat Q4H PRN Mims, Lauren W, DO       metoprolol tartrate (LOPRESSOR) injection 2.5 mg  2.5 mg Intravenous Q4H PRN Gonfa, Taye T, MD       ondansetron (ZOFRAN) tablet 4 mg  4 mg Oral Q6H PRN Almon Hercules, MD       Or   ondansetron (ZOFRAN) injection 4 mg  4 mg Intravenous Q6H PRN Candelaria Stagers T, MD   4 mg at 10/10/23 0159   Oral care mouth rinse  15 mL Mouth Rinse PRN Gonfa, Taye T, MD       pantoprazole (PROTONIX) injection 40 mg  40 mg Intravenous Q12H Candelaria Stagers T, MD   40 mg at 10/20/23 4782   potassium chloride (KLOR-CON) packet 40 mEq  40 mEq Oral BID Alberteen Sam, MD   40 mEq at 10/20/23 9562   rosuvastatin (CRESTOR) tablet 20 mg  20 mg Oral Daily Leatha Gilding, MD   20 mg at 10/20/23 1308   sodium chloride (OCEAN) 0.65 % nasal spray 2 spray  2 spray Each Nare PRN Candelaria Stagers T, MD   2 spray at 10/18/23 2126   sodium chloride flush (NS) 0.9 % injection 10 mL  10 mL Intravenous Q12H Rai, Ripudeep K, MD   10 mL at 10/20/23 6578   sodium chloride flush (NS) 0.9 % injection 10-40 mL  10-40 mL Intracatheter PRN Rai, Ripudeep K, MD   10 mL at 10/04/23 2134    tamsulosin (FLOMAX) capsule 0.4 mg  0.4 mg Oral Daily Rai, Ripudeep K, MD   0.4 mg at 10/20/23 4696     Discharge Medications: Please see discharge summary for a list of discharge medications.  Relevant Imaging Results:  Relevant Lab Results:   Additional Information SSN:[id number]  Valentina Shaggy Emrey Thornley, LCSW

## 2023-10-20 NOTE — Assessment & Plan Note (Addendum)
 Resolved

## 2023-10-20 NOTE — Assessment & Plan Note (Signed)
Glucose here normal off SSI - Hold Actos

## 2023-10-20 NOTE — Assessment & Plan Note (Signed)
Blood pressure normal - Continue carvedilol, Imdur

## 2023-10-20 NOTE — Progress Notes (Signed)
Progress Note   Patient: Joshua [name] YNW:[id number] DOB: September 25, 1951 DOA: 09/24/2023     26 DOS: the patient was seen and examined on 10/20/2023 at 10:23AM      Brief hospital course: 73 y.o. M with pAF on Pradaxa, CAD s/p PCI >5 yrs, DM, OSA, HTN, obesity, HLD, and CKD IIIa who presented with dizziness, melena.    In the ER, found to have GIB, AKI.  Praxbind given.  GI consulted, and EGD showed AVMs.  Then developed worsening hypoxia, found to have a loculated pleural effusion, chest tube was placed and eventually removed successfully.    Hospital course also complicated by persistent encephalopathy/delirium, now resolved.     Assessment and Plan: * Gastrointestinal hemorrhage with melena - Continue PPI - Outpatient follow-up with GI  Hemothorax on right Loculated pleural effusion Initially had a chest tube placed 1/1 which had no output and was removed.  Had second chest tube placed 1/6, CT surgery and pulmonology were consulted, the patient was given lytic treatment x2, and the chest tube was removed 1/17  He completed 7 days Abx therapy and has been afebrile now. - Outpatient Pulm follow up    Symptomatic anemia Acute blood loss anemia due to GI bleed Treated with Praxbind, vitamin K, FFP, 2 units of packed red blood cells.  GI consulted, underwent EGD and found to have AVMs that were treated with APC.  Since then, hemoglobin has been stable, he has resumed Pradaxa, no further bleeding, and hemoglobin appears good - Continue Pradaxa - Trend hemoglobin  Hypokalemia - Supp K  Coagulopathy (HCC) INR 9.5 on admission, S/p Praxbind, vitamin K and FFP.    - Repeat INR  Morbid obesity (HCC) History of BMI >35 with comorbidities  Gross hematuria Due to coagulopathy, resolved.  Prior hospitalist discussed with Dr. Annabell Howells on 12/30 [facility] Urology follow up  Acute respiratory failure with hypoxia (HCC) Resolved  CKD stage 3a, GFR 45-59 ml/min (HCC) Cr stable  relative to baseline 1.2  AKI (acute kidney injury) (HCC) Resolved  Hyponatremia Resolved  Thrombocytopenia (HCC) Resolved  Protein-calorie malnutrition, severe (HCC) - Consult dietitian  Pulmonary hypertension (HCC)    Paroxysmal atrial fibrillation (HCC) Rate controlled - Continue Pradaxa, carvedilol  Coronary artery disease with chronic angina - Continue carvedilol, Imdur, Crestor  Essential hypertension Blood pressure normal - Continue carvedilol, Imdur  Hyperlipidemia - Hold fibrate - Continue Crestor  Type 2 diabetes mellitus (HCC) Glucose here normal off SSI - Hold Actos          Subjective: Tired, has generalized malaise, "stomach hurts", wants to go home.  No dyspnea, swelling, fever, confusion.     Physical Exam: BP (!) 150/72 (BP Location: Right Arm)   Pulse 80   Temp 98.3 F (36.8 C) (Oral)   Resp 18   Ht 5\' 8"  (1.727 m)   Wt 102.6 kg   SpO2 97%   BMI 34.39 kg/m   Obese adult male, sitting up in bed, watching television, does not make eye contact RRR, no murmurs, no peripheral edema Respiratory rate seems normal, lung sounds diminished on the right but no rales or wheezes appreciated Abdomen soft, no tenderness palpation Attentive to questions, affect blunted and flat, face symmetric, speech fluent, paucity of speech, generalized weakness but symmetric strength in the upper extremities, oriented to person, place, time    Data Reviewed: Basic metabolic panel shows creatinine stable at 1.3, potassium low at 3.3 CBC shows hemoglobin down to 7.9, platelets and white blood  cell count normal CT head unremarkable   Family Communication: Wife at the bedside    Disposition: Status is: Inpatient         Author: Alberteen Sam, MD 10/20/2023 1:31 PM  For on call review [email or web address].

## 2023-10-20 NOTE — Assessment & Plan Note (Signed)
 Consult dietitian

## 2023-10-20 NOTE — Plan of Care (Signed)

## 2023-10-20 NOTE — Progress Notes (Signed)
Inpatient Rehab Admissions Coordinator:    Pt. Declines CIR, prefers SNF vs HH. TOC is working on d/c plan.  Megan Salon, MS, CCC-SLP Rehab Admissions Coordinator  [phone] (celll) [phone] (office)

## 2023-10-20 NOTE — Assessment & Plan Note (Signed)
Cr stable relative to baseline 1.2

## 2023-10-20 NOTE — Plan of Care (Signed)
  Problem: Education: Goal: Knowledge of General Education information will improve Description: Including pain rating scale, medication(s)/side effects and non-pharmacologic comfort measures Outcome: Progressing   Problem: Health Behavior/Discharge Planning: Goal: Ability to manage health-related needs will improve Outcome: Progressing   Problem: Clinical Measurements: Goal: Ability to maintain clinical measurements within normal limits will improve Outcome: Progressing Goal: Will remain free from infection Outcome: Progressing Goal: Diagnostic test results will improve Outcome: Progressing Goal: Respiratory complications will improve Outcome: Progressing Goal: Cardiovascular complication will be avoided Outcome: Progressing   Problem: Activity: Goal: Risk for activity intolerance will decrease Outcome: Progressing   Problem: Nutrition: Goal: Adequate nutrition will be maintained Outcome: Progressing   Problem: Coping: Goal: Level of anxiety will decrease Outcome: Progressing   Problem: Elimination: Goal: Will not experience complications related to bowel motility Outcome: Progressing Goal: Will not experience complications related to urinary retention Outcome: Progressing   Problem: Pain Management: Goal: General experience of comfort will improve Outcome: Progressing   Problem: Safety: Goal: Ability to remain free from injury will improve Outcome: Progressing   Problem: Education: Goal: Ability to describe self-care measures that may prevent or decrease complications (Diabetes Survival Skills Education) will improve Outcome: Progressing Goal: Individualized Educational Video(s) Outcome: Progressing   Problem: Coping: Goal: Ability to adjust to condition or change in health will improve Outcome: Progressing   Problem: Fluid Volume: Goal: Ability to maintain a balanced intake and output will improve Outcome: Progressing   Problem: Health  Behavior/Discharge Planning: Goal: Ability to identify and utilize available resources and services will improve Outcome: Progressing Goal: Ability to manage health-related needs will improve Outcome: Progressing   Problem: Metabolic: Goal: Ability to maintain appropriate glucose levels will improve Outcome: Progressing   Problem: Nutritional: Goal: Maintenance of adequate nutrition will improve Outcome: Progressing Goal: Progress toward achieving an optimal weight will improve Outcome: Progressing   Problem: Tissue Perfusion: Goal: Adequacy of tissue perfusion will improve Outcome: Progressing

## 2023-10-20 NOTE — Assessment & Plan Note (Addendum)
Loculated pleural effusion Initially had a chest tube placed 1/1 which had no output and was removed.  Had second chest tube placed 1/6, CT surgery and pulmonology were consulted, the patient was given lytic treatment x2, and the chest tube was removed 1/17  He completed 7 days Abx therapy and has been afebrile now. - Outpatient Pulm follow up

## 2023-10-20 NOTE — Assessment & Plan Note (Addendum)
INR 9.5 on admission, S/p Praxbind, vitamin K and FFP.    - Repeat INR

## 2023-10-20 NOTE — Assessment & Plan Note (Signed)
-   Continue carvedilol, Imdur, Crestor

## 2023-10-20 NOTE — Assessment & Plan Note (Signed)
Rate controlled - Continue Pradaxa, carvedilol

## 2023-10-20 NOTE — Progress Notes (Signed)
Initial Nutrition Assessment  DOCUMENTATION CODES:   Obesity unspecified  INTERVENTION:  - Heart diet per MD.  - Boost Breeze po TID, each supplement provides 250 kcal and 9 grams of protein - Encourage intake at all meals and of supplements as tolerated.  - Monitor weight trends.   NUTRITION DIAGNOSIS:   Increased nutrient needs related to acute illness as evidenced by estimated needs.  GOAL:   Patient will meet greater than or equal to 90% of their needs  MONITOR:   PO intake, Supplement acceptance, Weight trends  REASON FOR ASSESSMENT:   Consult Assessment of nutrition requirement/status  ASSESSMENT:   73 y.o. M with CAD, DM, OSA, HTN, obesity, HLD, and CKD IIIa who presented with dizziness, melena. Admitted for GIB.  Patient unsure of a UBW or if he has had any changes in weight recently. Per EMR, last weight prior to this admission was back In July 2024, when patient was weighed at 253#. He was weighed at 244# at the beginning of this admission and is now weighed at 226#, which could suggest weight loss since admission. However, patient noted to be -8.8L which could be impacting current weight status. Will monitor weight trends.   Patient endorses typically eating 2 meals a day at home. Feels he has been eating fairly well over the past few days.  Wife at bedside reports that earlier this admission patient was not eating well due AMS and was often unable to really feed himself. Patient was previously on clear liquid/full liquid diet for the first few days of admission but has been on a heart healthy diet since 1/3.  Wife reports patient's intake has improved significantly over the past few days. He is documented to have consumed 100% of meals during the past 3 days.  He tried Ensure earlier this admission but didn't like the milky consistency. Wife feels he may be willing to try Boost Breeze. Will also order a multivitamin to support micronutrient needs.     Medications reviewed and include: -  Labs reviewed:  K+ 3.3 Creatinine 1.29 HA1C 5.5 Blood Glucose: not being checked   NUTRITION - FOCUSED PHYSICAL EXAM:  Flowsheet Row Most Recent Value  Orbital Region Mild depletion  Upper Arm Region No depletion  Thoracic and Lumbar Region No depletion  Buccal Region Mild depletion  Temple Region No depletion  Clavicle Bone Region No depletion  Clavicle and Acromion Bone Region No depletion  Scapular Bone Region Unable to assess  Dorsal Hand No depletion  Patellar Region No depletion  Anterior Thigh Region No depletion  Posterior Calf Region No depletion  Edema (RD Assessment) None  Hair Reviewed  Eyes Reviewed  Mouth Reviewed  Skin Reviewed  Nails Reviewed       Diet Order:   Diet Order             Diet Heart Room service appropriate? Yes; Fluid consistency: Thin  Diet effective now                   EDUCATION NEEDS:  Education needs have been addressed  Skin:  Skin Assessment: Reviewed RN Assessment  Last BM:  1/20  Height:  Ht Readings from Last 1 Encounters:  10/16/23 5\' 8"  (1.727 m)   Weight:  Wt Readings from Last 1 Encounters:  10/20/23 102.6 kg   Ideal Body Weight:  70 kg  BMI:  Body mass index is 34.39 kg/m.  Estimated Nutritional Needs:  Kcal:  1750-1950 kcals Protein:  80-90  grams Fluid:  >/= 1.7L    Shelle Iron RD, LDN Contact via Secure Chat.

## 2023-10-20 NOTE — Assessment & Plan Note (Signed)
History of BMI >35 with comorbidities

## 2023-10-21 DIAGNOSIS — K921 Melena: Secondary | ICD-10-CM | POA: Diagnosis not present

## 2023-10-21 LAB — CBC
HCT: 25.6 % — ABNORMAL LOW (ref 39.0–52.0)
Hemoglobin: 7.8 g/dL — ABNORMAL LOW (ref 13.0–17.0)
MCH: 30 pg (ref 26.0–34.0)
MCHC: 30.5 g/dL (ref 30.0–36.0)
MCV: 98.5 fL (ref 80.0–100.0)
Platelets: 155 10*3/uL (ref 150–400)
RBC: 2.6 MIL/uL — ABNORMAL LOW (ref 4.22–5.81)
RDW: 17.6 % — ABNORMAL HIGH (ref 11.5–15.5)
WBC: 3.6 10*3/uL — ABNORMAL LOW (ref 4.0–10.5)
nRBC: 0.5 % — ABNORMAL HIGH (ref 0.0–0.2)

## 2023-10-21 LAB — BASIC METABOLIC PANEL
Anion gap: 10 (ref 5–15)
BUN: 25 mg/dL — ABNORMAL HIGH (ref 8–23)
CO2: 21 mmol/L — ABNORMAL LOW (ref 22–32)
Calcium: 7.9 mg/dL — ABNORMAL LOW (ref 8.9–10.3)
Chloride: 109 mmol/L (ref 98–111)
Creatinine, Ser: 1.1 mg/dL (ref 0.61–1.24)
GFR, Estimated: >60 mL/min (ref >60)
Glucose, Bld: 102 mg/dL — ABNORMAL HIGH (ref 70–99)
Potassium: 3.6 mmol/L (ref 3.5–5.1)
Sodium: 140 mmol/L (ref 135–145)

## 2023-10-21 LAB — PROTIME-INR
INR: 1.2 (ref 0.8–1.2)
Prothrombin Time: 15.8 s — ABNORMAL HIGH (ref 11.4–15.2)

## 2023-10-21 MED ORDER — FOLIC ACID 1 MG PO TABS
1.0000 mg | ORAL_TABLET | Freq: Every day | ORAL | Status: DC
  Administered 2023-10-21 – 2023-10-24 (×4): 1 mg via ORAL
  Filled 2023-10-21 (×4): qty 1

## 2023-10-21 NOTE — PMR Pre-admission (Signed)
PMR Admission Coordinator Pre-Admission Assessment  Patient: Joshua [name] is an 73 y.o., male MRN: [id number] DOB: 07-16-1951 Height: 5\' 8"  (172.7 cm) Weight: 102.6 kg  Insurance Information HMO:     PPO: Yes     PCP:       IPA:       80/20:       OTHER: Group [id number] [place] PRIMARY: Orpah [place] PPO premier plus      Policy#: [id number]      Subscriber: patient CM Name:       Phone#: [phone]     Fax#: [phone] Pre-Cert#: [id number] approved from 10/21/23 to 11/02/23      Employer: Not working Benefits:  Phone #: [phone]     Name: [email or web address] Eff. Date: 09/29/23     Deduct:  $0      Out of Pocket Max: $4150 (met $0)      Life Max: n/a CIR: $300/day for days 1-6 max $1800/admission      SNF: $10/day for days 1-20; $214/day for days 21-100 Outpatient: med ned     Co-Pay: $10/visit Home Health: 100%      Co-Pay: none DME: 80%     Co-Pay: 20% Providers: in network  SECONDARY:       Policy#:      Phone#:   Artist:       Phone#:   The Data processing manager" for patients in Inpatient Rehabilitation Facilities with attached "Privacy Act Statement-Health Care Records" was provided and verbally reviewed with: Patient  Emergency Contact Information Contact Information     Name Relation Home Work Mobile   [place] Spouse [phone]  [phone]   Zenith, [place] Daughter [phone]        Other Contacts   None on File     Current Medical History  Patient Admitting Diagnosis: Debility/GIB  History of Present Illness: A 73 y.o. M with pAF on Pradaxa, CAD s/p PCI >5 yrs, DM, OSA, HTN, obesity, HLD, and CKD IIIa who presented to [facility] on 09/24/23 with dizziness, melena.  In the ER, found to have GIB, AKI.  Praxbind given.  GI consulted, and EGD showed AVMs.  Then developed worsening hypoxia, found to have a loculated pleural effusion, chest tube was placed and eventually removed successfully.  Hospital course also complicated by  persistent encephalopathy/delirium, now resolved.  PT/OT evaluations were completed with recommendations for acute inpatient rehab admission.   Patient's medical record from [facility] has been reviewed by the rehabilitation admission coordinator and physician.  Past Medical History  Past Medical History:  Diagnosis Date   Arthritis    Atrial fibrillation (HCC)    Atrial flutter (HCC)    Back pain    CAD (coronary artery disease)    stents placed 2009   Complication of anesthesia    woke up during surgery and ablation   DM (diabetes mellitus) (HCC)    type 2   Elevated PSA    Gout    Hearing problem    HTN (hypertension)    Hyperlipidemia    Myocardial infarct, old 2010   Prostatitis    Renal insufficiency     Has the patient had major surgery during 100 days prior to admission? Yes  Family History   family history includes Alzheimer's disease in his mother; Cancer in his brother; Gout in his mother; Heart disease in his brother; Heart failure in his father; Hypertension in his mother.  Current Medications  Current Facility-Administered Medications:  acetaminophen (TYLENOL) tablet 650 mg, 650 mg, Oral, Q6H PRN, 650 mg at 10/20/23 2219 **OR** acetaminophen (TYLENOL) suppository 650 mg, 650 mg, Rectal, Q6H PRN, Gonfa, Taye T, MD   alum & mag hydroxide-simeth (MAALOX/MYLANTA) 200-200-20 MG/5ML suspension 30 mL, 30 mL, Oral, Q4H PRN, Alanda Slim, Taye T, MD, 30 mL at 10/10/23 2237   carvedilol (COREG) tablet 12.5 mg, 12.5 mg, Oral, BID WC, Gherghe, Costin M, MD, 12.5 mg at 10/22/23 1006   dabigatran (PRADAXA) capsule 150 mg, 150 mg, Oral, Q12H, Gherghe, Costin M, MD, 150 mg at 10/22/23 1006   feeding supplement (BOOST / RESOURCE BREEZE) liquid 1 Container, 1 Container, Oral, BID BM, Danford, Earl Lites, MD, 1 Container at 10/20/23 1702   folic acid (FOLVITE) tablet 1 mg, 1 mg, Oral, Daily, Danford, Earl Lites, MD, 1 mg at 10/22/23 1006   hydrALAZINE  (APRESOLINE) injection 10 mg, 10 mg, Intravenous, Q4H PRN, Leatha Gilding, MD, 10 mg at 10/14/23 1421   hydrocortisone cream 1 %, , Topical, PRN, Rai, Ripudeep K, MD, Given at 10/01/23 2242   hydrOXYzine (ATARAX) tablet 10 mg, 10 mg, Oral, TID PRN, Leatha Gilding, MD, 10 mg at 10/20/23 2219   isosorbide mononitrate (IMDUR) 24 hr tablet 30 mg, 30 mg, Oral, Daily, Elvera Lennox, Costin M, MD, 30 mg at 10/22/23 1006   lidocaine (XYLOCAINE) 2 % viscous mouth solution 15 mL, 15 mL, Mouth/Throat, Q4H PRN, Mims, Lauren W, DO   metoprolol tartrate (LOPRESSOR) injection 2.5 mg, 2.5 mg, Intravenous, Q4H PRN, Gonfa, Taye T, MD   ondansetron (ZOFRAN) tablet 4 mg, 4 mg, Oral, Q6H PRN **OR** ondansetron (ZOFRAN) injection 4 mg, 4 mg, Intravenous, Q6H PRN, Alanda Slim, Taye T, MD, 4 mg at 10/10/23 0159   Oral care mouth rinse, 15 mL, Mouth Rinse, PRN, Gonfa, Taye T, MD   [EXPIRED] pantoprazole (PROTONIX) injection 40 mg, 40 mg, Intravenous, Q5 min **FOLLOWED BY** pantoprazole (PROTONIX) injection 40 mg, 40 mg, Intravenous, Q12H, Gonfa, Taye T, MD, 40 mg at 10/22/23 1006   rosuvastatin (CRESTOR) tablet 20 mg, 20 mg, Oral, Daily, Elvera Lennox, Costin M, MD, 20 mg at 10/22/23 1006   sodium chloride (OCEAN) 0.65 % nasal spray 2 spray, 2 spray, Each Nare, PRN, Candelaria Stagers T, MD, 2 spray at 10/18/23 2126   sodium chloride flush (NS) 0.9 % injection 10 mL, 10 mL, Intravenous, Q12H, Rai, Ripudeep K, MD, 10 mL at 10/22/23 1006   sodium chloride flush (NS) 0.9 % injection 10-40 mL, 10-40 mL, Intracatheter, PRN, Rai, Ripudeep K, MD, 10 mL at 10/04/23 2134   tamsulosin (FLOMAX) capsule 0.4 mg, 0.4 mg, Oral, Daily, Rai, Ripudeep K, MD, 0.4 mg at 10/22/23 1006  Patients Current Diet:  Diet Order             Diet Heart Room service appropriate? Yes; Fluid consistency: Thin  Diet effective now                   Precautions / Restrictions Precautions Precautions: Fall Precaution Comments: continous  spitting Restrictions Weight Bearing Restrictions Per Provider Order: No   Has the patient had 2 or more falls or a fall with injury in the past year? No  Prior Activity Level Community (5-7x/wk): Went out daily.  Prior Functional Level Self Care: Did the patient need help bathing, dressing, using the toilet or eating? Independent  Indoor Mobility: Did the patient need assistance with walking from room to room (with or without device)? Independent  Stairs: Did the patient need assistance with  internal or external stairs (with or without device)? Independent  Functional Cognition: Did the patient need help planning regular tasks such as shopping or remembering to take medications? Independent  Patient Information Are you of Hispanic, Latino/a,or Spanish origin?: A. No, not of Hispanic, Latino/a, or Spanish origin What is your race?: A. White Do you need or want an interpreter to communicate with a doctor or health care staff?: 0. No  Patient's Response To:  Health Literacy and Transportation Is the patient able to respond to health literacy and transportation needs?: Yes Health Literacy - How often do you need to have someone help you when you read instructions, pamphlets, or other written material from your doctor or pharmacy?: Never In the past 12 months, has lack of transportation kept you from medical appointments or from getting medications?: No In the past 12 months, has lack of transportation kept you from meetings, work, or from getting things needed for daily living?: No  Home Assistive Devices / Equipment Home Equipment: Gilmer Mor - single point  Prior Device Use: Indicate devices/aids used by the patient prior to current illness, exacerbation or injury? None of the above  Current Functional Level Cognition  Overall Cognitive Status: Impaired/Different from baseline Current Attention Level: Focused Orientation Level: Oriented X4 Following Commands: Follows one step  commands with increased time Safety/Judgement: Decreased awareness of deficits General Comments: delayed processing, requires constant cues to perform and for technique    Extremity Assessment (includes Sensation/Coordination)  Upper Extremity Assessment: Generalized weakness  Lower Extremity Assessment: Defer to PT evaluation    ADLs  Overall ADL's : Needs assistance/impaired Eating/Feeding: Set up, Sitting Grooming: Total assistance, Bed level Grooming Details (indicate cue type and reason): attempted to participate in combing hair sitting EOB with HOH needed for reaching up to top of head. patient not engaged in task. Upper Body Bathing: Total assistance, Sitting Lower Body Bathing: Total assistance, +2 for physical assistance, Bed level Upper Body Dressing : Total assistance, Bed level Lower Body Dressing: Total assistance, +2 for physical assistance, Bed level Toilet Transfer: +2 for safety/equipment, +2 for physical assistance, Minimal assistance, Ambulation, Rolling walker (2 wheels) Toilet Transfer Details (indicate cue type and reason): to bathroom in room with increased time. wife noted to get in the way while trying to assess vitals and manage lines/leads. patient attempting to vomit upon sitting on commode. nurse made aware. HR up to 139 bpm during session Toileting- Clothing Manipulation and Hygiene: Sit to/from stand, Total assistance General ADL Comments: patient was more lethargic during session with daughter and wife hyper involved in session.    Mobility  Overal bed mobility: Needs Assistance Bed Mobility: Rolling, Supine to Sit Rolling: +2 for physical assistance, Used rails, Mod assist Sidelying to sit: +2 for safety/equipment, +2 for physical assistance, Mod assist Supine to sit: Total assist, +2 for physical assistance, +2 for safety/equipment Sit to supine: Mod assist, +2 for physical assistance, +2 for safety/equipment General bed mobility comments: requiring  multimodal cues and time to initiate; pt with loose BM in bed so assisted with pericare and rolling prior to OOB    Transfers  Overall transfer level: Needs assistance Equipment used: Rolling walker (2 wheels) Transfers: Sit to/from Stand Sit to Stand: Min assist, Mod assist, +2 physical assistance Bed to/from chair/wheelchair/BSC transfer type:: Step pivot Stand pivot transfers: Max assist, Total assist, +2 physical assistance, +2 safety/equipment Step pivot transfers: Mod assist, +2 physical assistance General transfer comment: assist to rise and steady, cues for use of RW and  small steps over to recliner, assist to control descent    Ambulation / Gait / Stairs / Wheelchair Mobility  Ambulation/Gait Ambulation/Gait assistance: Mod assist, +2 physical assistance, +2 safety/equipment Gait Distance (Feet): 20 Feet Assistive device: Rolling walker (2 wheels) Gait Pattern/deviations: Step-to pattern, Staggering left, Staggering right General Gait Details: patient  required  multimodal cues to advance with RW, turn and  hold rail to sit down to toilet, cues and maxx support to stand from low toiet, then amb 6 ' to reclienr as patient appeared weak and pale, having dry heaves. Gait velocity: decr    Posture / Balance Balance Overall balance assessment: Needs assistance Sitting-balance support: Feet supported, No upper extremity supported, Bilateral upper extremity supported Sitting balance-Leahy Scale: Fair Standing balance support: During functional activity, Reliant on assistive device for balance, Bilateral upper extremity supported Standing balance-Leahy Scale: Poor Standing balance comment: steady assist  to stand at RW    Special needs/care consideration Skin Sin tears on right arm, left thigh, coccyx and moisture damage on abdomen  and Special service needs None   Previous Home Environment (from acute therapy documentation) Living Arrangements: Spouse/significant other Available  Help at Discharge: Family, Available 24 hours/day Type of Home: House Home Layout: One level Home Access: Stairs to enter Entrance Stairs-Rails: None Entrance Stairs-Number of Steps: 4 Bathroom Shower/Tub: Health visitor: Handicapped height Home Care Services: No  Discharge Living Setting Plans for Discharge Living Setting: Patient's home, House, Lives with (comment) (Lives with wife.) Type of Home at Discharge: House Discharge Home Layout: One level Discharge Home Access: Stairs to enter Entrance Stairs-Rails: None Entrance Stairs-Number of Steps: 2 steps from garage into [employer] entry Discharge Bathroom Shower/Tub: Walk-in shower, Curtain Discharge Bathroom Toilet: Handicapped height Discharge Bathroom Accessibility: Yes How Accessible: Accessible via walker Does the patient have any problems obtaining your medications?: No  Social/Family/Support Systems Patient Roles: Spouse, Parent (Has wife locally) Contact Information: Bernell Sigal - spouse - [phone] Anticipated Caregiver: Wife Ability/Limitations of Caregiver: Wife is retired and can assist. Medical laboratory scientific officer: 24/7 Discharge Plan Discussed with Primary Caregiver: Yes Is Caregiver In Agreement with Plan?: Yes Does Caregiver/Family have Issues with Lodging/Transportation while Pt is in Rehab?: No  Goals Patient/Family Goal for Rehab: PT/OT supervision to min assist goals Expected length of stay: 12-14 days Pt/Family Agrees to Admission and willing to participate: Yes Program Orientation Provided & Reviewed with Pt/Caregiver Including Roles  & Responsibilities: Yes  Decrease burden of Care through IP rehab admission: N/A  Possible need for SNF placement upon discharge: Not planned  Patient Condition: I have reviewed medical records from [facility], spoken with CSW, and patient and spouse. I discussed via phone for inpatient rehabilitation assessment.  Patient will benefit from  ongoing PT/OT, can actively participate in 3 hours of therapy a day 5 days of the week, and can make measurable gains during the admission.  Patient will also benefit from the coordinated team approach during an Inpatient Acute Rehabilitation admission.  The patient will receive intensive therapy as well as Rehabilitation physician, nursing, social worker, and care management interventions.  Due to bladder management, bowel management, safety, skin/wound care, disease management, medication administration, pain management, and patient education the patient requires 24 hour a day rehabilitation nursing.  The patient is currently Min to Mod assist with mobility and basic ADLs.  Discharge setting and therapy post discharge at home with home health is anticipated.  Patient has agreed to participate in the Acute Inpatient Rehabilitation Program and  will admit on Sunday 10/24/23.  Preadmission Screen Completed By:  Trish Mage, 10/22/2023 1:53 PM ______________________________________________________________________   Discussed status with Dr. Riley Kill on 10/22/23 at 0915 and received approval for admission Sunday 10/24/23.  Admission Coordinator:  Trish Mage, RN, time 153/Date 10/22/23   Assessment/Plan: Diagnosis: Debility due to GIB Does the need for close, 24 hr/day Medical supervision in concert with the patient's rehab needs make it unreasonable for this patient to be served in a less intensive setting? Yes Co-Morbidities requiring supervision/potential complications: DM, pAfib, CAD, OSA, HTN, obesity; BPH, hemothorax- loculated Due to bladder management, bowel management, safety, skin/wound care, disease management, medication administration, pain management, and patient education, does the patient require 24 hr/day rehab nursing? Yes Does the patient require coordinated care of a physician, rehab nurse, PT, OT,  to address physical and functional deficits in the context of the above medical  diagnosis(es)? Yes Addressing deficits in the following areas: balance, endurance, locomotion, strength, transferring, bowel/bladder control, bathing, dressing, feeding, grooming, and toileting Can the patient actively participate in an intensive therapy program of at least 3 hrs of therapy 5 days a week? Yes The potential for patient to make measurable gains while on inpatient rehab is good Anticipated functional outcomes upon discharge from inpatient rehab: supervision and min assist PT, supervision and min assist OT, n/a SLP Estimated rehab length of stay to reach the above functional goals is: 12-14 days Anticipated discharge destination: Home 10. Overall Rehab/Functional Prognosis: good   MD Signature:

## 2023-10-21 NOTE — Progress Notes (Signed)
[facility] IP rehab admissions - We received a call from patient's wife this am asking for inpatient rehab admission here at [facility].  I placed a new order for rehab consult and explained rehab program to the wife.  We will open the case with [facility] and request CIR admission.  Call me for questions.  [phone]

## 2023-10-21 NOTE — TOC Progression Note (Signed)
Transition of Care [facility]) - Progression Note    Patient Details  Name: Joshua [name] MRN: [id number] Date of Birth: 02-16-1951  Transition of Care [facility]) CM/SW Contact  Larrie Kass, LCSW Phone Number: 10/21/2023, 10:28 AM  Clinical Narrative:     CSW met with pt and pt's wife, they have changed their mind and would like CIR. Pt's wife reports she has spoke with CIR admission.   CIR will place a consult and will start insurance auth with [employer] . TOC to follow for d/c needs.     Expected Discharge Plan:  (TBD) Barriers to Discharge: Continued Medical Work up  Expected Discharge Plan and Services       Living arrangements for the past 2 months: Single Family Home                                       Social Determinants of Health (SDOH) Interventions SDOH Screenings   Food Insecurity: No Food Insecurity (09/24/2023)  Housing: Low Risk  (09/25/2023)  Transportation Needs: No Transportation Needs (09/24/2023)  Utilities: Not At Risk (09/24/2023)  Social Connections: Patient Unable To Answer (09/27/2023)  Tobacco Use: Medium Risk (09/30/2023)    Readmission Risk Interventions    09/27/2023   10:04 AM  Readmission Risk Prevention Plan  Transportation Screening Complete  PCP or Specialist Appt within 5-7 Days Complete  Home Care Screening Complete  Medication Review (RN CM) Complete

## 2023-10-21 NOTE — Progress Notes (Signed)
[facility] IP rehab admissions - We have received insurance approval for acute inpatient rehab admission.  We do not currently have a rehab bed open for this patient.  I will follow up again tomorrow.  [phone]

## 2023-10-21 NOTE — Progress Notes (Signed)
  Progress Note   Patient: Joshua [name] [id number]:[id number] DOB: [birth year] DOA: 09/24/2023     27 DOS: the patient was seen and examined on 10/21/2023        Brief hospital course: 73 y.o. M with pAF on Pradaxa, CAD s/p PCI >5 yrs, DM, OSA, HTN, obesity, HLD, and CKD IIIa who presented with dizziness, melena.    In the ER, found to have GIB, AKI.  Praxbind given.  GI consulted, and EGD showed AVMs.  Then developed worsening hypoxia, found to have a loculated pleural effusion, chest tube was placed and eventually removed successfully.    Hospital course also complicated by persistent encephalopathy/delirium, now resolved.     Assessment and Plan: * Gastrointestinal hemorrhage with melena - Continue PPI - Outpatient follow-up with GI  Hemothorax on right Loculated pleural effusion - Outpatient Pulm follow up    Symptomatic anemia Acute blood loss anemia due to GI bleed Hemoglobin remains stable - Continue Pradaxa - Trend hemoglobin  Paroxysmal atrial fibrillation (HCC) Rate controlled - Continue Pradaxa, carvedilol  Coronary artery disease with chronic angina - Continue carvedilol, Imdur, Crestor  Essential hypertension Blood pressure normal - Continue carvedilol, Imdur  Hyperlipidemia - Hold fibrate - Continue Crestor  Type 2 diabetes mellitus (HCC) Glucose here normal off SSI - Hold Actos          Subjective: Patient is tired, he has no new complaints.  No vomiting, tolerating diet well, no new dyspnea, no nursing concerns.     Physical Exam: BP (!) 149/65   Pulse (!) 44   Temp 98.4 F (36.9 C) (Oral)   Resp 16   Ht 5\' 8"  (1.727 m)   Wt 102.6 kg   SpO2 94%   BMI 34.39 kg/m   Obese adult male, sitting up in bed, does not make eye contact, watching television RRR, no murmurs, no pitting edema Respiratory rate normal, lung sounds diminished Abdomen soft, no tenderness palpation Response to questions, affect blunted and flat, face symmetric,  speech fluent Generalized weakness, symmetric  Data Reviewed: Basic metabolic panel unremarkable CBC shows stable anemia  Family Communication: Wife at the bedside   Disposition: Status is: Inpatient Severe significantly debilitated and will require rehab to return to his prior independent level of function  He is now stabilized and medically ready for discharge when bed available       Author: Alberteen Sam, MD 10/21/2023 1:25 PM  For on call review [email or web address].

## 2023-10-21 NOTE — Progress Notes (Signed)
Physical Therapy Treatment Patient Details Name: Joshua [name] MRN: [id number] DOB: [birth year] Today's Date: 10/21/2023   History of Present Illness Patient is a 73 year old male who presented with SOB, melena and dizziness. Patient was admitted with symptomatic anemia secondary to GI bleed and AKI. Patient has thoracentesis on 12/31. PMH: a fib, CKD III, HTN, HLD, OSA, OA, CPAP.Right-sided hemothorax, loculated pleural effusion - chest tube was placed and eventually removed successfully.  Pulmonary following, status post lytic treatment with last dose 1/13. Pt was discharged from therapy services on 10/07/23 per MD request due to medical decline. New therapy orders received due to change in pt's status.    PT Comments  Pt assisted with pericare and rolling due to BM in bed.  Pt then requiring increased time, multimodal cues and at mod assit +2 for standing and transfer to recliner.  Pt also requiring some cues to remain awake and participate, eyes quickly close when not engaged.     If plan is discharge home, recommend the following: Assistance with cooking/housework;Assist for transportation;Help with stairs or ramp for entrance;Two people to help with walking and/or transfers;Two people to help with bathing/dressing/bathroom   Can travel by private vehicle     No  Equipment Recommendations  None recommended by PT    Recommendations for Other Services       Precautions / Restrictions Precautions Precautions: Fall Precaution Comments: continous spitting     Mobility  Bed Mobility Overal bed mobility: Needs Assistance Bed Mobility: Rolling, Supine to Sit Rolling: +2 for physical assistance, Used rails, Mod assist Sidelying to sit: +2 for safety/equipment, +2 for physical assistance, Mod assist       General bed mobility comments: requiring multimodal cues and time to initiate; pt with loose BM in bed so assisted with pericare and rolling prior to OOB    Transfers Overall  transfer level: Needs assistance Equipment used: Rolling walker (2 wheels) Transfers: Sit to/from Stand Sit to Stand: Min assist, Mod assist, +2 physical assistance   Step pivot transfers: Mod assist, +2 physical assistance       General transfer comment: assist to rise and steady, cues for use of RW and small steps over to recliner, assist to control descent    Ambulation/Gait                   Stairs             Wheelchair Mobility     Tilt Bed    Modified Rankin (Stroke Patients Only)       Balance Overall balance assessment: Needs assistance         Standing balance support: During functional activity, Reliant on assistive device for balance, Bilateral upper extremity supported Standing balance-Leahy Scale: Poor Standing balance comment: steady assist  to stand at [employer] Arousal: Alert Behavior During Therapy: Flat affect Overall Cognitive Status: Impaired/Different from baseline Area of Impairment: Attention, Safety/judgement, Problem solving, Awareness, Following commands, Memory                   Current Attention Level: Focused Memory: Decreased short-term memory, Decreased recall of precautions Following Commands: Follows one step commands with increased time Safety/Judgement: Decreased awareness of deficits Awareness: Emergent Problem Solving: Slow processing General Comments: delayed processing, requires constant cues to perform and for technique  Exercises      General Comments        Pertinent Vitals/Pain Pain Assessment Pain Assessment: No/denies pain Pain Intervention(s): Monitored during session, Repositioned    Home Living                          Prior Function            PT Goals (current goals can now be found in the care plan section) Progress towards PT goals: Progressing toward goals    Frequency    Min 1X/week      PT Plan       Co-evaluation              AM-PAC PT "6 Clicks" Mobility   Outcome Measure  Help needed turning from your back to your side while in a flat bed without using bedrails?: A Lot Help needed moving from lying on your back to sitting on the side of a flat bed without using bedrails?: A Lot Help needed moving to and from a bed to a chair (including a wheelchair)?: A Lot Help needed standing up from a chair using your arms (e.g., wheelchair or bedside chair)?: A Lot Help needed to walk in hospital room?: A Lot Help needed climbing 3-5 steps with a railing? : Total 6 Click Score: 11    End of Session Equipment Utilized During Treatment: Gait belt Activity Tolerance: Patient limited by fatigue Patient left: in chair;with call bell/phone within reach;with family/visitor present Nurse Communication: Mobility status PT Visit Diagnosis: Difficulty in walking, not elsewhere classified (R26.2)     Time: 1610-9604 PT Time Calculation (min) (ACUTE ONLY): 23 min  Charges:    $Therapeutic Activity: 23-37 mins PT General Charges $$ ACUTE PT VISIT: 1 Visit                    Paulino Door, DPT Physical Therapist Acute Rehabilitation Services Office: [phone]    Janan Halter [place] 10/21/2023, 1:59 PM

## 2023-10-22 ENCOUNTER — Encounter (HOSPITAL_COMMUNITY): Payer: Self-pay

## 2023-10-22 ENCOUNTER — Encounter: Payer: Self-pay | Admitting: Pulmonary Disease

## 2023-10-22 DIAGNOSIS — K921 Melena: Secondary | ICD-10-CM | POA: Diagnosis not present

## 2023-10-22 LAB — VITAMIN B1: Vitamin B1 (Thiamine): 67.2 nmol/L (ref 66.5–200.0)

## 2023-10-22 LAB — CBC
HCT: 27.8 % — ABNORMAL LOW (ref 39.0–52.0)
Hemoglobin: 8.5 g/dL — ABNORMAL LOW (ref 13.0–17.0)
MCH: 29.4 pg (ref 26.0–34.0)
MCHC: 30.6 g/dL (ref 30.0–36.0)
MCV: 96.2 fL (ref 80.0–100.0)
Platelets: 176 10*3/uL (ref 150–400)
RBC: 2.89 MIL/uL — ABNORMAL LOW (ref 4.22–5.81)
RDW: 17.1 % — ABNORMAL HIGH (ref 11.5–15.5)
WBC: 4.7 10*3/uL (ref 4.0–10.5)
nRBC: 0 % (ref 0.0–0.2)

## 2023-10-22 NOTE — Telephone Encounter (Signed)
Mailed patient letter to schedule appointment

## 2023-10-22 NOTE — Progress Notes (Signed)
  Progress Note   Patient: Joshua [name] [id number]:[id number] DOB: [birth year] DOA: 09/24/2023     28 DOS: the patient was seen and examined on 10/22/2023 at 9:25 AM      Brief hospital course: 73 y.o. M with pAF on Pradaxa, CAD s/p PCI >5 yrs, DM, OSA, HTN, obesity, HLD, and CKD IIIa who presented with dizziness, melena.    In the ER, found to have GIB, AKI.  Praxbind given.  GI consulted, and EGD showed AVMs.  Then developed worsening hypoxia, found to have a loculated pleural effusion, chest tube was placed and eventually removed successfully.    Hospital course also complicated by persistent encephalopathy/delirium, now resolved.     Assessment and Plan: * Gastrointestinal hemorrhage with melena -Continue PPI  Hemothorax on right Loculated pleural effusion Resolved, see prior note   Symptomatic anemia Acute blood loss anemia due to GI bleed Resolved, see prior note - Continue Pradaxa    Paroxysmal atrial fibrillation (HCC) Rate controlled - Continue Pradaxa and carvedilol  Coronary artery disease with chronic angina Essential hypertension -Continue carvedilol, Imdur, Crestor   Type 2 diabetes mellitus (HCC) Glucose here normal off SSI - Hold Actos          Subjective: Patient has no complaints, nursing have no new no concerns     Physical Exam: BP 134/75   Pulse 84   Temp 98 F (36.7 C)   Resp 16   Ht 5\' 8"  (1.727 m)   Wt 102.6 kg   SpO2 98%   BMI 34.39 kg/m   Obese adult male, sitting in bed, interactive, but flat affect RRR, no murmurs, no JVD Respiratory rate normal, lungs clear without rales or wheezes   Data Reviewed: No new labs  Family Communication: Wife at the bedside    Disposition: Status is: Inpatient Patient is significantly debilitated and will require rehab to return to his prior level of function, medically ready for discharge when bed available        Author: Alberteen Sam, MD 10/22/2023 4:10 PM  For  on call review [email or web address].

## 2023-10-22 NOTE — Progress Notes (Signed)
Pt constantly tearing sheet off of the mattress.I had to replace the bottom sheet on the mattress 10x last night from where patient would pull it off from one side, even as I was standing in the room and had just replaced it. Pt would kick the foot of the bed

## 2023-10-22 NOTE — Progress Notes (Signed)
[facility] IP rehab admissions - I anticipate a bed to be available on Sunday on inpatient rehab for this patient.  Please have nurse call around 12 noon and check for bed availability and room number and give report to rehab nurse at [phone].  We will schedule [facility] transport.  Call me for questions.  [phone]

## 2023-10-22 NOTE — TOC Transition Note (Signed)
Transition of Care [facility]) - Discharge Note   Patient Details  Name: Joshua [name] MRN: [id number] Date of Birth: [birth year]  [facility] CM/SW Contact:  Larrie Kass, LCSW Phone Number: 10/22/2023, 10:01 AM   Clinical Narrative:    Pt was approved for IP rehab with CIR. No further TOC needs, TOC will sign off.    Final next level of care: IP Rehab Facility Barriers to Discharge:  (IP bed availability)   Patient Goals and CMS Choice Patient states their goals for this hospitalization and ongoing recovery are:: retrun home CMS Medicare.gov Compare Post Acute Care list provided to:: Patient Choice offered to / list presented to : Patient      Discharge Placement                       Discharge Plan and Services Additional resources added to the After Visit Summary for                                       Social Drivers of Health (SDOH) Interventions SDOH Screenings   Food Insecurity: No Food Insecurity (09/24/2023)  Housing: Low Risk  (09/25/2023)  Transportation Needs: No Transportation Needs (09/24/2023)  Utilities: Not At Risk (09/24/2023)  Social Connections: Patient Unable To Answer (09/27/2023)  Tobacco Use: Medium Risk (09/30/2023)     Readmission Risk Interventions    09/27/2023   10:04 AM  Readmission Risk Prevention Plan  Transportation Screening Complete  PCP or Specialist Appt within 5-7 Days Complete  Home Care Screening Complete  Medication Review (RN CM) Complete

## 2023-10-22 NOTE — Progress Notes (Signed)
  Daily Progress Note   Patient Name: Joshua [name]       Date: 10/22/2023 DOB: 1951/08/04  Age: 73 y.o. MRN#: [id number] Attending Physician: Alberteen Sam, * Primary Care Physician: [name], MD Admit Date: 09/24/2023 Length of Stay: 28 days  Reason for Consultation/Follow-up: Establishing goals of care  Subjective:   CC: Patient awake alert resting in bed, wife at bedside. Following up regarding complex medical decision making.   Subjective: Resting in bed, no distress. Wife at bedside.    Objective:   Vital Signs:  BP (!) 143/90 (BP Location: Right Arm)   Pulse (!) 52   Temp 97.6 F (36.4 C)   Resp 18   Ht 5\' 8"  (1.727 m)   Wt 102.6 kg   SpO2 90%   BMI 34.39 kg/m   Physical Exam: General: Awake alert not in distress Cardiovascular: regular Respiratory: no increased work of breathing noted, not in respiratory distress,  Neuro: awake alert no distress.   Imaging:  I personally reviewed recent imaging.   Assessment & Plan:   Assessment: Patient is a 73 year old M with a PMHx of PAF/flutter on Pradaxa, CAD s/p stents in 2009, DM-2, CKD-3A, OSA not on CPAP, HTN, HLD and osteoarthritis admitted on 09/23/24 for management of progressive generalized weakness, dizziness, dyspnea, melena, and edema for weeks. During hospitalization has received management for GI bleed, aspiration PNA, spontaneous hemothorax 2/2 coagulopathy s/p chest tube placement with lytic use, and delirium. Palliative medicine team consulted to assist with complex medical decision making.   Recommendations/Plan: # Complex medical decision making/goals of care:   CIR  -  Code Status: Limited: Do not attempt resuscitation (DNR) -DNR-LIMITED -Do Not Intubate/DNI   # Symptom management  -Odynophagia      -Continue oral viscous lidocaine as needed  # Psychosocial Support:  -Wife, daughter  # Discharge Planning: CIR, palliative staff also available at [facility] if needed.   Discussed  with: patient and wife  Thank you for allowing the palliative care team to participate in the care Lucio Edward.  low MDM Rosalin Hawking MD Palliative Care Provider PMT # [phone]  If patient remains symptomatic despite maximum doses, please call PMT at [phone] between 0700 and 1900. Outside of these hours, please call attending, as PMT does not have night coverage.  Personally spent 36 minutes in patient care including extensive chart review (labs, imaging, progress/consult notes, vital signs), medically appropraite exam, discussed with treatment team, education to patient, family, and staff, documenting clinical information, medication review and management, coordination of care, and available advanced directive documents.

## 2023-10-22 NOTE — Progress Notes (Signed)
PHARMACY - ANTICOAGULATION CONSULT NOTE  Pharmacy Consult for dabigatran (Pradaxa)  Indication: atrial fibrillation  Allergies  Allergen Reactions   Desipramine Hives and Itching   Niacin Itching    Patient Measurements: Height: 5\' 8"  (172.7 cm) Weight: 102.6 kg (226 lb 3.1 oz) IBW/kg (Calculated) : 68.4  Vital Signs: Temp: 97.6 F (36.4 C) (01/24 0500) BP: 143/90 (01/24 0500) Pulse Rate: 52 (01/24 0500)  Labs: Recent Labs    10/20/23 0439 10/21/23 0626 10/22/23 1120  HGB 7.9* 7.8* 8.5*  HCT 25.4* 25.6* 27.8*  PLT 150 155 176  LABPROT  --  15.8*  --   INR  --  1.2  --   CREATININE 1.29* 1.10  --     Estimated Creatinine Clearance: 68.4 mL/min (by C-G formula based on SCr of 1.1 mg/dL).  Assessment: Pt is a 1 yoM who presented on 12/27 with weakness, dizziness, melena. PMH significant for atrial fibrillation and anticoagulated with Pradaxa PTA. Pt found to have upper GIB requiring transfusion and reversal with both Praxbind and vitamin K on 09/25/23 and 09/27/23.  GI was consulted, pt underwent EGD on 09/30/23 revealing  2 gastric AVMs with intermittent oozing s/p APC.   Pt subsequently developed worsening hypoxia and imaging showed loculated moderate right pleural effusion. Chest tube was placed and patient received 3 doses of intrapleural lytics. Chest tube removed on 1/17.   Anticoagulation was held from admission on 09/24/23 until 10/13/23. On 1/15, patient was deemed safe to resume anticoagulation and was started on heparin drip. Pt tolerated UFH without further bleeding and on 10/19/23, pharmacy was consulted to transition UFH back to Pradaxa. At that time, pharmacy reached out to MD in regards to previous bleed requiring Praxbind to confirm resumption of same anticoagulant. MD discussed with GI > bleeding due to AVMs, ok to resume Pradaxa.   10/22/2023: CBC: Hgb 8.5 remains stable, slightly increased, Plt WNL SCr 1.1, CrCl 68 mL/min No reports of bleeding with  resumption of Pradaxa  Plan:  Continue home regimen of pradaxa 150 mg PO BID  Pharmacy to sign off. Please re-consult if needed.   Cindi Carbon, PharmD 10/22/23 12:20 PM

## 2023-10-22 NOTE — Plan of Care (Signed)
Problem: Education: Goal: Knowledge of General Education information will improve Description: Including pain rating scale, medication(s)/side effects and non-pharmacologic comfort measures Outcome: Not Progressing

## 2023-10-22 NOTE — H&P (Signed)
Physical Medicine and Rehabilitation Admission H&P    Chief Complaint  Patient presents with   Debility due to multiple medical issues complicated by delirium    HPI: Joshua [name] is a 73 year old male with history of A fib/A flutter on Pradaxa, CAD, T2DM, HTN, gout, chronic LBP, diverticulosis, CKD (plans for renal bx), elevated PSA who was admitted to [facility] on 09/24/23 with reports of progressive weakness, SOB and dizziness. Family reported melena X 2 weeks and work up revealed Hyponatremia with Na-128, acute renal failure w/BUN/SCr-126/4.45, Hgb noted to be 7.0 and platelets 148. He was received a liter of bicarb and one unit PRBC.  DOAC held and vitamin K administered due to reports of penile bleeding 12/28.  GI consulted with plans for EGD but he developed hypoxia with increased oxygen needs and CXR done revealing moderate right loculated pleural effusion.   He underwent thoracocentesis of 150 cc of dark blood fluid by Dr Chi at bedside on 12/31 and CT chest done revealing large right loculated pleural effusion concerning for clotted hemothorax. Right chest tube placed with removal of clotted 200-300 cc old blood and frequent saline flushes recommended.  He was treated with Vitamin K for INR- 9.5, praxibind X 2 doses as well as FFP and received additional 2 units PRBC.  EGD performed on 01/02 revealing tow gastric AVMs -one with active intermittent oozing which was treated with APC. Dr. Chales Abrahams recommended PPI bid X 4 weeks and resumption of AC on 01/04. Post procedure did have some blood as well as dark stools.  DOE improved and chest tube pulled and flushed with  minimal improvement.  Repeat Chest CT 01/04 showed complex right pleural gas and fluid collection -hemopneumothorax or empyema with extensive passive atelectasis of lung.  IR consulted and CT guided 14 Fr pigtail thoracostomy catheter placed by Dr. Milford Cage and patient started on IV Zosyn for purulent  as well as HT nebs with  pulmonary hygiene. Pradaxa held due to bloody drainage from tube. He had issues with epistasis as well as worsening of delirium with agitation requiring restraints for safety. He required fibrinolysis x 3 of clotted chest tube.  Palliative care consulted to discuss GOC and family decided on DNR. He was weaned down to RA and IV  heparin stated on 01/15 and chest tube removed on 01/14. As  H/H stable, pradaxa resume don 01/21 ,  He had worsening of delirium with spitting, refusal to take meds and tapping of fingers. CT head showed mild age related atrophy and no acute changes. Dr. Melynda Ripple consulted due to ongoing issues with delirium and EEG negative for seizures. He did sustain a fall without injuries reported on 01/21. Mentation continues to fluctuate and he is  noted to be deconditioned.  He is requiring +2 min to mod assist for SPT and family has decline SNF. Family requested CIR  due to functional decline. He was independent PTA and furniture walked.    ROS   Past Medical History:  Diagnosis Date   Arthritis    Atrial fibrillation (HCC)    Atrial flutter (HCC)    Back pain    CAD (coronary artery disease)    stents placed 2009   Complication of anesthesia    woke up during surgery and ablation   DM (diabetes mellitus) (HCC)    type 2   Elevated PSA    Gout    Hearing problem    HTN (hypertension)    Hyperlipidemia  Myocardial infarct, old 2010   Prostatitis    Renal insufficiency     Past Surgical History:  Procedure Laterality Date   CARDIAC CATHETERIZATION  11/07/07   with 2 stents   COLONOSCOPY     Electrophysiologic study and RF catheter ablation   of AV node reentrant tachycardia.     ESOPHAGOGASTRODUODENOSCOPY (EGD) WITH PROPOFOL N/A 09/30/2023   Procedure: ESOPHAGOGASTRODUODENOSCOPY (EGD) WITH PROPOFOL;  Surgeon: Lynann Bologna, MD;  Location: [facility];  Service: Gastroenterology;  Laterality: N/A;   EXTERNAL EAR SURGERY Right    cartilage   HOT HEMOSTASIS N/A  09/30/2023   Procedure: HOT HEMOSTASIS (ARGON PLASMA COAGULATION/BICAP);  Surgeon: Lynann Bologna, MD;  Location: [facility];  Service: Gastroenterology;  Laterality: N/A;   LEFT AND RIGHT HEART CATHETERIZATION WITH CORONARY/GRAFT ANGIOGRAM N/A 09/27/2014   Procedure: LEFT AND RIGHT HEART CATHETERIZATION WITH Isabel Caprice;  Surgeon: Lesleigh Noe, MD;  Location: [facility] CATH LAB;  Service: Cardiovascular;  Laterality: N/A;   LUMBAR LAMINECTOMY/DECOMPRESSION MICRODISCECTOMY Left 04/29/2018   Procedure: Left Lumbar Four-Five Laminectomy/Foraminotomy;  Surgeon: Tia Alert, MD;  Location: [facility] OR;  Service: Neurosurgery;  Laterality: Left;  Left Lumbar Four-Five Laminectomy/Foraminotomy   TOOTH EXTRACTION     VASECTOMY      Family History  Problem Relation Age of Onset   Hypertension Mother    Gout Mother    Alzheimer's disease Mother    Heart failure Father    Heart disease Brother    Cancer Brother        type unknown    Social History:  reports that he has never smoked. He has quit using smokeless tobacco.  His smokeless tobacco use included chew. He reports that he does not drink alcohol and does not use drugs.   Allergies  Allergen Reactions   Desipramine Hives and Itching   Niacin Itching    Medications Prior to Admission  Medication Sig Dispense Refill   carvedilol (COREG) 12.5 MG tablet TAKE 1 TABLET BY MOUTH TWICE A DAY 180 tablet 3   fenofibrate 160 MG tablet Take 160 mg by mouth daily.     ipratropium (ATROVENT) 0.06 % nasal spray Place 1 spray into both nostrils 3 (three) times daily as needed for rhinitis.     isosorbide mononitrate (IMDUR) 30 MG 24 hr tablet Take 30 mg by mouth daily.     nitroGLYCERIN (NITROSTAT) 0.4 MG SL tablet Place 0.4 mg under the tongue every 5 (five) minutes x 3 doses as needed for chest pain.     olmesartan (BENICAR) 40 MG tablet Take 1 tablet (40 mg total) by mouth daily. 90 tablet 3   pioglitazone (ACTOS) 15 MG tablet Take 15 mg by  mouth daily.     rosuvastatin (CRESTOR) 20 MG tablet Take 20 mg by mouth daily.     TYLENOL 8 HOUR ARTHRITIS PAIN 650 MG CR tablet Take 650-1,300 mg by mouth every 8 (eight) hours as needed for pain.     vitamin B-12 (CYANOCOBALAMIN) 500 MCG tablet Take 500 mcg by mouth daily.      Home: Home Living Family/patient expects to be discharged to:: Private residence Living Arrangements: Spouse/significant other Available Help at Discharge: Family, Available 24 hours/day Type of Home: House Home Access: Stairs to enter [employer] of Steps: 4 Entrance Stairs-Rails: None Home Layout: One level Bathroom Shower/Tub: Health visitor: Handicapped height Home Equipment: Cane - single point   Functional History: Prior Function Prior Level of Function : Independent/Modified Independent Mobility  Comments: walks with SPC at times, usually no AD, denies falls in past 6 months ADLs Comments: independent  Functional Status:  Mobility: Bed Mobility Overal bed mobility: Needs Assistance Bed Mobility: Rolling, Supine to Sit Rolling: +2 for physical assistance, Used rails, Mod assist Sidelying to sit: +2 for safety/equipment, +2 for physical assistance, Mod assist Supine to sit: Total assist, +2 for physical assistance, +2 for safety/equipment Sit to supine: Mod assist, +2 for physical assistance, +2 for safety/equipment General bed mobility comments: requiring multimodal cues and time to initiate; pt with loose BM in bed so assisted with pericare and rolling prior to OOB Transfers Overall transfer level: Needs assistance Equipment used: Rolling walker (2 wheels) Transfers: Sit to/from Stand Sit to Stand: Min assist, Mod assist, +2 physical assistance Bed to/from chair/wheelchair/BSC transfer type:: Step pivot Stand pivot transfers: Max assist, Total assist, +2 physical assistance, +2 safety/equipment Step pivot transfers: Mod assist, +2 physical assistance General  transfer comment: assist to rise and steady, cues for use of RW and small steps over to recliner, assist to control descent Ambulation/Gait Ambulation/Gait assistance: Mod assist, +2 physical assistance, +2 safety/equipment Gait Distance (Feet): 20 Feet Assistive device: Rolling walker (2 wheels) Gait Pattern/deviations: Step-to pattern, Staggering left, Staggering right General Gait Details: patient  required  multimodal cues to advance with RW, turn and  hold rail to sit down to toilet, cues and maxx support to stand from low toiet, then amb 6 ' to reclienr as patient appeared weak and pale, having dry heaves. Gait velocity: decr    ADL: ADL Overall ADL's : Needs assistance/impaired Eating/Feeding: Set up, Sitting Grooming: Total assistance, Bed level Grooming Details (indicate cue type and reason): attempted to participate in combing hair sitting EOB with HOH needed for reaching up to top of head. patient not engaged in task. Upper Body Bathing: Total assistance, Sitting Lower Body Bathing: Total assistance, +2 for physical assistance, Bed level Upper Body Dressing : Total assistance, Bed level Lower Body Dressing: Total assistance, +2 for physical assistance, Bed level Toilet Transfer: +2 for safety/equipment, +2 for physical assistance, Minimal assistance, Ambulation, Rolling walker (2 wheels) Toilet Transfer Details (indicate cue type and reason): to bathroom in room with increased time. wife noted to get in the way while trying to assess vitals and manage lines/leads. patient attempting to vomit upon sitting on commode. nurse made aware. HR up to 139 bpm during session Toileting- Clothing Manipulation and Hygiene: Sit to/from stand, Total assistance General ADL Comments: patient was more lethargic during session with daughter and wife hyper involved in session.  Cognition: Cognition Overall Cognitive Status: Impaired/Different from baseline Orientation Level: Oriented  X4 Cognition Arousal: Alert Behavior During Therapy: Flat affect Overall Cognitive Status: Impaired/Different from baseline Area of Impairment: Attention, Safety/judgement, Problem solving, Awareness, Following commands, Memory Orientation Level: Person Current Attention Level: Focused Memory: Decreased short-term memory, Decreased recall of precautions Following Commands: Follows one step commands with increased time Safety/Judgement: Decreased awareness of deficits Awareness: Emergent Problem Solving: Slow processing General Comments: delayed processing, requires constant cues to perform and for technique   Blood pressure 134/75, pulse 84, temperature 98 F (36.7 C), resp. rate 16, height 5\' 8"  (1.727 m), weight 102.6 kg, SpO2 98%. Physical Exam  Results for orders placed or performed during the hospital encounter of 09/24/23 (from the past 48 hours)  Basic metabolic panel     Status: Abnormal   Collection Time: 10/21/23  6:26 AM  Result Value Ref Range   Sodium 140 135 - 145  mmol/L   Potassium 3.6 3.5 - 5.1 mmol/L   Chloride 109 98 - 111 mmol/L   CO2 21 (L) 22 - 32 mmol/L   Glucose, Bld 102 (H) 70 - 99 mg/dL    Comment: Glucose reference range applies only to samples taken after fasting for at least 8 hours.   BUN 25 (H) 8 - 23 mg/dL   Creatinine, Ser 4.69 0.61 - 1.24 mg/dL   Calcium 7.9 (L) 8.9 - 10.3 mg/dL   GFR, Estimated >62 >95 mL/min    Comment: (NOTE) Calculated using the CKD-EPI Creatinine Equation (2021)    Anion gap 10 5 - 15    Comment: Performed at [facility], [address] [place], [place] [address]  CBC     Status: Abnormal   Collection Time: 10/21/23  6:26 AM  Result Value Ref Range   WBC 3.6 (L) 4.0 - 10.5 K/uL   RBC 2.60 (L) 4.22 - 5.81 MIL/uL   Hemoglobin 7.8 (L) 13.0 - 17.0 g/dL   HCT 24.4 (L) 01.0 - 27.2 %   MCV 98.5 80.0 - 100.0 fL   MCH 30.0 26.0 - 34.0 pg   MCHC 30.5 30.0 - 36.0 g/dL   RDW 53.6 (H) 64.4 - 03.4 %    Platelets 155 150 - 400 K/uL   nRBC 0.5 (H) 0.0 - 0.2 %    Comment: Performed at [facility], [address], [place], [place] [address]  Protime-INR     Status: Abnormal   Collection Time: 10/21/23  6:26 AM  Result Value Ref Range   Prothrombin Time 15.8 (H) 11.4 - 15.2 seconds   INR 1.2 0.8 - 1.2    Comment: (NOTE) INR goal varies based on device and disease states. Performed at [facility], [address], [place], [place] [address]   CBC     Status: Abnormal   Collection Time: 10/22/23 11:20 AM  Result Value Ref Range   WBC 4.7 4.0 - 10.5 K/uL   RBC 2.89 (L) 4.22 - 5.81 MIL/uL   Hemoglobin 8.5 (L) 13.0 - 17.0 g/dL   HCT 56.4 (L) 33.2 - 95.1 %   MCV 96.2 80.0 - 100.0 fL   MCH 29.4 26.0 - 34.0 pg   MCHC 30.6 30.0 - 36.0 g/dL   RDW 88.4 (H) 16.6 - 06.3 %   Platelets 176 150 - 400 K/uL   nRBC 0.0 0.0 - 0.2 %    Comment: Performed at [facility], [address], [place], [place] [address]   No results found.    Blood pressure 134/75, pulse 84, temperature 98 F (36.7 C), resp. rate 16, height 5\' 8"  (1.727 m), weight 102.6 kg, SpO2 98%.  Medical Problem List and Plan: 1. Functional deficits secondary to ***  -patient may *** shower  -ELOS/Goals: *** 2.  Antithrombotics: -DVT/anticoagulation:  Pharmaceutical: Other (comment)-_Pradaxa resumed on 01/21   -antiplatelet therapy: N/A 3. Pain Management: Tylenol prn.  4. Mood/Behavior/Sleep: LCSW to follow for evaluation and support.   -antipsychotic agents: N/A--did not tolerate Seroquel  5. Neuropsych/cognition: This patient is not capable of making decisions on his own behalf. 6. Skin/Wound Care: Routine pressure relief measures.  7. Fluids/Electrolytes/Nutrition: Monitor I/O check CMET in am 8. GIB/Duodenal AVM s/p APC:  To continue PPI BID X 4 weeks-->10/26/23 9. Right hemothorax s/p CT/fibrinolysis: Has been weaned off oxygen  --follow up with Dr. Irena Cords in 2  months w/repeat CT chest 10. Delirium/intermittent movement d/o:  Delirium precautions.  --Monitor sleep wake cycle.  11. PAF/SVT: HR improved with titrated of coreg to 12.5 mg bid. 12.  Hypokalemia/Hypomagnesemia: 13. HCAP: Treated with 7 day course of Zosyn 14. Gross hematuria: Has resolved--->to follow up with Dr. Annabell Howells on outpatient basis.  15. T2DM: Hgb A1c- 5.5. Diet controlled at this time  --has been off actos.  16. Acute on chronic renal failure:  BUN/SCr improving.  --Followed by Dr. Zetta Bills w/plan for renal bx prior to admission.   --encourage fluid intake.  17. Hx Pancytopenia:  Bone bx w/ ICUS (brewing myelodysplasia) per Dr. Leonides Schanz.     ***  Jacquelynn Cree, PA-C 10/22/2023

## 2023-10-23 DIAGNOSIS — K921 Melena: Secondary | ICD-10-CM | POA: Diagnosis not present

## 2023-10-23 LAB — CULTURE, BLOOD (ROUTINE X 2)
Culture: NO GROWTH
Culture: NO GROWTH

## 2023-10-23 NOTE — Plan of Care (Signed)
Patient no longer having any signs or symptoms of a GIB, last BM was 10/22/2023, tolerating his diet and his oral intake is good, patient bein followed by rehab and palliative, wife remains at bedside, POC and goals reviewed and time given for questions, patient handbook/guide at bedside.  Problem: Education: Goal: Knowledge of General Education information will improve Description: Including pain rating scale, medication(s)/side effects and non-pharmacologic comfort measures Outcome: Progressing   Problem: Health Behavior/Discharge Planning: Goal: Ability to manage health-related needs will improve Outcome: Progressing   Problem: Clinical Measurements: Goal: Ability to maintain clinical measurements within normal limits will improve Outcome: Progressing Goal: Will remain free from infection Outcome: Progressing Goal: Diagnostic test results will improve Outcome: Progressing Goal: Respiratory complications will improve Outcome: Progressing Goal: Cardiovascular complication will be avoided Outcome: Progressing   Problem: Activity: Goal: Risk for activity intolerance will decrease Outcome: Progressing   Problem: Nutrition: Goal: Adequate nutrition will be maintained Outcome: Progressing   Problem: Coping: Goal: Level of anxiety will decrease Outcome: Progressing   Problem: Elimination: Goal: Will not experience complications related to bowel motility Outcome: Progressing Goal: Will not experience complications related to urinary retention Outcome: Progressing   Problem: Pain Management: Goal: General experience of comfort will improve Outcome: Progressing   Problem: Safety: Goal: Ability to remain free from injury will improve Outcome: Progressing   Problem: Skin Integrity: Goal: Risk for impaired skin integrity will decrease Outcome: Progressing   Problem: Education: Goal: Ability to describe self-care measures that may prevent or decrease complications (Diabetes  Survival Skills Education) will improve Outcome: Progressing Goal: Individualized Educational Video(s) Outcome: Progressing   Problem: Coping: Goal: Ability to adjust to condition or change in health will improve Outcome: Progressing   Problem: Fluid Volume: Goal: Ability to maintain a balanced intake and output will improve Outcome: Progressing   Problem: Health Behavior/Discharge Planning: Goal: Ability to identify and utilize available resources and services will improve Outcome: Progressing Goal: Ability to manage health-related needs will improve Outcome: Progressing   Problem: Metabolic: Goal: Ability to maintain appropriate glucose levels will improve Outcome: Progressing   Problem: Nutritional: Goal: Maintenance of adequate nutrition will improve Outcome: Progressing Goal: Progress toward achieving an optimal weight will improve Outcome: Progressing   Problem: Skin Integrity: Goal: Risk for impaired skin integrity will decrease Outcome: Progressing   Problem: Tissue Perfusion: Goal: Adequacy of tissue perfusion will improve Outcome: Progressing

## 2023-10-23 NOTE — Progress Notes (Signed)
  Progress Note   Patient: Joshua [name] JYN:[id number] DOB: 1951-04-30 DOA: 09/24/2023     29 DOS: the patient was seen and examined on 10/23/2023 at 11:28AM      Brief hospital course: 73 y.o. M with pAF on Pradaxa, CAD s/p PCI >5 yrs, DM, OSA, HTN, obesity, HLD, and CKD IIIa who presented with GIB, loculated effusion.  Now pending discharge     Assessment and Plan: * Gastrointestinal hemorrhage with melena Resolved  Hemothorax on right Loculated pleural effusion Resolved  Symptomatic anemia Acute blood loss anemia due to GI bleed - Continue Pradaxa - Check CBC tomorrow  Eye pain I see no corneal foreign body, mild redness around the eye, no conjunctivitis or drainage. - Hot compress and monitor  Paroxysmal atrial fibrillation (HCC) Coronary artery disease with chronic angina Essential hypertension Hyperlipidemia - Continue Pradaxa carvedilol, Imdur, Crestor  Type 2 diabetes mellitus (HCC) Glucose here normal off SSI - Hold Actos          Subjective: Patient has irritation of his right eye.  He is irritable from being kept awake at night.  No fever, no respiratory symptoms, no nursing concerns.     Physical Exam: BP 120/60   Pulse 67   Temp 98.5 F (36.9 C) (Oral)   Resp (!) 22   Ht 5\' 8"  (1.727 m)   Wt 102.6 kg   SpO2 94%   BMI 34.39 kg/m   Obese adult male, lying in bed, interactive and appropriate RRR, no murmurs, no peripheral edema Respiratory rate normal, lungs clear without rales or wheezes Abdomen soft, tenderness palpation or guarding  Data Reviewed: Thiamine level normal Blood cultures no growth  Family Communication: Wife at the bedside    Disposition: Status is: Inpatient         Author: Alberteen Sam, MD 10/23/2023 3:10 PM  For on call review [email or web address].

## 2023-10-24 ENCOUNTER — Inpatient Hospital Stay (HOSPITAL_COMMUNITY)
Admission: AD | Admit: 2023-10-24 | Payer: Medicare HMO | Source: Intra-hospital | Admitting: Physical Medicine and Rehabilitation

## 2023-10-24 ENCOUNTER — Encounter (HOSPITAL_COMMUNITY): Payer: Self-pay | Admitting: Physical Medicine and Rehabilitation

## 2023-10-24 ENCOUNTER — Inpatient Hospital Stay (HOSPITAL_COMMUNITY)
Admission: AD | Admit: 2023-10-24 | Discharge: 2023-11-10 | DRG: 945 | Disposition: A | Discharge status: Other Acute Inpatient Hospital | Payer: Medicare HMO | Source: Intra-hospital | Attending: Physical Medicine and Rehabilitation | Admitting: Physical Medicine and Rehabilitation

## 2023-10-24 ENCOUNTER — Other Ambulatory Visit: Payer: Self-pay

## 2023-10-24 DIAGNOSIS — I471 Supraventricular tachycardia, unspecified: Secondary | ICD-10-CM | POA: Diagnosis present

## 2023-10-24 DIAGNOSIS — G4700 Insomnia, unspecified: Secondary | ICD-10-CM | POA: Diagnosis present

## 2023-10-24 DIAGNOSIS — F32A Depression, unspecified: Secondary | ICD-10-CM | POA: Diagnosis not present

## 2023-10-24 DIAGNOSIS — R140 Abdominal distension (gaseous): Secondary | ICD-10-CM | POA: Diagnosis not present

## 2023-10-24 DIAGNOSIS — E1122 Type 2 diabetes mellitus with diabetic chronic kidney disease: Secondary | ICD-10-CM | POA: Diagnosis not present

## 2023-10-24 DIAGNOSIS — I10 Essential (primary) hypertension: Secondary | ICD-10-CM | POA: Diagnosis present

## 2023-10-24 DIAGNOSIS — G9340 Encephalopathy, unspecified: Secondary | ICD-10-CM | POA: Diagnosis not present

## 2023-10-24 DIAGNOSIS — Z8701 Personal history of pneumonia (recurrent): Secondary | ICD-10-CM

## 2023-10-24 DIAGNOSIS — N179 Acute kidney failure, unspecified: Secondary | ICD-10-CM | POA: Diagnosis not present

## 2023-10-24 DIAGNOSIS — F09 Unspecified mental disorder due to known physiological condition: Secondary | ICD-10-CM

## 2023-10-24 DIAGNOSIS — Z888 Allergy status to other drugs, medicaments and biological substances status: Secondary | ICD-10-CM

## 2023-10-24 DIAGNOSIS — Z7902 Long term (current) use of antithrombotics/antiplatelets: Secondary | ICD-10-CM

## 2023-10-24 DIAGNOSIS — I252 Old myocardial infarction: Secondary | ICD-10-CM

## 2023-10-24 DIAGNOSIS — R197 Diarrhea, unspecified: Secondary | ICD-10-CM | POA: Diagnosis not present

## 2023-10-24 DIAGNOSIS — R918 Other nonspecific abnormal finding of lung field: Secondary | ICD-10-CM | POA: Diagnosis not present

## 2023-10-24 DIAGNOSIS — Z9889 Other specified postprocedural states: Secondary | ICD-10-CM

## 2023-10-24 DIAGNOSIS — N1330 Unspecified hydronephrosis: Secondary | ICD-10-CM | POA: Diagnosis present

## 2023-10-24 DIAGNOSIS — N1831 Chronic kidney disease, stage 3a: Secondary | ICD-10-CM | POA: Diagnosis present

## 2023-10-24 DIAGNOSIS — N281 Cyst of kidney, acquired: Secondary | ICD-10-CM | POA: Diagnosis not present

## 2023-10-24 DIAGNOSIS — R0989 Other specified symptoms and signs involving the circulatory and respiratory systems: Secondary | ICD-10-CM | POA: Diagnosis not present

## 2023-10-24 DIAGNOSIS — T50916A Underdosing of multiple unspecified drugs, medicaments and biological substances, initial encounter: Secondary | ICD-10-CM | POA: Diagnosis not present

## 2023-10-24 DIAGNOSIS — I129 Hypertensive chronic kidney disease with stage 1 through stage 4 chronic kidney disease, or unspecified chronic kidney disease: Secondary | ICD-10-CM | POA: Diagnosis present

## 2023-10-24 DIAGNOSIS — E119 Type 2 diabetes mellitus without complications: Secondary | ICD-10-CM

## 2023-10-24 DIAGNOSIS — Z6834 Body mass index (BMI) 34.0-34.9, adult: Secondary | ICD-10-CM

## 2023-10-24 DIAGNOSIS — E669 Obesity, unspecified: Secondary | ICD-10-CM | POA: Diagnosis not present

## 2023-10-24 DIAGNOSIS — T8383XD Hemorrhage of genitourinary prosthetic devices, implants and grafts, subsequent encounter: Secondary | ICD-10-CM

## 2023-10-24 DIAGNOSIS — D631 Anemia in chronic kidney disease: Secondary | ICD-10-CM | POA: Diagnosis present

## 2023-10-24 DIAGNOSIS — Z7984 Long term (current) use of oral hypoglycemic drugs: Secondary | ICD-10-CM

## 2023-10-24 DIAGNOSIS — I951 Orthostatic hypotension: Secondary | ICD-10-CM | POA: Diagnosis present

## 2023-10-24 DIAGNOSIS — R1030 Lower abdominal pain, unspecified: Secondary | ICD-10-CM | POA: Diagnosis not present

## 2023-10-24 DIAGNOSIS — D638 Anemia in other chronic diseases classified elsewhere: Secondary | ICD-10-CM | POA: Diagnosis not present

## 2023-10-24 DIAGNOSIS — Z8249 Family history of ischemic heart disease and other diseases of the circulatory system: Secondary | ICD-10-CM

## 2023-10-24 DIAGNOSIS — Z955 Presence of coronary angioplasty implant and graft: Secondary | ICD-10-CM | POA: Diagnosis not present

## 2023-10-24 DIAGNOSIS — I2510 Atherosclerotic heart disease of native coronary artery without angina pectoris: Secondary | ICD-10-CM | POA: Diagnosis present

## 2023-10-24 DIAGNOSIS — N200 Calculus of kidney: Secondary | ICD-10-CM | POA: Diagnosis not present

## 2023-10-24 DIAGNOSIS — Y846 Urinary catheterization as the cause of abnormal reaction of the patient, or of later complication, without mention of misadventure at the time of the procedure: Secondary | ICD-10-CM | POA: Diagnosis present

## 2023-10-24 DIAGNOSIS — R5381 Other malaise: Principal | ICD-10-CM | POA: Diagnosis present

## 2023-10-24 DIAGNOSIS — I480 Paroxysmal atrial fibrillation: Secondary | ICD-10-CM | POA: Diagnosis not present

## 2023-10-24 DIAGNOSIS — S41112D Laceration without foreign body of left upper arm, subsequent encounter: Secondary | ICD-10-CM

## 2023-10-24 DIAGNOSIS — E785 Hyperlipidemia, unspecified: Secondary | ICD-10-CM | POA: Diagnosis present

## 2023-10-24 DIAGNOSIS — K31819 Angiodysplasia of stomach and duodenum without bleeding: Secondary | ICD-10-CM

## 2023-10-24 DIAGNOSIS — Z87891 Personal history of nicotine dependence: Secondary | ICD-10-CM

## 2023-10-24 DIAGNOSIS — R7989 Other specified abnormal findings of blood chemistry: Secondary | ICD-10-CM | POA: Insufficient documentation

## 2023-10-24 DIAGNOSIS — Z781 Physical restraint status: Secondary | ICD-10-CM

## 2023-10-24 DIAGNOSIS — D469 Myelodysplastic syndrome, unspecified: Secondary | ICD-10-CM | POA: Diagnosis present

## 2023-10-24 DIAGNOSIS — K5900 Constipation, unspecified: Secondary | ICD-10-CM | POA: Diagnosis not present

## 2023-10-24 DIAGNOSIS — Z9181 History of falling: Secondary | ICD-10-CM

## 2023-10-24 DIAGNOSIS — S301XXD Contusion of abdominal wall, subsequent encounter: Secondary | ICD-10-CM

## 2023-10-24 DIAGNOSIS — F05 Delirium due to known physiological condition: Secondary | ICD-10-CM | POA: Diagnosis not present

## 2023-10-24 DIAGNOSIS — K8020 Calculus of gallbladder without cholecystitis without obstruction: Secondary | ICD-10-CM | POA: Diagnosis not present

## 2023-10-24 DIAGNOSIS — D709 Neutropenia, unspecified: Secondary | ICD-10-CM | POA: Insufficient documentation

## 2023-10-24 DIAGNOSIS — K921 Melena: Secondary | ICD-10-CM | POA: Diagnosis not present

## 2023-10-24 DIAGNOSIS — R441 Visual hallucinations: Secondary | ICD-10-CM | POA: Diagnosis not present

## 2023-10-24 DIAGNOSIS — R159 Full incontinence of feces: Secondary | ICD-10-CM | POA: Diagnosis not present

## 2023-10-24 DIAGNOSIS — S81811D Laceration without foreign body, right lower leg, subsequent encounter: Secondary | ICD-10-CM

## 2023-10-24 DIAGNOSIS — R109 Unspecified abdominal pain: Secondary | ICD-10-CM | POA: Diagnosis not present

## 2023-10-24 DIAGNOSIS — E876 Hypokalemia: Secondary | ICD-10-CM | POA: Diagnosis not present

## 2023-10-24 DIAGNOSIS — N400 Enlarged prostate without lower urinary tract symptoms: Secondary | ICD-10-CM | POA: Diagnosis present

## 2023-10-24 DIAGNOSIS — S61412D Laceration without foreign body of left hand, subsequent encounter: Secondary | ICD-10-CM

## 2023-10-24 DIAGNOSIS — Z66 Do not resuscitate: Secondary | ICD-10-CM | POA: Diagnosis not present

## 2023-10-24 DIAGNOSIS — D61818 Other pancytopenia: Secondary | ICD-10-CM | POA: Diagnosis not present

## 2023-10-24 DIAGNOSIS — G319 Degenerative disease of nervous system, unspecified: Secondary | ICD-10-CM | POA: Diagnosis present

## 2023-10-24 DIAGNOSIS — D649 Anemia, unspecified: Secondary | ICD-10-CM | POA: Diagnosis not present

## 2023-10-24 DIAGNOSIS — S41111D Laceration without foreign body of right upper arm, subsequent encounter: Secondary | ICD-10-CM

## 2023-10-24 DIAGNOSIS — R300 Dysuria: Secondary | ICD-10-CM | POA: Diagnosis not present

## 2023-10-24 DIAGNOSIS — N1832 Chronic kidney disease, stage 3b: Secondary | ICD-10-CM | POA: Diagnosis present

## 2023-10-24 DIAGNOSIS — Z820 Family history of epilepsy and other diseases of the nervous system: Secondary | ICD-10-CM

## 2023-10-24 DIAGNOSIS — R319 Hematuria, unspecified: Secondary | ICD-10-CM | POA: Diagnosis not present

## 2023-10-24 DIAGNOSIS — K31811 Angiodysplasia of stomach and duodenum with bleeding: Secondary | ICD-10-CM | POA: Diagnosis present

## 2023-10-24 DIAGNOSIS — E1169 Type 2 diabetes mellitus with other specified complication: Secondary | ICD-10-CM | POA: Diagnosis not present

## 2023-10-24 DIAGNOSIS — R350 Frequency of micturition: Secondary | ICD-10-CM | POA: Diagnosis not present

## 2023-10-24 DIAGNOSIS — S81812D Laceration without foreign body, left lower leg, subsequent encounter: Secondary | ICD-10-CM

## 2023-10-24 DIAGNOSIS — R110 Nausea: Secondary | ICD-10-CM | POA: Diagnosis not present

## 2023-10-24 DIAGNOSIS — G8929 Other chronic pain: Secondary | ICD-10-CM | POA: Diagnosis present

## 2023-10-24 DIAGNOSIS — K828 Other specified diseases of gallbladder: Secondary | ICD-10-CM | POA: Diagnosis not present

## 2023-10-24 DIAGNOSIS — I25118 Atherosclerotic heart disease of native coronary artery with other forms of angina pectoris: Secondary | ICD-10-CM | POA: Diagnosis present

## 2023-10-24 DIAGNOSIS — R4589 Other symptoms and signs involving emotional state: Secondary | ICD-10-CM | POA: Diagnosis present

## 2023-10-24 DIAGNOSIS — J90 Pleural effusion, not elsewhere classified: Secondary | ICD-10-CM | POA: Diagnosis not present

## 2023-10-24 DIAGNOSIS — R161 Splenomegaly, not elsewhere classified: Secondary | ICD-10-CM | POA: Diagnosis not present

## 2023-10-24 DIAGNOSIS — S61411D Laceration without foreign body of right hand, subsequent encounter: Secondary | ICD-10-CM

## 2023-10-24 DIAGNOSIS — Z91148 Patient's other noncompliance with medication regimen for other reason: Secondary | ICD-10-CM

## 2023-10-24 DIAGNOSIS — Z79899 Other long term (current) drug therapy: Secondary | ICD-10-CM

## 2023-10-24 DIAGNOSIS — R32 Unspecified urinary incontinence: Secondary | ICD-10-CM | POA: Diagnosis not present

## 2023-10-24 DIAGNOSIS — D696 Thrombocytopenia, unspecified: Secondary | ICD-10-CM | POA: Diagnosis present

## 2023-10-24 DIAGNOSIS — N329 Bladder disorder, unspecified: Secondary | ICD-10-CM

## 2023-10-24 LAB — CBC
HCT: 26.2 % — ABNORMAL LOW (ref 39.0–52.0)
Hemoglobin: 8.1 g/dL — ABNORMAL LOW (ref 13.0–17.0)
MCH: 30 pg (ref 26.0–34.0)
MCHC: 30.9 g/dL (ref 30.0–36.0)
MCV: 97 fL (ref 80.0–100.0)
Platelets: 173 10*3/uL (ref 150–400)
RBC: 2.7 MIL/uL — ABNORMAL LOW (ref 4.22–5.81)
RDW: 17.5 % — ABNORMAL HIGH (ref 11.5–15.5)
WBC: 4 10*3/uL (ref 4.0–10.5)
nRBC: 0 % (ref 0.0–0.2)

## 2023-10-24 LAB — COMPREHENSIVE METABOLIC PANEL
ALT: 11 U/L (ref 0–44)
AST: 24 U/L (ref 15–41)
Albumin: 2.4 g/dL — ABNORMAL LOW (ref 3.5–5.0)
Alkaline Phosphatase: 51 U/L (ref 38–126)
Anion gap: 10 (ref 5–15)
BUN: 23 mg/dL (ref 8–23)
CO2: 21 mmol/L — ABNORMAL LOW (ref 22–32)
Calcium: 7.9 mg/dL — ABNORMAL LOW (ref 8.9–10.3)
Chloride: 112 mmol/L — ABNORMAL HIGH (ref 98–111)
Creatinine, Ser: 1.33 mg/dL — ABNORMAL HIGH (ref 0.61–1.24)
GFR, Estimated: 56 mL/min — ABNORMAL LOW (ref >60)
Glucose, Bld: 115 mg/dL — ABNORMAL HIGH (ref 70–99)
Potassium: 3.1 mmol/L — ABNORMAL LOW (ref 3.5–5.1)
Sodium: 143 mmol/L (ref 135–145)
Total Bilirubin: 0.6 mg/dL (ref 0.0–1.2)
Total Protein: 5.5 g/dL — ABNORMAL LOW (ref 6.5–8.1)

## 2023-10-24 MED ORDER — CARVEDILOL 12.5 MG PO TABS
12.5000 mg | ORAL_TABLET | Freq: Two times a day (BID) | ORAL | Status: DC
Start: 2023-10-24 — End: 2023-11-05
  Administered 2023-10-24 – 2023-11-05 (×20): 12.5 mg via ORAL
  Filled 2023-10-24 (×23): qty 1

## 2023-10-24 MED ORDER — FOLIC ACID 1 MG PO TABS: 1.0000 mg | ORAL_TABLET | Freq: Every day | ORAL | Status: AC

## 2023-10-24 MED ORDER — ROSUVASTATIN CALCIUM 20 MG PO TABS
20.0000 mg | ORAL_TABLET | Freq: Every day | ORAL | Status: DC
  Administered 2023-10-26 – 2023-11-10 (×16): 20 mg via ORAL
  Filled 2023-10-24 (×17): qty 1

## 2023-10-24 MED ORDER — BOOST / RESOURCE BREEZE PO LIQD CUSTOM
1.0000 | Freq: Two times a day (BID) | ORAL | Status: DC
  Administered 2023-10-24 – 2023-11-07 (×6): 1 via ORAL

## 2023-10-24 MED ORDER — PROCHLORPERAZINE EDISYLATE 10 MG/2ML IJ SOLN
5.0000 mg | Freq: Four times a day (QID) | INTRAMUSCULAR | Status: DC | PRN
  Administered 2023-10-25: 10 mg via INTRAVENOUS
  Filled 2023-10-24: qty 2

## 2023-10-24 MED ORDER — TAMSULOSIN HCL 0.4 MG PO CAPS
0.4000 mg | ORAL_CAPSULE | Freq: Every day | ORAL | Status: DC
  Filled 2023-10-24: qty 1

## 2023-10-24 MED ORDER — LIDOCAINE VISCOUS HCL 2 % MT SOLN: 15.0000 mL | OROMUCOSAL | Status: DC | PRN

## 2023-10-24 MED ORDER — DABIGATRAN ETEXILATE MESYLATE 150 MG PO CAPS
150.0000 mg | ORAL_CAPSULE | Freq: Two times a day (BID) | ORAL | Status: DC
  Administered 2023-10-24 – 2023-11-09 (×30): 150 mg via ORAL
  Filled 2023-10-24 (×32): qty 1

## 2023-10-24 MED ORDER — GUAIFENESIN-DM 100-10 MG/5ML PO SYRP: 5.0000 mL | ORAL_SOLUTION | Freq: Four times a day (QID) | ORAL | Status: DC | PRN

## 2023-10-24 MED ORDER — ACETAMINOPHEN 325 MG PO TABS
325.0000 mg | ORAL_TABLET | ORAL | Status: DC | PRN
Start: 2023-10-24 — End: 2023-10-25
  Administered 2023-10-25 (×2): 650 mg via ORAL
  Filled 2023-10-24 (×3): qty 2

## 2023-10-24 MED ORDER — FOLIC ACID 1 MG PO TABS: 1.0000 mg | ORAL_TABLET | Freq: Every day | ORAL | Status: DC

## 2023-10-24 MED ORDER — MELATONIN 5 MG PO TABS
5.0000 mg | ORAL_TABLET | Freq: Every evening | ORAL | Status: DC | PRN
  Administered 2023-10-25 – 2023-10-26 (×3): 5 mg via ORAL
  Filled 2023-10-24 (×3): qty 1

## 2023-10-24 MED ORDER — HYDROCORTISONE 1 % EX CREA: TOPICAL_CREAM | CUTANEOUS | Status: DC | PRN

## 2023-10-24 MED ORDER — PROCHLORPERAZINE 25 MG RE SUPP: 12.5000 mg | Freq: Four times a day (QID) | RECTAL | Status: DC | PRN

## 2023-10-24 MED ORDER — PROCHLORPERAZINE MALEATE 5 MG PO TABS
5.0000 mg | ORAL_TABLET | Freq: Four times a day (QID) | ORAL | Status: DC | PRN
Start: 2023-10-24 — End: 2023-10-25
  Filled 2023-10-24: qty 1

## 2023-10-24 MED ORDER — ORAL CARE MOUTH RINSE: 15.0000 mL | OROMUCOSAL | Status: DC | PRN

## 2023-10-24 MED ORDER — BISACODYL 10 MG RE SUPP: 10.0000 mg | Freq: Every day | RECTAL | Status: DC | PRN

## 2023-10-24 MED ORDER — POTASSIUM CHLORIDE CRYS ER 20 MEQ PO TBCR
40.0000 meq | EXTENDED_RELEASE_TABLET | Freq: Once | ORAL | Status: AC
Start: 2023-10-24 — End: 2023-10-24
  Administered 2023-10-24: 40 meq via ORAL
  Filled 2023-10-24: qty 2

## 2023-10-24 MED ORDER — ALUM & MAG HYDROXIDE-SIMETH 200-200-20 MG/5ML PO SUSP
30.0000 mL | ORAL | Status: DC | PRN
  Administered 2023-11-06: 30 mL via ORAL
  Filled 2023-10-24 (×2): qty 30

## 2023-10-24 MED ORDER — HYDROXYZINE HCL 10 MG PO TABS
10.0000 mg | ORAL_TABLET | Freq: Three times a day (TID) | ORAL | Status: DC | PRN
  Administered 2023-10-25 – 2023-10-27 (×2): 10 mg via ORAL
  Filled 2023-10-24 (×2): qty 1

## 2023-10-24 MED ORDER — DIPHENHYDRAMINE HCL 25 MG PO CAPS
25.0000 mg | ORAL_CAPSULE | Freq: Four times a day (QID) | ORAL | Status: DC | PRN
  Administered 2023-10-25 (×2): 25 mg via ORAL
  Filled 2023-10-24 (×2): qty 1

## 2023-10-24 MED ORDER — FOLIC ACID 1 MG PO TABS
1.0000 mg | ORAL_TABLET | Freq: Every day | ORAL | Status: DC
  Administered 2023-10-26 – 2023-11-10 (×16): 1 mg via ORAL
  Filled 2023-10-24 (×17): qty 1

## 2023-10-24 MED ORDER — PANTOPRAZOLE SODIUM 40 MG PO TBEC: 40.0000 mg | DELAYED_RELEASE_TABLET | Freq: Two times a day (BID) | ORAL | Status: DC

## 2023-10-24 MED ORDER — PANTOPRAZOLE SODIUM 40 MG PO TBEC
40.0000 mg | DELAYED_RELEASE_TABLET | Freq: Two times a day (BID) | ORAL | Status: DC
  Administered 2023-10-24 – 2023-10-28 (×5): 40 mg via ORAL
  Filled 2023-10-24 (×7): qty 1

## 2023-10-24 MED ORDER — ISOSORBIDE MONONITRATE ER 30 MG PO TB24
30.0000 mg | ORAL_TABLET | Freq: Every day | ORAL | Status: DC
  Administered 2023-10-26 – 2023-11-10 (×16): 30 mg via ORAL
  Filled 2023-10-24 (×17): qty 1

## 2023-10-24 MED ORDER — SALINE SPRAY 0.65 % NA SOLN: 2.0000 | NASAL | Status: DC | PRN

## 2023-10-24 MED ORDER — MILK AND MOLASSES ENEMA: 1.0000 | Freq: Every day | RECTAL | Status: DC | PRN

## 2023-10-24 MED ORDER — TAMSULOSIN HCL 0.4 MG PO CAPS: 0.4000 mg | ORAL_CAPSULE | Freq: Every day | ORAL | Status: DC

## 2023-10-24 NOTE — Progress Notes (Signed)
Inpatient Rehabilitation Admission Medication Review by a Pharmacist  A complete drug regimen review was completed for this patient to identify any potential clinically significant medication issues.  High Risk Drug Classes Is patient taking? Indication by Medication  Antipsychotic No   Anticoagulant Yes Pradaxa: Afib  Antibiotic No   Opioid No   Antiplatelet No   Hypoglycemics/insulin No   Vasoactive Medication Yes Coreg, flomax, Imdur: CAD, HTN, angina, PAF, urinary symptoms  Chemotherapy No   Other Yes Folic acid: supplement Crestor: HLD Protonix: GERD Viscous lidocaine: Mouth pain Hydroxyzine: anxiety Tylenol: pain Benadryl: itching Bisacodyl, Milk and molasses enema: constipation  Compazine: nausea/vomiting Mylanta: indigestion Robitussin: cough Melatonin: sleep      Type of Medication Issue Identified Description of Issue Recommendation(s)  Drug Interaction(s) (clinically significant)     Duplicate Therapy     Allergy     No Medication Administration End Date     Incorrect Dose     Additional Drug Therapy Needed     Significant med changes from prior encounter (inform family/care partners about these prior to discharge). - New Flomax and Protonix inpatient - Meds prior to inpatient admission that were not restarted inpatient: Olmesartan, fenofibrate, atrovent nasal spray, nitroglycerin sublingual tab, pioglitazone, B-12 Restart or discontinue as appropriate. Communicate medication changes with patient/family at discharge  Other       Clinically significant medication issues were identified that warrant physician communication and completion of prescribed/recommended actions by midnight of the next day:  No   Time spent performing this drug regimen review (minutes): 90   Thank you for allowing pharmacy to be a part of this patient's care.   Signe Colt, PharmD 10/24/2023 1:30 PM    **Pharmacist phone directory can be found on [email or web address] listed under  [facility]**

## 2023-10-24 NOTE — Progress Notes (Signed)
Pt transferred to [facility] Inpatient rehab.Report was given to receiving nurse.Pt transported by [facility].Pt's wife notified.

## 2023-10-24 NOTE — H&P (Signed)
Physical Medicine and Rehabilitation Admission H&P        Chief Complaint  Patient presents with   Debility due to multiple medical issues complicated by delirium      HPI: Joshua [name] is a 73 year old R handed male with history of A fib/A flutter on Pradaxa, CAD, T2DM, HTN, gout, chronic LBP, diverticulosis, CKD (plans for renal bx), elevated PSA who was admitted to [facility] on 09/24/23 with reports of progressive weakness, SOB and dizziness. Family reported melena X 2 weeks and work up revealed Hyponatremia with Na-128, acute renal failure w/BUN/SCr-126/4.45, Hgb noted to be 7.0 and platelets 148. He was received a liter of bicarb and one unit PRBC.  DOAC held and vitamin K administered due to reports of penile bleeding 12/28.  GI consulted with plans for EGD but he developed hypoxia with increased oxygen needs and CXR done revealing moderate right loculated pleural effusion.    He underwent thoracocentesis of 150 cc of dark blood fluid by Dr Chi at bedside on 12/31 and CT chest done revealing large right loculated pleural effusion concerning for clotted hemothorax. Right chest tube placed with removal of clotted 200-300 cc old blood and frequent saline flushes recommended.  He was treated with Vitamin K for INR- 9.5, praxibind X 2 doses as well as FFP and received additional 2 units PRBC.  EGD performed on 01/02 revealing tow gastric AVMs -one with active intermittent oozing which was treated with APC. Dr. Chales Abrahams recommended PPI bid X 4 weeks and resumption of AC on 01/04. Post procedure did have some blood as well as dark stools.  DOE improved and chest tube pulled and flushed with  minimal improvement.   Repeat Chest CT 01/04 showed complex right pleural gas and fluid collection -hemopneumothorax or empyema with extensive passive atelectasis of lung.  IR consulted and CT guided 14 Fr pigtail thoracostomy catheter placed by Dr. Milford Cage and patient started on IV Zosyn for purulent  as well as  HT nebs with pulmonary hygiene. Pradaxa held due to bloody drainage from tube. He had issues with epistasis as well as worsening of delirium with agitation requiring restraints for safety. He required fibrinolysis x 3 of clotted chest tube.  Palliative care consulted to discuss GOC and family decided on DNR. He was weaned down to RA and IV  heparin stated on 01/15 and chest tube removed on 01/14. As  H/H stable, pradaxa resumed on 01/21 ,  He had worsening of delirium with spitting, refusal to take meds and tapping of fingers. CT head showed mild age related atrophy and no acute changes. Dr. Melynda Ripple consulted due to ongoing issues with delirium and EEG negative for seizures. He did sustain a fall without injuries reported on 01/21. Mentation continues to fluctuate and he is  noted to be deconditioned.  He is requiring +2 min to mod assist for SPT and family has decline SNF. Family requested CIR  due to functional decline. He was independent PTA and furniture walked.   Pt reports his main issue is he needs to have BM immediately and having abd pain as a result. He denies any other pain-      Review of Systems  Constitutional:  Positive for malaise/fatigue.  HENT: Negative.    Eyes: Negative.   Respiratory: Negative.    Cardiovascular:  Negative for chest pain and claudication.  Gastrointestinal:  Positive for abdominal pain. Negative for nausea and vomiting.       Having  stools- last BM at [facility] prior to d/c- needs to have BM now!  Genitourinary:  Positive for frequency and urgency. Negative for dysuria.  Musculoskeletal: Negative.   Skin: Negative.   Neurological:  Positive for weakness. Negative for sensory change and speech change.  Endo/Heme/Allergies: Negative.   All other systems reviewed and are negative.           Past Medical History:  Diagnosis Date   Arthritis     Atrial fibrillation (HCC)     Atrial flutter (HCC)     Back pain     CAD (coronary artery disease)       stents placed 2009   Complication of anesthesia      woke up during surgery and ablation   DM (diabetes mellitus) (HCC)      type 2   Elevated PSA     Gout     Hearing problem     HTN (hypertension)     Hyperlipidemia     Myocardial infarct, old 2010   Prostatitis     Renal insufficiency                 Past Surgical History:  Procedure Laterality Date   CARDIAC CATHETERIZATION   11/07/07    with 2 stents   COLONOSCOPY       Electrophysiologic study and RF catheter ablation   of AV node reentrant tachycardia.       ESOPHAGOGASTRODUODENOSCOPY (EGD) WITH PROPOFOL N/A 09/30/2023    Procedure: ESOPHAGOGASTRODUODENOSCOPY (EGD) WITH PROPOFOL;  Surgeon: Lynann Bologna, MD;  Location: [facility] ENDOSCOPY;  Service: Gastroenterology;  Laterality: N/A;   EXTERNAL EAR SURGERY Right      cartilage   HOT HEMOSTASIS N/A 09/30/2023    Procedure: HOT HEMOSTASIS (ARGON PLASMA COAGULATION/BICAP);  Surgeon: Lynann Bologna, MD;  Location: [facility];  Service: Gastroenterology;  Laterality: N/A;   LEFT AND RIGHT HEART CATHETERIZATION WITH CORONARY/GRAFT ANGIOGRAM N/A 09/27/2014    Procedure: LEFT AND RIGHT HEART CATHETERIZATION WITH Isabel Caprice;  Surgeon: Lesleigh Noe, MD;  Location: [facility] CATH LAB;  Service: Cardiovascular;  Laterality: N/A;   LUMBAR LAMINECTOMY/DECOMPRESSION MICRODISCECTOMY Left 04/29/2018    Procedure: Left Lumbar Four-Five Laminectomy/Foraminotomy;  Surgeon: Tia Alert, MD;  Location: [facility] OR;  Service: Neurosurgery;  Laterality: Left;  Left Lumbar Four-Five Laminectomy/Foraminotomy   TOOTH EXTRACTION       VASECTOMY                   Family History  Problem Relation Age of Onset   Hypertension Mother     Gout Mother     Alzheimer's disease Mother     Heart failure Father     Heart disease Brother     Cancer Brother          type unknown          Social History:  reports that he has never smoked. He has quit using smokeless tobacco.  His smokeless tobacco use  included chew. He reports that he does not drink alcohol and does not use drugs.     Allergies      Allergies  Allergen Reactions   Desipramine Hives and Itching   Niacin Itching              Medications Prior to Admission  Medication Sig Dispense Refill   carvedilol (COREG) 12.5 MG tablet TAKE 1 TABLET BY MOUTH TWICE A DAY 180 tablet 3   fenofibrate 160 MG tablet Take  160 mg by mouth daily.       ipratropium (ATROVENT) 0.06 % nasal spray Place 1 spray into both nostrils 3 (three) times daily as needed for rhinitis.       isosorbide mononitrate (IMDUR) 30 MG 24 hr tablet Take 30 mg by mouth daily.       nitroGLYCERIN (NITROSTAT) 0.4 MG SL tablet Place 0.4 mg under the tongue every 5 (five) minutes x 3 doses as needed for chest pain.       olmesartan (BENICAR) 40 MG tablet Take 1 tablet (40 mg total) by mouth daily. 90 tablet 3   pioglitazone (ACTOS) 15 MG tablet Take 15 mg by mouth daily.       rosuvastatin (CRESTOR) 20 MG tablet Take 20 mg by mouth daily.       TYLENOL 8 HOUR ARTHRITIS PAIN 650 MG CR tablet Take 650-1,300 mg by mouth every 8 (eight) hours as needed for pain.       vitamin B-12 (CYANOCOBALAMIN) 500 MCG tablet Take 500 mcg by mouth daily.              Home: Home Living Family/patient expects to be discharged to:: Private residence Living Arrangements: Spouse/significant other Available Help at Discharge: Family, Available 24 hours/day Type of Home: House Home Access: Stairs to enter Secretary/administrator of Steps: 4 Entrance Stairs-Rails: None Home Layout: One level Bathroom Shower/Tub: Health visitor: Handicapped height Home Equipment: Cane - single point   Functional History: Prior Function Prior Level of Function : Independent/Modified Independent Mobility Comments: walks with SPC at times, usually no AD, denies falls in past 6 months ADLs Comments: independent   Functional Status:  Mobility: Bed Mobility Overal bed mobility:  Needs Assistance Bed Mobility: Rolling, Supine to Sit Rolling: +2 for physical assistance, Used rails, Mod assist Sidelying to sit: +2 for safety/equipment, +2 for physical assistance, Mod assist Supine to sit: Total assist, +2 for physical assistance, +2 for safety/equipment Sit to supine: Mod assist, +2 for physical assistance, +2 for safety/equipment General bed mobility comments: requiring multimodal cues and time to initiate; pt with loose BM in bed so assisted with pericare and rolling prior to OOB Transfers Overall transfer level: Needs assistance Equipment used: Rolling walker (2 wheels) Transfers: Sit to/from Stand Sit to Stand: Min assist, Mod assist, +2 physical assistance Bed to/from chair/wheelchair/BSC transfer type:: Step pivot Stand pivot transfers: Max assist, Total assist, +2 physical assistance, +2 safety/equipment Step pivot transfers: Mod assist, +2 physical assistance General transfer comment: assist to rise and steady, cues for use of RW and small steps over to recliner, assist to control descent Ambulation/Gait Ambulation/Gait assistance: Mod assist, +2 physical assistance, +2 safety/equipment Gait Distance (Feet): 20 Feet Assistive device: Rolling walker (2 wheels) Gait Pattern/deviations: Step-to pattern, Staggering left, Staggering right General Gait Details: patient  required  multimodal cues to advance with RW, turn and  hold rail to sit down to toilet, cues and maxx support to stand from low toiet, then amb 6 ' to reclienr as patient appeared weak and pale, having dry heaves. Gait velocity: decr   ADL: ADL Overall ADL's : Needs assistance/impaired Eating/Feeding: Set up, Sitting Grooming: Total assistance, Bed level Grooming Details (indicate cue type and reason): attempted to participate in combing hair sitting EOB with HOH needed for reaching up to top of head. patient not engaged in task. Upper Body Bathing: Total assistance, Sitting Lower Body Bathing:  Total assistance, +2 for physical assistance, Bed level Upper Body Dressing : Total  assistance, Bed level Lower Body Dressing: Total assistance, +2 for physical assistance, Bed level Toilet Transfer: +2 for safety/equipment, +2 for physical assistance, Minimal assistance, Ambulation, Rolling walker (2 wheels) Toilet Transfer Details (indicate cue type and reason): to bathroom in room with increased time. wife noted to get in the way while trying to assess vitals and manage lines/leads. patient attempting to vomit upon sitting on commode. nurse made aware. HR up to 139 bpm during session Toileting- Clothing Manipulation and Hygiene: Sit to/from stand, Total assistance General ADL Comments: patient was more lethargic during session with daughter and wife hyper involved in session.   Cognition: Cognition Overall Cognitive Status: Impaired/Different from baseline Orientation Level: Oriented X4 Cognition Arousal: Alert Behavior During Therapy: Flat affect Overall Cognitive Status: Impaired/Different from baseline Area of Impairment: Attention, Safety/judgement, Problem solving, Awareness, Following commands, Memory Orientation Level: Person Current Attention Level: Focused Memory: Decreased short-term memory, Decreased recall of precautions Following Commands: Follows one step commands with increased time Safety/Judgement: Decreased awareness of deficits Awareness: Emergent Problem Solving: Slow processing General Comments: delayed processing, requires constant cues to perform and for technique     Blood pressure 134/75, pulse 84, temperature 98 F (36.7 C), resp. rate 16, height 5\' 8"  (1.727 m), weight 102.6 kg, SpO2 98%. Physical Exam Vitals and nursing note reviewed.  Constitutional:      Appearance: He is obese.     Comments: Sitting up in bed- awake, alert, insistent needs to have BM, NAD- has Purewick not attached to wall since just got here  HENT:     Head: Normocephalic and  atraumatic.     Right Ear: External ear normal.     Left Ear: External ear normal.     Nose: Nose normal. No congestion.     Mouth/Throat:     Mouth: Mucous membranes are dry.     Pharynx: Oropharynx is clear. No oropharyngeal exudate.  Eyes:     General:        Left eye: No discharge.     Extraocular Movements: Extraocular movements intact.  Cardiovascular:     Rate and Rhythm: Normal rate. Rhythm irregular.     Heart sounds: Normal heart sounds. No murmur heard.    No gallop.  Pulmonary:     Effort: Pulmonary effort is normal. No respiratory distress.     Breath sounds: Normal breath sounds. No wheezing, rhonchi or rales.  Abdominal:     Comments: Protuberant vs distended- hypoactive BS- but c/p TTP mildly throughout- no rebound  Genitourinary:    Comments: Has purewick, but not attached to wall- just got to unit Musculoskeletal:     Cervical back: Neck supple. No tenderness.     Comments: At least antigravity but was upset that did Strength exam- since focused on having BM In UE"s and LE's appears to be at least 4/5 in all muscles tested   Skin:    General: Skin is warm and dry.     Comments: A lot of skin tears on UE"s- covered with dressings LUE wrist IV- looks OK Hemosiderin straining and 1-2+ LE edema to mid calf B/L   Neurological:     Mental Status: He is alert.     Comments: Ox2-3- but appears slightly decreased mentation on exam Decreased to light touch from mid calf downwards B/L- chronic  Psychiatric:     Comments: frustrated        Lab Results Last 48 Hours        Results for orders placed  or performed during the hospital encounter of 09/24/23 (from the past 48 hours)  Basic metabolic panel     Status: Abnormal    Collection Time: 10/21/23  6:26 AM  Result Value Ref Range    Sodium 140 135 - 145 mmol/L    Potassium 3.6 3.5 - 5.1 mmol/L    Chloride 109 98 - 111 mmol/L    CO2 21 (L) 22 - 32 mmol/L    Glucose, Bld 102 (H) 70 - 99 mg/dL      Comment:  Glucose reference range applies only to samples taken after fasting for at least 8 hours.    BUN 25 (H) 8 - 23 mg/dL    Creatinine, Ser 1.61 0.61 - 1.24 mg/dL    Calcium 7.9 (L) 8.9 - 10.3 mg/dL    GFR, Estimated >09 >60 mL/min      Comment: (NOTE) Calculated using the CKD-EPI Creatinine Equation (2021)      Anion gap 10 5 - 15      Comment: Performed at [facility], [address] [place], [place] [address]  CBC     Status: Abnormal    Collection Time: 10/21/23  6:26 AM  Result Value Ref Range    WBC 3.6 (L) 4.0 - 10.5 K/uL    RBC 2.60 (L) 4.22 - 5.81 MIL/uL    Hemoglobin 7.8 (L) 13.0 - 17.0 g/dL    HCT 81.1 (L) 91.4 - 52.0 %    MCV 98.5 80.0 - 100.0 fL    MCH 30.0 26.0 - 34.0 pg    MCHC 30.5 30.0 - 36.0 g/dL    RDW 78.2 (H) 95.6 - 15.5 %    Platelets 155 150 - 400 K/uL    nRBC 0.5 (H) 0.0 - 0.2 %      Comment: Performed at [facility], [address] [place], [place] [address]  Protime-INR     Status: Abnormal    Collection Time: 10/21/23  6:26 AM  Result Value Ref Range    Prothrombin Time 15.8 (H) 11.4 - 15.2 seconds    INR 1.2 0.8 - 1.2      Comment: (NOTE) INR goal varies based on device and disease states. Performed at [facility], [address] [place], [place] [address]    CBC     Status: Abnormal    Collection Time: 10/22/23 11:20 AM  Result Value Ref Range    WBC 4.7 4.0 - 10.5 K/uL    RBC 2.89 (L) 4.22 - 5.81 MIL/uL    Hemoglobin 8.5 (L) 13.0 - 17.0 g/dL    HCT 69.6 (L) 29.5 - 52.0 %    MCV 96.2 80.0 - 100.0 fL    MCH 29.4 26.0 - 34.0 pg    MCHC 30.6 30.0 - 36.0 g/dL    RDW 28.4 (H) 13.2 - 15.5 %    Platelets 176 150 - 400 K/uL    nRBC 0.0 0.0 - 0.2 %      Comment: Performed at [facility], [address], [place], [place] [address]      Imaging Results (Last 48 hours)  No results found.         Blood pressure 134/75, pulse 84, temperature 98 F (36.7 C), resp. rate  16, height 5\' 8"  (1.727 m), weight 102.6 kg, SpO2 98%.   Medical Problem List and Plan: 1. Functional deficits secondary to debility from GI bleed/delirium             -  patient may  shower- cover skin tears             -ELOS/Goals: 12-14 days min A to supervision             Admit to CIR 2.  Antithrombotics: -DVT/anticoagulation:  Pharmaceutical: Other (comment)-_Pradaxa resumed on 01/21              -antiplatelet therapy: N/A 3. Pain Management: Tylenol prn.  4. Mood/Behavior/Sleep: LCSW to follow for evaluation and support.              -antipsychotic agents: N/A--did not tolerate Seroquel  5. Neuropsych/cognition: This patient is not capable of making decisions on his own behalf. 6. Skin/Wound Care: Routine pressure relief measures.  7. Fluids/Electrolytes/Nutrition: Monitor I/O check CMET in am 8. GIB/Duodenal AVM s/p APC:  To continue PPI BID X 4 weeks-->10/26/23 9. Right hemothorax s/p CT/fibrinolysis: Has been weaned off oxygen             --follow up with Dr. Irena Cords in 2 months w/repeat CT chest 10. Delirium/intermittent movement d/o: Delirium precautions.  --Monitor sleep wake cycle.  11. PAF/SVT: HR improved with titrated of coreg to 12.5 mg bid. 12.  Hypokalemia/Hypomagnesemia: K+ 3.1 this AM- was given KCL- will recheck in AM 13. HCAP: Treated with 7 day course of Zosyn 14. Gross hematuria: Has resolved--->to follow up with Dr. Annabell Howells on outpatient basis.  15. T2DM: Hgb A1c- 5.5. Diet controlled at this time             --has been off actos.  16. Acute on chronic renal failure:  BUN/SCr improving.  --Followed by Dr. Zetta Bills w/plan for renal bx prior to admission.              --encourage fluid intake- Cr 1.33 this AM up from 1.0- will recheck in Ama nd push fluids.  17. Hx Pancytopenia:  Bone bx w/ ICUS (brewing myelodysplasia) per Dr. Leonides Schanz.            Jacquelynn Cree, PA-C 10/22/2023     I have personally performed a face to face diagnostic evaluation of this  patient and formulated the key components of the plan.  Additionally, I have personally reviewed laboratory data, imaging studies, as well as relevant notes and concur with the physician assistant's documentation above.   The patient's status has not changed from the original H&P.  Any changes in documentation from the acute care chart have been noted above.

## 2023-10-24 NOTE — Discharge Summary (Signed)
Physician Discharge Summary   Patient: Joshua [name] MRN: [id number] DOB: [birth year]  Admit date:     09/24/2023  Discharge date: 10/24/23  Discharge Physician: Alberteen Sam   PCP: [name], MD     Recommendations at discharge:  Follow up with PCP Dr. Eloise Harman 1 week after CIR discharge re: GIB, Pradaxa, hypertension, and loculated effusion Dr. Eloise Harman: Please resume blood pressure medicines as appropriate Ensure Pulmonology, GI, and Urology follows Discontinue new Flomax if appropriate  Follow up with GI Dr. Marina Goodell for upper GI bleeding Follow up with Urology Dr. Annabell Howells for transient gross hematuria Follow up with Pulmonology Dr. Francine Graven for loculated effusion, s/p chest tube CT chest planned in 2 months with Pulmonology  Inpatient rehab: Please obtain CBC and BMP in 1 week (discharge Hgb 8.1, Cr 1.3, K 3.1)     Discharge Diagnoses: Principal Problem:   Gastrointestinal hemorrhage with melena Active Problems:   Hemothorax on right   Symptomatic anemia   Type 2 diabetes mellitus (HCC)   Hyperlipidemia   Essential hypertension   Coronary artery disease with chronic angina   Paroxysmal atrial fibrillation (HCC)   Pulmonary hypertension (HCC)   Weight loss   Thrombocytopenia (HCC)   Hyponatremia   AKI (acute kidney injury) (HCC)   CKD stage 3a, GFR 45-59 ml/min (HCC)   Acute respiratory failure with hypoxia (HCC)   Gross hematuria   Morbid obesity (HCC)   Coagulopathy (HCC)   Hypokalemia      Hospital Course: 73 y.o. M with pAF on Pradaxa, CAD s/p PCI >5 yrs, DM, OSA, HTN, obesity, HLD, and CKD IIIa who presented with dizziness, melena.    In the ER, found to have GIB, AKI.  Praxbind given.  GI consulted, and EGD showed AVMs.  Then developed worsening hypoxia, found to have a loculated pleural effusion, chest tube was placed and eventually removed successfully.    Hospital course also complicated by persistent encephalopathy/delirium, now  resolved.       * Gastrointestinal hemorrhage with melena Symptomatic anemia Acute blood loss anemia due to GI bleed Treated with Praxbind, vitamin K, FFP, 2 units of packed red blood cells.  Admitted and GI consulted.    He underwent EGD that showed 2 gastric AVMs with active intermittent oozing.  This was treated with APC.  Post-operatively, his Pradaxa was resumed and his hemoglobin remained stable around 8  Follow-up with GI.  Continue Protonix twice daily for 4 weeks then daily indefinitely     Loculated pleural effusion Possible spontaneous hemothorax due to INR 9.5 Possible parapneumonic effusion Initially had a chest tube placed 1/1 which had no output and was removed.  Had second chest tube placed 1/6, CT surgery and pulmonology were consulted, the patient was given lytic treatment x2, and the chest tube was removed 1/17  Cytology negative, completed 7 days antibiotics therapy, and remained afebrile and asymptomatic afterwards - Outpatient CT in 2 months and pulm follow-up   Coagulopathy (HCC) INR 9.5 on admission, S/p Praxbind, vitamin K and FFP.   Coagulopathy resolved.  Unclear cause.  Morbid obesity (HCC) History of BMI >35 with comorbidities  Gross hematuria Incidental finding in the setting of coagulopathy.  Hospitalist team discussed with Dr. Annabell Howells on 12/30 who recommended outpatient neurology follow-up.     CKD stage 3a, GFR 45-59 ml/min (HCC) Cr stable relative to baseline 1.2  AKI (acute kidney injury) (HCC) Resolved  Hyponatremia Resolved  Thrombocytopenia (HCC) Resolved  Weight loss Malnutrition ruled out  Paroxysmal atrial fibrillation (HCC) Resumed Pradaxa, rate controlled  Coronary artery disease with chronic angina Essential hypertension Hyperlipidemia Stable on carvedilol, Imdur, Crestor  Type 2 diabetes mellitus (HCC) Glucose here normal off SSI, Actos resumed at discharge            [facility] Controlled  Substances Registry was reviewed for this patient prior to discharge.  Consultants: GI Neurology Palliative care Pulmonology  Procedures performed:  Chest tube insertion Lytic installation EGD  Disposition:  Inpatient rehab Diet recommendation:  Discharge Diet Orders (From admission, onward)     Start     Ordered   10/24/23 0000  Diabetic, heart healthy      10/24/23 0927             DISCHARGE MEDICATION: Allergies as of 10/24/2023       Reactions   Desipramine Hives, Itching   Niacin Itching        Medication List     PAUSE taking these medications    olmesartan 40 MG tablet Wait to take this until your doctor or other care provider tells you to start again. Commonly known as: BENICAR Take 1 tablet (40 mg total) by mouth daily.       TAKE these medications    carvedilol 12.5 MG tablet Commonly known as: COREG TAKE 1 TABLET BY MOUTH TWICE A DAY   fenofibrate 160 MG tablet Take 160 mg by mouth daily.   folic acid 1 MG tablet Commonly known as: FOLVITE Take 1 tablet (1 mg total) by mouth daily. Start taking on: October 25, 2023   ipratropium 0.06 % nasal spray Commonly known as: ATROVENT Place 1 spray into both nostrils 3 (three) times daily as needed for rhinitis.   isosorbide mononitrate 30 MG 24 hr tablet Commonly known as: IMDUR Take 30 mg by mouth daily.   nitroGLYCERIN 0.4 MG SL tablet Commonly known as: NITROSTAT Place 0.4 mg under the tongue every 5 (five) minutes x 3 doses as needed for chest pain.   pantoprazole 40 MG tablet Commonly known as: Protonix Take 1 tablet (40 mg total) by mouth 2 (two) times daily before a meal.   pioglitazone 15 MG tablet Commonly known as: ACTOS Take 15 mg by mouth daily.   Pradaxa 150 MG Caps capsule Generic drug: dabigatran TAKE 1 CAPSULE BY MOUTH TWICE A DAY   rosuvastatin 20 MG tablet Commonly known as: CRESTOR Take 20 mg by mouth daily.   tamsulosin 0.4 MG Caps capsule Commonly known  as: FLOMAX Take 1 capsule (0.4 mg total) by mouth daily. Start taking on: October 25, 2023   Tylenol 8 Hour Arthritis Pain 650 MG CR tablet Generic drug: acetaminophen Take 650-1,300 mg by mouth every 8 (eight) hours as needed for pain.   vitamin B-12 500 MCG tablet Commonly known as: CYANOCOBALAMIN Take 500 mcg by mouth daily.               Durable Medical Equipment  (From admission, onward)           Start     Ordered   10/06/23 0500  For home use only DME Walker rolling  Once       Question Answer Comment  Walker: With 5 Inch Wheels   Patient needs a walker to treat with the following condition General weakness      10/05/23 1031            Follow-up Information     [name], MD  Follow up.   Specialty: Internal Medicine Why: Schedule within 1 week of discharge from rehab Contact information: [address] [place] [address] [phone]         Martina Sinner, MD. Schedule an appointment as soon as possible for a visit in 2 month(s).   Specialty: Pulmonary Disease Why: Make an appointment to follow up the pleural effusion in 2 months, around mid-March Contact information: [address] [place] [address] [phone]         Hilarie Fredrickson, MD. Schedule an appointment as soon as possible for a visit in 1 month(s).   Specialty: Gastroenterology Why: Schedule an appointment in 1-2 months for follow up of intestinal bleeding Contact information: [address] [place] [address] [phone]         Bjorn Pippin, MD. Schedule an appointment as soon as possible for a visit in 1 month(s).   Specialty: Urology Why: Call for Urology follow up of blood in urine Contact information: [address] [place] [address] [phone]                 Discharge Instructions     Diet - low sodium heart healthy   Complete by: As directed    Discharge instructions   Complete by: As  directed    **IMPORTANT DISCHARGE INSTRUCTIONS**   From Dr. Maryfrances Bunnell: You were admitted for dizziness and dark stools. The dark stools appeared to be from intestinal bleeding  You had an upper endoscopy (EGD procedure to look in the stomach) with Dr. Chales Abrahams.  Thankfully this resolved, and your blood levels have been stable since resuming Pradaxa.  Take pantoprazole 40 mg twice daily for 1 month then reduce to once daily This suppresses acid to allow irritation and ulcers to heal and prevent new ones from forming Go see Dr. Marina Goodell in the office to follow up   You also were found to have a large pleural effusion (collection of fluid around the lung) Thankfully, this improved as much as possible, with a chest tube  Go see Dr. Francine Graven, the lung specialist in his office in 2 months He will want a CT chest around that time   Lastly, you were noticed to have some blood in your urine as well This may be nothing, but it should be followed up in the Urology office Go see the bladder and kidney surgeon Dr. Annabell Howells in his office in 1-2 months Take tamsulosin/Flomax in the meantime to prevent the prostate from blocking the urine  Ask Dr. Annabell Howells if you can stop Flomax   Increase activity slowly   Complete by: As directed    No wound care   Complete by: As directed        Discharge Exam: Filed Weights   10/17/23 0600 10/19/23 0500 10/20/23 0500  Weight: 100.2 kg 103 kg 102.6 kg    General: Pt is alert, awake, not in acute distress, flat affect, sitting up in bed Cardiovascular: RRR, nl S1-S2, no murmurs appreciated.   No LE edema.   Respiratory: Normal respiratory rate and rhythm.  CTAB without rales or wheezes. Abdominal: Abdomen soft and non-tender.  No distension or HSM.   Neuro/Psych: Strength symmetric in upper extremities, generally weak.  Judgment and insight appear normal.   Condition at discharge: fair  The results of significant diagnostics from this hospitalization  (including imaging, microbiology, ancillary and laboratory) are listed below for reference.   Imaging Studies: CT HEAD WO CONTRAST (  ) Result Date: 10/18/2023 CLINICAL DATA:  Initial evaluation for mental status change, unknown cause. EXAM: CT HEAD WITHOUT CONTRAST TECHNIQUE: Contiguous axial images were obtained from the base of the skull through the vertex without intravenous contrast. RADIATION DOSE REDUCTION: This exam was performed according to the departmental dose-optimization program which includes automated exposure control, adjustment of the mA and/or kV according to patient size and/or use of iterative reconstruction technique. COMPARISON:  Prior CT from 10/08/2023. FINDINGS: Brain: Mild age-related cerebral atrophy with chronic small vessel ischemic disease. No acute intracranial hemorrhage. No acute large vessel territory infarct. No mass lesion, midline shift or mass effect. No hydrocephalus or extra-axial fluid collection. Vascular: No abnormal hyperdense vessel. Calcified atherosclerosis present at the skull base. Skull: Scalp soft tissues within normal limits.  Calvarium intact. Sinuses/Orbits: Globes and orbital soft tissues within normal limits. Paranasal sinuses are clear. No mastoid effusion. Other: None. IMPRESSION: 1. No acute intracranial abnormality. 2. Mild age-related cerebral atrophy with chronic small vessel ischemic disease. Electronically Signed   By: Rise Mu M.D.   On: 10/18/2023 20:18   DG CHEST PORT 1 VIEW Result Date: 10/18/2023 CLINICAL DATA:  Fever. EXAM: PORTABLE CHEST 1 VIEW COMPARISON:  Chest x-ray dated October 16, 2023. FINDINGS: Stable cardiomediastinal silhouette. Normal pulmonary vascularity. Unchanged small loculated right pleural effusion with fluid tracking along the upper mediastinum and minor fissure. Similar right hemithorax volume loss with elevation of the right hemidiaphragm. No pneumothorax. No acute osseous abnormality. IMPRESSION: 1.  Unchanged small loculated right pleural effusion. Electronically Signed   By: Obie Dredge M.D.   On: 10/18/2023 11:49   EEG adult Result Date: 10/18/2023 Charlsie Quest, MD     10/18/2023 11:48 AM Patient Name: Joshua [name] MRN: [id number] Epilepsy Attending: Charlsie Quest Referring Physician/Provider: Leatha Gilding, MD Date: 10/18/2023 Duration: 25.28 mins Patient history: 73yo M with ams getting eeg to evaluate for seizure Level of alertness: Awake, asleep AEDs during EEG study: None Technical aspects: This EEG study was done with scalp electrodes positioned according to the 10-20 International system of electrode placement. Electrical activity was reviewed with band pass filter of 1-70Hz , sensitivity of 7 uV/mm, display speed of 58mm/sec with a 60Hz  notched filter applied as appropriate. EEG data were recorded continuously and digitally stored.  Video monitoring was available and reviewed as appropriate. Description: The posterior dominant rhythm consists of 8 Hz activity of moderate voltage (25-35 uV) seen predominantly in posterior head regions, symmetric and reactive to eye opening and eye closing. Sleep was characterized by vertex waves, sleep spindles (12 to 14 Hz), maximal frontocentral region. Physiologic photic driving was not seen during photic stimulation. Hyperventilation was not performed.   IMPRESSION: This study is within normal limits. No seizures or epileptiform discharges were seen throughout the recording. A normal interictal EEG does not exclude the diagnosis of epilepsy. Charlsie Quest   DG Chest Port 1 View Result Date: 10/16/2023 CLINICAL DATA:  73 year old male with history of pleural effusion. EXAM: PORTABLE CHEST 1 VIEW COMPARISON:  Chest x-ray 10/15/2023. FINDINGS: Previously noted small bore right-sided chest tube has been removed. Elevation of the right hemidiaphragm again noted. Lung volumes are normal. No confluent consolidative airspace disease. Small right  pleural effusion, some of which tracks along the right chest wall and into the minor fissure, similar to the prior study. No left pleural effusion. No pneumothorax. No evidence of pulmonary edema. Heart size appears borderline enlarged. Prominence of the right paratracheal contour, similar to the prior study,  likely reflecting residual loculated pleural fluid. IMPRESSION: 1. Interval removal of right-sided chest tube with no evidence of pneumothorax. 2. Persistent small right pleural effusion, some of which appears loculated, as above. 3. Borderline cardiomegaly. Electronically Signed   By: Trudie Reed M.D.   On: 10/16/2023 08:04   DG CHEST PORT 1 VIEW Result Date: 10/15/2023 CLINICAL DATA:  Pleural effusion EXAM: PORTABLE CHEST 1 VIEW COMPARISON:  10/08/2023, CT 10/10/2023 FINDINGS: Left lung is grossly clear. Cardiomegaly. Small right-sided pleural effusion with loculation. Stable positioning of right lung base chest tube. No appreciable pneumothorax on this exam IMPRESSION: Stable positioning of right lung base chest tube. No appreciable pneumothorax on this exam. Small right-sided pleural effusion with loculation without great interval change compared to prior radiograph. Electronically Signed   By: Jasmine Pang M.D.   On: 10/15/2023 19:10   CT CHEST W CONTRAST Result Date: 10/10/2023 CLINICAL DATA:  Spontaneous hemothorax follow-up status post chest tube drainage. EXAM: CT CHEST WITH CONTRAST TECHNIQUE: Multidetector CT imaging of the chest was performed during intravenous contrast administration. RADIATION DOSE REDUCTION: This exam was performed according to the departmental dose-optimization program which includes automated exposure control, adjustment of the mA and/or kV according to patient size and/or use of iterative reconstruction technique. CONTRAST:  75mL OMNIPAQUE IOHEXOL 300 MG/ML  SOLN COMPARISON:  Chest x-ray dated October 08, 2023. CT chest dated October 02, 2023. FINDINGS:  Cardiovascular: Unchanged mild cardiomegaly. No pericardial effusion. No thoracic aortic aneurysm or dissection. Coronary, aortic arch, and branch vessel atherosclerotic vascular disease. No pulmonary embolism. Mediastinum/Nodes: Prominent subcentimeter mediastinal and hilar lymph nodes are unchanged and likely reactive. Thyroid gland, trachea, and esophagus demonstrate no significant findings. Lungs/Pleura: Right basilar pigtail chest tube in place. Small hemopneumothorax has decreased in size since the prior CT, with slightly smaller complex loculated components medially along the mediastinum and posterior right upper lobe (series 3, image 46). Small residual complex fluid at the right lung base near the chest tube. Improved aeration of the right lower lobe. No consolidation. Upper Abdomen: No acute abnormality. Musculoskeletal: No chest wall abnormality. No acute or significant osseous findings. IMPRESSION: 1. Right basilar pigtail chest tube in place. Small residual hemopneumothorax, decreased in size since the prior CT. 2. Improved aeration of the right lower lobe. 3.  Aortic Atherosclerosis (ICD10-I70.0). Electronically Signed   By: Obie Dredge M.D.   On: 10/10/2023 18:29   CT HEAD WO CONTRAST ( ) Result Date: 10/08/2023 CLINICAL DATA:  Altered mental status EXAM: CT HEAD WITHOUT CONTRAST TECHNIQUE: Contiguous axial images were obtained from the base of the skull through the vertex without intravenous contrast. RADIATION DOSE REDUCTION: This exam was performed according to the departmental dose-optimization program which includes automated exposure control, adjustment of the mA and/or kV according to patient size and/or use of iterative reconstruction technique. COMPARISON:  None Available. FINDINGS: Evaluation is limited by motion artifact, despite attempts to repeat the scan. Brain: No evidence of acute infarction, hemorrhage, mass, mass effect, or midline shift. No hydrocephalus or extra-axial  fluid collection. Vascular: No hyperdense vessel. Skull: Negative for fracture or focal lesion. Sinuses/Orbits: No acute finding. IMPRESSION: Motion limited. Within this limitation, no acute intracranial process. Electronically Signed   By: Wiliam Ke M.D.   On: 10/08/2023 11:29   DG CHEST PORT 1 VIEW Result Date: 10/08/2023 CLINICAL DATA:  Pleural effusion. EXAM: PORTABLE CHEST 1 VIEW COMPARISON:  10/06/2023 FINDINGS: Low volume film. The cardio pericardial silhouette is enlarged. There is pulmonary vascular congestion without overt pulmonary edema.  Collapse/consolidative opacity in the right mid and lower lung is similar to prior with right pleural effusion. Right pleural drain remains in place. Probable trace pneumothorax right apex. Telemetry leads overlie the chest. IMPRESSION: 1. Low volume film with pulmonary vascular congestion. 2. Right pleural drainage catheter again noted with probable trace apical right pneumothorax. 3. Persistent collapse/consolidative opacity in the right mid and lower lung with right pleural effusion. Electronically Signed   By: Kennith Center M.D.   On: 10/08/2023 07:16   US Abdomen Limited RUQ (LIVER/GB) Result Date: 10/06/2023 CLINICAL DATA:  Fatty liver EXAM: ULTRASOUND ABDOMEN LIMITED RIGHT UPPER QUADRANT COMPARISON:  09/28/2023 FINDINGS: Gallbladder: Gallbladder is partially distended with gallstone within. No wall thickening or pericholecystic fluid is noted. Negative sonographic Murphy's sign is elicited. Common bile duct: Diameter: 2.9 mm. Liver: Diffuse increased echogenicity is noted consistent with fatty infiltration. No focal mass is noted. Portal vein is patent on color Doppler imaging with normal direction of blood flow towards the liver. Other: None. IMPRESSION: Stable cholelithiasis. Fatty liver stable from the prior exam. Electronically Signed   By: Alcide Clever M.D.   On: 10/06/2023 23:38   DG CHEST PORT 1 VIEW Result Date: 10/06/2023 CLINICAL DATA:  Right  pleural effusion. EXAM: PORTABLE CHEST 1 VIEW COMPARISON:  October 05, 2023. FINDINGS: Stable cardiac enlargement. Stable loculated right pleural effusion with associated atelectasis or scarring. Pleural drainage catheter is noted. Left lung is unremarkable. Bony thorax is unremarkable. IMPRESSION: Stable loculated right pleural effusion with associated atelectasis or scarring. Right-sided pleural drainage catheter is again noted. Electronically Signed   By: Lupita Raider M.D.   On: 10/06/2023 12:34   DG Chest Port 1 View Result Date: 10/05/2023 CLINICAL DATA:  Follow-up pleural effusion EXAM: PORTABLE CHEST 1 VIEW COMPARISON:  10/05/2023 FINDINGS: Pleural catheter remains in place inferiorly on the right. Possible slight decrease in the amount of pleural fluid on the right although a moderate amount does persist. Dependent atelectasis. No pneumothorax. Mild left base atelectasis. IMPRESSION: Possible slight decrease in the amount of pleural fluid on the right although a moderate amount does persist. Dependent atelectasis. Electronically Signed   By: Paulina Fusi M.D.   On: 10/05/2023 14:33   DG Chest Port 1 View Result Date: 10/05/2023 CLINICAL DATA:  [id number] Chest tube in place [id number] EXAM: PORTABLE CHEST 1 VIEW COMPARISON:  Chest XR and CT chest, 10/02/2023.  IR CT, 10/04/2023. FINDINGS: Support lines: Interval removal of prior large bore tube and insertion of a basilar pigtail thoracostomy tube within the RIGHT chest. Cardiomegaly. Hypoinflation. The LEFT lung is relatively clear. Similar degree of inflation of the RIGHT lung with unchanged small volume RIGHT apical pneumothorax. RIGHT perihilar opacities and basilar consolidation with small volume pleural effusion. No interval osseous abnormality. IMPRESSION: 1. Interval removal of large bore tube and insertion of a basilar pigtail thoracostomy tube within the RIGHT chest. 2. Similar, small volume RIGHT apical pneumothorax. 3. RIGHT basilar consolidation  and small volume pleural effusion Electronically Signed   By: Roanna Banning M.D.   On: 10/05/2023 08:59   CT [facility] PLEURAL DRAIN W/INDWELL CATH W/IMG GUIDE Result Date: 10/04/2023 INDICATION: Prior mal-positioned chest tube tube. Request percutaneous thoracostomy [id number] Empyema (HCC) [id number] EXAM: CT-GUIDED RIGHT PIGTAIL THORACOSTOMY TUBE PLACEMENT COMPARISON:  CT chest, 10/02/2023.  Chest XR, 10/02/2023. MEDICATIONS: The patient is currently admitted to the hospital and receiving intravenous antibiotics. The antibiotics were administered within an appropriate time frame prior to the initiation of the procedure. ANESTHESIA/SEDATION: Moderate (conscious) sedation  was employed during this procedure. A total of Versed 2 mg and Fentanyl 100 mcg was administered intravenously. Moderate Sedation Time: 20 minutes. The patient's level of consciousness and vital signs were monitored continuously by radiology nursing throughout the procedure under my direct supervision. CONTRAST:  None COMPLICATIONS: None immediate. FLUOROSCOPY TIME:  CT dose; 954 mGycm PROCEDURE: RADIATION DOSE REDUCTION: This exam was performed according to the departmental dose-optimization program which includes automated exposure control, adjustment of the mA and/or kV according to patient size and/or use of iterative reconstruction technique. Informed written consent was obtained from the patient and/or patient's representative after a discussion of the risks, benefits and alternatives to treatment. The patient was placed supine on the CT gantry and a pre procedural CT was performed re-demonstrating the known abscess/fluid collection within the RIGHT chest. The procedure was planned. A timeout was performed prior to the initiation of the procedure. The RIGHT chest was prepped and draped in the usual sterile fashion. The overlying soft tissues were anesthetized with 1% lidocaine with epinephrine. Appropriate trajectory was planned with the use of a 22  gauge spinal needle. An 18 gauge trocar needle was advanced into the abscess/fluid collection and a short Amplatz super stiff wire was coiled within the collection. Appropriate positioning was confirmed with a limited CT scan. The tract was serially dilated allowing placement of a 14 Fr drainage catheter. Appropriate positioning was confirmed with a limited postprocedural CT scan. 15 ml of thick serosanguineous fluid was aspirated. The tube was connected to a pleura vac and sutured in place. A dressing was placed. The patient tolerated the procedure well without immediate post procedural complication. IMPRESSION: Successful CT guided placement of a 14 Fr pigtail thoracostomy drainage catheter into the LEFT chest with aspiration of 15 mL of thick serosanguineous fluid. Samples were sent to the laboratory as requested by the ordering clinical team. PLAN: Additional care including fibrinolysis per Pulmonology team. Roanna Banning, MD [facility] Radiology Electronically Signed   By: Roanna Banning M.D.   On: 10/04/2023 17:40   DG CHEST PORT 1 VIEW Result Date: 10/02/2023 CLINICAL DATA:  Chest tube in place.  Follow-up pneumothorax. EXAM: PORTABLE CHEST 1 VIEW COMPARISON:  10/01/2023 FINDINGS: Right lower chest tube is stable in position from previous exam. Right apical pneumothorax measures 1.6 cm over the apex. This is unchanged from previous exam. Asymmetric opacification within the right mid and lower lung is unchanged in the interval. Left lung is clear. IMPRESSION: 1. Stable right apical pneumothorax. 2. Stable right chest tube. 3. No change in aeration to the right mid and lower lung. Electronically Signed   By: Signa Kell M.D.   On: 10/02/2023 10:46   CT CHEST WO CONTRAST Result Date: 10/02/2023 CLINICAL DATA:  73 year old male with suspected hemopneumothorax. EXAM: CT CHEST WITHOUT CONTRAST TECHNIQUE: Multidetector CT imaging of the chest was performed  following the standard protocol without IV contrast. RADIATION DOSE REDUCTION: This exam was performed according to the departmental dose-optimization program which includes automated exposure control, adjustment of the mA and/or kV according to patient size and/or use of iterative reconstruction technique. COMPARISON:  Chest CT 09/29/2023. FINDINGS: Cardiovascular: Heart size is mildly enlarged. There is no significant pericardial fluid, thickening or pericardial calcification. There is aortic atherosclerosis, as well as atherosclerosis of the great vessels of the mediastinum and the coronary arteries, including calcified atherosclerotic plaque in the left main, left anterior descending, left circumflex and right coronary arteries. Calcifications of the mitral annulus. Dilatation of  the pulmonic trunk (4.0 cm in diameter). Mediastinum/Nodes: No pathologically enlarged mediastinal or hilar lymph nodes. Please note that accurate exclusion of hilar adenopathy is limited on noncontrast CT scans. Esophagus is unremarkable in appearance. No axillary lymphadenopathy. Lungs/Pleura: Right-sided chest tube noted extending into the right major fissure. Large volume of complex right-sided pleural fluid, with multiple internal locules of gas, many of which are not non dependent, suggesting loculated pleural effusion (potentially hemopneumothorax or empyema). Passive atelectasis in the right lung, most severe in the right lower lobe. No confluent consolidative airspace disease. No left pleural effusion. No definite suspicious appearing pulmonary nodule or mass identified. Upper Abdomen: Aortic atherosclerosis. Musculoskeletal: There are no aggressive appearing lytic or blastic lesions noted in the visualized portions of the skeleton. IMPRESSION: 1. Complex right-sided pleural gas and fluid collection which could represent a hemopneumothorax or empyema. Extensive passive atelectasis in the right lung. 2. Cardiomegaly. 3. Aortic  atherosclerosis, in addition to left main and three-vessel coronary artery disease. Assessment for potential risk factor modification, dietary therapy or pharmacologic therapy may be warranted, if clinically indicated. 4. Dilatation of the pulmonic trunk (4.0 cm in diameter), concerning for pulmonary arterial hypertension. Aortic Atherosclerosis (ICD10-I70.0). Electronically Signed   By: Trudie Reed M.D.   On: 10/02/2023 08:57   DG CHEST PORT 1 VIEW Result Date: 10/01/2023 CLINICAL DATA:  Pneumothorax EXAM: PORTABLE CHEST 1 VIEW COMPARISON:  X-ray 09/30/2023 and older FINDINGS: Stable right chest tube with a tiny right apical pneumothorax. Patchy right lung opacities are decreasing with significant residual. Elevation of the right hemidiaphragm. Stable subtle opacity left lung base. No left-sided pneumothorax. No edema. Stable cardiopericardial silhouette. Overlapping cardiac leads. Films are under penetrated. IMPRESSION: Stable right chest tube and small right apical pneumothorax. Decreasing patchy right lung opacity slightly with significant residual. Electronically Signed   By: Karen Kays M.D.   On: 10/01/2023 14:14   DG Chest 1 View Result Date: 09/30/2023 CLINICAL DATA:  Chest tube in place. EXAM: CHEST  1 VIEW COMPARISON:  Radiographs 09/29/2023 and 09/28/2023.  CT 09/29/2023. FINDINGS: 0501 hours. Right chest tube appears unchanged in position. Small residual right pleural effusion appears unchanged. There is a small right apical pneumothorax with residual right basilar airspace disease. Right chest wall soft tissue emphysema appears mildly improved. The left lung is clear. The heart size and mediastinal contours are stable. IMPRESSION: 1. Small right apical pneumothorax with right chest tube in place. 2. Unchanged small right pleural effusion and right basilar airspace disease. Electronically Signed   By: Carey Bullocks M.D.   On: 09/30/2023 09:03   DG Chest 1 View Result Date:  09/29/2023 CLINICAL DATA:  Right pleural effusion, right chest tube in place EXAM: CHEST  1 VIEW COMPARISON:  09/29/2023 FINDINGS: Two frontal views of the chest demonstrate right chest tube, tip and side port overlying the right lower hemithorax. Persistent but decreased partially loculated right pleural effusion. Continue consolidation at the right lung base. Left chest is clear. No pneumothorax. Stable enlargement of the cardiac silhouette. Subcutaneous gas within the right chest wall consistent with chest tube insertion. IMPRESSION: 1. Partially loculated right pleural effusion, slightly decreased after interval right chest tube placement. 2. Right basilar consolidation, favor atelectasis. 3. Stable enlargement of the cardiac silhouette. Electronically Signed   By: Sharlet Salina M.D.   On: 09/29/2023 16:44   CT CHEST WO CONTRAST Result Date: 09/29/2023 CLINICAL DATA:  Evaluate for malignancy. Pleural effusion. * Tracking Code: BO * EXAM: CT CHEST WITHOUT CONTRAST TECHNIQUE: Multidetector  CT imaging of the chest was performed following the standard protocol without IV contrast. RADIATION DOSE REDUCTION: This exam was performed according to the departmental dose-optimization program which includes automated exposure control, adjustment of the mA and/or kV according to patient size and/or use of iterative reconstruction technique. COMPARISON:  None Available. FINDINGS: Cardiovascular: Heart size appears within normal limits. No significant pericardial effusion. Aortic atherosclerosis and coronary artery calcifications. No pericardial effusion. Mediastinum/Nodes: Thyroid gland, trachea and esophagus are unremarkable. No enlarged supraclavicular, axillary or mediastinal lymph nodes. Hilar lymph nodes are suboptimally evaluated due to lack of IV contrast. Lungs/Pleura: Central airways appear patent. There is a moderate right pleural effusion with loculated fluid extending along the minor fissure and posterior major  fissure. Pleural fluid is mixed attenuation measuring between 25 Hounsfield units laterally an 2 Hounsfield units posteriorly. Scattered punctate calcified nodules compatible with prior granulomatous disease. No pulmonary mass or suspicious lung nodule identified. Upper Abdomen: Cyst overlying the anterior aspect of segment 4 B measures 1 cm. No acute abnormality noted within the imaged portions of the upper abdomen. Musculoskeletal: No acute or suspicious osseous findings. IMPRESSION: 1. Moderate right pleural effusion with loculated fluid extending along the minor fissure and posterior major fissure. Pleural fluid is mixed attenuation measuring between 25 Hounsfield units laterally and 2 Hounsfield units posteriorly. Findings are favored to represent a combination of simple and hemorrhagic pleural fluid. 2. No pulmonary mass or suspicious lung nodule identified. 3. Coronary artery calcifications. 4.  Aortic Atherosclerosis (ICD10-I70.0). Electronically Signed   By: Signa Kell M.D.   On: 09/29/2023 14:31   DG CHEST PORT 1 VIEW Result Date: 09/28/2023 CLINICAL DATA:  Status post thoracentesis EXAM: PORTABLE CHEST 1 VIEW COMPARISON:  09/27/2023 FINDINGS: Loculated right pleural effusion again noted, unchanged. Airspace disease in the right lower lung could reflect atelectasis or pneumonia. Left lung clear. Heart and mediastinal contours within normal limits. No acute bony abnormality. IMPRESSION: Continued moderate loculated right pleural effusion with right lower lobe atelectasis or infiltrate. Electronically Signed   By: Charlett Nose M.D.   On: 09/28/2023 14:01   US ABDOMEN LIMITED RUQ (LIVER/GB) Result Date: 09/28/2023 CLINICAL DATA:  Hepatic steatosis. EXAM: ULTRASOUND ABDOMEN LIMITED RIGHT UPPER QUADRANT COMPARISON:  June 17, 2022. FINDINGS: Gallbladder: 2.4 cm gallstone is noted without significant gallbladder wall thickening or pericholecystic fluid. No sonographic Murphy's sign. Common bile  duct: Diameter: 7 mm which is within normal limits. Liver: No focal lesion identified. Mildly increased echogenicity of hepatic parenchyma is noted suggesting hepatic steatosis. Portal vein is patent on color Doppler imaging with normal direction of blood flow towards the liver. Other: None. IMPRESSION: Probable hepatic steatosis.  Cholelithiasis without cholecystitis. Electronically Signed   By: Lupita Raider M.D.   On: 09/28/2023 10:38   DG Chest Port 1 View Result Date: 09/27/2023 CLINICAL DATA:  10026 Shortness of breath 10026 EXAM: PORTABLE CHEST 1 VIEW COMPARISON:  Chest x-ray 06/23/2016 FINDINGS: The heart and mediastinal contours are within normal limits. No focal consolidation. No pulmonary edema. Lobulated right moderate pleural effusion. Possible trace left pleural effusion. No pneumothorax. No acute osseous abnormality. IMPRESSION: 1. Lobulated right moderate pleural effusion. 2. Possible trace left pleural effusion. Electronically Signed   By: Tish Frederickson M.D.   On: 09/27/2023 19:19   US RENAL Result Date: 09/25/2023 CLINICAL DATA:  Acute renal injury EXAM: RENAL / URINARY TRACT ULTRASOUND COMPLETE COMPARISON:  06/17/2022 FINDINGS: Right Kidney: Renal measurements: 10.6 x 5.2 x 5.3 cm. = volume: 152 mL. Echogenicity within  normal limits. No mass or hydronephrosis visualized. Left Kidney: Renal measurements: 12.1 x 5.5 x 5.3 cm. = volume: 187 mL. A 9.8 cm cyst is noted arising from the lower pole of the left kidney. This is similar to that seen on prior ultrasound examination. A smaller mid polar 2.1 cm cyst is noted as well. No further follow-up is necessary. Bladder: Decompressed Other: None. IMPRESSION: Left renal cysts which are simple in appearance and stable from previous exams. No follow-up is recommended. No other focal abnormality is noted. Electronically Signed   By: Alcide Clever M.D.   On: 09/25/2023 18:21   ECHOCARDIOGRAM COMPLETE Result Date: 09/25/2023    ECHOCARDIOGRAM  REPORT   Patient Name:   Joshua [name] Date of Exam: 09/25/2023 Medical Rec #:  [id number]      Height:       68.0 in Accession #:    [id number]     Weight:       244.7 lb Date of Birth:  Apr 20, 1951      BSA:          2.227 m Patient Age:    72 years       BP:           122/39 mmHg Patient Gender: M              HR:           63 bpm. Exam Location:  Inpatient Procedure: 2D Echo, Cardiac Doppler and Color Doppler Indications:    Afib I48.91  History:        Patient has no prior history of Echocardiogram examinations.  Sonographer:    Harriette Bouillon RDCS Referring Phys: [phone] DAVID MANUEL ORTIZ IMPRESSIONS  1. Left ventricular ejection fraction, by estimation, is 55 to 60%. The left ventricle has normal function. The left ventricle has no regional wall motion abnormalities. Left ventricular diastolic parameters were normal.  2. Right ventricular systolic function is mildly reduced. The right ventricular size is normal.  3. Left atrial size was severely dilated.  4. The mitral valve is abnormal. No evidence of mitral valve regurgitation. No evidence of mitral stenosis.  5. The aortic valve is tricuspid. There is mild calcification of the aortic valve. There is mild thickening of the aortic valve. Aortic valve regurgitation is not visualized. Aortic valve sclerosis is present, with no evidence of aortic valve stenosis.  6. The inferior vena cava is dilated in size with <50% respiratory variability, suggesting right atrial pressure of 15 mmHg. FINDINGS  Left Ventricle: Left ventricular ejection fraction, by estimation, is 55 to 60%. The left ventricle has normal function. The left ventricle has no regional wall motion abnormalities. The left ventricular internal cavity size was normal in size. There is  no left ventricular hypertrophy. Left ventricular diastolic parameters were normal. Right Ventricle: The right ventricular size is normal. No increase in right ventricular wall thickness. Right ventricular systolic  function is mildly reduced. Left Atrium: Left atrial size was severely dilated. Right Atrium: Right atrial size was normal in size. Pericardium: Trivial pericardial effusion is present. The pericardial effusion is posterior to the left ventricle. Mitral Valve: The mitral valve is abnormal. There is mild thickening of the mitral valve leaflet(s). There is mild calcification of the mitral valve leaflet(s). Mild mitral annular calcification. No evidence of mitral valve regurgitation. No evidence of mitral valve stenosis. Tricuspid Valve: The tricuspid valve is normal in structure. Tricuspid valve regurgitation is mild . No evidence of tricuspid stenosis. Aortic  Valve: The aortic valve is tricuspid. There is mild calcification of the aortic valve. There is mild thickening of the aortic valve. Aortic valve regurgitation is not visualized. Aortic valve sclerosis is present, with no evidence of aortic valve stenosis. Pulmonic Valve: The pulmonic valve was normal in structure. Pulmonic valve regurgitation is trivial. No evidence of pulmonic stenosis. Aorta: The aortic root, ascending aorta and aortic arch are all structurally normal, with no evidence of dilitation or obstruction. Venous: The inferior vena cava is dilated in size with less than 50% respiratory variability, suggesting right atrial pressure of 15 mmHg. IAS/Shunts: No atrial level shunt detected by color flow Doppler.  LEFT VENTRICLE PLAX 2D LVIDd:         5.00 cm   Diastology LVIDs:         2.40 cm   LV e' medial:    5.55 cm/s LV PW:         1.10 cm   LV E/e' medial:  14.9 LV IVS:        1.10 cm   LV e' lateral:   7.94 cm/s LVOT diam:     2.00 cm   LV E/e' lateral: 10.4 LV SV:         95 LV SV Index:   43 LVOT Area:     3.14 cm  RIGHT VENTRICLE         IVC TAPSE (M-mode): 2.2 cm  IVC diam: 2.90 cm LEFT ATRIUM              Index        RIGHT ATRIUM           Index LA diam:        5.20 cm  2.34 cm/m   RA Area:     19.10 cm LA Vol (A2C):   108.0 ml 48.50 ml/m   RA Volume:   51.40 ml  23.08 ml/m LA Vol (A4C):   87.2 ml  39.16 ml/m LA Biplane Vol: 105.0 ml 47.15 ml/m  AORTIC VALVE LVOT Vmax:   143.00 cm/s LVOT Vmean:  106.000 cm/s LVOT VTI:    0.303 m  AORTA Ao Root diam: 3.10 cm Ao Asc diam:  3.70 cm MITRAL VALVE               TRICUSPID VALVE MV Area (PHT): 2.95 cm    TR Peak grad:   33.6 mmHg MV Decel Time: 257 msec    TR Vmax:        290.00 cm/s MV E velocity: 82.70 cm/s MV A velocity: 60.40 cm/s  SHUNTS MV E/A ratio:  1.37        Systemic VTI:  0.30 m                            Systemic Diam: 2.00 cm Charlton Haws MD Electronically signed by Charlton Haws MD Signature Date/Time: 09/25/2023/10:34:20 AM    Final     Microbiology: Results for orders placed or performed during the hospital encounter of 09/24/23  MRSA Next Gen by PCR, Nasal     Status: None   Collection Time: 09/24/23  4:42 PM   Specimen: Nasal Mucosa; Nasal Swab  Result Value Ref Range Status   MRSA by PCR Next Gen NOT DETECTED NOT DETECTED Final    Comment: (NOTE) The GeneXpert MRSA Assay (FDA approved for NASAL specimens only), is one component of a comprehensive MRSA colonization surveillance program. It is  not intended to diagnose MRSA infection nor to guide or monitor treatment for MRSA infections. Test performance is not FDA approved in patients less than 65 years old. Performed at [facility], [address] [place], [place] [address]   Body fluid culture w Gram Stain     Status: None   Collection Time: 09/28/23 11:12 AM   Specimen: Pleural Fluid  Result Value Ref Range Status   Specimen Description   Final    PLEURAL Performed at [facility], [address], [place], [place] [address]    Special Requests   Final    NONE Performed at [facility], [address] [place], [place] [address]    Gram Stain NO WBC SEEN NO ORGANISMS SEEN   Final   Culture   Final    NO GROWTH 3 DAYS Performed at [facility], [address], [place], [place] [address]    Report Status 10/01/2023 FINAL  Final  Body fluid culture w Gram Stain     Status: None   Collection Time: 10/04/23  5:26 PM   Specimen: Chest; Body Fluid  Result Value Ref Range Status   Specimen Description   Final    CHEST Performed at [facility], [address], [place], [place] [address]    Special Requests   Final    NONE Performed at [facility], [address] [place], [place] [address]    Gram Stain   Final    RARE WBC PRESENT, PREDOMINANTLY PMN NO ORGANISMS SEEN    Culture   Final    NO GROWTH 3 DAYS Performed at [facility], [address] [place], [place] [address]    Report Status 10/08/2023 FINAL  Final  Culture, blood (Routine X 2) w Reflex to ID Panel     Status: None   Collection Time: 10/18/23  8:12 AM   Specimen: BLOOD RIGHT HAND  Result Value Ref Range Status   Specimen Description   Final    BLOOD RIGHT HAND Performed at [facility], [address] [place], [place] [address]    Special Requests   Final    BOTTLES DRAWN AEROBIC ONLY Blood Culture results may not be optimal due to an inadequate volume of blood received in culture bottles Performed at [facility], [address], [place], [place] [address]    Culture   Final    NO GROWTH 5 DAYS Performed at [facility], [address] [place], [place] [address]    Report Status 10/23/2023 FINAL  Final  Culture, blood (Routine X 2) w Reflex to ID Panel     Status: None   Collection Time: 10/18/23  8:17 AM   Specimen: BLOOD LEFT HAND  Result Value Ref Range Status   Specimen Description   Final    BLOOD LEFT HAND Performed at [facility], [address], [place], [place] [address]    Special Requests   Final    BOTTLES DRAWN AEROBIC ONLY Blood Culture results may not be optimal due to an inadequate volume of blood received in culture bottles Performed at  [facility], [address] [place], [place] [address]    Culture   Final    NO GROWTH 5 DAYS Performed at [facility], [address], [place], [place] [address]    Report Status 10/23/2023 FINAL  Final  Labs: CBC: Recent Labs  Lab 10/19/23 0045 10/20/23 0439 10/21/23 0626 10/22/23 1120 10/24/23 0247  WBC 5.1 4.5 3.6* 4.7 4.0  HGB 8.4* 7.9* 7.8* 8.5* 8.1*  HCT 26.9* 25.4* 25.6* 27.8* 26.2*  MCV 95.4 96.9 98.5 96.2 97.0  PLT 159 150 155 176 173   Basic Metabolic Panel: Recent Labs  Lab 10/18/23 0258 10/19/23 0045 10/20/23 0439 10/21/23 0626 10/24/23 0247  NA 142 137 138 140 143  K 3.0* 2.9* 3.3* 3.6 3.1*  CL 109 108 109 109 112*  CO2 21* 19* 23 21* 21*  GLUCOSE 93 119* 103* 102* 115*  BUN 14 19 23  25* 23  CREATININE 1.18 1.31* 1.29* 1.10 1.33*  CALCIUM 8.3* 7.5* 7.6* 7.9* 7.9*  MG 1.5* 1.7 1.8  --   --   PHOS 2.0* 3.4 2.9  --   --    Liver Function Tests: Recent Labs  Lab 10/20/23 0439 10/24/23 0247  AST 19 24  ALT 8 11  ALKPHOS 47 51  BILITOT 0.8 0.6  PROT 5.4* 5.5*  ALBUMIN 2.4* 2.4*   CBG: No results for input(s): "GLUCAP" in the last 168 hours.  Discharge time spent: approximately 45 minutes spent on discharge counseling, evaluation of patient on day of discharge, and coordination of discharge planning with nursing, social work, pharmacy and case management  Signed: Alberteen Sam, MD [facility] 10/24/2023

## 2023-10-25 ENCOUNTER — Inpatient Hospital Stay (HOSPITAL_COMMUNITY): Payer: Medicare HMO

## 2023-10-25 DIAGNOSIS — R5381 Other malaise: Secondary | ICD-10-CM | POA: Diagnosis not present

## 2023-10-25 LAB — COMPREHENSIVE METABOLIC PANEL
ALT: 9 U/L (ref 0–44)
AST: 25 U/L (ref 15–41)
Albumin: 2.2 g/dL — ABNORMAL LOW (ref 3.5–5.0)
Alkaline Phosphatase: 53 U/L (ref 38–126)
Anion gap: 9 (ref 5–15)
BUN: 20 mg/dL (ref 8–23)
CO2: 20 mmol/L — ABNORMAL LOW (ref 22–32)
Calcium: 7.8 mg/dL — ABNORMAL LOW (ref 8.9–10.3)
Chloride: 107 mmol/L (ref 98–111)
Creatinine, Ser: 1.54 mg/dL — ABNORMAL HIGH (ref 0.61–1.24)
GFR, Estimated: 47 mL/min — ABNORMAL LOW (ref >60)
Glucose, Bld: 110 mg/dL — ABNORMAL HIGH (ref 70–99)
Potassium: 3.8 mmol/L (ref 3.5–5.1)
Sodium: 136 mmol/L (ref 135–145)
Total Bilirubin: 1.2 mg/dL (ref 0.0–1.2)
Total Protein: 5.4 g/dL — ABNORMAL LOW (ref 6.5–8.1)

## 2023-10-25 LAB — CBC WITH DIFFERENTIAL/PLATELET
Abs Immature Granulocytes: 0.07 10*3/uL (ref 0.00–0.07)
Basophils Absolute: 0 10*3/uL (ref 0.0–0.1)
Basophils Relative: 1 %
Eosinophils Absolute: 0.2 10*3/uL (ref 0.0–0.5)
Eosinophils Relative: 5 %
HCT: 27.7 % — ABNORMAL LOW (ref 39.0–52.0)
Hemoglobin: 8.6 g/dL — ABNORMAL LOW (ref 13.0–17.0)
Immature Granulocytes: 2 %
Lymphocytes Relative: 24 %
Lymphs Abs: 0.9 10*3/uL (ref 0.7–4.0)
MCH: 30 pg (ref 26.0–34.0)
MCHC: 31 g/dL (ref 30.0–36.0)
MCV: 96.5 fL (ref 80.0–100.0)
Monocytes Absolute: 0.4 10*3/uL (ref 0.1–1.0)
Monocytes Relative: 11 %
Neutro Abs: 2.2 10*3/uL (ref 1.7–7.7)
Neutrophils Relative %: 57 %
Platelets: 167 10*3/uL (ref 150–400)
RBC: 2.87 MIL/uL — ABNORMAL LOW (ref 4.22–5.81)
RDW: 17.4 % — ABNORMAL HIGH (ref 11.5–15.5)
WBC: 3.8 10*3/uL — ABNORMAL LOW (ref 4.0–10.5)
nRBC: 0 % (ref 0.0–0.2)

## 2023-10-25 MED ORDER — SODIUM CHLORIDE 0.9 % IV SOLN: INTRAVENOUS | Status: AC

## 2023-10-25 MED ORDER — ONDANSETRON 4 MG PO TBDP: 4.0000 mg | ORAL_TABLET | Freq: Three times a day (TID) | ORAL | Status: DC | PRN

## 2023-10-25 MED ORDER — PROCHLORPERAZINE MALEATE 5 MG PO TABS
5.0000 mg | ORAL_TABLET | Freq: Four times a day (QID) | ORAL | Status: DC | PRN
  Filled 2023-10-25: qty 2

## 2023-10-25 MED ORDER — CALCIUM CARBONATE 1250 (500 CA) MG PO TABS
500.0000 mg | ORAL_TABLET | Freq: Two times a day (BID) | ORAL | Status: DC
  Administered 2023-10-25 – 2023-11-10 (×28): 1250 mg via ORAL
  Filled 2023-10-25 (×30): qty 1

## 2023-10-25 MED ORDER — TAMSULOSIN HCL 0.4 MG PO CAPS
0.4000 mg | ORAL_CAPSULE | Freq: Every day | ORAL | Status: DC
Start: 2023-10-26 — End: 2023-11-05
  Administered 2023-10-28 – 2023-11-04 (×8): 0.4 mg via ORAL
  Filled 2023-10-25 (×8): qty 1

## 2023-10-25 MED ORDER — ACETAMINOPHEN 325 MG PO TABS
650.0000 mg | ORAL_TABLET | Freq: Three times a day (TID) | ORAL | Status: DC
  Administered 2023-10-25 – 2023-11-10 (×59): 650 mg via ORAL
  Filled 2023-10-25 (×62): qty 2

## 2023-10-25 NOTE — Progress Notes (Signed)
Inpatient Rehabilitation  Patient information reviewed and entered into eRehab system by [facility]. Karen Kays., CCC/SLP, PPS Coordinator.  Information including medical coding, functional ability and quality indicators will be reviewed and updated through discharge.

## 2023-10-25 NOTE — Progress Notes (Signed)
Met with patient to review current situation, team conference and plan of care. Patient complained of pain on his chest but not in any form of distress. RN made aware. Dr. Shearon Stalls in room as well. Patient continues to mix his food but need cues to eat. RN and NT made aware that he will benefit from intermittent supervision. Continue to follow along to discharge to provide educational need to facilitate for discharge.

## 2023-10-25 NOTE — Plan of Care (Signed)
Wound Plan   Wounds present: 09/24/23 Irritant Dermatitis (Moisture Associated Skin Damage) Abdomen Bilateral;Left;Right MASD in skin folds   Interventions:  Petroleum jel and foam to skin tears Interdry to abd folds Boost Breeze TID  Folic Acid daily Heart healthy diet; eating 50-75% of meals Standard skin care orders Timed toileting protocol Pressure redistribution pad for wheelchair  Braden Score: 14 Sensory: 1 Moisture: 2 Activity: 2 Mobility: 3 Nutrition: 3 Friction: 3  Contributors: Shelle Iron RD, LDN Konrad Dolores, RN-BC, WTA

## 2023-10-25 NOTE — Progress Notes (Signed)
Patient ID: Joshua [name], male   DOB: [birth year], 73 y.o.   MRN: [id number]  [id number] spoke with [name] to introduce self, explain role, and discuss discharge process. She confirms that she will be primary caregiver. States their son and dtr will help PRN in the evenings after work. She is hopeful that he will build back of his strength/endurance where he can get around well on his own. SW will provide updates after team conference.   Cecile Sheerer, MSW, LCSW Office: [phone] Cell: [phone] Fax: [phone]

## 2023-10-25 NOTE — Plan of Care (Signed)
  Problem: RH Balance Goal: LTG: Patient will maintain dynamic sitting balance (OT) Description: LTG:  Patient will maintain dynamic sitting balance with assistance during activities of daily living (OT) Flowsheets (Taken 10/25/2023 1251) LTG: Pt will maintain dynamic sitting balance during ADLs with: Supervision/Verbal cueing Goal: LTG Patient will maintain dynamic standing with ADLs (OT) Description: LTG:  Patient will maintain dynamic standing balance with assist during activities of daily living (OT)  Flowsheets (Taken 10/25/2023 1251) LTG: Pt will maintain dynamic standing balance during ADLs with: Contact Guard/Touching assist   Problem: Sit to Stand Goal: LTG:  Patient will perform sit to stand in prep for activites of daily living with assistance level (OT) Description: LTG:  Patient will perform sit to stand in prep for activites of daily living with assistance level (OT) Flowsheets (Taken 10/25/2023 1251) LTG: PT will perform sit to stand in prep for activites of daily living with assistance level: Contact Guard/Touching assist   Problem: RH Eating Goal: LTG Patient will perform eating w/assist, cues/equip (OT) Description: LTG: Patient will perform eating with assist, with/without cues using equipment (OT) Flowsheets (Taken 10/25/2023 1251) LTG: Pt will perform eating with assistance level of: Supervision/Verbal cueing   Problem: RH Grooming Goal: LTG Patient will perform grooming w/assist,cues/equip (OT) Description: LTG: Patient will perform grooming with assist, with/without cues using equipment (OT) Flowsheets (Taken 10/25/2023 1251) LTG: Pt will perform grooming with assistance level of: Supervision/Verbal cueing   Problem: RH Bathing Goal: LTG Patient will bathe all body parts with assist levels (OT) Description: LTG: Patient will bathe all body parts with assist levels (OT) Flowsheets (Taken 10/25/2023 1251) LTG: Pt will perform bathing with assistance level/cueing:  Contact Guard/Touching assist   Problem: RH Dressing Goal: LTG Patient will perform upper body dressing (OT) Description: LTG Patient will perform upper body dressing with assist, with/without cues (OT). Flowsheets (Taken 10/25/2023 1251) LTG: Pt will perform upper body dressing with assistance level of: Supervision/Verbal cueing Goal: LTG Patient will perform lower body dressing w/assist (OT) Description: LTG: Patient will perform lower body dressing with assist, with/without cues in positioning using equipment (OT) Flowsheets (Taken 10/25/2023 1251) LTG: Pt will perform lower body dressing with assistance level of: Contact Guard/Touching assist   Problem: RH Toileting Goal: LTG Patient will perform toileting task (3/3 steps) with assistance level (OT) Description: LTG: Patient will perform toileting task (3/3 steps) with assistance level (OT)  Flowsheets (Taken 10/25/2023 1251) LTG: Pt will perform toileting task (3/3 steps) with assistance level: Contact Guard/Touching assist   Problem: RH Toilet Transfers Goal: LTG Patient will perform toilet transfers w/assist (OT) Description: LTG: Patient will perform toilet transfers with assist, with/without cues using equipment (OT) Flowsheets (Taken 10/25/2023 1251) LTG: Pt will perform toilet transfers with assistance level of: Contact Guard/Touching assist   Problem: RH Tub/Shower Transfers Goal: LTG Patient will perform tub/shower transfers w/assist (OT) Description: LTG: Patient will perform tub/shower transfers with assist, with/without cues using equipment (OT) Flowsheets (Taken 10/25/2023 1251) LTG: Pt will perform tub/shower stall transfers with assistance level of: Contact Guard/Touching assist

## 2023-10-25 NOTE — Progress Notes (Signed)
PMR Admission Coordinator Pre-Admission Assessment   Patient: Joshua [name] is an 73 y.o., male MRN: [id number] DOB: [birth year] Height: 5\' 8"  (172.7 cm) Weight: 102.6 kg   Insurance Information HMO:     PPO: Yes     PCP:       IPA:       80/20:       OTHER: Group [id number] [employer] PRIMARY: Orpah [place] PPO premier plus      Policy#: [id number]      Subscriber: patient CM Name:       Phone#: [phone]     Fax#: [phone] Pre-Cert#: [id number] approved from 10/21/23 to 11/02/23      Employer: Not working Benefits:  Phone #: [phone]     Name: [email or web address] Eff. Date: 09/29/23     Deduct:  $0      Out of Pocket Max: $4150 (met $0)      Life Max: n/a CIR: $300/day for days 1-6 max $1800/admission      SNF: $10/day for days 1-20; $214/day for days 21-100 Outpatient: med ned     Co-Pay: $10/visit Home Health: 100%      Co-Pay: none DME: 80%     Co-Pay: 20% Providers: in network  SECONDARY:       Policy#:      Phone#:    Artist:       Phone#:    The Data processing manager" for patients in Inpatient Rehabilitation Facilities with attached "Privacy Act Statement-Health Care Records" was provided and verbally reviewed with: Patient   Emergency Contact Information Contact Information       Name Relation Home Work Mobile    Sayre Spouse [phone]   [phone]    Mylan, Schwarz Daughter [phone]             Other Contacts   None on File        Current Medical History  Patient Admitting Diagnosis: Debility/GIB   History of Present Illness: A 73 y.o. M with pAF on Pradaxa, CAD s/p PCI >5 yrs, DM, OSA, HTN, obesity, HLD, and CKD IIIa who presented to [facility] on 09/24/23 with dizziness, melena.  In the ER, found to have GIB, AKI.  Praxbind given.  GI consulted, and EGD showed AVMs.  Then developed worsening hypoxia, found to have a loculated pleural effusion, chest tube was placed and eventually removed successfully.  Hospital course  also complicated by persistent encephalopathy/delirium, now resolved.  PT/OT evaluations were completed with recommendations for acute inpatient rehab admission.     Patient's medical record from [facility] has been reviewed by the rehabilitation admission coordinator and physician.   Past Medical History      Past Medical History:  Diagnosis Date   Arthritis     Atrial fibrillation (HCC)     Atrial flutter (HCC)     Back pain     CAD (coronary artery disease)      stents placed 2009   Complication of anesthesia      woke up during surgery and ablation   DM (diabetes mellitus) (HCC)      type 2   Elevated PSA     Gout     Hearing problem     HTN (hypertension)     Hyperlipidemia     Myocardial infarct, old 2010   Prostatitis     Renal insufficiency            Has the patient had major surgery during  100 days prior to admission? Yes   Family History   family history includes Alzheimer's disease in his mother; Cancer in his brother; Gout in his mother; Heart disease in his brother; Heart failure in his father; Hypertension in his mother.   Current Medications  Current Medications    Current Facility-Administered Medications:    acetaminophen (TYLENOL) tablet 650 mg, 650 mg, Oral, Q6H PRN, 650 mg at 10/20/23 2219 **OR** acetaminophen (TYLENOL) suppository 650 mg, 650 mg, Rectal, Q6H PRN, Gonfa, Taye T, MD   alum & mag hydroxide-simeth (MAALOX/MYLANTA) 200-200-20 MG/5ML suspension 30 mL, 30 mL, Oral, Q4H PRN, Alanda Slim, Taye T, MD, 30 mL at 10/10/23 2237   carvedilol (COREG) tablet 12.5 mg, 12.5 mg, Oral, BID WC, Gherghe, Costin M, MD, 12.5 mg at 10/22/23 1006   dabigatran (PRADAXA) capsule 150 mg, 150 mg, Oral, Q12H, Gherghe, Costin M, MD, 150 mg at 10/22/23 1006   feeding supplement (BOOST / RESOURCE BREEZE) liquid 1 Container, 1 Container, Oral, BID BM, Danford, Earl Lites, MD, 1 Container at 10/20/23 1702   folic acid (FOLVITE) tablet 1 mg, 1 mg, Oral,  Daily, Danford, Earl Lites, MD, 1 mg at 10/22/23 1006   hydrALAZINE (APRESOLINE) injection 10 mg, 10 mg, Intravenous, Q4H PRN, Leatha Gilding, MD, 10 mg at 10/14/23 1421   hydrocortisone cream 1 %, , Topical, PRN, Rai, Ripudeep K, MD, Given at 10/01/23 2242   hydrOXYzine (ATARAX) tablet 10 mg, 10 mg, Oral, TID PRN, Leatha Gilding, MD, 10 mg at 10/20/23 2219   isosorbide mononitrate (IMDUR) 24 hr tablet 30 mg, 30 mg, Oral, Daily, Elvera Lennox, Costin M, MD, 30 mg at 10/22/23 1006   lidocaine (XYLOCAINE) 2 % viscous mouth solution 15 mL, 15 mL, Mouth/Throat, Q4H PRN, Mims, Lauren W, DO   metoprolol tartrate (LOPRESSOR) injection 2.5 mg, 2.5 mg, Intravenous, Q4H PRN, Gonfa, Taye T, MD   ondansetron (ZOFRAN) tablet 4 mg, 4 mg, Oral, Q6H PRN **OR** ondansetron (ZOFRAN) injection 4 mg, 4 mg, Intravenous, Q6H PRN, Alanda Slim, Taye T, MD, 4 mg at 10/10/23 0159   Oral care mouth rinse, 15 mL, Mouth Rinse, PRN, Gonfa, Taye T, MD   [EXPIRED] pantoprazole (PROTONIX) injection 40 mg, 40 mg, Intravenous, Q5 min **FOLLOWED BY** pantoprazole (PROTONIX) injection 40 mg, 40 mg, Intravenous, Q12H, Gonfa, Taye T, MD, 40 mg at 10/22/23 1006   rosuvastatin (CRESTOR) tablet 20 mg, 20 mg, Oral, Daily, Elvera Lennox, Costin M, MD, 20 mg at 10/22/23 1006   sodium chloride (OCEAN) 0.65 % nasal spray 2 spray, 2 spray, Each Nare, PRN, Candelaria Stagers T, MD, 2 spray at 10/18/23 2126   sodium chloride flush (NS) 0.9 % injection 10 mL, 10 mL, Intravenous, Q12H, Rai, Ripudeep K, MD, 10 mL at 10/22/23 1006   sodium chloride flush (NS) 0.9 % injection 10-40 mL, 10-40 mL, Intracatheter, PRN, Rai, Ripudeep K, MD, 10 mL at 10/04/23 2134   tamsulosin (FLOMAX) capsule 0.4 mg, 0.4 mg, Oral, Daily, Rai, Ripudeep K, MD, 0.4 mg at 10/22/23 1006     Patients Current Diet:  Diet Order                  Diet Heart Room service appropriate? Yes; Fluid consistency: Thin  Diet effective now                         Precautions /  Restrictions Precautions Precautions: Fall Precaution Comments: continous spitting Restrictions Weight Bearing Restrictions Per Provider Order: No  Has the patient had 2 or more falls or a fall with injury in the past year? No   Prior Activity Level Community (5-7x/wk): Went out daily.   Prior Functional Level Self Care: Did the patient need help bathing, dressing, using the toilet or eating? Independent   Indoor Mobility: Did the patient need assistance with walking from room to room (with or without device)? Independent   Stairs: Did the patient need assistance with internal or external stairs (with or without device)? Independent   Functional Cognition: Did the patient need help planning regular tasks such as shopping or remembering to take medications? Independent   Patient Information Are you of Hispanic, Latino/a,or Spanish origin?: A. No, not of Hispanic, Latino/a, or Spanish origin What is your race?: A. White Do you need or want an interpreter to communicate with a doctor or health care staff?: 0. No   Patient's Response To:  Health Literacy and Transportation Is the patient able to respond to health literacy and transportation needs?: Yes Health Literacy - How often do you need to have someone help you when you read instructions, pamphlets, or other written material from your doctor or pharmacy?: Never In the past 12 months, has lack of transportation kept you from medical appointments or from getting medications?: No In the past 12 months, has lack of transportation kept you from meetings, work, or from getting things needed for daily living?: No   Home Assistive Devices / Equipment Home Equipment: Gilmer Mor - single point   Prior Device Use: Indicate devices/aids used by the patient prior to current illness, exacerbation or injury? None of the above   Current Functional Level Cognition   Overall Cognitive Status: Impaired/Different from baseline Current Attention  Level: Focused Orientation Level: Oriented X4 Following Commands: Follows one step commands with increased time Safety/Judgement: Decreased awareness of deficits General Comments: delayed processing, requires constant cues to perform and for technique    Extremity Assessment (includes Sensation/Coordination)   Upper Extremity Assessment: Generalized weakness  Lower Extremity Assessment: Defer to PT evaluation     ADLs   Overall ADL's : Needs assistance/impaired Eating/Feeding: Set up, Sitting Grooming: Total assistance, Bed level Grooming Details (indicate cue type and reason): attempted to participate in combing hair sitting EOB with HOH needed for reaching up to top of head. patient not engaged in task. Upper Body Bathing: Total assistance, Sitting Lower Body Bathing: Total assistance, +2 for physical assistance, Bed level Upper Body Dressing : Total assistance, Bed level Lower Body Dressing: Total assistance, +2 for physical assistance, Bed level Toilet Transfer: +2 for safety/equipment, +2 for physical assistance, Minimal assistance, Ambulation, Rolling walker (2 wheels) Toilet Transfer Details (indicate cue type and reason): to bathroom in room with increased time. wife noted to get in the way while trying to assess vitals and manage lines/leads. patient attempting to vomit upon sitting on commode. nurse made aware. HR up to 139 bpm during session Toileting- Clothing Manipulation and Hygiene: Sit to/from stand, Total assistance General ADL Comments: patient was more lethargic during session with daughter and wife hyper involved in session.     Mobility   Overal bed mobility: Needs Assistance Bed Mobility: Rolling, Supine to Sit Rolling: +2 for physical assistance, Used rails, Mod assist Sidelying to sit: +2 for safety/equipment, +2 for physical assistance, Mod assist Supine to sit: Total assist, +2 for physical assistance, +2 for safety/equipment Sit to supine: Mod assist, +2 for  physical assistance, +2 for safety/equipment General bed mobility comments: requiring multimodal cues and  time to initiate; pt with loose BM in bed so assisted with pericare and rolling prior to OOB     Transfers   Overall transfer level: Needs assistance Equipment used: Rolling walker (2 wheels) Transfers: Sit to/from Stand Sit to Stand: Min assist, Mod assist, +2 physical assistance Bed to/from chair/wheelchair/BSC transfer type:: Step pivot Stand pivot transfers: Max assist, Total assist, +2 physical assistance, +2 safety/equipment Step pivot transfers: Mod assist, +2 physical assistance General transfer comment: assist to rise and steady, cues for use of RW and small steps over to recliner, assist to control descent     Ambulation / Gait / Stairs / Wheelchair Mobility   Ambulation/Gait Ambulation/Gait assistance: Mod assist, +2 physical assistance, +2 safety/equipment Gait Distance (Feet): 20 Feet Assistive device: Rolling walker (2 wheels) Gait Pattern/deviations: Step-to pattern, Staggering left, Staggering right General Gait Details: patient  required  multimodal cues to advance with RW, turn and  hold rail to sit down to toilet, cues and maxx support to stand from low toiet, then amb 6 ' to reclienr as patient appeared weak and pale, having dry heaves. Gait velocity: decr     Posture / Balance Balance Overall balance assessment: Needs assistance Sitting-balance support: Feet supported, No upper extremity supported, Bilateral upper extremity supported Sitting balance-Leahy Scale: Fair Standing balance support: During functional activity, Reliant on assistive device for balance, Bilateral upper extremity supported Standing balance-Leahy Scale: Poor Standing balance comment: steady assist  to stand at RW     Special needs/care consideration Skin Sin tears on right arm, left thigh, coccyx and moisture damage on abdomen  and Special service needs None    Previous Home Environment  (from acute therapy documentation) Living Arrangements: Spouse/significant other Available Help at Discharge: Family, Available 24 hours/day Type of Home: House Home Layout: One level Home Access: Stairs to enter Entrance Stairs-Rails: None Entrance Stairs-Number of Steps: 4 Bathroom Shower/Tub: Health visitor: Handicapped height Home Care Services: No   Discharge Living Setting Plans for Discharge Living Setting: Patient's home, House, Lives with (comment) (Lives with wife.) Type of Home at Discharge: House Discharge Home Layout: One level Discharge Home Access: Stairs to enter Entrance Stairs-Rails: None Entrance Stairs-Number of Steps: 2 steps from garage into [employer] entry Discharge Bathroom Shower/Tub: Walk-in shower, Curtain Discharge Bathroom Toilet: Handicapped height Discharge Bathroom Accessibility: Yes How Accessible: Accessible via walker Does the patient have any problems obtaining your medications?: No   Social/Family/Support Systems Patient Roles: Spouse, Parent (Has wife locally) Contact Information: Nicklos Gaxiola - spouse - [phone] Anticipated Caregiver: Wife Ability/Limitations of Caregiver: Wife is retired and can assist. Medical laboratory scientific officer: 24/7 Discharge Plan Discussed with Primary Caregiver: Yes Is Caregiver In Agreement with Plan?: Yes Does Caregiver/Family have Issues with Lodging/Transportation while Pt is in Rehab?: No   Goals Patient/Family Goal for Rehab: PT/OT supervision to min assist goals Expected length of stay: 12-14 days Pt/Family Agrees to Admission and willing to participate: Yes Program Orientation Provided & Reviewed with Pt/Caregiver Including Roles  & Responsibilities: Yes   Decrease burden of Care through IP rehab admission: N/A   Possible need for SNF placement upon discharge: Not planned   Patient Condition: I have reviewed medical records from [facility], spoken with CSW, and patient  and spouse. I discussed via phone for inpatient rehabilitation assessment.  Patient will benefit from ongoing PT/OT, can actively participate in 3 hours of therapy a day 5 days of the week, and can make measurable gains during the admission.  Patient  will also benefit from the coordinated team approach during an Inpatient Acute Rehabilitation admission.  The patient will receive intensive therapy as well as Rehabilitation physician, nursing, social worker, and care management interventions.  Due to bladder management, bowel management, safety, skin/wound care, disease management, medication administration, pain management, and patient education the patient requires 24 hour a day rehabilitation nursing.  The patient is currently Min to Mod assist with mobility and basic ADLs.  Discharge setting and therapy post discharge at home with home health is anticipated.  Patient has agreed to participate in the Acute Inpatient Rehabilitation Program and will admit on Sunday 10/24/23.   Preadmission Screen Completed By:  Trish Mage, 10/22/2023 1:53 PM ______________________________________________________________________   Discussed status with Dr. Riley Kill on 10/22/23 at 0915 and received approval for admission Sunday 10/24/23.   Admission Coordinator:  Trish Mage, RN, time 153/Date 10/22/23    Assessment/Plan: Diagnosis: Debility due to GIB Does the need for close, 24 hr/day Medical supervision in concert with the patient's rehab needs make it unreasonable for this patient to be served in a less intensive setting? Yes Co-Morbidities requiring supervision/potential complications: DM, pAfib, CAD, OSA, HTN, obesity; BPH, hemothorax- loculated Due to bladder management, bowel management, safety, skin/wound care, disease management, medication administration, pain management, and patient education, does the patient require 24 hr/day rehab nursing? Yes Does the patient require coordinated care of a physician, rehab  nurse, PT, OT,  to address physical and functional deficits in the context of the above medical diagnosis(es)? Yes Addressing deficits in the following areas: balance, endurance, locomotion, strength, transferring, bowel/bladder control, bathing, dressing, feeding, grooming, and toileting Can the patient actively participate in an intensive therapy program of at least 3 hrs of therapy 5 days a week? Yes The potential for patient to make measurable gains while on inpatient rehab is good Anticipated functional outcomes upon discharge from inpatient rehab: supervision and min assist PT, supervision and min assist OT, n/a SLP Estimated rehab length of stay to reach the above functional goals is: 12-14 days Anticipated discharge destination: Home 10. Overall Rehab/Functional Prognosis: good     MD Signature:

## 2023-10-25 NOTE — Evaluation (Addendum)
Occupational Therapy Assessment and Plan  Patient Details  Name: Joshua [name] MRN: [id number] Date of Birth: 02-18-1951  OT Diagnosis: abnormal posture, acute pain, ataxia, cognitive deficits, and muscle weakness (generalized) Rehab Potential: Rehab Potential (ACUTE ONLY): Fair ELOS: 2-3 weeks   Today's Date: 10/25/2023 OT Individual Time: 1000-1114 OT Individual Time Calculation (min): 74 min     Hospital Problem: Principal Problem:   Debility Active Problems:   Gastrointestinal hemorrhage with melena   Past Medical History:  Past Medical History:  Diagnosis Date   Arthritis    Atrial fibrillation (HCC)    Atrial flutter (HCC)    Back pain    CAD (coronary artery disease)    stents placed 2009   Cholelithiasis    Complication of anesthesia    woke up during surgery and ablation   DM (diabetes mellitus) (HCC)    type 2   Elevated PSA    Gout    Hearing problem    HTN (hypertension)    Hyperlipidemia    Myocardial infarct, old 2010   Prostatitis    Renal insufficiency    Past Surgical History:  Past Surgical History:  Procedure Laterality Date   CARDIAC CATHETERIZATION  11/07/07   with 2 stents   COLONOSCOPY     Electrophysiologic study and RF catheter ablation   of AV node reentrant tachycardia.     ESOPHAGOGASTRODUODENOSCOPY (EGD) WITH PROPOFOL N/A 09/30/2023   Procedure: ESOPHAGOGASTRODUODENOSCOPY (EGD) WITH PROPOFOL;  Surgeon: Lynann Bologna, MD;  Location: [facility] ENDOSCOPY;  Service: Gastroenterology;  Laterality: N/A;   EXTERNAL EAR SURGERY Right    cartilage   HOT HEMOSTASIS N/A 09/30/2023   Procedure: HOT HEMOSTASIS (ARGON PLASMA COAGULATION/BICAP);  Surgeon: Lynann Bologna, MD;  Location: [facility];  Service: Gastroenterology;  Laterality: N/A;   LEFT AND RIGHT HEART CATHETERIZATION WITH CORONARY/GRAFT ANGIOGRAM N/A 09/27/2014   Procedure: LEFT AND RIGHT HEART CATHETERIZATION WITH Isabel Caprice;  Surgeon: Lesleigh Noe, MD;  Location: [facility] CATH  LAB;  Service: Cardiovascular;  Laterality: N/A;   LUMBAR LAMINECTOMY/DECOMPRESSION MICRODISCECTOMY Left 04/29/2018   Procedure: Left Lumbar Four-Five Laminectomy/Foraminotomy;  Surgeon: Tia Alert, MD;  Location: [facility];  Service: Neurosurgery;  Laterality: Left;  Left Lumbar Four-Five Laminectomy/Foraminotomy   TOOTH EXTRACTION     VASECTOMY      Assessment & Plan Clinical Impression: Joshua [name] is a 73 year old R handed male with history of A fib/A flutter on Pradaxa, CAD, T2DM, HTN, gout, chronic LBP, diverticulosis, CKD (plans for renal bx), elevated PSA who was admitted to [facility] on 09/24/23 with reports of progressive weakness, SOB and dizziness. Family reported melena X 2 weeks and work up revealed Hyponatremia with Na-128, acute renal failure w/BUN/SCr-126/4.45, Hgb noted to be 7.0 and platelets 148. He was received a liter of bicarb and one unit PRBC.  DOAC held and vitamin K administered due to reports of penile bleeding 12/28.  GI consulted with plans for EGD but he developed hypoxia with increased oxygen needs and CXR done revealing moderate right loculated pleural effusion.    He underwent thoracocentesis of 150 cc of dark blood fluid by Dr Chi at bedside on 12/31 and CT chest done revealing large right loculated pleural effusion concerning for clotted hemothorax. Right chest tube placed with removal of clotted 200-300 cc old blood and frequent saline flushes recommended.  He was treated with Vitamin K for INR- 9.5, praxibind X 2 doses as well as FFP and received additional 2 units PRBC.  EGD  performed on 01/02 revealing tow gastric AVMs -one with active intermittent oozing which was treated with APC. Dr. Chales Abrahams recommended PPI bid X 4 weeks and resumption of AC on 01/04. Post procedure did have some blood as well as dark stools.  DOE improved and chest tube pulled and flushed with  minimal improvement.   Repeat Chest CT 01/04 showed complex right pleural gas and fluid collection  -hemopneumothorax or empyema with extensive passive atelectasis of lung.  IR consulted and CT guided 14 Fr pigtail thoracostomy catheter placed by Dr. Milford Cage and patient started on IV Zosyn for purulent  as well as HT nebs with pulmonary hygiene. Pradaxa held due to bloody drainage from tube. He had issues with epistasis as well as worsening of delirium with agitation requiring restraints for safety. He required fibrinolysis x 3 of clotted chest tube.  Palliative care consulted to discuss GOC and family decided on DNR. He was weaned down to RA and IV  heparin stated on 01/15 and chest tube removed on 01/14. As  H/H stable, pradaxa resumed on 01/21 ,  He had worsening of delirium with spitting, refusal to take meds and tapping of fingers. CT head showed mild age related atrophy and no acute changes. Dr. Melynda Ripple consulted due to ongoing issues with delirium and EEG negative for seizures. He did sustain a fall without injuries reported on 01/21. Mentation continues to fluctuate and he is  noted to be deconditioned.  He is requiring +2 min to mod assist for SPT and family has decline SNF. Family requested CIR  due to functional decline. He was independent PTA and furniture walked.  Patient currently requires max with basic self-care skills and IADL secondary to muscle weakness and muscle joint tightness, decreased cardiorespiratoy endurance, impaired timing and sequencing, ataxia, decreased coordination, and decreased motor planning, decreased initiation, decreased attention, decreased awareness, decreased problem solving, decreased safety awareness, decreased memory, and delayed processing, and decreased sitting balance, decreased standing balance, decreased postural control, and decreased balance strategies.  Prior to hospitalization, patient could complete BADL's and use of SPC for amb with modified independent .  Patient will benefit from skilled intervention to decrease level of assist with basic self-care  skills, increase independence with basic self-care skills, and increase level of independence with iADL prior to discharge SNF vs home with care partner.  Anticipate patient will require 24 hour supervision and minimal physical assistance and follow up home health.  OT - End of Session Activity Tolerance: Decreased this session Endurance Deficit: Yes Endurance Deficit Description: rest breaks with all mobility tasks OT Assessment Rehab Potential (ACUTE ONLY): Fair OT Barriers to Discharge: Incontinence;Other (comments) OT Barriers to Discharge Comments: Cog/safety, skin integrity, high falls risk OT Patient demonstrates impairments in the following area(s): Balance;Motor;Sensory;Behavior;Nutrition;Skin Integrity;Cognition;Pain;Endurance;Safety OT Basic ADL's Functional Problem(s): Eating;Grooming;Bathing;Dressing;Toileting OT Advanced ADL's Functional Problem(s): Simple Meal Preparation;Laundry;Light Housekeeping OT Transfers Functional Problem(s): Toilet;Tub/Shower OT Additional Impairment(s): None OT Plan OT Intensity: Minimum of 1-2 x/day, 45 to 90 minutes OT Frequency: 5 out of 7 days OT Duration/Estimated Length of Stay: 2 weeks OT Treatment/Interventions: Balance/vestibular training;Community reintegration;Disease mangement/prevention;Functional electrical stimulation;Neuromuscular re-education;Patient/family education;Self Care/advanced ADL retraining;Splinting/orthotics;Therapeutic Exercise;UE/LE Coordination activities;Wheelchair propulsion/positioning;Cognitive remediation/compensation;Discharge planning;DME/adaptive equipment instruction;Functional mobility training;Pain management;Psychosocial support;Skin care/wound managment;Therapeutic Activities;UE/LE Strength taining/ROM;Visual/perceptual remediation/compensation OT Self Feeding Anticipated Outcome(s): mod I OT Basic Self-Care Anticipated Outcome(s): Supervision OT Toileting Anticipated Outcome(s): Supervision OT Bathroom  Transfers Anticipated Outcome(s): Supervision/CGA OT Recommendation Recommendations for Other Services: Speech consult;Neuropsych consult;Therapeutic Recreation consult Therapeutic Recreation Interventions: Pet therapy Patient destination: Home Follow  Up Recommendations: Home health OT Equipment Recommended: To be determined   OT Evaluation Precautions/Restrictions  Precautions Precautions: Fall Precaution Comments: continous spitting Restrictions Weight Bearing Restrictions Per Provider Order: No General Chart Reviewed: Yes Family/Caregiver Present: No  Pain Pain Assessment Pain Scale: 0-10 Faces Pain Scale: No hurt Pain Type: Acute pain Pain Location: Abdomen Pain Descriptors / Indicators: Sore Pain Onset: Gradual Patients Stated Pain Goal: 0 Pain Intervention(s): Repositioned Multiple Pain Sites: No Home Living/Prior Functioning Home Living Family/patient expects to be discharged to:: Private residence Living Arrangements: Spouse/significant other Available Help at Discharge: Family, Available 24 hours/day Type of Home: House Home Access: Stairs to enter Secretary/administrator of Steps: 4 Entrance Stairs-Rails: None Home Layout: One level Bathroom Shower/Tub: Health visitor: Handicapped height Additional Comments: information from med chart as pt unreliable historian  Lives With: Spouse IADL History Occupation: Retired Type of Occupation: retired from Scientist, physiological Leisure and Hobbies: "hunting" Prior Function Level of Independence: Independent with homemaking with ambulation, Independent with gait, Independent with basic ADLs  Able to Take Stairs?: Yes Vision Baseline Vision/History: 1 Wears glasses Ability to See in Adequate Light: 0 Adequate Patient Visual Report: No change from baseline Perception  Perception: Impaired Praxis Praxis: Impaired Praxis Impairment Details: Motor  planning;Ideation;Perseveration Cognition Cognition Overall Cognitive Status: Impaired/Different from baseline Arousal/Alertness: Lethargic Orientation Level: Person Memory: Impaired Memory Impairment: Decreased recall of new information;Decreased short term memory Decreased Short Term Memory: Functional basic Attention: Focused;Sustained Focused Attention: Impaired Sustained Attention: Impaired Awareness: Impaired Problem Solving: Impaired Safety/Judgment: Impaired Brief Interview for Mental Status (BIMS) Repetition of Three Words (First Attempt): 3 Temporal Orientation: Year: Missed by 1 year Temporal Orientation: Month: Missed by 6 days to 1 month Temporal Orientation: Day: Incorrect Recall: "Sock": Yes, no cue required Recall: "Blue": Yes, after cueing ("a color") Recall: "Bed": No, could not recall BIMS Summary Score: 9 Sensation Sensation Light Touch: Impaired by gross assessment Hot/Cold: Appears Intact Proprioception: Appears Intact Stereognosis: Appears Intact Additional Comments: reports numbness in feet and stomach, unable to respond accurately to gross assessment of sensation secondary to cognitive deficits but able to discern light touch in LE's and intact B UE's Coordination Gross Motor Movements are Fluid and Coordinated: No Fine Motor Movements are Fluid and Coordinated: No Coordination and Movement Description: decreased smoothness and accuracy secondary to generalized weakness, requires multimodal cues to remain on task Finger Nose Finger Test: slowed Bly 9 Hole Peg Test: 25 sec R 27 sec L Motor  Motor Motor: Motor apraxia;Other (comment) Motor - Skilled Clinical Observations: impaired midline with posterior lean, apraxia requiring max multimodal cues for mobility, demonstrates pilling with R index finger  Trunk/Postural Assessment  Cervical Assessment Cervical Assessment: Exceptions to [facility] (forward head) Thoracic Assessment Thoracic Assessment:  Exceptions to [facility] (rounded shoulders) Lumbar Assessment Lumbar Assessment: Exceptions to [facility] (posterior pelvic tilt) Postural Control Postural Control: Deficits on evaluation Righting Reactions: delayed and inadequate Protective Responses: decreased Postural Limitations: decreased  Balance Balance Balance Assessed: Yes Static Sitting Balance Static Sitting - Balance Support: Bilateral upper extremity supported;Feet supported Static Sitting - Level of Assistance: 4: Min assist Dynamic Sitting Balance Dynamic Sitting - Balance Support: Bilateral upper extremity supported;Feet supported Dynamic Sitting - Level of Assistance: 4: Min Oncologist Standing - Balance Support: Bilateral upper extremity supported;During functional activity Static Standing - Level of Assistance: 2: Max assist Dynamic Standing Balance Dynamic Standing - Balance Support: During functional activity;Bilateral upper extremity supported Dynamic Standing - Level of Assistance: 1: +2 Total assist  Extremity/Trunk Assessment RUE Assessment RUE Assessment: Within Functional Limits General Strength Comments: 50 lbs LUE Assessment LUE Assessment: Within Functional Limits General Strength Comments: 50 lbs  Care Tool Care Tool Self Care Eating   Eating Assist Level: Minimal Assistance - Patient > 75%    Oral Care    Oral Care Assist Level: Minimal Assistance - Patient > 75%    Bathing   Body parts bathed by patient: Right arm;Left arm;Chest;Abdomen;Face Body parts bathed by helper: Front perineal area;Buttocks;Right upper leg;Left upper leg;Right lower leg;Left lower leg   Assist Level: Maximal Assistance - Patient 24 - 49%    Upper Body Dressing(including orthotics)   What is the patient wearing?: Pull over shirt   Assist Level: Minimal Assistance - Patient > 75%    Lower Body Dressing (excluding footwear)   What is the patient wearing?: Incontinence brief;Pants Assist for lower  body dressing: Maximal Assistance - Patient 25 - 49%    Putting on/Taking off footwear     Assist for footwear: Total Assistance - Patient < 25%       Care Tool Toileting Toileting activity   Assist for toileting: Total Assistance - Patient < 25%     Care Tool Bed Mobility Roll left and right activity   Roll left and right assist level: Maximal Assistance - Patient 25 - 49%    Sit to lying activity   Sit to lying assist level: 2 Helpers    Lying to sitting on side of bed activity   Lying to sitting on side of bed assist level: the ability to move from lying on the back to sitting on the side of the bed with no back support.: 2 Helpers     Care Tool Transfers Sit to stand transfer   Sit to stand assist level: 2 Helpers    Chair/bed transfer   Chair/bed transfer assist level: 2 Software engineer transfer   Assist Level: 2 Helpers     Care Tool Cognition  Expression of Ideas and Wants Expression of Ideas and Wants: 2. Frequent difficulty - frequently exhibits difficulty with expressing needs and ideas  Understanding Verbal and Non-Verbal Content Understanding Verbal and Non-Verbal Content: 3. Usually understands - understands most conversations, but misses some part/intent of message. Requires cues at times to understand   Memory/Recall Ability Memory/Recall Ability : Current season;That he or she is in a hospital/hospital unit   Refer to Care Plan for Long Term Goals  SHORT TERM GOAL WEEK 1 OT Short Term Goal 1 (Week 1): Pt will stand sink side level for 2 min for grooming with min cues OT Short Term Goal 2 (Week 1): Pt will complete LB self care with min A with cues and AE as needed OT Short Term Goal 3 (Week 1): Pt will transfer to and from TTB and toilet with grab bars with min A and min cues  Recommendations for other services: NP, Pet therapy and Speech Therapy   Skilled Therapeutic Intervention ADL ADL Eating: Minimal assistance Where Assessed-Eating:  Wheelchair Grooming: Minimal assistance Where Assessed-Grooming: Wheelchair Upper Body Bathing: Minimal assistance Where Assessed-Upper Body Bathing: Wheelchair Lower Body Bathing: Maximal assistance Where Assessed-Lower Body Bathing: Wheelchair Upper Body Dressing: Minimal assistance Where Assessed-Upper Body Dressing: Wheelchair Lower Body Dressing: Maximal assistance Where Assessed-Lower Body Dressing: Wheelchair Toileting: Maximal assistance Where Assessed-Toileting: Bedside Commode Toilet Transfer: Maximal assistance Toilet Transfer Method: Surveyor, minerals: IT sales professional: Insurance underwriter: Maximal assistance  Walk-In Engineer, structural Method: Warden/ranger: Emergency planning/management officer ADL Comments: max A LB, min A UB, eating and grooming and all with cues due to cog/processing and sequencing, SPT + 2 with RW with nursing safety plan STEDY + 2 Mobility     OT Intervention/Treatment:  Pt seen for full initial OT evaluation and training session this am. Pt already up in TIS upon OT arrival. OT introduced role of therapy and purpose of session. Pt  open to all presented assessment and training this visit. Pt with significant delayed initiation with itching and fidgeting throughout session. MD aware of scalp skin breakdown and OT issued foam resistance sponge for easing tendency. OT assisted and assessed ADL's, mobility, vision, sensation, cognition/lang, G/FMC, strength and balance throughout session. BIMs, grip and pinch also tested. See above for levels.  Pt will benefit from skilled OT services at CIR to maximize function and safety with recommendation to return home vs SNF with at least S/CGA for BADL's  and HHOT services upon d/c home. Pt left at end of session in TIS reclined with LE's elevated and chair alarm set, tray table and nurse call bell within reach.   Discharge Criteria: Patient will be  discharged from OT if patient refuses treatment 3 consecutive times without medical reason, if treatment goals not met, if there is a change in medical status, if patient makes no progress towards goals or if patient is discharged from hospital.  The above assessment, treatment plan, treatment alternatives and goals were discussed and mutually agreed upon: by patient  Vicenta Dunning 10/25/2023, 12:48 PM

## 2023-10-25 NOTE — Evaluation (Signed)
Physical Therapy Assessment and Plan  Patient Details  Name: Joshua [name] MRN: [id number] Date of Birth: June 01, 1951  PT Diagnosis: Abnormality of gait, Difficulty walking, Impaired cognition, Impaired sensation, and Muscle weakness Rehab Potential: Good ELOS: 2-3 weeks   Today's Date: 10/25/2023 PT Individual Time: 0805-0905 + 1324-4010 PT Individual Time Calculation (min): 60 min  + 71 min  Hospital Problem: Principal Problem:   Debility Active Problems:   Gastrointestinal hemorrhage with melena   Past Medical History:  Past Medical History:  Diagnosis Date   Arthritis    Atrial fibrillation (HCC)    Atrial flutter (HCC)    Back pain    CAD (coronary artery disease)    stents placed 2009   Cholelithiasis    Complication of anesthesia    woke up during surgery and ablation   DM (diabetes mellitus) (HCC)    type 2   Elevated PSA    Gout    Hearing problem    HTN (hypertension)    Hyperlipidemia    Myocardial infarct, old 2010   Prostatitis    Renal insufficiency    Past Surgical History:  Past Surgical History:  Procedure Laterality Date   CARDIAC CATHETERIZATION  11/07/07   with 2 stents   COLONOSCOPY     Electrophysiologic study and RF catheter ablation   of AV node reentrant tachycardia.     ESOPHAGOGASTRODUODENOSCOPY (EGD) WITH PROPOFOL N/A 09/30/2023   Procedure: ESOPHAGOGASTRODUODENOSCOPY (EGD) WITH PROPOFOL;  Surgeon: Lynann Bologna, MD;  Location: [facility];  Service: Gastroenterology;  Laterality: N/A;   EXTERNAL EAR SURGERY Right    cartilage   HOT HEMOSTASIS N/A 09/30/2023   Procedure: HOT HEMOSTASIS (ARGON PLASMA COAGULATION/BICAP);  Surgeon: Lynann Bologna, MD;  Location: [facility];  Service: Gastroenterology;  Laterality: N/A;   LEFT AND RIGHT HEART CATHETERIZATION WITH CORONARY/GRAFT ANGIOGRAM N/A 09/27/2014   Procedure: LEFT AND RIGHT HEART CATHETERIZATION WITH Isabel Caprice;  Surgeon: Lesleigh Noe, MD;  Location: [facility] CATH LAB;   Service: Cardiovascular;  Laterality: N/A;   LUMBAR LAMINECTOMY/DECOMPRESSION MICRODISCECTOMY Left 04/29/2018   Procedure: Left Lumbar Four-Five Laminectomy/Foraminotomy;  Surgeon: Tia Alert, MD;  Location: [facility] OR;  Service: Neurosurgery;  Laterality: Left;  Left Lumbar Four-Five Laminectomy/Foraminotomy   TOOTH EXTRACTION     VASECTOMY      Assessment & Plan Clinical Impression: Patient is a 73 year old R handed male with history of A fib/A flutter on Pradaxa, CAD, T2DM, HTN, gout, chronic LBP, diverticulosis, CKD (plans for renal bx), elevated PSA who was admitted to [facility] on 09/24/23 with reports of progressive weakness, SOB and dizziness. Family reported melena X 2 weeks and work up revealed Hyponatremia with Na-128, acute renal failure w/BUN/SCr-126/4.45, Hgb noted to be 7.0 and platelets 148. He was received a liter of bicarb and one unit PRBC.  DOAC held and vitamin K administered due to reports of penile bleeding 12/28.  GI consulted with plans for EGD but he developed hypoxia with increased oxygen needs and CXR done revealing moderate right loculated pleural effusion.    He underwent thoracocentesis of 150 cc of dark blood fluid by Dr Chi at bedside on 12/31 and CT chest done revealing large right loculated pleural effusion concerning for clotted hemothorax. Right chest tube placed with removal of clotted 200-300 cc old blood and frequent saline flushes recommended.  He was treated with Vitamin K for INR- 9.5, praxibind X 2 doses as well as FFP and received additional 2 units PRBC.  EGD performed on  01/02 revealing tow gastric AVMs -one with active intermittent oozing which was treated with APC. Dr. Chales Abrahams recommended PPI bid X 4 weeks and resumption of AC on 01/04. Post procedure did have some blood as well as dark stools.  DOE improved and chest tube pulled and flushed with  minimal improvement.   Repeat Chest CT 01/04 showed complex right pleural gas and fluid collection -hemopneumothorax or  empyema with extensive passive atelectasis of lung.  IR consulted and CT guided 14 Fr pigtail thoracostomy catheter placed by Dr. Milford Cage and patient started on IV Zosyn for purulent  as well as HT nebs with pulmonary hygiene. Pradaxa held due to bloody drainage from tube. He had issues with epistasis as well as worsening of delirium with agitation requiring restraints for safety. He required fibrinolysis x 3 of clotted chest tube.  Palliative care consulted to discuss GOC and family decided on DNR. He was weaned down to RA and IV  heparin stated on 01/15 and chest tube removed on 01/14. As  H/H stable, pradaxa resumed on 01/21 ,  He had worsening of delirium with spitting, refusal to take meds and tapping of fingers. CT head showed mild age related atrophy and no acute changes. Dr. Melynda Ripple consulted due to ongoing issues with delirium and EEG negative for seizures. He did sustain a fall without injuries reported on 01/21. Mentation continues to fluctuate and he is  noted to be deconditioned.  He is requiring +2 min to mod assist for SPT and family has decline SNF. Family requested CIR  due to functional decline. He was independent PTA and furniture walked.  Patient currently requires total with mobility secondary to muscle weakness, decreased cardiorespiratoy endurance, impaired timing and sequencing, unbalanced muscle activation, motor apraxia, decreased coordination, and decreased motor planning, decreased midline orientation, decreased initiation, decreased attention, decreased awareness, decreased problem solving, decreased safety awareness, decreased memory, and delayed processing, and decreased sitting balance, decreased standing balance, decreased postural control, and decreased balance strategies.  Prior to hospitalization, patient was independent  with mobility and lived with Spouse in a House home.  Home access is 4Stairs to enter.  Patient will benefit from skilled PT intervention to maximize safe  functional mobility, minimize fall risk, and decrease caregiver burden for planned discharge home with 24 hour supervision.  Anticipate patient will benefit from follow up HH at discharge.  PT - End of Session Activity Tolerance: Tolerates 30+ min activity with multiple rests Endurance Deficit: Yes Endurance Deficit Description: rest breaks with all mobility tasks PT Assessment Rehab Potential (ACUTE/IP ONLY): Good PT Barriers to Discharge: Inaccessible home environment;Home environment access/layout;Decreased caregiver support;Incontinence PT Barriers to Discharge Comments: 4 STE, incontinent of bladder PT Patient demonstrates impairments in the following area(s): Safety;Balance;Behavior;Endurance;Motor;Nutrition;Perception;Sensory;Pain PT Transfers Functional Problem(s): Bed Mobility;Bed to Chair;Car PT Locomotion Functional Problem(s): Ambulation;Stairs;Wheelchair Mobility PT Plan PT Intensity: Minimum of 1-2 x/day ,45 to 90 minutes PT Frequency: 5 out of 7 days PT Duration Estimated Length of Stay: 2-3 weeks PT Treatment/Interventions: Ambulation/gait training;Community reintegration;DME/adaptive equipment instruction;Neuromuscular re-education;Psychosocial support;Stair training;UE/LE Strength taining/ROM;Wheelchair propulsion/positioning;Balance/vestibular training;Discharge planning;Pain management;Therapeutic Activities;UE/LE Coordination activities;Cognitive remediation/compensation;Disease management/prevention;Functional mobility training;Patient/family education;Therapeutic Exercise;Visual/perceptual remediation/compensation;Splinting/orthotics PT Transfers Anticipated Outcome(s): supervision PT Locomotion Anticipated Outcome(s): supervision ambulatory PT Recommendation Recommendations for Other Services: Speech consult Follow Up Recommendations: Home health PT Patient destination: Home Equipment Recommended: To be determined   PT  Evaluation Precautions/Restrictions Precautions Precautions: Fall Precaution Comments: continous spitting Restrictions Weight Bearing Restrictions Per Provider Order: No General   Vitals Sitting: BP 150/66, HR 49bpm Pain  Interference Pain Interference Pain Effect on Sleep: 4. Almost constantly Pain Interference with Therapy Activities: 4. Almost constantly Pain Interference with Day-to-Day Activities: 4. Almost constantly Home Living/Prior Functioning Home Living Available Help at Discharge: Family;Available 24 hours/day Type of Home: House Home Access: Stairs to enter [employer] of Steps: 4 Entrance Stairs-Rails: None Home Layout: One level Bathroom Shower/Tub: Health visitor: Handicapped height Additional Comments: information from med chart as pt unreliable historian  Lives With: Spouse Prior Function Level of Independence: Independent with homemaking with ambulation;Independent with gait;Independent with basic ADLs  Able to Take Stairs?: Yes Cognition Overall Cognitive Status: Impaired/Different from baseline Orientation Level: Disoriented to place;Disoriented to situation;Disoriented to time;Oriented to person Year: 2024 Month: January Day of Week: Incorrect Sensation Sensation Light Touch: Impaired by gross assessment Hot/Cold: Not tested Proprioception: Not tested Stereognosis: Not tested Additional Comments: reports numbness in feet and stomach, unable to respond accurately to gross assessment of sensation secondary to cognitive deficits Coordination Gross Motor Movements are Fluid and Coordinated: No Fine Motor Movements are Fluid and Coordinated: No Coordination and Movement Description: decreased smoothness and accuracy secondary to generalized weakness, requires multimodal cues to remain on task Motor  Motor Motor: Motor apraxia;Other (comment) Motor - Skilled Clinical Observations: impaired midline with posterior lean, apraxia  requiring max multimodal cues for mobility, demonstrates pilling with R index finger  Trunk/Postural Assessment  Cervical Assessment Cervical Assessment: Exceptions to [facility] (forward head) Thoracic Assessment Thoracic Assessment: Exceptions to [facility] (rounded shoulders) Lumbar Assessment Lumbar Assessment: Exceptions to [facility] (posterior pelvic tilt) Postural Control Postural Control: Deficits on evaluation Righting Reactions: delayed and inadequate Protective Responses: decreased Postural Limitations: decreased  Balance Balance Balance Assessed: Yes Static Sitting Balance Static Sitting - Balance Support: Bilateral upper extremity supported;Feet supported Static Sitting - Level of Assistance: 4: Min assist Dynamic Sitting Balance Dynamic Sitting - Balance Support: Bilateral upper extremity supported;Feet supported Dynamic Sitting - Level of Assistance: 4: Min Oncologist Standing - Balance Support: Bilateral upper extremity supported;During functional activity Static Standing - Level of Assistance: 2: Max assist Dynamic Standing Balance Dynamic Standing - Balance Support: During functional activity;Bilateral upper extremity supported Dynamic Standing - Level of Assistance: 1: +2 Total assist Extremity Assessment  RLE Assessment RLE Assessment: Exceptions to [facility] General Strength Comments: grossly 3+/5 assessed functionally, minimally able to complete formal assessment due to cognition LLE Assessment LLE Assessment: Exceptions to [facility] General Strength Comments: grossly 3+/5 assessed functionally, minimally able to complete formal assessment due to cognition  Care Tool Care Tool Bed Mobility Roll left and right activity   Roll left and right assist level: Maximal Assistance - Patient 25 - 49%    Sit to lying activity   Sit to lying assist level: 2 Helpers    Lying to sitting on side of bed activity   Lying to sitting on side of bed assist level: the ability  to move from lying on the back to sitting on the side of the bed with no back support.: 2 Helpers     Care Tool Transfers Sit to stand transfer   Sit to stand assist level: 2 Helpers    Chair/bed transfer   Chair/bed transfer assist level: 2 Web designer transfer activity did not occur: Safety/medical concerns        Care Tool Locomotion Ambulation Ambulation activity did not occur: Safety/medical concerns        Walk 10 feet activity Walk 10 feet activity did not occur:  Safety/medical concerns       Walk 50 feet with 2 turns activity Walk 50 feet with 2 turns activity did not occur: Safety/medical concerns      Walk 150 feet activity Walk 150 feet activity did not occur: Safety/medical concerns      Walk 10 feet on uneven surfaces activity Walk 10 feet on uneven surfaces activity did not occur: Safety/medical concerns      Stairs Stair activity did not occur: Safety/medical concerns        Walk up/down 1 step activity Walk up/down 1 step or curb (drop down) activity did not occur: Safety/medical concerns      Walk up/down 4 steps activity Walk up/down 4 steps activity did not occur: Safety/medical concerns      Walk up/down 12 steps activity Walk up/down 12 steps activity did not occur: Safety/medical concerns      Pick up small objects from floor Pick up small object from the floor (from standing position) activity did not occur: Safety/medical concerns      Wheelchair Is the patient using a wheelchair?: Yes Type of Wheelchair: Manual   Wheelchair assist level: Dependent - Patient 0%    Wheel 50 feet with 2 turns activity   Assist Level: Dependent - Patient 0%  Wheel 150 feet activity   Assist Level: Dependent - Patient 0%    Refer to Care Plan for Long Term Goals  SHORT TERM GOAL WEEK 1 PT Short Term Goal 1 (Week 1): Pt will complete bed mobility with mod assist and min verbal cues PT Short Term Goal 2 (Week 1): Pt will complete  transfers with mod assist and LRAD PT Short Term Goal 3 (Week 1): Pt will ambulate 77' with mod assist  Recommendations for other services: Other: SLP  Skilled Therapeutic Intervention SESSION 1: Evaluation completed (see details above and below) with education on PT POC and goals and individual treatment initiated with focus on transfer training and tolerance to upright. Therapist provides TIS WC to optimize positioning and allow for pressure relief while seated due to poor initiation. Pt reports pain in stomach and perseverative on "I need to use the bathroom" however to noted to be incontinent and states he does not have to use bathroom once seated on the toilet, continues to remain perseverative throughout session. Pt completes bed mobility with max assist x2, completes transfer to [facility], bed, and WC with RW with mod assist x2 with pt demonstrating severe retropulsion, significant difficulty following verbal commands requiring max cues for transfers and mobility. Pt remains seated in TIS WC with all needs within reach, call light in place, and chair alarm donned and activated at end of session.  SESSION 2: Pt presents in room in [facility], agreeable to PT, pt reporting pain in stomach and again stating he needs to use restroom. Session focused on transfer training, following verbal commands, and gait training.  Pt completes stand step transfer with RW with mod assist x2 to [facility]. Pt remains seated with increased time, urinal placed due to body habitus. Pt noted to be incontinent. Therapist provides max cues for attention to task with pt unable to void continently. Pt demonstrating some hallucinations during session stating that his wife was in the room and could make decisions for him. Pt reoriented to situation and place. Pt completes stand step transfer with RW mod assist x2 to WC.  Pt transported to day room dependently. Pt completes gait training ambulates 2x20' with RW mod assist and 2nd person close  WC  follow due to pt with sudden sitting, pt requires MAX verbal cues for attention to task with pt demonstrating difficulty ambulating forward demonstrating side stepping and turning RW despite cues.  Pt then prompted to complete NMR for attending to task, sequencing, and following commands including: - attempt to sequence pegs on peg board, pt very minimally able to attend to task with max cues unable to follow commands - stacking cones with one step commands, requiring max cues for attention to task  Pt noted to spit frequently and spits on table during task.  Pt transported back to room, completes stand step transfer with mod assist x2, increased time and max assist for sit to supine with max cues. Pt remains supine in bed with all needs within reach, call light in place, and bed alarm activated at end of session.   Mobility Bed Mobility Bed Mobility: Supine to Sit Supine to Sit: 2 Helpers Transfers Transfers: Editor, commissioning Transfers: Total Assistance - Patient < 25% Stand Pivot Transfer Details: Tactile cues for weight shifting;Manual facilitation for placement;Manual facilitation for weight shifting;Verbal cues for safe use of DME/AE;Verbal cues for gait pattern;Verbal cues for precautions/safety;Verbal cues for technique;Visual cues/gestures for sequencing;Verbal cues for sequencing Transfer (Assistive device): Rolling walker Locomotion  Gait Ambulation: No Gait Gait: No Stairs / Additional Locomotion Stairs: No Wheelchair Mobility Wheelchair Mobility: No   Discharge Criteria: Patient will be discharged from PT if patient refuses treatment 3 consecutive times without medical reason, if treatment goals not met, if there is a change in medical status, if patient makes no progress towards goals or if patient is discharged from hospital.  The above assessment, treatment plan, treatment alternatives and goals were discussed and mutually agreed upon: by  patient  Edwin Cap PT, DPT 10/25/2023, 12:34 PM

## 2023-10-25 NOTE — Discharge Instructions (Addendum)
Inpatient Rehab Discharge Instructions  Joshua [name] Discharge date and time:    Activities/Precautions/ Functional Status: Activity: no lifting, driving, or strenuous exercise for till cleared by MD Diet: cardiac diet Wound Care:    Functional status:  ___ No restrictions     ___ Walk up steps independently ___ 24/7 supervision/assistance   ___ Walk up steps with assistance ___ Intermittent supervision/assistance  ___ Bathe/dress independently ___ Walk with walker     ___ Bathe/dress with assistance ___ Walk Independently    ___ Shower independently ___ Walk with assistance    ___ Shower with assistance ___ No alcohol     ___ Return to work/school ________    Special Instructions:    My questions have been answered and I understand these instructions. I will adhere to these goals and the provided educational materials after my discharge from the hospital.  Patient/Caregiver Signature _______________________________ Date __________  Clinician Signature _______________________________________ Date __________  Please bring this form and your medication list with you to all your follow-up doctor's appointments.   Information on my medicine - Pradaxa (dabigatran)  This medication education was reviewed with me or my healthcare representative as part of my discharge preparation.    Why was Pradaxa prescribed for you? Pradaxa was prescribed for you to reduce the risk of forming blood clots that cause a stroke if you have a medical condition called atrial fibrillation (a type of irregular heartbeat).    What do you Need to know about PradAXa? Take your Pradaxa TWICE DAILY - one capsule in the morning and one tablet in the evening with or without food.  It would be best to take the doses about the same time each day.  The capsules should not be broken, chewed or opened - they must be swallowed whole.  Do not store Pradaxa in other medication containers - once the bottle  is opened the Pradaxa should be used within FOUR months; throw away any capsules that haven't been by that time.  Take Pradaxa exactly as prescribed by your doctor.  DO NOT stop taking Pradaxa without talking to the doctor who prescribed the medication.  Stopping without other stroke prevention medication to take the place of Pradaxa may increase your risk of developing a clot that causes a stroke.  Refill your prescription before you run out.  After discharge, you should have regular check-up appointments with your healthcare provider that is prescribing your Pradaxa.  In the future your dose may need to be changed if your kidney function or weight changes by a significant amount.  What do you do if you miss a dose? If you miss a dose, take it as soon as you remember on the same day.  If your next dose is less than 6 hours away, skip the missed dose.  Do not take two doses of PRADAXA at the same time.  Important Safety Information A possible side effect of Pradaxa is bleeding. You should call your healthcare provider right away if you experience any of the following: Bleeding from an injury or your nose that does not stop. Unusual colored urine (red or dark brown) or unusual colored stools (red or black). Unusual bruising for unknown reasons. A serious fall or if you hit your head (even if there is no bleeding).  Some medicines may interact with Pradaxa and might increase your risk of bleeding or clotting while on Pradaxa. To help avoid this, consult your healthcare provider or pharmacist prior to using any new prescription  or non-prescription medications, including herbals, vitamins, non-steroidal anti-inflammatory drugs (NSAIDs) and supplements.  This website has more information on Pradaxa (dabigatran): [email or web address]://[email or web address].com

## 2023-10-25 NOTE — Plan of Care (Signed)
  Problem: RH Balance Goal: LTG Patient will maintain dynamic standing balance (PT) Description: LTG:  Patient will maintain dynamic standing balance with assistance during mobility activities (PT) Flowsheets (Taken 10/25/2023 1238) LTG: Pt will maintain dynamic standing balance during mobility activities with:: Supervision/Verbal cueing   Problem: Sit to Stand Goal: LTG:  Patient will perform sit to stand with assistance level (PT) Description: LTG:  Patient will perform sit to stand with assistance level (PT) Flowsheets (Taken 10/25/2023 1238) LTG: PT will perform sit to stand in preparation for functional mobility with assistance level: Supervision/Verbal cueing   Problem: RH Bed Mobility Goal: LTG Patient will perform bed mobility with assist (PT) Description: LTG: Patient will perform bed mobility with assistance, with/without cues (PT). Flowsheets (Taken 10/25/2023 1238) LTG: Pt will perform bed mobility with assistance level of: Supervision/Verbal cueing   Problem: RH Bed to Chair Transfers Goal: LTG Patient will perform bed/chair transfers w/assist (PT) Description: LTG: Patient will perform bed to chair transfers with assistance (PT). Flowsheets (Taken 10/25/2023 1238) LTG: Pt will perform Bed to Chair Transfers with assistance level: Supervision/Verbal cueing   Problem: RH Car Transfers Goal: LTG Patient will perform car transfers with assist (PT) Description: LTG: Patient will perform car transfers with assistance (PT). Flowsheets (Taken 10/25/2023 1238) LTG: Pt will perform car transfers with assist:: Supervision/Verbal cueing   Problem: RH Ambulation Goal: LTG Patient will ambulate in controlled environment (PT) Description: LTG: Patient will ambulate in a controlled environment, # of feet with assistance (PT). Flowsheets (Taken 10/25/2023 1238) LTG: Pt will ambulate in controlled environ  assist needed:: Supervision/Verbal cueing LTG: Ambulation distance in controlled  environment: 150' Goal: LTG Patient will ambulate in home environment (PT) Description: LTG: Patient will ambulate in home environment, # of feet with assistance (PT). Flowsheets (Taken 10/25/2023 1238) LTG: Pt will ambulate in home environ  assist needed:: Supervision/Verbal cueing LTG: Ambulation distance in home environment: 50'   Problem: RH Stairs Goal: LTG Patient will ambulate up and down stairs w/assist (PT) Description: LTG: Patient will ambulate up and down # of stairs with assistance (PT) Flowsheets (Taken 10/25/2023 1238) LTG: Pt will ambulate up/down stairs assist needed:: Contact Guard/Touching assist LTG: Pt will  ambulate up and down number of stairs: 4

## 2023-10-25 NOTE — Progress Notes (Addendum)
0800: RN tried to give pt morning medications, pt took the meds but spit the meds out afterwards. RN tried to educate pt on importance of taking his medications but pt stated "if you give me the medications I will keep spitting them out", RN notified Dr.Engler about situation.   Pt picking at scabs on head/scap and making them bleed at times, RN notified Dr.Engler   Pt had IV placed by IV team today, Pt removed IV later in shift, Pt confused and snapped IV tubing into pieces, Pt cleaned up, RN unsuccessful in replacing IV, IV team consulted for second time.  Pt went down for abdominal xray during shift.   1840: IV team came to place another IV

## 2023-10-25 NOTE — Progress Notes (Addendum)
Patient continues to have delirium. Lying in bed with eyes closed and appears in NAD but reports feeling "terrible"--everything hurts when questioned--belly, back, chest, lungs. Per reports continues to spit out pills administered and pulled out IV.  Oriented to self and place as [facility] hospital. Internally distracted and restless. Delirium precautions in place. Await sleep wake chart to help assess sleep hygiene. Will schedule tylenol qid to help with chronic pain. Will resume fluid orders as pending IV team assistance. Awaiting KUB--will change to portable.

## 2023-10-25 NOTE — Progress Notes (Signed)
PROGRESS NOTE   Subjective/Complaints: No events overnight.  Patient endorses + full ROS, including nausea/vomitting "all night" none recorded, constipation and diarrhea "all night", SOB, chest pain, fevers, cough, poor sleep, etc.   Reported 7 out of 10 back pain overnight. Significant for mildly elevated blood pressure 150s over 70s. .  A.m. labs significant for increasing creatinine 1.3-1.5, calcium 7.8, albumin 2.2, otherwise stable from priors.  ROS: Endorses all ROS questions as above; in no apparent distress  Objective:   No results found. Recent Labs    10/24/23 0247 10/25/23 0557  WBC 4.0 3.8*  HGB 8.1* 8.6*  HCT 26.2* 27.7*  PLT 173 167   Recent Labs    10/24/23 0247 10/25/23 0557  NA 143 136  K 3.1* 3.8  CL 112* 107  CO2 21* 20*  GLUCOSE 115* 110*  BUN 23 20  CREATININE 1.33* 1.54*  CALCIUM 7.9* 7.8*    Intake/Output Summary (Last 24 hours) at 10/25/2023 0809 Last data filed at 10/24/2023 2355 Gross per 24 hour  Intake 240 ml  Output 500 ml  Net -260 ml        Physical Exam: Vital Signs Blood pressure (!) 157/73, pulse 68, temperature 97.8 F (36.6 C), temperature source Oral, resp. rate 17, SpO2 90%.  Physical Exam Vitals and nursing note reviewed.   PE: Constitution: Appropriate appearance for age. No apparent distress. +Obese Resp: No respiratory distress. No accessory muscle usage. on RA and CTAB Cardio: Well perfused appearance. +tr peripheral edema. Capillary refill brisk.  Abdomen: Nondistended but protuberant. +BS. Nontender in all 4 quadrants.   Psych: Flat. Minimally interactive (continues to eat food throughout exam and interview) Skin: A lot of skin tears on UE"s- covered with dressings - d/c/I.  LUE wrist IV-  Hemosiderin straining and 1-2+ LE edema to mid calf B/L    Neuro: AAOx to self; place a swesly long; perseverates on 2015. + Perseveration, memory deficits DTRs:  Reflexes were 2+ throughout Babinsky: flexor responses b/l.   Hoffmans: negative b/l Sensory exam: revealed normal sensation in all dermatomal regions in BL UE and LE grossly Motor exam: All 4 extremities antigravity and against resistance Coordination: Fine motor coordination was grossly normal.        Assessment/Plan: 1. Functional deficits which require 3+ hours per day of interdisciplinary therapy in a comprehensive inpatient rehab setting. Physiatrist is providing close team supervision and 24 hour management of active medical problems listed below. Physiatrist and rehab team continue to assess barriers to discharge/monitor patient progress toward functional and medical goals  Care Tool:  Bathing              Bathing assist       Upper Body Dressing/Undressing Upper body dressing        Upper body assist      Lower Body Dressing/Undressing Lower body dressing            Lower body assist       Toileting Toileting    Toileting assist       Transfers Chair/bed transfer  Transfers assist           Locomotion Ambulation   Ambulation assist  Walk 10 feet activity   Assist           Walk 50 feet activity   Assist           Walk 150 feet activity   Assist           Walk 10 feet on uneven surface  activity   Assist           Wheelchair     Assist               Wheelchair 50 feet with 2 turns activity    Assist            Wheelchair 150 feet activity     Assist          Blood pressure (!) 157/73, pulse 68, temperature 97.8 F (36.6 C), temperature source Oral, resp. rate 17, SpO2 90%. Medical Problem List and Plan: 1. Functional deficits secondary to debility from GI bleed/delirium             -patient may  shower- cover skin tears             -ELOS/Goals: 12-14 days min A to supervision             - stable to continue CIR  2.   Antithrombotics: -DVT/anticoagulation:  Pharmaceutical: Other (comment)-_Pradaxa resumed on 01/21              -antiplatelet therapy: N/A 3. Pain Management: Tylenol prn.  4. Mood/Behavior/Sleep: LCSW to follow for evaluation and support.              -antipsychotic agents: N/A--did not tolerate Seroquel  5. Neuropsych/cognition: This patient is not capable of making decisions on his own behalf. 6. Skin/Wound Care: Routine pressure relief measures.  7. Fluids/Electrolytes/Nutrition: Monitor I/O check CMET in am   - 1/27: See below; 500 cc IVF today, encourage PO, recheck BMP in AM. Add calcium supplement  8. GIB/Duodenal AVM s/p APC:  To continue PPI BID X 4 weeks-->10/26/23   - HgB stable; see #18  9. Right hemothorax s/p CT/fibrinolysis: Has been weaned off oxygen             --follow up with Dr. Irena Cords in 2 months w/repeat CT chest 10. Delirium/intermittent movement d/o: Delirium precautions.  --Monitor sleep wake cycle.   11. PAF/SVT: HR improved with titrated of coreg to 12.5 mg bid. 12.  Hypokalemia/Hypomagnesemia/hypocalcemia: K+ 3.1 this AM- was given KCL- will recheck in AM   - 1/27: K stable/improved; will start Oscal 500 mg BID for Ca 7.8  13. HCAP: Treated with 7 day course of Zosyn 14. Gross hematuria: Has resolved--->to follow up with Dr. Annabell Howells on outpatient basis.  15. T2DM: Hgb A1c- 5.5. Diet controlled at this time             --has been off actos.  No results for input(s): "GLUCAP" in the last 72 hours.   16. Acute on chronic renal failure:  BUN/SCr improving.  --Followed by Dr. Zetta Bills w/plan for renal bx prior to admission.              --encourage fluid intake- Cr 1.33 this AM up from 1.0- will recheck in Ama nd push fluids.   1/27: Continues uptrending to 1.5 today; Is only 240 yesterday; Push PO fluids and give 500 cc bolus today, check in AM  17. Hx Pancytopenia:  Bone bx w/ ICUS (brewing myelodysplasia) per Dr. Leonides Schanz.    - 1/27:  WBC down slightly;  monitor  18. Abdominal pain/nausea   - KUB without concerning findings 1/27   - Zofran PRN added   - on protonix 40 mg BID   - Having regular BMs   LOS: 1 days A FACE TO FACE EVALUATION WAS PERFORMED  Angelina Sheriff 10/25/2023, 8:09 AM

## 2023-10-25 NOTE — Progress Notes (Signed)
Inpatient Rehabilitation Care Coordinator Assessment and Plan Patient Details  Name: Joshua [name] MRN: [id number] Date of Birth: 02/22/1951  Today's Date: 10/25/2023  Hospital Problems: Principal Problem:   Debility Active Problems:   Gastrointestinal hemorrhage with melena  Past Medical History:  Past Medical History:  Diagnosis Date   Arthritis    Atrial fibrillation (HCC)    Atrial flutter (HCC)    Back pain    CAD (coronary artery disease)    stents placed 2009   Cholelithiasis    Complication of anesthesia    woke up during surgery and ablation   DM (diabetes mellitus) (HCC)    type 2   Elevated PSA    Gout    Hearing problem    HTN (hypertension)    Hyperlipidemia    Myocardial infarct, old 2010   Prostatitis    Renal insufficiency    Past Surgical History:  Past Surgical History:  Procedure Laterality Date   CARDIAC CATHETERIZATION  11/07/07   with 2 stents   COLONOSCOPY     Electrophysiologic study and RF catheter ablation   of AV node reentrant tachycardia.     ESOPHAGOGASTRODUODENOSCOPY (EGD) WITH PROPOFOL N/A 09/30/2023   Procedure: ESOPHAGOGASTRODUODENOSCOPY (EGD) WITH PROPOFOL;  Surgeon: Joshua Bologna, MD;  Location: [facility];  Service: Gastroenterology;  Laterality: N/A;   EXTERNAL EAR SURGERY Right    cartilage   HOT HEMOSTASIS N/A 09/30/2023   Procedure: HOT HEMOSTASIS (ARGON PLASMA COAGULATION/BICAP);  Surgeon: Joshua Bologna, MD;  Location: [facility];  Service: Gastroenterology;  Laterality: N/A;   LEFT AND RIGHT HEART CATHETERIZATION WITH CORONARY/GRAFT ANGIOGRAM N/A 09/27/2014   Procedure: LEFT AND RIGHT HEART CATHETERIZATION WITH Isabel Caprice;  Surgeon: Joshua Noe, MD;  Location: [facility] CATH LAB;  Service: Cardiovascular;  Laterality: N/A;   LUMBAR LAMINECTOMY/DECOMPRESSION MICRODISCECTOMY Left 04/29/2018   Procedure: Left Lumbar Four-Five Laminectomy/Foraminotomy;  Surgeon: Joshua Alert, MD;  Location: [facility];  Service:  Neurosurgery;  Laterality: Left;  Left Lumbar Four-Five Laminectomy/Foraminotomy   TOOTH EXTRACTION     VASECTOMY     Social History:  reports that he has never smoked. He has quit using smokeless tobacco.  His smokeless tobacco use included chew. He reports that he does not drink alcohol and does not use drugs.  Family / Support Systems Marital Status: Married How Long?: 57 years Patient Roles: Spouse, Parent Spouse/Significant Other: Joshua [name] (wife) Children: 2 children- Joshua [name] and Joshua [name]. Reports both live in the home. Other Supports: none Anticipated Caregiver: wife Joshua [name] Ability/Limitations of Caregiver: Wife will be primary caregiver. SW will confirm support. Caregiver Availability: 24/7 Family Dynamics: Pt lives with his wife and two adult children.  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 SW informed  medical team. Recent Psychosocial Issues: see below Psychiatric History: pt reports he has anxiety and depression but does not take medication. When asked reasoning, states his wife  does not approve. Substance Abuse History: Denies tobacco product and rec druguse. Admits to occassional etoh use.  Patient / Family Perceptions, Expectations & Goals Pt/Family understanding of illness & functional limitations: Pt has a general understanding of care needs Premorbid pt/family roles/activities: Independent Anticipated changes in roles/activities/participation: Assistance with ADls/IADLs Pt/family expectations/goals: Pt goal is to work on "going home"  [employer]: None Premorbid Home Care/DME Agencies: None Transportation available at discharge: TBD Is the patient able to respond to transportation needs?: Yes In the past 12 months, has lack of transportation kept you from medical appointments or from getting medications?: No In the past 12 months, has lack of transportation kept you from meetings, work, or from getting things needed for daily living?: No Resource referrals recommended: Neuropsychology  Discharge Planning Living Arrangements: Spouse/significant other, Children Support Systems: Spouse/significant other, Children Type of Residence: Private residence Insurance Resources: Media planner (specify) Administrator Medicare) Financial Resources: Restaurant manager, fast food Screen Referred: No Living Expenses: Own Money Management: Patient Does the patient have any problems obtaining your medications?: No Home Management: Pt reports he managed all home care needs Patient/Family Preliminary Plans: TBD Care Coordinator Barriers to Discharge: Decreased caregiver support, Lack of/limited family support, Insurance for SNF coverage Care Coordinator Anticipated Follow Up Needs: HH/OP  Clinical Impression SW met with pt at bedside to introduce self, explain role, and discuss discharge process. Pt is not a Cytogeneticist. DME: RW, and SW will confirm bathroom DME.   Joshua [name] A Jetaun Colbath 10/25/2023, 3:00 PM

## 2023-10-26 LAB — BASIC METABOLIC PANEL
Anion gap: 11 (ref 5–15)
BUN: 16 mg/dL (ref 8–23)
CO2: 20 mmol/L — ABNORMAL LOW (ref 22–32)
Calcium: 8.3 mg/dL — ABNORMAL LOW (ref 8.9–10.3)
Chloride: 111 mmol/L (ref 98–111)
Creatinine, Ser: 1.31 mg/dL — ABNORMAL HIGH (ref 0.61–1.24)
GFR, Estimated: 57 mL/min — ABNORMAL LOW (ref >60)
Glucose, Bld: 100 mg/dL — ABNORMAL HIGH (ref 70–99)
Potassium: 3.3 mmol/L — ABNORMAL LOW (ref 3.5–5.1)
Sodium: 142 mmol/L (ref 135–145)

## 2023-10-26 MED ORDER — POTASSIUM CHLORIDE 20 MEQ PO PACK
20.0000 meq | PACK | Freq: Two times a day (BID) | ORAL | Status: AC
  Administered 2023-10-26: 20 meq via ORAL
  Filled 2023-10-26 (×2): qty 1

## 2023-10-26 MED ORDER — OLANZAPINE 5 MG PO TBDP
5.0000 mg | ORAL_TABLET | Freq: Two times a day (BID) | ORAL | Status: DC | PRN
Start: 2023-10-26 — End: 2023-11-03

## 2023-10-26 MED ORDER — OLANZAPINE 10 MG IM SOLR: 5.0000 mg | Freq: Two times a day (BID) | INTRAMUSCULAR | Status: DC | PRN

## 2023-10-26 MED ORDER — MIRTAZAPINE 15 MG PO TABS
15.0000 mg | ORAL_TABLET | Freq: Every day | ORAL | Status: DC
  Administered 2023-10-26 – 2023-10-27 (×2): 15 mg via ORAL
  Filled 2023-10-26 (×2): qty 1

## 2023-10-26 MED ORDER — QUETIAPINE FUMARATE 50 MG PO TABS: 50.0000 mg | ORAL_TABLET | Freq: Every day | ORAL | Status: DC

## 2023-10-26 MED ORDER — SIMETHICONE 80 MG PO CHEW
80.0000 mg | CHEWABLE_TABLET | Freq: Four times a day (QID) | ORAL | Status: DC
  Administered 2023-10-26 – 2023-11-10 (×53): 80 mg via ORAL
  Filled 2023-10-26 (×56): qty 1

## 2023-10-26 NOTE — Patient Care Conference (Signed)
Inpatient RehabilitationTeam Conference and Plan of Care Update Date: 10/26/2023   Time: 1036 am   Patient Name: Joshua [name]      Medical Record Number: [id number]  Date of Birth: 13-Aug-1951 Sex: Male         Room/Bed: 4W08C/4W08C-01 Payor Info: Payor: AET[employer] MEDICARE / Plan: AET[employer] MEDICARE HMO/PPO / Product Type: *No Product type* /    Admit Date/Time:  10/24/2023  1:08 PM  Primary Diagnosis:  Debility  Hospital Problems: Principal Problem:   Debility Active Problems:   Gastrointestinal hemorrhage with melena    Expected Discharge Date: Expected Discharge Date: 11/15/23  Team Members Present: Physician leading conference: Dr. Elijah Birk Social Worker Present: Cecile Sheerer, LCSWA Nurse Present: Chana Bode, RN;Christabell Loseke Shirlee Latch, RN PT Present: Darrold Span, PT OT Present: Jake Shark, OT SLP Present: Jeannie Done, SLP PPS Coordinator present : Fae Pippin, SLP     Current Status/Progress Goal Weekly Team Focus  Bowel/Bladder   incontinent of b/b [facility] 1/27 multiple bowel movements   regain continence   q4 toileting    Swallow/Nutrition/ Hydration               ADL's   max A LB, min A UB, eating and grooming and all with cues due to cog/processing and sequencing, SPT + 2 with RW with nursing safety plan STEDY + 2   Supervision/CGA   Balance, cog/safety and will need to know if family can provide adequate support and Family Educ needed    Mobility   +2 assist overall due to cognitive deficits, gait up to 20' with RW mod assist and WC follow   supervision short distance ambulatory but can downgrade based on progress  barriers: cognitive deficits, endurance; focus on gait training, therapeutic exercise, transfer training    Communication                Safety/Cognition/ Behavioral Observations  required min to mod assist for initaition during cognitive tasks, mod for basic medication management tasks, disoriented to date and situation, mod assist  for immediate recall   min assist   initiation, basic problem solving, orientation, recall of daily events, awareness of deficits    Pain   patient c/o pain in back and stomach PRN tylenol doesn't seem to address the issue. patient is also confused when expressing pain patient is relaxed   find source of patients discomfort   assess pain qshift and PRN    Skin   patient has several abrasions. Patient has pulled out several PIVs and is picking at wounds on arms and scalp.   keep wounds infection free  assess skin qshift and PRN      Discharge Planning:  Pt will discharge to home with wife as primary caregiver. Pt needs to be able to get up and ambulate on his own. PRN support from his children. SW will confirm there are no barriers to discharge.   Team Discussion: Patient was admitted post GI bleed with debility. Patient had abdominal pain; KUB negative. IV fluids were given due to acute kidney injury. Medication was adjusted  for restlessness, confusion and delirium.   Patient on target to meet rehab goals: no, Goals set for Supervision - contact guard. Currently requiring max assist with lower body care. Patient needs 2+ assist for stand-pivot transfers.    Patient is able to walk up to 20 ' with mod assist using a rolling walker.    *See Care Plan and progress notes for long and short-term goals.  Revisions to Treatment Plan:  Telesitter Seroquel added at hs  Teaching Needs: Safety, medications, transfers,skin care, toileting, etc.  Current Barriers to Discharge: Decreased caregiver support and cognitive deficits  Possible Resolutions to Barriers: Family training     Medical Summary Current Status: Medically complicated by hypertension, poor p.o. intakes, poor medical compliance, delirium, incontinence of bowel and bladder, abdominal pain, and AKI  Barriers to Discharge: Behavior/Mood;Cardiac Complications;Electrolyte abnormality;Medical stability;Incontinence;Morbid  Obesity;Uncontrolled Hypertension;Renal Insufficiency/Failure;Self-care education;Uncontrolled Pain   Possible Resolutions to [employer]: Delirium precautions and reestablish sleep-wake cycle with medications and daily stimulation, monitor labs for recurrent AKI and stability, adjust medications as needed for blood pressure control, timed toileting and limiting of bowel medications   Continued Need for Acute Rehabilitation Level of Care: The patient requires daily medical management by a physician with specialized training in physical medicine and rehabilitation for the following reasons: Direction of a multidisciplinary physical rehabilitation program to maximize functional independence : Yes Medical management of patient stability for increased activity during participation in an intensive rehabilitation regime.: Yes Analysis of laboratory values and/or radiology reports with any subsequent need for medication adjustment and/or medical intervention. : Yes   I attest that I was present, lead the team conference, and concur with the assessment and plan of the team.   [name] 10/26/2023, 2:40 PM

## 2023-10-26 NOTE — Progress Notes (Addendum)
Patient not sleeping during the night. Extremely agitated attempted to get out of bed multiple times. Not redirectable. Smeared excrement on bed rail. Patient taking off brief multiple times and pulled out IV. Nurse attempted to use mittens to prevent patient from scratching himself and pulling on brief, but patient removes mittens to continue behavior. Patient also thinks his wife is in the room. Nurse tried to reorient patient but patient keeps telling nurse to ask his wife questions.

## 2023-10-26 NOTE — Plan of Care (Signed)
  Problem: RH Cognition - SLP Goal: RH LTG Patient will demonstrate orientation with cues Description:  LTG:  Patient will demonstrate orientation to person/place/time/situation with cues (SLP)   Flowsheets (Taken 10/26/2023 1048) LTG Patient will demonstrate orientation to:  Person  Place  Time  Situation LTG: Patient will demonstrate orientation using cueing (SLP): Minimal Assistance - Patient > 75%   Problem: RH Problem Solving Goal: LTG Patient will demonstrate problem solving for (SLP) Description: LTG:  Patient will demonstrate problem solving for basic/complex daily situations with cues  (SLP) Flowsheets (Taken 10/26/2023 1048) LTG: Patient will demonstrate problem solving for (SLP): Basic daily situations LTG Patient will demonstrate problem solving for: Minimal Assistance - Patient > 75%   Problem: RH Memory Goal: LTG Patient will demonstrate ability for day to day (SLP) Description: LTG:   Patient will demonstrate ability for day to day recall/carryover during cognitive/linguistic activities with assist  (SLP) Flowsheets (Taken 10/26/2023 1048) LTG: Patient will demonstrate ability for day to day recall: New information LTG: Patient will demonstrate ability for day to day recall/carryover during cognitive/linguistic activities with assist (SLP): Minimal Assistance - Patient > 75%   Problem: RH Awareness Goal: LTG: Patient will demonstrate awareness during functional activites type of (SLP) Description: LTG: Patient will demonstrate awareness during functional activites type of (SLP) Flowsheets (Taken 10/26/2023 1048) Patient will demonstrate during cognitive/linguistic activities awareness type of: Intellectual LTG: Patient will demonstrate awareness during cognitive/linguistic activities with assistance of (SLP): Minimal Assistance - Patient > 75%

## 2023-10-26 NOTE — Evaluation (Signed)
Speech Language Pathology Assessment and Plan  Patient Details  Name: Joshua [name] MRN: [id number] Date of Birth: 01-26-1951  SLP Diagnosis: Cognitive Impairments  Rehab Potential: Good ELOS: 2/17   Today's Date: 10/26/2023 SLP Individual Time: 0907-1000 SLP Individual Time Calculation (min): 53 min  Hospital Problem: Principal Problem:   Debility Active Problems:   Gastrointestinal hemorrhage with melena  Past Medical History:  Past Medical History:  Diagnosis Date   Arthritis    Atrial fibrillation (HCC)    Atrial flutter (HCC)    Back pain    CAD (coronary artery disease)    stents placed 2009   Cholelithiasis    Complication of anesthesia    woke up during surgery and ablation   DM (diabetes mellitus) (HCC)    type 2   Elevated PSA    Gout    Hearing problem    HTN (hypertension)    Hyperlipidemia    Myocardial infarct, old 2010   Prostatitis    Renal insufficiency    Past Surgical History:  Past Surgical History:  Procedure Laterality Date   CARDIAC CATHETERIZATION  11/07/07   with 2 stents   COLONOSCOPY     Electrophysiologic study and RF catheter ablation   of AV node reentrant tachycardia.     ESOPHAGOGASTRODUODENOSCOPY (EGD) WITH PROPOFOL N/A 09/30/2023   Procedure: ESOPHAGOGASTRODUODENOSCOPY (EGD) WITH PROPOFOL;  Surgeon: Lynann Bologna, MD;  Location: [facility];  Service: Gastroenterology;  Laterality: N/A;   EXTERNAL EAR SURGERY Right    cartilage   HOT HEMOSTASIS N/A 09/30/2023   Procedure: HOT HEMOSTASIS (ARGON PLASMA COAGULATION/BICAP);  Surgeon: Lynann Bologna, MD;  Location: [facility];  Service: Gastroenterology;  Laterality: N/A;   LEFT AND RIGHT HEART CATHETERIZATION WITH CORONARY/GRAFT ANGIOGRAM N/A 09/27/2014   Procedure: LEFT AND RIGHT HEART CATHETERIZATION WITH Isabel Caprice;  Surgeon: Lesleigh Noe, MD;  Location: [facility] CATH LAB;  Service: Cardiovascular;  Laterality: N/A;   LUMBAR LAMINECTOMY/DECOMPRESSION MICRODISCECTOMY  Left 04/29/2018   Procedure: Left Lumbar Four-Five Laminectomy/Foraminotomy;  Surgeon: Tia Alert, MD;  Location: [facility] OR;  Service: Neurosurgery;  Laterality: Left;  Left Lumbar Four-Five Laminectomy/Foraminotomy   TOOTH EXTRACTION     VASECTOMY      Assessment / Plan / Recommendation Clinical Impression A 73 y.o. M with pAF on Pradaxa, CAD s/p PCI >5 yrs, DM, OSA, HTN, obesity, HLD, and CKD IIIa who presented to [facility] on 09/24/23 with dizziness, melena.  In the ER, found to have GIB, AKI.  Praxbind given.  GI consulted, and EGD showed AVMs.  Then developed worsening hypoxia, found to have a loculated pleural effusion, chest tube was placed and eventually removed successfully.  Hospital course also complicated by persistent encephalopathy/delirium, now resolved.  PT/OT evaluations were completed with recommendations for acute inpatient rehab admission.   Cognitive-Linguistic Evaluation: Patient presents with moderate deficits in the areas of initiation, processing speed, memory, attention, awareness of deficits, anticipatory awareness, executive functioning, and orientation as demonstrated throughout patient interview and administration of SLUMS examination. Patient scored 14/30 on SLUMS examination and showed relative strengths in the area of visuospatial skills. Patient would benefit from SLP services during admission to target aforementioned deficits. SLP will continue to follow.  Therapy Interventions: SLP conducted skilled therapy session targeting cognitive retraining goals. SLP provided patient with calendar orientation aids. Given min assist, patient set up calendars for ease of access and identified date using this aid after setup with mod assist. SLP guided patient through medication error identification task in  BID pill box worksheet. Patient identified all errors with 100% accuracy but required min to mod cues for initiation of responses and required significantly increased  processing time. Patient was left in lowered bed with call bell in reach and bed alarm set. SLP will continue to target goals per plan of care.     Skilled Therapeutic Interventions          SLP conducted skilled evaluation session to assess cognitive-linguistic function utilizing SLUMS and patient interview. Additionally provided 30 minutes of cognitive treatment during session. Full results and treatment interventions detailed above.    SLP Assessment  Patient will need skilled Speech Lanaguage Pathology Services during CIR admission    Recommendations  Oral Care Recommendations: Oral care BID Recommendations for Other Services: Neuropsych consult Patient destination: Home Follow up Recommendations: Home Health SLP;24 hour supervision/assistance Equipment Recommended: None recommended by SLP    SLP Frequency 3 to 5 out of 7 days   SLP Duration  SLP Intensity  SLP Treatment/Interventions 2/17  Minumum of 1-2 x/day, 30 to 90 minutes  Cognitive remediation/compensation;Cueing hierarchy;Functional tasks;Therapeutic Activities;Internal/external aids;Patient/family education    Pain Pain Assessment Pain Scale: Faces Faces Pain Scale: Hurts a little bit Pain Location: Abdomen  Prior Functioning Cognitive/Linguistic Baseline: Information not available Type of Home: House  Lives With: Spouse Available Help at Discharge: Family;Available 24 hours/day Vocation: Retired  Architectural technologist Overall Cognitive Status: Impaired/Different from baseline Arousal/Alertness: Awake/alert Orientation Level: Disoriented to place;Disoriented to time;Disoriented to situation Year: 2025 Month: January Day of Week: Incorrect Attention: Focused;Sustained Focused Attention: Impaired Focused Attention Impairment: Functional basic Sustained Attention: Impaired Sustained Attention Impairment: Functional basic Memory: Impaired Memory Impairment: Decreased recall of new information;Decreased  short term memory Decreased Short Term Memory: Functional basic Awareness: Impaired Awareness Impairment: Intellectual impairment;Emergent impairment;Anticipatory impairment Problem Solving: Impaired Problem Solving Impairment: Verbal basic;Functional basic Executive Function: Sequencing;Reasoning;Self Monitoring;Self Correcting Reasoning: Impaired Reasoning Impairment: Verbal basic;Functional basic Sequencing: Impaired Sequencing Impairment: Verbal basic;Functional basic Self Monitoring: Impaired Self Monitoring Impairment: Verbal basic;Functional basic Self Correcting: Impaired Self Correcting Impairment: Verbal basic;Functional basic Safety/Judgment: Impaired  Comprehension Auditory Comprehension Overall Auditory Comprehension: Appears within functional limits for tasks assessed Expression Expression Primary Mode of Expression: Verbal Verbal Expression Overall Verbal Expression: Appears within functional limits for tasks assessed Oral Motor Oral Motor/Sensory Function Overall Oral Motor/Sensory Function: Within functional limits Motor Speech Overall Motor Speech: Appears within functional limits for tasks assessed  Care Tool Care Tool Cognition Ability to hear (with hearing aid or hearing appliances if normally used Ability to hear (with hearing aid or hearing appliances if normally used): 0. Adequate - no difficulty in normal conservation, social interaction, listening to TV   Expression of Ideas and Wants Expression of Ideas and Wants: 2. Frequent difficulty - frequently exhibits difficulty with expressing needs and ideas   Understanding Verbal and Non-Verbal Content Understanding Verbal and Non-Verbal Content: 3. Usually understands - understands most conversations, but misses some part/intent of message. Requires cues at times to understand  Memory/Recall Ability Memory/Recall Ability : Current season;That he or she is in a hospital/hospital unit   Short Term  Goals: Week 1: SLP Short Term Goal 1 (Week 1): Patient will orient to date and location using external aids in 4/5 opportunities given mod assist. SLP Short Term Goal 2 (Week 1): Patient will solve basic environmental problems with 75% accuracy given mod assist. SLP Short Term Goal 3 (Week 1): Patient will demonstrate awareness of deficits by stating 2 cognitive and 2 physical deficits given max  assist. SLP Short Term Goal 4 (Week 1): Patient will utilize external aids to recall daily events with 75% accuracy given mod assist.  Refer to Care Plan for Long Term Goals  Recommendations for other services: Neuropsych  Discharge Criteria: Patient will be discharged from SLP if patient refuses treatment 3 consecutive times without medical reason, if treatment goals not met, if there is a change in medical status, if patient makes no progress towards goals or if patient is discharged from hospital.  The above assessment, treatment plan, treatment alternatives and goals were discussed and mutually agreed upon: by patient  Jeannie Done, M.A., CCC-SLP  Yetta Barre 10/26/2023, 12:22 PM

## 2023-10-26 NOTE — Progress Notes (Signed)
Attempting medication crushed in apple sauce. However, spitting medication up immediately. Unable to swallow medication with apple sauce taste not being palpable. Decreased energy resting. Not wanting to eat lunch with prompts. Endorsing decrease in appetite. Does express slight improvement with nausea sensation reported earlier today.

## 2023-10-26 NOTE — Progress Notes (Signed)
PROGRESS NOTE   Subjective/Complaints: Overnight, reports of restlessness and delirium, continues to spit medications both whole and crushed in applesauce.  On exam, is fully oriented, but states that he is drunk, was drinking only yesterday, drink all alcohols constantly at home.  Per chart review, no notable history of substance use, attempted to call wife to confirm this but went to voicemail.  KUB yesterday unrevealing.  Did pull IV overnight, 426 cc of IV fluid and p.o. intakes for approximately 1 L total.  AKI improved today, K slightly low. He continues to spit medication. Large and medium incontinent bowel movements this a.m.  Nursing does report nonreactive left pupil today, however on exam this is not appreciated.  ROS: Endorses all ROS questions equally, including abdominal pain, chest pain, fever, chills, nausea, vomiting, diarrhea, constipation, and feeling "drunk"  Objective:   DG Abd 1 View Result Date: 10/25/2023 CLINICAL DATA:  Abdominal pain EXAM: ABDOMEN - 1 VIEW COMPARISON:  None Available. FINDINGS: Scattered large and small bowel gas is noted. No obstructive changes are seen. No free air is noted. Mild degenerative changes of lumbar spine are seen. IMPRESSION: No acute abnormality noted. Electronically Signed   By: Alcide Clever M.D.   On: 10/25/2023 21:13   Recent Labs    10/24/23 0247 10/25/23 0557  WBC 4.0 3.8*  HGB 8.1* 8.6*  HCT 26.2* 27.7*  PLT 173 167   Recent Labs    10/25/23 0557 10/26/23 0504  NA 136 142  K 3.8 3.3*  CL 107 111  CO2 20* 20*  GLUCOSE 110* 100*  BUN 20 16  CREATININE 1.54* 1.31*  CALCIUM 7.8* 8.3*    Intake/Output Summary (Last 24 hours) at 10/26/2023 1610 Last data filed at 10/25/2023 2200 Gross per 24 hour  Intake 1233.91 ml  Output --  Net 1233.91 ml        Physical Exam: Vital Signs Blood pressure (!) 149/78, pulse 68, temperature 98 F (36.7 C), temperature  source Oral, resp. rate 18, SpO2 94%.  Physical Exam Vitals and nursing note reviewed.   PE: Constitution: Appropriate appearance for age. No apparent distress. +Obese.  Sitting on toilet. Resp: No respiratory distress. No accessory muscle usage. on RA and CTAB Cardio: Well perfused appearance. No peripheral edema. Capillary refill brisk.  Abdomen: Nondistended but protuberant. +BS. Nontender in all 4 quadrants.   Psych: Flat. Minimally interactive, answers all questions and participate but with far off looks.  Intermittent hallucinations and delusional content.  Skin: A lot of skin tears on UE"s and bruising- covered with dressings - d/c/I.  LUE wrist IV-  Hemosiderin straining and 1-2+ LE edema to mid calf B/L    Neuro: AAOx to self, place, time, and year with cues.  Can follow all simple commands. + Poor attention, perseveration, memory deficits Reflexes intact Sensory exam: revealed normal sensation in all dermatomal regions in BL UE and LE grossly Motor exam: All 4 extremities antigravity and against resistance Coordination: Fine motor coordination was grossly normal.   Cranial nerves II through XII intact. Pupils equal round reactive to light and accommodation, EOMI.  No apparent nystagmus.      Assessment/Plan: 1. Functional deficits which  require 3+ hours per day of interdisciplinary therapy in a comprehensive inpatient rehab setting. Physiatrist is providing close team supervision and 24 hour management of active medical problems listed below. Physiatrist and rehab team continue to assess barriers to discharge/monitor patient progress toward functional and medical goals  Care Tool:  Bathing    Body parts bathed by patient: Right arm, Left arm, Chest, Abdomen, Face   Body parts bathed by helper: Front perineal area, Buttocks, Right upper leg, Left upper leg, Right lower leg, Left lower leg     Bathing assist Assist Level: Maximal Assistance - Patient 24 - 49%      Upper Body Dressing/Undressing Upper body dressing   What is the patient wearing?: Pull over shirt    Upper body assist Assist Level: Minimal Assistance - Patient > 75%    Lower Body Dressing/Undressing Lower body dressing      What is the patient wearing?: Incontinence brief, Pants     Lower body assist Assist for lower body dressing: Maximal Assistance - Patient 25 - 49%     Toileting Toileting    Toileting assist Assist for toileting: Total Assistance - Patient < 25%     Transfers Chair/bed transfer  Transfers assist  Chair/bed transfer activity did not occur: Safety/medical concerns (unsafe to get up)  Chair/bed transfer assist level: 2 Helpers     Locomotion Ambulation   Ambulation assist   Ambulation activity did not occur: Safety/medical concerns          Walk 10 feet activity   Assist  Walk 10 feet activity did not occur: Safety/medical concerns        Walk 50 feet activity   Assist Walk 50 feet with 2 turns activity did not occur: Safety/medical concerns         Walk 150 feet activity   Assist Walk 150 feet activity did not occur: Safety/medical concerns         Walk 10 feet on uneven surface  activity   Assist Walk 10 feet on uneven surfaces activity did not occur: Safety/medical concerns         Wheelchair     Assist Is the patient using a wheelchair?: Yes Type of Wheelchair: Manual    Wheelchair assist level: Dependent - Patient 0%      Wheelchair 50 feet with 2 turns activity    Assist        Assist Level: Dependent - Patient 0%   Wheelchair 150 feet activity     Assist      Assist Level: Dependent - Patient 0%   Blood pressure (!) 149/78, pulse 68, temperature 98 F (36.7 C), temperature source Oral, resp. rate 18, SpO2 94%. Medical Problem List and Plan: 1. Functional deficits secondary to debility from GI bleed/delirium             -patient may  shower- cover skin tears              -ELOS/Goals: 12-14 days min A to supervision-2-17 discharge date             - stable to continue CIR   - 1-28: If participatory with therapies and able to follow commands, but remains very confused, with active hallucination.  2.  Antithrombotics: -DVT/anticoagulation:  Pharmaceutical: Other (comment)-_Pradaxa resumed on 01/21              -antiplatelet therapy: N/A 3. Pain Management: Tylenol prn.  4. Mood/Behavior/Sleep/delirium: LCSW to follow for evaluation and support.              -  antipsychotic agents: N/A--did not tolerate Seroquel    --Monitor sleep wake cycle. -Reminded nursing to add sleep log 1-28:  Did poorly with scheduled nightly Seroquel on inpatient due to daytime somnolence.  Add mirtazapine 15 mg nightly, keep melatonin 5 mg as needed.  5. Neuropsych/cognition: This patient is not capable of making decisions on his own behalf.   - 1-28: Adding telemonitoring, 4 side rails for safety.  Add Zyprexa 5 mg sublingual versus IM as needed for agitation, given patient is spitting most medication.  Restraints as above.   6. Skin/Wound Care: Routine pressure relief measures.    -Monitor of skin tears, bruises.  7. Fluids/Electrolytes/Nutrition: Monitor I/O check CMET in am   - 1/27: See below; 500 cc IVF today, encourage PO, recheck BMP in AM. Add calcium supplement  -1-28: Labs improved, encourage p.o.'s.  8. GIB/Duodenal AVM s/p APC:  To continue PPI BID X 4 weeks-->10/26/23   - HgB stable; see #18  9. Right hemothorax s/p CT/fibrinolysis: Has been weaned off oxygen             --follow up with Dr. Irena Cords in 2 months w/repeat CT chest  10. Delirium/intermittent movement d/o: Delirium precautions.  - see #4  11. PAF/SVT: HR improved with titrated of coreg to 12.5 mg bid.  -Will have to monitor very closely with regular refusal of medications; so far pulse remains stable.  12.  Hypokalemia/Hypomagnesemia/hypocalcemia: K+ 3.1 this AM- was given KCL- will recheck in AM    - 1/27: K stable/improved; will start Oscal 500 mg BID for Ca 7.8  1-28: K3.3, start 20 mill equivalents potassium twice daily.  Retest later this week.  13. HCAP: Treated with 7 day course of Zosyn   - No respiratory distress 14. Gross hematuria: Has resolved--->to follow up with Dr. Annabell Howells on outpatient basis.  15. T2DM: Hgb A1c- 5.5. Diet controlled at this time             --has been off actos.  No results for input(s): "GLUCAP" in the last 72 hours.    16. Acute on chronic renal failure -CKD stage IIIb:  BUN/SCr improving.  --Followed by Dr. Zetta Bills w/plan for renal bx prior to admission.              --encourage fluid intake- Cr 1.33 this AM up from 1.0- will recheck in Ama nd push fluids.   1/27: Continues uptrending to 1.5 today; Is only 240 yesterday; Push PO fluids and give 500 cc bolus today, check in AM  1-28: Creatinine improved to 1.3.  P.o. intakes remain poor, will continue daily monitoring and bolus as needed  17. Hx Pancytopenia:  Bone bx w/ ICUS (brewing myelodysplasia) per Dr. Leonides Schanz.    - 1/27: WBC down slightly; monitor  18. Abdominal pain/nausea   - KUB without concerning findings 1/27   - Zofran PRN added   - on protonix 40 mg BID   - Having regular Bms 1-28: Multiple incontinent bowel movements overnight.  Tylenol scheduled for generalized pain.  19.  Bladder incontinence.  PVRs next 2 days.  Labs not concerning for infection.  On Flomax 0.4 mg daily   LOS: 2 days A FACE TO FACE EVALUATION WAS PERFORMED  Angelina Sheriff 10/26/2023, 9:21 AM

## 2023-10-26 NOTE — Progress Notes (Signed)
Physical Therapy Session Note  Patient Details  Name: Joshua [name] MRN: [id number] Date of Birth: Oct 28, 1950  Today's Date: 10/26/2023 PT Individual Time: 4098-1191 PT Individual Time Calculation (min): 70 min   Short Term Goals: Week 1:  PT Short Term Goal 1 (Week 1): Pt will complete bed mobility with mod assist and min verbal cues PT Short Term Goal 2 (Week 1): Pt will complete transfers with mod assist and LRAD PT Short Term Goal 3 (Week 1): Pt will ambulate 58' with mod assist  Skilled Therapeutic Interventions/Progress Updates:    Pt presents in room in bed, agreeable to PT with encouragement, pt reporting pain in abdomen. Pt continues to perseverate on need to use restroom throughout session however pt unable to void volitionally during session. Pt does demonstrate slightly improved cognition this session.Session focused on gait training for short distances with RW, transfer training, and therapeutic exercise to promote improved BLE strengthening and tolerance to upright.  Pt completes bed mobility with mod assist and max elevated HOB with mod verbal cues for attention to task and sequencing. Pt completes sit to stand to RW with min assist and completes stand step transfer to [facility] with min assist for postural stability and RW mgmt for turning, mod verbal cues for sequencing. Pt reports needing to use restroom, stands to RW and able to maintain standing 2 min with CGA and BUE support on RW for therapist to assess and change brief with brief poorly fitted although no incontinence noted.   Pt transported to day room. Pt completes therapeutic exercise to promote BLE strengthening and postural stability including: - sit<>stands x10 to RW - marching alternating BLE x20 - heel raise x15 - mini squats x15 and x5 with max verbal cues for technique  Pt reports needing to use restroom, taken back to room during session and completes stand step transfer with RW with min assist and cues for  positioning, therapist total assist for managing pants and brief in standing with pt demonstrating improved standing balance. Pt comes to sitting on BSC, unable to void volitionally but noted to be incontinent of brief, charted. Therapist prompts pt to stand to void in urinal however pt maintains standing for 1 min with therapist managing urinal in place for pt to maintain standing balance, pt continues to be unable to void volitionally despite continuous verbalizations of urinary urgency. Therapist assist to don brief and pants total assist with CGA for psotural stability, pt completes stand step transfer back to [facility] with min assist and RW.  Pt transported to main gym and completes mass practice gait training short distance 6x10' from [facility] to sit on mat. Pt demonstrating significant improvement in postural stability, COG within BOS, and RW mgmt as well as attention to task, however requires max verbal cues for turning RW to sit on mat/WC as pt attempts to turn self within RW. Pt provided with cues and education for problem solving and sequencing with pt demonstrating improved ability to manage RW with each trial however continues to run into sitting surface with RW prior to turning.  Pt provided with seated rest breaks between all gait trials and exercises to promote energy conservation and quality with tasks. Therapist attempts to engage pt in conversation during rest breaks however pt minimally responsive.  Pt returns to room and completes ambulatory transfer with RW min assist back to bed. Pt requires max verbal/tactile cues and manual facilitation for bed mobility as pt unable to follow verbal commands for sequencing bed mobility,  requiring max assist. Pt remains supine in bed with all needs within reach, cal light in place and bed alarm activated at end of session.   Therapy Documentation Precautions:  Precautions Precautions: Fall Precaution Comments: continous spitting Restrictions Weight Bearing  Restrictions Per Provider Order: No   Therapy/Group: Individual Therapy  Edwin Cap PT, DPT 10/26/2023, 2:51 PM

## 2023-10-26 NOTE — Progress Notes (Signed)
Occupational Therapy Session Note  Patient Details  Name: Joshua [name] MRN: [id number] Date of Birth: 01/24/1951  Today's Date: 10/26/2023 OT Individual Time: 1135-1156 OT Individual Time Calculation (min): 21 min (9 missed minutes due to phone call)  Short Term Goals: Week 1:  OT Short Term Goal 1 (Week 1): Pt will stand sink side level for 2 min for grooming with min cues OT Short Term Goal 2 (Week 1): Pt will complete LB self care with min A with cues and AE as needed OT Short Term Goal 3 (Week 1): Pt will transfer to and from TTB and toilet with grab bars with min A and min cues  Skilled Therapeutic Interventions/Progress Updates:  Pt greeted falling asleep in TIS [facility] for skilled OT session with focus on functional transfers and attention to functional tasks.   Pain: Pt with un-rated pain in abdomin, OT offering intermediate rest breaks and positioning suggestions throughout session to address pain/fatigue and maximize participation/safety in session.   Pt unable to maintain level of alertness for active participation in session. Agreeable to return to bed for safety as patient falling asleep in TIS WC and sliding forward. Stedy transfer at [employer] A (+2 present for safety), sit>supine with Mod A for BLE elevation into bed. Extra time required for patient to process command to lower trunk onto bed. Pt receiving phone call at end of session, further activities deferred (~9 mins missed) as patient speaking with loved one and no longer attentive to clinician.   Pt remained resting in bed with 4Ps assessed and immediate needs met. Pt continues to be appropriate for skilled OT intervention to promote further functional independence in ADLs/IADLs.   Therapy Documentation Precautions:  Precautions Precautions: Fall Precaution Comments: continous spitting Restrictions Weight Bearing Restrictions Per Provider Order: No   Therapy/Group: Individual Therapy  Lou Cal, OTR/L,  MSOT  10/26/2023, 5:55 AM

## 2023-10-26 NOTE — Progress Notes (Incomplete)
Occupational Therapy Session Note  Patient Details  Name: Joshua [name] MRN: [id number] Date of Birth: 1951-08-29  {CHL IP REHAB OT TIME CALCULATIONS:[id number]}   Short Term Goals: Week 1:  OT Short Term Goal 1 (Week 1): Pt will stand sink side level for 2 min for grooming with min cues OT Short Term Goal 2 (Week 1): Pt will complete LB self care with min A with cues and AE as needed OT Short Term Goal 3 (Week 1): Pt will transfer to and from TTB and toilet with grab bars with min A and min cues  Skilled Therapeutic Interventions/Progress Updates:    Patient agreeable to participate in OT session. Reports *** pain level.   Patient participated in skilled OT session focusing on ***. Therapist facilitated/assessed/developed/educated/integrated/elicited *** in order to improve/facilitate/promote    Therapy Documentation Precautions:  Precautions Precautions: Fall Precaution Comments: continous spitting Restrictions Weight Bearing Restrictions Per Provider Order: No   Therapy/Group: Individual Therapy  Limmie Patricia, OTR/L,CBIS  Supplemental OT - [facility] and [facility] Secure Chat Preferred   10/26/2023, 10:04 PM

## 2023-10-26 NOTE — Progress Notes (Signed)
Physical Therapy Session Note  Patient Details  Name: Joshua [name] MRN: [id number] Date of Birth: 04/10/1951  Today's Date: 10/26/2023 PT Individual Time: 1030-1115 PT Individual Time Calculation (min): 45 min   Short Term Goals: Week 1:  PT Short Term Goal 1 (Week 1): Pt will complete bed mobility with mod assist and min verbal cues PT Short Term Goal 2 (Week 1): Pt will complete transfers with mod assist and LRAD PT Short Term Goal 3 (Week 1): Pt will ambulate 36' with mod assist  Skilled Therapeutic Interventions/Progress Updates:     Pt received seated in [facility] and agrees to therapy. Reports "pain in tummy". Number not provided. PT provides rest breaks as needed to manage pain. WC transport to gym. Pt performs sit to stand with minA and cues for hand placement and sequencing. Pt ambulates x40' with RW and minA, with cues for upright posture to improve balance and safety. Pt verbalizes need to have bowel movement and urinate. WC transport back to room. Pt ambulates x10' to toilet with minA and requires modA for safe transfer onto toilet. Pt is unable to void bladder or bowels. Sit to stand with modA from toilet. PT assists to don clean brief in standing. Pt ambulates back to WC with minA. Left seated with alarm intact and all needs within reach.  Therapy Documentation Precautions:  Precautions Precautions: Fall Precaution Comments: continous spitting Restrictions Weight Bearing Restrictions Per Provider Order: No   Therapy/Group: Individual Therapy  Beau Fanny, PT, DPT 10/26/2023, 4:03 PM

## 2023-10-26 NOTE — Care Management (Signed)
Inpatient Rehabilitation Center Individual Statement of Services  Patient Name:  Joshua [name]  Date:  10/26/2023  Welcome to the Inpatient Rehabilitation Center.  Our goal is to provide you with an individualized program based on your diagnosis and situation, designed to meet your specific needs.  With this comprehensive rehabilitation program, you will be expected to participate in at least 3 hours of rehabilitation therapies Monday-Friday, with modified therapy programming on the weekends.  Your rehabilitation program will include the following services:  Physical Therapy (PT), Occupational Therapy (OT), 24 hour per day rehabilitation nursing, Therapeutic Recreaction (TR), Psychology, Neuropsychology, Care Coordinator, Rehabilitation Medicine, Nutrition Services, Pharmacy Services, and Other  Weekly team conferences will be held on Tuesdays to discuss your progress.  Your Inpatient Rehabilitation Care Coordinator will talk with you frequently to get your input and to update you on team discussions.  Team conferences with you and your family in attendance may also be held.  Expected length of stay: 2-3 weeks    Overall anticipated outcome: Supervision  Depending on your progress and recovery, your program may change. Your Inpatient Rehabilitation Care Coordinator will coordinate services and will keep you informed of any changes. Your Inpatient Rehabilitation Care Coordinator's name and contact numbers are listed  below.  The following services may also be recommended but are not provided by the Inpatient Rehabilitation Center:  Driving Evaluations Home Health Rehabiltiation Services Outpatient Rehabilitation Services Vocational Rehabilitation   Arrangements will be made to provide these services after discharge if needed.  Arrangements include referral to agencies that provide these services.  Your insurance has been verified to be:  [employer]  Your primary doctor is:  Jarome Matin  Pertinent information will be shared with your doctor and your insurance company.  Inpatient Rehabilitation Care Coordinator:  Susie Cassette [phone] or [phone]  Information discussed with and copy given to patient by: Gretchen Short, 10/26/2023, 9:30 AM

## 2023-10-26 NOTE — Progress Notes (Signed)
Providers contacted about patient and on-going complaints experiencing. Continues to endorse sensation of nausea. No stomach pain. No vomitus. No grimacing or discomfort reported by patient with palpation of abdomen. Bowel sounds are active. No visual or tactile disturbances experienced by patient at this time. Slight restlessness and fidgeting observed by patient. Does endorse chest pain to upper left area. Per notes patient endorsing chest pain day prior. Chest pain was able to be reproduced with movement and palpation. Pain not radiating down any extremities. Expresses sensation of "being drunk" and sensitivity to light. Charge Nurse made aware and same reaction with right pupil. Pupils were equal in shape. Patient attempting to take medication but spitting them back out in a cup.

## 2023-10-26 NOTE — Progress Notes (Signed)
Patient ID: Joshua [name], male   DOB: September 07, 1951, 73 y.o.   MRN: [id number]  [phone] SW spoke with pt wife [name] to provide updates from team conference, and d/c date 2/17. She confirmed there will be some support from her son and daughter as well to help assist. She is aware SW will follow-up with updates.   Cecile Sheerer, MSW, LCSW Office: [phone] Cell: [phone] Fax: [phone]

## 2023-10-27 LAB — URINALYSIS, W/ REFLEX TO CULTURE (INFECTION SUSPECTED)
Bacteria, UA: NONE SEEN
Bilirubin Urine: NEGATIVE
Glucose, UA: 50 mg/dL — AB
Ketones, ur: NEGATIVE mg/dL
Leukocytes,Ua: NEGATIVE
Nitrite: NEGATIVE
Protein, ur: >=300 mg/dL — AB
Specific Gravity, Urine: 1.009 (ref 1.005–1.030)
pH: 5 (ref 5.0–8.0)

## 2023-10-27 MED ORDER — HYDRALAZINE HCL 10 MG PO TABS: 10.0000 mg | ORAL_TABLET | Freq: Three times a day (TID) | ORAL | Status: DC | PRN

## 2023-10-27 MED ORDER — TRAZODONE HCL 50 MG PO TABS
50.0000 mg | ORAL_TABLET | Freq: Every evening | ORAL | Status: DC | PRN
  Administered 2023-10-28: 50 mg via ORAL
  Filled 2023-10-27: qty 1

## 2023-10-27 MED ORDER — IRBESARTAN 75 MG PO TABS
37.5000 mg | ORAL_TABLET | Freq: Every day | ORAL | Status: DC
Start: 2023-10-27 — End: 2023-11-01
  Administered 2023-10-28 – 2023-11-01 (×5): 37.5 mg via ORAL
  Filled 2023-10-27 (×5): qty 1

## 2023-10-27 MED ORDER — MELATONIN 5 MG PO TABS
5.0000 mg | ORAL_TABLET | Freq: Every day | ORAL | Status: DC
  Administered 2023-10-27 – 2023-11-09 (×14): 5 mg via ORAL
  Filled 2023-10-27 (×14): qty 1

## 2023-10-27 NOTE — Plan of Care (Signed)
  Problem: Consults Goal: RH GENERAL PATIENT EDUCATION Description: See Patient Education module for education specifics. Outcome: Progressing   Problem: RH BOWEL ELIMINATION Goal: RH STG MANAGE BOWEL WITH ASSISTANCE Description: STG Manage Bowel with minimal assistance Assistance. Outcome: Progressing   Problem: RH BLADDER ELIMINATION Goal: RH STG MANAGE BLADDER WITH ASSISTANCE Description: STG Manage Bladder With minimal  Assistance Outcome: Progressing   Problem: RH SKIN INTEGRITY Goal: RH STG SKIN FREE OF INFECTION/BREAKDOWN Description: Manage skin infection / breakdown with educational materials with minimal assistance Outcome: Progressing Goal: RH STG MAINTAIN SKIN INTEGRITY WITH ASSISTANCE Description: STG Maintain Skin Integrity With minimal  Assistance. Outcome: Progressing   Problem: RH SAFETY Goal: RH STG ADHERE TO SAFETY PRECAUTIONS W/ASSISTANCE/DEVICE Description: STG Adhere to Safety Precautions With minimal Assistance/Device. Outcome: Progressing   Problem: RH PAIN MANAGEMENT Goal: RH STG PAIN MANAGED AT OR BELOW PT'S PAIN GOAL Description: <4 w/ prns Outcome: Progressing   Problem: RH KNOWLEDGE DEFICIT GENERAL Goal: RH STG INCREASE KNOWLEDGE OF SELF CARE AFTER HOSPITALIZATION Description:  Patient manage  self care at discharge using educational materials with minimal assistance   Outcome: Progressing   Problem: Safety: Goal: Non-violent Restraint(s) Outcome: Progressing

## 2023-10-27 NOTE — Progress Notes (Signed)
Physical Therapy Session Note  Patient Details  Name: Joshua [name] MRN: [id number] Date of Birth: [birth year]  Today's Date: 10/27/2023 PT Individual Time: 0920-1030 PT Individual Time Calculation (min): 70 min   Short Term Goals: Week 1:  PT Short Term Goal 1 (Week 1): Pt will complete bed mobility with mod assist and min verbal cues PT Short Term Goal 2 (Week 1): Pt will complete transfers with mod assist and LRAD PT Short Term Goal 3 (Week 1): Pt will ambulate 71' with mod assist  Skilled Therapeutic Interventions/Progress Updates:    Pt presents in room in [facility], with telesitter alarm going off secondary to pt with episode of emesis and choking. Pt demonstrating having had emesis onto shirt with intact pills noted in emesis, RN present and notes this. Pt agreeable to therapy, does not report pain. Session focused on therapeutic activities to facilitate participation with self care tasks and gait training.  Pt doffs shirt with max verbal cues for sequencing but able to complete with min assist. Pt dons shirt with mod verbal cues and min assist. Pt requesting to use restroom, positioned in bathroom via WC and completes stand step transfer with RW max verbal cues for sequencing and RW mgmt. Pt requires max cues and assist with hand placement for pulling pants down over hips prior to sitting on toilet. Pt requires time in sitting position then reports he won't be able to void his bladder while seated. Pt stands to RW and able to manage placing urinal while standing with CGA/min assist for balance. Pt continues to be unable to void volitionally while in standing. Therapist assist with donning pants and brief with max assist while in standing and requires min assist and max verbal/tactile/visual cues for turning to sit in WC.  Pt transported to day room via [facility] for time management. Pt completes ambulating between mat and WC x4 trials with RW min/CGA with mod assist for verbal cues to turn to sit on mat  or WC. Pt then ambulates with RW and CGA around cone placed 10' away with min cues for turning around cone but continues to require mod cues for turning to sit in WC. Pt then completes obstacle course navigating around 2 cones placed 3' apart with RW CGA with min cues for attention to task and mod cues for turning to sit in WC. Completed above activities to improve postural stability with directional changes as well as improve ability to manage RW for turning with transfers. Pt then completes 3x50' gait trial with RW CGA/min assist, demonstrating downward gaze and requires cues for attention to task, cues for turning to sit in WC. Pt provided with seated rest breaks between all gait trials to promote energy conservation and quality with tasks.  Pt reports during session that he is feeling pain in chest, when asked if he had pain anywhere else he indicated having pain in BUEs/BLEs. Pt also reports feeling dizziness and headache. Pt vitals assessed to be WNL BP 146/81, HR 69. RN notified.  Pt returns to room and remains seated in [facility] with all needs within reach, cal light in place and chair alarm donned and activated at end of session.   Therapy Documentation Precautions:  Precautions Precautions: Fall Precaution Comments: continous spitting Restrictions Weight Bearing Restrictions Per Provider Order: No   Therapy/Group: Individual Therapy  Edwin Cap PT, DPT 10/27/2023, 12:55 PM

## 2023-10-27 NOTE — IPOC Note (Signed)
Overall Plan of Care [facility]) Patient Details Name: Joshua [name] MRN: [id number] DOB: 1951/03/13  Admitting Diagnosis: Debility  Hospital Problems: Principal Problem:   Debility Active Problems:   Gastrointestinal hemorrhage with melena     Functional Problem List: Nursing Bowel, Bladder, Endurance, Safety, Pain, Skin Integrity, Medication Management  PT Safety, Balance, Behavior, Endurance, Motor, Nutrition, Perception, Sensory, Pain  OT Balance, Motor, Sensory, Behavior, Nutrition, Skin Integrity, Cognition, Pain, Endurance, Safety  SLP Cognition, Safety  TR         Basic ADL's: OT Eating, Grooming, Bathing, Dressing, Toileting     Advanced  ADL's: OT Simple Meal Preparation, Laundry, Light Housekeeping     Transfers: PT Bed Mobility, Bed to Chair, Customer service manager, Tub/Shower     Locomotion: PT Ambulation, Stairs, Wheelchair Mobility     Additional Impairments: OT None  SLP Social Cognition   Problem Solving, Memory, Attention, Awareness  TR      Anticipated Outcomes Item Anticipated Outcome  Self Feeding mod I  Swallowing      Basic self-care  Supervision  Toileting  Supervision   Bathroom Transfers Supervision/CGA  Bowel/Bladder  manage bowel with mod assistance/ manage bladder with mod assistance  Transfers  supervision  Locomotion  supervision ambulatory  Communication     Cognition  minA  Pain  <4 w/ prns  Safety/Judgment  manage safety w/ mod assistance   Therapy Plan: PT Intensity: Minimum of 1-2 x/day ,45 to 90 minutes PT Frequency: 5 out of 7 days PT Duration Estimated Length of Stay: 2-3 weeks OT Intensity: Minimum of 1-2 x/day, 45 to 90 minutes OT Frequency: 5 out of 7 days OT Duration/Estimated Length of Stay: 2 weeks SLP Intensity: Minumum of 1-2 x/day, 30 to 90 minutes SLP Frequency: 3 to 5 out of 7 days SLP Duration/Estimated Length of Stay: 2/17   Team Interventions: Nursing Interventions Patient/Family Education,  Bladder Management, Bowel Management, Disease Management/Prevention, Pain Management, Discharge Planning, Skin Care/Wound Management, Medication Management  PT interventions Ambulation/gait training, Community reintegration, DME/adaptive equipment instruction, Neuromuscular re-education, Psychosocial support, Stair training, UE/LE Strength taining/ROM, Wheelchair propulsion/positioning, Warden/ranger, Discharge planning, Pain management, Therapeutic Activities, UE/LE Coordination activities, Cognitive remediation/compensation, Disease management/prevention, Functional mobility training, Patient/family education, Therapeutic Exercise, Visual/perceptual remediation/compensation, Splinting/orthotics  OT Interventions Warden/ranger, Community reintegration, Disease mangement/prevention, Development worker, international aid stimulation, Neuromuscular re-education, Patient/family education, Self Care/advanced ADL retraining, Splinting/orthotics, Therapeutic Exercise, UE/LE Coordination activities, Wheelchair propulsion/positioning, Cognitive remediation/compensation, Discharge planning, DME/adaptive equipment instruction, Functional mobility training, Pain management, Psychosocial support, Skin care/wound managment, Therapeutic Activities, UE/LE Strength taining/ROM, Visual/perceptual remediation/compensation  SLP Interventions Cognitive remediation/compensation, Cueing hierarchy, Functional tasks, Therapeutic Activities, Internal/external aids, Patient/family education  TR Interventions    SW/CM Interventions Discharge Planning, Psychosocial Support   Barriers to Discharge MD  Medical stability, Home enviroment access/loayout, Incontinence, Wound care, Lack of/limited family support, Insurance for SNF coverage, Weight, Medication compliance, and Behavior  Nursing Home environment access/layout, IV antibiotics, Incontinence, Wound Care One level ; 4 Stairs to enter; no rails  PT Inaccessible home  environment, Home environment access/layout, Decreased caregiver support, Incontinence 4 STE, incontinent of bladder  OT Incontinence, Other (comments) Cog/safety, skin integrity, high falls risk  SLP      SW Decreased caregiver support, Lack of/limited family support, Community education officer for SNF coverage     Team Discharge Planning: Destination: PT-Home ,OT- Home , SLP-Home Projected Follow-up: PT-Home health PT, OT-  Home health OT, SLP-Home Health SLP, 24 hour supervision/assistance Projected Equipment Needs: PT-To be determined, OT- To be determined,  SLP-None recommended by SLP Equipment Details: PT- , OT-  Patient/family involved in discharge planning: PT- Patient,  OT-Patient, SLP-Patient  MD ELOS: 12-14 days Medical Rehab Prognosis:  Fair Assessment: The patient has been admitted for CIR therapies with the diagnosis of Debility from delirium s/p hospitalization for GIB. The team will be addressing functional mobility, strength, stamina, balance, safety, adaptive techniques and equipment, self-care, bowel and bladder mgt, patient and caregiver education. Goals have been set at Min A. Anticipated discharge destination is home.       See Team Conference Notes for weekly updates to the plan of care

## 2023-10-27 NOTE — Progress Notes (Signed)
Patient refuses 12:00PM meds this evening. Educated with no evidence of learning. Care ongoing.   Rito Ehrlich, LPN

## 2023-10-27 NOTE — Progress Notes (Signed)
Pt initially refused all medications and then at 1830 requested pain medication. Tylenol attempted to be given. Patient unable to swallow pills whole. This nurse then went back to the pyxis in order to pull another set of Tylenol 650mg . When crushed and placed in apple sauce, patient took small bite of apple sauce and stated "no more" as the nurse attempted to administer the medication. Nurse educated patient that their pain medication was in the apple sauce, but the patient continued to refuse. Care ongoing.    Rito Ehrlich, LPN

## 2023-10-27 NOTE — Progress Notes (Signed)
PROGRESS NOTE   Subjective/Complaints: No reported events overnight.  Patient did get as needed melatonin overnight. Hypertension 190s over 67 at 5 AM, came down to 160s over 62 at 6 AM.  Otherwise vitally stable. Bladder scans low PVRs.  Last bowel movement 1-28, large.  ROS: Endorses all ROS questions equally, including abdominal pain, chest pain, fever, chills, nausea, vomiting, diarrhea, constipation, and feeling "drunk"  Objective:   DG Abd 1 View Result Date: 10/25/2023 CLINICAL DATA:  Abdominal pain EXAM: ABDOMEN - 1 VIEW COMPARISON:  None Available. FINDINGS: Scattered large and small bowel gas is noted. No obstructive changes are seen. No free air is noted. Mild degenerative changes of lumbar spine are seen. IMPRESSION: No acute abnormality noted. Electronically Signed   By: Alcide Clever M.D.   On: 10/25/2023 21:13   Recent Labs    10/25/23 0557  WBC 3.8*  HGB 8.6*  HCT 27.7*  PLT 167   Recent Labs    10/25/23 0557 10/26/23 0504  NA 136 142  K 3.8 3.3*  CL 107 111  CO2 20* 20*  GLUCOSE 110* 100*  BUN 20 16  CREATININE 1.54* 1.31*  CALCIUM 7.8* 8.3*    Intake/Output Summary (Last 24 hours) at 10/27/2023 2956 Last data filed at 10/26/2023 1300 Gross per 24 hour  Intake 120 ml  Output --  Net 120 ml        Physical Exam: Vital Signs Blood pressure (!) 166/62, pulse 64, temperature 98.7 F (37.1 C), temperature source Oral, resp. rate 18, SpO2 95%.  Physical Exam Vitals and nursing note reviewed.   PE: Constitution: Appropriate appearance for age. No apparent distress. +Obese.  Sitting on toilet. Resp: No respiratory distress. No accessory muscle usage. on RA and CTAB Cardio: Well perfused appearance. No peripheral edema. Capillary refill brisk.  Abdomen: Nondistended but protuberant. +BS. Nontender in all 4 quadrants.   Psych: Flat. Minimally interactive, answers all questions and participate but  with far off looks.  Intermittent hallucinations and delusional content.  Skin: A lot of skin tears on UE"s and bruising- covered with dressings - d/c/I.  LUE wrist IV-  Hemosiderin straining and 1-2+ LE edema to mid calf B/L    Neuro: AAOx to self, place, time, and year with cues.  Can follow all simple commands. + Poor attention, perseveration, memory deficits Reflexes intact Sensory exam: revealed normal sensation in all dermatomal regions in BL UE and LE grossly Motor exam: All 4 extremities antigravity and against resistance Coordination: Fine motor coordination was grossly normal.   Cranial nerves II through XII intact. Pupils equal round reactive to light and accommodation, EOMI.  No apparent nystagmus.      Assessment/Plan: 1. Functional deficits which require 3+ hours per day of interdisciplinary therapy in a comprehensive inpatient rehab setting. Physiatrist is providing close team supervision and 24 hour management of active medical problems listed below. Physiatrist and rehab team continue to assess barriers to discharge/monitor patient progress toward functional and medical goals  Care Tool:  Bathing    Body parts bathed by patient: Right arm, Left arm, Chest, Abdomen, Face   Body parts bathed by helper: Front perineal area, Buttocks, Right upper  leg, Left upper leg, Right lower leg, Left lower leg     Bathing assist Assist Level: Maximal Assistance - Patient 24 - 49%     Upper Body Dressing/Undressing Upper body dressing   What is the patient wearing?: Pull over shirt    Upper body assist Assist Level: Minimal Assistance - Patient > 75%    Lower Body Dressing/Undressing Lower body dressing      What is the patient wearing?: Incontinence brief, Pants     Lower body assist Assist for lower body dressing: Maximal Assistance - Patient 25 - 49%     Toileting Toileting    Toileting assist Assist for toileting: Total Assistance - Patient < 25%      Transfers Chair/bed transfer  Transfers assist  Chair/bed transfer activity did not occur: Safety/medical concerns (unsafe to get up)  Chair/bed transfer assist level: 2 Helpers     Locomotion Ambulation   Ambulation assist   Ambulation activity did not occur: Safety/medical concerns          Walk 10 feet activity   Assist  Walk 10 feet activity did not occur: Safety/medical concerns        Walk 50 feet activity   Assist Walk 50 feet with 2 turns activity did not occur: Safety/medical concerns         Walk 150 feet activity   Assist Walk 150 feet activity did not occur: Safety/medical concerns         Walk 10 feet on uneven surface  activity   Assist Walk 10 feet on uneven surfaces activity did not occur: Safety/medical concerns         Wheelchair     Assist Is the patient using a wheelchair?: Yes Type of Wheelchair: Manual    Wheelchair assist level: Dependent - Patient 0%      Wheelchair 50 feet with 2 turns activity    Assist        Assist Level: Dependent - Patient 0%   Wheelchair 150 feet activity     Assist      Assist Level: Dependent - Patient 0%   Blood pressure (!) 166/62, pulse 64, temperature 98.7 F (37.1 C), temperature source Oral, resp. rate 18, SpO2 95%. Medical Problem List and Plan: 1. Functional deficits secondary to debility from GI bleed/delirium             -patient may  shower- cover skin tears             -ELOS/Goals: 12-14 days min A to supervision-2-17 discharge date             - stable to continue CIR   - 1-28: If participatory with therapies and able to follow commands, but remains very confused, with active hallucination.  2.  Antithrombotics: -DVT/anticoagulation:  Pharmaceutical: Other (comment)-_Pradaxa resumed on 01/21              -antiplatelet therapy: N/A 3. Pain Management: Tylenol prn.  4. Mood/Behavior/Sleep/delirium: LCSW to follow for evaluation and support.               -antipsychotic agents: N/A--did not tolerate Seroquel    --Monitor sleep wake cycle. -Reminded nursing to add sleep log 1-28:  Did poorly with scheduled nightly Seroquel on inpatient due to daytime somnolence.  Add mirtazapine 15 mg nightly, keep melatonin 5 mg as needed. 1-29: ***  5. Neuropsych/cognition: This patient is not capable of making decisions on his own behalf.   - 1-28: Adding telemonitoring,  4 side rails for safety.  Add Zyprexa 5 mg sublingual versus IM as needed for agitation, given patient is spitting most medication.  Restraints as above.   6. Skin/Wound Care: Routine pressure relief measures.    -Monitor of skin tears, bruises.  7. Fluids/Electrolytes/Nutrition: Monitor I/O check CMET in am   - 1/27: See below; 500 cc IVF today, encourage PO, recheck BMP in AM. Add calcium supplement  -1-28: Labs improved, encourage p.o.'s.  8. GIB/Duodenal AVM s/p APC:  To continue PPI BID X 4 weeks-->10/26/23   - HgB stable; see #18  9. Right hemothorax s/p CT/fibrinolysis: Has been weaned off oxygen             --follow up with Dr. Irena Cords in 2 months w/repeat CT chest  10. Delirium/intermittent movement d/o: Delirium precautions.  - see #4  11. PAF/SVT/HTN: HR improved with titrated of coreg to 12.5 mg bid.  -Will have to monitor very closely with regular refusal of medications; so far pulse remains stable.  -1/29: Continues intermittently refusing medications, heart rate well-controlled, blood pressure rising.  Resume home olmesartan at low-dose 5 mg; substituted for formulary Avapro 37.5 mg daily.  Add hydralazine 10 mg every 8 hours as needed for systolic blood pressure greater than 180, diastolic greater than 110    10/27/2023    6:18 AM 10/27/2023    5:31 AM 10/26/2023    7:52 PM  Vitals with BMI  Systolic 166 190 161  Diastolic 62 67 100  Pulse 64 57 78     12.  Hypokalemia/Hypomagnesemia/hypocalcemia: K+ 3.1 this AM- was given KCL- will recheck in AM   - 1/27: K  stable/improved; will start Oscal 500 mg BID for Ca 7.8  1-28: K3.3, start 20 mill equivalents potassium twice daily.  Retest later this week.  13. HCAP: Treated with 7 day course of Zosyn   - No respiratory distress 14. Gross hematuria: Has resolved--->to follow up with Dr. Annabell Howells on outpatient basis.  15. T2DM: Hgb A1c- 5.5. Diet controlled at this time             --has been off actos.  No results for input(s): "GLUCAP" in the last 72 hours.    16. Acute on chronic renal failure -CKD stage IIIb:  BUN/SCr improving.  --Followed by Dr. Zetta Bills w/plan for renal bx prior to admission.              --encourage fluid intake- Cr 1.33 this AM up from 1.0- will recheck in Ama nd push fluids.   1/27: Continues uptrending to 1.5 today; Is only 240 yesterday; Push PO fluids and give 500 cc bolus today, check in AM  1-28: Creatinine improved to 1.3.  P.o. intakes remain poor, will continue daily monitoring and bolus as needed  17. Hx Pancytopenia:  Bone bx w/ ICUS (brewing myelodysplasia) per Dr. Leonides Schanz.    - 1/27: WBC down slightly; monitor  18. Abdominal pain/nausea   - KUB without concerning findings 1/27   - Zofran PRN added   - on protonix 40 mg BID   - Having regular Bms 1-28: Multiple incontinent bowel movements overnight.  Tylenol scheduled for generalized pain. 29: No further bowel movements overnight.  19.  Bladder incontinence.  PVRs next 2 days.  Labs not concerning for infection.  On Flomax 0.4 mg daily  -1-29: PVRs low.  mixed continence.  Continue timed toileting.   LOS: 3 days A FACE TO FACE EVALUATION WAS PERFORMED  Bobetta Lime  Teandra Harlan 10/27/2023, 8:12 AM

## 2023-10-27 NOTE — Progress Notes (Signed)
Occupational Therapy Session Note  Patient Details  Name: Joshua [name] MRN: [id number] Date of Birth: 04-01-1951  {CHL IP REHAB OT TIME CALCULATIONS:[id number]}   Short Term Goals: Week 1:  OT Short Term Goal 1 (Week 1): Pt will stand sink side level for 2 min for grooming with min cues OT Short Term Goal 2 (Week 1): Pt will complete LB self care with min A with cues and AE as needed OT Short Term Goal 3 (Week 1): Pt will transfer to and from TTB and toilet with grab bars with min A and min cues  Skilled Therapeutic Interventions/Progress Updates:    Patient agreeable to participate in OT session. Reports *** pain level.   Patient participated in skilled OT session focusing on ***. Therapist facilitated/assessed/developed/educated/integrated/elicited *** in order to improve/facilitate/promote    Therapy Documentation Precautions:  Precautions Precautions: Fall Precaution Comments: continous spitting Restrictions Weight Bearing Restrictions Per Provider Order: No  Therapy/Group: Individual Therapy  Limmie Patricia, OTR/L,CBIS  Supplemental OT - [facility] Secure Chat Preferred   10/27/2023, 10:00 PM

## 2023-10-27 NOTE — Progress Notes (Signed)
Speech Language Pathology Daily Session Note  Patient Details  Name: CLYDE ZARRELLA MRN: [id number] Date of Birth: 22-Sep-1951  Today's Date: 10/27/2023 SLP Individual Time: 4098-1191 SLP Individual Time Calculation (min): 54 min  Short Term Goals: Week 1: SLP Short Term Goal 1 (Week 1): Patient will orient to date and location using external aids in 4/5 opportunities given mod assist. SLP Short Term Goal 2 (Week 1): Patient will solve basic environmental problems with 75% accuracy given mod assist. SLP Short Term Goal 3 (Week 1): Patient will demonstrate awareness of deficits by stating 2 cognitive and 2 physical deficits given max assist. SLP Short Term Goal 4 (Week 1): Patient will utilize external aids to recall daily events with 75% accuracy given mod assist.  Skilled Therapeutic Interventions: SLP conducted skilled therapy session targeting cognitive retraining goals. Patient fidgeting throughout session with remote and perseverating on hand movements, difficult to sustain attention to tasks at beginning of session. SLP facilitated money management tasks. During money addition worksheet, patient answered all questions accurately with supervision, however during functional money management tasks with fake bills, patient required max assist to switch cognitive sets and for basic problem solving. Patient requires max assist to recall events of the day and to orient to date and location. Patient demonstrates limited awareness of physical/cognitive deficits and requires max assist to recognize. Patient was left in chair with call bell in reach and chair alarm set. SLP will continue to target goals per plan of care.      Pain Pain Assessment Pain Scale: 0-10 Pain Score: 0-No pain  Therapy/Group: Individual Therapy  Jeannie Done, M.A., CCC-SLP  Yetta Barre 10/27/2023, 3:50 PM

## 2023-10-28 ENCOUNTER — Inpatient Hospital Stay (HOSPITAL_COMMUNITY): Payer: Medicare HMO

## 2023-10-28 LAB — CBC
HCT: 29.7 % — ABNORMAL LOW (ref 39.0–52.0)
Hemoglobin: 9.4 g/dL — ABNORMAL LOW (ref 13.0–17.0)
MCH: 30 pg (ref 26.0–34.0)
MCHC: 31.6 g/dL (ref 30.0–36.0)
MCV: 94.9 fL (ref 80.0–100.0)
Platelets: 177 10*3/uL (ref 150–400)
RBC: 3.13 MIL/uL — ABNORMAL LOW (ref 4.22–5.81)
RDW: 18.6 % — ABNORMAL HIGH (ref 11.5–15.5)
WBC: 4.3 10*3/uL (ref 4.0–10.5)
nRBC: 0 % (ref 0.0–0.2)

## 2023-10-28 LAB — BASIC METABOLIC PANEL
Anion gap: 11 (ref 5–15)
BUN: 22 mg/dL (ref 8–23)
CO2: 22 mmol/L (ref 22–32)
Calcium: 8 mg/dL — ABNORMAL LOW (ref 8.9–10.3)
Chloride: 108 mmol/L (ref 98–111)
Creatinine, Ser: 1.57 mg/dL — ABNORMAL HIGH (ref 0.61–1.24)
GFR, Estimated: 46 mL/min — ABNORMAL LOW (ref >60)
Glucose, Bld: 95 mg/dL (ref 70–99)
Potassium: 3.3 mmol/L — ABNORMAL LOW (ref 3.5–5.1)
Sodium: 141 mmol/L (ref 135–145)

## 2023-10-28 LAB — HEPATIC FUNCTION PANEL
ALT: 13 U/L (ref 0–44)
AST: 25 U/L (ref 15–41)
Albumin: 2.6 g/dL — ABNORMAL LOW (ref 3.5–5.0)
Alkaline Phosphatase: 59 U/L (ref 38–126)
Bilirubin, Direct: 0.2 mg/dL (ref 0.0–0.2)
Indirect Bilirubin: 0.6 mg/dL (ref 0.3–0.9)
Total Bilirubin: 0.8 mg/dL (ref 0.0–1.2)
Total Protein: 5.9 g/dL — ABNORMAL LOW (ref 6.5–8.1)

## 2023-10-28 LAB — CBC WITH DIFFERENTIAL/PLATELET
Abs Immature Granulocytes: 0.09 10*3/uL — ABNORMAL HIGH (ref 0.00–0.07)
Basophils Absolute: 0.1 10*3/uL (ref 0.0–0.1)
Basophils Relative: 1 %
Eosinophils Absolute: 0.2 10*3/uL (ref 0.0–0.5)
Eosinophils Relative: 3 %
HCT: 28.9 % — ABNORMAL LOW (ref 39.0–52.0)
Hemoglobin: 9 g/dL — ABNORMAL LOW (ref 13.0–17.0)
Immature Granulocytes: 2 %
Lymphocytes Relative: 13 %
Lymphs Abs: 0.8 10*3/uL (ref 0.7–4.0)
MCH: 30.4 pg (ref 26.0–34.0)
MCHC: 31.1 g/dL (ref 30.0–36.0)
MCV: 97.6 fL (ref 80.0–100.0)
Monocytes Absolute: 0.5 10*3/uL (ref 0.1–1.0)
Monocytes Relative: 7 %
Neutro Abs: 4.5 10*3/uL (ref 1.7–7.7)
Neutrophils Relative %: 74 %
Platelets: 208 10*3/uL (ref 150–400)
RBC: 2.96 MIL/uL — ABNORMAL LOW (ref 4.22–5.81)
RDW: 18.7 % — ABNORMAL HIGH (ref 11.5–15.5)
WBC: 6.1 10*3/uL (ref 4.0–10.5)
nRBC: 0 % (ref 0.0–0.2)

## 2023-10-28 LAB — LACTIC ACID, PLASMA: Lactic Acid, Venous: 1.9 mmol/L (ref 0.5–1.9)

## 2023-10-28 LAB — MAGNESIUM: Magnesium: 1.2 mg/dL — ABNORMAL LOW (ref 1.7–2.4)

## 2023-10-28 LAB — VITAMIN B12: Vitamin B-12: 604 pg/mL (ref 180–914)

## 2023-10-28 LAB — PHOSPHORUS: Phosphorus: 3.1 mg/dL (ref 2.5–4.6)

## 2023-10-28 LAB — BRAIN NATRIURETIC PEPTIDE: B Natriuretic Peptide: 213 pg/mL — ABNORMAL HIGH (ref 0.0–100.0)

## 2023-10-28 LAB — FOLATE: Folate: 21.4 ng/mL (ref >5.9)

## 2023-10-28 MED ORDER — LACTATED RINGERS IV SOLN: INTRAVENOUS | Status: AC

## 2023-10-28 MED ORDER — POTASSIUM CHLORIDE 20 MEQ PO PACK
20.0000 meq | PACK | Freq: Two times a day (BID) | ORAL | Status: DC
  Administered 2023-10-28 – 2023-10-29 (×3): 20 meq via ORAL
  Filled 2023-10-28 (×3): qty 1

## 2023-10-28 MED ORDER — MAGNESIUM SULFATE 4 GM/100ML IV SOLN
4.0000 g | Freq: Once | INTRAVENOUS | Status: AC
  Administered 2023-10-28: 4 g via INTRAVENOUS
  Filled 2023-10-28: qty 100

## 2023-10-28 MED ORDER — TRAZODONE HCL 50 MG PO TABS
50.0000 mg | ORAL_TABLET | Freq: Every day | ORAL | Status: DC
  Administered 2023-10-28: 50 mg via ORAL
  Filled 2023-10-28: qty 1

## 2023-10-28 NOTE — Progress Notes (Signed)
Physical Therapy Session Note  Patient Details  Name: Joshua [name] MRN: [id number] Date of Birth: [birth year]  Today's Date: 10/28/2023 PT Individual Time: 1310-1345 PT Individual Time Calculation (min): 35 min   Short Term Goals: Week 1:  PT Short Term Goal 1 (Week 1): Pt will complete bed mobility with mod assist and min verbal cues PT Short Term Goal 2 (Week 1): Pt will complete transfers with mod assist and LRAD PT Short Term Goal 3 (Week 1): Pt will ambulate 51' with mod assist  Skilled Therapeutic Interventions/Progress Updates:    Handoff from NT with pt completing toileting. Pt reports he is feeling a little better than this morning, agreeable to session. Transported down to therapy gym with total assist via TIS. Sit > stand with RW with mod assist due to strong posterior lean and cues for hand placement and technique. Focused on regaining balance in standing to prepare for gait trial but then pt reports having a BM and requesting to use the bathroom. Returned to room and transferred via stedy with min to mod assist and pt had not had a BM but feels like he needed to. Spend several times sit <> stand to attempt various positions of holding urinal in standing, then sitting for BM, then standing again, repeatedly - difficulty with this PT following the train of thought - pt did not have BM or urinate despite all thse attempts. Pt demonstrating dynamic standing balance in steady with minimal or no UE at times with min assist. Ran out of time by end of this toileting, so did not return to gym. Set up in TIS and all needs in reach.  Therapy Documentation Precautions:  Precautions Precautions: Fall Precaution Comments: continous spitting Restrictions Weight Bearing Restrictions Per Provider Order: No    Pain:  Reports abdominal pain still an issue. Pt had just completed bathroom tasks with NT.     Therapy/Group: Individual Therapy  [name] Darrol Poke, PT, DPT,  CBIS  10/28/2023, 2:16 PM

## 2023-10-28 NOTE — Progress Notes (Signed)
Occupational Therapy Session Note  Patient Details  Name: JLON BETKER MRN: [id number] Date of Birth: 02/12/1951  Today's Date: 10/29/2023 OT Individual Time: 1111-1205 OT Individual Time Calculation (min): 54 min    Short Term Goals: Week 1:  OT Short Term Goal 1 (Week 1): Pt will stand sink side level for 2 min for grooming with min cues OT Short Term Goal 2 (Week 1): Pt will complete LB self care with min A with cues and AE as needed OT Short Term Goal 3 (Week 1): Pt will transfer to and from TTB and toilet with grab bars with min A and min cues  Skilled Therapeutic Interventions/Progress Updates:    Patient agreeable to participate in OT session. Reports no pain during session.   Patient participated in skilled OT session focusing on ADL re-training, manual therapy, WC positioning techniques.  Pt verbalized that he could use the bathroom when asked. Completed sit to stand in bathroom using grab bars. Pt provided with VC for sequencing and direction although did not follow and attempted to sit back down in WC. Completed sit to stand transition again, this time provided multi-model cueing including requesting that pt open his eyes and look at toilet. Pt demonstrate slow processing and poor initiation of task although was able to transition from standing to sitting on toilet. Noticed blood on the floor and large skin tear on lateral aspect of left calf. Nursing was called and assisted with dressing wound. Pt unable to urinate or have a BM. Total assist provided to donn new brief, pants, and socks due to decreased alertness.   -Removed head rest from TIS to decrease amount of cervical flexion and rounded shoulders caused with rest on chair. Pt severely kyphotic. - Myofascial release and stretching completed to bilateral upper trapezius and cervical region to decrease fascial restriction and increase joint mobility in a pain free zone.  - Passive and active cervical rotation (right/left),  cervical extension, scapular retraction, 5X, 1 set, seated. Verbal and tactile cues provided for proper form and technique.  - Pt completed BUE strengthening utilizing 1# dowel rod seated. Completed chest press, shoulder flexion, 1 set, 10X.   Therapy Documentation Precautions:  Precautions Precautions: Fall Precaution Comments: continous spitting Restrictions Weight Bearing Restrictions Per Provider Order: No   Therapy/Group: Individual Therapy  Limmie Patricia, OTR/L,CBIS  Supplemental OT - [facility] Secure Chat Preferred   10/29/2023, 8:01 AM

## 2023-10-28 NOTE — Progress Notes (Signed)
Speech Language Pathology Daily Session Note  Patient Details  Name: Joshua [name] MRN: [id number] Date of Birth: 10/19/1950  Today's Date: 10/28/2023 SLP Individual Time: 1006-1100 SLP Individual Time Calculation (min): 54 min  Short Term Goals: Week 1: SLP Short Term Goal 1 (Week 1): Patient will orient to date and location using external aids in 4/5 opportunities given mod assist. SLP Short Term Goal 2 (Week 1): Patient will solve basic environmental problems with 75% accuracy given mod assist. SLP Short Term Goal 3 (Week 1): Patient will demonstrate awareness of deficits by stating 2 cognitive and 2 physical deficits given max assist. SLP Short Term Goal 4 (Week 1): Patient will utilize external aids to recall daily events with 75% accuracy given mod assist.  Skilled Therapeutic Interventions: SLP conducted skilled therapy session targeting cognitive retraining goals. Patient agreeable to speech therapy session but notably exhibited flat affect and drowsiness at beginning of session. With prompting, patient expressed that he was cold. After addressing this issue, patient much more alert and participatory. SLP facilitated various functional cognitive tasks targeting orientation, memory of daily events, attention, and problem solving. Patient exhibiting improvements, requiring only supervision for initiation/processing this session across all tasks. Patient benefited from min cues to utilize external aids to orient to date and location and requires min assist to recall events from previous therapy sessions. During card sorting task, required supervision to min assist to correctly sort cards by suit. During session, patient received phone call from family, whom he asked not to visit. Upon prompting, patient stated "there's no use coming to visit someone who's already dead." Provided counseling, as patient states he has not seen any physical or cognitive improvement since being here despite visible  gains. SLP then facilitated attention/scanning task with patient completing with 90% accuracy given supervision. Patient was left in chair with call bell in reach and chair alarm set. SLP will continue to target goals per plan of care.       Pain Pain Assessment Pain Scale: 0-10 Pain Score: 10-Worst pain ever Pain Type: Acute pain Pain Location: Abdomen Pain Orientation: Lower Pain Descriptors / Indicators: Aching Pain Frequency: Intermittent Pain Onset: Gradual Pain Intervention(s): Medication (See eMAR)  Therapy/Group: Individual Therapy  Jeannie Done, M.A., CCC-SLP  Yetta Barre 10/28/2023, 10:48 AM

## 2023-10-28 NOTE — Progress Notes (Signed)
PROGRESS NOTE   Subjective/Complaints: Vitals remain hypertensive 150s to 160s, otherwise stable.  Continues to intermittently refuse medications, although today did take most of his a.m. meds today.  Continues to complain of diffuse pain, especially lower abdominal, along with constantly feeling "drunk" and asking provider for alcohol.  Also, endorsing he is staying up all night because his wife is next to his bed and will not let him sleep.  Hallucinating other people as well.  Later, saw patient's wife and daughter at bedside; they had multiple questions.  Endorsing that he has remained encephalopathic and essentially unchanged since his EGD on 1-2.  Did have 1 day of lucidity, but was short-lived.  Daughter asking multiple times if encephalopathy is medication reaction to Protonix, or other medications.  All questions answered at bedside.  Will get chest x-ray today given recent pleural effusion, ongoing encephalopathy of unknown etiology.   ROS: Endorses all ROS questions equally  Objective:   No results found.  Recent Labs    10/28/23 0541 10/28/23 1420  WBC 4.3 6.1  HGB 9.4* 9.0*  HCT 29.7* 28.9*  PLT 177 208   Recent Labs    10/26/23 0504 10/28/23 0541  NA 142 141  K 3.3* 3.3*  CL 111 108  CO2 20* 22  GLUCOSE 100* 95  BUN 16 22  CREATININE 1.31* 1.57*  CALCIUM 8.3* 8.0*    Intake/Output Summary (Last 24 hours) at 10/28/2023 1701 Last data filed at 10/28/2023 0815 Gross per 24 hour  Intake 480 ml  Output --  Net 480 ml        Physical Exam: Vital Signs Blood pressure (!) 150/70, pulse 80, temperature 98.7 F (37.1 C), temperature source Oral, resp. rate 16, SpO2 100%.  Physical Exam Vitals and nursing note reviewed.   PE: Constitution: Appropriate appearance for age. No apparent distress. +Obese.  Sitting up in wheelchair, picking at wristband  resp: No respiratory distress. No accessory muscle  usage. on RA and CTAB Cardio: Well perfused appearance.  1+ nonpitting peripheral edema. Capillary refill brisk.  Abdomen: Nondistended but protuberant. +BS. Nontender in all 4 quadrants.   Psych: Flat. Minimally interactive, answers all questions and participate but with far off looks.  Intermittent hallucinations and delusional content.--unchanged 1/30  Skin: A lot of skin tears on UE"s and bruising- covered with dressings -mostly healing, although he is intermittently picking at scabs on hands  Hemosiderin straining and tr+ LE edema to mid calf B/L    Neuro: AAOx to self, place, time, and year.  Can follow all simple commands. +  preoccupation/poor attention + Waxing and waning memory deficit Reflexes intact Sensory exam: revealed normal sensation in all dermatomal regions in BL UE and LE grossly Motor exam: All 4 extremities antigravity and against resistance Coordination: Fine motor coordination was grossly normal.   Cranial nerves II through XII intact. Pupils equal round reactive to light and accommodation, EOMI.  No apparent nystagmus.  Exam unchanged 1-30    Assessment/Plan: 1. Functional deficits which require 3+ hours per day of interdisciplinary therapy in a comprehensive inpatient rehab setting. Physiatrist is providing close team supervision and 24 hour management of active medical problems listed below.  Physiatrist and rehab team continue to assess barriers to discharge/monitor patient progress toward functional and medical goals  Care Tool:  Bathing    Body parts bathed by patient: Right arm, Left arm, Chest, Abdomen, Face   Body parts bathed by helper: Front perineal area, Buttocks, Right upper leg, Left upper leg, Right lower leg, Left lower leg     Bathing assist Assist Level: Maximal Assistance - Patient 24 - 49%     Upper Body Dressing/Undressing Upper body dressing   What is the patient wearing?: Pull over shirt    Upper body assist Assist Level:  Minimal Assistance - Patient > 75%    Lower Body Dressing/Undressing Lower body dressing      What is the patient wearing?: Incontinence brief, Pants     Lower body assist Assist for lower body dressing: Maximal Assistance - Patient 25 - 49%     Toileting Toileting    Toileting assist Assist for toileting: Total Assistance - Patient < 25%     Transfers Chair/bed transfer  Transfers assist  Chair/bed transfer activity did not occur: Safety/medical concerns (unsafe to get up)  Chair/bed transfer assist level: 2 Helpers     Locomotion Ambulation   Ambulation assist   Ambulation activity did not occur: Safety/medical concerns          Walk 10 feet activity   Assist  Walk 10 feet activity did not occur: Safety/medical concerns        Walk 50 feet activity   Assist Walk 50 feet with 2 turns activity did not occur: Safety/medical concerns         Walk 150 feet activity   Assist Walk 150 feet activity did not occur: Safety/medical concerns         Walk 10 feet on uneven surface  activity   Assist Walk 10 feet on uneven surfaces activity did not occur: Safety/medical concerns         Wheelchair     Assist Is the patient using a wheelchair?: Yes Type of Wheelchair: Manual    Wheelchair assist level: Dependent - Patient 0%      Wheelchair 50 feet with 2 turns activity    Assist        Assist Level: Dependent - Patient 0%   Wheelchair 150 feet activity     Assist      Assist Level: Dependent - Patient 0%   Blood pressure (!) 150/70, pulse 80, temperature 98.7 F (37.1 C), temperature source Oral, resp. rate 16, SpO2 100%. Medical Problem List and Plan: 1. Functional deficits secondary to debility from GI bleed/delirium             -patient may  shower- cover skin tears             -ELOS/Goals: 12-14 days min A to supervision-2-17 discharge date             - stable to continue CIR   - 1-28: If participatory  with therapies and able to follow commands, but remains very confused, with active hallucination.  2.  Antithrombotics: -DVT/anticoagulation:  Pharmaceutical: Other (comment)-_Pradaxa resumed on 01/21              -antiplatelet therapy: N/A 3. Pain Management: Tylenol prn.  4. Mood/Behavior/Sleep/delirium: LCSW to follow for evaluation and support.              -antipsychotic agents: N/A--did not tolerate Seroquel    --Monitor sleep wake cycle. -Reminded nursing to add sleep  log 1-28:  Did poorly with scheduled nightly Seroquel on inpatient due to daytime somnolence.  Add mirtazapine 15 mg nightly, keep melatonin 5 mg as needed. 1-29: Reminded nursing to apply sleep log.  Added as needed trazodone 50 mg. 1-30: Slept intermittently 1 to 2 hours for a total of 4 hours overnight.  Family wishes to avoid sedating medications; understand that he needs sleep.  Will switch to scheduled trazodone 50 mg nightly.  Encourage sleep, wake cycle.  -Family notes that at baseline, patient does not sleep well at night, naps often during the day  5. Neuropsych/cognition: This patient is not capable of making decisions on his own behalf.   - 1-28: Adding telemonitoring, 4 side rails for safety.  Add Zyprexa 5 mg sublingual versus IM as needed for agitation, given patient is spitting most medication.  Restraints as above. --Have not required as needed Zyprexa  6. Skin/Wound Care: Routine pressure relief measures.    -Monitor of skin tears, bruises.   - Cover areas of skin tears with Mepilex dressings to avoid compulsive picking  7. Fluids/Electrolytes/Nutrition: Monitor I/O check CMET in am   - 1/27: See below; 500 cc IVF today, encourage PO, recheck BMP in AM. Add calcium supplement  -1-28: Labs improved, encourage p.o.'s.  1-30: AKI again today, with poor p.o. intakes.  Initiate IV fluids, repeat BMP in AM.  Mag low, give 4 g IV repletion, repeat in AM.  8. GIB/Duodenal AVM s/p APC:  To continue PPI BID X 4  weeks-->10/26/23   - HgB stable; see #18  9. Right hemothorax s/p CT/fibrinolysis: Has been weaned off oxygen             --follow up with Dr. Irena Cords in 2 months w/repeat CT chest  10. Delirium/intermittent movement d/o: Delirium precautions.  - see #4 1-30: Lab work and chest x-ray today as above for unremitting symptoms.  Encouraged family to promote compliance with medications and sleep/wake cycle.  May get MRI if no demonstrable improvement in next 1 to 2 days.  Did discontinue Protonix per family request, and Atarax as needed due to sedating effects.  11. PAF/SVT/HTN: HR improved with titrated of coreg to 12.5 mg bid.  -Will have to monitor very closely with regular refusal of medications; so far pulse remains stable.  -1/29: Continues intermittently refusing medications, heart rate well-controlled, blood pressure rising.  Resume home olmesartan at low-dose 5 mg; substituted for formulary Avapro 37.5 mg daily.  Add hydralazine 10 mg every 8 hours as needed for systolic blood pressure greater than 180, diastolic greater than 110  1-30: Blood pressure remains elevated, but he only just stated taking his medications today.  Family to encourage compliance, BNP today downtrending from prior so unlikely heart failure exacerbation.     10/28/2023    2:00 PM 10/28/2023    6:27 AM 10/28/2023    6:20 AM  Vitals with BMI  Systolic 150 166 161  Diastolic 70 88 81  Pulse 80 99 88     12.  Hypokalemia/Hypomagnesemia/hypocalcemia: K+ 3.1 this AM- was given KCL- will recheck in AM   - 1/27: K stable/improved; will start Oscal 500 mg BID for Ca 7.8  1-28: K3.3, start 20 mill equivalents potassium twice daily.  Retest later this week.  1-30: Potassium unchanged, resume twice daily repletion.  4 g IV mag as above.  13. HCAP: Treated with 7 day course of Zosyn   - No respiratory distress  -1-30: Chest x-ray today  14.  Gross hematuria: Has resolved--->to follow up with Dr. Annabell Howells on outpatient basis.    -1-29: High proteinuria and some blood in urinalysis; negative for infection.  Consistent with prior urinalysis.  15. T2DM: Hgb A1c- 5.5. Diet controlled at this time             --has been off actos.  No results for input(s): "GLUCAP" in the last 72 hours.    16. Acute on chronic renal failure -CKD stage IIIb:  BUN/SCr improving.  --Followed by Dr. Zetta Bills w/plan for renal bx prior to admission.              --encourage fluid intake- Cr 1.33 this AM up from 1.0- will recheck in Ama nd push fluids.   1/27: Continues uptrending to 1.5 today; Is only 240 yesterday; Push PO fluids and give 500 cc bolus today, check in AM  1-28: Creatinine improved to 1.3.  P.o. intakes remain poor, will continue daily monitoring and bolus as needed  1-30: Resume IV fluids today for AKI.  BMP in AM.  17. Hx Pancytopenia:  Bone bx w/ ICUS (brewing myelodysplasia) per Dr. Leonides Schanz.    - 1/27: WBC down slightly; monitor--stable  18. Abdominal pain/nausea   - KUB without concerning findings 1/27   - Zofran PRN added   - on protonix 40 mg BID   - Having regular Bms 1-28: Multiple incontinent bowel movements overnight.  Tylenol scheduled for generalized pain. 1/29: No further bowel movements overnight. 1-30: Ongoing abdominal pain.  Labs today with normal LFTs, negative lactic acid, WBCs unchanged.  No fevers, chills, or other signs of infection.  Tolerating p.o. intakes.  -in AM if AKI is improved, will consider CT abdomen and pelvis for further workup given unrevealing nature of persistent encephalopathy.  19.  Bladder incontinence/dysuria.  PVRs next 2 days.  Labs not concerning for infection.  On Flomax 0.4 mg daily  -1-29: PVRs low.  mixed continence.  Continue timed toileting.  Urinalysis due to endorsing dysuria.--Negative   LOS: 4 days A FACE TO FACE EVALUATION WAS PERFORMED  Angelina Sheriff 10/28/2023, 5:01 PM

## 2023-10-28 NOTE — Progress Notes (Signed)
Patient ID: Joshua [name], male   DOB: 1951/05/26, 73 y.o.   MRN: [id number]  SW returned phone call to pt wife [name] who was reporting concerns about her husband's confusion. She reports when she spoke with him today he was "not himself," and states he is saying he drinks alcohol and he never has-- he has worked outdoors doing Administrator, sports. She said she left a message for the doctor, as she missed her phone call. She would like to know what is the cause and what is the plan to resolve this. SW informed will share with attending.   *SW updated attending on pt wife concern.   Cecile Sheerer, MSW, LCSW Office: [phone] Cell: [phone] Fax: [phone]

## 2023-10-28 NOTE — Progress Notes (Signed)
Physical Therapy Session Note  Patient Details  Name: Joshua [name] MRN: [id number] Date of Birth: August 13, 1951  Today's Date: 10/28/2023 PT Individual Time: 0825-0920 PT Individual Time Calculation (min): 55 min   Short Term Goals: Week 1:  PT Short Term Goal 1 (Week 1): Pt will complete bed mobility with mod assist and min verbal cues PT Short Term Goal 2 (Week 1): Pt will complete transfers with mod assist and LRAD PT Short Term Goal 3 (Week 1): Pt will ambulate 42' with mod assist  Skilled Therapeutic Interventions/Progress Updates:    Pt presents in TIS complaining of abdominal discomfort and being "drunk". Extra time for all processing. Took pt to bathroom via TIS. Sit > stand with RW with min assist x 3 reps but LOB posterior with pt getting frustrated that he told me already he was drunk. [name] for transfer for safety with min to mod assist (from low surface) with mod to max cues for sequencing and han dplacement for multiple sit > stands for clothing management and changing of brief (had soiled incontinent of urine in brief and no BM). Transferring back to TIS, pt appears a little pale. Checked BP (see below) and BP is running lower than usual. Reclined TIS x 3-4 min and rechecked at 110/62 mmHg. Pt continues to state "I told you I was drunk". Once appears with better color and less report of dizziness, Set up at sink for oral hygiene per request with extra time to complete task and pt maintaining eyes closed most of the time. Pt perseverative with medical bracelets on L wrist throughout session. Left up in w/c semi reclined and all needs in reach. LPN made aware of BP changes as well as MD.  Therapy Documentation Precautions:  Precautions Precautions: Fall Precaution Comments: continous spitting Restrictions Weight Bearing Restrictions Per Provider Order: No  Vital Signs: 98/98mmHg;HR = 68 bpm after standing 88/46 mmHg; HR = 68 bpm after standing 110/62 mmHg; HR = 59 bpm  reclined in TIS  Pain: Pain Assessment Pain Scale: 0-10 Pain Score: 10-Worst pain ever Pain Type: Acute pain Pain Location: Abdomen Pain Orientation: Lower Pain Descriptors / Indicators: Aching Pain Frequency: Intermittent Pain Onset: Gradual Pain Intervention(s): Medication (See eMAR)      Therapy/Group: Individual Therapy  [name] Darrol Poke, PT, DPT, CBIS  10/28/2023, 9:30 AM

## 2023-10-28 NOTE — Progress Notes (Signed)
Patient is alert not showing any signs of discomfort or distress. Patient educated on all medications. Patient agreed to take all medications at this time. Patient took all scheduled medications without difficultly.

## 2023-10-29 LAB — BASIC METABOLIC PANEL
Anion gap: 11 (ref 5–15)
BUN: 25 mg/dL — ABNORMAL HIGH (ref 8–23)
CO2: 20 mmol/L — ABNORMAL LOW (ref 22–32)
Calcium: 7.9 mg/dL — ABNORMAL LOW (ref 8.9–10.3)
Chloride: 108 mmol/L (ref 98–111)
Creatinine, Ser: 1.84 mg/dL — ABNORMAL HIGH (ref 0.61–1.24)
GFR, Estimated: 38 mL/min — ABNORMAL LOW (ref >60)
Glucose, Bld: 94 mg/dL (ref 70–99)
Potassium: 3.4 mmol/L — ABNORMAL LOW (ref 3.5–5.1)
Sodium: 139 mmol/L (ref 135–145)

## 2023-10-29 LAB — MAGNESIUM: Magnesium: 2.1 mg/dL (ref 1.7–2.4)

## 2023-10-29 MED ORDER — TRAZODONE HCL 50 MG PO TABS
100.0000 mg | ORAL_TABLET | Freq: Every day | ORAL | Status: DC
Start: 2023-10-29 — End: 2023-11-02
  Administered 2023-10-29 – 2023-11-01 (×4): 100 mg via ORAL
  Filled 2023-10-29 (×4): qty 2

## 2023-10-29 MED ORDER — FAMOTIDINE 20 MG PO TABS
20.0000 mg | ORAL_TABLET | Freq: Two times a day (BID) | ORAL | Status: DC
  Administered 2023-10-29 – 2023-11-08 (×21): 20 mg via ORAL
  Filled 2023-10-29 (×21): qty 1

## 2023-10-29 MED ORDER — LACTATED RINGERS IV SOLN: INTRAVENOUS | Status: AC

## 2023-10-29 MED ORDER — ALPRAZOLAM 0.5 MG PO TABS: 0.5000 mg | ORAL_TABLET | Freq: Once | ORAL | Status: DC | PRN

## 2023-10-29 MED ORDER — POTASSIUM CHLORIDE CRYS ER 20 MEQ PO TBCR
20.0000 meq | EXTENDED_RELEASE_TABLET | Freq: Two times a day (BID) | ORAL | Status: AC
  Administered 2023-10-29 – 2023-10-30 (×3): 20 meq via ORAL
  Filled 2023-10-29 (×3): qty 1

## 2023-10-29 NOTE — Progress Notes (Addendum)
PROGRESS NOTE   Subjective/Complaints:  No events overnight.  Sleep log not updated BP slightly better controlled today, 140s over 70s, patient has taken most of his medications for the last 3 administrations.  Patient complaining of ongoing lower abdominal pain, as a band across his lower belly.  Complaining of ongoing chest pain, generally associated with deep breaths and twisting..   Unable to obtain full ROS due to patient cognitive status.   + chest pain - with movement, generalized + abdominal pain - ongoing, unremitting + poor sleep - ongoing  Objective:   DG Chest 2 View Result Date: 10/28/2023 CLINICAL DATA:  142230 Pleural effusion 142230 EXAM: CHEST - 2 VIEW COMPARISON:  Chest x-ray 10/18/2023 FINDINGS: The heart and mediastinal contours are unchanged. Coronary artery stent. Low lung volumes. No focal consolidation. No pulmonary edema. No pleural effusion. No pneumothorax. No acute osseous abnormality. IMPRESSION: Low lung volumes with no active cardiopulmonary disease. Electronically Signed   By: Tish Frederickson M.D.   On: 10/28/2023 18:53    Recent Labs    10/28/23 0541 10/28/23 1420  WBC 4.3 6.1  HGB 9.4* 9.0*  HCT 29.7* 28.9*  PLT 177 208   Recent Labs    10/28/23 0541 10/29/23 0621  NA 141 139  K 3.3* 3.4*  CL 108 108  CO2 22 20*  GLUCOSE 95 94  BUN 22 25*  CREATININE 1.57* 1.84*  CALCIUM 8.0* 7.9*    Intake/Output Summary (Last 24 hours) at 10/29/2023 0947 Last data filed at 10/29/2023 0407 Gross per 24 hour  Intake 1052.87 ml  Output --  Net 1052.87 ml        Physical Exam: Vital Signs Blood pressure (!) 141/71, pulse 67, temperature 97.7 F (36.5 C), temperature source Oral, resp. rate 18, SpO2 94%.  Physical Exam Vitals and nursing note reviewed.   PE: Constitution: Appropriate appearance for age. No apparent distress. +Obese.  Sitting up in wheelchair.  Resp: No respiratory  distress. No accessory muscle usage. on RA and CTAB Cardio: Well perfused appearance.  1+ pitting R>L peripheral edema. Capillary refill brisk.  Abdomen: Nondistended but protuberant. +BS.  Minimally tender to palpation in lower quadrants. Psych: Flat. Minimally interactive, answers all questions and participate but with far off looks.  Intermittent hallucinations and delusional content.  Endorsing depression without SI/HI.  Skin: A lot of skin tears on UE"s and bruising- covered with dressings -mostly healing, although he is intermittently picking at scabs on hands  Hemosiderin straining b/l calfs  Neuro: AAOx to self, place, time, and year.  Can follow all simple commands. +  preoccupation/poor attention--much improved today, still altered + Waxing and waning memory deficit Reflexes intact Sensory exam: revealed normal sensation in all dermatomal regions in BL UE and LE grossly Motor exam: All 4 extremities antigravity and against resistance Coordination: Fine motor coordination was grossly normal.   Cranial nerves II through XII intact. Pupils equal round reactive to light and accommodation, EOMI.  No apparent nystagmus.    Assessment/Plan: 1. Functional deficits which require 3+ hours per day of interdisciplinary therapy in a comprehensive inpatient rehab setting. Physiatrist is providing close team supervision and 24 hour management of active  medical problems listed below. Physiatrist and rehab team continue to assess barriers to discharge/monitor patient progress toward functional and medical goals  Care Tool:  Bathing    Body parts bathed by patient: Right arm, Left arm, Chest, Abdomen, Face   Body parts bathed by helper: Front perineal area, Buttocks, Right upper leg, Left upper leg, Right lower leg, Left lower leg     Bathing assist Assist Level: Maximal Assistance - Patient 24 - 49%     Upper Body Dressing/Undressing Upper body dressing   What is the patient wearing?:  Pull over shirt    Upper body assist Assist Level: Minimal Assistance - Patient > 75%    Lower Body Dressing/Undressing Lower body dressing      What is the patient wearing?: Incontinence brief, Pants     Lower body assist Assist for lower body dressing: Maximal Assistance - Patient 25 - 49%     Toileting Toileting    Toileting assist Assist for toileting: Total Assistance - Patient < 25%     Transfers Chair/bed transfer  Transfers assist  Chair/bed transfer activity did not occur: Safety/medical concerns (unsafe to get up)  Chair/bed transfer assist level: 2 Helpers     Locomotion Ambulation   Ambulation assist   Ambulation activity did not occur: Safety/medical concerns          Walk 10 feet activity   Assist  Walk 10 feet activity did not occur: Safety/medical concerns        Walk 50 feet activity   Assist Walk 50 feet with 2 turns activity did not occur: Safety/medical concerns         Walk 150 feet activity   Assist Walk 150 feet activity did not occur: Safety/medical concerns         Walk 10 feet on uneven surface  activity   Assist Walk 10 feet on uneven surfaces activity did not occur: Safety/medical concerns         Wheelchair     Assist Is the patient using a wheelchair?: Yes Type of Wheelchair: Manual    Wheelchair assist level: Dependent - Patient 0%      Wheelchair 50 feet with 2 turns activity    Assist        Assist Level: Dependent - Patient 0%   Wheelchair 150 feet activity     Assist      Assist Level: Dependent - Patient 0%   Blood pressure (!) 141/71, pulse 67, temperature 97.7 F (36.5 C), temperature source Oral, resp. rate 18, SpO2 94%. Medical Problem List and Plan: 1. Functional deficits secondary to debility from GI bleed/delirium             -patient may  shower- cover skin tears             -ELOS/Goals: 12-14 days min A to supervision-2-17 discharge date             -  stable to continue CIR   - 1-28: If participatory with therapies and able to follow commands, but remains very confused, with active hallucination.  2.  Antithrombotics: -DVT/anticoagulation:  Pharmaceutical: Other (comment)-_Pradaxa resumed on 01/21              -antiplatelet therapy: N/A 3. Pain Management: Tylenol prn.  4. Mood/Behavior/Sleep/delirium: LCSW to follow for evaluation and support.              -antipsychotic agents: N/A--did not tolerate Seroquel    --Monitor sleep wake cycle. -Reminded  nursing to add sleep log 1-28:  Did poorly with scheduled nightly Seroquel on inpatient due to daytime somnolence.  Add mirtazapine 15 mg nightly, keep melatonin 5 mg as needed. 1-29: Reminded nursing to apply sleep log.  Added as needed trazodone 50 mg. 1-30: Slept intermittently 1 to 2 hours for a total of 4 hours overnight. start scheduled trazodone 50 mg nightly.  Encourage sleep, wake cycle. Encouraged family to promote compliance with medications and sleep/wake cycle. Did discontinue Protonix per family request, and Atarax as needed due to sedating effects. -Family notes that at baseline, patient does not sleep well at night, naps often during the day 1-31: Sleep log not updated, patient endorsing poor sleep.  Also endorsing significant symptoms of depression with therapies.  increase trazodone to 100 mg--No SI/HI, so will not involve psychiatry at this time.  5. Neuropsych/cognition: This patient is not capable of making decisions on his own behalf.   - 1-28: Adding telemonitoring, 4 side rails for safety.  Add Zyprexa 5 mg sublingual versus IM as needed for agitation, given patient is spitting most medication.  Restraints as above. --Have not required as needed Zyprexa  1/31: Overall, cognition seems to be improving a little today, with less preoccupation/distraction.  6. Skin/Wound Care: Routine pressure relief measures.    -Monitor of skin tears, bruises.   - Cover areas of skin tears  with Mepilex dressings to avoid compulsive picking  7. Fluids/Electrolytes/Nutrition: Monitor I/O check CMET in am   - 1/27: See below; 500 cc IVF today, encourage PO, recheck BMP in AM. Add calcium supplement  -1-28: Labs improved, encourage p.o.'s.  1-30: AKI again today, with poor p.o. intakes.  Initiate IV fluids, repeat BMP in AM.  Mag low, give 4 g IV repletion, repeat in AM.  1/31: Magnesium repleted.  Management of AKI as below.  P.o. intakes adequate  8. GIB/Duodenal AVM s/p APC:  To continue PPI BID X 4 weeks-->10/26/23   - HgB stable; see #18  -1-30: Family insistent on discontinuation of Protonix due to possible side effects of cognitive deficits/ parkinsonism; understand this is a very unlikely contributor, discussed risks versus benefits with recent GI bleed, they continue to request removal of medication.  -1/31:  Protonix Dced; will start famotidine 20 mg twice daily as alternative. Add CBC in AM, FOBT.  9. Right hemothorax s/p CT/fibrinolysis: Has been weaned off oxygen             --follow up with Dr. Irena Cords in 2 months w/repeat CT chest  10. Delirium/intermittent movement d/o: Delirium precautions.  - see #4  11. PAF/SVT/HTN: HR improved with titrated of coreg to 12.5 mg bid.  -1/29: Continues intermittently refusing medications, heart rate well-controlled, blood pressure rising.  Resume home olmesartan at low-dose 5 mg; substituted for formulary Avapro 37.5 mg daily.  Add hydralazine 10 mg every 8 hours as needed for systolic blood pressure greater than 180, diastolic greater than 110  1-30: Blood pressure remains elevated, but he only just stated taking his medications today.  Family to encourage compliance, BNP today downtrending from prior so unlikely heart failure exacerbation.  1/31: Blood pressure doing better, edema increasing but on IV fluids for AKI so expected. TEDs ordered.      10/29/2023    4:58 AM 10/28/2023    6:42 PM 10/28/2023    2:00 PM  Vitals with BMI   Systolic 141 122 454  Diastolic 71 58 70  Pulse 67 62 80  12.  Hypokalemia/Hypomagnesemia/hypocalcemia: K+ 3.1 this AM- was given KCL- will recheck in AM   - 1/27: K stable/improved; will start Oscal 500 mg BID for Ca 7.8  1-28: K3.3, start 20 mill equivalents potassium twice daily.  Retest later this week.  1-30: Potassium unchanged, resume twice daily repletion.  4 g IV mag as above 1-31: Magnesium repleted, potassium improving.  13. HCAP: Treated with 7 day course of Zosyn   - No respiratory distress  -1-30: Chest x-ray today--no acute intrapulmonary process.  Pleural effusions resolved.  14. Gross hematuria: Has resolved--->to follow up with Dr. Annabell Howells on outpatient basis.   -1-29: High proteinuria and some blood in urinalysis; negative for infection.  Consistent with prior urinalysis  -1-31: Due to persistent lower abdominal pain and increasing AKI despite IV fluids; concern for possible kidney stone and renal obstruction.  CT abdomen and pelvis ordered for today  15. T2DM: Hgb A1c- 5.5. Diet controlled at this time             --has been off actos.  No results for input(s): "GLUCAP" in the last 72 hours.    16. Acute on chronic renal failure -CKD stage IIIb:  BUN/SCr improving. Baseline per OP records appear 1.2-1.5.  --Followed by Dr. Zetta Bills w/plan for renal bx prior to admission.              --encourage fluid intake- Cr 1.33 this AM up from 1.0- will recheck in Ama nd push fluids.   1/27: Continues uptrending to 1.5 today; Is only 240 yesterday; Push PO fluids and give 500 cc bolus today, check in AM  1-28: Creatinine improved to 1.3.  P.o. intakes remain poor, will continue daily monitoring and bolus as needed  1-30: Resume IV fluids today for AKI.  BMP in AM.  1/31: Received 500 cc overnight before pulling IV, creatinine uptrending.  Getting CT abdomen and pelvis today due to concern for renal stone as above.  17. Hx Pancytopenia:  Bone bx w/ ICUS (brewing  myelodysplasia) per Dr. Leonides Schanz.    - 1/27: WBC down slightly; monitor--stable  18. Abdominal pain/nausea   - KUB without concerning findings 1/27   - Zofran PRN added   - on protonix 40 mg BID--DC'd per family request, changed to famotidine 20 mg twice daily 1/31   - Having regular Bms 1-28: Multiple incontinent bowel movements overnight.  Tylenol scheduled for generalized pain. 1/29: No further bowel movements overnight. 1-30: Ongoing abdominal pain.  Labs today with normal LFTs, negative lactic acid, WBCs unchanged.  No fevers, chills, or other signs of infection.  Tolerating p.o. intakes. 1/31: CT abdomen and pelvis ordered for today for evaluation giving unremitting symptoms.  19.  Bladder incontinence/dysuria.  PVRs next 2 days.  Labs not concerning for infection.  On Flomax 0.4 mg daily  -1-29: PVRs low.  mixed continence.  Continue timed toileting.  Urinalysis due to endorsing dysuria.--Negative   LOS: 5 days A FACE TO FACE EVALUATION WAS PERFORMED  Joshua [name] 10/29/2023, 9:47 AM

## 2023-10-29 NOTE — Progress Notes (Signed)
Occupational Therapy Weekly Progress Note  Patient Details  Name: Joshua [name] MRN: [id number] Date of Birth: October 22, 1950  Beginning of progress report period: October 25, 2023 End of progress report period: October 31, 2023  {CHL IP REHAB OT TIME CALCULATIONS:304400400}   Patient has met {number 1-5:22450} of 3 short term goals.  ***  Patient continues to demonstrate the following deficits: muscle weakness, decreased motor planning, decreased initiation, decreased attention, decreased awareness, decreased problem solving, decreased safety awareness, decreased memory, and delayed processing, and decreased sitting balance, decreased standing balance, decreased postural control, and decreased balance strategies and therefore will continue to benefit from skilled OT intervention to enhance overall performance with BADL.  Patient {LTG progression:3041653}.  {plan of WUJW:1191478}  OT Short Term Goals {OT GNF:6213086}  Skilled Therapeutic Interventions/Progress Updates:      Therapy Documentation Precautions:  Precautions Precautions: Fall Precaution Comments: continous spitting Restrictions Weight Bearing Restrictions Per Provider Order: No    Therapy/Group: {Therapy/Group:3049007}  Tait Balistreri, Charisse March 10/29/2023, 3:54 PM

## 2023-10-29 NOTE — Progress Notes (Signed)
Physical Therapy Session Note  Patient Details  Name: Joshua [name] MRN: [id number] Date of Birth: 10/27/1950  Today's Date: 10/29/2023 PT Individual Time: 0918-1001 + [id number] PT Individual Time Calculation (min): 43 min + 47 min  Short Term Goals: Week 1:  PT Short Term Goal 1 (Week 1): Pt will complete bed mobility with mod assist and min verbal cues PT Short Term Goal 2 (Week 1): Pt will complete transfers with mod assist and LRAD PT Short Term Goal 3 (Week 1): Pt will ambulate 29' with mod assist  Skilled Therapeutic Interventions/Progress Updates:     SESSION 1: Pt presents in room in [facility], stating he feels "the same as usual" and states he's used the bathroom "number one and number two." Pt agreeable to PT session and states he has pain in abdomen. Session focused on gait training for tolerance to upright. Pt completes sit<>stands to grab bar in bathroom and RW with CGA/min assist. Pt noted to have more appropriate behaviors and awareness this session as well as ability to follow verbal commands however pt continues to endorse that he is "drunk" throughout session.  Pt positioned in bathroom dependently via TIS, completes sit<>stand to grab bar in bathroom with CGA. Therapist assist with brief management however pt noted to not be incontinent, and provided with max assist for managing pants over hips. Pt provided with orientation to this and demonstrates improved awareness. Pt demonstrates  Pt ambulates 2x70' then 3x150' with RW CGA progress to close supervision with final trial with cues for obstacle negotiation and upright gaze. Pt then ambulates around 6 cones placed 2' apart to promote improved obstacle negotiation with pt demonstrating excellent obstacle negotiation with cones and improved use of RW with turning to sit in WC.  Pt returns to room and remains seated in [facility] with all needs within reach, cal light in place and chair alarm donned and activated at end of  session.    SESSION 2: Pt presents in room in [facility] finishing up using restroom with nursing staff and returning to [facility]. Pt agreeable to PT, denies pain. Session focused on gait training, therapeutic activities for participating with self care tasks, and therapeutic exercise to promote BLE strengthening and dynamic standing balance.  Pt transported to day room dependently for time management. Therapist provides rollator for pt to use for energy conservation however pt declines to use and requesting to ambulate without device instead. Pt ambulates 28' and 150' with IV pole for RUE support only, demonstrating forward trunk flexion and downward gaze, unable to correct with verbal cues, short step length requires CGA/min assist for postural stability.  Pt then completes therapeutic exercise for BLE strengthening and dynamic standing balance with little to no UE support including: - mini squats (min assist for postural stability with posterior LOB) - standing marches x20 alternating BLE - lateral step x10 BLE (min assist)  Pt provided with seated rest breaks between all gait trials and exercises to promote energy conservation and quality with tasks.  Pt returned to room via [facility], reports needing to use restroom however pt stating unsure. Therapist reorients pt to having used restroom with nursing prior to session but pt requesting to try again. Pt ambulates to/from bathroom with RW CGA and requires max verbal cues for RW mgmt for turning to sit on BSC over toilet. Pt unable to void, attempts to void in standing to urinal however continues to be unable to void. Pt able to manage pants and brief with min assist.  Pt returns to seated in [facility] where he remains with all needs within reach, cal light in place and chair alarm donned and activated at end of session.   Therapy Documentation Precautions:  Precautions Precautions: Fall Precaution Comments: continous spitting Restrictions Weight Bearing  Restrictions Per Provider Order: No    Therapy/Group: Individual Therapy  Edwin Cap PT, DPT 10/29/2023, 1:01 PM

## 2023-10-29 NOTE — Progress Notes (Signed)
Speech Language Pathology Daily Session Note  Patient Details  Name: Joshua [name] MRN: [id number] Date of Birth: [birth year]  Today's Date: 10/29/2023 SLP Individual Time: 1022-1100 SLP Individual Time Calculation (min): 38 min  Short Term Goals: Week 1: SLP Short Term Goal 1 (Week 1): Patient will orient to date and location using external aids in 4/5 opportunities given mod assist. SLP Short Term Goal 2 (Week 1): Patient will solve basic environmental problems with 75% accuracy given mod assist. SLP Short Term Goal 3 (Week 1): Patient will demonstrate awareness of deficits by stating 2 cognitive and 2 physical deficits given max assist. SLP Short Term Goal 4 (Week 1): Patient will utilize external aids to recall daily events with 75% accuracy given mod assist.  Skilled Therapeutic Interventions: SLP conducted skilled therapy session targeting cognitive retraining goals. Patient engaged in conversation with MD at beginning of SLP session and participated in following directions and memory task with overall supervision-min assist. Patient recalled 1/3 words provided by MD with mod assist overall. After interaction, discussed with SLP events of the morning, recalling broad but not specific details with modI. Cognitive status is difficult to ascertain this date, as patient demonstrates visibly depressed mood which impacts his participation, probably more so than initiation/processing impairments. Patient engaged in game of Connect 4 and appropriately sustained attention, problem solved, sequenced, and anticipated clinician's moves with an average of min-mod assist needed throughout the task. Patient continues to endorse that he is not getting any better and states that staff are "never going to let him go home". He makes several references throughout session to not having any control over his situation, thus SLP attempted to provide counseling and provided education re: areas in which he has a choice to  make decisions, though counseling appeared ineffective in increasing patient awareness of situation. Patient was left in chair with call bell in reach and chair set. SLP will continue to target goals per plan of care.       Pain Pain Assessment Pain Scale: Faces Pain Score: 0-No pain Faces Pain Scale: Hurts little more Pain Location: Abdomen  Therapy/Group: Individual Therapy  Jeannie Done, M.A., CCC-SLP  Yetta Barre 10/29/2023, 12:44 PM

## 2023-10-30 LAB — BASIC METABOLIC PANEL
Anion gap: 9 (ref 5–15)
BUN: 27 mg/dL — ABNORMAL HIGH (ref 8–23)
CO2: 21 mmol/L — ABNORMAL LOW (ref 22–32)
Calcium: 8.4 mg/dL — ABNORMAL LOW (ref 8.9–10.3)
Chloride: 111 mmol/L (ref 98–111)
Creatinine, Ser: 1.87 mg/dL — ABNORMAL HIGH (ref 0.61–1.24)
GFR, Estimated: 37 mL/min — ABNORMAL LOW (ref >60)
Glucose, Bld: 100 mg/dL — ABNORMAL HIGH (ref 70–99)
Potassium: 4 mmol/L (ref 3.5–5.1)
Sodium: 141 mmol/L (ref 135–145)

## 2023-10-30 LAB — CBC WITH DIFFERENTIAL/PLATELET
Abs Immature Granulocytes: 0.04 10*3/uL (ref 0.00–0.07)
Basophils Absolute: 0 10*3/uL (ref 0.0–0.1)
Basophils Relative: 1 %
Eosinophils Absolute: 0.1 10*3/uL (ref 0.0–0.5)
Eosinophils Relative: 4 %
HCT: 27.8 % — ABNORMAL LOW (ref 39.0–52.0)
Hemoglobin: 8.7 g/dL — ABNORMAL LOW (ref 13.0–17.0)
Immature Granulocytes: 1 %
Lymphocytes Relative: 28 %
Lymphs Abs: 1 10*3/uL (ref 0.7–4.0)
MCH: 30.3 pg (ref 26.0–34.0)
MCHC: 31.3 g/dL (ref 30.0–36.0)
MCV: 96.9 fL (ref 80.0–100.0)
Monocytes Absolute: 0.3 10*3/uL (ref 0.1–1.0)
Monocytes Relative: 8 %
Neutro Abs: 2 10*3/uL (ref 1.7–7.7)
Neutrophils Relative %: 58 %
Platelets: 135 10*3/uL — ABNORMAL LOW (ref 150–400)
RBC: 2.87 MIL/uL — ABNORMAL LOW (ref 4.22–5.81)
RDW: 18.8 % — ABNORMAL HIGH (ref 11.5–15.5)
WBC: 3.4 10*3/uL — ABNORMAL LOW (ref 4.0–10.5)
nRBC: 0 % (ref 0.0–0.2)

## 2023-10-30 NOTE — Progress Notes (Signed)
PROGRESS NOTE   Subjective/Complaints:   Feels good today , no abd pain  Family at bedside, discussed need to increase fluid intake.  Unable to obtain full ROS due to patient cognitive status.   + chest pain - with movement, generalized + abdominal pain - ongoing, unremitting + poor sleep - ongoing  Objective:   DG Chest 2 View Result Date: 10/28/2023 CLINICAL DATA:  142230 Pleural effusion 142230 EXAM: CHEST - 2 VIEW COMPARISON:  Chest x-ray 10/18/2023 FINDINGS: The heart and mediastinal contours are unchanged. Coronary artery stent. Low lung volumes. No focal consolidation. No pulmonary edema. No pleural effusion. No pneumothorax. No acute osseous abnormality. IMPRESSION: Low lung volumes with no active cardiopulmonary disease. Electronically Signed   By: Tish Frederickson M.D.   On: 10/28/2023 18:53    Recent Labs    10/28/23 1420 10/30/23 0611  WBC 6.1 3.4*  HGB 9.0* 8.7*  HCT 28.9* 27.8*  PLT 208 135*   Recent Labs    10/29/23 0621 10/30/23 0611  NA 139 141  K 3.4* 4.0  CL 108 111  CO2 20* 21*  GLUCOSE 94 100*  BUN 25* 27*  CREATININE 1.84* 1.87*  CALCIUM 7.9* 8.4*    Intake/Output Summary (Last 24 hours) at 10/30/2023 1215 Last data filed at 10/30/2023 1029 Gross per 24 hour  Intake 694 ml  Output 200 ml  Net 494 ml        Physical Exam: Vital Signs Blood pressure (!) 159/96, pulse 66, temperature 97.9 F (36.6 C), temperature source Oral, resp. rate 18, SpO2 94%.  Physical Exam Vitals and nursing note reviewed.   PE: Constitution: Appropriate appearance for age. No apparent distress. +Obese.  Sitting up in wheelchair.  Resp: No respiratory distress. No accessory muscle usage. on RA and CTAB Cardio: Well perfused appearance.  1+ pitting R>L peripheral edema. Capillary refill brisk.  Abdomen: Nondistended but protuberant. +BS.  Minimally tender to palpation in lower quadrants. Psych: Flat.  Minimally interactive, answers all questions and participate but with far off looks.  Intermittent hallucinations and delusional content.  Endorsing depression without SI/HI.  Skin: A lot of skin tears on UE"s and bruising- covered with dressings -mostly healing, although he is intermittently picking at scabs on hands  Hemosiderin straining b/l calfs  Neuro: AAOx to self, place, time, and year.  Can follow all simple commands. +  preoccupation/poor attention--much improved today, still altered + Waxing and waning memory deficit Reflexes intact Sensory exam: revealed normal sensation in all dermatomal regions in BL UE and LE grossly Motor exam: All 4 extremities antigravity and against resistance Coordination: Fine motor coordination was grossly normal.   Cranial nerves II through XII intact. Pupils equal round reactive to light and accommodation, EOMI.  No apparent nystagmus.    Assessment/Plan: 1. Functional deficits which require 3+ hours per day of interdisciplinary therapy in a comprehensive inpatient rehab setting. Physiatrist is providing close team supervision and 24 hour management of active medical problems listed below. Physiatrist and rehab team continue to assess barriers to discharge/monitor patient progress toward functional and medical goals  Care Tool:  Bathing    Body parts bathed by patient: Right arm, Left arm,  Chest, Abdomen, Face   Body parts bathed by helper: Front perineal area, Buttocks, Right upper leg, Left upper leg, Right lower leg, Left lower leg     Bathing assist Assist Level: Maximal Assistance - Patient 24 - 49%     Upper Body Dressing/Undressing Upper body dressing   What is the patient wearing?: Pull over shirt    Upper body assist Assist Level: Minimal Assistance - Patient > 75%    Lower Body Dressing/Undressing Lower body dressing      What is the patient wearing?: Incontinence brief, Pants     Lower body assist Assist for lower  body dressing: Maximal Assistance - Patient 25 - 49%     Toileting Toileting    Toileting assist Assist for toileting: Total Assistance - Patient < 25%     Transfers Chair/bed transfer  Transfers assist  Chair/bed transfer activity did not occur: Safety/medical concerns (unsafe to get up)  Chair/bed transfer assist level: 2 Helpers     Locomotion Ambulation   Ambulation assist   Ambulation activity did not occur: Safety/medical concerns          Walk 10 feet activity   Assist  Walk 10 feet activity did not occur: Safety/medical concerns        Walk 50 feet activity   Assist Walk 50 feet with 2 turns activity did not occur: Safety/medical concerns         Walk 150 feet activity   Assist Walk 150 feet activity did not occur: Safety/medical concerns         Walk 10 feet on uneven surface  activity   Assist Walk 10 feet on uneven surfaces activity did not occur: Safety/medical concerns         Wheelchair     Assist Is the patient using a wheelchair?: Yes Type of Wheelchair: Manual    Wheelchair assist level: Dependent - Patient 0%      Wheelchair 50 feet with 2 turns activity    Assist        Assist Level: Dependent - Patient 0%   Wheelchair 150 feet activity     Assist      Assist Level: Dependent - Patient 0%   Blood pressure (!) 159/96, pulse 66, temperature 97.9 F (36.6 C), temperature source Oral, resp. rate 18, SpO2 94%. Medical Problem List and Plan: 1. Functional deficits secondary to debility from GI bleed/delirium             -patient may  shower- cover skin tears             -ELOS/Goals: 12-14 days min A to supervision-2-17 discharge date             - stable to continue CIR   - Status much improved today  2.  Antithrombotics: -DVT/anticoagulation:  Pharmaceutical: Other (comment)-_Pradaxa resumed on 01/21              -antiplatelet therapy: N/A 3. Pain Management: Tylenol prn.  4.  Mood/Behavior/Sleep/delirium: LCSW to follow for evaluation and support.              -antipsychotic agents: N/A--did not tolerate Seroquel    --Monitor sleep wake cycle. -Reminded nursing to add sleep log 1-28:  Did poorly with scheduled nightly Seroquel on inpatient due to daytime somnolence.  Add mirtazapine 15 mg nightly, keep melatonin 5 mg as needed. 1-29: Reminded nursing to apply sleep log.  Added as needed trazodone 50 mg. 1-30: Slept intermittently 1 to 2  hours for a total of 4 hours overnight. start scheduled trazodone 50 mg nightly.  Encourage sleep, wake cycle. Encouraged family to promote compliance with medications and sleep/wake cycle. Did discontinue Protonix per family request, and Atarax as needed due to sedating effects. -Family notes that at baseline, patient does not sleep well at night, naps often during the day 1-31: Sleep log not updated, patient endorsing poor sleep.  Also endorsing significant symptoms of depression with therapies.  increase trazodone to 100 mg--No SI/HI, so will not involve psychiatry at this time.  5. Neuropsych/cognition: This patient is not capable of making decisions on his own behalf.   - 1-28: Adding telemonitoring, 4 side rails for safety.  Add Zyprexa 5 mg sublingual versus IM as needed for agitation, given patient is spitting most medication.  Restraints as above. --Have not required as needed Zyprexa  1/31: Overall, cognition seems to be improving a little today, with less preoccupation/distraction. Patient is oriented today 6. Skin/Wound Care: Routine pressure relief measures.    -Monitor of skin tears, bruises.   - Cover areas of skin tears with Mepilex dressings to avoid compulsive picking  7. Fluids/Electrolytes/Nutrition: Monitor I/O check CMET in am   - 1/27: See below; 500 cc IVF today, encourage PO, recheck BMP in AM. Add calcium supplement  -1-28: Labs improved, encourage p.o.'s.  1-30: AKI again today, with poor p.o. intakes.   Initiate IV fluids, repeat BMP in AM.  Mag low, give 4 g IV repletion, repeat in AM.  1/31: Magnesium repleted.  Management of AKI as below.  P.o. intakes adequate  8. GIB/Duodenal AVM s/p APC:  To continue PPI BID X 4 weeks-->10/26/23   - HgB stable; see #18  -1-30: Family insistent on discontinuation of Protonix due to possible side effects of cognitive deficits/ parkinsonism; understand this is a very unlikely contributor, discussed risks versus benefits with recent GI bleed, they continue to request removal of medication.  -1/31:  Protonix Dced; will start famotidine 20 mg twice daily as alternative. Add CBC in AM, FOBT. 2/1 cognition improved, now oriented and not agitated 9. Right hemothorax s/p CT/fibrinolysis: Has been weaned off oxygen             --follow up with Dr. Irena Cords in 2 months w/repeat CT chest  10. Delirium/intermittent movement d/o: Delirium precautions.  - see #4  11. PAF/SVT/HTN: HR improved with titrated of coreg to 12.5 mg bid.  -1/29: Continues intermittently refusing medications, heart rate well-controlled, blood pressure rising.  Resume home olmesartan at low-dose 5 mg; substituted for formulary Avapro 37.5 mg daily.  Add hydralazine 10 mg every 8 hours as needed for systolic blood pressure greater than 180, diastolic greater than 110  1-30: Blood pressure remains elevated, but he only just stated taking his medications today.  Family to encourage compliance, BNP today downtrending from prior so unlikely heart failure exacerbation.  1/31: Blood pressure doing better, edema increasing but on IV fluids for AKI so expected. TEDs ordered.      10/30/2023    5:59 AM 10/29/2023    7:18 PM 10/29/2023    2:00 PM  Vitals with BMI  Systolic 159 146 161  Diastolic 96 77 75  Pulse 66 58 70     12.  Hypokalemia/Hypomagnesemia/hypocalcemia: K+ 3.1 this AM- was given KCL- will recheck in AM   - 1/27: K stable/improved; will start Oscal 500 mg BID for Ca 7.8  1-28: K3.3,  start 20 mill equivalents potassium twice daily.  Retest  later this week.  1-30: Potassium unchanged, resume twice daily repletion.  4 g IV mag as above Calcium repleted    Latest Ref Rng & Units 10/30/2023    6:11 AM 10/29/2023    6:21 AM 10/28/2023    5:41 AM  BMP  Glucose 70 - 99 mg/dL 324  94  95   BUN 8 - 23 mg/dL 27  25  22    Creatinine 0.61 - 1.24 mg/dL 4.01  0.27  2.53   Sodium 135 - 145 mmol/L 141  139  141   Potassium 3.5 - 5.1 mmol/L 4.0  3.4  3.3   Chloride 98 - 111 mmol/L 111  108  108   CO2 22 - 32 mmol/L 21  20  22    Calcium 8.9 - 10.3 mg/dL 8.4  7.9  8.0   Continue with potassium supplementation 13. HCAP: Treated with 7 day course of Zosyn   - No respiratory distress  -1-30: Chest x-ray today--no acute intrapulmonary process.  Pleural effusions resolved.  14. Gross hematuria: Has resolved--->to follow up with Dr. Annabell Howells on outpatient basis.   -1-29: High proteinuria and some blood in urinalysis; negative for infection.  Consistent with prior urinalysis  Abdominal pain, abdominal exam is normal, discontinue CT abdomen, would be concerned about worsening renal status with Omnipaque  15. T2DM: Hgb A1c- 5.5. Diet controlled at this time             --has been off actos.  No results for input(s): "GLUCAP" in the last 72 hours.    16. Acute on chronic renal failure -CKD stage IIIb:  BUN/SCr improving. Baseline per OP records appear 1.2-1.5.  --Followed by Dr. Zetta Bills w/plan for renal bx prior to admission.              --encourage fluid intake- Cr 1.33 this AM up from 1.0- will recheck in Ama nd push fluids.   1/27: Continues uptrending to 1.5 today; Is only 240 yesterday; Push PO fluids and give 500 cc bolus today, check in AM  1-28: Creatinine improved to 1.3.  P.o. intakes remain poor, will continue daily monitoring and bolus as needed  1-30: Resume IV fluids today for AKI.  BMP in AM.  1/31: Received 500 cc overnight before pulling IV, creatinine uptrending.  Getting CT  abdomen and pelvis today due to concern for renal stone as above.  17. Hx Pancytopenia:  Bone bx w/ ICUS (brewing myelodysplasia) per Dr. Leonides Schanz.    - 1/27: WBC down slightly; monitor--stable    Latest Ref Rng & Units 10/30/2023    6:11 AM 10/28/2023    2:20 PM 10/28/2023    5:41 AM  CBC  WBC 4.0 - 10.5 K/uL 3.4  6.1  4.3   Hemoglobin 13.0 - 17.0 g/dL 8.7  9.0  9.4   Hematocrit 39.0 - 52.0 % 27.8  28.9  29.7   Platelets 150 - 400 K/uL 135  208  177   Platelets slightly below lower limit of normal, recheck in a.m., reviewed medications do not see any obvious medications that would be causing this  18. Abdominal pain/nausea   - KUB without concerning findings 1/27   - Zofran PRN added   - on protonix 40 mg BID--DC'd per family request, changed to famotidine 20 mg resolved 2/1  19.  Bladder incontinence/dysuria.  PVRs next 2 days.  Labs not concerning for infection.  On Flomax 0.4 mg daily  -1-29: PVRs low.  mixed continence.  Continue timed toileting.  Urinalysis due to endorsing dysuria.--Negative   LOS: 6 days A FACE TO FACE EVALUATION WAS PERFORMED  Erick Colace 10/30/2023, 12:15 PM

## 2023-10-31 LAB — CBC WITH DIFFERENTIAL/PLATELET
Abs Immature Granulocytes: 0.07 10*3/uL (ref 0.00–0.07)
Basophils Absolute: 0 10*3/uL (ref 0.0–0.1)
Basophils Relative: 1 %
Eosinophils Absolute: 0.1 10*3/uL (ref 0.0–0.5)
Eosinophils Relative: 4 %
HCT: 28.3 % — ABNORMAL LOW (ref 39.0–52.0)
Hemoglobin: 8.7 g/dL — ABNORMAL LOW (ref 13.0–17.0)
Immature Granulocytes: 2 %
Lymphocytes Relative: 26 %
Lymphs Abs: 0.9 10*3/uL (ref 0.7–4.0)
MCH: 30.2 pg (ref 26.0–34.0)
MCHC: 30.7 g/dL (ref 30.0–36.0)
MCV: 98.3 fL (ref 80.0–100.0)
Monocytes Absolute: 0.3 10*3/uL (ref 0.1–1.0)
Monocytes Relative: 9 %
Neutro Abs: 2 10*3/uL (ref 1.7–7.7)
Neutrophils Relative %: 58 %
Platelets: 122 10*3/uL — ABNORMAL LOW (ref 150–400)
RBC: 2.88 MIL/uL — ABNORMAL LOW (ref 4.22–5.81)
RDW: 19.5 % — ABNORMAL HIGH (ref 11.5–15.5)
WBC: 3.4 10*3/uL — ABNORMAL LOW (ref 4.0–10.5)
nRBC: 0 % (ref 0.0–0.2)

## 2023-10-31 NOTE — Plan of Care (Signed)
  Problem: Consults Goal: RH GENERAL PATIENT EDUCATION Description: See Patient Education module for education specifics. Outcome: Progressing   Problem: RH BOWEL ELIMINATION Goal: RH STG MANAGE BOWEL WITH ASSISTANCE Description: STG Manage Bowel with minimal assistance Assistance. Outcome: Progressing   Problem: RH BLADDER ELIMINATION Goal: RH STG MANAGE BLADDER WITH ASSISTANCE Description: STG Manage Bladder With minimal  Assistance Outcome: Progressing   Problem: RH SKIN INTEGRITY Goal: RH STG SKIN FREE OF INFECTION/BREAKDOWN Description: Manage skin infection / breakdown with educational materials with minimal assistance Outcome: Progressing Goal: RH STG MAINTAIN SKIN INTEGRITY WITH ASSISTANCE Description: STG Maintain Skin Integrity With minimal  Assistance. Outcome: Progressing   Problem: RH SAFETY Goal: RH STG ADHERE TO SAFETY PRECAUTIONS W/ASSISTANCE/DEVICE Description: STG Adhere to Safety Precautions With minimal Assistance/Device. Outcome: Progressing   Problem: RH PAIN MANAGEMENT Goal: RH STG PAIN MANAGED AT OR BELOW PT'S PAIN GOAL Description: <4 w/ prns Outcome: Progressing   Problem: RH KNOWLEDGE DEFICIT GENERAL Goal: RH STG INCREASE KNOWLEDGE OF SELF CARE AFTER HOSPITALIZATION Description:  Patient manage  self care at discharge using educational materials with minimal assistance   Outcome: Progressing   Problem: Safety: Goal: Non-violent Restraint(s) Outcome: Progressing

## 2023-10-31 NOTE — Progress Notes (Signed)
PROGRESS NOTE   Subjective/Complaints:  Doing ok today , discussed pancytopenia, he states he had bone marrow biopsy for this (08/07/2022) Remembered that he has not had any therapy over the weekend thus far ROS Denies pain , breathing issues , N/V/D  Objective:   No results found.   Recent Labs    10/30/23 0611 10/31/23 0614  WBC 3.4* 3.4*  HGB 8.7* 8.7*  HCT 27.8* 28.3*  PLT 135* 122*   Recent Labs    10/29/23 0621 10/30/23 0611  NA 139 141  K 3.4* 4.0  CL 108 111  CO2 20* 21*  GLUCOSE 94 100*  BUN 25* 27*  CREATININE 1.84* 1.87*  CALCIUM 7.9* 8.4*    Intake/Output Summary (Last 24 hours) at 10/31/2023 1249 Last data filed at 10/31/2023 0844 Gross per 24 hour  Intake 480 ml  Output 1000 ml  Net -520 ml        Physical Exam: Vital Signs Blood pressure 133/61, pulse 63, temperature 98 F (36.7 C), temperature source Oral, resp. rate 18, SpO2 93%.  Physical Exam Vitals and nursing note reviewed.   PE: Constitution: Appropriate appearance for age. No apparent distress. +Obese.  Sitting up in wheelchair.  Resp: No respiratory distress. No accessory muscle usage. on RA and CTAB Cardio: Well perfused appearance.  1+ pitting R>L peripheral edema. Capillary refill brisk.  Abdomen: Nondistended but protuberant. +BS.  Minimally tender to palpation in lower quadrants. Psych: Flat. But appropriate, without lability or agitation  Skin: A lot of skin tears on UE"s and bruising- covered with dressings -mostly healing, although he is intermittently picking at scabs on hands  Hemosiderin staining b/l calfs  Neuro: AAOx to self, place, time, and year.  Can follow all simple commands. Long-term memory is good, intermediate-term appears okay Reflexes intact Sensory exam: revealed normal sensation in all dermatomal regions in BL UE and LE grossly Motor exam: All 4 extremities antigravity and against  resistance Coordination: Fine motor coordination was grossly normal.   Cranial nerves II through XII intact. Pupils equal round reactive to light and accommodation, EOMI.  No apparent nystagmus.    Assessment/Plan: 1. Functional deficits which require 3+ hours per day of interdisciplinary therapy in a comprehensive inpatient rehab setting. Physiatrist is providing close team supervision and 24 hour management of active medical problems listed below. Physiatrist and rehab team continue to assess barriers to discharge/monitor patient progress toward functional and medical goals  Care Tool:  Bathing    Body parts bathed by patient: Right arm, Left arm, Chest, Abdomen, Face   Body parts bathed by helper: Front perineal area, Buttocks, Right upper leg, Left upper leg, Right lower leg, Left lower leg     Bathing assist Assist Level: Maximal Assistance - Patient 24 - 49%     Upper Body Dressing/Undressing Upper body dressing   What is the patient wearing?: Pull over shirt    Upper body assist Assist Level: Minimal Assistance - Patient > 75%    Lower Body Dressing/Undressing Lower body dressing      What is the patient wearing?: Incontinence brief, Pants     Lower body assist Assist for lower body dressing: Maximal Assistance -  Patient 25 - 49%     Toileting Toileting    Toileting assist Assist for toileting: Total Assistance - Patient < 25%     Transfers Chair/bed transfer  Transfers assist  Chair/bed transfer activity did not occur: Safety/medical concerns (unsafe to get up)  Chair/bed transfer assist level: 2 Helpers     Locomotion Ambulation   Ambulation assist   Ambulation activity did not occur: Safety/medical concerns          Walk 10 feet activity   Assist  Walk 10 feet activity did not occur: Safety/medical concerns        Walk 50 feet activity   Assist Walk 50 feet with 2 turns activity did not occur: Safety/medical concerns          Walk 150 feet activity   Assist Walk 150 feet activity did not occur: Safety/medical concerns         Walk 10 feet on uneven surface  activity   Assist Walk 10 feet on uneven surfaces activity did not occur: Safety/medical concerns         Wheelchair     Assist Is the patient using a wheelchair?: Yes Type of Wheelchair: Manual    Wheelchair assist level: Dependent - Patient 0%      Wheelchair 50 feet with 2 turns activity    Assist        Assist Level: Dependent - Patient 0%   Wheelchair 150 feet activity     Assist      Assist Level: Dependent - Patient 0%   Blood pressure 133/61, pulse 63, temperature 98 F (36.7 C), temperature source Oral, resp. rate 18, SpO2 93%. Medical Problem List and Plan: 1. Functional deficits secondary to debility from GI bleed/delirium             -patient may  shower- cover skin tears             -ELOS/Goals: 12-14 days min A to supervision-2-17 discharge date             - stable to continue CIR   - Status much improved today  2.  Antithrombotics: -DVT/anticoagulation:  Pharmaceutical: Other (comment)-_Pradaxa resumed on 01/21              -antiplatelet therapy: N/A 3. Pain Management: Tylenol prn.  4. Mood/Behavior/Sleep/delirium: LCSW to follow for evaluation and support.              -antipsychotic agents: N/A--did not tolerate Seroquel    --Monitor sleep wake cycle. -Reminded nursing to add sleep log 1-28:  Did poorly with scheduled nightly Seroquel on inpatient due to daytime somnolence.  Add mirtazapine 15 mg nightly, keep melatonin 5 mg as needed. 1-29: Reminded nursing to apply sleep log.  Added as needed trazodone 50 mg. 1-30: Slept intermittently 1 to 2 hours for a total of 4 hours overnight. start scheduled trazodone 50 mg nightly.  Encourage sleep, wake cycle. Encouraged family to promote compliance with medications and sleep/wake cycle. Did discontinue Protonix per family request, and Atarax as  needed due to sedating effects. -Family notes that at baseline, patient does not sleep well at night, naps often during the day 1-31: Sleep log not updated, patient endorsing poor sleep.  Also endorsing significant symptoms of depression with therapies.  increase trazodone to 100 mg--No SI/HI, so will not involve psychiatry at this time. 2/2 sleep disturbed by frequent urination but otherwise no complaints 5. Neuropsych/cognition: This patient is not capable of making decisions  on his own behalf.   - 1-28: Adding telemonitoring, 4 side rails for safety.  Add Zyprexa 5 mg sublingual versus IM as needed for agitation, given patient is spitting most medication.  Restraints as above. --Have not required as needed Zyprexa  1/31: Overall, cognition seems to be improving a little today, with less preoccupation/distraction. Patient is oriented today 6. Skin/Wound Care: Routine pressure relief measures.    -Monitor of skin tears, bruises.   - Cover areas of skin tears with Mepilex dressings to avoid compulsive picking  7. Fluids/Electrolytes/Nutrition: Monitor I/O check CMET in am   - 1/27: See below; 500 cc IVF today, encourage PO, recheck BMP in AM. Add calcium supplement  -1-28: Labs improved, encourage p.o.'s.  1-30: AKI again today, with poor p.o. intakes.  Initiate IV fluids, repeat BMP in AM.  Mag low, give 4 g IV repletion, repeat in AM.  1/31: Magnesium repleted.  Management of AKI as below.  P.o. intakes adequate  8. GIB/Duodenal AVM s/p APC:  To continue PPI BID X 4 weeks-->10/26/23   - HgB stable; see #18  -1-30: Family insistent on discontinuation of Protonix due to possible side effects of cognitive deficits/ parkinsonism; understand this is a very unlikely contributor, discussed risks versus benefits with recent GI bleed, they continue to request removal of medication.  -1/31:  Protonix Dced; will start famotidine 20 mg twice daily as alternative. Add CBC in AM, FOBT. 2/2 cognition  improved, now oriented and not agitated 9. Right hemothorax s/p CT/fibrinolysis: Has been weaned off oxygen             --follow up with Dr. Irena Cords in 2 months w/repeat CT chest  10. Delirium/intermittent movement d/o: Improved 11. PAF/SVT/HTN: HR improved with titrated of coreg to 12.5 mg bid.  -1/29: Continues intermittently refusing medications, heart rate well-controlled, blood pressure rising.  Resume home olmesartan at low-dose 5 mg; substituted for formulary Avapro 37.5 mg daily.  Add hydralazine 10 mg every 8 hours as needed for systolic blood pressure greater than 180, diastolic greater than 110  1-30: Blood pressure remains elevated, but he only just stated taking his medications today.  Family to encourage compliance, BNP today downtrending from prior so unlikely heart failure exacerbation.  2/2 controlled     10/31/2023    8:54 AM 10/31/2023    2:06 AM 10/30/2023    7:34 PM  Vitals with BMI  Systolic 133 139 782  Diastolic 61 70 77  Pulse 63 56 61     12.  Hypokalemia/Hypomagnesemia/hypocalcemia: K+ 3.1 this AM- was given KCL- will recheck in AM   - 1/27: K stable/improved; will start Oscal 500 mg BID for Ca 7.8  1-28: K3.3, start 20 mill equivalents potassium twice daily.  Retest later this week.  1-30: Potassium unchanged, resume twice daily repletion.  4 g IV mag as above Calcium repleted    Latest Ref Rng & Units 10/30/2023    6:11 AM 10/29/2023    6:21 AM 10/28/2023    5:41 AM  BMP  Glucose 70 - 99 mg/dL 956  94  95   BUN 8 - 23 mg/dL 27  25  22    Creatinine 0.61 - 1.24 mg/dL 2.13  0.86  5.78   Sodium 135 - 145 mmol/L 141  139  141   Potassium 3.5 - 5.1 mmol/L 4.0  3.4  3.3   Chloride 98 - 111 mmol/L 111  108  108   CO2 22 - 32 mmol/L 21  20  22   Calcium 8.9 - 10.3 mg/dL 8.4  7.9  8.0   Continue with potassium supplementation, recheck in a.m. 13. HCAP: Treated with 7 day course of Zosyn   - No respiratory distress  -1-30: Chest x-ray today--no acute intrapulmonary  process.  Pleural effusions resolved.  14. Gross hematuria: Has resolved--->to follow up with Dr. Annabell Howells on outpatient basis.   -1-29: High proteinuria and some blood in urinalysis; negative for infection.  Consistent with prior urinalysis  Abdominal pain, abdominal exam is normal, discontinue CT abdomen, would be concerned about worsening renal status with Omnipaque  15. T2DM: Hgb A1c- 5.5. Diet controlled at this time             --has been off actos.  No results for input(s): "GLUCAP" in the last 72 hours.    16. Acute on chronic renal failure -CKD stage IIIb:  BUN/SCr improving. Baseline per OP records appear 1.2-1.5.  --Followed by Dr. Zetta Bills w/plan for renal bx prior to admission.              --encourage fluid intake- Cr 1.33 this AM up from 1.0- will recheck in Ama nd push fluids.   1/27: Continues uptrending to 1.5 today; Is only 240 yesterday; Push PO fluids and give 500 cc bolus today, check in AM  1-28: Creatinine improved to 1.3.  P.o. intakes remain poor, will continue daily monitoring and bolus as needed  1-30: Resume IV fluids today for AKI.  BMP in AM.  1/31: Received 500 cc overnight before pulling IV, creatinine uptrending.  Getting CT abdomen and pelvis today due to concern for renal stone as above.  17. Hx Pancytopenia:  Bone bx w/ ICUS (brewing myelodysplasia) per Dr. Leonides Schanz.    - 1/27: WBC down slightly; monitor--stable    Latest Ref Rng & Units 10/31/2023    6:14 AM 10/30/2023    6:11 AM 10/28/2023    2:20 PM  CBC  WBC 4.0 - 10.5 K/uL 3.4  3.4  6.1   Hemoglobin 13.0 - 17.0 g/dL 8.7  8.7  9.0   Hematocrit 39.0 - 52.0 % 28.3  27.8  28.9   Platelets 150 - 400 K/uL 122  135  208   Mild pan cytopenia stable versus yesterday  18. Abdominal pain/nausea   - KUB without concerning findings 1/27   - Zofran PRN added   - on protonix 40 mg BID--DC'd per family request, changed to famotidine 20 mg resolved 2/1  19.  Bladder incontinence/dysuria.  PVRs next 2 days.  Labs  not concerning for infection.  On Flomax 0.4 mg daily  -1-29: PVRs low.  mixed continence.  Continue timed toileting.  Urinalysis due to endorsing dysuria.--Negative   LOS: 7 days A FACE TO FACE EVALUATION WAS PERFORMED  Erick Colace 10/31/2023, 12:49 PM

## 2023-10-31 NOTE — Progress Notes (Signed)
Occupational Therapy Session Note  Patient Details  Name: Joshua [name] MRN: [id number] Date of Birth: 04-02-1951  {CHL IP REHAB OT TIME CALCULATIONS:[id number]}   Short Term Goals: Week 2:  OT Short Term Goal 1 (Week 2): Pt will stand sink side level for 2 min for grooming with min cues OT Short Term Goal 2 (Week 2): Pt will complete LB self care with min A with cues and AE as needed  Skilled Therapeutic Interventions/Progress Updates:    Patient agreeable to participate in OT session. Reports *** pain level.   Patient participated in skilled OT session focusing on ***. Therapist facilitated/assessed/developed/educated/integrated/elicited *** in order to improve/facilitate/promote    Therapy Documentation Precautions:  Precautions Precautions: Fall Precaution Comments: continous spitting Restrictions Weight Bearing Restrictions Per Provider Order: No  Therapy/Group: Individual Therapy  Limmie Patricia, OTR/L,CBIS  Supplemental OT - [facility] Secure Chat Preferred   10/31/2023, 10:31 PM

## 2023-11-01 LAB — CBC
HCT: 27.7 % — ABNORMAL LOW (ref 39.0–52.0)
Hemoglobin: 8.6 g/dL — ABNORMAL LOW (ref 13.0–17.0)
MCH: 30.6 pg (ref 26.0–34.0)
MCHC: 31 g/dL (ref 30.0–36.0)
MCV: 98.6 fL (ref 80.0–100.0)
Platelets: 120 10*3/uL — ABNORMAL LOW (ref 150–400)
RBC: 2.81 MIL/uL — ABNORMAL LOW (ref 4.22–5.81)
RDW: 19.5 % — ABNORMAL HIGH (ref 11.5–15.5)
WBC: 3.5 10*3/uL — ABNORMAL LOW (ref 4.0–10.5)
nRBC: 0 % (ref 0.0–0.2)

## 2023-11-01 LAB — BASIC METABOLIC PANEL
Anion gap: 6 (ref 5–15)
BUN: 25 mg/dL — ABNORMAL HIGH (ref 8–23)
CO2: 22 mmol/L (ref 22–32)
Calcium: 8.7 mg/dL — ABNORMAL LOW (ref 8.9–10.3)
Chloride: 112 mmol/L — ABNORMAL HIGH (ref 98–111)
Creatinine, Ser: 1.53 mg/dL — ABNORMAL HIGH (ref 0.61–1.24)
GFR, Estimated: 47 mL/min — ABNORMAL LOW (ref >60)
Glucose, Bld: 97 mg/dL (ref 70–99)
Potassium: 4.6 mmol/L (ref 3.5–5.1)
Sodium: 140 mmol/L (ref 135–145)

## 2023-11-01 LAB — MAGNESIUM: Magnesium: 1.7 mg/dL (ref 1.7–2.4)

## 2023-11-01 MED ORDER — IRBESARTAN 75 MG PO TABS
75.0000 mg | ORAL_TABLET | Freq: Every day | ORAL | Status: DC
Start: 2023-11-02 — End: 2023-11-09
  Administered 2023-11-02 – 2023-11-09 (×8): 75 mg via ORAL
  Filled 2023-11-01 (×8): qty 1

## 2023-11-01 MED ORDER — FLUOXETINE HCL 20 MG PO CAPS
20.0000 mg | ORAL_CAPSULE | Freq: Every day | ORAL | Status: DC
  Administered 2023-11-01 – 2023-11-02 (×2): 20 mg via ORAL
  Filled 2023-11-01 (×2): qty 1

## 2023-11-01 NOTE — Progress Notes (Signed)
Physical Therapy Session Note  Patient Details  Name: JEVAUGHN DEGOLLADO MRN: [id number] Date of Birth: 1951/02/15  Today's Date: 11/01/2023 PT Individual Time: 6644-0347 PT Individual Time Calculation (min): 41 min   Short Term Goals: Week 1:  PT Short Term Goal 1 (Week 1): Pt will complete bed mobility with mod assist and min verbal cues PT Short Term Goal 2 (Week 1): Pt will complete transfers with mod assist and LRAD PT Short Term Goal 3 (Week 1): Pt will ambulate 81' with mod assist  Skilled Therapeutic Interventions/Progress Updates:     Pt received seated in [facility] and agrees to therapy. No complaint of pain. WC transport to gym for time management. Pt performs sit to stand with cues fore hand placement and CGA. Pt ambulates 2x175' with brief seated rest break in between bouts. PT provides CGA and cues for upright gaze to improve posture and balance. Following rest break, pt ambulates x400' with RW and similar cues. Pt is noted to fatigue during longer walk, and gait speed significantly slows down toward end of above distance. Pt is very tearful at times during session but does not elaborate on why he is crying, despite PT questions. Pt left seated in WC with alarm intact and all needs within reach.   Therapy Documentation Precautions:  Precautions Precautions: Fall Precaution Comments: continous spitting Restrictions Weight Bearing Restrictions Per Provider Order: No   Therapy/Group: Individual Therapy  Beau Fanny, PT, DPT 11/01/2023, 3:57 PM

## 2023-11-01 NOTE — Progress Notes (Signed)
Speech Language Pathology Daily Session Note  Patient Details  Name: Joshua [name] MRN: [id number] Date of Birth: 04-14-1951  Today's Date: 11/01/2023 SLP Individual Time: 4098-1191 SLP Individual Time Calculation (min): 35 min  Short Term Goals: Week 1: SLP Short Term Goal 1 (Week 1): Patient will orient to date and location using external aids in 4/5 opportunities given mod assist. SLP Short Term Goal 2 (Week 1): Patient will solve basic environmental problems with 75% accuracy given mod assist. SLP Short Term Goal 3 (Week 1): Patient will demonstrate awareness of deficits by stating 2 cognitive and 2 physical deficits given max assist. SLP Short Term Goal 4 (Week 1): Patient will utilize external aids to recall daily events with 75% accuracy given mod assist.  Skilled Therapeutic Interventions: SLP conducted skilled therapy session targeting cognitive retraining goals. Patient seems to present with significantly depleted mood, and though he often follows directions, he does not fully engage during therapy sessions. When asked to express a preference for something environmental, patient often replies with "It doesn't matter what I want, you are going to do whatever you want anyway" despite encouragement from SLP to make choices and express his wants/needs to staff. SLP transported patient to speech therapy office and facilitated calendar management task. Patient followed directions and placed items onto calendar with overall mod assist for accuracy/efficiency. Halfway through task, patient slammed pen onto table and remarked "I'm not playing any of your games anymore, you're trying to make me look crazy." SLP provided education re: purpose of speech therapy and goal of patient returning home, able to participate in ADLs and assist with his daily care. Despite education, patient refused further participation in task. SLP returned patient to room and provided assistance with repositioning and acquiring  desired items. During session, patient oriented to date with max assist and utilized external schedule aid to recall daily events with mod assist. Patient was left in chair with call bell in reach and chair alarm set. SLP will continue to target goals per plan of care.       Pain Pain Assessment Pain Scale: Faces Pain Score: 0-No pain Faces Pain Scale: No hurt  Therapy/Group: Individual Therapy  Jeannie Done, M.A., CCC-SLP  Yetta Barre 11/01/2023, 11:02 AM

## 2023-11-01 NOTE — Progress Notes (Signed)
Physical Therapy Session Note  Patient Details  Name: Joshua [name] MRN: [id number] Date of Birth: 01-May-1951  Today's Date: 11/01/2023 PT Individual Time: 0830-0900 PT Individual Time Calculation (min): 30 min   Short Term Goals: Week 1:  PT Short Term Goal 1 (Week 1): Pt will complete bed mobility with mod assist and min verbal cues PT Short Term Goal 2 (Week 1): Pt will complete transfers with mod assist and LRAD PT Short Term Goal 3 (Week 1): Pt will ambulate 18' with mod assist  Skilled Therapeutic Interventions/Progress Updates:    Session focused on functional transfers with RW, dynamic standing balance during functional reaching task with 1 UE support, and gait training with RW. Pt performed sit > stands throughout session with overall CGA with verbal cues for hand placement but continues to push up with BUE on RW despite cues. Improved weightshift noted today. CGA to close supervision overall for dynamic standing balance activity for cornhole game. Progressed cognitive demand for pt to keep up with simple scoring but minimal engagement with this. Functional gait training around room and in between obstacles ~ 200' with CGA overall - as fatigue increased noted decreased safety with positioning of RW too far in front of patient or more difficulty with obstacle negotiation. Transported back to room with safety belt donned and all needs in reach with pt in TIS. Overall pt did excellent this session!  Therapy Documentation Precautions:  Precautions Precautions: Fall Precaution Comments: continous spitting Restrictions Weight Bearing Restrictions Per Provider Order: No   Pain: Pain Assessment Pain Scale: 0-10 Pain Score: 0-No pain     Therapy/Group: Individual Therapy  [name] Darrol Poke, PT, DPT, CBIS  11/01/2023, 9:04 AM

## 2023-11-01 NOTE — Progress Notes (Signed)
 Occupational Therapy Session Note  Patient Details  Name: Joshua [name] MRN: [id number] Date of Birth: 18-Oct-1950  Today's Date: 11/02/2023 OT Individual Time: 0817-0900 OT Individual Time Calculation (min): 43 min    Short Term Goals: Week 2:  OT Short Term Goal 1 (Week 2): Pt will stand sink side level for 2 min for grooming with min cues OT Short Term Goal 2 (Week 2): Pt will complete LB self care with min A with cues and AE as needed  Skilled Therapeutic Interventions/Progress Updates:    Patient agreeable to participate in OT session. Reports stomach pain/soreness which was ongoing throughout the night. No number provided. Communicated to nursing regarding pain and requested pan meds if able. Nurse will look at medication available.   Patient participated in skilled OT session focusing on ADL re-training, functional transfers, and cognitive skills development.  Pt stated that he needed to use the bathroom although shortly after stated that he was urinating in his brief.  Bed mobility: Pt transitioned from supine to sit with SBA and use of HOB elevated. Physical assist provided to bring RUE across to left side hand rail to utilize for assistance. VC provided for technique. Functional transfers: Pt completed step pvt transfer transfer from bed to [facility] utilizing RW with CGA. VC for proper hand placement with RW management provided prior to sit to stand transitions.  LB bathing: Completed seated and standing with Min A for thoroughness. LB dressing: Pt completed seated then standing with Min A to don a clean brief and pants.  Grooming: completed seated at sink. OT provided set-up of supplies needed. Pt completed oral care with SBA although continues to demonstrate difficulty with task termination requiring VC to complete.   Therapy Documentation Precautions:  Precautions Precautions: Fall Precaution Comments: continous spitting Restrictions Weight Bearing Restrictions Per Provider Order:  No  Therapy/Group: Individual Therapy  Leita Howell, OTR/L,CBIS  Supplemental OT - [facility] Secure Chat Preferred   11/02/2023, 8:21 AM

## 2023-11-01 NOTE — Progress Notes (Signed)
Physical Therapy Session Note  Patient Details  Name: Joshua [name] MRN: [id number] Date of Birth: 1950-10-10  Today's Date: 11/01/2023 PT Individual Time: 1425-1455 PT Individual Time Calculation (min): 30 min   Short Term Goals: Week 1:  PT Short Term Goal 1 (Week 1): Pt will complete bed mobility with mod assist and min verbal cues PT Short Term Goal 2 (Week 1): Pt will complete transfers with mod assist and LRAD PT Short Term Goal 3 (Week 1): Pt will ambulate 25' with mod assist  Skilled Therapeutic Interventions/Progress Updates:    Pt asleep but agreeable to session. Declined going outside this afternoon (we had talked about it this morning and he was open to the idea). Transported to gym for time management. Engaged in functional gait and stair negotiation training for home entry practice. Performed 4 stairs (6" height) with bilateral rails with overall CGA for balance, decreased control on descent noted but still safe. Functional gait in gym with CGA with RW x 50' with cues for RW placement, upright posture, and safety with obstacles. Instructed in TUG (2 trials completed with pt requesting to use bathroom before third trial occurred).   Trial 1 = 22 sec Trial 2 = 20 sec Avg = 21 sec   Returned to room and pt used RW to walk into bathroom with CGA. Multiple sit <> stands, use of urinal, the requesting to take urinal out, and pt stating "I am just not sure what to do" and becoming more frustrated with this PT. Pt did not have BM but had already had incontinent void in brief but then continent void in urinal. Pt just kept stating he needed to sit down and stand back up and stating he wasn't "done" but was not able to have BM or further void. Pt was overall CGA for sit <> stands. At one point pt put urinal in pants and attempted to start walking with RW - redirection needed for safety. Notified RN after end of session of bathroom needs and potential he may call to go again if the urge  returned.   Therapy Documentation Precautions:  Precautions Precautions: Fall Precaution Comments: continous spitting Restrictions Weight Bearing Restrictions Per Provider Order: No  Pain:  No complaints of pain.    Therapy/Group: Individual Therapy  [name] Darrol Poke, PT, DPT, CBIS  11/01/2023, 3:08 PM

## 2023-11-01 NOTE — Plan of Care (Signed)
  Problem: Consults Goal: RH GENERAL PATIENT EDUCATION Description: See Patient Education module for education specifics. Outcome: Progressing   Problem: RH BOWEL ELIMINATION Goal: RH STG MANAGE BOWEL WITH ASSISTANCE Description: STG Manage Bowel with minimal assistance Assistance. Outcome: Progressing   Problem: RH BLADDER ELIMINATION Goal: RH STG MANAGE BLADDER WITH ASSISTANCE Description: STG Manage Bladder With minimal  Assistance Outcome: Progressing   Problem: RH SKIN INTEGRITY Goal: RH STG SKIN FREE OF INFECTION/BREAKDOWN Description: Manage skin infection / breakdown with educational materials with minimal assistance Outcome: Progressing Goal: RH STG MAINTAIN SKIN INTEGRITY WITH ASSISTANCE Description: STG Maintain Skin Integrity With minimal  Assistance. Outcome: Progressing   Problem: RH SAFETY Goal: RH STG ADHERE TO SAFETY PRECAUTIONS W/ASSISTANCE/DEVICE Description: STG Adhere to Safety Precautions With minimal Assistance/Device. Outcome: Progressing   Problem: RH PAIN MANAGEMENT Goal: RH STG PAIN MANAGED AT OR BELOW PT'S PAIN GOAL Description: <4 w/ prns Outcome: Progressing   Problem: RH KNOWLEDGE DEFICIT GENERAL Goal: RH STG INCREASE KNOWLEDGE OF SELF CARE AFTER HOSPITALIZATION Description:  Patient manage  self care at discharge using educational materials with minimal assistance   Outcome: Progressing   Problem: Safety: Goal: Non-violent Restraint(s) Outcome: Progressing

## 2023-11-01 NOTE — Progress Notes (Addendum)
PROGRESS NOTE   Subjective/Complaints:  Patient endorses ongoing lower abdominal pain which is slightly improved, along with no sleep, depression and feeling "I am never going to get out of here".  Discussed feelings of depression regarding hospitalization further, patient is minimally responsive, when asked if he wanted start medication said "I do not care, you are going to do it anyway".  Denies ever having been on a prior SSRI.  No SI/HI.  No events overnight.  Slept 7-8 hours continuously per sleep log. Labs this a.m. improved from prior; creatinine down from 1.8->1.5, BUN stable 25.  Remains pancytopenic but stable. Blood pressure elevated over the last day, 140s to 160s.  Otherwise vital stable. Per nursing record, no complaints of pain over the weekend. Remains incontinent of bowel and bladder, had a large bowel movement this morning.  ROS + Abdominal pain-improving + Chest pain-improving + Insomnia-improving + Depression-ongoing  Objective:   No results found.   Recent Labs    10/31/23 0614 11/01/23 0544  WBC 3.4* 3.5*  HGB 8.7* 8.6*  HCT 28.3* 27.7*  PLT 122* 120*   Recent Labs    10/30/23 0611 11/01/23 0544  NA 141 140  K 4.0 4.6  CL 111 112*  CO2 21* 22  GLUCOSE 100* 97  BUN 27* 25*  CREATININE 1.87* 1.53*  CALCIUM 8.4* 8.7*    Intake/Output Summary (Last 24 hours) at 11/01/2023 0800 Last data filed at 10/31/2023 2250 Gross per 24 hour  Intake 951 ml  Output 250 ml  Net 701 ml        Physical Exam: Vital Signs Blood pressure (!) 162/81, pulse 60, temperature 97.7 F (36.5 C), temperature source Oral, resp. rate 18, height 5' 7.99" (1.727 m), weight 102.7 kg, SpO2 95%.  Physical Exam   PE: Constitution: Appropriate appearance for age. No apparent distress. +Obese.   Resp: No respiratory distress. No accessory muscle usage. on RA and CTAB Cardio: Well perfused appearance.  1+ pitting R>L  peripheral edema. Capillary refill brisk.  Abdomen: Nondistended but protuberant. +BS.  Minimally tender to palpation in lower quadrants.  Unchanged.  Psych: Flat.  Depressed., without lability or agitation.  Denies SI/HI.  Skin: A lot of skin tears on UE"s and bruising Hemosiderin staining b/l calfs  Neuro: AAOx 4, follows all simple commands. Long-term memory is good, intermediate-term appears improving Reflexes intact Sensory exam: revealed normal sensation in all dermatomal regions in BL UE and LE grossly Motor exam: All 4 extremities antigravity and against resistance Coordination: Fine motor coordination was grossly normal.   Cranial nerves II through XII intact. Pupils equal round reactive to light and accommodation, EOMI.  No apparent nystagmus. Unchanged 2-3  Assessment/Plan: 1. Functional deficits which require 3+ hours per day of interdisciplinary therapy in a comprehensive inpatient rehab setting. Physiatrist is providing close team supervision and 24 hour management of active medical problems listed below. Physiatrist and rehab team continue to assess barriers to discharge/monitor patient progress toward functional and medical goals  Care Tool:  Bathing    Body parts bathed by patient: Right arm, Left arm, Chest, Abdomen, Face   Body parts bathed by helper: Front perineal area, Buttocks, Right upper  leg, Left upper leg, Right lower leg, Left lower leg     Bathing assist Assist Level: Maximal Assistance - Patient 24 - 49%     Upper Body Dressing/Undressing Upper body dressing   What is the patient wearing?: Pull over shirt    Upper body assist Assist Level: Minimal Assistance - Patient > 75%    Lower Body Dressing/Undressing Lower body dressing      What is the patient wearing?: Incontinence brief, Pants     Lower body assist Assist for lower body dressing: Maximal Assistance - Patient 25 - 49%     Toileting Toileting    Toileting assist Assist for  toileting: Total Assistance - Patient < 25%     Transfers Chair/bed transfer  Transfers assist  Chair/bed transfer activity did not occur: Safety/medical concerns (unsafe to get up)  Chair/bed transfer assist level: 2 Helpers     Locomotion Ambulation   Ambulation assist   Ambulation activity did not occur: Safety/medical concerns          Walk 10 feet activity   Assist  Walk 10 feet activity did not occur: Safety/medical concerns        Walk 50 feet activity   Assist Walk 50 feet with 2 turns activity did not occur: Safety/medical concerns         Walk 150 feet activity   Assist Walk 150 feet activity did not occur: Safety/medical concerns         Walk 10 feet on uneven surface  activity   Assist Walk 10 feet on uneven surfaces activity did not occur: Safety/medical concerns         Wheelchair     Assist Is the patient using a wheelchair?: Yes Type of Wheelchair: Manual    Wheelchair assist level: Dependent - Patient 0%      Wheelchair 50 feet with 2 turns activity    Assist        Assist Level: Dependent - Patient 0%   Wheelchair 150 feet activity     Assist      Assist Level: Dependent - Patient 0%   Blood pressure (!) 162/81, pulse 60, temperature 97.7 F (36.5 C), temperature source Oral, resp. rate 18, height 5' 7.99" (1.727 m), weight 102.7 kg, SpO2 95%. Medical Problem List and Plan: 1. Functional deficits secondary to debility from GI bleed/delirium             -patient may  shower- cover skin tears             -ELOS/Goals: 12-14 days min A to supervision-2-17 discharge date             - stable to continue CIR   - Status much improved today  2.  Antithrombotics: -DVT/anticoagulation:  Pharmaceutical: Other (comment)-_Pradaxa resumed on 01/21              -antiplatelet therapy: N/A 3. Pain Management: Tylenol prn.  4. Mood/Behavior/Sleep/delirium: LCSW to follow for evaluation and support.               -antipsychotic agents: N/A--did not tolerate Seroquel    --Monitor sleep wake cycle. -Reminded nursing to add sleep log 1-28:  Did poorly with scheduled nightly Seroquel on inpatient due to daytime somnolence.  Add mirtazapine 15 mg nightly, keep melatonin 5 mg as needed. 1-29: Reminded nursing to apply sleep log.  Added as needed trazodone 50 mg. 1-30: Slept intermittently 1 to 2 hours for a total of 4 hours overnight.  start scheduled trazodone 50 mg nightly.  Encourage sleep, wake cycle. Encouraged family to promote compliance with medications and sleep/wake cycle. Did discontinue Protonix per family request, and Atarax as needed due to sedating effects. -Family notes that at baseline, patient does not sleep well at night, naps often during the day 1-31: Sleep log not updated, patient endorsing poor sleep.  Also endorsing significant symptoms of depression with therapies.  increase trazodone to 100 mg--No SI/HI, so will not involve psychiatry at this time. 2/2 sleep disturbed by frequent urination but otherwise no complaints 2-3: Sleep improved per log.  Patient agreeable to starting SSRI, fluoxetine 20 mg daily ordered for activating effects.  5. Neuropsych/cognition: This patient is not capable of making decisions on his own behalf.   - 1-28: Adding telemonitoring, 4 side rails for safety.  Add Zyprexa 5 mg sublingual versus IM as needed for agitation, given patient is spitting most medication.  Restraints as above. --Have not required as needed Zyprexa  1/31: Overall, cognition seems to be improving a little today, with less preoccupation/distraction--continue to improve over the weekend 2-3     6. Skin/Wound Care: Routine pressure relief measures.    -Monitor of skin tears, bruises.   - Cover areas of skin tears with Mepilex dressings to avoid compulsive picking  7. Fluids/Electrolytes/Nutrition: Monitor I/O check CMET in am   - 1/27: See below; 500 cc IVF today, encourage PO, recheck  BMP in AM. Add calcium supplement  -1-28: Labs improved, encourage p.o.'s.  1-30: AKI again today, with poor p.o. intakes.  Initiate IV fluids, repeat BMP in AM.  Mag low, give 4 g IV repletion, repeat in AM.  1/31: Magnesium repleted.  Management of AKI as below.  P.o. intakes adequate  2-3: Magnesium 1.7, within normal limits.  Potassium 4.6.  8. GIB/Duodenal AVM s/p APC:  To continue PPI BID X 4 weeks-->10/26/23   - HgB stable; see #18  -1-30: Family insistent on discontinuation of Protonix due to possible side effects of cognitive deficits/ parkinsonism; understand this is a very unlikely contributor, discussed risks versus benefits with recent GI bleed, they continue to request removal of medication.  -1/31:  Protonix Dced; will start famotidine 20 mg twice daily as alternative. Add CBC in AM, FOBT.  -2-3: Hemoglobin stable.  Large bowel movement this a.m. will DC FOBT.   9. Right hemothorax s/p CT/fibrinolysis: Has been weaned off oxygen             --follow up with Dr. Irena Cords in 2 months w/repeat CT chest   - Follow-up chest x-ray without ongoing findings.  10. Delirium/intermittent movement d/o: Improved 11. PAF/SVT/HTN: HR improved with titrated of coreg to 12.5 mg bid.  -1/29: Continues intermittently refusing medications, heart rate well-controlled, blood pressure rising.  Resume home olmesartan at low-dose 5 mg; substituted for formulary Avapro 37.5 mg daily.  Add hydralazine 10 mg every 8 hours as needed for systolic blood pressure greater than 180, diastolic greater than 110  1-30: Blood pressure remains elevated, but he only just stated taking his medications today.  Family to encourage compliance, BNP today downtrending from prior so unlikely heart failure exacerbation.  2/3 BUN/creatinine improving, is now taking medications consistently, increase Avapro to 75 mg daily to approximate olmesartan 10 mg     11/01/2023    4:19 AM 10/31/2023    7:25 PM 10/31/2023    5:04 PM  Vitals  with BMI  Systolic 162 142 469  Diastolic 81 73 71  Pulse  60 58 51     12.  Hypokalemia/Hypomagnesemia/hypocalcemia: K+ 3.1 this AM- was given KCL- will recheck in AM   - 1/27: K stable/improved; will start Oscal 500 mg BID for Ca 7.8  1-28: K3.3, start 20 mill equivalents potassium twice daily.  Retest later this week.  1-30: Potassium unchanged, resume twice daily repletion.  4 g IV mag as above Calcium repleted  2-3: Potassium repleted.  Continue current regimen.  Magnesium added on to a.m. labs late--resolved  13. HCAP: Treated with 7 day course of Zosyn   - No respiratory distress  -1-30: Chest x-ray today--no acute intrapulmonary process.  Pleural effusions resolved.  14. Gross hematuria: Has resolved--->to follow up with Dr. Annabell Howells on outpatient basis.   -1-29: High proteinuria and some blood in urinalysis; negative for infection.  Consistent with prior urinalysis   15. T2DM: Hgb A1c- 5.5. Diet controlled at this time             --has been off actos.  No results for input(s): "GLUCAP" in the last 72 hours.    16. Acute on chronic renal failure -CKD stage IIIb:  BUN/SCr improving. Baseline per OP records appear 1.2-1.5.  --Followed by Dr. Zetta Bills w/plan for renal bx prior to admission.              --encourage fluid intake- Cr 1.33 this AM up from 1.0- will recheck in Ama nd push fluids.   1/27: Continues uptrending to 1.5 today; Is only 240 yesterday; Push PO fluids and give 500 cc bolus today, check in AM  1-28: Creatinine improved to 1.3.  P.o. intakes remain poor, will continue daily monitoring and bolus as needed  1-30: Resume IV fluids today for AKI.  BMP in AM.  1/31: Received 500 cc overnight before pulling IV, creatinine uptrending.  Getting CT abdomen and pelvis today due to concern for renal stone as above.  2-3: Creatinine improved back to 1.5; increasing ACE inhibitor as above.  17. Hx Pancytopenia:  Bone bx w/ ICUS (brewing myelodysplasia) per Dr. Leonides Schanz.     - 1/27: WBC down slightly; monitor--stable 2-3   18. Abdominal pain/nausea - resolved?   - KUB without concerning findings 1/27   - Zofran PRN added   - on protonix 40 mg BID--DC'd per family request, changed to famotidine 20 mg resolved 2/1  2-1: Abdominal pain, abdominal exam is normal, discontinue CT abdomen, would be concerned about worsening renal status with Omnipaque  19.  Bladder incontinence/dysuria.  PVRs next 2 days.  Labs not concerning for infection.  On Flomax 0.4 mg daily  -1-29: PVRs low.  mixed continence.  Continue timed toileting.  Urinalysis due to endorsing dysuria.--Negative  2/3: Further complaints of pain.  Remains incontinent of bowel and bladder.  Timed toileting.     LOS: 8 days A FACE TO FACE EVALUATION WAS PERFORMED  Angelina Sheriff 11/01/2023, 8:00 AM

## 2023-11-02 ENCOUNTER — Inpatient Hospital Stay (HOSPITAL_COMMUNITY): Payer: Medicare HMO

## 2023-11-02 LAB — URINALYSIS, W/ REFLEX TO CULTURE (INFECTION SUSPECTED): RBC / HPF: >50 RBC/hpf (ref 0–5)

## 2023-11-02 MED ORDER — TRAZODONE HCL 50 MG PO TABS
100.0000 mg | ORAL_TABLET | Freq: Every day | ORAL | Status: DC
Start: 2023-11-02 — End: 2023-11-05
  Administered 2023-11-02 – 2023-11-04 (×3): 100 mg via ORAL
  Filled 2023-11-02 (×3): qty 2

## 2023-11-02 MED ORDER — TRAZODONE HCL 50 MG PO TABS: 50.0000 mg | ORAL_TABLET | Freq: Every day | ORAL | Status: DC

## 2023-11-02 NOTE — Patient Care Conference (Signed)
 Inpatient RehabilitationTeam Conference and Plan of Care Update Date: 11/02/2023   Time: 1046am    Patient Name: Joshua [name]      Medical Record Number: [id number]  Date of Birth: 09-05-1951 Sex: Male         Room/Bed: 4W08C/4W08C-01 Payor Info: Payor: [employer] MEDICARE / Plan: [employer] MEDICARE HMO/PPO / Product Type: *No Product type* /    Admit Date/Time:  10/24/2023  1:08 PM  Primary Diagnosis:  Debility  Hospital Problems: Principal Problem:   Debility Active Problems:   Gastrointestinal hemorrhage with melena    Expected Discharge Date: Expected Discharge Date: 11/09/23  Team Members Present: Physician leading conference: Dr. Joesph Likes Social Worker Present: Graeme Jude, LCSWA Nurse Present: Eulalio Falls, RN;Deborah Fredericka, RN PT Present: Elam Ohara, PT OT Present: Leita Howell, OT SLP Present: Rosina Downy, SLP PPS Coordinator present : Burnard Mealing, OT     Current Status/Progress Goal Weekly Team Focus  Bowel/Bladder   incontinent of b/b LBM 2/3   regain continence   q4 toileting and PRN    Swallow/Nutrition/ Hydration               ADL's   max A LB, min A UB, Min A sit<>stand, Min-Mod A stand pvt with RW or holding grab bars in bathroom   Supervision   family education needed. LB dressing, standing tolerance/activity tolerance    Mobility   CGA overall with RW x 150' but cognition still fluctuating and can be limiting   supervision overall ambulatory; CGA for stairs  barriers: cognitive deficits, endurance; Focus on d/c planning, stairs, fam ed, strength/endurance    Communication                Safety/Cognition/ Behavioral Observations  Patient currently requires max assist for orientation to date, min assist for immediate recall, mod assist for basic problem solving/schedule management tasks. Significantly decreased mood impacts engagement in sessions so true cognitive status is difficulty to ascertain   min assist         Pain   patient has not complained of any stomach pain the past 2 days   continue improving comfort for patient with lesser pain   assess pain qshift and PRN    Skin   several abrasions from skin tears and picking at skin   keep wounds covered and infection free  assess skin and perform wound care daily      Discharge Planning:  Pt will discharge to home with wife as primary caregiver. Pt needs to be able to get up and ambulate on his own. PRN support from his children. SW will confirm there are no barriers to discharge.   Team Discussion: Patient was admitted post GI bleed with debility. Patient's po intake is better as reflected with blood chemistries; MD continues  to check  on a weekly basis. Patient is limited by confusion, fluctuating cognition  and depression; medication adjusted.  Patient 's  blood pressure elevated ; medication adjusted. Abdominal pain has improved but not resolved.  Patient on target to meet rehab goals: yes, Patient requires min assistance with Upper body care and , requires max assistance with Lower body care and min - mod assistance with toilet transfers .Patient was able to walk 150' with CGA using a  RW. Overall goals are set for supervision.  *See Care Plan and progress notes for long and short-term goals.   Revisions to Treatment Plan:  N/a  Teaching Needs: Safety, medications, toileting, transfers, etc.  Current  Barriers to Discharge: Decreased caregiver support, Incontinence, and Behavior  Possible Resolutions to Barriers: Family education Home health follow -up DME: RW    Medical Summary Current Status: Medically complicated by hypertension, heart failure, CKD, depression, hypoactive delirium, incontinence of bowel and bladder, and abdominal pain  Barriers to Discharge: Behavior/Mood;Cardiac Complications;Electrolyte abnormality;Medical stability;Incontinence;Morbid Obesity;Uncontrolled Hypertension;Renal Insufficiency/Failure;Self-care  education;Uncontrolled Pain   Possible Resolutions to [employer]: Delirium precautions and reestablish sleep-wake cycle with medications and daily stimulation, treat depression,  monitor labs for recurrent AKI and stability, adjust medications as needed for blood pressure control, timed toileting and adjusting bowel medications   Continued Need for Acute Rehabilitation Level of Care: The patient requires daily medical management by a physician with specialized training in physical medicine and rehabilitation for the following reasons: Direction of a multidisciplinary physical rehabilitation program to maximize functional independence : Yes Medical management of patient stability for increased activity during participation in an intensive rehabilitation regime.: Yes Analysis of laboratory values and/or radiology reports with any subsequent need for medication adjustment and/or medical intervention. : Yes   I attest that I was present, lead the team conference, and concur with the assessment and plan of the team.   Givanni Staron Gayo 11/02/2023, 3:23 PM

## 2023-11-02 NOTE — Progress Notes (Signed)
 Patient ID: CARLEY STRICKLING, male   DOB: 23-Jun-1951, 73 y.o.   MRN: [id number]  1212- SW spoke with pt wife to provide updates from team conference, inform on d/c date 2/11, fam edu, and d/c recs HHPT/OT/SLP and RW and shower chair with back. No HHA preference. Pt has a RW and SW will order shower chair with back. Fam edu on Thursday 8am-11am. She is going to see if her daughter will come as well.   SW ordered shower chair with back with [employer] via parachute.   SW sent HHPT/OT/SLP/aide/?SN referral to [facility] and waiting on follow-up. *Declined referral as no staffing in area.   SW waiting on follow-up from [facility] about referral.   Graeme Jude, MSW, LCSW Office: [phone] Cell: [phone] Fax: [phone]

## 2023-11-02 NOTE — Progress Notes (Signed)
 Speech Language Pathology Daily Session Note  Patient Details  Name: Joshua [name] MRN: [id number] Date of Birth: [birth year]  Today's Date: 11/02/2023 SLP Individual Time: 1300-1345 SLP Individual Time Calculation (min): 45 min  Short Term Goals: Week 1: SLP Short Term Goal 1 (Week 1): Patient will orient to date and location using external aids in 4/5 opportunities given mod assist. SLP Short Term Goal 2 (Week 1): Patient will solve basic environmental problems with 75% accuracy given mod assist. SLP Short Term Goal 3 (Week 1): Patient will demonstrate awareness of deficits by stating 2 cognitive and 2 physical deficits given max assist. SLP Short Term Goal 4 (Week 1): Patient will utilize external aids to recall daily events with 75% accuracy given mod assist.  Skilled Therapeutic Interventions:   Pt greeted at bedside. He was awake/alert and upright in his wheelchair upon SLP arrival. Flat affect, limited motivation, and limited initiation remain overall. Anticipate depressed mood continues to negatively impact success. He benefited from modA cues for functional problem solving and reasoning during task identifying similar items/odd one out from fo 4. He also completed functional med management task reading med labels and organizing meds w/ maxA visual/verbal cues. He was able to utilize external memory aids to verbalize the date/location with modA verbal cues. Direct handoff completed with PT. Recommend cont ST per POC.   Pain  Pt reported 3-4/10 pain in his stomach. Unable to provide further description. PT notified nursing. See EMR for more information.   Therapy/Group: Individual Therapy  [name] 11/02/2023, 3:33 PM

## 2023-11-02 NOTE — Progress Notes (Signed)
 Physical Therapy Session Note  Patient Details  Name: Joshua [name] MRN: [id number] Date of Birth: September 15, 1951  Today's Date: 11/02/2023 PT Individual Time: 1345-1438 PT Individual Time Calculation (min): 53 min   Short Term Goals: Week 1:  PT Short Term Goal 1 (Week 1): Pt will complete bed mobility with mod assist and min verbal cues PT Short Term Goal 2 (Week 1): Pt will complete transfers with mod assist and LRAD PT Short Term Goal 3 (Week 1): Pt will ambulate 47' with mod assist  Skilled Therapeutic Interventions/Progress Updates:   Received pt sitting in TIS [name] - handoff from SLP. Pt agreeable to PT treatment and reported discomfort in stomach (nauseous and needing to toilet) - RN notified. Session with emphasis on functional mobility/transfers, toileting, generalized strengthening and endurance, and gait training.   Pt stood with RW and CGA and ambulated in/out of bathroom with RW and CGA. Pt required assist for clothing management due to urgency (pt unable to hold urine long enough to get to toilet). Removed soiled brief and pt continue to void minimally in urinal and with small BM. Noted hematuria and pt reported burning sensation while voiding and that urinal cuts him - provided pt with male urinal and notified MD/RN. Pt unable to finish voiding in sitting - stood with RW and CGA and continued to void with CGA for balance (pt able to hold urinal without assist). Donned clean brief in standing with max A and pulled pants over hips with mod A.   Pt performed remainder of transfers with RW and close supervision throughout session. Pt ambulated 166ft x 2 trials with RW and close supervision to/from dayroom. Worked on seated BLE/BUE strengthening on Nustep at workload 3 for 8 minutes for a total of 440 steps with emphasis on cardiovascular endurance. Pt then stood without AD and CGA and ambulated 14ft without AD and min A - pt with generalized unsteadiness and reaching out for external  support for balance. Pt reported feeling worn out and required extensive seated rest break after ambulating without AD. Returned to room and concluded session with pt sitting in TIS WC, needs within reach, and seatbelt alarm on. Pt's pastor present at bedside to visit.   Therapy Documentation Precautions:  Precautions Precautions: Fall Precaution Comments: continous spitting Restrictions Weight Bearing Restrictions Per Provider Order: No  Therapy/Group: Individual Therapy [name], DPT 11/02/2023, 6:58 AM
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
## 2023-11-02 NOTE — Progress Notes (Signed)
 Physical Therapy Session Note  Patient Details  Name: Joshua [name] MRN: [id number] Date of Birth: 1951-08-30  Today's Date: 11/02/2023 PT Individual Time: 0933-1045 PT Individual Time Calculation (min): 72 min   Short Term Goals: Week 1:  PT Short Term Goal 1 (Week 1): Pt will complete bed mobility with mod assist and min verbal cues PT Short Term Goal 1 - Progress (Week 1): Met PT Short Term Goal 2 (Week 1): Pt will complete transfers with mod assist and LRAD PT Short Term Goal 2 - Progress (Week 1): Met PT Short Term Goal 3 (Week 1): Pt will ambulate 74' with mod assist PT Short Term Goal 3 - Progress (Week 1): Met  Skilled Therapeutic Interventions/Progress Updates: Pt presents sitting in TIS and reluctantly agreeable to therapy.  Pt states 10/10 pain in abdomen when they push or roll or juggle me.  Pt wheeled to main gym for time conservation.  Pt required extended rest breaks 2/2 fatigue w/ activity.  Pt performed LE there ex LAQ, marching and abd/add w/ 2# LLE and 2.5# RLE (couldn't find other 2#!).  Pt very flat today, requiring encouragement for participation, w/ rest breaks.  Pt amb multiple trials up to 160' w/ CGA to close supervision and verbal cues for posture and walker management.  Pt tends to amb w/ flexed posture.  Pt returned to room and remained in TIS w/ chair alarm on and all needs in reach.     Therapy Documentation Precautions:  Precautions Precautions: Fall Precaution Comments: continous spitting Restrictions Weight Bearing Restrictions Per Provider Order: No General:   Vital Signs:   Pain:10/10 abdomen. Pain Assessment Pain Scale: 0-10 Pain Score: 4  Pain Type: Acute pain Pain Location: Abdomen Pain Descriptors / Indicators: Burning Pain Frequency: Intermittent Pain Onset: Gradual     Therapy/Group: Individual Therapy  Brave Dack P Corianne Buccellato 11/02/2023, 11:18 AM

## 2023-11-02 NOTE — Progress Notes (Signed)
 Physical Therapy Weekly Progress Note  Patient Details  Name: Joshua [name] MRN: [id number] Date of Birth: 01/07/1951  Beginning of progress report period: October 25, 2023 End of progress report period: November 02, 2023  Patient has met 3 of 3 short term goals.  Pt has made significant progress towards functional goals. Pt currently requires CGA overall for mobility with RW including transfers and gait up to 400'. Pt is still limited by cognitive deficits and emotional lability with pt demonstrating poor memory, initiation, attention, and awareness. Pt would benefit from formal family training prior to DC.  Patient continues to demonstrate the following deficits muscle weakness, decreased cardiorespiratoy endurance, decreased motor planning, decreased initiation, decreased attention, decreased awareness, decreased problem solving, decreased safety awareness, and decreased memory, and decreased standing balance, decreased postural control, and decreased balance strategies and therefore will continue to benefit from skilled PT intervention to increase functional independence with mobility.  Patient progressing toward long term goals..  Continue plan of care.  PT Short Term Goals Week 1:  PT Short Term Goal 1 (Week 1): Pt will complete bed mobility with mod assist and min verbal cues PT Short Term Goal 1 - Progress (Week 1): Met PT Short Term Goal 2 (Week 1): Pt will complete transfers with mod assist and LRAD PT Short Term Goal 2 - Progress (Week 1): Met PT Short Term Goal 3 (Week 1): Pt will ambulate 70' with mod assist PT Short Term Goal 3 - Progress (Week 1): Met Week 2:  PT Short Term Goal 1 (Week 2): STG = LTG due to ELOS  Skilled Therapeutic Interventions/Progress Updates:  Ambulation/gait training;Community reintegration;DME/adaptive equipment instruction;Neuromuscular re-education;Psychosocial support;Stair training;UE/LE Strength taining/ROM;Wheelchair  propulsion/positioning;Balance/vestibular training;Discharge planning;Pain management;Therapeutic Activities;UE/LE Coordination activities;Cognitive remediation/compensation;Disease management/prevention;Functional mobility training;Patient/family education;Therapeutic Exercise;Visual/perceptual remediation/compensation;Splinting/orthotics   Therapy Documentation Precautions:  Precautions Precautions: Fall Precaution Comments: continous spitting Restrictions Weight Bearing Restrictions Per Provider Order: No   Therapy/Group: Individual Therapy  Reche Ohara PT, DPT 11/02/2023, 9:12 AM

## 2023-11-03 DIAGNOSIS — F09 Unspecified mental disorder due to known physiological condition: Secondary | ICD-10-CM

## 2023-11-03 LAB — URINE CULTURE

## 2023-11-03 LAB — PROTIME-INR
INR: 1.6 — ABNORMAL HIGH (ref 0.8–1.2)
Prothrombin Time: 18.8 s — ABNORMAL HIGH (ref 11.4–15.2)

## 2023-11-03 LAB — APTT: aPTT: 53 s — ABNORMAL HIGH (ref 24–36)

## 2023-11-03 MED ORDER — METHOCARBAMOL 500 MG PO TABS
500.0000 mg | ORAL_TABLET | Freq: Three times a day (TID) | ORAL | Status: DC | PRN
  Administered 2023-11-04 – 2023-11-07 (×4): 500 mg via ORAL
  Filled 2023-11-03 (×4): qty 1

## 2023-11-03 NOTE — Progress Notes (Signed)
 Occupational Therapy Session Note  Patient Details  Name: Joshua [name] MRN: [id number] Date of Birth: 1951-02-25  Today's Date: 11/04/2023 OT Individual Time: 1047-1200 OT Individual Time Calculation (min): 73 min    Short Term Goals: Week 2:  OT Short Term Goal 1 (Week 2): Pt will stand sink side level for 2 min for grooming with min cues OT Short Term Goal 2 (Week 2): Pt will complete LB self care with min A with cues and AE as needed  Skilled Therapeutic Interventions/Progress Updates:    Patient agreeable to participate in OT session. Pt did not verbalize any pain during session.   Patient participated in skilled OT session focusing on BUE strengthening, . Therapist and wife educated on BUE strengthening HEP in order to improve overall strength and endurance needed to complete self care tasks.   Pt completed functional step pivot transfer from TIS chair to high low table with CGA while utilizing RW.   Grip strength: R & L both 40#. 10lb decrease from when previously tested.   Pt participated in BUE strengthening utilizing green theraband. HEP established. Handout provided. VC and visual demonstration provided for proper form and technique.  Exercises: seated, BUE, shoulder horizontal adduction/abduction, PNF pattern, IR/er, abduction, elbow extension, 10X, 1 set. Bicep curl, elbow extension, 12X, 1 set.   Hand strengthening task completed due to decreased score bilaterally this date.  - Utilized hand gripper set at 19.6# held vertically and pick up 20 small foam pieces from one side of the table while releasing it in container placed at opposite end. Completed with each hand.   Proximal shoulder strengthening completed utilizing red therapy ball while seated. Chest press, shoulder flexion, circles (right/left), 10X, 1 set.  Pt participated in OT treatment session with wife present observing. Pt did voice being tired although able to participate with several rest breaks provided  during session.   Therapy Documentation Precautions:  Precautions Precautions: Fall Restrictions Weight Bearing Restrictions Per Provider Order: No     Therapy/Group: Individual Therapy  Leita Howell, OTR/L,CBIS  Supplemental OT - [facility] and [facility] Secure Chat Preferred   11/04/2023, 7:13 AM

## 2023-11-03 NOTE — Progress Notes (Signed)
 Physical Therapy Session Note  Patient Details  Name: Joshua [name] MRN: [id number] Date of Birth: 02-12-1951  Today's Date: 11/03/2023 PT Individual Time: 0809-0850 + 1005-1100 PT Individual Time Calculation (min): 41 min + 55 min  Short Term Goals: Week 2:  PT Short Term Goal 1 (Week 2): STG = LTG due to ELOS  Skilled Therapeutic Interventions/Progress Updates:    SESSION 1: Pt presents in room in bed, agreeable to PT. Pt reporting pain in low back and abdomen at this time. Session focused on gait training and therapeutic activities for participation with self care task as well as family education on pt current level of function and anticipated DC needs.  Pt completes bed mobility with supervision to sit EOB. Pt completes donning pants seated EOB with supervision and increased time. Pt stands to pull pants over hips however pt noted to have incontinent brief and requesting to use urinal. Pt with continent void however due to urgency and no urinal within safe reach pt uses brief, remains standing ~4 min holding brief as he would urinal. Therapist replaces brief with max assist pt assist with holding front in place and pt able to manage pants over hips with CGA for standing balance. Pt completes ambulatory transfer to [facility] with CGA.  Pt family arrives and therapist educates on current level of function and mobility/DME needs. Pt transported to day room dependently for time management, MD present to educate pt and family. Pt then ambulates 50', 175', and 250' with RW supervision/CGA with fatigue, pt family educated throughout with pt demonstrating excessive trunk flexion and downward gaze, does not correct with verbal cueing.  Pt completes up/down 4 steps with BHRs with CGA and increased time with step to gait pattern.  Pt returned to room in [facility] dependently for time management and energy conservation. Pt ambulates into bathroom and back to [facility] with pt daughter Joshua [name] who is cleared for transfers,  safety plan updated.  Pt remains seated in WC with all needs within reach, cal light in place and chair alarm donned and activated at end of session.   SESSION 2: Pt presents in room in [facility] with family present, wife joins for session. Session focused on therapeutic exercise and gait training.  Pt transported to day room dependently for time management. Pt ambulates 175' with RW supervision demonstrating excessive forward trunk flexion and downward gaze. Pt ambulates without RW ~75' with pt requiring CGA, demonstrates anterior trunk flexion. Pt reports bilateral knee pain with ambulation.  Pt returns to sitting for seated rest break, then completes therapeutic exercise for BUE/core/BLE strengthening including: - band pull parts red band seated x10 standing x10 - diagonals red band seated x5 standing x5 - rows red band seated x10 standing x10 - sit to stands x10 (increased confusion requires cues and increased time to complete)  Pt completes reaching task for UE/trunk strength and muscular endurance removing x12 clothes pins from basketball net overhead x2 trials with RUE then LUE. Pt then completes attaching clothes pins and pulling them off for 2 minutes in standing with cues for using BUEs for task.  Pt reports feeling as though he's had incontinent BM during session, ambulates to bathroom in day room for therapist to assess, pt does not have BM and ambulates to nustep with RW CGA. Pt completes continuous training on nustep 6 min workload 5 with BUE/BLE.  Pt completes transfer back to [facility] with RW supervision. Pt returns to room and remains seated in [facility] with all needs within  reach, cal light in place and chair alarm donned and activated at end of session.   Therapy Documentation Precautions:  Precautions Precautions: Fall Precaution Comments: continous spitting Restrictions Weight Bearing Restrictions Per Provider Order: No   Therapy/Group: Individual Therapy  Reche Ohara PT,  DPT 11/03/2023, 11:03 AM

## 2023-11-03 NOTE — Progress Notes (Signed)
 PROGRESS NOTE   Subjective/Complaints:  No events overnight.  Ongoing lower abdominal pain. Patient states he was up all night pooping and peeing; no documented Bms.  Wife and daughter provided medical update; all questions answered.   Vitals significant for some mild hypotension yesterday, then hypertension overnight into 160s.  Rate appears improved/stable.  Ongoing incontinence, hematuria overnight, but appearance is improving.  ROS + Abdominal pain/dysuria + Hematuria - improving  Objective:   US  RENAL Result Date: 11/02/2023 CLINICAL DATA:  Hematuria EXAM: RENAL / URINARY TRACT ULTRASOUND COMPLETE COMPARISON:  None Available. FINDINGS: Right Kidney: Renal measurements: 12.0 x 6.3 x 5.2 cm = volume: 204 mL. Echogenicity within normal limits. No mass or hydronephrosis visualized. Left Kidney: Renal measurements: 12.3 x 6.3 x 6.0 cm = volume: 242 mL. Fall 1.5 cm shadowing calculus in the lower renal pelvis. There is a interpolar cyst measuring 2.1 x 1.8 x 1.7 cm. Lower pole cyst measures 9.9 x 5.9 x 10.3 cm. Bladder: Solid-appearing region within the urinary bladder wall measures 1.3 x 1.1 x 1.4 cm. No blood flow demonstrated with color Doppler. Other: None. IMPRESSION: 1. Solid-appearing region within the urinary bladder wall measures 1.3 x 1.1 x 1.4 cm. This could be blood clot or a mass, though no vascularity was demonstrated. Cystoscopy would be helpful to differentiate. 2. Nonobstructing 1.5 cm calculus in the lower left renal pelvis. 3. Left renal cysts. Electronically Signed   By: Franky Stanford M.D.   On: 11/02/2023 20:13     Recent Labs    11/01/23 0544  WBC 3.5*  HGB 8.6*  HCT 27.7*  PLT 120*   Recent Labs    11/01/23 0544  NA 140  K 4.6  CL 112*  CO2 22  GLUCOSE 97  BUN 25*  CREATININE 1.53*  CALCIUM  8.7*    Intake/Output Summary (Last 24 hours) at 11/03/2023 0758 Last data filed at 11/03/2023 0749 Gross  per 24 hour  Intake 696 ml  Output --  Net 696 ml        Physical Exam: Vital Signs Blood pressure (!) 167/79, pulse 63, temperature 98.2 F (36.8 C), resp. rate 16, height 5' 7.99 (1.727 m), weight 102.7 kg, SpO2 97%.  Physical Exam   PE: Constitution: Appropriate appearance for age. No apparent distress. +Obese.  Ambulating in therapy gym.  Resp: No respiratory distress. No accessory muscle usage. on RA and CTAB Cardio: Well perfused appearance.  1+ pitting R>L peripheral edema. Capillary refill brisk.  Abdomen: Nondistended but protuberant. +BS.  Endorses tenderness to palpation in bilateral lower quadrants, without guarding or grimacing. Psych: Flat.  Depressed., without lability or agitation.  Ongoing.  Does seem a little bit brighter today.  Skin: A lot of skin tears on UEs and bruising Hemosiderin staining b/l calfs Left calf wrapped in gauze, - c/d/i   Neuro: AAOx 4, follows all simple commands. Mild cognitive delay, improving Sensory exam: revealed normal sensation in all dermatomal regions in BL UE and LE grossly Motor exam: All 4 extremities antigravity and against resistance Coordination: Fine motor coordination was grossly normal.   Cranial nerves II through XII intact. Gait: Forward flexed with use of RW and gait  belt and CGA level; >100 feet  Assessment/Plan: 1. Functional deficits which require 3+ hours per day of interdisciplinary therapy in a comprehensive inpatient rehab setting. Physiatrist is providing close team supervision and 24 hour management of active medical problems listed below. Physiatrist and rehab team continue to assess barriers to discharge/monitor patient progress toward functional and medical goals  Care Tool:  Bathing    Body parts bathed by patient: Right arm, Left arm, Chest, Abdomen, Face   Body parts bathed by helper: Front perineal area, Buttocks, Right upper leg, Left upper leg, Right lower leg, Left lower leg     Bathing  assist Assist Level: Maximal Assistance - Patient 24 - 49%     Upper Body Dressing/Undressing Upper body dressing   What is the patient wearing?: Pull over shirt    Upper body assist Assist Level: Minimal Assistance - Patient > 75%    Lower Body Dressing/Undressing Lower body dressing      What is the patient wearing?: Incontinence brief, Pants     Lower body assist Assist for lower body dressing: Maximal Assistance - Patient 25 - 49%     Toileting Toileting    Toileting assist Assist for toileting: Maximal Assistance - Patient 25 - 49%     Transfers Chair/bed transfer  Transfers assist  Chair/bed transfer activity did not occur: Safety/medical concerns (unsafe to get up)  Chair/bed transfer assist level: Contact Guard/Touching assist     Locomotion Ambulation   Ambulation assist   Ambulation activity did not occur: Safety/medical concerns  Assist level: Minimal Assistance - Patient > 75% Assistive device: No Device Max distance: 160ft   Walk 10 feet activity   Assist  Walk 10 feet activity did not occur: Safety/medical concerns  Assist level: Minimal Assistance - Patient > 75% Assistive device: No Device   Walk 50 feet activity   Assist Walk 50 feet with 2 turns activity did not occur: Safety/medical concerns  Assist level: Minimal Assistance - Patient > 75% Assistive device: No Device    Walk 150 feet activity   Assist Walk 150 feet activity did not occur: Safety/medical concerns  Assist level: Minimal Assistance - Patient > 75% Assistive device: No Device    Walk 10 feet on uneven surface  activity   Assist Walk 10 feet on uneven surfaces activity did not occur: Safety/medical concerns         Wheelchair     Assist Is the patient using a wheelchair?: Yes Type of Wheelchair: Manual    Wheelchair assist level: Dependent - Patient 0%      Wheelchair 50 feet with 2 turns activity    Assist        Assist Level:  Dependent - Patient 0%   Wheelchair 150 feet activity     Assist      Assist Level: Dependent - Patient 0%   Blood pressure (!) 167/79, pulse 63, temperature 98.2 F (36.8 C), resp. rate 16, height 5' 7.99 (1.727 m), weight 102.7 kg, SpO2 97%. Medical Problem List and Plan: 1. Functional deficits secondary to debility from GI bleed/delirium             -patient may  shower- cover skin tears             -ELOS/Goals: 12-14 days min A to supervision- 2-11 discharge date             - stable to continue CIR  - 2/4: Per OT, more alert and participatory, still confused  at times and overall depressed regarding being here. Per PT, doing Much better, CGA with RW and walking up to 400 feet. CGA for stairs. Cognition is hard to evaluate because he is not engaged.   2.  Antithrombotics: -DVT/anticoagulation:  Pharmaceutical: Other (comment)-_Pradaxa resumed on 01/21              -antiplatelet therapy: N/A 3. Pain Management: Tylenol  prn.  4. Mood/Behavior/Sleep/delirium: LCSW to follow for evaluation and support.              -antipsychotic agents: N/A--did not tolerate Seroquel     --Monitor sleep wake cycle. -Reminded nursing to add sleep log 1-28:  Did poorly with scheduled nightly Seroquel  on inpatient due to daytime somnolence.  Add mirtazapine  15 mg nightly, keep melatonin 5 mg as needed. 1-29: Reminded nursing to apply sleep log.  Added as needed trazodone  50 mg. 1-30: Slept intermittently 1 to 2 hours for a total of 4 hours overnight. start scheduled trazodone  50 mg nightly.  Encourage sleep, wake cycle. Encouraged family to promote compliance with medications and sleep/wake cycle. Did discontinue Protonix  per family request, and Atarax  as needed due to sedating effects. -Family notes that at baseline, patient does not sleep well at night, naps often during the day 1-31: Sleep log not updated, patient endorsing poor sleep.  Also endorsing significant symptoms of depression with  therapies.  increase trazodone  to 100 mg--No SI/HI, so will not involve psychiatry at this time. 2/2 sleep disturbed by frequent urination but otherwise no complaints 2-3: Sleep improved per log.  Patient agreeable to starting SSRI, fluoxetine  20 mg daily ordered for activating effects. 2/4: Tearful with PT yesterday; would benefit from neuropsych consult.  improved sleep with trazodone  100 mg..  Unfortunately, we will need to DC Prozac  due to interaction with Pradaxa  and hemoglobinuria. 2/5: family updated; agree best thing for mood moving forward is engagement with therapies and discharge home as soon as safe  5. Neuropsych/cognition: This patient is not capable of making decisions on his own behalf.   - 1-28: Adding telemonitoring, 4 side rails for safety.  Add Zyprexa  5 mg sublingual versus IM as needed for agitation, given patient is spitting most medication.  Restraints as above. --Have not required as needed Zyprexa   1/31: Overall, cognition seems to be improving a little today, with less preoccupation/distraction--continue to improve over the weekend 2-3    6. Skin/Wound Care: Routine pressure relief measures.    -Monitor of skin tears, bruises.   - Cover areas of skin tears with Mepilex dressings to avoid compulsive picking  2-4: Asked nursing to change left lower extremity bandage and provide new images for chart due to pulling away - looks changed, clean 2/5  7. Fluids/Electrolytes/Nutrition: Monitor I/O check CMET in am   - 1/27: See below; 500 cc IVF today, encourage PO, recheck BMP in AM. Add calcium  supplement  -1-28: Labs improved, encourage p.o.'s.  1-30: AKI again today, with poor p.o. intakes.  Initiate IV fluids, repeat BMP in AM.  Mag low, give 4 g IV repletion, repeat in AM.  1/31: Magnesium  repleted.  Management of AKI as below.  P.o. intakes adequate  2/3: Magnesium  1.7, within normal limits.  Potassium 4.6.  8. GIB/Duodenal AVM s/p APC:  To continue PPI BID X 4  weeks-->10/26/23   - HgB stable; see #18  -1-30: Family insistent on discontinuation of Protonix  due to possible side effects of cognitive deficits/ parkinsonism; understand this is a very unlikely contributor, discussed risks versus  benefits with recent GI bleed, they continue to request removal of medication.  -1/31:  Protonix  Dced; will start famotidine  20 mg twice daily as alternative. Add CBC in AM, FOBT.  -2-3: Hemoglobin stable.  Large bowel movement this a.m. will DC FOBT.    - Stable on current medications  9. Right hemothorax s/p CT/fibrinolysis: Has been weaned off oxygen             --follow up with Dr. Maryalice in 2 months w/repeat CT chest   - Follow-up chest x-ray without ongoing findings.  10. Delirium/intermittent movement d/o: Improved  11. PAF/SVT/HTN: HR improved with titrated of coreg  to 12.5 mg bid.  -1/29: Continues intermittently refusing medications, heart rate well-controlled, blood pressure rising.  Resume home olmesartan  at low-dose 5 mg; substituted for formulary Avapro  37.5 mg daily.  Add hydralazine  10 mg every 8 hours as needed for systolic blood pressure greater than 180, diastolic greater than 110  1-30: Blood pressure remains elevated, but he only just stated taking his medications today.  Family to encourage compliance, BNP today downtrending from prior so unlikely heart failure exacerbation.  2/3 BUN/creatinine improving, is now taking medications consistently, increase Avapro  to 75 mg daily to approximate olmesartan  10 mg - start 2/4  2/5: Some mild hypotension yesterday, hypertension overnight, will monitor today for any symptomatic orthostasis.     11/03/2023    5:49 AM 11/02/2023    7:43 PM 11/02/2023    1:03 PM  Vitals with BMI  Systolic 167 113 887  Diastolic 79 59 54  Pulse 63  55     12.  Hypokalemia/Hypomagnesemia/hypocalcemia: K+ 3.1 this AM- was given KCL- will recheck in AM   - 1/27: K stable/improved; will start Oscal 500 mg BID for Ca  7.8  1-28: K3.3, start 20 mill equivalents potassium twice daily.  Retest later this week.  1-30: Potassium unchanged, resume twice daily repletion.  4 g IV mag as above Calcium  repleted  2-3: Potassium repleted.  Continue current regimen.  Magnesium  added on to a.m. labs late--resolved  13. HCAP: Treated with 7 day course of Zosyn    - No respiratory distress  -1-30: Chest x-ray today--no acute intrapulmonary process.  Pleural effusions resolved.  14. Gross hematuria/dysuria: Has resolved--->to follow up with Dr. Watt on outpatient basis.   -1-29: High proteinuria and some blood in urinalysis; negative for infection.  Consistent with prior urinalysis  2-4: Recurrent.  Was micro hemoglobinuria on 2-1, CT abdomen and pelvis was canceled as below.  Will get new urinalysis/culture today, renal ultrasound to evaluate, DC Prozac  as above due to potential bleed risk with Pradaxa .Patient reporting possible urethral injury while inpatient contributing.   2/5: Renal ultrasound yesterday showing solid region within the bladder 1.3 x 1.1 x 1.4 cm; blood clot versus mass without positive Doppler.  Also found nonobstructing calculus 1.5 cm in left lower renal pelvis, along with left renal cysts.  Hematuria improving with discontinuation of Prozac ; can continue conservative management and follow-up outpatient with urology Dr. Butler as previously planned by internal medicine.   - Unfortunately, cannot initiate Pyridium due to CKD, will add Robaxin  500 mg every 8 hours as needed.  15. T2DM: Hgb A1c- 5.5. Diet controlled at this time             --has been off actos .   16. Acute on chronic renal failure -CKD stage IIIb:  BUN/SCr improving. Baseline per OP records appear 1.2-1.5.  --Followed by Dr. Gordy Blanch w/plan for renal  bx prior to admission.              --encourage fluid intake- Cr 1.33 this AM up from 1.0- will recheck in Ama nd push fluids.   1/27: Continues uptrending to 1.5 today; Is only 240  yesterday; Push PO fluids and give 500 cc bolus today, check in AM  1-28: Creatinine improved to 1.3.  P.o. intakes remain poor, will continue daily monitoring and bolus as needed  1-30: Resume IV fluids today for AKI.  BMP in AM.  1/31: Received 500 cc overnight before pulling IV, creatinine uptrending.  Getting CT abdomen and pelvis today due to concern for renal stone as above.  2-3: Creatinine improved back to 1.5; increasing ACE inhibitor as above.  17. Hx Pancytopenia:  Bone bx w/ ICUS (brewing myelodysplasia) per Dr. Federico.    - 1/27: WBC down slightly; monitor--stable 2-3  18. Abdominal pain/nausea    - KUB without concerning findings 1/27   - Zofran  PRN added   - on protonix  40 mg BID--DC'd per family request, changed to famotidine  20 mg resolved 2/1  2-1: Abdominal pain, abdominal exam is normal, discontinue CT abdomen, would be concerned about worsening renal status with Omnipaque  - 2/4: multiple Bms overnight, mixed continence - 2-5: Likely due to urethral injury during inpatient cathing as above, likely small clot found in the bladder on ultrasound.  Aqua thermia pad over abdomen, Tylenol , and as needed Robaxin  for pain relief.  Cannot do NSAIDs due to recent GI bleed.  19.  Bladder incontinence/dysuria.  PVRs next 2 days.  Labs not concerning for infection.  On Flomax  0.4 mg daily  -1-29: PVRs low.  mixed continence.  Continue timed toileting.  Urinalysis due to endorsing dysuria.--Negative  2/3: Further complaints of pain.  Remains incontinent of bowel and bladder.  Timed toileting.   See #14   LOS: 10 days A FACE TO FACE EVALUATION WAS PERFORMED  Joshua [name] Likes 11/03/2023, 7:58 AM

## 2023-11-03 NOTE — Progress Notes (Signed)
 Speech Language Pathology Weekly Progress and Session Note  Patient Details  Name: Joshua [name] MRN: [id number] Date of Birth: 20-Nov-1950  Beginning of progress report period: October 26, 2023 End of progress report period: November 03, 2023  Today's Date: 11/03/2023 SLP Individual Time: 1107-1200 SLP Individual Time Calculation (min): 53 min and Today's Date: 11/03/2023 SLP Missed Time: 7 Minutes Missed Time Reason: Patient unwilling to participate;Increased agitation  Short Term Goals: Week 1: SLP Short Term Goal 1 (Week 1): Patient will orient to date and location using external aids in 4/5 opportunities given mod assist. SLP Short Term Goal 1 - Progress (Week 1): Met SLP Short Term Goal 2 (Week 1): Patient will solve basic environmental problems with 75% accuracy given mod assist. SLP Short Term Goal 2 - Progress (Week 1): Met SLP Short Term Goal 3 (Week 1): Patient will demonstrate awareness of deficits by stating 2 cognitive and 2 physical deficits given max assist. SLP Short Term Goal 3 - Progress (Week 1): Discontinued (comment) (discontinued 2* variable participation/behaviors) SLP Short Term Goal 4 (Week 1): Patient will utilize external aids to recall daily events with 75% accuracy given mod assist. SLP Short Term Goal 4 - Progress (Week 1): Not met    New Short Term Goals: Week 2: SLP Short Term Goal 1 (Week 2): STGs = LTGs d/t ELOS  Weekly Progress Updates: Pt demonstrated some progress towards orientation and basic problem solving goals this week, however, minimal progress noted overall d/t variable participation. Depressed mood continues to negatively impact initiation, mental flexibility, participation, and overall success. Pt/family education ongoing at this time. He would benefit from continued ST for remainder of CIR stay to target cognitive deficits, facilitate reduced caregiver burden, and maximize pt independence.   Intensity: Minumum of 1-2 x/day, 30 to 90  minutes Frequency: 3 to 5 out of 7 days Duration/Length of Stay: 2/11 Treatment/Interventions: Cognitive remediation/compensation;Cueing hierarchy;Functional tasks;Therapeutic Activities;Internal/external aids;Patient/family education   Daily Session  Skilled Therapeutic Interventions: Pt, wife, and daughter greeted in the room. He was upright in his wheelchair after PT. SLP provided education to his family re purpose of ST and current progress towards goals. He independently completed a written category organization task (fo 6) @ 95% accuracy despite environmental distractions, such as ST's conversation with family. SLP then facilitated functional time management task. Pt required only s visual/verbal cues for accurate clock reading during 4/6 trials. Initiated subsequent functional calculation task, however, pt required max-DEP cues for calculations. Pt noted to abandon task after 2 trials and education was required to reduce pt agitation. Slightly increased success noted during verbal reasoning tasks (category ID and compare/contrasting use of household items), though pt still required modA verbal cues and frequent encouragement to attempt a response. Pt refused to participate in additional trials/tasks and tx tasks were discontinued. Pt's daughter and spouse verbalized understanding of education and reported no additional questions. Chair alarm set, call light within reach, and pt's family still present upon SLP departure. Recommend cont ST per POC.     Pain  No pain reported.   Therapy/Group: Individual Therapy  [name] 11/03/2023, 1:21 PM

## 2023-11-03 NOTE — Progress Notes (Signed)
 Occupational Therapy Session Note  Patient Details  Name: Joshua [name] MRN: [id number] Date of Birth: 1951/01/18  Today's Date: 11/03/2023 OT Individual Time: 1420-1500 OT Individual Time Calculation (min): 40 min    Short Term Goals: Week 1:  OT Short Term Goal 1 (Week 1): Pt will stand sink side level for 2 min for grooming with min cues OT Short Term Goal 1 - Progress (Week 1): Progressing toward goal OT Short Term Goal 2 (Week 1): Pt will complete LB self care with min A with cues and AE as needed OT Short Term Goal 2 - Progress (Week 1): Progressing toward goal OT Short Term Goal 3 (Week 1): Pt will transfer to and from TTB and toilet with grab bars with min A and min cues OT Short Term Goal 3 - Progress (Week 1): Met  Skilled Therapeutic Interventions/Progress Updates:    1:1 Pt received in the w/c. Pt ambulated to the bathroom with close supervision with RW and performed toileting with mod A (for hygiene assistance after BM and to clean up after soiled brief). Pt was able to void a firm hard stool and some urine- reported to nursing. Pt was able to maintain his balance to be able to perform clothing management. Pt ambulated ot the sink to wash hands. Then sat and shaved himself with setup of tools. Pt transferred back to the bed with RW with supervision and min A to get his legs back into bed to rest. Pt left resting in the bed with call bell in place.   Therapy Documentation Precautions:  Precautions Precautions: Fall Precaution Comments: continous spitting Restrictions Weight Bearing Restrictions Per Provider Order: No General:   Vital Signs: Therapy Vitals Temp: 97.9 F (36.6 C) Pulse Rate: 64 Resp: 17 BP: (!) 101/59 Patient Position (if appropriate): Sitting Oxygen Therapy SpO2: 100 % O2 Device: Room Air Pain:  Pt reports he hurts all the time. Provided rest as needed and encouragement   Therapy/Group: Individual Therapy  Claudene Nest [facility] 11/03/2023,  4:12 PM

## 2023-11-03 NOTE — Consult Note (Signed)
 Neuropsychological Consultation Comprehensive Inpatient Rehab   Patient:   Joshua [name]   DOB:   1951/03/18  MR Number:  [id number]  Location:  [facility] [address] [place] [address] Dept: [phone] Loc: [phone]           Date of Service:   11/03/2023  Start Time:   1 PM End Time:   2 PM  Provider/Observer:  Norleen Asa, Psy.D.       Clinical Neuropsychologist       Billing Code/Service: [phone]  Reason for Service:    Joshua [name] is a 73 year old male currently admitted to the comprehensive inpatient rehabilitation unit and referred for neuropsychological consultation during his admission.  During his admission there have been significant episodes of reports of depressive symptoms and previous verbalizations of suicidal ideation.  Patient with past medical history including A-fib, CAD, type 2 diabetes, hypertension, gout, chronic low blood pressure, diverticulitis, chronic kidney disease, elevated PSA.  Patient was admitted recently to [facility] on 09/24/2023 after reports of progressive weakness, shortness of breath and dizziness.  Workup revealed hyponatremia and acute renal failure and developed hypoxia with increased oxygen needs.  Patient had significant medical interventions noted below in his HPI.  During his acute medical illness patient had episodes of delirium with agitation and at times required restraints for safety.  There was also palliative care consultation to discuss goals of care and family declined DNR.  Patient had worsening of delirium at 1 point with sitting, refusal to take meds and other agitated symptoms.  PT and showed age related atrophy with no acute changes.  There was continued fluctuation of cognitive status with noted significant deconditioning and patient was ultimately transferred to CIR due to functional decline although the patient has been independent  prior to admission there were some pre-existing motor deficits and coordination issues.  During today's clinical interview/visit the patient was present along with his wife and daughter.  Patient was staring down leaning over to the table from recliner chair and initially did not respond to the with direct introduction.  Patient's wife encouraged him to look up and engaged.  While the patient did eventually begin to talk some he displayed clear frustration and agitation at times and was quite reluctant to address or answer questions specifically other than that stating I know what he is talking about.  The patient denied any active suicidal ideation.  Patient was aware of it.  Been in the hospital for more than a month and was ready to go home.  Patient made some vague complaints about medical care but for the most part was just brought up and disengaged for the most part.  He was able to express awareness that he was in the hospital but gave little to no indication he was clear on his medical status or situation.  HPI for the current admission:    HPI: Joshua [name] is a 73 year old R handed male with history of A fib/A flutter on Pradaxa , CAD, T2DM, HTN, gout, chronic LBP, diverticulosis, CKD (plans for renal bx), elevated PSA who was admitted to [facility] on 09/24/23 with reports of progressive weakness, SOB and dizziness. Family reported melena X 2 weeks and work up revealed Hyponatremia with Na-128, acute renal failure w/BUN/SCr-126/4.45, Hgb noted to be 7.0 and platelets 148. He was received a liter of bicarb and one unit PRBC.  DOAC held and vitamin K   administered due to reports of penile bleeding 12/28.  GI consulted with plans for EGD but he developed hypoxia with increased oxygen needs and CXR done revealing moderate right loculated pleural effusion.    He underwent thoracocentesis of 150 cc of dark blood fluid by Dr Chi at bedside on 12/31 and CT chest done revealing large right loculated pleural  effusion concerning for clotted hemothorax. Right chest tube placed with removal of clotted 200-300 cc old blood and frequent saline flushes recommended.  He was treated with Vitamin K  for INR- 9.5, praxibind X 2 doses as well as FFP and received additional 2 units PRBC.  EGD performed on 01/02 revealing tow gastric AVMs -one with active intermittent oozing which was treated with APC. Dr. Charlanne recommended PPI bid X 4 weeks and resumption of AC on 01/04. Post procedure did have some blood as well as dark stools.  DOE improved and chest tube pulled and flushed with  minimal improvement.   Repeat Chest CT 01/04 showed complex right pleural gas and fluid collection -hemopneumothorax or empyema with extensive passive atelectasis of lung.  IR consulted and CT guided 14 Fr pigtail thoracostomy catheter placed by Dr. Hughes and patient started on IV Zosyn  for purulent  as well as HT nebs with pulmonary hygiene. Pradaxa  held due to bloody drainage from tube. He had issues with epistasis as well as worsening of delirium with agitation requiring restraints for safety. He required fibrinolysis x 3 of clotted chest tube.  Palliative care consulted to discuss GOC and family decided on DNR. He was weaned down to RA and IV  heparin  stated on 01/15 and chest tube removed on 01/14. As  H/H stable, pradaxa  resumed on 01/21 ,  He had worsening of delirium with spitting, refusal to take meds and tapping of fingers. CT head showed mild age related atrophy and no acute changes. Dr. Shelton consulted due to ongoing issues with delirium and EEG negative for seizures. He did sustain a fall without injuries reported on 01/21. Mentation continues to fluctuate and he is  noted to be deconditioned.  He is requiring +2 min to mod assist for SPT and family has decline SNF. Family requested CIR  due to functional decline. He was independent PTA and furniture walked.   Pt reports his main issue is he needs to have BM immediately and having abd  pain as a result. He denies any other pain-   Medical History:   Past Medical History:  Diagnosis Date   Arthritis    Atrial fibrillation (HCC)    Atrial flutter (HCC)    Back pain    CAD (coronary artery disease)    stents placed 2009   Cholelithiasis    Complication of anesthesia    woke up during surgery and ablation   DM (diabetes mellitus) (HCC)    type 2   Elevated PSA    Gout    Hearing problem    HTN (hypertension)    Hyperlipidemia    Myocardial infarct, old 2010   Prostatitis    Renal insufficiency          Patient Active Problem List   Diagnosis Date Noted   Debility 10/24/2023   Acute respiratory failure with hypoxia (HCC) 10/20/2023   Gross hematuria 10/20/2023   Morbid obesity (HCC) 10/20/2023   Coagulopathy (HCC) 10/20/2023   Hypokalemia 10/20/2023   Medication management 10/16/2023   Odynophagia 10/15/2023   Need for emotional support 10/13/2023   Palliative care encounter 10/12/2023  Goals of care, counseling/discussion 10/12/2023   Counseling and coordination of care 10/12/2023   Pleural effusion 10/10/2023   Hemothorax on right 10/01/2023   Hemothorax 09/30/2023   Pneumothorax 09/30/2023   Symptomatic anemia 09/24/2023   Gastrointestinal hemorrhage with melena 09/24/2023   Hyperglycemia 09/24/2023   Weight loss 09/24/2023   Thrombocytopenia (HCC) 09/24/2023   Hyponatremia 09/24/2023   AKI (acute kidney injury) (HCC) 09/24/2023   CKD stage 3a, GFR 45-59 ml/min (HCC) 09/24/2023   S/P lumbar laminectomy 04/29/2018   Hearing problem    Chest pain, unspecified 06/23/2016   Abnormal nuclear stress test 09/27/2014   Crescendo angina (HCC) 09/26/2014   Pulmonary hypertension (HCC) 09/26/2014   Internal hemorrhoids 01/19/2014   Diverticulosis of colon without hemorrhage 01/19/2014   Paroxysmal atrial fibrillation (HCC) 04/29/2010   PALPITATIONS 04/21/2010   External hemorrhoids 11/01/2009   HYPERKALEMIA 09/09/2009   Type 2 diabetes mellitus  (HCC) 08/16/2009   Hyperlipidemia 08/16/2009   Essential hypertension 08/16/2009   Coronary artery disease with chronic angina 08/16/2009   Disorder resulting from impaired renal function 08/16/2009    Behavioral Observation/Mental Status:   Joshua [name]  presents as a 73 y.o.-year-old Right handed Caucasian Male who appeared his stated age. his dress was Appropriate and he was Well Groomed and his manners were Appropriate, inappropriate to the situation.  his participation was indicative of Inattentive behaviors.  There were physical disabilities noted.  he displayed an inappropriate level of cooperation and motivation.    Interactions:    Minimal Inattentive and Resistant  Attention:   abnormal and attention span appeared shorter than expected for age  Memory:   abnormal; global memory impairment noted  Visuo-spatial:   not examined  Speech (Volume):  normal  Speech:   normal; normal  Thought Process:  Circumstantial  Concrete and Coherent  Though Content:  WNL; not suicidal and not homicidal  Orientation:   person and place  Judgment:   Poor  Planning:   Poor  Affect:    Blunted, Irritable, and Resistant  Mood:    Dysphoric  Insight:   Shallow  Intelligence:   normal  Psychiatric History:  No prior psychiatric history noted.  Patient has been taking Ativan  while on the unit due to agitation and that her prescription for trazodone  at night for sleep.  Patient did report great difficulty sleeping at night.  Family Med/Psych History:  Family History  Problem Relation Age of Onset   Hypertension Mother    Gout Mother    Alzheimer's disease Mother    Heart failure Father    Heart disease Brother    Cancer Brother        type unknown    Risk of Suicide/Violence: low patient currently denies any suicidal ideation and was not openly admitting to previous statements around these issues.  Wife and daughter avoided addressing this issue  directly.  Impression/DX:   Joshua [name] is a 73 year old male currently admitted to the comprehensive inpatient rehabilitation unit and referred for neuropsychological consultation during his admission.  During his admission there have been significant episodes of reports of depressive symptoms and previous verbalizations of suicidal ideation.  Patient with past medical history including A-fib, CAD, type 2 diabetes, hypertension, gout, chronic low blood pressure, diverticulitis, chronic kidney disease, elevated PSA.  Patient was admitted recently to [facility] on 09/24/2023 after reports of progressive weakness, shortness of breath and dizziness.  Workup revealed hyponatremia and acute renal failure and developed hypoxia with increased oxygen  needs.  Patient had significant medical interventions noted below in his HPI.  During his acute medical illness patient had episodes of delirium with agitation and at times required restraints for safety.  There was also palliative care consultation to discuss goals of care and family declined DNR.  Patient had worsening of delirium at 1 point with sitting, refusal to take meds and other agitated symptoms.  PT and showed age related atrophy with no acute changes.  There was continued fluctuation of cognitive status with noted significant deconditioning and patient was ultimately transferred to CIR due to functional decline although the patient has been independent prior to admission there were some pre-existing motor deficits and coordination issues.  During today's clinical interview/visit the patient was present along with his wife and daughter.  Patient was staring down leaning over to the table from recliner chair and initially did not respond to the with direct introduction.  Patient's wife encouraged him to look up and engaged.  While the patient did eventually begin to talk some he displayed clear frustration and agitation at times and was quite  reluctant to address or answer questions specifically other than that stating I know what he is talking about.  The patient denied any active suicidal ideation.  Patient was aware of it.  Been in the hospital for more than a month and was ready to go home.  Patient made some vague complaints about medical care but for the most part was just brought up and disengaged for the most part.  He was able to express awareness that he was in the hospital but gave little to no indication he was clear on his medical status or situation.  Disposition/Plan:  It was very difficult to get a lot of information from the patient because he was quite resistant to any in-depth discussions etc.  While the patient did not indicate any current or active suicidal ideation he was also quite resistant to discussion at all.          Electronically Signed   _______________________ Norleen Asa, Psy.D. Clinical Neuropsychologist

## 2023-11-04 LAB — CBC
HCT: 27.6 % — ABNORMAL LOW (ref 39.0–52.0)
Hemoglobin: 8.5 g/dL — ABNORMAL LOW (ref 13.0–17.0)
MCH: 30.6 pg (ref 26.0–34.0)
MCHC: 30.8 g/dL (ref 30.0–36.0)
MCV: 99.3 fL (ref 80.0–100.0)
Platelets: 115 10*3/uL — ABNORMAL LOW (ref 150–400)
RBC: 2.78 MIL/uL — ABNORMAL LOW (ref 4.22–5.81)
RDW: 20.3 % — ABNORMAL HIGH (ref 11.5–15.5)
WBC: 3.3 10*3/uL — ABNORMAL LOW (ref 4.0–10.5)
nRBC: 0 % (ref 0.0–0.2)

## 2023-11-04 LAB — BASIC METABOLIC PANEL
Anion gap: 12 (ref 5–15)
BUN: 27 mg/dL — ABNORMAL HIGH (ref 8–23)
CO2: 22 mmol/L (ref 22–32)
Calcium: 9 mg/dL (ref 8.9–10.3)
Chloride: 106 mmol/L (ref 98–111)
Creatinine, Ser: 1.69 mg/dL — ABNORMAL HIGH (ref 0.61–1.24)
GFR, Estimated: 42 mL/min — ABNORMAL LOW (ref >60)
Glucose, Bld: 93 mg/dL (ref 70–99)
Potassium: 4.4 mmol/L (ref 3.5–5.1)
Sodium: 140 mmol/L (ref 135–145)

## 2023-11-04 NOTE — Progress Notes (Signed)
 Speech Language Pathology Daily Session Note  Patient Details  Name: Joshua [name] MRN: [id number] Date of Birth: 1951-07-28  Today's Date: 11/04/2023 SLP Individual Time: 8554-8471 SLP Individual Time Calculation (min): 43 min  Short Term Goals: Week 2: SLP Short Term Goal 1 (Week 2): STGs = LTGs d/t ELOS  Skilled Therapeutic Interventions: SLP conducted skilled therapy session targeting cognitive retraining goals. Patient participated well across therapy tasks but continues to appear disengaged. Facilitated card game task. Stated basic game rules at beginning of session. Patient benefited from mod reinforcement of rules with each turn to make accurate decisions and carryout rules correctly. SLP and patient discussed patient's daily activities. Patient recalled have 2 therapy sessions earlier in the day but required mod assist to recall specific details about each session. Patient recalls discharge day with 100% accuracy with supervision and use of external calendar aid. Patient was left in chair with call bell in reach and chair alarm set. SLP will continue to target goals per plan of care.       Pain Pain Assessment Pain Scale: 0-10 Pain Score: 10-Worst pain ever Pain Location: Abdomen Pain Intervention(s): Repositioned;RN made aware  Therapy/Group: Individual Therapy  Rosina Downy, M.A., CCC-SLP  Wynnie Pacetti A Shanica Castellanos 11/04/2023, 3:43 PM

## 2023-11-04 NOTE — Progress Notes (Signed)
 Patient ID: Joshua [name], male   DOB: [birth year], 73 y.o.   MRN: [id number]  HHPT/OT/SLP/aide referral accepted by [facility].   Norval Been, MSW, LCSW Office: [phone] Cell: [phone] Fax: [phone]

## 2023-11-04 NOTE — Progress Notes (Signed)
 Physical Therapy Session Note  Patient Details  Name: Joshua [name] MRN: [id number] Date of Birth: 1950/10/21  Today's Date: 11/04/2023 PT Individual Time: 0805-0915 PT Individual Time Calculation (min): 70 min   Short Term Goals: Week 2:  PT Short Term Goal 1 (Week 2): STG = LTG due to ELOS  Skilled Therapeutic Interventions/Progress Updates:    Pt presents in room in bed, pt family present at bedside. Pt agreeable to PT but with notable agitation this AM. Pt reporting pain in abdomen and states he was up all night because they were poking and prodding me. Pt overall with increased fatigue and frustration this session. Session focused on therapeutic activities for facilitating participation with self care tasks, gait training, and therapeutic exercise for BUE/core/BLE strengthening to be completed as HEP at DC.  Pt completes bed mobility with supervision. Pt dons pants with set up assist and supervision as pt becomes agitated during donning pants and demonstrating quick movements however no LOB. Pt indicates needing to use restroom, ambulates with RW into bathroom with CGA as pt demonstrating quick movements out of frustration, positions to sit on toilet but requires prompting for doffing pants and brief. Pt sits on toilet for ~1 min then reports he does not feel like he can use the restroom. Pt stands and requires max assist for brief management, pt able to manage pants over hips with CGA for postural stability. Pt then reports that he needs to void urine. Therapist assist for managing pants and urinal placement however pt able to manage urinal during continent void, therapist assist for managing brief and pants for time management.  Pt then ambulates to sink with RW CGA, completes hand hygiene with supervision and cues for sequencing provided by pt daughter. Therapist educates pt daughter and wife on pt requiring cues for sequencing as well as ongoing incontinence to manage at DC with both  verbalizing understanding.  Pt transported dependently to day room for time mmanagement. Pt ambulates 175' with RW CGA with significant decrease in gait speed this session compared to previous session, continues to demonstrate anterior trunk lean and downward gaze, does not correct with verbal cues.  Pt completes one set of the following exercises, provided with rest breaks and pt wife educated on how to assist with exercises at home as well as desired technique and cues to provide including:  Access Code: [email or web address] URL: [email or web address] Date: 11/04/2023 Prepared by: Reche Ohara  Exercises - Standing March with Counter Support  - 1 x daily - 7 x weekly - 1-2 sets - 20 reps - Sit to Stand with Armchair  - 1 x daily - 7 x weekly - 1-2 sets - 10 reps - Standing Knee Flexion with walker support  - 1 x daily - 7 x weekly - 1-2 sets - 10 reps - Mini Squat with Counter Support  - 1 x daily - 7 x weekly - 1-2 sets - 10 reps - Standing Shoulder Horizontal Abduction with Resistance  - 2 x daily - 7 x weekly - 1-2 sets - 10 reps - Seated Shoulder Row with Anchored Resistance  - 2 x daily - 7 x weekly - 1-2 sets - 15 reps  Pt completes final gait trial 175' with RW CGA/supervision, improving gait speed following exercises however continues to demonstrate forward trunk lean and downward gaze.  Pt returns to room and remains seated in [facility] with all needs within reach, cal light in place and chair alarm donned and activated at end of  session. Pt educated on use of heat pack to decrease abdominal and back pain with pt verbalizing understanding but declines at this time. Kpad set up in room for pt to use as needed.  Vitals BP 126/65, HR 58  Therapy Documentation Precautions:  Precautions Precautions: Fall Precaution Comments: continous spitting Restrictions Weight Bearing Restrictions Per Provider Order: No    Therapy/Group: Individual Therapy  Reche Ohara PT,  DPT 11/04/2023, 9:16 AM

## 2023-11-04 NOTE — Plan of Care (Signed)
  Problem: RH BOWEL ELIMINATION Goal: RH STG MANAGE BOWEL WITH ASSISTANCE Description: STG Manage Bowel with minimal assistance Assistance. Outcome: Progressing   Problem: RH BLADDER ELIMINATION Goal: RH STG MANAGE BLADDER WITH ASSISTANCE Description: STG Manage Bladder With minimal  Assistance Outcome: Progressing   Problem: RH SKIN INTEGRITY Goal: RH STG SKIN FREE OF INFECTION/BREAKDOWN Description: Manage skin infection / breakdown with educational materials with minimal assistance Outcome: Progressing Goal: RH STG MAINTAIN SKIN INTEGRITY WITH ASSISTANCE Description: STG Maintain Skin Integrity With minimal  Assistance. Outcome: Progressing   Problem: RH SAFETY Goal: RH STG ADHERE TO SAFETY PRECAUTIONS W/ASSISTANCE/DEVICE Description: STG Adhere to Safety Precautions With minimal Assistance/Device. Outcome: Progressing   Problem: RH PAIN MANAGEMENT Goal: RH STG PAIN MANAGED AT OR BELOW PT'S PAIN GOAL Description: <4 w/ prns Outcome: Progressing

## 2023-11-04 NOTE — Progress Notes (Signed)
 PROGRESS NOTE   Subjective/Complaints:  No events overnight.  Patient complaining of ongoing abdominal pain, otherwise with encouragement will endorse low back pain.  Had just gotten a.m. medications, had not gotten as needed Robaxin .  Continues to endorse peeing and pooping all night, although last bowel movement was not liquid and he has been having single well-formed bowel movements recorded daily.  Continues to endorse frustration, depression, generalized apathy towards rehab and his medical issues.  Sleeping well per sleep log, patient denies this, states he still not sleeping.  ROS + Abdominal pain/dysuria--ongoing + Hematuria - improving + Depression-going  Objective:   US  RENAL Result Date: 11/02/2023 CLINICAL DATA:  Hematuria EXAM: RENAL / URINARY TRACT ULTRASOUND COMPLETE COMPARISON:  None Available. FINDINGS: Right Kidney: Renal measurements: 12.0 x 6.3 x 5.2 cm = volume: 204 mL. Echogenicity within normal limits. No mass or hydronephrosis visualized. Left Kidney: Renal measurements: 12.3 x 6.3 x 6.0 cm = volume: 242 mL. Fall 1.5 cm shadowing calculus in the lower renal pelvis. There is a interpolar cyst measuring 2.1 x 1.8 x 1.7 cm. Lower pole cyst measures 9.9 x 5.9 x 10.3 cm. Bladder: Solid-appearing region within the urinary bladder wall measures 1.3 x 1.1 x 1.4 cm. No blood flow demonstrated with color Doppler. Other: None. IMPRESSION: 1. Solid-appearing region within the urinary bladder wall measures 1.3 x 1.1 x 1.4 cm. This could be blood clot or a mass, though no vascularity was demonstrated. Cystoscopy would be helpful to differentiate. 2. Nonobstructing 1.5 cm calculus in the lower left renal pelvis. 3. Left renal cysts. Electronically Signed   By: Franky Stanford M.D.   On: 11/02/2023 20:13     Recent Labs    11/04/23 0537  WBC 3.3*  HGB 8.5*  HCT 27.6*  PLT 115*   Recent Labs    11/04/23 0537  NA 140  K  4.4  CL 106  CO2 22  GLUCOSE 93  BUN 27*  CREATININE 1.69*  CALCIUM  9.0    Intake/Output Summary (Last 24 hours) at 11/04/2023 1321 Last data filed at 11/04/2023 0927 Gross per 24 hour  Intake 360 ml  Output --  Net 360 ml        Physical Exam: Vital Signs Blood pressure (!) 160/82, pulse 63, temperature 97.7 F (36.5 C), temperature source Oral, resp. rate 16, height 5' 7.99 (1.727 m), weight 102.7 kg, SpO2 96%.  Physical Exam   PE: Constitution: Appropriate appearance for age. No apparent distress. +Obese.  Sitting up in bedside chair. Resp: No respiratory distress. No accessory muscle usage. on RA and CTAB Cardio: Well perfused appearance.  2+ pitting R> 1+ L peripheral edema. Capillary refill brisk.  Abdomen: Nondistended but protuberant. +BS.  TTP bilateral lower quadrants, without guarding or grimacing. Psych: Flat.  Depressed., without lability or agitation.  Ongoing. No SI/HI  Skin: A lot of skin tears on UEs and bruising Hemosiderin staining b/l calfs Left calf wrapped in gauze, - c/d/i   Neuro: AAOx 4, follows all simple commands. Mild cognitive delay, improving Sensory exam: revealed normal sensation in all dermatomal regions in BL UE and LE grossly Motor exam: All 4 extremities antigravity and against resistance  Coordination: Fine motor coordination was grossly normal.   Cranial nerves II through XII intact. Gait: Forward flexed with use of RW and gait belt and CGA level; >100 feet  Assessment/Plan: 1. Functional deficits which require 3+ hours per day of interdisciplinary therapy in a comprehensive inpatient rehab setting. Physiatrist is providing close team supervision and 24 hour management of active medical problems listed below. Physiatrist and rehab team continue to assess barriers to discharge/monitor patient progress toward functional and medical goals  Care Tool:  Bathing    Body parts bathed by patient: Right arm, Left arm, Chest, Abdomen,  Face   Body parts bathed by helper: Front perineal area, Buttocks, Right upper leg, Left upper leg, Right lower leg, Left lower leg     Bathing assist Assist Level: Maximal Assistance - Patient 24 - 49%     Upper Body Dressing/Undressing Upper body dressing   What is the patient wearing?: Pull over shirt    Upper body assist Assist Level: Minimal Assistance - Patient > 75%    Lower Body Dressing/Undressing Lower body dressing      What is the patient wearing?: Incontinence brief, Pants     Lower body assist Assist for lower body dressing: Maximal Assistance - Patient 25 - 49%     Toileting Toileting    Toileting assist Assist for toileting: Maximal Assistance - Patient 25 - 49%     Transfers Chair/bed transfer  Transfers assist  Chair/bed transfer activity did not occur: Safety/medical concerns (unsafe to get up)  Chair/bed transfer assist level: Contact Guard/Touching assist     Locomotion Ambulation   Ambulation assist   Ambulation activity did not occur: Safety/medical concerns  Assist level: Minimal Assistance - Patient > 75% Assistive device: No Device Max distance: 135ft   Walk 10 feet activity   Assist  Walk 10 feet activity did not occur: Safety/medical concerns  Assist level: Minimal Assistance - Patient > 75% Assistive device: No Device   Walk 50 feet activity   Assist Walk 50 feet with 2 turns activity did not occur: Safety/medical concerns  Assist level: Minimal Assistance - Patient > 75% Assistive device: No Device    Walk 150 feet activity   Assist Walk 150 feet activity did not occur: Safety/medical concerns  Assist level: Minimal Assistance - Patient > 75% Assistive device: No Device    Walk 10 feet on uneven surface  activity   Assist Walk 10 feet on uneven surfaces activity did not occur: Safety/medical concerns         Wheelchair     Assist Is the patient using a wheelchair?: Yes Type of Wheelchair:  Manual    Wheelchair assist level: Dependent - Patient 0%      Wheelchair 50 feet with 2 turns activity    Assist        Assist Level: Dependent - Patient 0%   Wheelchair 150 feet activity     Assist      Assist Level: Dependent - Patient 0%   Blood pressure (!) 160/82, pulse 63, temperature 97.7 F (36.5 C), temperature source Oral, resp. rate 16, height 5' 7.99 (1.727 m), weight 102.7 kg, SpO2 96%. Medical Problem List and Plan: 1. Functional deficits secondary to debility from GI bleed/delirium             -patient may  shower- cover skin tears             -ELOS/Goals: 12-14 days min A to supervision- 2-11 discharge date             -  stable to continue CIR  - 2/4: Per OT, more alert and participatory, still confused at times and overall depressed regarding being here. Per PT, doing Much better, CGA with RW and walking up to 400 feet. CGA for stairs. Cognition is hard to evaluate because he is not engaged.   2.  Antithrombotics: -DVT/anticoagulation:  Pharmaceutical: Other (comment)-_Pradaxa resumed on 01/21              -antiplatelet therapy: N/A 3. Pain Management: Tylenol  prn.  4. Mood/Behavior/Sleep/delirium: LCSW to follow for evaluation and support.              -antipsychotic agents: N/A--did not tolerate Seroquel     --Monitor sleep wake cycle. -Reminded nursing to add sleep log 1-28:  Did poorly with scheduled nightly Seroquel  on inpatient due to daytime somnolence.  Add mirtazapine  15 mg nightly, keep melatonin 5 mg as needed. 1-29: Reminded nursing to apply sleep log.  Added as needed trazodone  50 mg. 1-30: Slept intermittently 1 to 2 hours for a total of 4 hours overnight. start scheduled trazodone  50 mg nightly.  Encourage sleep, wake cycle. Encouraged family to promote compliance with medications and sleep/wake cycle. Did discontinue Protonix  per family request, and Atarax  as needed due to sedating effects. -Family notes that at baseline, patient does  not sleep well at night, naps often during the day 1-31: Sleep log not updated, patient endorsing poor sleep.  Also endorsing significant symptoms of depression with therapies.  increase trazodone  to 100 mg--No SI/HI, so will not involve psychiatry at this time. 2/2 sleep disturbed by frequent urination but otherwise no complaints 2-3: Sleep improved per log.  Patient agreeable to starting SSRI, fluoxetine  20 mg daily ordered for activating effects. 2/4: Tearful with PT yesterday; would benefit from neuropsych consult.  improved sleep with trazodone  100 mg..  Unfortunately, we will need to DC Prozac  due to interaction with Pradaxa  and hemoglobinuria. 2/5: family updated; agree best thing for mood moving forward is engagement with therapies and discharge home as soon as safe  5. Neuropsych/cognition: This patient is not capable of making decisions on his own behalf.   - 1-28: Adding telemonitoring, 4 side rails for safety.  Add Zyprexa  5 mg sublingual versus IM as needed for agitation, given patient is spitting most medication.  Restraints as above. --Have not required as needed Zyprexa   1/31: Overall, cognition seems to be improving a little today, with less preoccupation/distraction--continue to improve over the weekend 2-3  2/6: Neuropsych evaluation completed, patient was withdrawn/minimally interactive throughout    6. Skin/Wound Care: Routine pressure relief measures.    -Monitor of skin tears, bruises.   - Cover areas of skin tears with Mepilex dressings to avoid compulsive picking  2-4: Asked nursing to change left lower extremity bandage and provide new images for chart due to pulling away - looks changed, clean 2/5  7. Fluids/Electrolytes/Nutrition: Monitor I/O check CMET in am   - 1/27: See below; 500 cc IVF today, encourage PO, recheck BMP in AM. Add calcium  supplement  -1-28: Labs improved, encourage p.o.'s.  1-30: AKI again today, with poor p.o. intakes.  Initiate IV fluids, repeat  BMP in AM.  Mag low, give 4 g IV repletion, repeat in AM.  1/31: Magnesium  repleted.  Management of AKI as below.  P.o. intakes adequate  2/3: Magnesium  1.7, within normal limits.  Potassium 4.6.  8. GIB/Duodenal AVM s/p APC:  To continue PPI BID X 4 weeks-->10/26/23   - HgB stable; see #18  -1-30:  Family insistent on discontinuation of Protonix  due to possible side effects of cognitive deficits/ parkinsonism; understand this is a very unlikely contributor, discussed risks versus benefits with recent GI bleed, they continue to request removal of medication.  -1/31:  Protonix  Dced; will start famotidine  20 mg twice daily as alternative. Add CBC in AM, FOBT.  -2-3: Hemoglobin stable.  Large bowel movement this a.m. will DC FOBT.    - Stable on current medications  9. Right hemothorax s/p CT/fibrinolysis: Has been weaned off oxygen             --follow up with Dr. Maryalice in 2 months w/repeat CT chest   - Follow-up chest x-ray without ongoing findings.  10. Delirium/intermittent movement d/o: Improved  11. PAF/SVT/HTN: HR improved with titrated of coreg  to 12.5 mg bid.  -1/29: Continues intermittently refusing medications, heart rate well-controlled, blood pressure rising.  Resume home olmesartan  at low-dose 5 mg; substituted for formulary Avapro  37.5 mg daily.  Add hydralazine  10 mg every 8 hours as needed for systolic blood pressure greater than 180, diastolic greater than 110  1-30: Blood pressure remains elevated, but he only just stated taking his medications today.  Family to encourage compliance, BNP today downtrending from prior so unlikely heart failure exacerbation.  2/3 BUN/creatinine improving, is now taking medications consistently, increase Avapro  to 75 mg daily to approximate olmesartan  10 mg - start 2/4  2/5: Some mild hypotension yesterday, hypertension overnight, will monitor today for any symptomatic orthostasis.  2-6: Some increasing peripheral edema, blood pressures are stable.   Will recommend Ace wrapping right lower extremity, due to limitations of left lower extremity due to skin tear.     11/04/2023    5:29 AM 11/03/2023    8:16 PM 11/03/2023    2:26 PM  Vitals with BMI  Systolic 160 133 898  Diastolic 82 65 59  Pulse 63 51 64     12.  Hypokalemia/Hypomagnesemia/hypocalcemia: K+ 3.1 this AM- was given KCL- will recheck in AM   - 1/27: K stable/improved; will start Oscal 500 mg BID for Ca 7.8  1-28: K3.3, start 20 mill equivalents potassium twice daily.  Retest later this week.  1-30: Potassium unchanged, resume twice daily repletion.  4 g IV mag as above Calcium  repleted  2-3: Potassium repleted.  Continue current regimen.  Magnesium  added on to a.m. labs late--resolved  13. HCAP: Treated with 7 day course of Zosyn    - No respiratory distress  -1-30: Chest x-ray today--no acute intrapulmonary process.  Pleural effusions resolved.  14. Gross hematuria/dysuria/incontinence: Has resolved--->to follow up with Dr. Watt on outpatient basis. On flomax  0.4 mg daily.   -1-29: High proteinuria and some blood in urinalysis; negative for infection.  Consistent with prior urinalysis  2-4: Recurrent.  Was micro hemoglobinuria on 2-1, CT abdomen and pelvis was canceled as below.  Will get new urinalysis/culture today, renal ultrasound to evaluate, DC Prozac  as above due to potential bleed risk with Pradaxa .Patient reporting possible urethral injury while inpatient contributing.   2/5: Renal ultrasound yesterday showing solid region within the bladder 1.3 x 1.1 x 1.4 cm; blood clot versus mass without positive Doppler.  Also found nonobstructing calculus 1.5 cm in left lower renal pelvis, along with left renal cysts.  Hematuria improving with discontinuation of Prozac ; can continue conservative management and follow-up outpatient with urology Dr. Butler as previously planned by internal medicine.   - Unfortunately, cannot initiate Pyridium due to CKD, will add Robaxin  500 mg  every  8 hours as needed. 2/6: Low PVRs, improved appearance of urine.  Continue conservative management and outpatient follow-up with Dr. Wrenn as previously planned  15. T2DM: Hgb A1c- 5.5. Diet controlled at this time             --has been off actos .   16. Acute on chronic renal failure -CKD stage IIIb:  BUN/SCr improving. Baseline per OP records appear 1.2-1.5.  --Followed by Dr. Gordy Blanch w/plan for renal bx prior to admission.              --encourage fluid intake- Cr 1.33 this AM up from 1.0- will recheck in Ama nd push fluids.   1/27: Continues uptrending to 1.5 today; Is only 240 yesterday; Push PO fluids and give 500 cc bolus today, check in AM  1-28: Creatinine improved to 1.3.  P.o. intakes remain poor, will continue daily monitoring and bolus as needed  1-30: Resume IV fluids today for AKI.  BMP in AM.  1/31: Received 500 cc overnight before pulling IV, creatinine uptrending.  Getting CT abdomen and pelvis today due to concern for renal stone as above.  2-3: Creatinine improved back to 1.5; increasing ACE inhibitor as above.  17. Hx Pancytopenia:  Bone bx w/ ICUS (brewing myelodysplasia) per Dr. Federico.    - 1/27: WBC down slightly; monitor--stable 2-3  18. Abdominal pain/nausea    - KUB without concerning findings 1/27   - Zofran  PRN added   - on protonix  40 mg BID--DC'd per family request, changed to famotidine  20 mg resolved 2/1  2-1: Abdominal pain, abdominal exam is normal, discontinue CT abdomen, would be concerned about worsening renal status with Omnipaque  - 2/4: multiple Bms overnight, mixed continence - 2-5: Likely due to urethral injury during inpatient cathing as above, likely small clot found in the bladder on ultrasound.  Aqua thermia pad over abdomen, Tylenol , and as needed Robaxin  for pain relief.  Cannot do NSAIDs due to recent GI bleed. 2/6: Ongoing, unchanged.  Offered increase in pain medication, patient is not using current as needed Robaxin , encouraged him  to ask for this and asked nursing to give it today.   LOS: 11 days A FACE TO FACE EVALUATION WAS PERFORMED  Joesph JAYSON Likes 11/04/2023, 1:21 PM

## 2023-11-04 NOTE — Patient Instructions (Signed)
 1) Strengthening: Chest Pull - Resisted   Hold Theraband in front of body with hands about shoulder width a part. Pull band a part and back together slowly. Repeat __10__ times. Complete ___1_ set(s) per session.. Repeat ___1_ session(s) per day.  http://orth.exer.us /926   Copyright  VHI. All rights reserved.   2) PNF Strengthening: Resisted   Standing with resistive band around each hand, bring right arm up and away, thumb back. Repeat _10___ times per set. Do __1__ sets per session. Do __1__ sessions per day.    3) Resisted External Rotation: in Neutral - Bilateral   Sit or stand, tubing in both hands, elbows at sides, bent to 90, forearms forward. Pinch shoulder blades together and rotate forearms out. Keep elbows at sides. Repeat _10___ times per set. Do __1__ sets per session. Do _1___ sessions per day.  http://orth.exer.us /966   Copyright  VHI. All rights reserved.   4) PNF Strengthening: Resisted   Standing, hold resistive band above head. Bring right arm down and out from side. Repeat _10___ times per set. Do __1__ sets per session. Do __1__ sessions per day.  http://orth.exer.us /922   Copyright  VHI. All rights reserved.    ELASTIC BAND TRICEPS EXTENSION - SELF FIXATION  While seated, hold and fixate one end of an elastic band against your chest. Hold the other end with your opposite hand with your elbow bent and arm by your side.   Start by pulling the band downward so that the elbow goes from a bent position to a straightened position as shown. Return to starting position and repeat.  Complete 10 repetitions, 1 set.     Theraband Elbow Flexion  Loop the middle of the band around one foot  With arms straight by side and palms facing forward, hold onto each end of the band Bend arms bringing hands toward shoulders, keeping elbows by your side Return to starting position . Complete 10 repetitions, 1 set.

## 2023-11-05 MED ORDER — CARVEDILOL 6.25 MG PO TABS
6.2500 mg | ORAL_TABLET | Freq: Two times a day (BID) | ORAL | Status: DC
  Administered 2023-11-05 – 2023-11-10 (×10): 6.25 mg via ORAL
  Filled 2023-11-05 (×10): qty 1

## 2023-11-05 MED ORDER — TRAZODONE HCL 50 MG PO TABS
50.0000 mg | ORAL_TABLET | Freq: Every day | ORAL | Status: DC
  Administered 2023-11-05 – 2023-11-09 (×5): 50 mg via ORAL
  Filled 2023-11-05 (×5): qty 1

## 2023-11-05 NOTE — Progress Notes (Signed)
 Occupational Therapy Session Note  Patient Details  Name: Joshua [name] MRN: [id number] Date of Birth: [birth year]  Today's Date: 11/05/2023 OT Individual Time: 0802-0845 1st Session; 1430-1530 2nd Session  OT Individual Time Calculation (min): 43 min, 60 min     Short Term Goals: Week 2:  OT Short Term Goal 1 (Week 2): Pt will stand sink side level for 2 min for grooming with min cues OT Short Term Goal 2 (Week 2): Pt will complete LB self care with min A with cues and AE as needed  Skilled Therapeutic Interventions/Progress Updates:   Session 1: Pt seen for am self care and mobility retraining visit. Pt had not had a shower since CIR admission and offered and declined yesterday with OT. Strongly recommended this am and nursing administering meds with pain in back 5/10 bed level. OT instructed how moist heat of shower an provide tremendous relief and pt continues to adamantly defer until d/c. OT further explained in order to reduce the caregiver burden initially at home, a shower is indicated but pt continued to decline. OT completed EOB sponge bathing including peri and buttocks hygiene with overall mod fading to min A, set up UB bathing, LB dressing pull on brief and pants with mod A with RW for standing. UB with set up and S only. Pt performed SPT to TIS with CGA and min cues. Left chair level with all needs, nurse call button and chair alarm active.   Pain:   5/10 initially, OOB to TIS with pillow prop, meds administered and warm sponge bath pt reported 0/10   Session 2:  Pt up in TIS and open to therapy. OT transported for time mngt to and from ADL apt. OT had pt practice shower seat transfer with RW access in and out of stall shower frame to mirror home set up. Pt able to complete step in and sit with RW support with CGA and min cues. One back in TIS transported to ortho gym for seated UE restorator 6 sets of 2 min forward and backward on L 1 with rest between sets. In room, pt asked  how his bandages would be managed for the shower and OT explained how thay are covered. Pt appeared more interested in trialing an actual shower. OT instructed pt on 3 sets of 10 reps of yellow tband for triceps press and pt able to teach back and verbalize understanding. Pt left in TIS as per request with all needs, nurse call button and chair alarm set.   Pain:   Denies any pain up in TIS and with all activity presented   Therapy Documentation Precautions:  Precautions Precautions: Fall Precaution Comments: continous spitting Restrictions Weight Bearing Restrictions Per Provider Order: No   Therapy/Group: Individual Therapy  Geni CHRISTELLA Andreas 11/05/2023, 7:53 AM

## 2023-11-05 NOTE — Plan of Care (Signed)
  Problem: Consults Goal: RH GENERAL PATIENT EDUCATION Description: See Patient Education module for education specifics. Outcome: Progressing   Problem: RH BOWEL ELIMINATION Goal: RH STG MANAGE BOWEL WITH ASSISTANCE Description: STG Manage Bowel with minimal assistance Assistance. Outcome: Progressing   Problem: RH BLADDER ELIMINATION Goal: RH STG MANAGE BLADDER WITH ASSISTANCE Description: STG Manage Bladder With minimal  Assistance Outcome: Progressing   Problem: RH SKIN INTEGRITY Goal: RH STG SKIN FREE OF INFECTION/BREAKDOWN Description: Manage skin infection / breakdown with educational materials with minimal assistance Outcome: Progressing Goal: RH STG MAINTAIN SKIN INTEGRITY WITH ASSISTANCE Description: STG Maintain Skin Integrity With minimal  Assistance. Outcome: Progressing   Problem: RH SAFETY Goal: RH STG ADHERE TO SAFETY PRECAUTIONS W/ASSISTANCE/DEVICE Description: STG Adhere to Safety Precautions With minimal Assistance/Device. Outcome: Progressing   Problem: RH PAIN MANAGEMENT Goal: RH STG PAIN MANAGED AT OR BELOW PT'S PAIN GOAL Description: <4 w/ prns Outcome: Progressing   Problem: RH KNOWLEDGE DEFICIT GENERAL Goal: RH STG INCREASE KNOWLEDGE OF SELF CARE AFTER HOSPITALIZATION Description:  Patient manage  self care at discharge using educational materials with minimal assistance   Outcome: Progressing   Problem: Safety: Goal: Non-violent Restraint(s) Outcome: Progressing

## 2023-11-05 NOTE — Progress Notes (Signed)
 PROGRESS NOTE   Subjective/Complaints:  No events overnight.  Multiple soft Bms ovenright.  Patient seen sitting in room today, states he is the same, nothing is changed.  Ongoing lower abdominal pain, does say that lower back pain is a little bit better today.  Additionally, endorses being up all night urinating, having difficulty controlling urination.  Does not use Flomax  at home.  He complains of constantly feeling tired, especially in the morning.  Is sleeping well at night.  Eating well, sleeping well per log.  Blood pressure slightly elevated today, heart rate dropping into the 40s overnight.  Low 50s today.  ROS + Abdominal pain/dysuria--ongoing + Hematuria - resolved + Depression-ongoing + Fatigue - ongoing  Objective:   No results found.    Recent Labs    11/04/23 0537  WBC 3.3*  HGB 8.5*  HCT 27.6*  PLT 115*   Recent Labs    11/04/23 0537  NA 140  K 4.4  CL 106  CO2 22  GLUCOSE 93  BUN 27*  CREATININE 1.69*  CALCIUM  9.0    Intake/Output Summary (Last 24 hours) at 11/05/2023 0944 Last data filed at 11/04/2023 1826 Gross per 24 hour  Intake 472 ml  Output --  Net 472 ml        Physical Exam: Vital Signs Blood pressure (!) 153/59, pulse (!) 54, temperature 98.1 F (36.7 C), resp. rate 17, height 5' 7.99 (1.727 m), weight 102.7 kg, SpO2 92%.  Physical Exam   PE: Constitution: Appropriate appearance for age. No apparent distress. +Obese.  Sitting up in wheelchair. Resp: No respiratory distress. No accessory muscle usage. on RA and CTAB Cardio: Well perfused appearance.  2+ pitting R> 1+ L peripheral edema. --Unchanged Abdomen: Nondistended but protuberant. +BS. Mild TTP bilateral lower quadrants, without guarding or grimacing. Psych: Flat.  Depressed., without lability or agitation.  Ongoing. No SI/HI  Skin: A lot of skin tears on UEs and bruising Hemosiderin staining b/l  calfs Left calf wrapped in gauze, - c/d/i   Neuro: AAOx 4, follows all simple commands.  No further cognitive delay. Sensory exam: revealed normal sensation in all dermatomal regions in BL UE and LE grossly Motor exam: All 4 extremities antigravity and against resistance Coordination: Fine motor coordination was grossly normal.   Cranial nerves II through XII intact.  Assessment/Plan: 1. Functional deficits which require 3+ hours per day of interdisciplinary therapy in a comprehensive inpatient rehab setting. Physiatrist is providing close team supervision and 24 hour management of active medical problems listed below. Physiatrist and rehab team continue to assess barriers to discharge/monitor patient progress toward functional and medical goals  Care Tool:  Bathing    Body parts bathed by patient: Right arm, Left arm, Chest, Abdomen, Face   Body parts bathed by helper: Front perineal area, Buttocks, Right upper leg, Left upper leg, Right lower leg, Left lower leg     Bathing assist Assist Level: Maximal Assistance - Patient 24 - 49%     Upper Body Dressing/Undressing Upper body dressing   What is the patient wearing?: Pull over shirt    Upper body assist Assist Level: Minimal Assistance - Patient > 75%  Lower Body Dressing/Undressing Lower body dressing      What is the patient wearing?: Incontinence brief, Pants     Lower body assist Assist for lower body dressing: Maximal Assistance - Patient 25 - 49%     Toileting Toileting    Toileting assist Assist for toileting: Maximal Assistance - Patient 25 - 49%     Transfers Chair/bed transfer  Transfers assist  Chair/bed transfer activity did not occur: Safety/medical concerns (unsafe to get up)  Chair/bed transfer assist level: Contact Guard/Touching assist     Locomotion Ambulation   Ambulation assist   Ambulation activity did not occur: Safety/medical concerns  Assist level: Minimal Assistance -  Patient > 75% Assistive device: No Device Max distance: 166ft   Walk 10 feet activity   Assist  Walk 10 feet activity did not occur: Safety/medical concerns  Assist level: Minimal Assistance - Patient > 75% Assistive device: No Device   Walk 50 feet activity   Assist Walk 50 feet with 2 turns activity did not occur: Safety/medical concerns  Assist level: Minimal Assistance - Patient > 75% Assistive device: No Device    Walk 150 feet activity   Assist Walk 150 feet activity did not occur: Safety/medical concerns  Assist level: Minimal Assistance - Patient > 75% Assistive device: No Device    Walk 10 feet on uneven surface  activity   Assist Walk 10 feet on uneven surfaces activity did not occur: Safety/medical concerns         Wheelchair     Assist Is the patient using a wheelchair?: Yes Type of Wheelchair: Manual    Wheelchair assist level: Dependent - Patient 0%      Wheelchair 50 feet with 2 turns activity    Assist        Assist Level: Dependent - Patient 0%   Wheelchair 150 feet activity     Assist      Assist Level: Dependent - Patient 0%   Blood pressure (!) 153/59, pulse (!) 54, temperature 98.1 F (36.7 C), resp. rate 17, height 5' 7.99 (1.727 m), weight 102.7 kg, SpO2 92%. Medical Problem List and Plan: 1. Functional deficits secondary to debility from GI bleed/delirium             -patient may  shower- cover skin tears             -ELOS/Goals: 12-14 days min A to supervision- 2-11 discharge date             - stable to continue CIR  - 2/4: Per OT, more alert and participatory, still confused at times and overall depressed regarding being here. Per PT, doing Much better, CGA with RW and walking up to 400 feet. CGA for stairs. Cognition is hard to evaluate because he is not engaged.   2.  Antithrombotics: -DVT/anticoagulation:  Pharmaceutical: Other (comment)-_Pradaxa resumed on 01/21              -antiplatelet therapy:  N/A 3. Pain Management: Tylenol  prn.  4. Mood/Behavior/Sleep/delirium: LCSW to follow for evaluation and support.              -antipsychotic agents: N/A--did not tolerate Seroquel     --Monitor sleep wake cycle. -Reminded nursing to add sleep log 1-28:  Did poorly with scheduled nightly Seroquel  on inpatient due to daytime somnolence.  Add mirtazapine  15 mg nightly, keep melatonin 5 mg as needed. 1-29: Reminded nursing to apply sleep log.  Added as needed trazodone  50 mg. 1-30: Slept  intermittently 1 to 2 hours for a total of 4 hours overnight. start scheduled trazodone  50 mg nightly.  Encourage sleep, wake cycle. Encouraged family to promote compliance with medications and sleep/wake cycle. Did discontinue Protonix  per family request, and Atarax  as needed due to sedating effects. -Family notes that at baseline, patient does not sleep well at night, naps often during the day 1-31: Sleep log not updated, patient endorsing poor sleep.  Also endorsing significant symptoms of depression with therapies.  increase trazodone  to 100 mg--No SI/HI, so will not involve psychiatry at this time. 2/2 sleep disturbed by frequent urination but otherwise no complaints 2-3: Sleep improved per log.  Patient agreeable to starting SSRI, fluoxetine  20 mg daily ordered for activating effects. 2/4: Tearful with PT yesterday; would benefit from neuropsych consult.  improved sleep with trazodone  100 mg..  Unfortunately, we will need to DC Prozac  due to interaction with Pradaxa  and hemoglobinuria. 2/5: family updated; agree best thing for mood moving forward is engagement with therapies and discharge home as soon as safe 2-7: Due to complaints of daytime lethargy, reduce trazodone  to 50 mg.  Is sleeping well overnight per sleep log.  5. Neuropsych/cognition: This patient is not capable of making decisions on his own behalf.   - 1-28: Adding telemonitoring, 4 side rails for safety.  Add Zyprexa  5 mg sublingual versus IM as  needed for agitation, given patient is spitting most medication.  Restraints as above. --Have not required as needed Zyprexa   1/31: Overall, cognition seems to be improving a little today, with less preoccupation/distraction--continue to improve over the weekend 2-3  2/6: Neuropsych evaluation completed, patient was withdrawn/minimally interactive throughout--discussed with Dr. Corina 2-7, feel making gains in mood and engagement will be challenging moving forward given patient's goals/personality.    6. Skin/Wound Care: Routine pressure relief measures.    -Monitor of skin tears, bruises.   - Cover areas of skin tears with Mepilex dressings to avoid compulsive picking  2-4: Asked nursing to change left lower extremity bandage and provide new images for chart due to pulling away - looks changed, clean 2/5  7. Fluids/Electrolytes/Nutrition: Monitor I/O check CMET in am   - 1/27: See below; 500 cc IVF today, encourage PO, recheck BMP in AM. Add calcium  supplement  -1-28: Labs improved, encourage p.o.'s.  1-30: AKI again today, with poor p.o. intakes.  Initiate IV fluids, repeat BMP in AM.  Mag low, give 4 g IV repletion, repeat in AM.  1/31: Magnesium  repleted.  Management of AKI as below.  P.o. intakes adequate  2/3: Magnesium  1.7, within normal limits.  Potassium 4.6.  8. GIB/Duodenal AVM s/p APC:  To continue PPI BID X 4 weeks-->10/26/23   - HgB stable; see #18  -1-30: Family insistent on discontinuation of Protonix  due to possible side effects of cognitive deficits/ parkinsonism; understand this is a very unlikely contributor, discussed risks versus benefits with recent GI bleed, they continue to request removal of medication.  -1/31:  Protonix  Dced; will start famotidine  20 mg twice daily as alternative. Add CBC in AM, FOBT.  -2-3: Hemoglobin stable.  Large bowel movement this a.m. will DC FOBT.    - Stable on current medications  9. Right hemothorax s/p CT/fibrinolysis: Has been  weaned off oxygen             --follow up with Dr. Maryalice in 2 months w/repeat CT chest   - Follow-up chest x-ray without ongoing findings.  10. Delirium/intermittent movement d/o: Improved/resolved  11. PAF/SVT/HTN:  HR improved with titrated of coreg  to 12.5 mg bid.  -1/29: Continues intermittently refusing medications, heart rate well-controlled, blood pressure rising.  Resume home olmesartan  at low-dose 5 mg; substituted for formulary Avapro  37.5 mg daily.  Add hydralazine  10 mg every 8 hours as needed for systolic blood pressure greater than 180, diastolic greater than 110  1-30: Blood pressure remains elevated, but he only just stated taking his medications today.  Family to encourage compliance, BNP today downtrending from prior so unlikely heart failure exacerbation.  2/3 BUN/creatinine improving, is now taking medications consistently, increase Avapro  to 75 mg daily to approximate olmesartan  10 mg - start 2/4  2/5: Some mild hypotension yesterday, hypertension overnight, will monitor today for any symptomatic orthostasis.  2-6: Some increasing peripheral edema, blood pressures are stable.  Will recommend Ace wrapping right lower extremity, due to limitations of left lower extremity due to skin tear.  2/7: Reduce metoprolol  to 6.25 BID due to HR 40s overnight. DC flomax  as below.      11/05/2023    6:18 AM 11/04/2023    8:58 PM 11/04/2023    4:04 PM  Vitals with BMI  Systolic 153 130 881  Diastolic 59 60 63  Pulse 54 45 49     12.  Hypokalemia/Hypomagnesemia/hypocalcemia: K+ 3.1 this AM- was given KCL- will recheck in AM   - 1/27: K stable/improved; will start Oscal 500 mg BID for Ca 7.8  1-28: K3.3, start 20 mill equivalents potassium twice daily.  Retest later this week.  1-30: Potassium unchanged, resume twice daily repletion.  4 g IV mag as above Calcium  repleted  2-3: Potassium repleted.  Continue current regimen.  Magnesium  added on to a.m. labs late--resolved  13. HCAP:  Treated with 7 day course of Zosyn    - No respiratory distress  -1-30: Chest x-ray today--no acute intrapulmonary process.  Pleural effusions resolved.  14. Gross hematuria/dysuria/incontinence: to follow up with Dr. Watt on outpatient basis. On flomax  0.4 mg daily.   -1-29: High proteinuria and some blood in urinalysis; negative for infection.  Consistent with prior urinalysis  2-4: Recurrent.  Was micro hemoglobinuria on 2-1, CT abdomen and pelvis was canceled as below.  Will get new urinalysis/culture today, renal ultrasound to evaluate, DC Prozac  as above due to potential bleed risk with Pradaxa .Patient reporting possible urethral injury while inpatient contributing.   2/5: Renal ultrasound yesterday showing solid region within the bladder 1.3 x 1.1 x 1.4 cm; blood clot versus mass without positive Doppler.  Also found nonobstructing calculus 1.5 cm in left lower renal pelvis, along with left renal cysts.  Hematuria improving with discontinuation of Prozac ; can continue conservative management and follow-up outpatient with urology Dr. Butler as previously planned by internal medicine.   - Unfortunately, cannot initiate Pyridium due to CKD, will add Robaxin  500 mg every 8 hours as needed. 2/6: Low PVRs, improved appearance of urine.  Continue conservative management and outpatient follow-up with Dr. Watt as previously planned 2/7: DC flomax  due to ongoing incontinence with low PVRs -monitor PVRs over this weekend to ensure no retention  15. T2DM: Hgb A1c- 5.5. Diet controlled at this time             --has been off actos .   16. Acute on chronic renal failure -CKD stage IIIb:  BUN/SCr improving. Baseline per OP records appear 1.2-1.5.  --Followed by Dr. Gordy Blanch w/plan for renal bx prior to admission.              --  encourage fluid intake- Cr 1.33 this AM up from 1.0- will recheck in Ama nd push fluids.   1/27: Continues uptrending to 1.5 today; Is only 240 yesterday; Push PO fluids and give  500 cc bolus today, check in AM  1-28: Creatinine improved to 1.3.  P.o. intakes remain poor, will continue daily monitoring and bolus as needed  1-30: Resume IV fluids today for AKI.  BMP in AM.  1/31: Received 500 cc overnight before pulling IV, creatinine uptrending.  Getting CT abdomen and pelvis today due to concern for renal stone as above.  2-3: Creatinine improved back to 1.5; increasing ACE inhibitor as above.  2/7: BUN, cr mildly increased, 27/1.69 - encourage PO fluids, recheck Monday  17. Hx Pancytopenia:  Bone bx w/ ICUS (brewing myelodysplasia) per Dr. Federico.    - 1/27: WBC down slightly; monitor--stable 2-3  18. Abdominal pain/nausea    - KUB without concerning findings 1/27   - Zofran  PRN added   - on protonix  40 mg BID--DC'd per family request, changed to famotidine  20 mg resolved 2/1  2-1: Abdominal pain, abdominal exam is normal, discontinue CT abdomen, would be concerned about worsening renal status with Omnipaque  - 2/4: multiple Bms overnight, mixed continence - 2-5: Likely due to urethral injury during inpatient cathing as above, likely small clot found in the bladder on ultrasound.  Aqua thermia pad over abdomen, Tylenol , and as needed Robaxin  for pain relief.  Cannot do NSAIDs due to recent GI bleed. 2/6: Ongoing, unchanged.  Offered increase in pain medication, patient is not using current as needed Robaxin , encouraged him to ask for this and asked nursing to give it today.    LOS: 12 days A FACE TO FACE EVALUATION WAS PERFORMED  Joesph JAYSON Likes 11/05/2023, 9:44 AM

## 2023-11-05 NOTE — Progress Notes (Signed)
 Physical Therapy Session Note  Patient Details  Name: Joshua [name] MRN: [id number] Date of Birth: [birth year]  Today's Date: 11/05/2023 PT Individual Time: 8953-8873 PT Individual Time Calculation (min): 40 min   Short Term Goals: Week 1:  PT Short Term Goal 1 (Week 1): Pt will complete bed mobility with mod assist and min verbal cues PT Short Term Goal 1 - Progress (Week 1): Met PT Short Term Goal 2 (Week 1): Pt will complete transfers with mod assist and LRAD PT Short Term Goal 2 - Progress (Week 1): Met PT Short Term Goal 3 (Week 1): Pt will ambulate 30' with mod assist PT Short Term Goal 3 - Progress (Week 1): Met Week 2:  PT Short Term Goal 1 (Week 2): STG = LTG due to ELOS  Skilled Therapeutic Interventions/Progress Updates:   Received pt sitting in TIS WC, pt agreeable to PT treatment but with flat affect, and reported discomfort in stomach. Pt reported need to toilet but stated no one would take me. Session with emphasis on functional mobility/transfers, toileting, generalized strengthening and endurance, and gait training. MD arrived for morning rounds; noted edema in BLE and donned knee high teds dependently. Then pt stood with RW and CGA (initial posterior LOB) and ambulated in/out of bathroom with RW and CGA. Pt able to remove clothing in standing with CGA for balance and able to void and have BM while seated on toilet (see flowsheets) - pt reported feeling better afterwards. Required min A to stand from regular commode and dependent for hygiene management with max A to pull brief/pants over hips. Took seated rest break in TIS WC, then stood with RW and CGA/close supervision and ambulated additional 122ft x 2 trials with RW and CGA to/from room with 1 seated rest/water  break. Returned to room and concluded session with pt sitting in TIS WC, needs within reach, and seatbelt alarm on. Pt appeared in better spirits at end of session.   Therapy Documentation Precautions:   Precautions Precautions: Fall Precaution Comments: continous spitting Restrictions Weight Bearing Restrictions Per Provider Order: No  Therapy/Group: Individual Therapy [name], DPT 11/05/2023, 7:07 AM

## 2023-11-05 NOTE — Progress Notes (Signed)
 Speech Language Pathology Daily Session Note  Patient Details  Name: Joshua [name] MRN: [id number] Date of Birth: [birth year]  Today's Date: 11/05/2023 SLP Individual Time: 1400-1430 SLP Individual Time Calculation (min): 30 min  Short Term Goals: Week 2: SLP Short Term Goal 1 (Week 2): STGs = LTGs d/t ELOS  Skilled Therapeutic Interventions:   Pt greeted in his room. He was awake/alert in his wheelchair upon SLP arrival. Given additional time for processing, fair participation was noted throughout tx tasks. He benefited from [employer] cues for organization, reasoning, and problem solving throughout verbal problem solving task. Pt noted to become emotional re his current physical status, so education and verbal encouragement were provided for final 8-10 mins of tx session. He responded well to education, though remained tearful upon SLP departure. He was left in his chair with his chair alarm set and call light within reach. Recommned cont ST per POC.   Pain  No pain reported at rest.   Therapy/Group: Individual Therapy  [name] 11/05/2023, 2:59 PM

## 2023-11-05 NOTE — Progress Notes (Signed)
 Physical Therapy Session Note  Patient Details  Name: Joshua [name] MRN: [id number] Date of Birth: 18-Jun-1951  Today's Date: 11/05/2023 PT Individual Time: 1305-1400 PT Individual Time Calculation (min): 55 min   Short Term Goals: Week 2:  PT Short Term Goal 1 (Week 2): STG = LTG due to ELOS  Skilled Therapeutic Interventions/Progress Updates:    Pt presents in room in [facility], agreeable to PT, reports having had incontinent episode and needing to change pants. Pt continues to report pain in lower abdomen. Session focused on therapeutic activities for participation with self care tasks, gait training with and without device, and NMR for single limb stability and dynamic standing balance. Pt completes transfers with RW supervision, wihtout RW CGA throughout session.  Pt ambulates to bathroom with supervision with RW and completes jtoilet transfer with cues for positioning prior to sitting as well as cues for managing pants prior to sitting. Therapist assist with threading pants and managing brief for time management however pt able to manage pants over hips. Pt ambulates back to [facility] with supervision and RW.  Pt transported to day room dependently for time management. Pt ambulates with RW supervision 175' with pt demonstrating improved gait speed from previous session.  Pt ambulates without RW 80' CGA, COG anterior to BOS, decreased stride length.  Pt completes forward/backward walking 3x10', side stepping 3x10' bilaterally CGA (decreased step length).  Pt completes NMR for single limb stability, dynamic standing balance, multidirectional stepping stability for stepping strategies including: - step taps 4 step x20 alternating BLE - forward step x10 BLE - lateral step x10 BLE - backwards step x10 BLE  Pt ambulates without device CGA/min assist 175' demonstrating anterior trunk lean but improved COG within BOS. Pt demonstrates decreased stride length and gait speed.  Pt provided with seated rest  breaks between all gait trials and exercises to promote energy conservation and quality with tasks.  Pt returns to room and remains seated in [facility] with all needs within reach, cal light in place and chair alarm donned and activated at end of session   Therapy Documentation Precautions:  Precautions Precautions: Fall Precaution Comments: continous spitting Restrictions Weight Bearing Restrictions Per Provider Order: No   Therapy/Group: Individual Therapy  Reche Ohara PT, DPT 11/05/2023, 4:40 PM

## 2023-11-06 DIAGNOSIS — R350 Frequency of micturition: Secondary | ICD-10-CM

## 2023-11-06 DIAGNOSIS — R300 Dysuria: Secondary | ICD-10-CM

## 2023-11-06 DIAGNOSIS — N1832 Chronic kidney disease, stage 3b: Secondary | ICD-10-CM

## 2023-11-06 MED ORDER — MIRABEGRON ER 25 MG PO TB24
25.0000 mg | ORAL_TABLET | Freq: Every day | ORAL | Status: DC
  Administered 2023-11-06 – 2023-11-07 (×2): 25 mg via ORAL
  Filled 2023-11-06 (×2): qty 1

## 2023-11-06 MED ORDER — TRAMADOL HCL 50 MG PO TABS
50.0000 mg | ORAL_TABLET | Freq: Three times a day (TID) | ORAL | Status: DC | PRN
  Administered 2023-11-06 – 2023-11-10 (×4): 50 mg via ORAL
  Filled 2023-11-06 (×5): qty 1

## 2023-11-06 NOTE — Progress Notes (Signed)
 Occupational Therapy Session Note  Patient Details  Name: Joshua [name] MRN: [id number] Date of Birth: [birth year]  Today's Date: 11/06/2023 OT Individual Time: 0243-0330 OT Individual Time Calculation (min): 47 min    Short Term Goals: Week 1:  OT Short Term Goal 1 (Week 1): Pt will stand sink side level for 2 min for grooming with min cues OT Short Term Goal 1 - Progress (Week 1): Progressing toward goal OT Short Term Goal 2 (Week 1): Pt will complete LB self care with min A with cues and AE as needed OT Short Term Goal 2 - Progress (Week 1): Progressing toward goal OT Short Term Goal 3 (Week 1): Pt will transfer to and from TTB and toilet with grab bars with min A and min cues OT Short Term Goal 3 - Progress (Week 1): Met  Skilled Therapeutic Interventions/Progress Updates:    Patient in bed at the time of arrival indicating that he has a pain response of a 10 on a 0-10  associated with his  pelvis, nursing was present at the time and complete a bladder scan on the pt. The pt went on to  indicate that he wasn't able to rest last night and wasn't in good spirits. The pt  completed UB exercises using theraband 2 sets of 10 for horizontal abduction and shld flexion.  The pt was able to complete pull ups, and bicep curls , horizontal abduction, and shld flexion, using a 3lb dumb bell 1 sets of 10 with rest breaks as needed, the pt required 1 rest breaks with each exercise. The pt went on to complete cone exercises by retrieving the cones with 1 hand and placing them in the opposite hand of the instructor. As a result of the patient's pain response he requested assistance returning to bed LOF, I  was able to get the pt to sit EOB for 5 minutes in duration before repositioning him  back to bed LOF at [facility] for managing BLE. Prior to leaving the room, call light and bedside table were place within reach with all additional needs addressed.  Therapy Documentation Precautions:  Precautions Precautions:  Fall Precaution Comments: continous spitting Restrictions Weight Bearing Restrictions Per Provider Order: No  Therapy/Group: Individual Therapy  Elvera JONETTA Mace 11/06/2023, 4:43 PM

## 2023-11-06 NOTE — Progress Notes (Signed)
Bladder scan volume = 0ml.

## 2023-11-06 NOTE — Progress Notes (Signed)
 Physical Therapy Session Note  Patient Details  Name: Joshua [name] MRN: [id number] Date of Birth: [birth year]  Today's Date: 11/06/2023 PT Individual Time: 0825-0920 PT Individual Time Calculation (min): 55 min   Short Term Goals: Week 2:  PT Short Term Goal 1 (Week 2): STG = LTG due to ELOS  Skilled Therapeutic Interventions/Progress Updates:    Chart reviewed and pt agreeable to therapy. Pt received semi-reclined in bed with c/o soreness in abdomen not quantified. Session focused on review of LTG to prepare for upcoming d/c, balance, amb, and stair navigation to promote safe home access. Pt initiated session with amb to toilet using CGA + RW. Pt required maxA for pericare and mnA for donning brief and pants. Pt then amb 200 ft to therapy gym using CGA + RW and fading to S + RW. Pt then completed blocked practice of stair navigation of 8 steps using CGA + 2 rails fading to distant S + 2 rails. Pt then amb 229ft + 238ft with slow gait speed and continued S + RW. Pt also completed balance exercises of neutral stance, narrow stance, and eyes closed using close S. At end of session, pt was left semi-reclined in bed with alarm engaged, nurse call bell and all needs in reach.     Therapy Documentation Precautions:  Precautions Precautions: Fall Precaution Comments: continous spitting Restrictions Weight Bearing Restrictions Per Provider Order: No General:      Therapy/Group: Individual Therapy  Warrick KANDICE Raspberry, PT, DPT 11/06/2023, 9:26 AM

## 2023-11-06 NOTE — Plan of Care (Signed)
  Problem: RH PAIN MANAGEMENT Goal: RH STG PAIN MANAGED AT OR BELOW PT'S PAIN GOAL Description: <4 w/ prns Outcome: Progressing

## 2023-11-06 NOTE — Progress Notes (Signed)
 PROGRESS NOTE   Subjective/Complaints:  Pt just finishing cleaning breakfast plate. Still having lower abdominal pain but says it's related to his frequent urination. +fatigue.   ROS: Patient denies fever, rash, sore throat, blurred vision, dizziness, nausea, vomiting, diarrhea, cough, shortness of breath or chest pain, joint or back/neck pain, headache.    Objective:   No results found.    Recent Labs    11/04/23 0537  WBC 3.3*  HGB 8.5*  HCT 27.6*  PLT 115*   Recent Labs    11/04/23 0537  NA 140  K 4.4  CL 106  CO2 22  GLUCOSE 93  BUN 27*  CREATININE 1.69*  CALCIUM  9.0    Intake/Output Summary (Last 24 hours) at 11/06/2023 0945 Last data filed at 11/06/2023 0700 Gross per 24 hour  Intake 610 ml  Output 500 ml  Net 110 ml        Physical Exam: Vital Signs Blood pressure (!) 118/55, pulse 61, temperature 98.3 F (36.8 C), temperature source Oral, resp. rate 20, height 5' 7.99 (1.727 m), weight 102.7 kg, SpO2 94%.  Physical Exam   PE: Constitutional: No distress . Vital signs reviewed. HEENT: NCAT, EOMI, oral membranes moist Neck: supple Cardiovascular: RRR without murmur. No JVD    Respiratory/Chest: CTA Bilaterally without wheezes or rales. Normal effort    GI/Abdomen: BS +, sl-tender in hypogastric area, non-distended Ext: no clubbing, cyanosis, 1-2+ LE edema Psych: pleasant and cooperative  Skin: A lot of skin tears on UEs and bruising Hemosiderin staining b/l calfs Left calf is wrapped. I didn't remove   Neuro: AAOx 4, follows all simple commands.  No further cognitive delay. Sensory exam: revealed normal sensation in all dermatomal regions in BL UE and LE grossly Motor exam: All 4 extremities antigravity and against resistance Coordination: Fine motor coordination was grossly normal.   Cranial nerves II through XII intact.  Assessment/Plan: 1. Functional deficits which require 3+  hours per day of interdisciplinary therapy in a comprehensive inpatient rehab setting. Physiatrist is providing close team supervision and 24 hour management of active medical problems listed below. Physiatrist and rehab team continue to assess barriers to discharge/monitor patient progress toward functional and medical goals  Care Tool:  Bathing    Body parts bathed by patient: Right arm, Left arm, Chest, Abdomen, Face   Body parts bathed by helper: Front perineal area, Buttocks, Right upper leg, Left upper leg, Right lower leg, Left lower leg     Bathing assist Assist Level: Maximal Assistance - Patient 24 - 49%     Upper Body Dressing/Undressing Upper body dressing   What is the patient wearing?: Pull over shirt    Upper body assist Assist Level: Minimal Assistance - Patient > 75%    Lower Body Dressing/Undressing Lower body dressing      What is the patient wearing?: Incontinence brief, Pants     Lower body assist Assist for lower body dressing: Maximal Assistance - Patient 25 - 49%     Toileting Toileting    Toileting assist Assist for toileting: Maximal Assistance - Patient 25 - 49%     Transfers Chair/bed transfer  Transfers assist  Chair/bed transfer  activity did not occur: Safety/medical concerns (unsafe to get up)  Chair/bed transfer assist level: Contact Guard/Touching assist     Locomotion Ambulation   Ambulation assist   Ambulation activity did not occur: Safety/medical concerns  Assist level: Minimal Assistance - Patient > 75% Assistive device: No Device Max distance: 12ft   Walk 10 feet activity   Assist  Walk 10 feet activity did not occur: Safety/medical concerns  Assist level: Minimal Assistance - Patient > 75% Assistive device: No Device   Walk 50 feet activity   Assist Walk 50 feet with 2 turns activity did not occur: Safety/medical concerns  Assist level: Minimal Assistance - Patient > 75% Assistive device: No Device     Walk 150 feet activity   Assist Walk 150 feet activity did not occur: Safety/medical concerns  Assist level: Minimal Assistance - Patient > 75% Assistive device: No Device    Walk 10 feet on uneven surface  activity   Assist Walk 10 feet on uneven surfaces activity did not occur: Safety/medical concerns         Wheelchair     Assist Is the patient using a wheelchair?: Yes Type of Wheelchair: Manual    Wheelchair assist level: Dependent - Patient 0%      Wheelchair 50 feet with 2 turns activity    Assist        Assist Level: Dependent - Patient 0%   Wheelchair 150 feet activity     Assist      Assist Level: Dependent - Patient 0%   Blood pressure (!) 118/55, pulse 61, temperature 98.3 F (36.8 C), temperature source Oral, resp. rate 20, height 5' 7.99 (1.727 m), weight 102.7 kg, SpO2 94%. Medical Problem List and Plan: 1. Functional deficits secondary to debility from GI bleed/delirium             -patient may  shower- cover skin tears             -ELOS/Goals: 12-14 days min A to supervision- 2-11 discharge date             --Continue CIR therapies including PT, OT, and SLP   2.  Antithrombotics: -DVT/anticoagulation:  Pharmaceutical: Other (comment)-_Pradaxa resumed on 01/21              -antiplatelet therapy: N/A 3. Pain Management: Tylenol  prn.  4. Mood/Behavior/Sleep/delirium: LCSW to follow for evaluation and support.              -antipsychotic agents: N/A--did not tolerate Seroquel     --Monitor sleep wake cycle. -Reminded nursing to add sleep log 1-28:  Did poorly with scheduled nightly Seroquel  on inpatient due to daytime somnolence.  Add mirtazapine  15 mg nightly, keep melatonin 5 mg as needed. 1-29: Reminded nursing to apply sleep log.  Added as needed trazodone  50 mg. 1-30: Slept intermittently 1 to 2 hours for a total of 4 hours overnight. start scheduled trazodone  50 mg nightly.  Encourage sleep, wake cycle. Encouraged family  to promote compliance with medications and sleep/wake cycle. Did discontinue Protonix  per family request, and Atarax  as needed due to sedating effects. -Family notes that at baseline, patient does not sleep well at night, naps often during the day 1-31: Sleep log not updated, patient endorsing poor sleep.  Also endorsing significant symptoms of depression with therapies.  increase trazodone  to 100 mg--No SI/HI, so will not involve psychiatry at this time. 2/2 sleep disturbed by frequent urination but otherwise no complaints 2-3: Sleep improved per log.  Patient agreeable to starting SSRI, fluoxetine  20 mg daily ordered for activating effects. 2/4: Tearful with PT yesterday; would benefit from neuropsych consult.  improved sleep with trazodone  100 mg..  Unfortunately, we will need to DC Prozac  due to interaction with Pradaxa  and hemoglobinuria. 2/5: family updated; agree best thing for mood moving forward is engagement with therapies and discharge home as soon as safe 2-7: Due to complaints of daytime lethargy, reduce trazodone  to 50 mg.  Is sleeping well overnight per sleep log. 2/8 slept ok (other than having to go to bathroom). Seemed very alert today. Literally cleaned breakfast tray 5. Neuropsych/cognition: This patient is not capable of making decisions on his own behalf.   -1-28: Adding telemonitoring, 4 side rails for safety.  Add Zyprexa  5 mg sublingual versus IM as needed for agitation, given patient is spitting most medication.  Restraints as above. --Have not required as needed Zyprexa   1/31: Overall, cognition seems to be improving a little today, with less preoccupation/distraction--continue to improve over the weekend 2-3  2/6: Neuropsych evaluation completed, patient was withdrawn/minimally interactive throughout--discussed with Dr. Corina 2-7, feel making gains in mood and engagement will be challenging moving forward given patient's goals/personality.    6. Skin/Wound Care:  Routine pressure relief measures.    -Monitor of skin tears, bruises.   - Cover areas of skin tears with Mepilex dressings to avoid compulsive picking  2-4: Asked nursing to change left lower extremity bandage and provide new images for chart due to pulling away - looks changed, clean 2/5  7. Fluids/Electrolytes/Nutrition: Monitor I/O check CMET in am   - 1/27: See below; 500 cc IVF today, encourage PO, recheck BMP in AM. Add calcium  supplement  -1-28: Labs improved, encourage p.o.'s.  1-30: AKI again today, with poor p.o. intakes.  Initiate IV fluids, repeat BMP in AM.  Mag low, give 4 g IV repletion, repeat in AM.  1/31: Magnesium  repleted.  Management of AKI as below.  P.o. intakes adequate  2/3: Magnesium  1.7, within normal limits.  Potassium 4.6.  2/8 follow up labs Monday  8. GIB/Duodenal AVM s/p APC:  To continue PPI BID X 4 weeks-->10/26/23   - HgB stable; see #18  -1-30: Family insistent on discontinuation of Protonix  due to possible side effects of cognitive deficits/ parkinsonism; understand this is a very unlikely contributor, discussed risks versus benefits with recent GI bleed, they continue to request removal of medication.  -1/31:  Protonix  Dced; will start famotidine  20 mg twice daily as alternative. Add CBC in AM, FOBT.  -2-3: Hemoglobin stable.  Large bowel movement this a.m. will DC FOBT.    - Stable on current medications  2/8 labs Monday  9. Right hemothorax s/p CT/fibrinolysis: Has been weaned off oxygen             --follow up with Dr. Maryalice in 2 months w/repeat CT chest   - Follow-up chest x-ray without ongoing findings.  10. Delirium/intermittent movement d/o: Improved/resolved  11. PAF/SVT/HTN: HR improved with titrated of coreg  to 12.5 mg bid.  -1/29: Continues intermittently refusing medications, heart rate well-controlled, blood pressure rising.  Resume home olmesartan  at low-dose 5 mg; substituted for formulary Avapro  37.5 mg daily.  Add hydralazine  10 mg  every 8 hours as needed for systolic blood pressure greater than 180, diastolic greater than 110  1-30: Blood pressure remains elevated, but he only just stated taking his medications today.  Family to encourage compliance, BNP today downtrending from prior so unlikely heart failure  exacerbation.  2/3 BUN/creatinine improving, is now taking medications consistently, increase Avapro  to 75 mg daily to approximate olmesartan  10 mg - start 2/4  2/5: Some mild hypotension yesterday, hypertension overnight, will monitor today for any symptomatic orthostasis.  2-6: Some increasing peripheral edema, blood pressures are stable.  Will recommend Ace wrapping right lower extremity, due to limitations of left lower extremity due to skin tear.  2/7: Reduce metoprolol  to 6.25 BID due to HR 40s overnight. DC flomax  as below.   2/8 HR only into 50's now--continue current dosing    11/06/2023    8:12 AM 11/06/2023    2:44 AM 11/05/2023    7:47 PM  Vitals with BMI  Systolic 118 144 853  Diastolic 55 67 74  Pulse 61 55 51     12.  Hypokalemia/Hypomagnesemia/hypocalcemia: K+ 3.1 this AM- was given KCL- will recheck in AM   - 1/27: K stable/improved; will start Oscal 500 mg BID for Ca 7.8  1-28: K3.3, start 20 mill equivalents potassium twice daily.  Retest later this week.  1-30: Potassium unchanged, resume twice daily repletion.  4 g IV mag as above Calcium  repleted  2-3: Potassium repleted.  Continue current regimen.  Magnesium  added on to a.m. labs late--resolved  13. HCAP: Treated with 7 day course of Zosyn    - No respiratory distress  -1-30: Chest x-ray today--no acute intrapulmonary process.  Pleural effusions resolved.  14. Gross hematuria/dysuria/incontinence: to follow up with Dr. Watt on outpatient basis. On flomax  0.4 mg daily.   -1-29: High proteinuria and some blood in urinalysis; negative for infection.  Consistent with prior urinalysis  2-4: Recurrent.  Was micro hemoglobinuria on 2-1, CT  abdomen and pelvis was canceled as below.  Will get new urinalysis/culture today, renal ultrasound to evaluate, DC Prozac  as above due to potential bleed risk with Pradaxa .Patient reporting possible urethral injury while inpatient contributing.   2/5: Renal ultrasound yesterday showing solid region within the bladder 1.3 x 1.1 x 1.4 cm; blood clot versus mass without positive Doppler.  Also found nonobstructing calculus 1.5 cm in left lower renal pelvis, along with left renal cysts.  Hematuria improving with discontinuation of Prozac ; can continue conservative management and follow-up outpatient with urology Dr. Butler as previously planned by internal medicine.   - Unfortunately, cannot initiate Pyridium due to CKD, will add Robaxin  500 mg every 8 hours as needed. 2/6: Low PVRs, improved appearance of urine.  Continue conservative management and outpatient follow-up with Dr. Watt as previously planned 2/7: DC flomax  due to ongoing incontinence with low PVRs -monitor PVRs over this weekend to ensure no retention  2/8 pt still with urinary frequency and a lot of discomfort.   -off flomax , voiding completely it appears, recent ucx neg  -will try myrbetriq  and observe for response this weekend 15. T2DM: Hgb A1c- 5.5. Diet controlled at this time             --has been off actos .   16. Acute on chronic renal failure -CKD stage IIIb:  BUN/SCr improving. Baseline per OP records appear 1.2-1.5.  --Followed by Dr. Gordy Blanch w/plan for renal bx prior to admission.              --encourage fluid intake- Cr 1.33 this AM up from 1.0- will recheck in Ama nd push fluids.   1/27: Continues uptrending to 1.5 today; Is only 240 yesterday; Push PO fluids and give 500 cc bolus today, check in AM  1-28: Creatinine improved  to 1.3.  P.o. intakes remain poor, will continue daily monitoring and bolus as needed  1-30: Resume IV fluids today for AKI.  BMP in AM.  1/31: Received 500 cc overnight before pulling IV,  creatinine uptrending.  Getting CT abdomen and pelvis today due to concern for renal stone as above.  2-3: Creatinine improved back to 1.5; increasing ACE inhibitor as above.  2/7: BUN, cr mildly increased, 27/1.69 - encourage PO fluids, recheck Monday  17. Hx Pancytopenia:  Bone bx w/ ICUS (brewing myelodysplasia) per Dr. Federico.    - 1/27: WBC down slightly; monitor--stable 2-3  18. Abdominal pain/nausea    - KUB without concerning findings 1/27   - Zofran  PRN added   - on protonix  40 mg BID--DC'd per family request, changed to famotidine  20 mg resolved 2/1  2-1: Abdominal pain, abdominal exam is normal, discontinue CT abdomen, would be concerned about worsening renal status with Omnipaque  - 2/4: multiple Bms overnight, mixed continence - 2-5: Likely due to urethral injury during inpatient cathing as above, likely small clot found in the bladder on ultrasound.  Aqua thermia pad over abdomen, Tylenol , and as needed Robaxin  for pain relief.  Cannot do NSAIDs due to recent GI bleed. 2/6: Ongoing, unchanged.  Offered increase in pain medication, patient is not using current as needed Robaxin , encouraged him to ask for this and asked nursing to give it today.  2/8 continue with current plan, see #14 as well   LOS: 13 days A FACE TO FACE EVALUATION WAS PERFORMED  Joshua [name] 11/06/2023, 9:45 AM

## 2023-11-07 ENCOUNTER — Inpatient Hospital Stay (HOSPITAL_COMMUNITY): Payer: Medicare HMO

## 2023-11-07 DIAGNOSIS — R1030 Lower abdominal pain, unspecified: Secondary | ICD-10-CM

## 2023-11-07 LAB — URINALYSIS, W/ REFLEX TO CULTURE (INFECTION SUSPECTED)
Bilirubin Urine: NEGATIVE
Glucose, UA: NEGATIVE mg/dL
Ketones, ur: NEGATIVE mg/dL
Leukocytes,Ua: NEGATIVE
Nitrite: NEGATIVE
Protein, ur: >=300 mg/dL — AB
RBC / HPF: >50 RBC/hpf (ref 0–5)
Specific Gravity, Urine: 1.012 (ref 1.005–1.030)
pH: 5 (ref 5.0–8.0)

## 2023-11-07 LAB — CBC
HCT: 26.4 % — ABNORMAL LOW (ref 39.0–52.0)
Hemoglobin: 8.2 g/dL — ABNORMAL LOW (ref 13.0–17.0)
MCH: 30.6 pg (ref 26.0–34.0)
MCHC: 31.1 g/dL (ref 30.0–36.0)
MCV: 98.5 fL (ref 80.0–100.0)
Platelets: 115 10*3/uL — ABNORMAL LOW (ref 150–400)
RBC: 2.68 MIL/uL — ABNORMAL LOW (ref 4.22–5.81)
RDW: 20 % — ABNORMAL HIGH (ref 11.5–15.5)
WBC: 3.4 10*3/uL — ABNORMAL LOW (ref 4.0–10.5)
nRBC: 0 % (ref 0.0–0.2)

## 2023-11-07 LAB — BASIC METABOLIC PANEL
Anion gap: 9 (ref 5–15)
BUN: 32 mg/dL — ABNORMAL HIGH (ref 8–23)
CO2: 22 mmol/L (ref 22–32)
Calcium: 8.6 mg/dL — ABNORMAL LOW (ref 8.9–10.3)
Chloride: 105 mmol/L (ref 98–111)
Creatinine, Ser: 1.51 mg/dL — ABNORMAL HIGH (ref 0.61–1.24)
GFR, Estimated: 48 mL/min — ABNORMAL LOW (ref >60)
Glucose, Bld: 104 mg/dL — ABNORMAL HIGH (ref 70–99)
Potassium: 4.3 mmol/L (ref 3.5–5.1)
Sodium: 136 mmol/L (ref 135–145)

## 2023-11-07 LAB — BRAIN NATRIURETIC PEPTIDE: B Natriuretic Peptide: 268.1 pg/mL — ABNORMAL HIGH (ref 0.0–100.0)

## 2023-11-07 LAB — VITAMIN B1: Vitamin B1 (Thiamine): 64.8 nmol/L — ABNORMAL LOW (ref 66.5–200.0)

## 2023-11-07 LAB — TROPONIN I (HIGH SENSITIVITY): Troponin I (High Sensitivity): 22 ng/L — ABNORMAL HIGH (ref <18)

## 2023-11-07 MED ORDER — MIRABEGRON ER 25 MG PO TB24
25.0000 mg | ORAL_TABLET | Freq: Once | ORAL | Status: AC
  Administered 2023-11-07: 25 mg via ORAL
  Filled 2023-11-07: qty 1

## 2023-11-07 MED ORDER — LIDOCAINE VISCOUS HCL 2 % MT SOLN
15.0000 mL | Freq: Once | OROMUCOSAL | Status: AC
  Administered 2023-11-07: 15 mL via OROMUCOSAL
  Filled 2023-11-07: qty 15

## 2023-11-07 MED ORDER — CALCIUM CARBONATE ANTACID 500 MG PO CHEW
2.0000 | CHEWABLE_TABLET | Freq: Once | ORAL | Status: AC
  Administered 2023-11-07: 400 mg via ORAL
  Filled 2023-11-07: qty 2

## 2023-11-07 MED ORDER — MIRABEGRON ER 50 MG PO TB24
50.0000 mg | ORAL_TABLET | Freq: Every day | ORAL | Status: DC
  Filled 2023-11-07: qty 1

## 2023-11-07 MED ORDER — SODIUM CHLORIDE 0.45 % IV SOLN
INTRAVENOUS | Status: DC
  Filled 2023-11-07 (×2): qty 500

## 2023-11-07 MED ORDER — CALCIUM CARBONATE 1250 (500 CA) MG PO TABS: 2.0000 | ORAL_TABLET | Freq: Once | ORAL | Status: DC

## 2023-11-07 NOTE — Progress Notes (Signed)
 Patient c/o chest discomfort/pain with sinus bradycardia as shown on EKG. On call provider/Rapid notified. New orders received.  11/07/23 0200  Provider Notification  Provider Name/Title Jereld Rubens (NP)  Date Provider Notified 11/06/23  Time Provider Notified 1140  Method of Notification Call  Notification Reason Other (Comment) (pt c/o chest dicomfort/pain)  Type of New Onset of Dysrhythmia Sinus bradycardia  Symptoms of New Onset of Dysrhythmia Chest pain  Provider response See new orders  Date of Provider Response 11/06/23  Time of Provider Response 1141  Rapid Response Notification  Name of Rapid Response RN Notified Mindy  Date Rapid Response Notified 11/07/23  Time Rapid Response Notified 0154

## 2023-11-07 NOTE — Progress Notes (Signed)
 Physical Therapy Session Note  Patient Details  Name: Joshua [name] MRN: [id number] Date of Birth: 01/19/1951  Today's Date: 11/07/2023 PT Individual Time: 0800-0845 PT Individual Time Calculation (min): 45 min   Short Term Goals: Week 2:  PT Short Term Goal 1 (Week 2): STG = LTG due to ELOS  Skilled Therapeutic Interventions/Progress Updates: Pt presents semi-reclined in bed and states  feeling terrible.  Pt requests need for BR.  Pt transfers sup to sit w/ supervision.  Pt transfers sit to stand w/ CGA and amb to BR w/ RW and CGA/close sup, cues fir posture.  Pt incontinent of bowel in brief and then continent in toilet, .  Pt required total A for pericare, some feces on pad and nursing will change.  Pt required mod A for new brief and pants donning in standing.  Pt amb multiple trials w/ RW up to 200' w/ close sup, occ CGA, continued verbal cues for flexed posture.  Pt states pain in abdomen decreased to 5/10 s/p BM.  Pt returned to room and remained sitting in TIS w/ chair alarm on and all needs in reach.     Therapy Documentation Precautions:  Precautions Precautions: Fall Precaution Comments: continous spitting Restrictions Weight Bearing Restrictions Per Provider Order: No General:   Vital Signs:   Pain:10/10 initially back and abdomen, decreased to 5/10 w/ BM. Pain Assessment Pain Scale: 0-10 Pain Score: 10-Worst pain ever Pain Type: Acute pain Pain Location: Abdomen Pain Orientation: Right;Left Pain Descriptors / Indicators: Burning;Aching;Discomfort Pain Frequency: Constant Pain Onset: On-going Patients Stated Pain Goal: 2 Pain Intervention(s): Medication (See eMAR)   Therapy/Group: Individual Therapy  Taisia Fantini P Rider Ermis 11/07/2023, 8:47 AM

## 2023-11-07 NOTE — Progress Notes (Signed)
 Occupational Therapy Discharge Summary  Patient Details  Name: Joshua [name] MRN: [id number] Date of Birth: August 08, 1951  Date of Discharge from OT service:November 08, 2023  Today's Date: 11/08/2023 OT Individual Time: 1123-1210 OT Individual Time Calculation (min): 47 min    Patient has met 11 of 11 long term goals due to improved activity tolerance, improved balance, postural control, ability to compensate for deficits, improved awareness, and improved coordination.  Patient to discharge at overall Supervision level.  Patient's care partner is independent to provide the necessary physical and cognitive assistance at discharge.    Reasons goals not met: n/a  Recommendation:  Patient will benefit from ongoing skilled OT services in home health setting to continue to advance functional skills in the area of BADL.  Equipment: Shower chair with back  Reasons for discharge: treatment goals met and discharge from hospital  Patient/family agrees with progress made and goals achieved: Yes  OT Discharge Precautions/Restrictions  Precautions Precautions: Fall Restrictions Weight Bearing Restrictions Per Provider Order: No  Pain Pain Assessment Pain Scale: 0-10 Pain Score: 3  Pain Type: Acute pain Pain Location: Abdomen Pain Orientation: Lower Pain Descriptors / Indicators: Constant Pain Onset: On-going ADL ADL Eating: Supervision/safety Where Assessed-Eating: Wheelchair Grooming: Independent, Supervision/safety Where Assessed-Grooming: Chair Upper Body Bathing: Contact guard Where Assessed-Upper Body Bathing: Sitting at sink, Chair Lower Body Bathing: Contact guard Where Assessed-Lower Body Bathing: Chair, Sitting at sink Upper Body Dressing: Supervision/safety Where Assessed-Upper Body Dressing: Chair Lower Body Dressing: Supervision/safety Where Assessed-Lower Body Dressing: Chair Toileting: Contact guard Where Assessed-Toileting: Toilet, Bedside Commode Toilet  Transfer: Furniture conservator/restorer Method: Proofreader: IT sales professional: Insurance underwriter: Administrator, arts Method: Designer, industrial/product: Therapist, occupational Baseline Vision/History: 1 Wears glasses Patient Visual Report: No change from baseline Vision Assessment?: No apparent visual deficits Perception  Perception: Within Functional Limits Praxis Praxis: WFL Cognition Cognition Overall Cognitive Status: Impaired/Different from baseline Arousal/Alertness: Awake/alert Orientation Level: Person;Place;Situation Memory: Impaired Brief Interview for Mental Status (BIMS) Repetition of Three Words (First Attempt): 3 Temporal Orientation: Year: Correct Temporal Orientation: Month: Accurate within 5 days Temporal Orientation: Day: Correct Recall: "Sock": Yes, no cue required Recall: "Blue": Yes, no cue required Recall: "Bed": No, could not recall BIMS Summary Score: 13 Sensation Sensation Light Touch: Appears Intact Hot/Cold: Appears Intact Proprioception: Appears Intact Stereognosis: Appears Intact Coordination Gross Motor Movements are Fluid and Coordinated: Yes Fine Motor Movements are Fluid and Coordinated: Yes Coordination and Movement Description: generalized weakness Motor  Motor Motor: Within Functional Limits Motor - Discharge Observations: generalized weakness, significant improvement from eval Mobility  Bed Mobility Bed Mobility: Sit to Supine;Supine to Sit Supine to Sit: Supervision/Verbal cueing Sit to Supine: Supervision/Verbal cueing Transfers Sit to Stand: Supervision/Verbal cueing Stand to Sit: Supervision/Verbal cueing  Trunk/Postural Assessment  Cervical Assessment Cervical Assessment: Exceptions to [facility] (forward head) Thoracic Assessment Thoracic Assessment: Exceptions to [facility] (thoracic kyphosis and rounded shoulders) Lumbar  Assessment Lumbar Assessment: Exceptions to [facility] (posterior pelvic tilt) Postural Control Postural Control: Deficits on evaluation Righting Reactions: delayed and inadequate Protective Responses: decreased Postural Limitations: decreased  Balance Balance Balance Assessed: Yes  Static Sitting Balance Static Sitting - Balance Support: Feet supported Static Sitting - Level of Assistance: 5: Stand by assistance Dynamic Sitting Balance Dynamic Sitting - Balance Support: Feet supported Dynamic Sitting - Level of Assistance: 5: Stand by assistance Static Standing Balance Static Standing - Balance Support: During functional activity;Bilateral upper extremity supported Static  Standing - Level of Assistance: 5: Stand by assistance Dynamic Standing Balance Dynamic Standing - Balance Support: Bilateral upper extremity supported;During functional activity Dynamic Standing - Level of Assistance: 5: Stand by assistance Extremity/Trunk Assessment RUE Assessment Active Range of Motion (AROM) Comments: WFL in all ranges. General Strength Comments: 60# right grip strength. 4+/5 shoulder, elbow, and wrist ranges. LUE Assessment LUE Assessment: Within Functional Limits General Strength Comments: 65#  grip strength, 4+/5 shoulder, elbow, wrist ranges. Skilled Intervention Patient agreeable to participate in OT session.   Patient participated in skilled OT session focusing on discharge planning and assessing overall functional performance during self care tasks. With time remaining, pt completed BUE strengthening utilizing hand weights in order to increase ability to complete sit to stands and functional transfers once discharged home.  Exercises completed included: seated, BUE, shoulder flexion, abduction 10X, 1 set, 3#; BUE bicep curl into cross body curl, 12X, 1 set, 5#.   Pt provided with VC and visual demonstration for proper form and technique.   Pt walked to/from room to Day room with SBA  utilizing RW.    Carollee Circle, OTR/L,CBIS  Supplemental OT - [facility] and [facility] Secure Chat Preferred   11/08/2023, 11:55 AM

## 2023-11-07 NOTE — Plan of Care (Signed)
  Problem: Consults Goal: RH GENERAL PATIENT EDUCATION Description: See Patient Education module for education specifics. Outcome: Progressing   Problem: RH BOWEL ELIMINATION Goal: RH STG MANAGE BOWEL WITH ASSISTANCE Description: STG Manage Bowel with minimal assistance Assistance. Outcome: Progressing   Problem: RH SKIN INTEGRITY Goal: RH STG SKIN FREE OF INFECTION/BREAKDOWN Description: Manage skin infection / breakdown with educational materials with minimal assistance Outcome: Progressing

## 2023-11-07 NOTE — Progress Notes (Addendum)
 Physical Therapy Session Note  Patient Details  Name: Joshua [name] MRN: [id number] Date of Birth: October 28, 1950  Today's Date: 11/07/2023 PT Individual Time: 8575-8562 PT Individual Time Calculation (min): 13 min  and Today's Date: 11/07/2023 PT Missed Time: 62 Minutes Missed Time Reason: Patient fatigue;Patient unwilling to participate;Pain  Short Term Goals: Week 2:  PT Short Term Goal 1 (Week 2): STG = LTG due to ELOS  Skilled Therapeutic Interventions/Progress Updates:      Pt supine inbed upon arrival. Pt initially agreeable to therapy, but then reports I just can't do what you are asking me to do today. Pt reports 5/10 generalized pain, predominately in pt back, therapist offered to notify nursing, pt refusing and says it won't do any good pt does not feel as though the pain medication is beneficial.   Pt assisted pt up in bed with +2 A and readjusted pt pillows, for improved comfort and posture. Pt refusing OOB therapy or bed level therex, despite encouragement 2/2 pain, fatigue.  Requesting therapy to return tomorrow. Will attempt to make up missed minutes as able.   Pt supine in bed with all needs within reach and bed alarm on.   Therapy Documentation Precautions:  Precautions Precautions: Fall Precaution Comments: continous spitting Restrictions Weight Bearing Restrictions Per Provider Order: No  Therapy/Group: Individual Therapy  [facility] [place], [place], DPT  11/07/2023, 7:55 AM

## 2023-11-07 NOTE — Progress Notes (Signed)
 PROGRESS NOTE   Subjective/Complaints:  Pt complaining of ongoing hypogastric pain and burning when he urinates. None of the changes we mad yesterday helped.   ROS: Patient denies fever, rash, sore throat, blurred vision, dizziness, nausea, vomiting, diarrhea, cough, shortness of breath or chest pain,  headache, or mood change.     Objective:   No results found.    Recent Labs    11/07/23 0040  WBC 3.4*  HGB 8.2*  HCT 26.4*  PLT 115*   Recent Labs    11/07/23 0040  NA 136  K 4.3  CL 105  CO2 22  GLUCOSE 104*  BUN 32*  CREATININE 1.51*  CALCIUM  8.6*    Intake/Output Summary (Last 24 hours) at 11/07/2023 0913 Last data filed at 11/07/2023 0414 Gross per 24 hour  Intake --  Output 700 ml  Net -700 ml        Physical Exam: Vital Signs Blood pressure (!) 160/96, pulse 63, temperature 97.9 F (36.6 C), resp. rate 18, height 5' 7.99 (1.727 m), weight 102.7 kg, SpO2 94%.  Physical Exam   PE: Constitutional: No distress . Vital signs reviewed. HEENT: NCAT, EOMI, oral membranes moist Neck: supple Cardiovascular: RRR without murmur. No JVD    Respiratory/Chest: CTA Bilaterally without wheezes or rales. Normal effort    GI/Abdomen: BS +, remains tender to palpation over lower 1/3rd of abdomen and particularly near pubic symphysis area. No masses, no distention. Ext: no clubbing, cyanosis, or edema Psych: pleasant and cooperative   Skin: A lot of skin tears on UEs and bruising Hemosiderin staining b/l calfs Left calf is wrapped. I didn't remove 2/9   Neuro: AAOx 4, follows all simple commands.  No further cognitive delay. Sensory exam: revealed normal sensation in all dermatomal regions in BL UE and LE grossly Motor exam: All 4 extremities antigravity and against resistance Coordination: Fine motor coordination was grossly normal.   Cranial nerves II through XII intact.  Assessment/Plan: 1.  Functional deficits which require 3+ hours per day of interdisciplinary therapy in a comprehensive inpatient rehab setting. Physiatrist is providing close team supervision and 24 hour management of active medical problems listed below. Physiatrist and rehab team continue to assess barriers to discharge/monitor patient progress toward functional and medical goals  Care Tool:  Bathing    Body parts bathed by patient: Right arm, Left arm, Chest, Abdomen, Face   Body parts bathed by helper: Front perineal area, Buttocks, Right upper leg, Left upper leg, Right lower leg, Left lower leg     Bathing assist Assist Level: Maximal Assistance - Patient 24 - 49%     Upper Body Dressing/Undressing Upper body dressing   What is the patient wearing?: Pull over shirt    Upper body assist Assist Level: Minimal Assistance - Patient > 75%    Lower Body Dressing/Undressing Lower body dressing      What is the patient wearing?: Incontinence brief, Pants     Lower body assist Assist for lower body dressing: Maximal Assistance - Patient 25 - 49%     Toileting Toileting    Toileting assist Assist for toileting: Moderate Assistance - Patient 50 - 74%  Transfers Chair/bed transfer  Transfers assist  Chair/bed transfer activity did not occur: Safety/medical concerns (unsafe to get up)  Chair/bed transfer assist level: Contact Guard/Touching assist     Locomotion Ambulation   Ambulation assist   Ambulation activity did not occur: Safety/medical concerns  Assist level: Contact Guard/Touching assist Assistive device: Walker-rolling Max distance: 200   Walk 10 feet activity   Assist  Walk 10 feet activity did not occur: Safety/medical concerns  Assist level: Contact Guard/Touching assist Assistive device: Walker-rolling   Walk 50 feet activity   Assist Walk 50 feet with 2 turns activity did not occur: Safety/medical concerns  Assist level: Contact Guard/Touching  assist Assistive device: Walker-rolling    Walk 150 feet activity   Assist Walk 150 feet activity did not occur: Safety/medical concerns  Assist level: Contact Guard/Touching assist Assistive device: Walker-rolling    Walk 10 feet on uneven surface  activity   Assist Walk 10 feet on uneven surfaces activity did not occur: Safety/medical concerns         Wheelchair     Assist Is the patient using a wheelchair?: Yes Type of Wheelchair: Manual    Wheelchair assist level: Dependent - Patient 0%      Wheelchair 50 feet with 2 turns activity    Assist        Assist Level: Dependent - Patient 0%   Wheelchair 150 feet activity     Assist      Assist Level: Dependent - Patient 0%   Blood pressure (!) 160/96, pulse 63, temperature 97.9 F (36.6 C), resp. rate 18, height 5' 7.99 (1.727 m), weight 102.7 kg, SpO2 94%. Medical Problem List and Plan: 1. Functional deficits secondary to debility from GI bleed/delirium             -patient may  shower- cover skin tears             -ELOS/Goals: 12-14 days min A to supervision- 2-11 discharge date           -Continue CIR therapies including PT, OT, and SLP   2.  Antithrombotics: -DVT/anticoagulation:  Pharmaceutical: Other (comment)-_Pradaxa resumed on 01/21              -antiplatelet therapy: N/A 3. Pain Management: Tylenol  prn.  4. Mood/Behavior/Sleep/delirium: LCSW to follow for evaluation and support.              -antipsychotic agents: N/A--did not tolerate Seroquel     --Monitor sleep wake cycle. -Reminded nursing to add sleep log 1-28:  Did poorly with scheduled nightly Seroquel  on inpatient due to daytime somnolence.  Add mirtazapine  15 mg nightly, keep melatonin 5 mg as needed. 1-29: Reminded nursing to apply sleep log.  Added as needed trazodone  50 mg. 1-30: Slept intermittently 1 to 2 hours for a total of 4 hours overnight. start scheduled trazodone  50 mg nightly.  Encourage sleep, wake  cycle. Encouraged family to promote compliance with medications and sleep/wake cycle. Did discontinue Protonix  per family request, and Atarax  as needed due to sedating effects. -Family notes that at baseline, patient does not sleep well at night, naps often during the day 1-31: Sleep log not updated, patient endorsing poor sleep.  Also endorsing significant symptoms of depression with therapies.  increase trazodone  to 100 mg--No SI/HI, so will not involve psychiatry at this time. 2/2 sleep disturbed by frequent urination but otherwise no complaints 2-3: Sleep improved per log.  Patient agreeable to starting SSRI, fluoxetine  20 mg daily ordered for activating  effects. 2/4: Tearful with PT yesterday; would benefit from neuropsych consult.  improved sleep with trazodone  100 mg..  Unfortunately, we will need to DC Prozac  due to interaction with Pradaxa  and hemoglobinuria. 2/5: family updated; agree best thing for mood moving forward is engagement with therapies and discharge home as soon as safe 2-7: Due to complaints of daytime lethargy, reduce trazodone  to 50 mg.  Is sleeping well overnight per sleep log. 2/9 appears more alert this weekend but now focused on pain 5. Neuropsych/cognition: This patient is not capable of making decisions on his own behalf.   -1-28: Adding telemonitoring, 4 side rails for safety.  Add Zyprexa  5 mg sublingual versus IM as needed for agitation, given patient is spitting most medication.  Restraints as above. --Have not required as needed Zyprexa   1/31: Overall, cognition seems to be improving a little today, with less preoccupation/distraction--continue to improve over the weekend 2-3  2/6: Neuropsych evaluation completed, patient was withdrawn/minimally interactive throughout--discussed with Dr. Corina 2-7, feel making gains in mood and engagement will be challenging moving forward given patient's goals/personality.    6. Skin/Wound Care: Routine pressure relief  measures.    -Monitor of skin tears, bruises.   - Cover areas of skin tears with Mepilex dressings to avoid compulsive picking  2-4: Asked nursing to change left lower extremity bandage and provide new images for chart due to pulling away - looks changed, clean 2/5  7. Fluids/Electrolytes/Nutrition: Monitor I/O check CMET in am   - 1/27: See below; 500 cc IVF today, encourage PO, recheck BMP in AM. Add calcium  supplement  -1-28: Labs improved, encourage p.o.'s.  1-30: AKI again today, with poor p.o. intakes.  Initiate IV fluids, repeat BMP in AM.  Mag low, give 4 g IV repletion, repeat in AM.  1/31: Magnesium  repleted.  Management of AKI as below.  P.o. intakes adequate  2/3: Magnesium  1.7, within normal limits.  Potassium 4.6.  2/8 follow up labs Monday  8. GIB/Duodenal AVM s/p APC:  To continue PPI BID X 4 weeks-->10/26/23   - HgB stable; see #18  -1-30: Family insistent on discontinuation of Protonix  due to possible side effects of cognitive deficits/ parkinsonism; understand this is a very unlikely contributor, discussed risks versus benefits with recent GI bleed, they continue to request removal of medication.  -1/31:  Protonix  Dced; will start famotidine  20 mg twice daily as alternative. Add CBC in AM, FOBT.  -2-3: Hemoglobin stable.  Large bowel movement this a.m. will DC FOBT.    - Stable on current medications  2/8 labs Monday  9. Right hemothorax s/p CT/fibrinolysis: Has been weaned off oxygen             --follow up with Dr. Maryalice in 2 months w/repeat CT chest   - Follow-up chest x-ray without ongoing findings.  10. Delirium/intermittent movement d/o: Improved/resolved  11. PAF/SVT/HTN: HR improved with titrated of coreg  to 12.5 mg bid.  -1/29: Continues intermittently refusing medications, heart rate well-controlled, blood pressure rising.  Resume home olmesartan  at low-dose 5 mg; substituted for formulary Avapro  37.5 mg daily.  Add hydralazine  10 mg every 8 hours as needed for  systolic blood pressure greater than 180, diastolic greater than 110  1-30: Blood pressure remains elevated, but he only just stated taking his medications today.  Family to encourage compliance, BNP today downtrending from prior so unlikely heart failure exacerbation.  2/3 BUN/creatinine improving, is now taking medications consistently, increase Avapro  to 75 mg daily to approximate olmesartan   10 mg - start 2/4  2/5: Some mild hypotension yesterday, hypertension overnight, will monitor today for any symptomatic orthostasis.  2-6: Some increasing peripheral edema, blood pressures are stable.  Will recommend Ace wrapping right lower extremity, due to limitations of left lower extremity due to skin tear.  2/7: Reduce metoprolol  to 6.25 BID due to HR 40s overnight. DC flomax  as below.   2/9 HR only dropping into 50's--continue current dosing    11/07/2023    4:26 AM 11/06/2023   11:26 PM 11/06/2023    7:13 PM  Vitals with BMI  Systolic 160 145 872  Diastolic 96 80 55  Pulse 63 52 63     12.  Hypokalemia/Hypomagnesemia/hypocalcemia: K+ 3.1 this AM- was given KCL- will recheck in AM   - 1/27: K stable/improved; will start Oscal 500 mg BID for Ca 7.8  1-28: K3.3, start 20 mill equivalents potassium twice daily.  Retest later this week.  1-30: Potassium unchanged, resume twice daily repletion.  4 g IV mag as above Calcium  repleted  2-3: Potassium repleted.  Continue current regimen.  Magnesium  added on to a.m. labs late--resolved  13. HCAP: Treated with 7 day course of Zosyn    - No respiratory distress  -1-30: Chest x-ray today--no acute intrapulmonary process.  Pleural effusions resolved.  14. Gross hematuria/dysuria/incontinence/hypogastric pain: to follow up with Dr. Watt on outpatient basis. On flomax  0.4 mg daily.   -1-29: High proteinuria and some blood in urinalysis; negative for infection.  Consistent with prior urinalysis  2-4: Recurrent.  Was micro hemoglobinuria on 2-1, CT abdomen and  pelvis was canceled as below.  Will get new urinalysis/culture today, renal ultrasound to evaluate, DC Prozac  as above due to potential bleed risk with Pradaxa .Patient reporting possible urethral injury while inpatient contributing.   2/5: Renal ultrasound yesterday showing solid region within the bladder 1.3 x 1.1 x 1.4 cm; blood clot versus mass without positive Doppler.  Also found nonobstructing calculus 1.5 cm in left lower renal pelvis, along with left renal cysts.  Hematuria improving with discontinuation of Prozac ; can continue conservative management and follow-up outpatient with urology Dr. Butler as previously planned by internal medicine.   - Unfortunately, cannot initiate Pyridium due to CKD, will add Robaxin  500 mg every 8 hours as needed. 2/6: Low PVRs, improved appearance of urine.  Continue conservative management and outpatient follow-up with Dr. Watt as previously planned 2/7: DC flomax  due to ongoing incontinence with low PVRs -monitor PVRs over this weekend to ensure no retention  2/8 pt still with urinary frequency and a lot of discomfort.   -off flomax , voiding completely it appears, recent ucx neg  -will try myrbetriq  and observe for response this weekend,  -oxycodone  for pain  2/9 pt with ongoing dysuria, hypogastric discomfort   -increase myrbetriq  to 50mg    -repeat ucx today, check kub   -may need urology consult tomorrow if no improvement 15. T2DM: Hgb A1c- 5.5. Diet controlled at this time             --has been off actos .   16. Acute on chronic renal failure -CKD stage IIIb:  BUN/SCr improving. Baseline per OP records appear 1.2-1.5.  --Followed by Dr. Gordy Blanch w/plan for renal bx prior to admission.              --encourage fluid intake- Cr 1.33 this AM up from 1.0- will recheck in Ama nd push fluids.   1/27: Continues uptrending to 1.5 today; Is only 240 yesterday;  Push PO fluids and give 500 cc bolus today, check in AM  1-28: Creatinine improved to 1.3.  P.o.  intakes remain poor, will continue daily monitoring and bolus as needed  1-30: Resume IV fluids today for AKI.  BMP in AM.  1/31: Received 500 cc overnight before pulling IV, creatinine uptrending.  Getting CT abdomen and pelvis today due to concern for renal stone as above.  2-3: Creatinine improved back to 1.5; increasing ACE inhibitor as above.  2/7: BUN, cr mildly increased, 27/1.69 - encourage PO fluids, recheck Monday  2/9 BUN increased to 32-   -will give 500 cc iv 1/2ns today   -recheck in AM  17. Hx Pancytopenia:  Bone bx w/ ICUS (brewing myelodysplasia) per Dr. Federico.    - 1/27: WBC down slightly; monitor--stable 2-3  18. Abdominal pain/nausea    - KUB without concerning findings 1/27   - Zofran  PRN added   - on protonix  40 mg BID--DC'd per family request, changed to famotidine  20 mg resolved 2/1  2-1: Abdominal pain, abdominal exam is normal, discontinue CT abdomen, would be concerned about worsening renal status with Omnipaque  - 2/4: multiple Bms overnight, mixed continence - 2-5: Likely due to urethral injury during inpatient cathing as above, likely small clot found in the bladder on ultrasound.  Aqua thermia pad over abdomen, Tylenol , and as needed Robaxin  for pain relief.  Cannot do NSAIDs due to recent GI bleed. 2/9 continue with plan above as well as in #14    LOS: 14 days A FACE TO FACE EVALUATION WAS PERFORMED  Joshua [name] 11/07/2023, 9:13 AM

## 2023-11-07 NOTE — Significant Event (Signed)
 Rapid Response Event Note   Reason for Call :  CP  Pt received Calcium  carbonate at 1824, pepcid  at 2132, and maalox at 2331. EKG, Trop, BNP, CBC done prior to calling RRT.  Initial Focused Assessment:  Pt lying in bed with eyes closed, in no visible distress. Pt c/o 5/10 epigastric pain that is radiating to his lower abd. He denies SOB/dizziness. Lungs are CTA. ABD large/obese. Skin warm and dry.   HR-54, BP-145/80, RR-20, SpO2-95% on RA.   Interventions:  EKG-SB, no STE Trop-22 BNP-268  Mercedes, PA: BMP Tums x 2 now Viscous lidocaine  x 1  Plan of Care:  EKG/trop are WNL. ?Gastric source of pain?. Treat for indigestion. Please call RRT if further assistance needed.   Event Summary:   MD Notified: Jereld, Rehab PA Call [phone] Arrival [phone] End [id number]  Tish Graeme Piety, RN

## 2023-11-08 LAB — BASIC METABOLIC PANEL
Anion gap: 20 — ABNORMAL HIGH (ref 5–15)
Anion gap: 13 (ref 5–15)
BUN: 34 mg/dL — ABNORMAL HIGH (ref 8–23)
BUN: 35 mg/dL — ABNORMAL HIGH (ref 8–23)
CO2: 17 mmol/L — ABNORMAL LOW (ref 22–32)
CO2: 20 mmol/L — ABNORMAL LOW (ref 22–32)
Calcium: 9.8 mg/dL (ref 8.9–10.3)
Calcium: 9 mg/dL (ref 8.9–10.3)
Chloride: 102 mmol/L (ref 98–111)
Chloride: 103 mmol/L (ref 98–111)
Creatinine, Ser: 2.01 mg/dL — ABNORMAL HIGH (ref 0.61–1.24)
Creatinine, Ser: 2.81 mg/dL — ABNORMAL HIGH (ref 0.61–1.24)
GFR, Estimated: 34 mL/min — ABNORMAL LOW (ref >60)
GFR, Estimated: 23 mL/min — ABNORMAL LOW (ref >60)
Glucose, Bld: 108 mg/dL — ABNORMAL HIGH (ref 70–99)
Glucose, Bld: 96 mg/dL (ref 70–99)
Potassium: 4.5 mmol/L (ref 3.5–5.1)
Potassium: 4.7 mmol/L (ref 3.5–5.1)
Sodium: 139 mmol/L (ref 135–145)
Sodium: 136 mmol/L (ref 135–145)

## 2023-11-08 LAB — CBC
HCT: 31.8 % — ABNORMAL LOW (ref 39.0–52.0)
Hemoglobin: 10 g/dL — ABNORMAL LOW (ref 13.0–17.0)
MCH: 31 pg (ref 26.0–34.0)
MCHC: 31.4 g/dL (ref 30.0–36.0)
MCV: 98.5 fL (ref 80.0–100.0)
Platelets: 126 K/uL — ABNORMAL LOW (ref 150–400)
RBC: 3.23 MIL/uL — ABNORMAL LOW (ref 4.22–5.81)
RDW: 19.7 % — ABNORMAL HIGH (ref 11.5–15.5)
WBC: 3.4 K/uL — ABNORMAL LOW (ref 4.0–10.5)
nRBC: 0 % (ref 0.0–0.2)

## 2023-11-08 LAB — BRAIN NATRIURETIC PEPTIDE: B Natriuretic Peptide: 379.1 pg/mL — ABNORMAL HIGH (ref 0.0–100.0)

## 2023-11-08 MED ORDER — SODIUM CHLORIDE 0.9 % IV SOLN: INTRAVENOUS | Status: AC

## 2023-11-08 MED ORDER — SODIUM CHLORIDE 0.9 % IV SOLN: INTRAVENOUS | Status: DC

## 2023-11-08 NOTE — Plan of Care (Signed)

## 2023-11-08 NOTE — Progress Notes (Signed)
 Physical Therapy Discharge Summary  Patient Details  Name: Joshua [name] MRN: [id number] Date of Birth: April 18, 1951  Date of Discharge from PT service:November 08, 2023  Today's Date: 11/08/2023 PT Individual Time: 0852-1002 PT Individual Time Calculation (min): 70 min    Patient has met 8 of 8 long term goals due to improved activity tolerance, improved balance, improved postural control, increased strength, improved attention, improved awareness, and improved coordination.  Patient to discharge at an ambulatory level Supervision.   Patient's care partner is independent to provide the necessary cognitive assistance at discharge.  Reasons goals not met: N/A  Recommendation:  Patient will benefit from ongoing skilled PT services in home health setting to continue to advance safe functional mobility, address ongoing impairments in gait mechanics, dynamic standing balance, and minimize fall risk.  Equipment: No equipment provided  Reasons for discharge: treatment goals met and discharge from hospital  Patient/family agrees with progress made and goals achieved: Yes  PT Discharge Skilled Pt presents in room in [facility], agreeable to PT. Pt with significant improvement in cognition this session, improved memory and attention. Pt also with improved mood this session reporting excited about his discharge tomorrow. Session focused on gait training, transfer training, therapeutic exercise. Pt completes sit<>stand with and without RW with supervision throughout session.  Pt completes sit<>stand to RW and ambulates to main gym with cues for direction otherwise supervision for postural stability with pt demonstrating increased trunk flexion however improved upright gaze throughout gait.  Pt completes up/down 12 steps, first 3 steps pt completes with RUE support on RHR only to simulate home environment however pt demonstrating increased difficulty, cued for side stepping technique using BUE support on  RHR with pt demonstrating improved technique, requires CGA only throughout task.  Pt then ambulates with RW to ortho gym with supervision, cues for direction. Pt asking appropriate questions about DC during gait and rest breaks. Pt completes car transfer with car set to simulate personal vehicle height, pt requires cues for technique with pt demonstrating and verbalizing understanding.  Pt then ambulates up/down ramp with supervision and RW.  Pt completes TUG for assessment of fall risk in an average of 16.26 seconds indicating increased risk for falls. Pt educated on fall risk and verbalizes understanding. 12.68 seconds 18.71 seconds 17.39 seconds Average 16.26 seconds  Pt completes standing therex to promote BLE strengthening, activity tolerance, and muscle fiber recruitment needed for functional transfers including: Marching x20 alternating BLE Sit<>stands x10 Hamstring curl x10 BLE (BUE support on RW)  Pt returns to room ambulating with RW supervision and remains seated in [facility] with all needs within reach, cal light in place and chair alarm donned and activated at end of session.   Precautions/Restrictions Precautions Precautions: Fall Restrictions Weight Bearing Restrictions Per Provider Order: No Pain Interference Pain Interference Pain Effect on Sleep: 4. Almost constantly Pain Interference with Therapy Activities: 3. Frequently Pain Interference with Day-to-Day Activities: 1. Rarely or not at all Cognition Overall Cognitive Status: Impaired/Different from baseline Arousal/Alertness: Awake/alert Orientation Level: Oriented X4 Year: 2025 Month: February Day of Week: Incorrect Sensation Sensation Light Touch: Appears Intact Hot/Cold: Appears Intact Proprioception: Appears Intact Stereognosis: Appears Intact Coordination Gross Motor Movements are Fluid and Coordinated: Yes Fine Motor Movements are Fluid and Coordinated: Yes Coordination and Movement Description:  generalized weakness Motor  Motor Motor: Within Functional Limits Motor - Discharge Observations: generalized weakness, significant improvement from eval  Mobility Bed Mobility Bed Mobility: Sit to Supine;Supine to Sit Supine to Sit: Supervision/Verbal cueing Sit  to Supine: Supervision/Verbal cueing Transfers Transfers: Sit to Stand;Stand to Sit;Stand Pivot Transfers Sit to Stand: Supervision/Verbal cueing Stand to Sit: Supervision/Verbal cueing Stand Pivot Transfers: Supervision/Verbal cueing Stand Pivot Transfer Details: Verbal cues for safe use of DME/AE Transfer (Assistive device): Rolling walker Locomotion  Gait Ambulation: Yes Gait Assistance: Supervision/Verbal cueing Gait Distance (Feet): 350 Feet Assistive device: Rolling walker Gait Assistance Details: Verbal cues for precautions/safety Gait Gait: Yes Gait Pattern: Impaired Gait Pattern: Decreased stride length Gait velocity: decreased Stairs / Additional Locomotion Stairs: Yes Stairs Assistance: Contact Guard/Touching assist Stair Management Technique: One rail Right;Two rails Number of Stairs: 12 Height of Stairs: 6 Ramp: Supervision/Verbal cueing Wheelchair Mobility Wheelchair Mobility: No  Trunk/Postural Assessment  Cervical Assessment Cervical Assessment: Exceptions to [facility] (forward head) Thoracic Assessment Thoracic Assessment: Exceptions to [facility] (thoracic kyphosis and rounded shoulders) Lumbar Assessment Lumbar Assessment: Exceptions to [facility] (posterior pelvic tilt) Postural Control Postural Control: Deficits on evaluation Righting Reactions: delayed and inadequate Protective Responses: decreased Postural Limitations: decreased  Balance Balance Balance Assessed: Yes Standardized Balance Assessment Standardized Balance Assessment: Timed Up and Go Test Timed Up and Go Test TUG: Normal TUG Normal TUG (seconds): 16.26 Static Sitting Balance Static Sitting - Balance Support: Feet supported Static  Sitting - Level of Assistance: 5: Stand by assistance Dynamic Sitting Balance Dynamic Sitting - Balance Support: Feet supported Dynamic Sitting - Level of Assistance: 5: Stand by assistance Static Standing Balance Static Standing - Balance Support: During functional activity;Bilateral upper extremity supported Static Standing - Level of Assistance: 5: Stand by assistance Dynamic Standing Balance Dynamic Standing - Balance Support: Bilateral upper extremity supported;During functional activity Dynamic Standing - Level of Assistance: 5: Stand by assistance Extremity Assessment  RLE Assessment RLE Assessment: Within Functional Limits General Strength Comments: grossly 4+/5 LLE Assessment LLE Assessment: Within Functional Limits General Strength Comments: grossly 4+/5   Annia Kilts PT, DPT 11/08/2023, 10:06 AM

## 2023-11-08 NOTE — Progress Notes (Signed)
 Pt complain of having poor night sleep. Stated cannot fall asleep. Pt is on melatonin/Trazodone . Was up most of the night.

## 2023-11-08 NOTE — Progress Notes (Signed)
 Speech Language Pathology Discharge Summary  Patient Details  Name: Joshua [name] MRN: [id number] Date of Birth: 07-09-1951  Date of Discharge from SLP service:November 08, 2023  Today's Date: 11/08/2023 SLP Individual Time: 1336-1430 SLP Individual Time Calculation (min): 54 min  Skilled Therapeutic Interventions: SLP conducted skilled therapy session targeting cognitive retraining goals. Patient participated well throughout all tasks and exhibited significant improvements to cognitive function compared to previous sessions. Patient participated in mildly complex cognitive tasks and benefited from supervision throughout. Patient's family present and engaged in discussion with SLP re: medication management strategies post-discharge. Patient was left in chair with call bell in reach and chair alarm set. Patient is appropriate for discharge. See full discharge summary below.  Patient has met 4 of 4 long term goals.  Patient to discharge at overall Supervision level.  Reasons goals not met: n/a   Clinical Impression/Discharge Summary: Patient has made excellent progress during therapy stay and has exceeded all long term goals set for admission. Patient is currently functioning at an overall supervision level for cognitive functions. He would benefit from continued SLP services at next venue of care to target lingering deficits in cognitive domains. Patient and family education complete. SLP will sign off.   Care Partner:  Caregiver Able to Provide Assistance: Yes  Type of Caregiver Assistance: Cognitive  Recommendation:  24 hour supervision/assistance;Home Health SLP;Outpatient SLP  Rationale for SLP Follow Up: Maximize cognitive function and independence   Equipment: n/a   Reasons for discharge: Treatment goals met   Tysheka Fanguy, M.A., CCC-SLP   [name] 11/08/2023, 4:07 PM

## 2023-11-08 NOTE — Plan of Care (Signed)
  Problem: RH Cognition - SLP Goal: RH LTG Patient will demonstrate orientation with cues Description:  LTG:  Patient will demonstrate orientation to person/place/time/situation with cues (SLP)   Outcome: Completed/Met   Problem: RH Problem Solving Goal: LTG Patient will demonstrate problem solving for (SLP) Description: LTG:  Patient will demonstrate problem solving for basic/complex daily situations with cues  (SLP) Outcome: Completed/Met   Problem: RH Memory Goal: LTG Patient will demonstrate ability for day to day (SLP) Description: LTG:   Patient will demonstrate ability for day to day recall/carryover during cognitive/linguistic activities with assist  (SLP) Outcome: Completed/Met   Problem: RH Awareness Goal: LTG: Patient will demonstrate awareness during functional activites type of (SLP) Description: LTG: Patient will demonstrate awareness during functional activites type of (SLP) Outcome: Completed/Met

## 2023-11-08 NOTE — Progress Notes (Signed)
 Patient ID: Joshua [name], male   DOB: [birth year], 73 y.o.   MRN: [id number]  SW received message from pt wife reporting pt now has a catheter and would like to know the reason. SW shared with medical team, and asked someone to follow-up.   Norval Been, MSW, LCSW Office: [phone] Cell: [phone] Fax: [phone]

## 2023-11-08 NOTE — Progress Notes (Signed)
PROGRESS NOTE   Subjective/Complaints:  No acute complaints. Ongoing abdominal pain / dysuria.  Cr. Very elevated today 2.01, BUN 34; CBC appears hemoconcentrated with Hgb up 2. HTN overnight 160-170s /70s Got 135 ccs 1/2 NS IVF yesterday; PO minimal. Multiple Bms overnight  ROS: Patient denies fever, rash, sore throat, blurred vision, dizziness, nausea, vomiting, diarrhea, cough, shortness of breath or chest pain,  headache, or mood change.  + abdominal pain / dysuria   Objective:   DG Abd 1 View Result Date: 11/07/2023 CLINICAL DATA:  Abdominal pain EXAM: ABDOMEN - 1 VIEW COMPARISON:  10/25/2023 FINDINGS: Three supine frontal views of the abdomen and pelvis are obtained. No bowel obstruction or ileus. Moderate gaseous distention of the stomach. No masses or abnormal calcifications. Patchy right basilar consolidation and small right effusion. No acute bony abnormalities. IMPRESSION: 1. No bowel obstruction or ileus. 2. Patchy right basilar consolidation and small right effusion. Electronically Signed   By: Sharlet Salina M.D.   On: 11/07/2023 17:29      Recent Labs    11/07/23 0040 11/08/23 0535  WBC 3.4* 3.4*  HGB 8.2* 10.0*  HCT 26.4* 31.8*  PLT 115* 126*   Recent Labs    11/07/23 0040 11/08/23 0535  NA 136 139  K 4.3 4.5  CL 105 102  CO2 22 17*  GLUCOSE 104* 108*  BUN 32* 34*  CREATININE 1.51* 2.01*  CALCIUM 8.6* 9.8    Intake/Output Summary (Last 24 hours) at 11/08/2023 0810 Last data filed at 11/07/2023 1747 Gross per 24 hour  Intake 375.87 ml  Output 620 ml  Net -244.13 ml        Physical Exam: Vital Signs Blood pressure (!) 176/87, pulse (!) 59, temperature 97.9 F (36.6 C), resp. rate 18, height 5' 7.99" (1.727 m), weight 102.7 kg, SpO2 98%.  Physical Exam   PE: Constitutional: No distress . Vital signs reviewed.  Sitting upright in bedside. HEENT: NCAT, EOMI, oral membranes moist Neck:  supple Cardiovascular: RRR without murmur. No JVD   .  Peripheral edema significantly decreased from last week, now trace. Respiratory/Chest: CTA Bilaterally without wheezes or rales. Normal effort    GI/Abdomen: BS +, remains tender to palpation over lower 1/3rd of abdomen and particularly near pubic symphysis area. No masses, no distention.--Ongoing, unchanged Ext: no clubbing, cyanosis, or edema Psych: pleasant and cooperative   Skin: A lot of skin tears on UE"s and bruising Hemosiderin staining b/l calfs Left calf is wrapped.    Neuro: AAOx 4, follows all simple commands.  No further cognitive delay. Sensory exam: revealed normal sensation in all dermatomal regions in BL UE and LE grossly Motor exam: All 4 extremities antigravity and against resistance Coordination: Fine motor coordination was grossly normal.   Cranial nerves II through XII intact.  Unchanged 2-10  Assessment/Plan: 1. Functional deficits which require 3+ hours per day of interdisciplinary therapy in a comprehensive inpatient rehab setting. Physiatrist is providing close team supervision and 24 hour management of active medical problems listed below. Physiatrist and rehab team continue to assess barriers to discharge/monitor patient progress toward functional and medical goals  Care Tool:  Bathing    Body parts  bathed by patient: Right arm, Left arm, Chest, Abdomen, Face   Body parts bathed by helper: Front perineal area, Buttocks, Right upper leg, Left upper leg, Right lower leg, Left lower leg     Bathing assist Assist Level: Maximal Assistance - Patient 24 - 49%     Upper Body Dressing/Undressing Upper body dressing   What is the patient wearing?: Pull over shirt    Upper body assist Assist Level: Minimal Assistance - Patient > 75%    Lower Body Dressing/Undressing Lower body dressing      What is the patient wearing?: Incontinence brief, Pants     Lower body assist Assist for lower body  dressing: Maximal Assistance - Patient 25 - 49%     Toileting Toileting    Toileting assist Assist for toileting: Moderate Assistance - Patient 50 - 74%     Transfers Chair/bed transfer  Transfers assist  Chair/bed transfer activity did not occur: Safety/medical concerns (unsafe to get up)  Chair/bed transfer assist level: Contact Guard/Touching assist     Locomotion Ambulation   Ambulation assist   Ambulation activity did not occur: Safety/medical concerns  Assist level: Contact Guard/Touching assist Assistive device: Walker-rolling Max distance: 200   Walk 10 feet activity   Assist  Walk 10 feet activity did not occur: Safety/medical concerns  Assist level: Contact Guard/Touching assist Assistive device: Walker-rolling   Walk 50 feet activity   Assist Walk 50 feet with 2 turns activity did not occur: Safety/medical concerns  Assist level: Contact Guard/Touching assist Assistive device: Walker-rolling    Walk 150 feet activity   Assist Walk 150 feet activity did not occur: Safety/medical concerns  Assist level: Contact Guard/Touching assist Assistive device: Walker-rolling    Walk 10 feet on uneven surface  activity   Assist Walk 10 feet on uneven surfaces activity did not occur: Safety/medical concerns         Wheelchair     Assist Is the patient using a wheelchair?: Yes Type of Wheelchair: Manual    Wheelchair assist level: Dependent - Patient 0%      Wheelchair 50 feet with 2 turns activity    Assist        Assist Level: Dependent - Patient 0%   Wheelchair 150 feet activity     Assist      Assist Level: Dependent - Patient 0%   Blood pressure (!) 176/87, pulse (!) 59, temperature 97.9 F (36.6 C), resp. rate 18, height 5' 7.99" (1.727 m), weight 102.7 kg, SpO2 98%. Medical Problem List and Plan: 1. Functional deficits secondary to debility from GI bleed/delirium             -patient may  shower- cover skin  tears             -ELOS/Goals: 12-14 days min A to supervision- 2-11 discharge date           -Continue CIR therapies including PT, OT, and SLP   2.  Antithrombotics: -DVT/anticoagulation:  Pharmaceutical: Other (comment)-_Pradaxa resumed on 01/21              -antiplatelet therapy: N/A 3. Pain Management: Tylenol prn.  4. Mood/Behavior/Sleep/delirium: LCSW to follow for evaluation and support.              -antipsychotic agents: N/A--did not tolerate Seroquel    --Monitor sleep wake cycle. -Reminded nursing to add sleep log 1-28:  Did poorly with scheduled nightly Seroquel on inpatient due to daytime somnolence.  Add mirtazapine 15  mg nightly, keep melatonin 5 mg as needed. 1-29: Reminded nursing to apply sleep log.  Added as needed trazodone 50 mg. 1-30: Slept intermittently 1 to 2 hours for a total of 4 hours overnight. start scheduled trazodone 50 mg nightly.  Encourage sleep, wake cycle. Encouraged family to promote compliance with medications and sleep/wake cycle. Did discontinue Protonix per family request, and Atarax as needed due to sedating effects. -Family notes that at baseline, patient does not sleep well at night, naps often during the day 1-31: Sleep log not updated, patient endorsing poor sleep.  Also endorsing significant symptoms of depression with therapies.  increase trazodone to 100 mg--No SI/HI, so will not involve psychiatry at this time. 2/2 sleep disturbed by frequent urination but otherwise no complaints 2-3: Sleep improved per log.  Patient agreeable to starting SSRI, fluoxetine 20 mg daily ordered for activating effects. 2/4: Tearful with PT yesterday; would benefit from neuropsych consult.  improved sleep with trazodone 100 mg..  Unfortunately, we will need to DC Prozac due to interaction with Pradaxa and hemoglobinuria. 2/5: family updated; agree best thing for mood moving forward is engagement with therapies and discharge home as soon as safe 2-7: Due to complaints  of daytime lethargy, reduce trazodone to 50 mg.  Is sleeping well overnight per sleep log. 2/9 appears more alert this weekend but now focused on pain 2-10: Much more awake, alert, and pleasant today.  Excited regarding discharge tomorrow.  5. Neuropsych/cognition: This patient is not capable of making decisions on his own behalf.   -1-28: Adding telemonitoring, 4 side rails for safety.  Add Zyprexa 5 mg sublingual versus IM as needed for agitation, given patient is spitting most medication.  Restraints as above. --Have not required as needed Zyprexa  1/31: Overall, cognition seems to be improving a little today, with less preoccupation/distraction--continue to improve over the weekend 2-3  2/6: Neuropsych evaluation completed, patient was withdrawn/minimally interactive throughout--discussed with Dr. Kieth Brightly 2-7, feel making gains in mood and engagement will be challenging moving forward given patient's goals/personality.    6. Skin/Wound Care: Routine pressure relief measures.    -Monitor of skin tears, bruises.   - Cover areas of skin tears with Mepilex dressings to avoid compulsive picking  2-4: Asked nursing to change left lower extremity bandage and provide new images for chart due to pulling away - looks changed, clean 2/5  7. Fluids/Electrolytes/Nutrition: Monitor I/O check CMET in am   - 1/27: See below; 500 cc IVF today, encourage PO, recheck BMP in AM. Add calcium supplement  -1-28: Labs improved, encourage p.o.'s.  1-30: AKI again today, with poor p.o. intakes.  Initiate IV fluids, repeat BMP in AM.  Mag low, give 4 g IV repletion, repeat in AM.  1/31: Magnesium repleted.  Management of AKI as below.  P.o. intakes adequate  2/3: Magnesium 1.7, within normal limits.  Potassium 4.6.  2/8 follow up labs Monday   2-10: Minimal p.o. fluid intakes, had IV fluid yesterday but only got about 150 to 200 cc.  See below, will give 1 L IV fluid today and repeat labs this p.m.  8.  GIB/Duodenal AVM s/p APC:  To continue PPI BID X 4 weeks-->10/26/23   - HgB stable; see #18  -1-30: Family insistent on discontinuation of Protonix due to possible side effects of cognitive deficits/ parkinsonism; understand this is a very unlikely contributor, discussed risks versus benefits with recent GI bleed, they continue to request removal of medication.  -1/31:  Protonix Dced;  will start famotidine 20 mg twice daily as alternative. Add CBC in AM, FOBT.  -2-3: Hemoglobin stable.  Large bowel movement this a.m. will DC FOBT.    - Stable on current medications  2-10: DC Pepcid due to AKI as above.  Hemoglobin remained stable.   9. Right hemothorax s/p CT/fibrinolysis: Has been weaned off oxygen             --follow up with Dr. Irena Cords in 2 months w/repeat CT chest   - Follow-up chest x-ray without ongoing findings.  10. Delirium/intermittent movement d/o: Improved/resolved  11. PAF/SVT/HTN: HR improved with titrated of coreg to 12.5 mg bid.  -1/29: Continues intermittently refusing medications, heart rate well-controlled, blood pressure rising.  Resume home olmesartan at low-dose 5 mg; substituted for formulary Avapro 37.5 mg daily.  Add hydralazine 10 mg every 8 hours as needed for systolic blood pressure greater than 180, diastolic greater than 110  1-30: Blood pressure remains elevated, but he only just stated taking his medications today.  Family to encourage compliance, BNP today downtrending from prior so unlikely heart failure exacerbation.  2/3 BUN/creatinine improving, is now taking medications consistently, increase Avapro to 75 mg daily to approximate olmesartan 10 mg - start 2/4  2/5: Some mild hypotension yesterday, hypertension overnight, will monitor today for any symptomatic orthostasis.  2-6: Some increasing peripheral edema, blood pressures are stable.  Will recommend Ace wrapping right lower extremity, due to limitations of left lower extremity due to skin tear.  2/7:  Reduce metoprolol to 6.25 BID due to HR 40s overnight. DC flomax as below.   2/9 HR only dropping into 50's--continue current dosing  2/10: Hypertensive overnight, with worsening AKI; likely contractive, will give gentle IVF today 75 cc/hr for     2/10: Cr increased again, DC mybetriq and Pepcid.  BNP/troponins minimally elevated 2-9, will check BNP again today (approximately stable) last echo 12/28 with normal EF< mildly reduced R systolic function.      11/08/2023    8:27 PM 11/08/2023   12:57 PM 11/08/2023    3:44 AM  Vitals with BMI  Systolic 148 124 308  Diastolic 70 66 87  Pulse 68 70 59     12.  Hypokalemia/Hypomagnesemia/hypocalcemia: K+ 3.1 this AM- was given KCL- will recheck in AM   - 1/27: K stable/improved; will start Oscal 500 mg BID for Ca 7.8  1-28: K3.3, start 20 mill equivalents potassium twice daily.  Retest later this week.  1-30: Potassium unchanged, resume twice daily repletion.  4 g IV mag as above Calcium repleted  2-3: Potassium repleted.  Continue current regimen.  Magnesium added on to a.m. labs late--resolved  13. HCAP: Treated with 7 day course of Zosyn   - No respiratory distress  -1-30: Chest x-ray today--no acute intrapulmonary process.  Pleural effusions resolved.  14. Gross hematuria/dysuria/incontinence/hypogastric pain: to follow up with Dr. Annabell Howells on outpatient basis. On flomax 0.4 mg daily.   -1-29: High proteinuria and some blood in urinalysis; negative for infection.  Consistent with prior urinalysis  2-4: Recurrent.  Was micro hemoglobinuria on 2-1, CT abdomen and pelvis was canceled as below.  Will get new urinalysis/culture today, renal ultrasound to evaluate, DC Prozac as above due to potential bleed risk with Pradaxa.Patient reporting possible urethral injury while inpatient contributing.   2/5: Renal ultrasound yesterday showing solid region within the bladder 1.3 x 1.1 x 1.4 cm; blood clot versus mass without positive Doppler.  Also found  nonobstructing calculus 1.5 cm  in left lower renal pelvis, along with left renal cysts.  Hematuria improving with discontinuation of Prozac; can continue conservative management and follow-up outpatient with urology Dr. Broadus John as previously planned by internal medicine.   - Unfortunately, cannot initiate Pyridium due to CKD, will add Robaxin 500 mg every 8 hours as needed. 2/6: Low PVRs, improved appearance of urine.  Continue conservative management and outpatient follow-up with Dr. Annabell Howells as previously planned 2/7: DC flomax due to ongoing incontinence with low PVRs -monitor PVRs over this weekend to ensure no retention  2/8 pt still with urinary frequency and a lot of discomfort.   -off flomax, voiding completely it appears, recent ucx neg  -will try myrbetriq and observe for response this weekend,  -oxycodone for pain   2/9 pt with ongoing dysuria, hypogastric discomfort   -increase myrbetriq to 50mg    -repeat ucx today, check kub   -may need urology consult tomorrow if no improvement    - 2-10: Had single ISC for retention greater than 500 yesterday afternoon; DC Myrbetriq.  Continue PVRs.  Urine culture with small bacteria, no leukocytes or nitrates, remains large hemoglobin (likely from clot and multiple traumatic caths ) and protein with casts (consistent with priors).  KUB unremarkable.  15. T2DM: Hgb A1c- 5.5. Diet controlled at this time             --has been off actos.   16. Acute on chronic renal failure -CKD stage IIIb:  BUN/SCr improving. Baseline per OP records appear 1.2-1.5.  --Followed by Dr. Zetta Bills w/plan for renal bx prior to admission.              --encourage fluid intake- Cr 1.33 this AM up from 1.0- will recheck in Ama nd push fluids.   1/27: Continues uptrending to 1.5 today; Is only 240 yesterday; Push PO fluids and give 500 cc bolus today, check in AM  1-28: Creatinine improved to 1.3.  P.o. intakes remain poor, will continue daily monitoring and bolus as  needed  1-30: Resume IV fluids today for AKI.  BMP in AM.  1/31: Received 500 cc overnight before pulling IV, creatinine uptrending.  Getting CT abdomen and pelvis today due to concern for renal stone as above.  2-3: Creatinine improved back to 1.5; increasing ACE inhibitor as above.  2/7: BUN, cr mildly increased, 27/1.69 - encourage PO fluids, recheck Monday  2/9 BUN increased to 32-   -will give 500 cc iv 1/2ns today   -recheck in AM  2/10: Cr increased again, DC mybetriq and Pepcid due to worsening AKI; will give 1 L IV fluid today normal saline at 75 cc/h given stable BNP.  Repeat labs this p.m. with increasing creatinine, however had not gotten much fluid at that time.  17. Hx Pancytopenia:  Bone bx w/ ICUS (brewing myelodysplasia) per Dr. Leonides Schanz.    - 1/27: WBC down slightly; monitor--stable 2-3  18. Abdominal pain/nausea    - KUB without concerning findings 1/27   - Zofran PRN added   - on protonix 40 mg BID--DC'd per family request, changed to famotidine 20 mg resolved 2/1  2-1: Abdominal pain, abdominal exam is normal, discontinue CT abdomen, would be concerned about worsening renal status with Omnipaque - 2/4: multiple Bms overnight, mixed continence - 2-5: Likely due to urethral injury during inpatient cathing as above, likely small clot found in the bladder on ultrasound.  Aqua thermia pad over abdomen, Tylenol, and as needed Robaxin for pain relief.  Cannot  do NSAIDs due to recent GI bleed. 2/9 continue with plan above as well as in #14   2-10: With ongoing hemoglobin, worsening AKI, and abdominal pain, differential remains now obstructive urinary stone.  IV fluid as above, repeat labs in a.m., outpatient urology follow-up.  LOS: 15 days A FACE TO FACE EVALUATION WAS PERFORMED  Angelina Sheriff 11/08/2023, 8:10 AM

## 2023-11-08 NOTE — Plan of Care (Signed)
  Problem: Consults Goal: RH GENERAL PATIENT EDUCATION Description: See Patient Education module for education specifics. Outcome: Progressing   Problem: RH BOWEL ELIMINATION Goal: RH STG MANAGE BOWEL WITH ASSISTANCE Description: STG Manage Bowel with minimal assistance Assistance. Outcome: Progressing   Problem: RH BLADDER ELIMINATION Goal: RH STG MANAGE BLADDER WITH ASSISTANCE Description: STG Manage Bladder With minimal  Assistance Outcome: Progressing   Problem: RH SKIN INTEGRITY Goal: RH STG SKIN FREE OF INFECTION/BREAKDOWN Description: Manage skin infection / breakdown with educational materials with minimal assistance Outcome: Progressing

## 2023-11-09 ENCOUNTER — Inpatient Hospital Stay (HOSPITAL_COMMUNITY): Payer: Medicare HMO

## 2023-11-09 DIAGNOSIS — K921 Melena: Secondary | ICD-10-CM

## 2023-11-09 LAB — BASIC METABOLIC PANEL
Anion gap: 17 — ABNORMAL HIGH (ref 5–15)
BUN: 35 mg/dL — ABNORMAL HIGH (ref 8–23)
CO2: 19 mmol/L — ABNORMAL LOW (ref 22–32)
Calcium: 9.2 mg/dL (ref 8.9–10.3)
Chloride: 102 mmol/L (ref 98–111)
Creatinine, Ser: 3.15 mg/dL — ABNORMAL HIGH (ref 0.61–1.24)
GFR, Estimated: 20 mL/min — ABNORMAL LOW (ref >60)
Glucose, Bld: 92 mg/dL (ref 70–99)
Potassium: 3.9 mmol/L (ref 3.5–5.1)
Sodium: 138 mmol/L (ref 135–145)

## 2023-11-09 LAB — AMMONIA: Ammonia: 38 umol/L — ABNORMAL HIGH (ref 9–35)

## 2023-11-09 MED ORDER — DABIGATRAN ETEXILATE MESYLATE 75 MG PO CAPS
75.0000 mg | ORAL_CAPSULE | Freq: Two times a day (BID) | ORAL | Status: DC
  Administered 2023-11-09 – 2023-11-10 (×2): 75 mg via ORAL
  Filled 2023-11-09 (×2): qty 1

## 2023-11-09 MED ORDER — LACTATED RINGERS IV SOLN: INTRAVENOUS | Status: AC

## 2023-11-09 NOTE — Patient Care Conference (Signed)
Inpatient RehabilitationTeam Conference and Plan of Care Update Date: 11/10/2023   Time: 1037 am    Patient Name: Joshua [name]      Medical Record Number: [id number]  Date of Birth: 04-02-1951 Sex: Male         Room/Bed: 4W08C/4W08C-01 Payor Info: Payor: [employer] MEDICARE / Plan: [employer] MEDICARE HMO/PPO / Product Type: *No Product type* /    Admit Date/Time:  10/24/2023  1:08 PM  Primary Diagnosis:  Debility  Hospital Problems: Principal Problem:   Debility Active Problems:   Gastrointestinal hemorrhage with melena   Cognitive and neurobehavioral dysfunction    Expected Discharge Date: Expected Discharge Date:  (medical hold)  Team Members Present: Physician leading conference: Dr. Elijah Birk Social Worker Present: Cecile Sheerer, LCSWA Nurse Present: Chana Bode, RN;Zani Kyllonen Shirlee Latch, RN PT Present: Darrold Span, PT OT Present: Jake Shark, OT SLP Present: Other (comment) Catha Nottingham) PPS Coordinator present : Fae Pippin, SLP     Current Status/Progress Goal Weekly Team Focus  Bowel/Bladder   incontinent of b/b, LBM 2/9   regain continence   q 4 hr toileting and PRN    Swallow/Nutrition/ Hydration               ADL's   SBA LB, UB, grooming SBA stand pvt with RW   Supervision   Ready for discharge from OT standpoint with all goals met.    Mobility   supervision overall, gait >250'   supervision overall ambulatory; CGA for stairs  barriers: endurance; focus on dc planning, stairs, strength/endurance    Communication                Safety/Cognition/ Behavioral Observations  supervision for all funcitonal cognitive tasks            Pain   no current complaints of pain   pain less 3   assess pain q 4 hr and prn    Skin   several abrasions from skin tears and picking at skin   keep wounds clean and dry  assess skin q shift and prn      Discharge Planning:  Pt will discharge to home with wife as primary caregiver. Pt needs to be  able to get up and ambulate on his own. PRN support from his children. [facility]- [facility] for HHPT/OT/SLP/aide. Fam edu completed. SW will confirm there are no barriers to discharge.   Team Discussion: Patient admitted post GI bleed delirium with debility. Patient has  worsening creatinine levels,  kidney function levels requiring repeat testing. Patient  has abdominal pain ; medication adjusted.  Patient on target to meet rehab goals: yes, Patient requires supervision with transfers.   *See Care Plan and progress notes for long and short-term goals.   Revisions to Treatment Plan:  N/a  Teaching Needs:  Medications, transfers, safety, toileting, etc.  Current Barriers to Discharge: Decreased caregiver support and Incontinence  Possible Resolutions to Barriers: Family education Home health : HHA/PT/OT/SLP     Medical Summary Current Status: medicatlly complicated by AKI on CKD, abdominal pain, incontinence, heart failure, delirium/depression  Barriers to Discharge: Behavior/Mood;Cardiac Complications;Electrolyte abnormality;Medical stability;Incontinence;Morbid Obesity;Uncontrolled Hypertension;Renal Insufficiency/Failure;Self-care education;Uncontrolled Pain   Possible Resolutions to [employer] Focus: Nephrology consulted for recurrent/worsening AKI, adjust medications as needed for blood pressure control, timed toileting and adjusting bowel medications, abdominal imaging for pain   Continued Need for Acute Rehabilitation Level of Care: The patient requires daily medical management by a physician with specialized training in physical medicine and rehabilitation for the  following reasons: Direction of a multidisciplinary physical rehabilitation program to maximize functional independence : Yes Medical management of patient stability for increased activity during participation in an intensive rehabilitation regime.: Yes Analysis of laboratory values and/or radiology reports with any  subsequent need for medication adjustment and/or medical intervention. : Yes   I attest that I was present, lead the team conference, and concur with the assessment and plan of the team.   [name] 11/10/2023, 7:37 AM

## 2023-11-09 NOTE — Plan of Care (Signed)
  Problem: RH Stairs Goal: LTG Patient will ambulate up and down stairs w/assist (PT) Description: LTG: Patient will ambulate up and down # of stairs with assistance (PT) Flowsheets Taken 11/09/2023 1039 LTG: Pt will ambulate up/down stairs assist needed:: Supervision/Verbal cueing Taken 10/25/2023 1238 LTG: Pt will  ambulate up and down number of stairs: 4 Note: With unilateral HR to simulate home environment

## 2023-11-09 NOTE — Consult Note (Addendum)
Reason for Consult:AKI on CKD3 Referring Physician: Dr. Doran [name] is an 73 y.o. male.  HPI: He was initially admitted for a GI bleed due to two gastric AVMs that were treated with APC. He was then admitted to inpatient rehab for debility and delirium after the hospitalization. Over the course of his rehab stay, he has slowly developed increasing creatinine in the setting of decreased PO intake. He also tells me this morning that he is having left lower abdominal pain which is persistent from his initial presentation. His daughter is in the room, and she mentions that in terms of his urination, he will sometimes go a couple voids without hematuria. It appears that he has been worked up for this hematuria in the past at which point a cystoscopy was recommended for further evaluation of a bladder mass, but he declined. Patient states he feels like this was competed, but I am unable to see results. After discussion with Dr. Shearon Stalls, seems patient has been having episodes of incontinence overnight, making it difficult to measure UOP.  Trend in Creatinine: Creatinine  Date/Time Value Ref Range Status  03/18/2023 10:26 AM 1.34 (H) 0.61 - 1.24 mg/dL Final  16/06/9603 54:09 AM 1.49 (H) 0.61 - 1.24 mg/dL Final  81/19/1478 29:56 PM 1.76 (H) 0.61 - 1.24 mg/dL Final   Creatinine, Ser  Date/Time Value Ref Range Status  11/09/2023 05:07 AM 3.15 (H) 0.61 - 1.24 mg/dL Final  21/30/8657 84:69 PM 2.81 (H) 0.61 - 1.24 mg/dL Final  62/95/2841 32:44 AM 2.01 (H) 0.61 - 1.24 mg/dL Final  09/30/7251 66:44 AM 1.51 (H) 0.61 - 1.24 mg/dL Final  03/47/4259 56:38 AM 1.69 (H) 0.61 - 1.24 mg/dL Final  75/64/3329 51:88 AM 1.53 (H) 0.61 - 1.24 mg/dL Final  41/66/0630 16:01 AM 1.87 (H) 0.61 - 1.24 mg/dL Final  09/32/3557 32:20 AM 1.84 (H) 0.61 - 1.24 mg/dL Final  25/42/7062 37:62 AM 1.57 (H) 0.61 - 1.24 mg/dL Final  83/15/1761 60:73 AM 1.31 (H) 0.61 - 1.24 mg/dL Final  71/02/2693 85:46 AM 1.54 (H) 0.61 - 1.24  mg/dL Final  27/11/5007 38:18 AM 1.33 (H) 0.61 - 1.24 mg/dL Final  29/93/7169 67:89 AM 1.10 0.61 - 1.24 mg/dL Final  38/06/1750 02:58 AM 1.29 (H) 0.61 - 1.24 mg/dL Final  52/77/8242 35:36 AM 1.31 (H) 0.61 - 1.24 mg/dL Final  14/43/1540 08:67 AM 1.18 0.61 - 1.24 mg/dL Final  61/95/0932 67:12 AM 1.16 0.61 - 1.24 mg/dL Final  45/80/9983 38:25 AM 1.21 0.61 - 1.24 mg/dL Final  05/39/7673 41:93 AM 1.25 (H) 0.61 - 1.24 mg/dL Final  79/10/4095 35:32 AM 1.37 (H) 0.61 - 1.24 mg/dL Final  99/24/2683 41:96 AM 1.73 (H) 0.61 - 1.24 mg/dL Final  22/29/7989 21:19 PM 1.07 0.61 - 1.24 mg/dL Final  41/74/0814 48:18 AM 1.42 (H) 0.61 - 1.24 mg/dL Final  56/31/4970 26:37 AM 1.34 (H) 0.61 - 1.24 mg/dL Final  85/88/5027 74:12 AM 1.49 (H) 0.61 - 1.24 mg/dL Final  87/86/7672 09:47 AM 1.27 (H) 0.61 - 1.24 mg/dL Final  09/62/8366 29:47 AM 1.45 (H) 0.61 - 1.24 mg/dL Final  65/46/5035 46:56 AM 1.69 (H) 0.61 - 1.24 mg/dL Final  81/27/5170 01:74 AM 1.75 (H) 0.61 - 1.24 mg/dL Final  94/49/6759 16:38 AM 2.03 (H) 0.61 - 1.24 mg/dL Final  46/65/9935 70:17 AM 2.09 (H) 0.61 - 1.24 mg/dL Final  79/39/0300 92:33 AM 2.17 (H) 0.61 - 1.24 mg/dL Final  00/76/2263 33:54 AM 2.60 (H) 0.61 - 1.24 mg/dL Final  56/25/6389 37:34 AM  3.35 (H) 0.61 - 1.24 mg/dL Final  16/06/9603 54:09 AM 3.98 (H) 0.61 - 1.24 mg/dL Final  81/19/1478 29:56 PM 4.45 (H) 0.61 - 1.24 mg/dL Final  21/30/8657 84:69 AM 1.41 (H) 0.76 - 1.27 mg/dL Final  62/95/2841 32:44 AM 1.29 (H) 0.61 - 1.24 mg/dL Final  09/30/7251 66:44 PM 1.28 (H) 0.61 - 1.24 mg/dL Final  03/47/4259 56:38 AM 1.2 0.4 - 1.5 mg/dL Final  75/64/3329 51:88 PM 1.3 0.4 - 1.5 mg/dL Final  41/66/0630 16:01 PM 1.24 0.4 - 1.5 mg/dL Final  09/32/3557 32:20 PM 1.34 0.4 - 1.5 mg/dL Final  25/42/7062 37:62 PM 1.3 0.4 - 1.5 mg/dL Final  83/15/1761 60:73 AM 1.5 0.4 - 1.5 mg/dL Final  71/02/2693 85:46 PM 1.5 0.4 - 1.5 mg/dL Final  27/11/5007 38:18 PM 1.5 0.4 - 1.5 mg/dL Final  29/93/7169 67:89 PM 1.5 0.4 -  1.5 mg/dL Final  38/06/1750 02:58 AM 1.19  Final  11/05/2007 04:10 AM 1.17  Final  11/04/2007 10:55 PM 1.31  Final    PMH:   Past Medical History:  Diagnosis Date   Arthritis    Atrial fibrillation (HCC)    Atrial flutter (HCC)    Back pain    CAD (coronary artery disease)    stents placed 2009   Cholelithiasis    Complication of anesthesia    woke up during surgery and ablation   DM (diabetes mellitus) (HCC)    type 2   Elevated PSA    Gout    Hearing problem    HTN (hypertension)    Hyperlipidemia    Myocardial infarct, old 2010   Prostatitis    Renal insufficiency     PSH:   Past Surgical History:  Procedure Laterality Date   CARDIAC CATHETERIZATION  11/07/07   with 2 stents   COLONOSCOPY     Electrophysiologic study and RF catheter ablation   of AV node reentrant tachycardia.     ESOPHAGOGASTRODUODENOSCOPY (EGD) WITH PROPOFOL N/A 09/30/2023   Procedure: ESOPHAGOGASTRODUODENOSCOPY (EGD) WITH PROPOFOL;  Surgeon: Lynann Bologna, MD;  Location: [facility];  Service: Gastroenterology;  Laterality: N/A;   EXTERNAL EAR SURGERY Right    cartilage   HOT HEMOSTASIS N/A 09/30/2023   Procedure: HOT HEMOSTASIS (ARGON PLASMA COAGULATION/BICAP);  Surgeon: Lynann Bologna, MD;  Location: [facility];  Service: Gastroenterology;  Laterality: N/A;   LEFT AND RIGHT HEART CATHETERIZATION WITH CORONARY/GRAFT ANGIOGRAM N/A 09/27/2014   Procedure: LEFT AND RIGHT HEART CATHETERIZATION WITH Isabel Caprice;  Surgeon: Lesleigh Noe, MD;  Location: [facility] CATH LAB;  Service: Cardiovascular;  Laterality: N/A;   LUMBAR LAMINECTOMY/DECOMPRESSION MICRODISCECTOMY Left 04/29/2018   Procedure: Left Lumbar Four-Five Laminectomy/Foraminotomy;  Surgeon: Tia Alert, MD;  Location: [facility] OR;  Service: Neurosurgery;  Laterality: Left;  Left Lumbar Four-Five Laminectomy/Foraminotomy   TOOTH EXTRACTION     VASECTOMY      Allergies:  Allergies  Allergen Reactions   Desipramine Hives and Itching    Niacin Itching    Medications:   Prior to Admission medications   Medication Sig Start Date End Date Taking? Authorizing Provider  carvedilol (COREG) 12.5 MG tablet TAKE 1 TABLET BY MOUTH TWICE A DAY 06/14/23   Marinus Maw, MD  fenofibrate 160 MG tablet Take 160 mg by mouth daily. 06/16/20   [provider]  folic acid (FOLVITE) 1 MG tablet Take 1 tablet (1 mg total) by mouth daily. 10/25/23   Danford, Earl Lites, MD  ipratropium (ATROVENT) 0.06 % nasal spray Place 1 spray  into both nostrils 3 (three) times daily as needed for rhinitis. 05/29/19   [provider]  isosorbide mononitrate (IMDUR) 30 MG 24 hr tablet Take 30 mg by mouth daily. 03/11/23   [provider]  nitroGLYCERIN (NITROSTAT) 0.4 MG SL tablet Place 0.4 mg under the tongue every 5 (five) minutes x 3 doses as needed for chest pain.    [provider]  olmesartan (BENICAR) 40 MG tablet Take 1 tablet (40 mg total) by mouth daily. 04/27/23   Marinus Maw, MD  pantoprazole (PROTONIX) 40 MG tablet Take 1 tablet (40 mg total) by mouth 2 (two) times daily before a meal. 10/24/23 10/23/24  Danford, Earl Lites, MD  pioglitazone (ACTOS) 15 MG tablet Take 15 mg by mouth daily.    [provider]  PRADAXA 150 MG CAPS capsule TAKE 1 CAPSULE BY MOUTH TWICE A DAY 10/04/23   Marinus Maw, MD  rosuvastatin (CRESTOR) 20 MG tablet Take 20 mg by mouth daily. 03/30/20   [provider]  tamsulosin (FLOMAX) 0.4 MG CAPS capsule Take 1 capsule (0.4 mg total) by mouth daily. 10/25/23   Danford, Earl Lites, MD  TYLENOL 8 HOUR ARTHRITIS PAIN 650 MG CR tablet Take 650-1,300 mg by mouth every 8 (eight) hours as needed for pain.    [provider]  vitamin B-12 (CYANOCOBALAMIN) 500 MCG tablet Take 500 mcg by mouth daily.    [provider]    Inpatient medications:  acetaminophen  650 mg Oral TID WC & HS   calcium carbonate  500 mg of elemental calcium Oral BID WC   carvedilol   6.25 mg Oral BID WC   dabigatran  150 mg Oral Q12H   feeding supplement  1 Container Oral BID BM   folic acid  1 mg Oral Daily   irbesartan  75 mg Oral Daily   isosorbide mononitrate  30 mg Oral Daily   melatonin  5 mg Oral QHS   rosuvastatin  20 mg Oral Daily   simethicone  80 mg Oral QID   traZODone  50 mg Oral QHS    Discontinued Meds:   Medications Discontinued During This Encounter  Medication Reason   folic acid (FOLVITE) tablet 1 mg    tamsulosin (FLOMAX) capsule 0.4 mg    prochlorperazine (COMPAZINE) suppository 12.5 mg    prochlorperazine (COMPAZINE) injection 5-10 mg    prochlorperazine (COMPAZINE) tablet 5-10 mg    acetaminophen (TYLENOL) tablet 325-650 mg    prochlorperazine (COMPAZINE) tablet 5-10 mg    QUEtiapine (SEROQUEL) tablet 50 mg    melatonin tablet 5 mg    pantoprazole (PROTONIX) EC tablet 40 mg    diphenhydrAMINE (BENADRYL) capsule 25 mg    hydrOXYzine (ATARAX) tablet 10 mg    mirtazapine (REMERON) tablet 15 mg    traZODone (DESYREL) tablet 50 mg    potassium chloride (KLOR-CON) packet 20 mEq    traZODone (DESYREL) tablet 50 mg    irbesartan (AVAPRO) tablet 37.5 mg    traZODone (DESYREL) tablet 100 mg    FLUoxetine (PROZAC) capsule 20 mg    traZODone (DESYREL) tablet 50 mg    OLANZapine zydis (ZYPREXA) disintegrating tablet 5 mg    OLANZapine (ZYPREXA) injection 5 mg    carvedilol (COREG) tablet 12.5 mg    milk and molasses enema    tamsulosin (FLOMAX) capsule 0.4 mg    ALPRAZolam (XANAX) tablet 0.5 mg    traZODone (DESYREL) tablet 100 mg    calcium carbonate (OS-CAL -  dosed in mg of elemental calcium) tablet 2,500 mg    mirabegron ER (MYRBETRIQ) tablet 25 mg    0.45 % sodium chloride infusion    mirabegron ER (MYRBETRIQ) tablet 50 mg    0.9 %  sodium chloride infusion    famotidine (PEPCID) tablet 20 mg    methocarbamol (ROBAXIN) tablet 500 mg     Social History:  reports that he has never smoked. He has quit using smokeless tobacco.  His  smokeless tobacco use included chew. He reports that he does not drink alcohol and does not use drugs.  Family History:   Family History  Problem Relation Age of Onset   Hypertension Mother    Gout Mother    Alzheimer's disease Mother    Heart failure Father    Heart disease Brother    Cancer Brother        type unknown    Pertinent items are noted in HPI. Weight change:   Intake/Output Summary (Last 24 hours) at 11/09/2023 0839 Last data filed at 11/08/2023 1500 Gross per 24 hour  Intake 201.13 ml  Output --  Net 201.13 ml   BP (!) 166/75 (BP Location: Left Arm)   Pulse 62   Temp 98.1 F (36.7 C) (Oral)   Resp 18   Ht 5' 7.99" (1.727 m)   Wt 102.7 kg   SpO2 96%   BMI 34.43 kg/m  Vitals:   11/08/23 0344 11/08/23 1257 11/08/23 2027 11/09/23 0513  BP: (!) 176/87 124/66 (!) 148/70 (!) 166/75  Pulse: (!) 59 70 68 62  Resp: 18 17 18 18   Temp: 97.9 F (36.6 C) (!) 97.5 F (36.4 C) 98.4 F (36.9 C) 98.1 F (36.7 C)  TempSrc:  Oral Oral Oral  SpO2: 98% 97% 96% 96%  Weight:      Height:         General appearance: Lying in bed, cooperative, no distress Resp: clear to auscultation bilaterally Cardio: regular rate and rhythm, S1, S2 normal, no murmur, click, rub or gallop GI: Diffusely tender to light palpation, left lower quadrant more firm and almost indurated when compared to right side, bowel sounds hyperactive Extremities: no edema to bilateral lower extremities, erythema with chronic skin changes Neurologic: Intermittently alert when being spoken to, fully oriented, mentation appears slowed, no asterixis  Labs: Basic Metabolic Panel: Recent Labs  Lab 11/04/23 0537 11/07/23 0040 11/08/23 0535 11/08/23 1931 11/09/23 0507  NA 140 136 139 136 138  K 4.4 4.3 4.5 4.7 3.9  CL 106 105 102 103 102  CO2 22 22 17* 20* 19*  GLUCOSE 93 104* 108* 96 92  BUN 27* 32* 34* 35* 35*  CREATININE 1.69* 1.51* 2.01* 2.81* 3.15*  CALCIUM 9.0 8.6* 9.8 9.0 9.2   Liver Function  Tests: No results for input(s): "AST", "ALT", "ALKPHOS", "BILITOT", "PROT", "ALBUMIN" in the last 168 hours. No results for input(s): "LIPASE", "AMYLASE" in the last 168 hours. No results for input(s): "AMMONIA" in the last 168 hours. CBC: Recent Labs  Lab 11/04/23 0537 11/07/23 0040 11/08/23 0535  WBC 3.3* 3.4* 3.4*  HGB 8.5* 8.2* 10.0*  HCT 27.6* 26.4* 31.8*  MCV 99.3 98.5 98.5  PLT 115* 115* 126*   PT/INR: @LABRCNTIP (inr:5) Cardiac Enzymes: )No results for input(s): "CKTOTAL", "CKMB", "CKMBINDEX", "TROPONINI" in the last 168 hours. CBG: No results for input(s): "GLUCAP" in the last 168 hours.  Iron Studies: No results for input(s): "IRON", "TIBC", "TRANSFERRIN", "FERRITIN" in the last 168 hours.  Xrays/Other Studies:  DG Abd 1 View Result Date: 11/07/2023 CLINICAL DATA:  Abdominal pain EXAM: ABDOMEN - 1 VIEW COMPARISON:  10/25/2023 FINDINGS: Three supine frontal views of the abdomen and pelvis are obtained. No bowel obstruction or ileus. Moderate gaseous distention of the stomach. No masses or abnormal calcifications. Patchy right basilar consolidation and small right effusion. No acute bony abnormalities. IMPRESSION: 1. No bowel obstruction or ileus. 2. Patchy right basilar consolidation and small right effusion. Electronically Signed   By: Sharlet Salina M.D.   On: 11/07/2023 17:29    Assessment/Plan: AKI with hematuria on CKD3b: Baseline appears around 1.3-1.4. Cr steadily increasing up to 3.15 this AM. Likely pre-renal and hemodynamically mediated in the setting of decreased PO intake. Intake yesterday only charted at 200 mL with no UOP documented.  Will continue IV fluids at 100 mL/h for 12 hours and update BMP in AM. Also, urine culture negative, UA with large hemoglobin (chronic issue that at some point has been evaluated by urology though felt to be worsened due to traumatic cath; renal US had findings of a mass in the bladder which places malignancy/polyp on the differential,  and patient has declined cystoscopy in the past), >300 protein, rare bacteria, and mucus.  Will repeat UA today.  Also feel cystoscopy is warranted at some point in the future for further evaluation of bladder mass.  Renal US also without hydronephrosis which reduces chance of obstruction (though patient need intermittent straight catheterization over admission once, though this was felt to be due to [facility] which has since been discontinued). The [place] did demonstrate a non-obstructing 1.5 cm calculus in the lower left renal pelvis. He does not appear volume overloaded, and BNP normal for age with echo in 08/2023 demonstrating normal EF and diastolic function. He is on irbesartan, and could consider holding this while renal function improved. Hypokalemia, hypomagnesemia, hypocalcemia: Resolved. Monitor as clinically needed. Abdominal pain: Persistent over entire admission and diffuse on my exam.  Left lower quadrant feels more firm and indurated, and abdominal pain appears to be more painful at this area.  Will obtain abdominal ultrasound to further evaluate given decreased renal function.  KUB negative.  Per family had a large stool yesterday.  Will ensure we get strict I/O's.  Less likely due to renal stone given diffuse nature. Functional deficits 2/2 debility from GI bleed/delirium: Working with inpatient rehabilitation with improvement in functional status. PPI discontinued at family request. H2B held at this time due to worsening AKI.  No evidence of asterixis on exam today; however we will collect ammonia levels given persistent decrease in mental status. PAF/SVT/HTN: Per primary team. Continuing home medications.  Joshua [name] 11/09/2023, 8:39 AM

## 2023-11-09 NOTE — Progress Notes (Signed)
PROGRESS NOTE   Subjective/Complaints:  Patient got 1 L IVF per nursing (recorded incorrectly in Is and Os) with increasing Cr 2.01->2.8-> 3.15. PVRs low, is passing urine. Nephrology consulted for assistance, differential post-renal (?obstructive stone) vs medication induced (ARB, pepcid, myrbetriq) vs ?cardiorenal given CP and mildly elevated BNP, however last Echo with mild RV dysfunction and preserved EF.   Other than BUN/creatinine and bicarb, labs look approximately stable.  On evaluation, patient is in room, family at bedside.  Discussed the above the need to hold discharge today; patient is distraught saying "you cannot keep me here", family encouraged to stay until lab work improves.  All questions answered.  Of note, domino pain was prior in the right lower quadrant and groin area; today, with fullness and greatest tenderness in the left lower to mid quadrants.  No obvious bruising, is having bowel movements with good bowel sounds.  ROS: Patient denies fever, rash, sore throat, blurred vision, dizziness, nausea, vomiting, diarrhea, cough, shortness of breath or chest pain,  headache, or mood change.  + abdominal pain / dysuria--ongoing   Objective:   DG Abd 1 View Result Date: 11/07/2023 CLINICAL DATA:  Abdominal pain EXAM: ABDOMEN - 1 VIEW COMPARISON:  10/25/2023 FINDINGS: Three supine frontal views of the abdomen and pelvis are obtained. No bowel obstruction or ileus. Moderate gaseous distention of the stomach. No masses or abnormal calcifications. Patchy right basilar consolidation and small right effusion. No acute bony abnormalities. IMPRESSION: 1. No bowel obstruction or ileus. 2. Patchy right basilar consolidation and small right effusion. Electronically Signed   By: Sharlet Salina M.D.   On: 11/07/2023 17:29      Recent Labs    11/07/23 0040 11/08/23 0535  WBC 3.4* 3.4*  HGB 8.2* 10.0*  HCT 26.4* 31.8*  PLT 115*  126*   Recent Labs    11/08/23 1931 11/09/23 0507  NA 136 138  K 4.7 3.9  CL 103 102  CO2 20* 19*  GLUCOSE 96 92  BUN 35* 35*  CREATININE 2.81* 3.15*  CALCIUM 9.0 9.2    Intake/Output Summary (Last 24 hours) at 11/09/2023 0850 Last data filed at 11/08/2023 1500 Gross per 24 hour  Intake 201.13 ml  Output --  Net 201.13 ml        Physical Exam: Vital Signs Blood pressure (!) 166/75, pulse 62, temperature 98.1 F (36.7 C), temperature source Oral, resp. rate 18, height 5' 7.99" (1.727 m), weight 102.7 kg, SpO2 96%.  Physical Exam   PE: Constitutional: No distress . Vital signs reviewed.  Laying in bed. HEENT: NCAT, EOMI, oral membranes moist Neck: supple Cardiovascular: RRR without murmur. No JVD   .  Peripheral edema trace bilaterally.  Respiratory/Chest: CTA Bilaterally without wheezes or rales. Normal effort    GI/Abdomen: BS +, normoactive.  Tenderness to palpation has changed from right lower quadrant to left lower quadrant, with palpable fullness over that area and some guarding.  Ext: Bilateral lower extremity chronic skin changes, darkening Psych: pleasant and cooperative.  Somewhat depressed.  Skin: A lot of skin tears on UE"s and bruising Hemosiderin staining b/l calfs Base of wound appears red and beefy with mild serosanguineous drainage;  mild surrounding erythema without warmth. Covered in Mepilex dressing.    Neuro: AAOx 4, follows all simple commands.  No further cognitive delay.  No apparent neurologic deficits.  Sensory, motor, and cranial nerve exam intact  Assessment/Plan: 1. Functional deficits which require 3+ hours per day of interdisciplinary therapy in a comprehensive inpatient rehab setting. Physiatrist is providing close team supervision and 24 hour management of active medical problems listed below. Physiatrist and rehab team continue to assess barriers to discharge/monitor patient progress toward functional and medical goals  Care  Tool:  Bathing    Body parts bathed by patient: Right arm, Left arm, Chest, Abdomen, Front perineal area, Buttocks, Right upper leg, Left upper leg, Right lower leg, Left lower leg, Face   Body parts bathed by helper: Front perineal area, Buttocks, Right upper leg, Left upper leg, Right lower leg, Left lower leg     Bathing assist Assist Level: Supervision/Verbal cueing     Upper Body Dressing/Undressing Upper body dressing   What is the patient wearing?: Pull over shirt    Upper body assist Assist Level: Supervision/Verbal cueing    Lower Body Dressing/Undressing Lower body dressing      What is the patient wearing?: Incontinence brief, Pants     Lower body assist Assist for lower body dressing: Minimal Assistance - Patient > 75%     Toileting Toileting    Toileting assist Assist for toileting: Supervision/Verbal cueing     Transfers Chair/bed transfer  Transfers assist  Chair/bed transfer activity did not occur: Safety/medical concerns (unsafe to get up)  Chair/bed transfer assist level: Supervision/Verbal cueing     Locomotion Ambulation   Ambulation assist   Ambulation activity did not occur: Safety/medical concerns  Assist level: Supervision/Verbal cueing Assistive device: Walker-rolling Max distance: 350'   Walk 10 feet activity   Assist  Walk 10 feet activity did not occur: Safety/medical concerns  Assist level: Supervision/Verbal cueing Assistive device: Walker-rolling   Walk 50 feet activity   Assist Walk 50 feet with 2 turns activity did not occur: Safety/medical concerns  Assist level: Supervision/Verbal cueing Assistive device: Walker-rolling    Walk 150 feet activity   Assist Walk 150 feet activity did not occur: Safety/medical concerns  Assist level: Supervision/Verbal cueing Assistive device: Walker-rolling    Walk 10 feet on uneven surface  activity   Assist Walk 10 feet on uneven surfaces activity did not occur:  Safety/medical concerns   Assist level: Supervision/Verbal cueing Assistive device: Walker-rolling   Wheelchair     Assist Is the patient using a wheelchair?: No Type of Wheelchair: Manual    Wheelchair assist level: Dependent - Patient 0%      Wheelchair 50 feet with 2 turns activity    Assist        Assist Level: Dependent - Patient 0%   Wheelchair 150 feet activity     Assist      Assist Level: Dependent - Patient 0%   Blood pressure (!) 166/75, pulse 62, temperature 98.1 F (36.7 C), temperature source Oral, resp. rate 18, height 5' 7.99" (1.727 m), weight 102.7 kg, SpO2 96%. Medical Problem List and Plan: 1. Functional deficits secondary to debility from GI bleed/delirium             -patient may  shower- cover skin tears             -ELOS/Goals: 12-14 days min A to supervision- 2-11 discharge date           -Continue  CIR therapies including PT, OT, and SLP   2-11: Medical hold for discharge due to worsening AKI as below.  Nephrology consulted, workup pending  2.  Antithrombotics: -DVT/anticoagulation:  Pharmaceutical: Other (comment)-_Pradaxa resumed on 01/21              -antiplatelet therapy: N/A 3. Pain Management: Tylenol prn.  4. Mood/Behavior/Sleep/delirium: LCSW to follow for evaluation and support.              -antipsychotic agents: N/A--did not tolerate Seroquel    --Monitor sleep wake cycle. -Reminded nursing to add sleep log 1-28:  Did poorly with scheduled nightly Seroquel on inpatient due to daytime somnolence.  Add mirtazapine 15 mg nightly, keep melatonin 5 mg as needed. 1-29: Reminded nursing to apply sleep log.  Added as needed trazodone 50 mg. 1-30: Slept intermittently 1 to 2 hours for a total of 4 hours overnight. start scheduled trazodone 50 mg nightly.  Encourage sleep, wake cycle. Encouraged family to promote compliance with medications and sleep/wake cycle. Did discontinue Protonix per family request, and Atarax as needed due  to sedating effects. -Family notes that at baseline, patient does not sleep well at night, naps often during the day 1-31: Sleep log not updated, patient endorsing poor sleep.  Also endorsing significant symptoms of depression with therapies.  increase trazodone to 100 mg--No SI/HI, so will not involve psychiatry at this time. 2/2 sleep disturbed by frequent urination but otherwise no complaints 2-3: Sleep improved per log.  Patient agreeable to starting SSRI, fluoxetine 20 mg daily ordered for activating effects. 2/4: Tearful with PT yesterday; would benefit from neuropsych consult.  improved sleep with trazodone 100 mg..  Unfortunately, we will need to DC Prozac due to interaction with Pradaxa and hemoglobinuria. 2/5: family updated; agree best thing for mood moving forward is engagement with therapies and discharge home as soon as safe 2-7: Due to complaints of daytime lethargy, reduce trazodone to 50 mg.  Is sleeping well overnight per sleep log. 2/9 appears more alert this weekend but now focused on pain 2-10: Much more awake, alert, and pleasant today.  Excited regarding discharge tomorrow. 2-11: Depressed regarding delay of discharge.  Continue to be encouraging.  5. Neuropsych/cognition: This patient is not capable of making decisions on his own behalf.   -1-28: Adding telemonitoring, 4 side rails for safety.  Add Zyprexa 5 mg sublingual versus IM as needed for agitation, given patient is spitting most medication.  Restraints as above. --Have not required as needed Zyprexa  1/31: Overall, cognition seems to be improving a little today, with less preoccupation/distraction--continue to improve over the weekend 2-3  2/6: Neuropsych evaluation completed, patient was withdrawn/minimally interactive throughout--discussed with Dr. Kieth Brightly 2-7, feel making gains in mood and engagement will be challenging moving forward given patient's goals/personality.    6. Skin/Wound Care: Routine pressure  relief measures.    -Monitor of skin tears, bruises.   - Cover areas of skin tears with Mepilex dressings to avoid compulsive picking  2-4: Asked nursing to change left lower extremity bandage and provide new images for chart due to pulling away - looks changed, clean 2/5  7. Fluids/Electrolytes/Nutrition: Monitor I/O check CMET in am   - 1/27: See below; 500 cc IVF today, encourage PO, recheck BMP in AM. Add calcium supplement  -1-28: Labs improved, encourage p.o.'s.  1-30: AKI again today, with poor p.o. intakes.  Initiate IV fluids, repeat BMP in AM.  Mag low, give 4 g IV repletion, repeat in AM.  1/31: Magnesium repleted.  Management of AKI as below.  P.o. intakes adequate  2/3: Magnesium 1.7, within normal limits.  Potassium 4.6.  2/8 follow up labs Monday   2-10: Minimal p.o. fluid intakes, had IV fluid yesterday but only got about 150 to 200 cc.  See below, will give 1 L IV fluid today and repeat labs this p.m.  2-11: 1 L IV fluid yesterday, per nephrology switch to LR with 100 cc/h today.  Other lab parameters appear appropriate.  Patient is eating   8. GIB/Duodenal AVM s/p APC:  To continue PPI BID X 4 weeks-->10/26/23   - HgB stable; see #18  -1-30: Family insistent on discontinuation of Protonix due to possible side effects of cognitive deficits/ parkinsonism; understand this is a very unlikely contributor, discussed risks versus benefits with recent GI bleed, they continue to request removal of medication.  -1/31:  Protonix Dced; will start famotidine 20 mg twice daily as alternative. Add CBC in AM, FOBT.  -2-3: Hemoglobin stable.  Large bowel movement this a.m. will DC FOBT.    - Stable on current medications  2-10: DC Pepcid due to AKI as above.  Hemoglobin remained stable.  2-11: Repeat CBC in a.m.   9. Right hemothorax s/p CT/fibrinolysis: Has been weaned off oxygen             --follow up with Dr. Irena Cords in 2 months w/repeat CT chest   - Follow-up chest x-ray without  ongoing findings.  10. Delirium/intermittent movement d/o: Improved/resolved  11. PAF/SVT/HTN: HR improved with titrated of coreg to 12.5 mg bid.  -1/29: Continues intermittently refusing medications, heart rate well-controlled, blood pressure rising.  Resume home olmesartan at low-dose 5 mg; substituted for formulary Avapro 37.5 mg daily.  Add hydralazine 10 mg every 8 hours as needed for systolic blood pressure greater than 180, diastolic greater than 110  1-30: Blood pressure remains elevated, but he only just stated taking his medications today.  Family to encourage compliance, BNP today downtrending from prior so unlikely heart failure exacerbation.  2/3 BUN/creatinine improving, is now taking medications consistently, increase Avapro to 75 mg daily to approximate olmesartan 10 mg - start 2/4  2/5: Some mild hypotension yesterday, hypertension overnight, will monitor today for any symptomatic orthostasis.  2-6: Some increasing peripheral edema, blood pressures are stable.  Will recommend Ace wrapping right lower extremity, due to limitations of left lower extremity due to skin tear.  2/7: Reduce metoprolol to 6.25 BID due to HR 40s overnight. DC flomax as below.   2/9 HR only dropping into 50's--continue current dosing  2/10: Hypertensive overnight, with worsening AKI; likely contractive, will give gentle IVF today 75 cc/hr for     2/10: Cr increased again, DC mybetriq and Pepcid.  BNP/troponins minimally elevated 2-9, will check BNP again today (approximately stable) last echo 12/28 with normal EF< mildly reduced R systolic function.   2-11: See management of AKI below.  DC irbesartan per nephrology recommendations; has been a little bit high, but has as needed hydralazine if needed     11/09/2023    5:13 AM 11/08/2023    8:27 PM 11/08/2023   12:57 PM  Vitals with BMI  Systolic 166 148 811  Diastolic 75 70 66  Pulse 62 68 70     12.  Hypokalemia/Hypomagnesemia/hypocalcemia: K+ 3.1 this  AM- was given KCL- will recheck in AM   - 1/27: K stable/improved; will start Oscal 500 mg BID for Ca 7.8  1-28: K3.3,  start 20 mill equivalents potassium twice daily.  Retest later this week.  1-30: Potassium unchanged, resume twice daily repletion.  4 g IV mag as above Calcium repleted  2-3: Potassium repleted.  Continue current regimen.  Magnesium added on to a.m. labs late--resolved  13. HCAP: Treated with 7 day course of Zosyn   - No respiratory distress  -1-30: Chest x-ray today--no acute intrapulmonary process.  Pleural effusions resolved.  14. Gross hematuria/dysuria/incontinence/hypogastric pain: Per patient report, likely due to traumatic cath as inpatient.  To follow up with Dr. Annabell Howells on outpatient basis. On flomax 0.4 mg daily.   -1-29: High proteinuria and some blood in urinalysis; negative for infection.  Consistent with prior urinalysis  2-4: Recurrent.  Was micro hemoglobinuria on 2-1, CT abdomen and pelvis was canceled as below.  Will get new urinalysis/culture today, renal ultrasound to evaluate, DC Prozac as above due to potential bleed risk with Pradaxa.Patient reporting possible urethral injury while inpatient contributing.   2/5: Renal ultrasound yesterday showing solid region within the bladder 1.3 x 1.1 x 1.4 cm; blood clot versus mass without positive Doppler.  Also found nonobstructing calculus 1.5 cm in left lower renal pelvis, along with left renal cysts.  Hematuria improving with discontinuation of Prozac; can continue conservative management and follow-up outpatient with urology Dr. Broadus John as previously planned by internal medicine.   - Unfortunately, cannot initiate Pyridium due to CKD, will add Robaxin 500 mg every 8 hours as needed. 2/6: Low PVRs, improved appearance of urine.  Continue conservative management and outpatient follow-up with Dr. Annabell Howells as previously planned 2/7: DC flomax due to ongoing incontinence with low PVRs -monitor PVRs over this weekend to  ensure no retention  2/8 pt still with urinary frequency and a lot of discomfort.   -off flomax, voiding completely it appears, recent ucx neg  -will try myrbetriq and observe for response this weekend,  -oxycodone for pain   2/9 pt with ongoing dysuria, hypogastric discomfort   -increase myrbetriq to 50mg    -repeat ucx today, check kub   -may need urology consult tomorrow if no improvement    - 2-10: Had single ISC for retention greater than 500 yesterday afternoon; DC Myrbetriq.  Continue PVRs.  Urine culture with small bacteria, no leukocytes or nitrates, remains large hemoglobin (likely from clot and multiple traumatic caths ) and protein with casts (consistent with priors).  KUB unremarkable.   2-11: No further documented high PVRs/urinary retention.  Per documentation of I's and O's.  Getting abdominal ultrasound today for left lower quadrant fullness;?  Rectus sheath hematoma given seems to be more surface level and has had continued bleeding problems with Brilinta.  Results pending.  Repeating urinalysis today   - Reviewed prior ultrasounds, bladder mass not noted on [place] 05/2022 or 08/2023   15. T2DM: Hgb A1c- 5.5. Diet controlled at this time             --has been off actos.   16. Acute on chronic renal failure -CKD stage IIIb:   Baseline per OP records appear 1.2-1.5.  --Followed by Dr. Zetta Bills w/plan for renal bx prior to admission.              --encourage fluid intake- Cr 1.33 this AM up from 1.0- will recheck in Ama nd push fluids.   1/27: Continues uptrending to 1.5 today; Is only 240 yesterday; Push PO fluids and give 500 cc bolus today, check in AM  1-28: Creatinine improved to 1.3.  P.o. intakes remain poor, will continue daily monitoring and bolus as needed  1-30: Resume IV fluids today for AKI.  BMP in AM.  1/31: Received 500 cc overnight before pulling IV, creatinine uptrending.  Getting CT abdomen and pelvis today due to concern for renal stone as above.  2-3:  Creatinine improved back to 1.5; increasing ACE inhibitor as above.  2/7: BUN, cr mildly increased, 27/1.69 - encourage PO fluids, recheck Monday  2/9 BUN increased to 32-   -will give 500 cc iv 1/2ns today   -recheck in AM  2/10: Cr increased again, DC mybetriq and Pepcid due to worsening AKI; will give 1 L IV fluid today normal saline at 75 cc/h given stable BNP.  Repeat labs this p.m. with increasing creatinine, however had not gotten much fluid at that time.  2-11: Creatinine uptrending to 3.15 today, nephrology consulted, appreciate their insight and recommendations.  Per their assessment, still likely prerenal, will get 100 cc LR per hour today, DC irbesartan, adjust Brilinta for renal dosing.  Abdominal ultrasound and repeat urinalysis pending.  17. Hx Pancytopenia:  Bone bx w/ ICUS (brewing myelodysplasia) per Dr. Leonides Schanz.    - 1/27: WBC down slightly; monitor--stable 2-3  Stable 2/10   LOS: 16 days A FACE TO FACE EVALUATION WAS PERFORMED  Angelina Sheriff 11/09/2023, 8:50 AM

## 2023-11-09 NOTE — Progress Notes (Signed)
Patient ID: Joshua [name], male   DOB: [birth year], 73 y.o.   MRN: [id number]  Per attending, pt will not discharge today due to worsening AKI. Will determine when pt is ready for discharge.   Cecile Sheerer, MSW, LCSW Office: [phone] Cell: [phone] Fax: [phone]

## 2023-11-10 ENCOUNTER — Inpatient Hospital Stay (HOSPITAL_COMMUNITY): Payer: Medicare HMO

## 2023-11-10 ENCOUNTER — Inpatient Hospital Stay (HOSPITAL_COMMUNITY)
Admission: AD | Admit: 2023-11-10 | Discharge: 2023-11-14 | DRG: 683 | Disposition: A | Discharge status: Home Health | Payer: Medicare HMO | Source: Ambulatory Visit | Attending: Internal Medicine | Admitting: Internal Medicine

## 2023-11-10 ENCOUNTER — Encounter (HOSPITAL_COMMUNITY): Payer: Self-pay

## 2023-11-10 DIAGNOSIS — N400 Enlarged prostate without lower urinary tract symptoms: Secondary | ICD-10-CM | POA: Diagnosis present

## 2023-11-10 DIAGNOSIS — D638 Anemia in other chronic diseases classified elsewhere: Secondary | ICD-10-CM | POA: Diagnosis not present

## 2023-11-10 DIAGNOSIS — R7989 Other specified abnormal findings of blood chemistry: Secondary | ICD-10-CM | POA: Insufficient documentation

## 2023-11-10 DIAGNOSIS — R319 Hematuria, unspecified: Secondary | ICD-10-CM | POA: Diagnosis not present

## 2023-11-10 DIAGNOSIS — I480 Paroxysmal atrial fibrillation: Secondary | ICD-10-CM | POA: Diagnosis not present

## 2023-11-10 DIAGNOSIS — E1122 Type 2 diabetes mellitus with diabetic chronic kidney disease: Secondary | ICD-10-CM | POA: Diagnosis not present

## 2023-11-10 DIAGNOSIS — N170 Acute kidney failure with tubular necrosis: Secondary | ICD-10-CM | POA: Diagnosis not present

## 2023-11-10 DIAGNOSIS — N1831 Chronic kidney disease, stage 3a: Secondary | ICD-10-CM

## 2023-11-10 DIAGNOSIS — I129 Hypertensive chronic kidney disease with stage 1 through stage 4 chronic kidney disease, or unspecified chronic kidney disease: Secondary | ICD-10-CM | POA: Diagnosis not present

## 2023-11-10 DIAGNOSIS — N3289 Other specified disorders of bladder: Secondary | ICD-10-CM | POA: Diagnosis present

## 2023-11-10 DIAGNOSIS — D631 Anemia in chronic kidney disease: Secondary | ICD-10-CM | POA: Diagnosis present

## 2023-11-10 DIAGNOSIS — E785 Hyperlipidemia, unspecified: Secondary | ICD-10-CM | POA: Diagnosis not present

## 2023-11-10 DIAGNOSIS — D61818 Other pancytopenia: Secondary | ICD-10-CM | POA: Diagnosis present

## 2023-11-10 DIAGNOSIS — E8721 Acute metabolic acidosis: Secondary | ICD-10-CM | POA: Diagnosis not present

## 2023-11-10 DIAGNOSIS — N136 Pyonephrosis: Secondary | ICD-10-CM | POA: Diagnosis not present

## 2023-11-10 DIAGNOSIS — I252 Old myocardial infarction: Secondary | ICD-10-CM

## 2023-11-10 DIAGNOSIS — Z7902 Long term (current) use of antithrombotics/antiplatelets: Secondary | ICD-10-CM

## 2023-11-10 DIAGNOSIS — I2510 Atherosclerotic heart disease of native coronary artery without angina pectoris: Secondary | ICD-10-CM | POA: Diagnosis not present

## 2023-11-10 DIAGNOSIS — I10 Essential (primary) hypertension: Secondary | ICD-10-CM | POA: Diagnosis not present

## 2023-11-10 DIAGNOSIS — K8020 Calculus of gallbladder without cholecystitis without obstruction: Secondary | ICD-10-CM | POA: Diagnosis not present

## 2023-11-10 DIAGNOSIS — Z66 Do not resuscitate: Secondary | ICD-10-CM | POA: Diagnosis not present

## 2023-11-10 DIAGNOSIS — N329 Bladder disorder, unspecified: Secondary | ICD-10-CM | POA: Diagnosis not present

## 2023-11-10 DIAGNOSIS — E119 Type 2 diabetes mellitus without complications: Secondary | ICD-10-CM

## 2023-11-10 DIAGNOSIS — D649 Anemia, unspecified: Secondary | ICD-10-CM | POA: Diagnosis not present

## 2023-11-10 DIAGNOSIS — F17220 Nicotine dependence, chewing tobacco, uncomplicated: Secondary | ICD-10-CM | POA: Diagnosis not present

## 2023-11-10 DIAGNOSIS — E860 Dehydration: Secondary | ICD-10-CM | POA: Diagnosis not present

## 2023-11-10 DIAGNOSIS — E669 Obesity, unspecified: Secondary | ICD-10-CM | POA: Diagnosis not present

## 2023-11-10 DIAGNOSIS — R5381 Other malaise: Secondary | ICD-10-CM | POA: Diagnosis not present

## 2023-11-10 DIAGNOSIS — Z888 Allergy status to other drugs, medicaments and biological substances status: Secondary | ICD-10-CM

## 2023-11-10 DIAGNOSIS — E876 Hypokalemia: Secondary | ICD-10-CM | POA: Diagnosis not present

## 2023-11-10 DIAGNOSIS — R109 Unspecified abdominal pain: Secondary | ICD-10-CM | POA: Diagnosis not present

## 2023-11-10 DIAGNOSIS — N179 Acute kidney failure, unspecified: Principal | ICD-10-CM | POA: Diagnosis present

## 2023-11-10 DIAGNOSIS — Z79899 Other long term (current) drug therapy: Secondary | ICD-10-CM

## 2023-11-10 DIAGNOSIS — N1330 Unspecified hydronephrosis: Secondary | ICD-10-CM | POA: Diagnosis not present

## 2023-11-10 DIAGNOSIS — I471 Supraventricular tachycardia, unspecified: Secondary | ICD-10-CM | POA: Diagnosis present

## 2023-11-10 DIAGNOSIS — E1169 Type 2 diabetes mellitus with other specified complication: Secondary | ICD-10-CM

## 2023-11-10 DIAGNOSIS — Z820 Family history of epilepsy and other diseases of the nervous system: Secondary | ICD-10-CM

## 2023-11-10 DIAGNOSIS — Z7984 Long term (current) use of oral hypoglycemic drugs: Secondary | ICD-10-CM

## 2023-11-10 DIAGNOSIS — G479 Sleep disorder, unspecified: Secondary | ICD-10-CM | POA: Diagnosis not present

## 2023-11-10 DIAGNOSIS — K31819 Angiodysplasia of stomach and duodenum without bleeding: Secondary | ICD-10-CM

## 2023-11-10 DIAGNOSIS — N1832 Chronic kidney disease, stage 3b: Secondary | ICD-10-CM | POA: Diagnosis present

## 2023-11-10 DIAGNOSIS — D469 Myelodysplastic syndrome, unspecified: Secondary | ICD-10-CM | POA: Diagnosis not present

## 2023-11-10 DIAGNOSIS — N320 Bladder-neck obstruction: Secondary | ICD-10-CM | POA: Diagnosis present

## 2023-11-10 DIAGNOSIS — D709 Neutropenia, unspecified: Secondary | ICD-10-CM | POA: Insufficient documentation

## 2023-11-10 DIAGNOSIS — K7689 Other specified diseases of liver: Secondary | ICD-10-CM | POA: Diagnosis not present

## 2023-11-10 DIAGNOSIS — N281 Cyst of kidney, acquired: Secondary | ICD-10-CM | POA: Diagnosis not present

## 2023-11-10 DIAGNOSIS — Z8249 Family history of ischemic heart disease and other diseases of the circulatory system: Secondary | ICD-10-CM

## 2023-11-10 LAB — CBC
HCT: 29.6 % — ABNORMAL LOW (ref 39.0–52.0)
Hemoglobin: 9.2 g/dL — ABNORMAL LOW (ref 13.0–17.0)
MCH: 31 pg (ref 26.0–34.0)
MCHC: 31.1 g/dL (ref 30.0–36.0)
MCV: 99.7 fL (ref 80.0–100.0)
Platelets: 134 10*3/uL — ABNORMAL LOW (ref 150–400)
RBC: 2.97 MIL/uL — ABNORMAL LOW (ref 4.22–5.81)
RDW: 19.6 % — ABNORMAL HIGH (ref 11.5–15.5)
WBC: 2.8 10*3/uL — ABNORMAL LOW (ref 4.0–10.5)
nRBC: 0 % (ref 0.0–0.2)

## 2023-11-10 LAB — BASIC METABOLIC PANEL
Anion gap: 17 — ABNORMAL HIGH (ref 5–15)
BUN: 38 mg/dL — ABNORMAL HIGH (ref 8–23)
CO2: 17 mmol/L — ABNORMAL LOW (ref 22–32)
Calcium: 8.9 mg/dL (ref 8.9–10.3)
Chloride: 105 mmol/L (ref 98–111)
Creatinine, Ser: 3.51 mg/dL — ABNORMAL HIGH (ref 0.61–1.24)
GFR, Estimated: 18 mL/min — ABNORMAL LOW (ref >60)
Glucose, Bld: 81 mg/dL (ref 70–99)
Potassium: 4.3 mmol/L (ref 3.5–5.1)
Sodium: 139 mmol/L (ref 135–145)

## 2023-11-10 LAB — GLUCOSE, CAPILLARY: Glucose-Capillary: 71 mg/dL (ref 70–99)

## 2023-11-10 MED ORDER — CALCIUM CARBONATE 1250 (500 CA) MG PO TABS
500.0000 mg | ORAL_TABLET | Freq: Two times a day (BID) | ORAL | Status: DC
  Administered 2023-11-10 – 2023-11-14 (×8): 1250 mg via ORAL
  Filled 2023-11-10 (×9): qty 1

## 2023-11-10 MED ORDER — HYDRALAZINE HCL 10 MG PO TABS: 10.0000 mg | ORAL_TABLET | Freq: Three times a day (TID) | ORAL | Status: DC | PRN

## 2023-11-10 MED ORDER — HYDROCORTISONE 1 % EX CREA: TOPICAL_CREAM | CUTANEOUS | Status: DC | PRN

## 2023-11-10 MED ORDER — MELATONIN 5 MG PO TABS
5.0000 mg | ORAL_TABLET | Freq: Every day | ORAL | Status: DC
  Administered 2023-11-10 – 2023-11-12 (×3): 5 mg via ORAL
  Filled 2023-11-10 (×4): qty 1

## 2023-11-10 MED ORDER — POLYETHYLENE GLYCOL 3350 17 G PO PACK: 17.0000 g | PACK | Freq: Every day | ORAL | Status: DC | PRN

## 2023-11-10 MED ORDER — GUAIFENESIN-DM 100-10 MG/5ML PO SYRP: 5.0000 mL | ORAL_SOLUTION | Freq: Four times a day (QID) | ORAL | Status: DC | PRN

## 2023-11-10 MED ORDER — CARVEDILOL 6.25 MG PO TABS
6.2500 mg | ORAL_TABLET | Freq: Two times a day (BID) | ORAL | Status: DC
  Administered 2023-11-10 – 2023-11-14 (×8): 6.25 mg via ORAL
  Filled 2023-11-10 (×8): qty 1

## 2023-11-10 MED ORDER — BOOST / RESOURCE BREEZE PO LIQD CUSTOM
1.0000 | Freq: Two times a day (BID) | ORAL | Status: DC
  Administered 2023-11-11 – 2023-11-14 (×4): 1 via ORAL

## 2023-11-10 MED ORDER — ACETAMINOPHEN 325 MG PO TABS
650.0000 mg | ORAL_TABLET | Freq: Three times a day (TID) | ORAL | Status: DC
  Administered 2023-11-10 – 2023-11-14 (×15): 650 mg via ORAL
  Filled 2023-11-10 (×15): qty 2

## 2023-11-10 MED ORDER — ALUM & MAG HYDROXIDE-SIMETH 200-200-20 MG/5ML PO SUSP
30.0000 mL | ORAL | Status: DC | PRN
  Administered 2023-11-11: 30 mL via ORAL
  Filled 2023-11-10: qty 30

## 2023-11-10 MED ORDER — LIDOCAINE VISCOUS HCL 2 % MT SOLN: 15.0000 mL | OROMUCOSAL | Status: DC | PRN

## 2023-11-10 MED ORDER — BISACODYL 10 MG RE SUPP
10.0000 mg | Freq: Every day | RECTAL | Status: DC | PRN
Start: 2023-11-10 — End: 2023-11-14

## 2023-11-10 MED ORDER — DABIGATRAN ETEXILATE MESYLATE 75 MG PO CAPS: 75.0000 mg | ORAL_CAPSULE | Freq: Two times a day (BID) | ORAL | Status: DC

## 2023-11-10 MED ORDER — TAMSULOSIN HCL 0.4 MG PO CAPS
0.4000 mg | ORAL_CAPSULE | Freq: Every day | ORAL | Status: DC
  Administered 2023-11-10: 0.4 mg via ORAL
  Filled 2023-11-10: qty 1

## 2023-11-10 MED ORDER — SIMETHICONE 80 MG PO CHEW
80.0000 mg | CHEWABLE_TABLET | Freq: Four times a day (QID) | ORAL | Status: DC
  Administered 2023-11-10 – 2023-11-14 (×14): 80 mg via ORAL
  Filled 2023-11-10 (×14): qty 1

## 2023-11-10 MED ORDER — FOLIC ACID 1 MG PO TABS
1.0000 mg | ORAL_TABLET | Freq: Every day | ORAL | Status: DC
  Administered 2023-11-11 – 2023-11-14 (×4): 1 mg via ORAL
  Filled 2023-11-10 (×4): qty 1

## 2023-11-10 MED ORDER — INSULIN ASPART 100 UNIT/ML IJ SOLN
0.0000 [IU] | Freq: Three times a day (TID) | INTRAMUSCULAR | Status: DC
  Administered 2023-11-12 – 2023-11-13 (×3): 1 [IU] via SUBCUTANEOUS

## 2023-11-10 MED ORDER — ROSUVASTATIN CALCIUM 20 MG PO TABS
20.0000 mg | ORAL_TABLET | Freq: Every day | ORAL | Status: DC
  Administered 2023-11-11 – 2023-11-14 (×4): 20 mg via ORAL
  Filled 2023-11-10 (×4): qty 1

## 2023-11-10 MED ORDER — TRAMADOL HCL 50 MG PO TABS
50.0000 mg | ORAL_TABLET | Freq: Three times a day (TID) | ORAL | Status: DC | PRN
  Administered 2023-11-10: 50 mg via ORAL
  Filled 2023-11-10: qty 1

## 2023-11-10 MED ORDER — ONDANSETRON 4 MG PO TBDP: 4.0000 mg | ORAL_TABLET | Freq: Three times a day (TID) | ORAL | Status: DC | PRN

## 2023-11-10 MED ORDER — TRAZODONE HCL 50 MG PO TABS
50.0000 mg | ORAL_TABLET | Freq: Every day | ORAL | Status: DC
  Administered 2023-11-10 – 2023-11-12 (×3): 50 mg via ORAL
  Filled 2023-11-10 (×4): qty 1

## 2023-11-10 MED ORDER — SALINE SPRAY 0.65 % NA SOLN: 2.0000 | NASAL | Status: DC | PRN

## 2023-11-10 MED ORDER — ORAL CARE MOUTH RINSE: 15.0000 mL | OROMUCOSAL | Status: DC | PRN

## 2023-11-10 MED ORDER — TAMSULOSIN HCL 0.4 MG PO CAPS
0.4000 mg | ORAL_CAPSULE | Freq: Every day | ORAL | Status: DC
  Administered 2023-11-11 – 2023-11-14 (×4): 0.4 mg via ORAL
  Filled 2023-11-10 (×4): qty 1

## 2023-11-10 MED ORDER — ISOSORBIDE MONONITRATE ER 30 MG PO TB24
30.0000 mg | ORAL_TABLET | Freq: Every day | ORAL | Status: DC
  Administered 2023-11-11 – 2023-11-14 (×4): 30 mg via ORAL
  Filled 2023-11-10 (×4): qty 1

## 2023-11-10 MED ORDER — HEPARIN SODIUM (PORCINE) 5000 UNIT/ML IJ SOLN
5000.0000 [IU] | Freq: Three times a day (TID) | INTRAMUSCULAR | Status: DC
  Administered 2023-11-10 – 2023-11-11 (×2): 5000 [IU] via SUBCUTANEOUS
  Filled 2023-11-10 (×2): qty 1

## 2023-11-10 MED ORDER — SODIUM CHLORIDE 0.9% FLUSH
3.0000 mL | Freq: Two times a day (BID) | INTRAVENOUS | Status: DC
  Administered 2023-11-10 – 2023-11-14 (×8): 3 mL via INTRAVENOUS

## 2023-11-10 NOTE — Progress Notes (Signed)
Patient ID: Joshua [name], male   DOB: 1951-08-16, 73 y.o.   MRN: [id number]  Per medical team, pt will now transfer to acute.   SW updated [facility] on above.  Cecile Sheerer, MSW, LCSW Office: [phone] Cell: [phone] Fax: [phone]

## 2023-11-10 NOTE — Progress Notes (Signed)
Patient received in room. Settled in bed, complain of mild pain to abdomen. VS done.

## 2023-11-10 NOTE — H&P (Addendum)
History and Physical   Joshua [name]:[id number] DOB: Feb 11, 1951 DOA: 11/10/2023  PCP: [name], MD   Patient coming from: CIR  Chief Complaint: AKI  HPI: Joshua [name] is a 73 y.o. male with medical history significant of hypertension, hyperlipidemia, CAD, diabetes, anemia, CKD 3, atrial fibrillation, pancytopenia, sleep disturbance, delirium, obesity, status post laminectomy, GI bleed presenting with AKI from inpatient rehab.  Patient was admitted 12/27-1/26 with encephalopathy and GI bleed in the setting of increased INR.  Found to have AVMs treated with APC.  GI bleeding resolved, hemoglobin stabilized, patient was resumed on Pradaxa.  Also noted to have loculated effusion and had chest tube placed x 2 in consultation with pulmonology and CT surgery.  Received lytics x 2 and chest tube was taken out on 1/17.  Also treated for AKI that admission.  Ultimately discharged to inpatient rehab due to decline from prolonged admission and ongoing issues with delirium.  Regimen there was eventually found that helped improve patient's delirium/cognition and sleep and he remains on this.  Patient was achieving his rehab goals and was approaching discharge in the last few days but has subsequently developed AKI.  Creatinine trend in the last 4 days has been 1.5, 2.0, 3.1, 3.5.  Patient has also reported some right lower quadrant abdominal pain.  Denies fevers, chills, chest pain, shortness breath, constipation, diarrhea, nausea, vomiting.  ED Course: Vital signs notable for blood pressure in the 140s to 160s systolic, heart rate in the 50s to 80s.  Lab workup includes BMP with creatinine trend as above most recently 3.51, bicarb 17, gap 17, BUN 38.  Wrist recent CBC was 2 days ago with hemoglobin stable at 10, white count stable at 3.4, platelets stable at 126.  Ammonia yesterday 38.  Urine sodium, urine creatinine, urinalysis pending.  CT abdomen pelvis has been performed showing mild  bilateral hydronephrosis without obstructing stones, bilateral oral stranding of the kidneys which is nonspecific, enlarged prostate, unremarkable but distended bladder.  Patient has received IV fluids over the last couple days and consultation with nephrology.  His ARB has also been held.  Has continued to have worsening creatinine but the rate of rise has slowed.  Transfer to acute hospital requested as patient has met all rehab goals but has developed medical issue not resolving and unsafe for discharge home, no longer meets criteria for inpatient rehab stay.  Patient accepted for transfer to acute hospital.  Review of Systems: As per HPI otherwise all other systems reviewed and are negative.  Past Medical History:  Diagnosis Date   Arthritis    Atrial fibrillation (HCC)    Atrial flutter (HCC)    Back pain    CAD (coronary artery disease)    stents placed 2009   Cholelithiasis    Complication of anesthesia    woke up during surgery and ablation   DM (diabetes mellitus) (HCC)    type 2   Elevated PSA    Gout    Hearing problem    HTN (hypertension)    Hyperlipidemia    Myocardial infarct, old 2010   Prostatitis    Renal insufficiency     Past Surgical History:  Procedure Laterality Date   CARDIAC CATHETERIZATION  11/07/07   with 2 stents   COLONOSCOPY     Electrophysiologic study and RF catheter ablation   of AV node reentrant tachycardia.     ESOPHAGOGASTRODUODENOSCOPY (EGD) WITH PROPOFOL N/A 09/30/2023   Procedure: ESOPHAGOGASTRODUODENOSCOPY (EGD) WITH PROPOFOL;  Surgeon: Lynann Bologna, MD;  Location: [facility];  Service: Gastroenterology;  Laterality: N/A;   EXTERNAL EAR SURGERY Right    cartilage   HOT HEMOSTASIS N/A 09/30/2023   Procedure: HOT HEMOSTASIS (ARGON PLASMA COAGULATION/BICAP);  Surgeon: Lynann Bologna, MD;  Location: [facility];  Service: Gastroenterology;  Laterality: N/A;   LEFT AND RIGHT HEART CATHETERIZATION WITH CORONARY/GRAFT ANGIOGRAM N/A 09/27/2014    Procedure: LEFT AND RIGHT HEART CATHETERIZATION WITH Isabel Caprice;  Surgeon: Lesleigh Noe, MD;  Location: [facility] CATH LAB;  Service: Cardiovascular;  Laterality: N/A;   LUMBAR LAMINECTOMY/DECOMPRESSION MICRODISCECTOMY Left 04/29/2018   Procedure: Left Lumbar Four-Five Laminectomy/Foraminotomy;  Surgeon: Tia Alert, MD;  Location[facility];  Service: Neurosurgery;  Laterality: Left;  Left Lumbar Four-Five Laminectomy/Foraminotomy   TOOTH EXTRACTION     VASECTOMY      Social History  reports that he has never smoked. He has quit using smokeless tobacco.  His smokeless tobacco use included chew. He reports that he does not drink alcohol and does not use drugs.  Allergies  Allergen Reactions   Desipramine Hives and Itching   Niacin Itching    Family History  Problem Relation Age of Onset   Hypertension Mother    Gout Mother    Alzheimer's disease Mother    Heart failure Father    Heart disease Brother    Cancer Brother        type unknown  Reviewed on admission  Prior to Admission medications   Medication Sig Start Date End Date Taking? Authorizing Provider  carvedilol (COREG) 12.5 MG tablet TAKE 1 TABLET BY MOUTH TWICE A DAY 06/14/23   Marinus Maw, MD  fenofibrate 160 MG tablet Take 160 mg by mouth daily. 06/16/20   [provider]  folic acid (FOLVITE) 1 MG tablet Take 1 tablet (1 mg total) by mouth daily. 10/25/23   Danford, Earl Lites, MD  ipratropium (ATROVENT) 0.06 % nasal spray Place 1 spray into both nostrils 3 (three) times daily as needed for rhinitis. 05/29/19   [provider]  isosorbide mononitrate (IMDUR) 30 MG 24 hr tablet Take 30 mg by mouth daily. 03/11/23   [provider]  nitroGLYCERIN (NITROSTAT) 0.4 MG SL tablet Place 0.4 mg under the tongue every 5 (five) minutes x 3 doses as needed for chest pain.    [provider]  olmesartan (BENICAR) 40 MG tablet Take 1 tablet (40 mg total) by mouth daily. 04/27/23   Marinus Maw, MD  pantoprazole (PROTONIX) 40 MG tablet Take 1 tablet (40 mg total) by mouth 2 (two) times daily before a meal. 10/24/23 10/23/24  Danford, Earl Lites, MD  pioglitazone (ACTOS) 15 MG tablet Take 15 mg by mouth daily.    [provider]  PRADAXA 150 MG CAPS capsule TAKE 1 CAPSULE BY MOUTH TWICE A DAY 10/04/23   Marinus Maw, MD  rosuvastatin (CRESTOR) 20 MG tablet Take 20 mg by mouth daily. 03/30/20   [provider]  tamsulosin (FLOMAX) 0.4 MG CAPS capsule Take 1 capsule (0.4 mg total) by mouth daily. 10/25/23   Danford, Earl Lites, MD  TYLENOL 8 HOUR ARTHRITIS PAIN 650 MG CR tablet Take 650-1,300 mg by mouth every 8 (eight) hours as needed for pain.    [provider]  vitamin B-12 (CYANOCOBALAMIN) 500 MCG tablet Take 500 mcg by mouth daily.    [provider]    Physical Exam: Vitals:   11/10/23 1514 11/10/23 1523 11/10/23 1541 11/10/23  1600  BP: (!) 143/64  (!) 156/71 (!) 142/71  Pulse: (!) 51 (!) 52 (!) 56 (!) 50  Resp: 20 19 (!) 22 19  Temp:   (!) 97.5 F (36.4 C)   TempSrc:   Oral   SpO2: 93% 92% 94% 92%    Physical Exam Constitutional:      General: He is not in acute distress.    Appearance: Normal appearance.  HENT:     Head: Normocephalic and atraumatic.     Mouth/Throat:     Mouth: Mucous membranes are moist.     Pharynx: Oropharynx is clear.  Eyes:     Extraocular Movements: Extraocular movements intact.     Pupils: Pupils are equal, round, and reactive to light.  Cardiovascular:     Rate and Rhythm: Regular rhythm. Bradycardia present.     Pulses: Normal pulses.     Heart sounds: Normal heart sounds.  Pulmonary:     Effort: Pulmonary effort is normal. No respiratory distress.     Breath sounds: Normal breath sounds.  Abdominal:     General: Bowel sounds are normal. There is no distension.     Palpations: Abdomen is soft.     Tenderness: There is no abdominal tenderness.     Comments: Tender at lower abdomen,  suspect bladder distention  Musculoskeletal:        General: No swelling or deformity.  Skin:    General: Skin is warm and dry.  Neurological:     General: No focal deficit present.     Mental Status: Mental status is at baseline.    Labs on Admission: I have personally reviewed following labs and imaging studies  CBC: Recent Labs  Lab 11/04/23 0537 11/07/23 0040 11/08/23 0535  WBC 3.3* 3.4* 3.4*  HGB 8.5* 8.2* 10.0*  HCT 27.6* 26.4* 31.8*  MCV 99.3 98.5 98.5  PLT 115* 115* 126*    Basic Metabolic Panel: Recent Labs  Lab 11/07/23 0040 11/08/23 0535 11/08/23 1931 11/09/23 0507 11/10/23 0523  NA 136 139 136 138 139  K 4.3 4.5 4.7 3.9 4.3  CL 105 102 103 102 105  CO2 22 17* 20* 19* 17*  GLUCOSE 104* 108* 96 92 81  BUN 32* 34* 35* 35* 38*  CREATININE 1.51* 2.01* 2.81* 3.15* 3.51*  CALCIUM 8.6* 9.8 9.0 9.2 8.9    GFR: Estimated Creatinine Clearance: 21.4 mL/min (A) (by C-G formula based on SCr of 3.51 mg/dL (H)).  Liver Function Tests: No results for input(s): "AST", "ALT", "ALKPHOS", "BILITOT", "PROT", "ALBUMIN" in the last 168 hours.  Urine analysis:    Component Value Date/Time   COLORURINE YELLOW 11/07/2023 1149   APPEARANCEUR CLEAR 11/07/2023 1149   LABSPEC 1.012 11/07/2023 1149   PHURINE 5.0 11/07/2023 1149   GLUCOSEU NEGATIVE 11/07/2023 1149   GLUCOSEU NEGATIVE 09/09/2009 1651   HGBUR LARGE (A) 11/07/2023 1149   BILIRUBINUR NEGATIVE 11/07/2023 1149   KETONESUR NEGATIVE 11/07/2023 1149   PROTEINUR >=300 (A) 11/07/2023 1149   UROBILINOGEN 0.2 09/09/2009 1651   NITRITE NEGATIVE 11/07/2023 1149   LEUKOCYTESUR NEGATIVE 11/07/2023 1149    Radiological Exams on Admission: CT ABDOMEN PELVIS WO CONTRAST Result Date: 11/10/2023 CLINICAL DATA:  Right lower quadrant abdominal pain. EXAM: CT ABDOMEN AND PELVIS WITHOUT CONTRAST TECHNIQUE: Multidetector CT imaging of the abdomen and pelvis was performed following the standard protocol without IV contrast.  RADIATION DOSE REDUCTION: This exam was performed according to the departmental dose-optimization program which includes automated exposure control, adjustment of the  mA and/or kV according to patient size and/or use of iterative reconstruction technique. COMPARISON:  CT abdomen pelvis dated 04/27/2014. FINDINGS: Evaluation of this exam is limited in the absence of intravenous contrast. Lower chest: Trace right pleural effusion. There is associated minimal compressive atelectasis of the right lung base. Three vessel coronary vascular calcification. No intra-abdominal free air.  Trace free fluid in the lower abdomen. Hepatobiliary: Small cyst in the anterior liver. No biliary dilatation. No calcified gallstone. Probable sludge or noncalcified stone in the gallbladder. No pericholecystic fluid or evidence of acute cholecystitis by CT. Pancreas: Unremarkable. No pancreatic ductal dilatation or surrounding inflammatory changes. Spleen: Normal in size without focal abnormality. Adrenals/Urinary Tract: The adrenal glands are unremarkable. Mild bilateral hydronephrosis. There is no stone on either side. Left renal inferior pole cyst measures 12 cm. Additional smaller cyst noted in the inferior pole of the left kidney. Indeterminate 9 mm higher attenuating lesion from the upper pole of the left kidney may represent a complex/proteinaceous cyst. The visualized ureters appear unremarkable. The urinary bladder is distended and grossly unremarkable. Stomach/Bowel: There is no bowel obstruction or active inflammation. The appendix is normal. Vascular/Lymphatic: Moderate aortoiliac atherosclerotic disease. The IVC is unremarkable. No portal venous gas. There is no adenopathy. Reproductive: Enlarged prostate gland measuring 6.5 cm in transverse axial diameter. The seminal vesicles are symmetric. Other: There is bilateral perinephric stranding extending down along the paracolic gutters and adjacent to the urinary bladder. This is  nonspecific and may be fluid related to renal cyst or represent underlying inflammatory/infectious process. Correlation with urinalysis recommended. Musculoskeletal: Osteopenia.  No acute osseous pathology. IMPRESSION: 1. Mild bilateral hydronephrosis. No stone on either side. 2. Bilateral perinephric stranding, nonspecific. Correlation with urinalysis recommended. 3. Enlarged prostate gland. 4. Trace right pleural effusion. Electronically Signed   By: Elgie Collard M.D.   On: 11/10/2023 12:05   US Abdomen Complete Result Date: 11/09/2023 CLINICAL DATA:  Abdominal pain EXAM: ABDOMEN ULTRASOUND COMPLETE COMPARISON:  Right upper quadrant ultrasound 10/06/2023 and older. Renal ultrasound 11/02/2023. FINDINGS: Gallbladder: Gallbladder is mildly distended but there is significant gallstones. No wall thickening or adjacent fluid. Common bile duct: Diameter: 5 mm Liver: No focal lesion identified. Within normal limits in parenchymal echogenicity. Portal vein is patent on color Doppler imaging with normal direction of blood flow towards the liver. IVC: No abnormality visualized. Pancreas: Poorly seen with overlapping bowel gas and soft tissue. Spleen: Enlarged at 13.6 cm Right Kidney: Length: 12.1 cm. No collecting system dilatation. There is a small anechoic structure in the right kidney measuring 13 mm consistent with a benign cyst. Left Kidney: Length: 12.1 cm in length. No collecting system dilatation with the left kidney is poorly seen with overlapping bowel gas and soft tissue. Dominant cyst identified once again measuring up to 11.4 cm. Abdominal aorta: Overall poorly seen. Other findings: Extremely limited examination for several structures with the overlapping bowel gas and soft tissue. If needed additional cross-sectional imaging study such as CT could be considered as clinically appropriate pleural effusion. IMPRESSION: Gallstones again seen.  No ductal dilatation. Splenomegaly. Renal cysts. Several  structures are poorly seen with overlapping bowel gas and soft tissue. If there is further concern additional cross-sectional imaging study such as CT may be useful in this situation for further delineation. Electronically Signed   By: Karen Kays M.D.   On: 11/09/2023 17:32   Assessment/Plan Principal Problem:   Acute renal failure superimposed on stage 3a chronic kidney disease, unspecified acute renal failure type (HCC)  AKI on CKD 3 > Creatinine trend 1.5, 2.0, 3.1, 3.5. > In setting of decreased p.o. intake and decreased urine output, suspected initially to be AKI secondary to this. > Despite IV fluids AKI has persisted the rate of creatinine rise has declined.  There is possibility of ATN, though CT is now showing hydronephrosis and bladder distention in the setting of enlarged prostate (though this was not previously noted on ultrasound done yesterday, indicating this hydronephrosis occurred after AKI). > Nephrology consulted and are following. Follows with nephro otpt as well appears a renal Bx had been planned. Will hold Pradaxa for now in case this needs to be done while admitted. - Monitor on MedSurg - Appreciate nephrology recommendations and assistance - Trend renal function and electrolytes - Follow-up urine sodium, urine creatinine, urinalysis - Continue IV fluids - As needed bladder scans and as needed INO cath  Hypertension - Continue home Coreg, hydralazine, Imdur - Holding ARB as above   Hyperlipidemia - Continue home rosuvastatin  CAD - Continue home Coreg, Imdur, rosuvastatin - Holding ARB  Diabetes - SSI  Anemia > Hemoglobin stable 10.0 - Trend CBC  Atrial fibrillation - Continue home Coreg - Hold Pradaxa   Pancytopenia > Follows outpatient.  Suspected myelodysplastic syndrome. > Hemoglobin stable at 10, WBC stable 3.4, platelets stable 1.6. - Trend CBC  Sleep disturbance Delirium - Continue melatonin, trazodone  Obesity - Noted  Recent  prolonged hospitalization > Status post CIR stay, has reached goals. - Continue with PT as needed.  DVT prophylaxis: Holding Pradaxa, Heparin for now Code Status:    DNR/DNI Family Communication:  None on admission  Disposition Plan:   Patient is from:  CIR  Anticipated DC to:  Home  Anticipated DC date:  2 to 4 days  Anticipated DC barriers: None  Consults called:  Nephrology Admission status:  Inpatient, MedSurg  Severity of Illness: The appropriate patient status for this patient is INPATIENT. Inpatient status is judged to be reasonable and necessary in order to provide the required intensity of service to ensure the patient's safety. The patient's presenting symptoms, physical exam findings, and initial radiographic and laboratory data in the context of their chronic comorbidities is felt to place them at high risk for further clinical deterioration. Furthermore, it is not anticipated that the patient will be medically stable for discharge from the hospital within 2 midnights of admission.   * I certify that at the point of admission it is my clinical judgment that the patient will require inpatient hospital care spanning beyond 2 midnights from the point of admission due to high intensity of service, high risk for further deterioration and high frequency of surveillance required.Synetta Fail MD [facility]  How to contact the [facility] Attending or Consulting provider 7A - 7P or covering provider during after hours 7P -7A, for this patient?   Check the care team in [facility] and look for a) attending/consulting [facility] provider listed and b) the [facility] team listed Log into [email or web address] and use [facility] universal password to access. If you do not have the password, please contact the hospital operator. Locate the [facility] provider you are looking for under [facility] and page to a number that you can be directly reached. If you still have difficulty reaching the provider,  please page the [facility] (Director on Call) for the Hospitalists listed on amion for assistance.  11/10/2023, 5:01 PM

## 2023-11-10 NOTE — Progress Notes (Signed)
PROGRESS NOTE   Subjective/Complaints: Patient with ongoing abdominal pain, fullness, especially in the left lower quadrant.  Sleeping poorly, having ongoing multiple bowel movements overnight.  States "you will keep me from going home", but agreeable to ongoing workup.  BUN/Cr uptrending despite IVF; CO2 down to 17. Other labs stable. Abdominal US yesterday obstructed by bowel gas, showed splenomegaly.   Vitals with elevated BP 164/97; otherwise stable  ISC again this AM for bladder scan >550   ROS: Patient denies fever, rash, sore throat, blurred vision, dizziness, nausea, vomiting, diarrhea, cough, shortness of breath or chest pain,  headache, or mood change.  + abdominal pain / dysuria--ongoing   Objective:   US Abdomen Complete Result Date: 11/09/2023 CLINICAL DATA:  Abdominal pain EXAM: ABDOMEN ULTRASOUND COMPLETE COMPARISON:  Right upper quadrant ultrasound 10/06/2023 and older. Renal ultrasound 11/02/2023. FINDINGS: Gallbladder: Gallbladder is mildly distended but there is significant gallstones. No wall thickening or adjacent fluid. Common bile duct: Diameter: 5 mm Liver: No focal lesion identified. Within normal limits in parenchymal echogenicity. Portal vein is patent on color Doppler imaging with normal direction of blood flow towards the liver. IVC: No abnormality visualized. Pancreas: Poorly seen with overlapping bowel gas and soft tissue. Spleen: Enlarged at 13.6 cm Right Kidney: Length: 12.1 cm. No collecting system dilatation. There is a small anechoic structure in the right kidney measuring 13 mm consistent with a benign cyst. Left Kidney: Length: 12.1 cm in length. No collecting system dilatation with the left kidney is poorly seen with overlapping bowel gas and soft tissue. Dominant cyst identified once again measuring up to 11.4 cm. Abdominal aorta: Overall poorly seen. Other findings: Extremely limited examination for  several structures with the overlapping bowel gas and soft tissue. If needed additional cross-sectional imaging study such as CT could be considered as clinically appropriate pleural effusion. IMPRESSION: Gallstones again seen.  No ductal dilatation. Splenomegaly. Renal cysts. Several structures are poorly seen with overlapping bowel gas and soft tissue. If there is further concern additional cross-sectional imaging study such as CT may be useful in this situation for further delineation. Electronically Signed   By: Karen Kays M.D.   On: 11/09/2023 17:32      Recent Labs    11/08/23 0535  WBC 3.4*  HGB 10.0*  HCT 31.8*  PLT 126*   Recent Labs    11/09/23 0507 11/10/23 0523  NA 138 139  K 3.9 4.3  CL 102 105  CO2 19* 17*  GLUCOSE 92 81  BUN 35* 38*  CREATININE 3.15* 3.51*  CALCIUM 9.2 8.9    Intake/Output Summary (Last 24 hours) at 11/10/2023 0848 Last data filed at 11/10/2023 0754 Gross per 24 hour  Intake 0 ml  Output 850 ml  Net -850 ml        Physical Exam: Vital Signs Blood pressure (!) 164/97, pulse 76, temperature 97.6 F (36.4 C), temperature source Oral, resp. rate 18, height 5' 7.99" (1.727 m), weight 102.7 kg, SpO2 96%.  Physical Exam   PE: Constitutional: No distress . Vital signs reviewed.  Laying in bed. HEENT: NCAT, EOMI, oral membranes moist Neck: supple Cardiovascular: RRR without murmur. No JVD   .  Peripheral edema trace bilaterally.  Respiratory/Chest: CTA Bilaterally without wheezes or rales. Normal effort    GI/Abdomen: BS +, normoactive.  Tenderness to palpation has changed from right lower quadrant to left lower quadrant, with palpable fullness over that area--unchanged 2-12  Ext: Bilateral lower extremity chronic skin changes, 1+ bilateral edema Psych: Flat, depressed.  Skin: A lot of skin tears on UE"s and bruising Hemosiderin staining b/l calfs Left skin tear base of wound appears red and beefy with mild serosanguineous drainage;  mild surrounding erythema without warmth. Covered in Mepilex dressing.--Unchanged 2-12  Neuro: AAOx 4, follows all simple commands.  No further cognitive delay.  No apparent neurologic deficits.  Sensory, motor, and cranial nerve exam intact  Assessment/Plan: 1. Functional deficits which require 3+ hours per day of interdisciplinary therapy in a comprehensive inpatient rehab setting. Physiatrist is providing close team supervision and 24 hour management of active medical problems listed below. Physiatrist and rehab team continue to assess barriers to discharge/monitor patient progress toward functional and medical goals  Care Tool:  Bathing    Body parts bathed by patient: Right arm, Left arm, Chest, Abdomen, Front perineal area, Buttocks, Right upper leg, Left upper leg, Right lower leg, Left lower leg, Face   Body parts bathed by helper: Front perineal area, Buttocks, Right upper leg, Left upper leg, Right lower leg, Left lower leg     Bathing assist Assist Level: Supervision/Verbal cueing     Upper Body Dressing/Undressing Upper body dressing   What is the patient wearing?: Pull over shirt    Upper body assist Assist Level: Supervision/Verbal cueing    Lower Body Dressing/Undressing Lower body dressing      What is the patient wearing?: Incontinence brief, Pants     Lower body assist Assist for lower body dressing: Minimal Assistance - Patient > 75%     Toileting Toileting    Toileting assist Assist for toileting: Supervision/Verbal cueing     Transfers Chair/bed transfer  Transfers assist  Chair/bed transfer activity did not occur: Safety/medical concerns (unsafe to get up)  Chair/bed transfer assist level: Supervision/Verbal cueing     Locomotion Ambulation   Ambulation assist   Ambulation activity did not occur: Safety/medical concerns  Assist level: Supervision/Verbal cueing Assistive device: Walker-rolling Max distance: 350'   Walk 10 feet  activity   Assist  Walk 10 feet activity did not occur: Safety/medical concerns  Assist level: Supervision/Verbal cueing Assistive device: Walker-rolling   Walk 50 feet activity   Assist Walk 50 feet with 2 turns activity did not occur: Safety/medical concerns  Assist level: Supervision/Verbal cueing Assistive device: Walker-rolling    Walk 150 feet activity   Assist Walk 150 feet activity did not occur: Safety/medical concerns  Assist level: Supervision/Verbal cueing Assistive device: Walker-rolling    Walk 10 feet on uneven surface  activity   Assist Walk 10 feet on uneven surfaces activity did not occur: Safety/medical concerns   Assist level: Supervision/Verbal cueing Assistive device: Walker-rolling   Wheelchair     Assist Is the patient using a wheelchair?: No Type of Wheelchair: Manual    Wheelchair assist level: Dependent - Patient 0%      Wheelchair 50 feet with 2 turns activity    Assist        Assist Level: Dependent - Patient 0%   Wheelchair 150 feet activity     Assist      Assist Level: Dependent - Patient 0%   Blood pressure (!) 164/97, pulse 76, temperature 97.6 F (  36.4 C), temperature source Oral, resp. rate 18, height 5' 7.99" (1.727 m), weight 102.7 kg, SpO2 96%. Medical Problem List and Plan: 1. Functional deficits secondary to debility from GI bleed/delirium             -patient may  shower- cover skin tears             -ELOS/Goals: 12-14 days min A to supervision- DC to acute           -Continue CIR therapies including PT, OT, and SLP   2-11: Medical hold for discharge due to worsening AKI as below.  Nephrology consulted, workup pending--is being admitted to acute for the same 2-12  2.  Antithrombotics: -DVT/anticoagulation:  Pharmaceutical: Other (comment)-_Pradaxa resumed on 01/21              -antiplatelet therapy: N/A 3. Pain Management: Tylenol prn.  4. Mood/Behavior/Sleep/delirium: LCSW to follow for  evaluation and support.             Did poorly with scheduled nightly Seroquel on inpatient due to daytime - somnolence.   - Encouraged family to promote compliance with medications and sleep/wake cycle. Did discontinue Protonix per family request, and Atarax as needed due to sedating effects. -Family notes that at baseline, patient does not sleep well at night, naps often during the day Trial of mirtazapine, switch to Prozac but had interaction with antiplatelet and had to DC.  Did well with nightly trazodone 50 mg. -2-12: Ongoing depression regarding delay of discharge, continue to be encouraging.  5. Neuropsych/cognition: This patient is capable of making decisions on his own behalf.  -Cognition much improved over duration of inpatient rehab  2/6: Neuropsych evaluation completed, patient was withdrawn/minimally interactive throughout--discussed with Dr. Kieth Brightly 2-7, feel making gains in mood and engagement will be challenging moving forward given patient's goals/personality.    6. Skin/Wound Care: Routine pressure relief measures.    -Monitor of skin tears, bruises.   - Cover areas of skin tears with Mepilex dressings to avoid compulsive picking  2-4: Asked nursing to change left lower extremity bandage and provide new images for chart due to pulling away - looks changed, clean 2/5  7. Fluids/Electrolytes/Nutrition: Monitor I/O check CMET in am   - 1/27: See below; 500 cc IVF today, encourage PO, recheck BMP in AM. Add calcium supplement  -1-28: Labs improved, encourage p.o.'s.  1-30: AKI again today, with poor p.o. intakes.  Initiate IV fluids, repeat BMP in AM.  Mag low, give 4 g IV repletion, repeat in AM.  1/31: Magnesium repleted.  Management of AKI as below.  P.o. intakes adequate  2/3: Magnesium 1.7, within normal limits.  Potassium 4.6.  2/8 follow up labs Monday   2-10: Minimal p.o. fluid intakes, had IV fluid yesterday but only got about 150 to 200 cc.  See below, will give 1  L IV fluid today and repeat labs this p.m.  2-11: 1 L IV fluid yesterday, per nephrology switch to LR with 100 cc/h today.  Other lab parameters appear appropriate.  Patient is eating   2/12: Poor POS, I/Os inaccurate--no IV fluids recorded--d/w nursing, got 1.2 L yesterday.  Continue IV fluids per nephrology.  8. GIB/Duodenal AVM s/p APC:  To continue PPI BID X 4 weeks-->10/26/23   - HgB stable; see #18  -1-30: Family insistent on discontinuation of Protonix due to possible side effects of cognitive deficits/ parkinsonism; understand this is a very unlikely contributor, discussed risks versus benefits with recent GI  bleed, they continue to request removal of medication.  -1/31:  Protonix Dced; will start famotidine 20 mg twice daily as alternative. Add CBC in AM, FOBT.  -2-3: Hemoglobin stable.  Large bowel movement this a.m. will DC FOBT.    - Stable on current medications  2-10: DC Pepcid due to AKI as above.  Hemoglobin remained stable.   9. Right hemothorax s/p CT/fibrinolysis: Has been weaned off oxygen             --follow up with Dr. Irena Cords in 2 months w/repeat CT chest   - Follow-up chest x-ray without ongoing findings.  10. Delirium/intermittent movement d/o: Improved/resolved  11. PAF/SVT/HTN: HR improved with titrated of coreg to 12.5 mg bid.  -1/29: Continues intermittently refusing medications, heart rate well-controlled, blood pressure rising.  Resume home olmesartan at low-dose 5 mg; substituted for formulary Avapro 37.5 mg daily.  Add hydralazine 10 mg every 8 hours as needed for systolic blood pressure greater than 180, diastolic greater than 110  1-30: Blood pressure remains elevated, but he only just stated taking his medications today.  Family to encourage compliance, BNP today downtrending from prior so unlikely heart failure exacerbation.  2/3 BUN/creatinine improving, is now taking medications consistently, increase Avapro to 75 mg daily to approximate olmesartan 10 mg -  start 2/4  2/5: Some mild hypotension yesterday, hypertension overnight, will monitor today for any symptomatic orthostasis.  2-6: Some increasing peripheral edema, blood pressures are stable.  Will recommend Ace wrapping right lower extremity, due to limitations of left lower extremity due to skin tear.  2/7: Reduce metoprolol to 6.25 BID due to HR 40s overnight. DC flomax as below.   2/9 HR only dropping into 50's--continue current dosing  2/10: Hypertensive overnight, with worsening AKI; likely contractive, will give gentle IVF today 75 cc/hr for     2/10: Cr increased again, DC mybetriq and Pepcid.  BNP/troponins minimally elevated 2-9, will check BNP again today (approximately stable) last echo 12/28 with normal EF< mildly reduced R systolic function.   2-11: See management of AKI below.  DC irbesartan per nephrology recommendations; has been a little bit high, but has as needed hydralazine if needed  2/12: add back flomax as below; may need amlodipine added    11/10/2023    5:12 AM 11/09/2023    7:56 PM 11/09/2023    1:29 PM  Vitals with BMI  Systolic 164 142 161  Diastolic 97 97 69  Pulse 76 81 55     12.  Hypokalemia/Hypomagnesemia/hypocalcemia: K+ 3.1 this AM- was given KCL- will recheck in AM   - 1/27: K stable/improved; will start Oscal 500 mg BID for Ca 7.8  1-28: K3.3, start 20 mill equivalents potassium twice daily.  Retest later this week.  1-30: Potassium unchanged, resume twice daily repletion.  4 g IV mag as above Calcium repleted  2-3: Potassium repleted.  Continue current regimen.  Magnesium added on to a.m. labs late--resolved  13. HCAP: Treated with 7 day course of Zosyn   - No respiratory distress  -1-30: Chest x-ray today--no acute intrapulmonary process.  Pleural effusions resolved.  14. Gross hematuria/dysuria/incontinence/hypogastric pain: Per patient report, likely due to traumatic cath as inpatient.  To follow up with Dr. Annabell Howells on outpatient basis. On flomax  0.4 mg daily.   -1-29: High proteinuria and some blood in urinalysis; negative for infection.  Consistent with prior urinalysis  2-4: Recurrent.  Was micro hemoglobinuria on 2-1, CT abdomen and pelvis was canceled as  below.  Will get new urinalysis/culture today, renal ultrasound to evaluate, DC Prozac as above due to potential bleed risk with Pradaxa.Patient reporting possible urethral injury while inpatient contributing.   2/5: Renal ultrasound yesterday showing solid region within the bladder 1.3 x 1.1 x 1.4 cm; blood clot versus mass without positive Doppler.  Also found nonobstructing calculus 1.5 cm in left lower renal pelvis, along with left renal cysts.  Hematuria improving with discontinuation of Prozac; can continue conservative management and follow-up outpatient with urology Dr. Broadus John as previously planned by internal medicine.   - Unfortunately, cannot initiate Pyridium due to CKD, will add Robaxin 500 mg every 8 hours as needed. 2/6: Low PVRs, improved appearance of urine.  Continue conservative management and outpatient follow-up with Dr. Annabell Howells as previously planned 2/7: DC flomax due to ongoing incontinence with low PVRs -monitor PVRs over this weekend to ensure no retention  2/8 pt still with urinary frequency and a lot of discomfort.   -off flomax, voiding completely it appears, recent ucx neg  -will try myrbetriq and observe for response this weekend,  -oxycodone for pain   2/9 pt with ongoing dysuria, hypogastric discomfort   -increase myrbetriq to 50mg    -repeat ucx today, check kub   -may need urology consult tomorrow if no improvement    - 2-10: Had single ISC for retention greater than 500 yesterday afternoon; DC Myrbetriq.  Continue PVRs.  Urine culture with small bacteria, no leukocytes or nitrates, remains large hemoglobin (likely from clot and multiple traumatic caths ) and protein with casts (consistent with priors).  KUB unremarkable.   2-11: No further documented  high PVRs/urinary retention.  Per documentation of I's and O's.  Getting abdominal ultrasound today for left lower quadrant fullness;?  Rectus sheath hematoma given seems to be more surface level and has had continued bleeding problems with Brilinta.  Results pending.  Repeating urinalysis today   - Reviewed prior ultrasounds, bladder mass not noted on [place] 05/2022 or 08/2023     2/12: Adding flomax 0.4 mg daily back for urinary retention, ISC. Cr remains uptrending, abdominal ultrasound not helpful, will d/w nephrology abdominal CT w/o contrast today.  Holding home Pradaxa due to concern for rectus hematoma.  15. T2DM: Hgb A1c- 5.5. Diet controlled at this time             --has been off actos.   16. Acute on chronic renal failure -CKD stage IIIb:   Baseline per OP records appear 1.2-1.5.  --Followed by Dr. Zetta Bills w/plan for renal bx prior to admission.              --encourage fluid intake- Cr 1.33 this AM up from 1.0- will recheck in Ama nd push fluids.   1/27: Continues uptrending to 1.5 today; Is only 240 yesterday; Push PO fluids and give 500 cc bolus today, check in AM  1-28: Creatinine improved to 1.3.  P.o. intakes remain poor, will continue daily monitoring and bolus as needed  1-30: Resume IV fluids today for AKI.  BMP in AM.  1/31: Received 500 cc overnight before pulling IV, creatinine uptrending.  Getting CT abdomen and pelvis today due to concern for renal stone as above.  2-3: Creatinine improved back to 1.5; increasing ACE inhibitor as above.  2/7: BUN, cr mildly increased, 27/1.69 - encourage PO fluids, recheck Monday  2/9 BUN increased to 32-   -will give 500 cc iv 1/2ns today   -recheck in AM  2/10: Cr  increased again, DC mybetriq and Pepcid due to worsening AKI; will give 1 L IV fluid today normal saline at 75 cc/h given stable BNP.  Repeat labs this p.m. with increasing creatinine, however had not gotten much fluid at that time.  2-11: Creatinine uptrending to 3.15 today,  nephrology consulted, appreciate their insight and recommendations.  Per their assessment, still likely prerenal, will get 100 cc LR per hour today, DC irbesartan, adjust Brilinta for renal dosing.  Abdominal ultrasound and repeat urinalysis pending.  2/12: Cr up to 3.5 despite IVF; appreciate nephrology asisstance--recommending continued IV fluid, appreciating slower increase in creatinine today.. Adding flomax, CT abdomen, admitting to inpatient for further workup.  17. Hx Pancytopenia:  Bone bx w/ ICUS (brewing myelodysplasia) per Dr. Leonides Schanz.    - 1/27: WBC down slightly; monitor--stable 2-3  Stable 2/10   LOS: 17 days A FACE TO FACE EVALUATION WAS PERFORMED  Angelina Sheriff 11/10/2023, 8:48 AM

## 2023-11-10 NOTE — Plan of Care (Signed)

## 2023-11-10 NOTE — Progress Notes (Signed)
S:Continues to have abdominal pain this morning. He states it is more concentrated around the suprapubic area this morning. He has not stooled since yesterday. He is wanting to go home, but he is amenable to further workup of his abdominal pain and kidney decline. O:BP (!) 164/97 (BP Location: Left Arm)   Pulse 76   Temp 97.6 F (36.4 C) (Oral)   Resp 18   Ht 5' 7.99" (1.727 m)   Wt 226 lb 6.6 oz (102.7 kg)   SpO2 96%   BMI 34.43 kg/m   Intake/Output Summary (Last 24 hours) at 11/10/2023 0825 Last data filed at 11/10/2023 0754 Gross per 24 hour  Intake 0 ml  Output 850 ml  Net -850 ml   Intake/Output: I/O last 3 completed shifts: In: -  Out: 850 [Urine:850]  Intake/Output this shift:  No intake/output data recorded. Weight change:  ZOX:WRUEA in bed, alert, NAD CVS:RRR without m/r/g Resp:CTAB anteriorly without w/r/r VWU:JWJXBJ tender diffusely more so in LLQ YNW:GNFAO all grossly equally  Recent Labs  Lab 11/04/23 0537 11/07/23 0040 11/08/23 0535 11/08/23 1931 11/09/23 0507 11/10/23 0523  NA 140 136 139 136 138 139  K 4.4 4.3 4.5 4.7 3.9 4.3  CL 106 105 102 103 102 105  CO2 22 22 17* 20* 19* 17*  GLUCOSE 93 104* 108* 96 92 81  BUN 27* 32* 34* 35* 35* 38*  CREATININE 1.69* 1.51* 2.01* 2.81* 3.15* 3.51*  CALCIUM 9.0 8.6* 9.8 9.0 9.2 8.9   Liver Function Tests: No results for input(s): "AST", "ALT", "ALKPHOS", "BILITOT", "PROT", "ALBUMIN" in the last 168 hours. No results for input(s): "LIPASE", "AMYLASE" in the last 168 hours. Recent Labs  Lab 11/09/23 1037  AMMONIA 38*   CBC: Recent Labs  Lab 11/04/23 0537 11/07/23 0040 11/08/23 0535  WBC 3.3* 3.4* 3.4*  HGB 8.5* 8.2* 10.0*  HCT 27.6* 26.4* 31.8*  MCV 99.3 98.5 98.5  PLT 115* 115* 126*   Cardiac Enzymes: No results for input(s): "CKTOTAL", "CKMB", "CKMBINDEX", "TROPONINI" in the last 168 hours. CBG: No results for input(s): "GLUCAP" in the last 168 hours.  Iron Studies: No results for input(s):  "IRON", "TIBC", "TRANSFERRIN", "FERRITIN" in the last 72 hours. Studies/Results: US Abdomen Complete Result Date: 11/09/2023 CLINICAL DATA:  Abdominal pain EXAM: ABDOMEN ULTRASOUND COMPLETE COMPARISON:  Right upper quadrant ultrasound 10/06/2023 and older. Renal ultrasound 11/02/2023. FINDINGS: Gallbladder: Gallbladder is mildly distended but there is significant gallstones. No wall thickening or adjacent fluid. Common bile duct: Diameter: 5 mm Liver: No focal lesion identified. Within normal limits in parenchymal echogenicity. Portal vein is patent on color Doppler imaging with normal direction of blood flow towards the liver. IVC: No abnormality visualized. Pancreas: Poorly seen with overlapping bowel gas and soft tissue. Spleen: Enlarged at 13.6 cm Right Kidney: Length: 12.1 cm. No collecting system dilatation. There is a small anechoic structure in the right kidney measuring 13 mm consistent with a benign cyst. Left Kidney: Length: 12.1 cm in length. No collecting system dilatation with the left kidney is poorly seen with overlapping bowel gas and soft tissue. Dominant cyst identified once again measuring up to 11.4 cm. Abdominal aorta: Overall poorly seen. Other findings: Extremely limited examination for several structures with the overlapping bowel gas and soft tissue. If needed additional cross-sectional imaging study such as CT could be considered as clinically appropriate pleural effusion. IMPRESSION: Gallstones again seen.  No ductal dilatation. Splenomegaly. Renal cysts. Several structures are poorly seen with overlapping bowel gas and soft tissue. If  there is further concern additional cross-sectional imaging study such as CT may be useful in this situation for further delineation. Electronically Signed   By: Karen Kays M.D.   On: 11/09/2023 17:32    acetaminophen  650 mg Oral TID WC & HS   calcium carbonate  500 mg of elemental calcium Oral BID WC   carvedilol  6.25 mg Oral BID WC    dabigatran  75 mg Oral Q12H   feeding supplement  1 Container Oral BID BM   folic acid  1 mg Oral Daily   isosorbide mononitrate  30 mg Oral Daily   melatonin  5 mg Oral QHS   rosuvastatin  20 mg Oral Daily   simethicone  80 mg Oral QID   traZODone  50 mg Oral QHS    BMET    Component Value Date/Time   NA 139 11/10/2023 0523   NA 139 05/11/2023 1007   K 4.3 11/10/2023 0523   CL 105 11/10/2023 0523   CO2 17 (L) 11/10/2023 0523   GLUCOSE 81 11/10/2023 0523   BUN 38 (H) 11/10/2023 0523   BUN 24 05/11/2023 1007   CREATININE 3.51 (H) 11/10/2023 0523   CREATININE 1.34 (H) 03/18/2023 1026   CALCIUM 8.9 11/10/2023 0523   GFRNONAA 18 (L) 11/10/2023 0523   GFRNONAA 56 (L) 03/18/2023 1026   GFRAA >60 04/26/2018 0901   CBC    Component Value Date/Time   WBC 3.4 (L) 11/08/2023 0535   RBC 3.23 (L) 11/08/2023 0535   HGB 10.0 (L) 11/08/2023 0535   HGB 13.1 03/18/2023 1026   HCT 31.8 (L) 11/08/2023 0535   PLT 126 (L) 11/08/2023 0535   PLT 134 (L) 03/18/2023 1026   MCV 98.5 11/08/2023 0535   MCH 31.0 11/08/2023 0535   MCHC 31.4 11/08/2023 0535   RDW 19.7 (H) 11/08/2023 0535   LYMPHSABS 0.9 10/31/2023 0614   MONOABS 0.3 10/31/2023 0614   EOSABS 0.1 10/31/2023 0614   BASOSABS 0.0 10/31/2023 0614     Assessment/Plan:  AKI with hematuria on CKD3b: Baseline appears around 1.3-1.4. Cr steadily increasing up to 3.15>3.51 this AM despite around 1.5 L fluids by primary team 2/10 and 1.2 L IVF yesterday 2/11. UOP documented at 850 mL (0.3 mL/kg/hr) yesterday, placing him in the oliguric range based on documentation. Findings could be suggestive of ATN, which could have occurred from his decreased PO intake. UA still pending, so we will add on urinary Na and urinary creatinine to further elucidate renal function. Unlikely infectious, obstructive, or fluid overload causes based on history, exam, and studies. Given continued decreased intake, will continue IVF today. Continue to hold  ARB. Hematuria: Awaiting updated UA today. Renal US with mass in the bladder which could be contributing. Continue to recommend cystoscopy at some point. Unlikely renal stone given diffuse abdominal pain (vs focal/flank pain) and nonobstructive nature as seen on abd [place]. Hypokalemia, hypomagnesemia, hypocalcemia: Resolved. Monitor as clinically needed. Abdominal pain: Abd [place] with splenomegaly, gallstones with no ductal dilation, renal cysts, and several structures poorly seen with overlapping bowel gas and soft tissue. Discussed with primary team and will proceed with non-contrast CTAP for further evaluation. Functional deficits 2/2 debility from GI bleed/delirium: Working with inpatient rehabilitation with improvement in functional status. PPI discontinued at family request. H2B held at this time due to worsening AKI.  No evidence of asterixis yesterday, and ammonia levels mildly elevated without concern for overt hyperammonemia. PAF/SVT/HTN: Per primary team. Continuing home medications.  Branden Tab Rylee,  MD Family Medicine Resident, PGY-2 8:25 AM 11/10/2023  [facility] [phone]

## 2023-11-10 NOTE — Progress Notes (Signed)
Was told patient arrived to room. Report just received.

## 2023-11-10 NOTE — Progress Notes (Signed)
Inpatient Rehabilitation Discharge Medication Review by a Pharmacist  A complete drug regimen review was completed for this patient to identify any potential clinically significant medication issues.  High Risk Drug Classes Is patient taking? Indication by Medication  Antipsychotic No   Anticoagulant Yes Dabigatran - Afib  Antibiotic No   Opioid Yes Tramadol - prn pain  Antiplatelet No   Hypoglycemics/insulin No   Vasoactive Medication Yes Carvedilol, hydralazine prn - HTN Imdur - CAD Tamsulosin - retention  Chemotherapy No   Other Yes Folic acid - supplement Viscous lidocaine - prn mouth pain Ondansetron - prn N/V Rosuvastatin - HLD Trazodone - prn sleep     Type of Medication Issue Identified Description of Issue Recommendation(s)  Drug Interaction(s) (clinically significant)     Duplicate Therapy     Allergy     No Medication Administration End Date     Incorrect Dose     Additional Drug Therapy Needed     Significant med changes from prior encounter (inform family/care partners about these prior to discharge).    Other       Clinically significant medication issues were identified that warrant physician communication and completion of prescribed/recommended actions by midnight of the next day:  No  Pharmacist comments: Back to acute for abdominal pain  Time spent performing this drug regimen review (minutes):  20 minutes  Thank you Okey Regal, PharmD 11/10/2023 3:06 PM

## 2023-11-10 NOTE — Progress Notes (Signed)
Inpatient Rehabilitation Care Coordinator Discharge Note   Patient Details  Name: Joshua [name] MRN: [id number] Date of Birth: 1951-06-26   Discharge location: D/c to acute due to medical reasons  Length of Stay: 17 days  Discharge activity level: Supervision  Home/community participation: Limited  Patient response ZD:GLOVFI Literacy - How often do you need to have someone help you when you read instructions, pamphlets, or other written material from your doctor or pharmacy?: Never  Patient response EP:PIRJJO Isolation - How often do you feel lonely or isolated from those around you?: Patient unable to respond  Services provided included: MD, RD, PT, OT, TR, Pharmacy, Neuropsych, SW, RN, CM, SLP  Financial Services:  Field seismologist Utilized: Private Insurance [employer]  Choices offered to/list presented to: patient and pt wife  Follow-up services arranged:  Home Health, Patient/Family has no preference for HH/DME agencies, DME Home Health Agency: [facility] for HHPT/OT/SLP/aide    DME : [employer]- shower chair with back    Patient response to transportation need: Is the patient able to respond to transportation needs?: Yes In the past 12 months, has lack of transportation kept you from medical appointments or from getting medications?: No In the past 12 months, has lack of transportation kept you from meetings, work, or from getting things needed for daily living?: No   Patient/Family verbalized understanding of follow-up arrangements:  Yes  Individual responsible for coordination of the follow-up plan: contact pt wife  Confirmed correct DME delivered: Gretchen Short 11/10/2023    Comments (or additional information):fam edu completed  Summary of Stay    Date/Time Discharge Planning CSW  11/09/23 0905 Pt will discharge to home with wife as primary caregiver. Pt needs to be able to get up and ambulate on his own. PRN support from his children.  HHA- [facility] for HHPT/OT/SLP/aide. Fam edu completed. SW will confirm there are no barriers to discharge. AAC  11/01/23 1508 Pt will discharge to home with wife as primary caregiver. Pt needs to be able to get up and ambulate on his own. PRN support from his children. SW will confirm there are no barriers to discharge. AAC  10/25/23 1213 Pt will discharge to home with wife as primary caregiver. Pt needs to be able to get up and ambulate on his own. PRN support from his children. SW will confirm there are no barriers to discharge. AAC       Naftoli Penny A Lula Olszewski

## 2023-11-10 NOTE — Discharge Summary (Signed)
Physician Discharge Summary  Patient ID: Joshua [name] MRN: [id number] DOB/AGE: 1951-03-13 73 y.o.  Admit date: 10/24/2023 Discharge date: 11/10/2023  Discharge Diagnoses:  Principal Problem:   Debility Active Problems:   Type 2 diabetes mellitus (HCC)   Paroxysmal atrial fibrillation (HCC)   Anemia of chronic disease   Gastrointestinal hemorrhage with melena   Thrombocytopenia (HCC)   Acute on chronic renal failure (HCC)   CKD stage 3a, GFR 45-59 ml/min (HCC)   Need for emotional support   Morbid obesity (HCC)   Cognitive and neurobehavioral dysfunction   Neutropenia (HCC)   Gastric AVM   Increased ammonia level   Discharged Condition: {condition:18240}  Significant Diagnostic Studies: US Abdomen Complete Result Date: 11/09/2023 CLINICAL DATA:  Abdominal pain EXAM: ABDOMEN ULTRASOUND COMPLETE COMPARISON:  Right upper quadrant ultrasound 10/06/2023 and older. Renal ultrasound 11/02/2023. FINDINGS: Gallbladder: Gallbladder is mildly distended but there is significant gallstones. No wall thickening or adjacent fluid. Common bile duct: Diameter: 5 mm Liver: No focal lesion identified. Within normal limits in parenchymal echogenicity. Portal vein is patent on color Doppler imaging with normal direction of blood flow towards the liver. IVC: No abnormality visualized. Pancreas: Poorly seen with overlapping bowel gas and soft tissue. Spleen: Enlarged at 13.6 cm Right Kidney: Length: 12.1 cm. No collecting system dilatation. There is a small anechoic structure in the right kidney measuring 13 mm consistent with a benign cyst. Left Kidney: Length: 12.1 cm in length. No collecting system dilatation with the left kidney is poorly seen with overlapping bowel gas and soft tissue. Dominant cyst identified once again measuring up to 11.4 cm. Abdominal aorta: Overall poorly seen. Other findings: Extremely limited examination for several structures with the overlapping bowel gas and soft tissue. If  needed additional cross-sectional imaging study such as CT could be considered as clinically appropriate pleural effusion. IMPRESSION: Gallstones again seen.  No ductal dilatation. Splenomegaly. Renal cysts. Several structures are poorly seen with overlapping bowel gas and soft tissue. If there is further concern additional cross-sectional imaging study such as CT may be useful in this situation for further delineation. Electronically Signed   By: Karen Kays M.D.   On: 11/09/2023 17:32   DG Abd 1 View Result Date: 11/07/2023 CLINICAL DATA:  Abdominal pain EXAM: ABDOMEN - 1 VIEW COMPARISON:  10/25/2023 FINDINGS: Three supine frontal views of the abdomen and pelvis are obtained. No bowel obstruction or ileus. Moderate gaseous distention of the stomach. No masses or abnormal calcifications. Patchy right basilar consolidation and small right effusion. No acute bony abnormalities. IMPRESSION: 1. No bowel obstruction or ileus. 2. Patchy right basilar consolidation and small right effusion. Electronically Signed   By: Sharlet Salina M.D.   On: 11/07/2023 17:29   US RENAL Result Date: 11/02/2023 CLINICAL DATA:  Hematuria EXAM: RENAL / URINARY TRACT ULTRASOUND COMPLETE COMPARISON:  None Available. FINDINGS: Right Kidney: Renal measurements: 12.0 x 6.3 x 5.2 cm = volume: 204 mL. Echogenicity within normal limits. No mass or hydronephrosis visualized. Left Kidney: Renal measurements: 12.3 x 6.3 x 6.0 cm = volume: 242 mL. Fall 1.5 cm shadowing calculus in the lower renal pelvis. There is a interpolar cyst measuring 2.1 x 1.8 x 1.7 cm. Lower pole cyst measures 9.9 x 5.9 x 10.3 cm. Bladder: Solid-appearing region within the urinary bladder wall measures 1.3 x 1.1 x 1.4 cm. No blood flow demonstrated with color Doppler. Other: None. IMPRESSION: 1. Solid-appearing region within the urinary bladder wall measures 1.3 x 1.1 x 1.4 cm. This could  be blood clot or a mass, though no vascularity was demonstrated. Cystoscopy would be  helpful to differentiate. 2. Nonobstructing 1.5 cm calculus in the lower left renal pelvis. 3. Left renal cysts. Electronically Signed   By: Deatra Robinson M.D.   On: 11/02/2023 20:13   DG Chest 2 View Result Date: 10/28/2023 CLINICAL DATA:  [id number] Pleural effusion [id number] EXAM: CHEST - 2 VIEW COMPARISON:  Chest x-ray 10/18/2023 FINDINGS: The heart and mediastinal contours are unchanged. Coronary artery stent. Low lung volumes. No focal consolidation. No pulmonary edema. No pleural effusion. No pneumothorax. No acute osseous abnormality. IMPRESSION: Low lung volumes with no active cardiopulmonary disease. Electronically Signed   By: Tish Frederickson M.D.   On: 10/28/2023 18:53   DG Abd 1 View Result Date: 10/25/2023 CLINICAL DATA:  Abdominal pain EXAM: ABDOMEN - 1 VIEW COMPARISON:  None Available. FINDINGS: Scattered large and small bowel gas is noted. No obstructive changes are seen. No free air is noted. Mild degenerative changes of lumbar spine are seen. IMPRESSION: No acute abnormality noted. Electronically Signed   By: Alcide Clever M.D.   On: 10/25/2023 21:13   CT HEAD WO CONTRAST ( ) Result Date: 10/18/2023 CLINICAL DATA:  Initial evaluation for mental status change, unknown cause. EXAM: CT HEAD WITHOUT CONTRAST TECHNIQUE: Contiguous axial images were obtained from the base of the skull through the vertex without intravenous contrast. RADIATION DOSE REDUCTION: This exam was performed according to the departmental dose-optimization program which includes automated exposure control, adjustment of the mA and/or kV according to patient size and/or use of iterative reconstruction technique. COMPARISON:  Prior CT from 10/08/2023. FINDINGS: Brain: Mild age-related cerebral atrophy with chronic small vessel ischemic disease. No acute intracranial hemorrhage. No acute large vessel territory infarct. No mass lesion, midline shift or mass effect. No hydrocephalus or extra-axial fluid collection. Vascular: No  abnormal hyperdense vessel. Calcified atherosclerosis present at the skull base. Skull: Scalp soft tissues within normal limits.  Calvarium intact. Sinuses/Orbits: Globes and orbital soft tissues within normal limits. Paranasal sinuses are clear. No mastoid effusion. Other: None. IMPRESSION: 1. No acute intracranial abnormality. 2. Mild age-related cerebral atrophy with chronic small vessel ischemic disease. Electronically Signed   By: Rise Mu M.D.   On: 10/18/2023 20:18   DG CHEST PORT 1 VIEW Result Date: 10/18/2023 CLINICAL DATA:  Fever. EXAM: PORTABLE CHEST 1 VIEW COMPARISON:  Chest x-ray dated October 16, 2023. FINDINGS: Stable cardiomediastinal silhouette. Normal pulmonary vascularity. Unchanged small loculated right pleural effusion with fluid tracking along the upper mediastinum and minor fissure. Similar right hemithorax volume loss with elevation of the right hemidiaphragm. No pneumothorax. No acute osseous abnormality. IMPRESSION: 1. Unchanged small loculated right pleural effusion. Electronically Signed   By: Obie Dredge M.D.   On: 10/18/2023 11:49   EEG adult Result Date: 10/18/2023 Charlsie Quest, MD     10/18/2023 11:48 AM Patient Name: Joshua [name] MRN: [id number] Epilepsy Attending: Charlsie Quest Referring Physician/Provider: Leatha Gilding, MD Date: 10/18/2023 Duration: 25.28 mins Patient history: 73yo M with ams getting eeg to evaluate for seizure Level of alertness: Awake, asleep AEDs during EEG study: None Technical aspects: This EEG study was done with scalp electrodes positioned according to the 10-20 International system of electrode placement. Electrical activity was reviewed with band pass filter of 1-70Hz , sensitivity of 7 uV/mm, display speed of 45mm/sec with a 60Hz  notched filter applied as appropriate. EEG data were recorded continuously and digitally stored.  Video monitoring was available and reviewed as  appropriate. Description: The posterior dominant  rhythm consists of 8 Hz activity of moderate voltage (25-35 uV) seen predominantly in posterior head regions, symmetric and reactive to eye opening and eye closing. Sleep was characterized by vertex waves, sleep spindles (12 to 14 Hz), maximal frontocentral region. Physiologic photic driving was not seen during photic stimulation. Hyperventilation was not performed.   IMPRESSION: This study is within normal limits. No seizures or epileptiform discharges were seen throughout the recording. A normal interictal EEG does not exclude the diagnosis of epilepsy. Charlsie Quest   DG Chest Port 1 View Result Date: 10/16/2023 CLINICAL DATA:  73 year old male with history of pleural effusion. EXAM: PORTABLE CHEST 1 VIEW COMPARISON:  Chest x-ray 10/15/2023. FINDINGS: Previously noted small bore right-sided chest tube has been removed. Elevation of the right hemidiaphragm again noted. Lung volumes are normal. No confluent consolidative airspace disease. Small right pleural effusion, some of which tracks along the right chest wall and into the minor fissure, similar to the prior study. No left pleural effusion. No pneumothorax. No evidence of pulmonary edema. Heart size appears borderline enlarged. Prominence of the right paratracheal contour, similar to the prior study, likely reflecting residual loculated pleural fluid. IMPRESSION: 1. Interval removal of right-sided chest tube with no evidence of pneumothorax. 2. Persistent small right pleural effusion, some of which appears loculated, as above. 3. Borderline cardiomegaly. Electronically Signed   By: Trudie Reed M.D.   On: 10/16/2023 08:04   DG CHEST PORT 1 VIEW Result Date: 10/15/2023 CLINICAL DATA:  Pleural effusion EXAM: PORTABLE CHEST 1 VIEW COMPARISON:  10/08/2023, CT 10/10/2023 FINDINGS: Left lung is grossly clear. Cardiomegaly. Small right-sided pleural effusion with loculation. Stable positioning of right lung base chest tube. No appreciable pneumothorax on  this exam IMPRESSION: Stable positioning of right lung base chest tube. No appreciable pneumothorax on this exam. Small right-sided pleural effusion with loculation without great interval change compared to prior radiograph. Electronically Signed   By: Jasmine Pang M.D.   On: 10/15/2023 19:10    Labs:  Basic Metabolic Panel: Recent Labs  Lab 11/04/23 0537 11/07/23 0040 11/08/23 0535 11/08/23 1931 11/09/23 0507 11/10/23 0523  NA 140 136 139 136 138 139  K 4.4 4.3 4.5 4.7 3.9 4.3  CL 106 105 102 103 102 105  CO2 22 22 17* 20* 19* 17*  GLUCOSE 93 104* 108* 96 92 81  BUN 27* 32* 34* 35* 35* 38*  CREATININE 1.69* 1.51* 2.01* 2.81* 3.15* 3.51*  CALCIUM 9.0 8.6* 9.8 9.0 9.2 8.9    CBC: Recent Labs  Lab 11/04/23 0537 11/07/23 0040 11/08/23 0535  WBC 3.3* 3.4* 3.4*  HGB 8.5* 8.2* 10.0*  HCT 27.6* 26.4* 31.8*  MCV 99.3 98.5 98.5  PLT 115* 115* 126*    CBG: No results for input(s): "GLUCAP" in the last 168 hours.  Brief HPI:   Joshua [name] is a 73 y.o. male ***   Hospital Course: Joshua [name] was admitted to rehab 10/24/2023 for inpatient therapies to consist of PT, ST and OT at least three hours five days a week. Past admission physiatrist, therapy team and rehab RN have worked together to provide customized collaborative inpatient rehab.   Blood pressures were monitored on TID basis and   Diabetes has been monitored with ac/hs CBG checks and SSI was use prn for tighter BS control.    Rehab course: During patient's stay in rehab weekly team conferences were held to monitor patient's progress, set goals and discuss barriers  to discharge. At admission, patient required  He  has had improvement in activity tolerance, balance, improvement in balance, postural control as well as improvement in attention, awareness and coordination.  He is able to complete ADL tasks with supervision. He requires supervision for transfers and to ambulate 350' with use of RW. He is able to climb  12 stairs with CGA. He requires supervision with mildly complex tasks. Family education has been completed.    Diet: Heart health/Carb Modified    Medications at discharge.   acetaminophen  650 mg Oral TID WC & HS   calcium carbonate  500 mg of elemental calcium Oral BID WC   carvedilol  6.25 mg Oral BID WC   feeding supplement  1 Container Oral BID BM   folic acid  1 mg Oral Daily   isosorbide mononitrate  30 mg Oral Daily   melatonin  5 mg Oral QHS   rosuvastatin  20 mg Oral Daily   simethicone  80 mg Oral QID   tamsulosin  0.4 mg Oral Daily   traZODone  50 mg Oral QHS     Disposition:  There are no questions and answers to display.           Special Instructions:   Allergies as of 11/10/2023       Reactions   Desipramine Hives, Itching   Niacin Itching     Med Rec must be completed prior to using this [facility]***       Follow-up Information     [name], MD Follow up.   Specialty: Internal Medicine Contact information: [address] [place] [address] [phone]         Dagoberto Ligas, MD Follow up.   Specialties: Nephrology, Vascular Surgery Contact information: [address] [place] [address] [phone]         Hilarie Fredrickson, MD Follow up.   Specialty: Gastroenterology Contact information: [address] [place] [address] [phone]         Martina Sinner, MD Follow up.   Specialty: Pulmonary Disease Contact information: [address] [place] [address] [phone]         Bjorn Pippin, MD Follow up.   Specialty: Urology Contact information: [address] [place] [address] [phone]                 Signed: Jacquelynn Cree 11/10/2023, 11:44 AM

## 2023-11-11 ENCOUNTER — Inpatient Hospital Stay (HOSPITAL_COMMUNITY): Payer: Medicare HMO

## 2023-11-11 DIAGNOSIS — N179 Acute kidney failure, unspecified: Secondary | ICD-10-CM | POA: Diagnosis not present

## 2023-11-11 DIAGNOSIS — N1831 Chronic kidney disease, stage 3a: Secondary | ICD-10-CM | POA: Diagnosis not present

## 2023-11-11 LAB — URINALYSIS, ROUTINE W REFLEX MICROSCOPIC
Bilirubin Urine: NEGATIVE
Glucose, UA: NEGATIVE mg/dL
Ketones, ur: NEGATIVE mg/dL
Nitrite: NEGATIVE
Protein, ur: >=300 mg/dL — AB
Specific Gravity, Urine: 1.011 (ref 1.005–1.030)
WBC, UA: >50 WBC/hpf (ref 0–5)
pH: 5 (ref 5.0–8.0)

## 2023-11-11 LAB — CBC
HCT: 32.6 % — ABNORMAL LOW (ref 39.0–52.0)
Hemoglobin: 10.3 g/dL — ABNORMAL LOW (ref 13.0–17.0)
MCH: 31.2 pg (ref 26.0–34.0)
MCHC: 31.6 g/dL (ref 30.0–36.0)
MCV: 98.8 fL (ref 80.0–100.0)
Platelets: 123 10*3/uL — ABNORMAL LOW (ref 150–400)
RBC: 3.3 MIL/uL — ABNORMAL LOW (ref 4.22–5.81)
RDW: 19.2 % — ABNORMAL HIGH (ref 11.5–15.5)
WBC: 4.1 10*3/uL (ref 4.0–10.5)
nRBC: 0 % (ref 0.0–0.2)

## 2023-11-11 LAB — AMMONIA: Ammonia: 25 umol/L (ref 9–35)

## 2023-11-11 LAB — BRAIN NATRIURETIC PEPTIDE: B Natriuretic Peptide: 422.3 pg/mL — ABNORMAL HIGH (ref 0.0–100.0)

## 2023-11-11 LAB — BASIC METABOLIC PANEL
Anion gap: 16 — ABNORMAL HIGH (ref 5–15)
BUN: 41 mg/dL — ABNORMAL HIGH (ref 8–23)
CO2: 15 mmol/L — ABNORMAL LOW (ref 22–32)
Calcium: 8.3 mg/dL — ABNORMAL LOW (ref 8.9–10.3)
Chloride: 107 mmol/L (ref 98–111)
Creatinine, Ser: 3.99 mg/dL — ABNORMAL HIGH (ref 0.61–1.24)
GFR, Estimated: 15 mL/min — ABNORMAL LOW (ref >60)
Glucose, Bld: 74 mg/dL (ref 70–99)
Potassium: 5 mmol/L (ref 3.5–5.1)
Sodium: 138 mmol/L (ref 135–145)

## 2023-11-11 LAB — GLUCOSE, CAPILLARY
Glucose-Capillary: 75 mg/dL (ref 70–99)
Glucose-Capillary: 78 mg/dL (ref 70–99)
Glucose-Capillary: 108 mg/dL — ABNORMAL HIGH (ref 70–99)
Glucose-Capillary: 77 mg/dL (ref 70–99)

## 2023-11-11 LAB — CREATININE, URINE, RANDOM: Creatinine, Urine: 93 mg/dL

## 2023-11-11 LAB — SODIUM, URINE, RANDOM: Sodium, Ur: 54 mmol/L

## 2023-11-11 LAB — PHOSPHORUS: Phosphorus: 6 mg/dL — ABNORMAL HIGH (ref 2.5–4.6)

## 2023-11-11 MED ORDER — DABIGATRAN ETEXILATE MESYLATE 75 MG PO CAPS
75.0000 mg | ORAL_CAPSULE | Freq: Two times a day (BID) | ORAL | Status: DC
  Administered 2023-11-11 – 2023-11-14 (×7): 75 mg via ORAL
  Filled 2023-11-11 (×8): qty 1

## 2023-11-11 MED ORDER — CEPHALEXIN 500 MG PO CAPS
500.0000 mg | ORAL_CAPSULE | Freq: Three times a day (TID) | ORAL | Status: DC
  Administered 2023-11-11 – 2023-11-14 (×9): 500 mg via ORAL
  Filled 2023-11-11 (×10): qty 1

## 2023-11-11 MED ORDER — CHLORHEXIDINE GLUCONATE CLOTH 2 % EX PADS
6.0000 | MEDICATED_PAD | Freq: Every day | CUTANEOUS | Status: DC
  Administered 2023-11-11 – 2023-11-14 (×4): 6 via TOPICAL

## 2023-11-11 MED ORDER — HYDRALAZINE HCL 50 MG PO TABS
50.0000 mg | ORAL_TABLET | Freq: Three times a day (TID) | ORAL | Status: DC
  Administered 2023-11-11 – 2023-11-14 (×7): 50 mg via ORAL
  Filled 2023-11-11 (×11): qty 1

## 2023-11-11 NOTE — Plan of Care (Signed)
  Problem: RH Stairs Goal: LTG Patient will ambulate up and down stairs w/assist (PT) Description: LTG: Patient will ambulate up and down # of stairs with assistance (PT) Outcome: Not Applicable Note: Discontinued - entered in error

## 2023-11-11 NOTE — Evaluation (Signed)
Physical Therapy Evaluation Patient Details Name: Joshua [name] MRN: [id number] DOB: 04/17/1951 Today's Date: 11/11/2023  History of Present Illness  Pt is 73 yo male presenting from CIR on 11/10/23 with AKI. PMH:  hypertension, hyperlipidemia, CAD, diabetes, anemia, CKD 3, atrial fibrillation, pancytopenia, sleep disturbance, delirium, obesity, status post laminectomy, GI bleed  Clinical Impression  Pt admitted with above diagnosis. Pt received after OT, c/o pain at catheter site. Pt generally frustrated at not being able to go home and pain and with decreased verbalization, made it difficult to accurately assess cognition. However, after prolonged ambulation pt clearly fatigued and very pale and with even less verbalization. After 350' ambulation pt with HR in 80's, monitor showed Afib, pt reported fatigue. SPO2 95%. Pt continued to feel very fatigued after return to bed, went out of Afib after 5 mins supine. Recommend HHPT upon d/c home.  Pt currently with functional limitations due to the deficits listed below (see PT Problem List). Pt will benefit from acute skilled PT to increase their independence and safety with mobility to allow discharge.           If plan is discharge home, recommend the following: Assistance with cooking/housework;Assist for transportation;Help with stairs or ramp for entrance;A little help with walking and/or transfers;A little help with bathing/dressing/bathroom   Can travel by private vehicle   Yes    Equipment Recommendations None recommended by PT  Recommendations for Other Services       Functional Status Assessment Patient has had a recent decline in their functional status and demonstrates the ability to make significant improvements in function in a reasonable and predictable amount of time.     Precautions / Restrictions Precautions Precautions: Fall Recall of Precautions/Restrictions: Intact Restrictions Weight Bearing Restrictions Per Provider  Order: No      Mobility  Bed Mobility Overal bed mobility: Needs Assistance Bed Mobility: Sit to Supine       Sit to supine: Min assist   General bed mobility comments: pt able to lift LE's into bed but min A needed for positioning in bed    Transfers Overall transfer level: Needs assistance Equipment used: Rolling walker (2 wheels) Transfers: Sit to/from Stand Sit to Stand: Contact guard assist           General transfer comment: increased time needed, CGA for safety    Ambulation/Gait Ambulation/Gait assistance: Min assist Gait Distance (Feet): 350 Feet (100', 250') Assistive device: Rolling walker (2 wheels) Gait Pattern/deviations: Staggering right, Step-through pattern Gait velocity: decreased Gait velocity interpretation: >2.62 ft/sec, indicative of community ambulatory   General Gait Details: pt feeling good after forst 100' ambulation. Agreeable to ambulating again after seated rest. Second ambulation pt's energy deficits much more apparent. One stagger to R when turning L with min A to correct. Pt visibly fatigued with RR up to 41 and heart monitor noted pt in Afib, HR in 80's. Pt very pale as well.  Stairs            Wheelchair Mobility     Tilt Bed    Modified Rankin (Stroke Patients Only)       Balance Overall balance assessment: Needs assistance Sitting-balance support: Feet supported, No upper extremity supported Sitting balance-Leahy Scale: Good     Standing balance support: During functional activity, Bilateral upper extremity supported Standing balance-Leahy Scale: Fair Standing balance comment: pt relying on UE support with dynamic activity  Pertinent Vitals/Pain Pain Assessment Pain Assessment: Faces Faces Pain Scale: Hurts little more Pain Location: catheter insertion Pain Descriptors / Indicators: Burning Pain Intervention(s): Limited activity within patient's tolerance, Monitored  during session    Home Living Family/patient expects to be discharged to:: Private residence Living Arrangements: Spouse/significant other Available Help at Discharge: Family;Available 24 hours/day Type of Home: House Home Access: Stairs to enter Entrance Stairs-Rails: None Entrance Stairs-Number of Steps: 4   Home Layout: One level Home Equipment: Cane - single Librarian, academic (2 wheels);Shower seat Additional Comments: Pt lives with wife, daughter can help as needed as well    Prior Function Prior Level of Function : Independent/Modified Independent             Mobility Comments: Cane or no AD PTA, RW on CIR ADLs Comments: independent before admission     Extremity/Trunk Assessment   Upper Extremity Assessment Upper Extremity Assessment: Defer to OT evaluation    Lower Extremity Assessment Lower Extremity Assessment: Generalized weakness    Cervical / Trunk Assessment Cervical / Trunk Assessment: Kyphotic (pt with forward head and rounded shoulders.)  Communication   Communication Communication: No apparent difficulties    Cognition Arousal: Alert Behavior During Therapy: Flat affect   PT - Cognitive impairments: Difficult to assess Difficult to assess due to:  (other)                     PT - Cognition Comments: pt frustrated at not getting to go home, also with pain from catheter, minimal verbalization and difficult to fully assess cognition. Do not think this is his normal Following commands: Intact       Cueing Cueing Techniques: Verbal cues     General Comments General comments (skin integrity, edema, etc.): SPO2 95% on RA. HR in 60's after ambulation and went out of Afib after 5 mins supine rest. BP 153/70.    Exercises     Assessment/Plan    PT Assessment Patient needs continued PT services  PT Problem List Decreased activity tolerance;Cardiopulmonary status limiting activity;Decreased mobility;Decreased balance;Decreased  strength;Pain       PT Treatment Interventions DME instruction;Gait training;Functional mobility training;Therapeutic activities;Patient/family education;Therapeutic exercise;Balance training    PT Goals (Current goals can be found in the Care Plan section)  Acute Rehab PT Goals Patient Stated Goal: to go home-per family PT Goal Formulation: With patient/family Time For Goal Achievement: 11/25/23 Potential to Achieve Goals: Fair    Frequency Min 1X/week     Co-evaluation               AM-PAC PT "6 Clicks" Mobility  Outcome Measure Help needed turning from your back to your side while in a flat bed without using bedrails?: A Little Help needed moving from lying on your back to sitting on the side of a flat bed without using bedrails?: A Little Help needed moving to and from a bed to a chair (including a wheelchair)?: A Little Help needed standing up from a chair using your arms (e.g., wheelchair or bedside chair)?: A Little Help needed to walk in hospital room?: A Little Help needed climbing 3-5 steps with a railing? : A Lot 6 Click Score: 17    End of Session Equipment Utilized During Treatment: Gait belt Activity Tolerance: Patient limited by fatigue Patient left: with call bell/phone within reach;with family/visitor present;in bed Nurse Communication: Mobility status PT Visit Diagnosis: Difficulty in walking, not elsewhere classified (R26.2);Pain Pain - part of body:  (catheter site)  Time: 9147-8295 PT Time Calculation (min) (ACUTE ONLY): 26 min   Charges:   PT Evaluation $PT Eval Moderate Complexity: 1 Mod PT Treatments $Gait Training: 8-22 mins PT General Charges $$ ACUTE PT VISIT: 1 Visit         Lyanne Co, PT  Acute Rehab Services Secure chat preferred Office [phone]   Lawana Chambers Vian Fluegel 11/11/2023, 12:36 PM

## 2023-11-11 NOTE — Plan of Care (Signed)

## 2023-11-11 NOTE — Progress Notes (Signed)
PROGRESS NOTE                                                                                                                                                                                                             Patient Demographics:    Joshua [name], is a 73 y.o. male, DOB - [birth year], ZOX:[id number]  Outpatient Primary MD for the patient is [name], MD    LOS - 1  Admit date - 11/10/2023    No chief complaint on file.      Brief Narrative (HPI from H&P)    73 y.o. male with medical history significant of hypertension, hyperlipidemia, CAD, diabetes, anemia, CKD 3, atrial fibrillation, pancytopenia, sleep disturbance, delirium, obesity, status post laminectomy, GI bleed presenting with AKI from inpatient rehab.   Patient was admitted 12/27-1/26 with encephalopathy and GI bleed in the setting of increased INR.  Found to have AVMs treated with APC.  GI bleeding resolved, hemoglobin stabilized, patient was resumed on Pradaxa.  Also noted to have loculated effusion and had chest tube placed x 2 in consultation with pulmonology and CT surgery.  Received lytics x 2 and chest tube was taken out on 1/17.  Also treated for AKI that admission.   Ultimately discharged to inpatient rehab due to decline from prolonged admission and ongoing issues with delirium.  Regimen there was eventually found that helped improve patient's delirium/cognition and sleep and he remains on this.   Patient was achieving his rehab goals and was approaching discharge in the last few days but has subsequently developed AKI.  Creatinine trend in the last 4 days has been 1.5, 2.0, 3.1, 3.5.  Patient has also reported some right lower quadrant abdominal pain.  Was admitted to the medical hospital for further care.   Subjective:    Joshua [name] today has, No headache, No chest pain, +ve suprapubic fullness and discomfort abdominal pain - No Nausea,  No new weakness tingling or numbness, no SOB   Assessment  & Plan :   AKI on CKD 3.  Baseline creatinine around 1.5. On exam today he has suprapubic fullness, apparently has been having suprapubic fullness and discomfort for several days, Foley catheter placed with over 1500 cc of urine returned, he was also on ARB which was stopped yesterday, will continue Flomax, obtain UA and renal ultrasound,  outpatient urology follow-up.  Nephrology on board.   Hypertension - Continue home Coreg, hydralazine, Imdur add as needed hydralazine - Holding ARB as above   Hyperlipidemia - Continue home rosuvastatin   CAD - Continue home Coreg, Imdur, rosuvastatin - Holding ARB   Diabetes - SSI   Anemia of chronic disease > Hemoglobin stable 10.0 - Trend CBC   Atrial fibrillation - Continue home Coreg -Resume Pradaxa as CBC stable on 11/11/2023   Pancytopenia > Follows outpatient.  Suspected myelodysplastic syndrome. Monitor.   Sleep disturbance Delirium - Continue melatonin, trazodone   Obesity - Noted   Recent prolonged hospitalization > Status post CIR stay, has reached goals. - Continue with PT as needed.        Condition - Fair  Family Communication  :  None  Code Status :  DNR  Consults  :  Renal  PUD Prophylaxis :    Procedures  :     Renal US -  CT - : 1. Mild bilateral hydronephrosis. No stone on either side. 2. Bilateral perinephric stranding, nonspecific. Correlation with urinalysis recommended. 3. Enlarged prostate gland. 4. Trace right pleural effusion.      Disposition Plan  :    Status is: Inpatient   DVT Prophylaxis  :    heparin injection 5,000 Units Start: 11/10/23 2200   Lab Results  Component Value Date   PLT 123 (L) 11/11/2023    Diet :  Diet Order             Diet heart healthy/carb modified Room service appropriate? Yes; Fluid consistency: Thin  Diet effective now                    Inpatient Medications  Scheduled  Meds:  acetaminophen  650 mg Oral TID WC & HS   calcium carbonate  500 mg of elemental calcium Oral BID WC   carvedilol  6.25 mg Oral BID WC   feeding supplement  1 Container Oral BID BM   folic acid  1 mg Oral Daily   heparin  5,000 Units Subcutaneous Q8H   insulin aspart  0-9 Units Subcutaneous TID WC   isosorbide mononitrate  30 mg Oral Daily   melatonin  5 mg Oral QHS   rosuvastatin  20 mg Oral Daily   simethicone  80 mg Oral QID   sodium chloride flush  3 mL Intravenous Q12H   tamsulosin  0.4 mg Oral Daily   traZODone  50 mg Oral QHS   Continuous Infusions: PRN Meds:.alum & mag hydroxide-simeth, bisacodyl, guaiFENesin-dextromethorphan, hydrALAZINE, hydrocortisone cream, lidocaine, ondansetron, mouth rinse, polyethylene glycol, sodium chloride, traMADol  Antibiotics  :    Anti-infectives (From admission, onward)    None         Objective:   Vitals:   11/11/23 0000 11/11/23 0400 11/11/23 0401 11/11/23 0902  BP: (!) 154/87 (!) 156/76  (!) 175/75  Pulse: (!) 58 69 63 73  Resp: 18 (!) 22 19   Temp: 98.2 F (36.8 C) 98.1 F (36.7 C)    TempSrc: Oral Oral    SpO2: 94% (!) 87% 90%     Wt Readings from Last 3 Encounters:  10/31/23 102.7 kg  10/20/23 102.6 kg  04/27/23 115.2 kg     Intake/Output Summary (Last 24 hours) at 11/11/2023 1133 Last data filed at 11/11/2023 0900 Gross per 24 hour  Intake --  Output 1200 ml  Net -1200 ml     Physical Exam  Awake Alert, No new F.N deficits, Normal affect [place].AT,PERRAL Supple Neck, No JVD,   Symmetrical Chest wall movement, Good air movement bilaterally, CTAB RRR,No Gallops,Rubs or new Murmurs,  +ve B.Sounds, suprapubic fullness and tenderness on palpation,    No Cyanosis, Clubbing or edema       Data Review:    Recent Labs  Lab 11/07/23 0040 11/08/23 0535 11/10/23 1650 11/11/23 0503  WBC 3.4* 3.4* 2.8* 4.1  HGB 8.2* 10.0* 9.2* 10.3*  HCT 26.4* 31.8* 29.6* 32.6*  PLT 115* 126* 134* 123*  MCV 98.5 98.5  99.7 98.8  MCH 30.6 31.0 31.0 31.2  MCHC 31.1 31.4 31.1 31.6  RDW 20.0* 19.7* 19.6* 19.2*    Recent Labs  Lab 11/07/23 0040 11/08/23 0535 11/08/23 1931 11/09/23 0507 11/09/23 1037 11/10/23 0523 11/11/23 0906  NA 136 139 136 138  --  139 138  K 4.3 4.5 4.7 3.9  --  4.3 5.0  CL 105 102 103 102  --  105 107  CO2 22 17* 20* 19*  --  17* 15*  ANIONGAP 9 20* 13 17*  --  17* 16*  GLUCOSE 104* 108* 96 92  --  81 74  BUN 32* 34* 35* 35*  --  38* 41*  CREATININE 1.51* 2.01* 2.81* 3.15*  --  3.51* 3.99*  AMMONIA  --   --   --   --  38*  --  25  BNP 268.1* 379.1*  --   --   --   --  422.3*  PHOS  --   --   --   --   --   --  6.0*  CALCIUM 8.6* 9.8 9.0 9.2  --  8.9 8.3*      Recent Labs  Lab 11/07/23 0040 11/08/23 0535 11/08/23 1931 11/09/23 0507 11/09/23 1037 11/10/23 0523 11/11/23 0906  AMMONIA  --   --   --   --  38*  --  25  BNP 268.1* 379.1*  --   --   --   --  422.3*  CALCIUM 8.6* 9.8 9.0 9.2  --  8.9 8.3*    --------------------------------------------------------------------------------------------------------------- Lab Results  Component Value Date   CHOL 119 04/29/2010   HDL 24.30 (L) 04/29/2010   LDLCALC 75 04/29/2010   TRIG 97.0 04/29/2010   CHOLHDL 5 04/29/2010    Lab Results  Component Value Date   HGBA1C 5.5 09/24/2023     Micro Results Recent Results (from the past 240 hours)  Urine Culture     Status: Abnormal   Collection Time: 11/02/23  3:10 PM   Specimen: Urine, Clean Catch  Result Value Ref Range Status   Specimen Description URINE, CLEAN CATCH  Final   Special Requests NONE Reflexed from [id number]  Final   Culture (A)  Final    <10,000 COLONIES/mL INSIGNIFICANT GROWTH Performed at [facility], [address] [place], [place] [address]    Report Status 11/03/2023 FINAL  Final    Radiology Report CT ABDOMEN PELVIS WO CONTRAST Result Date: 11/10/2023 CLINICAL DATA:  Right lower quadrant abdominal pain. EXAM: CT ABDOMEN AND PELVIS  WITHOUT CONTRAST TECHNIQUE: Multidetector CT imaging of the abdomen and pelvis was performed following the standard protocol without IV contrast. RADIATION DOSE REDUCTION: This exam was performed according to the departmental dose-optimization program which includes automated exposure control, adjustment of the mA and/or kV according to patient size and/or use of iterative reconstruction technique. COMPARISON:  CT abdomen pelvis dated 04/27/2014. FINDINGS:  Evaluation of this exam is limited in the absence of intravenous contrast. Lower chest: Trace right pleural effusion. There is associated minimal compressive atelectasis of the right lung base. Three vessel coronary vascular calcification. No intra-abdominal free air.  Trace free fluid in the lower abdomen. Hepatobiliary: Small cyst in the anterior liver. No biliary dilatation. No calcified gallstone. Probable sludge or noncalcified stone in the gallbladder. No pericholecystic fluid or evidence of acute cholecystitis by CT. Pancreas: Unremarkable. No pancreatic ductal dilatation or surrounding inflammatory changes. Spleen: Normal in size without focal abnormality. Adrenals/Urinary Tract: The adrenal glands are unremarkable. Mild bilateral hydronephrosis. There is no stone on either side. Left renal inferior pole cyst measures 12 cm. Additional smaller cyst noted in the inferior pole of the left kidney. Indeterminate 9 mm higher attenuating lesion from the upper pole of the left kidney may represent a complex/proteinaceous cyst. The visualized ureters appear unremarkable. The urinary bladder is distended and grossly unremarkable. Stomach/Bowel: There is no bowel obstruction or active inflammation. The appendix is normal. Vascular/Lymphatic: Moderate aortoiliac atherosclerotic disease. The IVC is unremarkable. No portal venous gas. There is no adenopathy. Reproductive: Enlarged prostate gland measuring 6.5 cm in transverse axial diameter. The seminal vesicles are  symmetric. Other: There is bilateral perinephric stranding extending down along the paracolic gutters and adjacent to the urinary bladder. This is nonspecific and may be fluid related to renal cyst or represent underlying inflammatory/infectious process. Correlation with urinalysis recommended. Musculoskeletal: Osteopenia.  No acute osseous pathology. IMPRESSION: 1. Mild bilateral hydronephrosis. No stone on either side. 2. Bilateral perinephric stranding, nonspecific. Correlation with urinalysis recommended. 3. Enlarged prostate gland. 4. Trace right pleural effusion. Electronically Signed   By: Elgie Collard M.D.   On: 11/10/2023 12:05   US Abdomen Complete Result Date: 11/09/2023 CLINICAL DATA:  Abdominal pain EXAM: ABDOMEN ULTRASOUND COMPLETE COMPARISON:  Right upper quadrant ultrasound 10/06/2023 and older. Renal ultrasound 11/02/2023. FINDINGS: Gallbladder: Gallbladder is mildly distended but there is significant gallstones. No wall thickening or adjacent fluid. Common bile duct: Diameter: 5 mm Liver: No focal lesion identified. Within normal limits in parenchymal echogenicity. Portal vein is patent on color Doppler imaging with normal direction of blood flow towards the liver. IVC: No abnormality visualized. Pancreas: Poorly seen with overlapping bowel gas and soft tissue. Spleen: Enlarged at 13.6 cm Right Kidney: Length: 12.1 cm. No collecting system dilatation. There is a small anechoic structure in the right kidney measuring 13 mm consistent with a benign cyst. Left Kidney: Length: 12.1 cm in length. No collecting system dilatation with the left kidney is poorly seen with overlapping bowel gas and soft tissue. Dominant cyst identified once again measuring up to 11.4 cm. Abdominal aorta: Overall poorly seen. Other findings: Extremely limited examination for several structures with the overlapping bowel gas and soft tissue. If needed additional cross-sectional imaging study such as CT could be  considered as clinically appropriate pleural effusion. IMPRESSION: Gallstones again seen.  No ductal dilatation. Splenomegaly. Renal cysts. Several structures are poorly seen with overlapping bowel gas and soft tissue. If there is further concern additional cross-sectional imaging study such as CT may be useful in this situation for further delineation. Electronically Signed   By: Karen Kays M.D.   On: 11/09/2023 17:32     Signature  -   Susa Raring M.D on 11/11/2023 at 11:33 AM   -  To page go to [email or web address]

## 2023-11-11 NOTE — Plan of Care (Signed)

## 2023-11-11 NOTE — Progress Notes (Signed)
S: Very ready to go home today.  Does not have any concerns.  States he was told that he was going to be able to go home today. O:BP (!) 156/76 (BP Location: Right Arm)   Pulse 63   Temp 98.1 F (36.7 C) (Oral)   Resp 19   SpO2 90%   Intake/Output Summary (Last 24 hours) at 11/11/2023 0804 Last data filed at 11/11/2023 0600 Gross per 24 hour  Intake --  Output 200 ml  Net -200 ml   Intake/Output: I/O last 3 completed shifts: In: -  Out: 200 [Urine:200]  Intake/Output this shift:  No intake/output data recorded. Weight change:  Gen: Sitting up in bed, no acute distress, alert CVS: Regular rate and rhythm without murmurs rubs or gallops Resp: Clear to auscultation bilaterally Abd: Mildly tender more so in the left lower quadrant today, no longer diffusely as prior Ext: Moves all extremities grossly equally  Recent Labs  Lab 11/07/23 0040 11/08/23 0535 11/08/23 1931 11/09/23 0507 11/10/23 0523  NA 136 139 136 138 139  K 4.3 4.5 4.7 3.9 4.3  CL 105 102 103 102 105  CO2 22 17* 20* 19* 17*  GLUCOSE 104* 108* 96 92 81  BUN 32* 34* 35* 35* 38*  CREATININE 1.51* 2.01* 2.81* 3.15* 3.51*  CALCIUM 8.6* 9.8 9.0 9.2 8.9   Liver Function Tests: No results for input(s): "AST", "ALT", "ALKPHOS", "BILITOT", "PROT", "ALBUMIN" in the last 168 hours. No results for input(s): "LIPASE", "AMYLASE" in the last 168 hours. Recent Labs  Lab 11/09/23 1037  AMMONIA 38*   CBC: Recent Labs  Lab 11/07/23 0040 11/08/23 0535 11/10/23 1650 11/11/23 0503  WBC 3.4* 3.4* 2.8* 4.1  HGB 8.2* 10.0* 9.2* 10.3*  HCT 26.4* 31.8* 29.6* 32.6*  MCV 98.5 98.5 99.7 98.8  PLT 115* 126* 134* 123*   Cardiac Enzymes: No results for input(s): "CKTOTAL", "CKMB", "CKMBINDEX", "TROPONINI" in the last 168 hours. CBG: Recent Labs  Lab 11/10/23 2209  GLUCAP 71    Iron Studies: No results for input(s): "IRON", "TIBC", "TRANSFERRIN", "FERRITIN" in the last 72 hours. Studies/Results: CT ABDOMEN PELVIS WO  CONTRAST Result Date: 11/10/2023 CLINICAL DATA:  Right lower quadrant abdominal pain. EXAM: CT ABDOMEN AND PELVIS WITHOUT CONTRAST TECHNIQUE: Multidetector CT imaging of the abdomen and pelvis was performed following the standard protocol without IV contrast. RADIATION DOSE REDUCTION: This exam was performed according to the departmental dose-optimization program which includes automated exposure control, adjustment of the mA and/or kV according to patient size and/or use of iterative reconstruction technique. COMPARISON:  CT abdomen pelvis dated 04/27/2014. FINDINGS: Evaluation of this exam is limited in the absence of intravenous contrast. Lower chest: Trace right pleural effusion. There is associated minimal compressive atelectasis of the right lung base. Three vessel coronary vascular calcification. No intra-abdominal free air.  Trace free fluid in the lower abdomen. Hepatobiliary: Small cyst in the anterior liver. No biliary dilatation. No calcified gallstone. Probable sludge or noncalcified stone in the gallbladder. No pericholecystic fluid or evidence of acute cholecystitis by CT. Pancreas: Unremarkable. No pancreatic ductal dilatation or surrounding inflammatory changes. Spleen: Normal in size without focal abnormality. Adrenals/Urinary Tract: The adrenal glands are unremarkable. Mild bilateral hydronephrosis. There is no stone on either side. Left renal inferior pole cyst measures 12 cm. Additional smaller cyst noted in the inferior pole of the left kidney. Indeterminate 9 mm higher attenuating lesion from the upper pole of the left kidney may represent a complex/proteinaceous cyst. The visualized ureters appear unremarkable.  The urinary bladder is distended and grossly unremarkable. Stomach/Bowel: There is no bowel obstruction or active inflammation. The appendix is normal. Vascular/Lymphatic: Moderate aortoiliac atherosclerotic disease. The IVC is unremarkable. No portal venous gas. There is no  adenopathy. Reproductive: Enlarged prostate gland measuring 6.5 cm in transverse axial diameter. The seminal vesicles are symmetric. Other: There is bilateral perinephric stranding extending down along the paracolic gutters and adjacent to the urinary bladder. This is nonspecific and may be fluid related to renal cyst or represent underlying inflammatory/infectious process. Correlation with urinalysis recommended. Musculoskeletal: Osteopenia.  No acute osseous pathology. IMPRESSION: 1. Mild bilateral hydronephrosis. No stone on either side. 2. Bilateral perinephric stranding, nonspecific. Correlation with urinalysis recommended. 3. Enlarged prostate gland. 4. Trace right pleural effusion. Electronically Signed   By: Elgie Collard M.D.   On: 11/10/2023 12:05   US Abdomen Complete Result Date: 11/09/2023 CLINICAL DATA:  Abdominal pain EXAM: ABDOMEN ULTRASOUND COMPLETE COMPARISON:  Right upper quadrant ultrasound 10/06/2023 and older. Renal ultrasound 11/02/2023. FINDINGS: Gallbladder: Gallbladder is mildly distended but there is significant gallstones. No wall thickening or adjacent fluid. Common bile duct: Diameter: 5 mm Liver: No focal lesion identified. Within normal limits in parenchymal echogenicity. Portal vein is patent on color Doppler imaging with normal direction of blood flow towards the liver. IVC: No abnormality visualized. Pancreas: Poorly seen with overlapping bowel gas and soft tissue. Spleen: Enlarged at 13.6 cm Right Kidney: Length: 12.1 cm. No collecting system dilatation. There is a small anechoic structure in the right kidney measuring 13 mm consistent with a benign cyst. Left Kidney: Length: 12.1 cm in length. No collecting system dilatation with the left kidney is poorly seen with overlapping bowel gas and soft tissue. Dominant cyst identified once again measuring up to 11.4 cm. Abdominal aorta: Overall poorly seen. Other findings: Extremely limited examination for several structures with  the overlapping bowel gas and soft tissue. If needed additional cross-sectional imaging study such as CT could be considered as clinically appropriate pleural effusion. IMPRESSION: Gallstones again seen.  No ductal dilatation. Splenomegaly. Renal cysts. Several structures are poorly seen with overlapping bowel gas and soft tissue. If there is further concern additional cross-sectional imaging study such as CT may be useful in this situation for further delineation. Electronically Signed   By: Karen Kays M.D.   On: 11/09/2023 17:32    acetaminophen  650 mg Oral TID WC & HS   calcium carbonate  500 mg of elemental calcium Oral BID WC   carvedilol  6.25 mg Oral BID WC   feeding supplement  1 Container Oral BID BM   folic acid  1 mg Oral Daily   heparin  5,000 Units Subcutaneous Q8H   insulin aspart  0-9 Units Subcutaneous TID WC   isosorbide mononitrate  30 mg Oral Daily   melatonin  5 mg Oral QHS   rosuvastatin  20 mg Oral Daily   simethicone  80 mg Oral QID   sodium chloride flush  3 mL Intravenous Q12H   tamsulosin  0.4 mg Oral Daily   traZODone  50 mg Oral QHS    BMET    Component Value Date/Time   NA 139 11/10/2023 0523   NA 139 05/11/2023 1007   K 4.3 11/10/2023 0523   CL 105 11/10/2023 0523   CO2 17 (L) 11/10/2023 0523   GLUCOSE 81 11/10/2023 0523   BUN 38 (H) 11/10/2023 0523   BUN 24 05/11/2023 1007   CREATININE 3.51 (H) 11/10/2023 0523   CREATININE  1.34 (H) 03/18/2023 1026   CALCIUM 8.9 11/10/2023 0523   GFRNONAA 18 (L) 11/10/2023 0523   GFRNONAA 56 (L) 03/18/2023 1026   GFRAA >60 04/26/2018 0901   CBC    Component Value Date/Time   WBC 4.1 11/11/2023 0503   RBC 3.30 (L) 11/11/2023 0503   HGB 10.3 (L) 11/11/2023 0503   HGB 13.1 03/18/2023 1026   HCT 32.6 (L) 11/11/2023 0503   PLT 123 (L) 11/11/2023 0503   PLT 134 (L) 03/18/2023 1026   MCV 98.8 11/11/2023 0503   MCH 31.2 11/11/2023 0503   MCHC 31.6 11/11/2023 0503   RDW 19.2 (H) 11/11/2023 0503   LYMPHSABS 0.9  10/31/2023 0614   MONOABS 0.3 10/31/2023 0614   EOSABS 0.1 10/31/2023 0614   BASOSABS 0.0 10/31/2023 0614     Assessment/Plan:  AKI with hematuria on CKD3b: Baseline appears around 1.3-1.4. Cr steadily increasing up to 3.15>3.51 yesterday though with decreased rate of rise. Pending Cr this AM. Findings could be suggestive of ATN, which could have occurred from his decreased PO intake. UA updated with mod blood, >300 protein, large leukocytes, many bacteria, and WBC clumps. Once BMP returns, can calculate FENa with urine Na and creatinine levels. Continue to hold ARB. CTAP yesterday with mild bilateral hydronephrosis (new from recent US), bilateral perinephric stranding, enlarged prostate. These findings increasing possibility of now obstructive renal disease, and he could have a superimposed infection from multiple ISCs based on UA. Will need to place foley today, initiate flomax x3 days before voiding trial, and start cefadroxil for UTI and change management based on culture results. If Cr decreasing again this AM, may could discharge home with close outpatient f/u. Hematuria: Updated [place] with continued hematuria. Renal US with mass in the bladder which could be contributing. Continue to recommend cystoscopy at some point. Unlikely renal stone given diffuse abdominal pain (vs focal/flank pain) and nonobstructive nature as seen on abd [place]. Hypokalemia, hypomagnesemia, hypocalcemia: Previously resolved. Monitor as clinically needed. Abdominal pain: Abd [place] with splenomegaly, gallstones with no ductal dilation, renal cysts, and several structures poorly seen with overlapping bowel gas and soft tissue. CTAP as per problem #1. Consider UTI and renal inflammation as contributing to pain and treat as above. Functional deficits 2/2 debility from GI bleed/delirium: Discharged from PM&R service yesterday after acute inpatient rehab. Continue to monitor clinical status. PAF/SVT/HTN: Per primary team. Continuing  home medications other than ARB. Can consider adding on amlodipine for BP control, though acceptable BP at this time.  Janeal Holmes, MD Family Medicine Resident, PGY-2 8:04 AM 11/11/2023  [facility] [phone]

## 2023-11-11 NOTE — TOC Initial Note (Signed)
Transition of Care [facility]) - Initial/Assessment Note    Patient Details  Name: Joshua [name] MRN: [id number] Date of Birth: October 04, 1950  Transition [facility]) CM/SW Contact:    Mearl Latin, LCSW Phone Number: 11/11/2023, 8:50 AM  Clinical Narrative:                 Patient admitted from CIR where he was approaching ability to discharge home with spouse and home health ([facility]). TOC will continue to follow for needs.   Expected Discharge Plan: Home w Home Health Services Barriers to Discharge: Continued Medical Work up   Patient Goals and CMS Choice            Expected Discharge Plan and Services   Discharge Planning Services: CM Consult   Living arrangements for the past 2 months: Single Family Home                                      Prior Living Arrangements/Services Living arrangements for the past 2 months: Single Family Home Lives with:: Spouse Patient language and need for interpreter reviewed:: Yes Do you feel safe going back to the place where you live?: Yes      Need for Family Participation in Patient Care: Yes (Comment) Care giver support system in place?: Yes (comment) Current home services: DME Criminal Activity/Legal Involvement Pertinent to Current Situation/Hospitalization: No - Comment as needed  Activities of Daily Living      Permission Sought/Granted Permission sought to share information with : Facility Medical sales representative, Family Supports Permission granted to share information with : Yes, Verbal Permission Granted  Share Information with NAME: [name]  Permission granted to share info w AGENCY: hh  Permission granted to share info w Relationship: Spouse  Permission granted to share info w Contact Information: [phone]  Emotional Assessment Appearance:: Appears stated age     Orientation: : Oriented to Self, Oriented to Place, Oriented to  Time, Oriented to Situation Alcohol / Substance Use: Not Applicable Psych  Involvement: No (comment)  Admission diagnosis:  Acute kidney injury (HCC) [N17.9] Acute renal failure superimposed on stage 3a chronic kidney disease, unspecified acute renal failure type (HCC) [N17.9, N18.31] Patient Active Problem List   Diagnosis Date Noted   Neutropenia (HCC) 11/10/2023   Gastric AVM 11/10/2023   Increased ammonia level 11/10/2023   Acute renal failure superimposed on stage 3a chronic kidney disease, unspecified acute renal failure type (HCC) 11/10/2023   Cognitive and neurobehavioral dysfunction 11/03/2023   Debility 10/24/2023   Acute respiratory failure with hypoxia (HCC) 10/20/2023   Gross hematuria 10/20/2023   Morbid obesity (HCC) 10/20/2023   Coagulopathy (HCC) 10/20/2023   Hypokalemia 10/20/2023   Medication management 10/16/2023   Odynophagia 10/15/2023   Need for emotional support 10/13/2023   Palliative care encounter 10/12/2023   Goals of care, counseling/discussion 10/12/2023   Counseling and coordination of care 10/12/2023   Pleural effusion 10/10/2023   Hemothorax on right 10/01/2023   Hemothorax 09/30/2023   Pneumothorax 09/30/2023   Anemia of chronic disease 09/24/2023   Gastrointestinal hemorrhage with melena 09/24/2023   Hyperglycemia 09/24/2023   Weight loss 09/24/2023   Thrombocytopenia (HCC) 09/24/2023   Hyponatremia 09/24/2023   Acute on chronic renal failure (HCC) 09/24/2023   CKD stage 3a, GFR 45-59 ml/min (HCC) 09/24/2023   S/P lumbar laminectomy 04/29/2018   Hearing problem    Chest pain, unspecified 06/23/2016  Abnormal nuclear stress test 09/27/2014   Crescendo angina (HCC) 09/26/2014   Pulmonary hypertension (HCC) 09/26/2014   Internal hemorrhoids 01/19/2014   Diverticulosis of colon without hemorrhage 01/19/2014   Paroxysmal atrial fibrillation (HCC) 04/29/2010   PALPITATIONS 04/21/2010   External hemorrhoids 11/01/2009   HYPERKALEMIA 09/09/2009   Type 2 diabetes mellitus (HCC) 08/16/2009   Hyperlipidemia 08/16/2009    Essential hypertension 08/16/2009   Coronary artery disease with chronic angina 08/16/2009   Disorder resulting from impaired renal function 08/16/2009   PCP:  [name], MD Pharmacy:   [phone] - [address], [place] - [address] AT [facility] [address] [place] [address] Phone: [phone] Fax: [phone]     Social Drivers of Health (SDOH) Social History: SDOH Screenings   Food Insecurity: No Food Insecurity (09/24/2023)  Housing: Low Risk  (09/25/2023)  Transportation Needs: No Transportation Needs (09/24/2023)  Utilities: Not At Risk (09/24/2023)  Social Connections: Patient Unable To Answer (09/27/2023)  Tobacco Use: Medium Risk (10/24/2023)   SDOH Interventions:     Readmission Risk Interventions    09/27/2023   10:04 AM  Readmission Risk Prevention Plan  Transportation Screening Complete  PCP or Specialist Appt within 5-7 Days Complete  Home Care Screening Complete  Medication Review (RN CM) Complete

## 2023-11-11 NOTE — Evaluation (Signed)
Occupational Therapy Evaluation Patient Details Name: Joshua [name] MRN: [id number] DOB: [birth year] Today's Date: 11/11/2023   History of Present Illness   Pt is 73 yo male presenting from CIR on 11/10/23 with AKI. PMH:  hypertension, hyperlipidemia, CAD, diabetes, anemia, CKD 3, atrial fibrillation, pancytopenia, sleep disturbance, delirium, obesity, status post laminectomy, GI bleed     Clinical Impressions Pt c/o pain at catheter site, wife/daughter present during session. Pt/family worried about OOB activity with recent medical problems, spoke with Pt about importance about OOB activities, MD also came in and stated OOB activities were important to maintain strength and ensure Pt is ready to return home once medically stable. Pt agreeable to session. Prior to initial admission, Pt independent, lives with wife, 4 STE. Pt currently doing well, set up/CGA for OOB activities with RW, min A for LB ADLs. Pt able to ambualte down hall with RW at [facility], met PT in hallway and PT took over session. Spoke with family about follow up needs, they said no need for [facility] services, but per notes looks like HH services have been ordered, Pt would benefit from [facility] upon DC to maximize safety in home setting, will follow acutely to progress as able. Family states they have all DME needed to remain safe/independent.      If plan is discharge home, recommend the following:   A little help with walking and/or transfers;A little help with bathing/dressing/bathroom;Assistance with cooking/housework;Assist for transportation;Help with stairs or ramp for entrance     Functional Status Assessment   Patient has had a recent decline in their functional status and demonstrates the ability to make significant improvements in function in a reasonable and predictable amount of time.     Equipment Recommendations   None recommended by OT     Recommendations for Other Services          Precautions/Restrictions   Precautions Precautions: Fall Recall of Precautions/Restrictions: Intact Precaution/Restrictions Comments: continous spitting Restrictions Weight Bearing Restrictions Per Provider Order: No     Mobility Bed Mobility Overal bed mobility: Needs Assistance Bed Mobility: Supine to Sit     Supine to sit: Mod assist, HOB elevated, Used rails     General bed mobility comments: mod A to power sitting up and scooting to EOB    Transfers Overall transfer level: Needs assistance Equipment used: Rolling walker (2 wheels) Transfers: Sit to/from Stand, Bed to chair/wheelchair/BSC Sit to Stand: Contact guard assist     Step pivot transfers: Contact guard assist     General transfer comment: CGA, increased effort for STS      Balance Overall balance assessment: Needs assistance Sitting-balance support: Feet supported, No upper extremity supported Sitting balance-Leahy Scale: Fair     Standing balance support: Single extremity supported, During functional activity Standing balance-Leahy Scale: Fair Standing balance comment: able to stand with RW one hand supported open doors, stands at sink as needed leaning for support                           ADL either performed or assessed with clinical judgement   ADL Overall ADL's : Needs assistance/impaired Eating/Feeding: Independent   Grooming: Set up;Sitting   Upper Body Bathing: Set up;Sitting   Lower Body Bathing: Minimal assistance;Sit to/from stand   Upper Body Dressing : Set up;Sitting   Lower Body Dressing: Minimal assistance;Sit to/from stand   Toilet Transfer: Contact guard assist;Rolling walker (2 wheels);Comfort height toilet  Functional mobility during ADLs: Contact guard assist;Rolling walker (2 wheels) General ADL Comments: Pt able to complete ADLs sitting EOB, some stiffness making LB ADLs slightly difficult but able to don/doff socks with increased effort.  Effortful to stand with RW, at [facility]     Vision Baseline Vision/History: 1 Wears glasses Ability to See in Adequate Light: 0 Adequate Patient Visual Report: No change from baseline       Perception         Praxis         Pertinent Vitals/Pain Pain Assessment Pain Assessment: No/denies pain     Extremity/Trunk Assessment Upper Extremity Assessment Upper Extremity Assessment: Overall WFL for tasks assessed           Communication Communication Communication: No apparent difficulties   Cognition Arousal: Alert Behavior During Therapy: Flat affect, Impulsive Cognition: No apparent impairments             OT - Cognition Comments: Pt grossly WFLs, impulsive to stand and complete activty due to slight agitation from pain and having to get up and move around, but agreeable to participate.                 Following commands: Intact       Cueing  General Comments   Cueing Techniques: Verbal cues      Exercises     Shoulder Instructions      Home Living Family/patient expects to be discharged to:: Private residence Living Arrangements: Spouse/significant other Available Help at Discharge: Family;Available 24 hours/day Type of Home: House Home Access: Stairs to enter [employer] of Steps: 4 Entrance Stairs-Rails: None Home Layout: One level     Bathroom Shower/Tub: Producer, television/film/video: Handicapped height     Home Equipment: Cane - single Librarian, academic (2 wheels);Shower seat   Additional Comments: Pt lives with wife, daughter can help as needed as well  Lives With: Spouse    Prior Functioning/Environment Prior Level of Function : Independent/Modified Independent             Mobility Comments: walks with SPC at times, usually no AD, denies falls in past 6 months ADLs Comments: independent    OT Problem List: Decreased activity tolerance;Decreased safety awareness;Pain   OT Treatment/Interventions:  Self-care/ADL training;Therapeutic exercise;Energy conservation;DME and/or AE instruction;Therapeutic activities;Patient/family education;Balance training      OT Goals(Current goals can be found in the care plan section)   Acute Rehab OT Goals Patient Stated Goal: to return home OT Goal Formulation: With patient/family Time For Goal Achievement: 11/25/23 Potential to Achieve Goals: Good   OT Frequency:  Min 1X/week    Co-evaluation              AM-PAC OT "6 Clicks" Daily Activity     Outcome Measure Help from another person eating meals?: None Help from another person taking care of personal grooming?: A Little Help from another person toileting, which includes using toliet, bedpan, or urinal?: A Little Help from another person bathing (including washing, rinsing, drying)?: A Little Help from another person to put on and taking off regular upper body clothing?: A Little Help from another person to put on and taking off regular lower body clothing?: A Little 6 Click Score: 19   End of Session Equipment Utilized During Treatment: Gait belt;Rolling walker (2 wheels) Nurse Communication: Mobility status  Activity Tolerance: Patient tolerated treatment well Patient left: Other (comment) (with PT in hallway)  OT Visit Diagnosis: Unsteadiness on feet (R26.81);Muscle  weakness (generalized) (M62.81)                Time: 1610-9604 OT Time Calculation (min): 31 min Charges:  OT General Charges $OT Visit: 1 Visit OT Evaluation $OT Eval Low Complexity: 1 Low OT Treatments $Self Care/Home Management : 8-22 mins  [place], OTR/L   Alexis Goodell 11/11/2023, 11:39 AM

## 2023-11-12 DIAGNOSIS — N1831 Chronic kidney disease, stage 3a: Secondary | ICD-10-CM | POA: Diagnosis not present

## 2023-11-12 DIAGNOSIS — N179 Acute kidney failure, unspecified: Secondary | ICD-10-CM | POA: Diagnosis not present

## 2023-11-12 LAB — CBC WITH DIFFERENTIAL/PLATELET
Abs Immature Granulocytes: 0.02 10*3/uL (ref 0.00–0.07)
Basophils Absolute: 0 10*3/uL (ref 0.0–0.1)
Basophils Relative: 0 %
Eosinophils Absolute: 0.1 10*3/uL (ref 0.0–0.5)
Eosinophils Relative: 1 %
HCT: 30.6 % — ABNORMAL LOW (ref 39.0–52.0)
Hemoglobin: 9.7 g/dL — ABNORMAL LOW (ref 13.0–17.0)
Immature Granulocytes: 0 %
Lymphocytes Relative: 11 %
Lymphs Abs: 0.6 10*3/uL — ABNORMAL LOW (ref 0.7–4.0)
MCH: 30.6 pg (ref 26.0–34.0)
MCHC: 31.7 g/dL (ref 30.0–36.0)
MCV: 96.5 fL (ref 80.0–100.0)
Monocytes Absolute: 0.6 10*3/uL (ref 0.1–1.0)
Monocytes Relative: 12 %
Neutro Abs: 3.9 10*3/uL (ref 1.7–7.7)
Neutrophils Relative %: 76 %
Platelets: 128 10*3/uL — ABNORMAL LOW (ref 150–400)
RBC: 3.17 MIL/uL — ABNORMAL LOW (ref 4.22–5.81)
RDW: 19 % — ABNORMAL HIGH (ref 11.5–15.5)
WBC: 5.1 10*3/uL (ref 4.0–10.5)
nRBC: 0 % (ref 0.0–0.2)

## 2023-11-12 LAB — GLUCOSE, CAPILLARY
Glucose-Capillary: 92 mg/dL (ref 70–99)
Glucose-Capillary: 139 mg/dL — ABNORMAL HIGH (ref 70–99)
Glucose-Capillary: 137 mg/dL — ABNORMAL HIGH (ref 70–99)
Glucose-Capillary: 101 mg/dL — ABNORMAL HIGH (ref 70–99)

## 2023-11-12 LAB — BASIC METABOLIC PANEL
Anion gap: 13 (ref 5–15)
BUN: 33 mg/dL — ABNORMAL HIGH (ref 8–23)
CO2: 19 mmol/L — ABNORMAL LOW (ref 22–32)
Calcium: 8.2 mg/dL — ABNORMAL LOW (ref 8.9–10.3)
Chloride: 103 mmol/L (ref 98–111)
Creatinine, Ser: 2.6 mg/dL — ABNORMAL HIGH (ref 0.61–1.24)
GFR, Estimated: 25 mL/min — ABNORMAL LOW (ref >60)
Glucose, Bld: 97 mg/dL (ref 70–99)
Potassium: 4.2 mmol/L (ref 3.5–5.1)
Sodium: 135 mmol/L (ref 135–145)

## 2023-11-12 LAB — PHOSPHORUS: Phosphorus: 3.6 mg/dL (ref 2.5–4.6)

## 2023-11-12 LAB — BRAIN NATRIURETIC PEPTIDE: B Natriuretic Peptide: 395.9 pg/mL — ABNORMAL HIGH (ref 0.0–100.0)

## 2023-11-12 LAB — MAGNESIUM: Magnesium: 1.5 mg/dL — ABNORMAL LOW (ref 1.7–2.4)

## 2023-11-12 LAB — AMMONIA: Ammonia: 33 umol/L (ref 9–35)

## 2023-11-12 MED ORDER — LACTATED RINGERS IV SOLN: INTRAVENOUS | Status: AC

## 2023-11-12 MED ORDER — MAGNESIUM SULFATE 4 GM/100ML IV SOLN
4.0000 g | Freq: Once | INTRAVENOUS | Status: AC
  Administered 2023-11-12: 4 g via INTRAVENOUS
  Filled 2023-11-12: qty 100

## 2023-11-12 NOTE — Progress Notes (Signed)
S:Remains ready to go home this morning. He has no concerns. His abdominal pain as improved. O:BP (!) 125/52 (BP Location: Right Arm)   Pulse 64   Temp 99 F (37.2 C) (Oral)   Resp (!) 25   SpO2 97%   Intake/Output Summary (Last 24 hours) at 11/12/2023 1610 Last data filed at 11/12/2023 9604 Gross per 24 hour  Intake --  Output 2675 ml  Net -2675 ml   Intake/Output: I/O last 3 completed shifts: In: -  Out: 3100 [Urine:3100]  Intake/Output this shift:  Total I/O In: -  Out: 775 [Urine:775] Weight change:  VWU:JWJXB in bed, NAD JYN:WGNFAOZHYQM irregular, no m/r/g Resp:CTAB anteriorly VHQ:IONGEX firm, non-tender, mildly distended, normoactive bowel sounds BMW:UXLKG all grossly equally  Recent Labs  Lab 11/07/23 0040 11/08/23 0535 11/08/23 1931 11/09/23 0507 11/10/23 0523 11/11/23 0906 11/12/23 0659  NA 136 139 136 138 139 138 135  K 4.3 4.5 4.7 3.9 4.3 5.0 4.2  CL 105 102 103 102 105 107 103  CO2 22 17* 20* 19* 17* 15* 19*  GLUCOSE 104* 108* 96 92 81 74 97  BUN 32* 34* 35* 35* 38* 41* 33*  CREATININE 1.51* 2.01* 2.81* 3.15* 3.51* 3.99* 2.60*  CALCIUM 8.6* 9.8 9.0 9.2 8.9 8.3* 8.2*  PHOS  --   --   --   --   --  6.0* 3.6   Liver Function Tests: No results for input(s): "AST", "ALT", "ALKPHOS", "BILITOT", "PROT", "ALBUMIN" in the last 168 hours. No results for input(s): "LIPASE", "AMYLASE" in the last 168 hours. Recent Labs  Lab 11/09/23 1037 11/11/23 0906 11/12/23 0659  AMMONIA 38* 25 33   CBC: Recent Labs  Lab 11/07/23 0040 11/08/23 0535 11/10/23 1650 11/11/23 0503 11/12/23 0659  WBC 3.4* 3.4* 2.8* 4.1 5.1  NEUTROABS  --   --   --   --  3.9  HGB 8.2* 10.0* 9.2* 10.3* 9.7*  HCT 26.4* 31.8* 29.6* 32.6* 30.6*  MCV 98.5 98.5 99.7 98.8 96.5  PLT 115* 126* 134* 123* 128*   Cardiac Enzymes: No results for input(s): "CKTOTAL", "CKMB", "CKMBINDEX", "TROPONINI" in the last 168 hours. CBG: Recent Labs  Lab 11/11/23 0834 11/11/23 1207 11/11/23 1618  11/11/23 2111 11/12/23 0834  GLUCAP 75 78 108* 77 92    Iron Studies: No results for input(s): "IRON", "TIBC", "TRANSFERRIN", "FERRITIN" in the last 72 hours. Studies/Results: CT ABDOMEN PELVIS WO CONTRAST Result Date: 11/10/2023 CLINICAL DATA:  Right lower quadrant abdominal pain. EXAM: CT ABDOMEN AND PELVIS WITHOUT CONTRAST TECHNIQUE: Multidetector CT imaging of the abdomen and pelvis was performed following the standard protocol without IV contrast. RADIATION DOSE REDUCTION: This exam was performed according to the departmental dose-optimization program which includes automated exposure control, adjustment of the mA and/or kV according to patient size and/or use of iterative reconstruction technique. COMPARISON:  CT abdomen pelvis dated 04/27/2014. FINDINGS: Evaluation of this exam is limited in the absence of intravenous contrast. Lower chest: Trace right pleural effusion. There is associated minimal compressive atelectasis of the right lung base. Three vessel coronary vascular calcification. No intra-abdominal free air.  Trace free fluid in the lower abdomen. Hepatobiliary: Small cyst in the anterior liver. No biliary dilatation. No calcified gallstone. Probable sludge or noncalcified stone in the gallbladder. No pericholecystic fluid or evidence of acute cholecystitis by CT. Pancreas: Unremarkable. No pancreatic ductal dilatation or surrounding inflammatory changes. Spleen: Normal in size without focal abnormality. Adrenals/Urinary Tract: The adrenal glands are unremarkable. Mild bilateral hydronephrosis. There is no stone  on either side. Left renal inferior pole cyst measures 12 cm. Additional smaller cyst noted in the inferior pole of the left kidney. Indeterminate 9 mm higher attenuating lesion from the upper pole of the left kidney may represent a complex/proteinaceous cyst. The visualized ureters appear unremarkable. The urinary bladder is distended and grossly unremarkable. Stomach/Bowel: There  is no bowel obstruction or active inflammation. The appendix is normal. Vascular/Lymphatic: Moderate aortoiliac atherosclerotic disease. The IVC is unremarkable. No portal venous gas. There is no adenopathy. Reproductive: Enlarged prostate gland measuring 6.5 cm in transverse axial diameter. The seminal vesicles are symmetric. Other: There is bilateral perinephric stranding extending down along the paracolic gutters and adjacent to the urinary bladder. This is nonspecific and may be fluid related to renal cyst or represent underlying inflammatory/infectious process. Correlation with urinalysis recommended. Musculoskeletal: Osteopenia.  No acute osseous pathology. IMPRESSION: 1. Mild bilateral hydronephrosis. No stone on either side. 2. Bilateral perinephric stranding, nonspecific. Correlation with urinalysis recommended. 3. Enlarged prostate gland. 4. Trace right pleural effusion. Electronically Signed   By: Elgie Collard M.D.   On: 11/10/2023 12:05    acetaminophen  650 mg Oral TID WC & HS   calcium carbonate  500 mg of elemental calcium Oral BID WC   carvedilol  6.25 mg Oral BID WC   cephALEXin  500 mg Oral Q8H   Chlorhexidine Gluconate Cloth  6 each Topical Daily   dabigatran  75 mg Oral Q12H   feeding supplement  1 Container Oral BID BM   folic acid  1 mg Oral Daily   hydrALAZINE  50 mg Oral Q8H   insulin aspart  0-9 Units Subcutaneous TID WC   isosorbide mononitrate  30 mg Oral Daily   melatonin  5 mg Oral QHS   rosuvastatin  20 mg Oral Daily   simethicone  80 mg Oral QID   sodium chloride flush  3 mL Intravenous Q12H   tamsulosin  0.4 mg Oral Daily   traZODone  50 mg Oral QHS    BMET    Component Value Date/Time   NA 135 11/12/2023 0659   NA 139 05/11/2023 1007   K 4.2 11/12/2023 0659   CL 103 11/12/2023 0659   CO2 19 (L) 11/12/2023 0659   GLUCOSE 97 11/12/2023 0659   BUN 33 (H) 11/12/2023 0659   BUN 24 05/11/2023 1007   CREATININE 2.60 (H) 11/12/2023 0659   CREATININE 1.34  (H) 03/18/2023 1026   CALCIUM 8.2 (L) 11/12/2023 0659   GFRNONAA 25 (L) 11/12/2023 0659   GFRNONAA 56 (L) 03/18/2023 1026   GFRAA >60 04/26/2018 0901   CBC    Component Value Date/Time   WBC 5.1 11/12/2023 0659   RBC 3.17 (L) 11/12/2023 0659   HGB 9.7 (L) 11/12/2023 0659   HGB 13.1 03/18/2023 1026   HCT 30.6 (L) 11/12/2023 0659   PLT 128 (L) 11/12/2023 0659   PLT 134 (L) 03/18/2023 1026   MCV 96.5 11/12/2023 0659   MCH 30.6 11/12/2023 0659   MCHC 31.7 11/12/2023 0659   RDW 19.0 (H) 11/12/2023 0659   LYMPHSABS 0.6 (L) 11/12/2023 0659   MONOABS 0.6 11/12/2023 0659   EOSABS 0.1 11/12/2023 0659   BASOSABS 0.0 11/12/2023 0659     Assessment/Plan:  AKI with hematuria on CKD3b: Baseline appears around 1.3-1.4. Cr steadily increased to 3.51 and now decreased to 2.60 after foley insertion 2/13. Feel initial presentation of renal dysfunction was AKI/ATN due to dehydration with subsequent obstructive uropathy d/t BPH. Given  improvement, he will need to go home with foley and flomax and attempt voiding trial with urology follow up. Also recommend nephrology follow up. Continue keflex course for ?UTI. We will sign off now. Hematuria: Updated [place] with continued hematuria. Renal US with mass in the bladder which could be contributing. Continue to recommend cystoscopy at some point with urology. Unlikely renal stone given nonobstructive nature as seen on abd [place] and CT. Hypokalemia, hypomagnesemia, hypocalcemia: Previously resolved. K and Ca stable. Can consider repletion with Mg (1.5) today if he has close follow up for recheck given GFR<30. Abdominal pain: Improved this AM. Abd [place] with splenomegaly, gallstones with no ductal dilation, renal cysts, and several structures poorly seen with overlapping bowel gas and soft tissue. CTAP as per problem #1. Feel combined effects of urinary retention +/- UTI. Continue management as above. Functional deficits 2/2 debility from GI bleed/delirium: Improving from  functional standpoint. Has HHPT when discharged. PAF/SVT/HTN: Per primary team. Continuing home medications other than ARB. Can consider adding on amlodipine for BP control, though acceptable BP at this time.  Janeal Holmes, MD Family Medicine Resident, PGY-2 9:27 AM 11/12/2023  [employer] [phone]

## 2023-11-12 NOTE — Progress Notes (Addendum)
PROGRESS NOTE                                                                                                                                                                                                             Patient Demographics:    Joshua [name], is a 73 y.o. male, DOB - March 22, 1951, WUJ:[id number]  Outpatient Primary MD for the patient is Joshua Fillers, MD    LOS - 2  Admit date - 11/10/2023    No chief complaint on file.      Brief Narrative (HPI from H&P)    73 y.o. male with medical history significant of hypertension, hyperlipidemia, CAD, diabetes, anemia, CKD 3, atrial fibrillation, pancytopenia, sleep disturbance, delirium, obesity, status post laminectomy, GI bleed presenting with AKI from inpatient rehab.   Patient was admitted 12/27-1/26 with encephalopathy and GI bleed in the setting of increased INR.  Found to have AVMs treated with APC.  GI bleeding resolved, hemoglobin stabilized, patient was resumed on Pradaxa.  Also noted to have loculated effusion and had chest tube placed x 2 in consultation with pulmonology and CT surgery.  Received lytics x 2 and chest tube was taken out on 1/17.  Also treated for AKI that admission.   Ultimately discharged to inpatient rehab due to decline from prolonged admission and ongoing issues with delirium.  Regimen there was eventually found that helped improve patient's delirium/cognition and sleep and he remains on this.   Patient was achieving his rehab goals and was approaching discharge in the last few days but has subsequently developed AKI.  Creatinine trend in the last 4 days has been 1.5, 2.0, 3.1, 3.5.  Patient has also reported some right lower quadrant abdominal pain.  Was admitted to the medical hospital for further care.   Subjective:   Patient in bed, appears comfortable, denies any headache, no fever, no chest pain or pressure, no shortness of breath ,  no abdominal pain. No new focal weakness.    Assessment  & Plan :   AKI on CKD 3.  Baseline creatinine around 1.5. On exam today he has suprapubic fullness, apparently has been having suprapubic fullness and discomfort for several days, Foley catheter placed with over 1500 cc of urine returned, he was also on ARB which was stopped yesterday, will continue Flomax, follow urine culture, outpatient urology  follow-up.  Function improving, nephrology signed off.  Will require outpatient urology follow-up family setting up appointment with his primary urologist Dr. Jacquelyne [name] in the next 7 to 10 days.   Hypertension - Continue home Coreg, hydralazine, Imdur add as needed hydralazine - Holding ARB as above   Hyperlipidemia - Continue home rosuvastatin   CAD - Continue home Coreg, Imdur, rosuvastatin - Holding ARB   Diabetes - SSI   Anemia of chronic disease > Hemoglobin stable 10.0 - Trend CBC   Atrial fibrillation - Continue home Coreg -Resume Pradaxa as CBC stable on 11/11/2023   Pancytopenia > Follows outpatient.  Suspected myelodysplastic syndrome. Monitor.   Sleep disturbance Delirium - Continue melatonin, trazodone   Obesity - Noted   Hypomagnesemia.  Replace.    Recent prolonged hospitalization > Status post CIR stay, has reached goals. - Continue with PT as needed.        Condition - Fair  Family Communication  :  wife Joshua [name] [phone] on 11/12/2023  Code Status :  DNR  Consults  :  Renal  PUD Prophylaxis :    Procedures  :     CT - : 1. Mild bilateral hydronephrosis. No stone on either side. 2. Bilateral perinephric stranding, nonspecific. Correlation with urinalysis recommended. 3. Enlarged prostate gland. 4. Trace right pleural effusion.      Disposition Plan  :    Status is: Inpatient  DVT Prophylaxis  :    Lab Results  Component Value Date   PLT 128 (L) 11/12/2023    Diet :  Diet Order             Diet heart healthy/carb modified Room  service appropriate? Yes; Fluid consistency: Thin  Diet effective now                    Inpatient Medications  Scheduled Meds:  acetaminophen  650 mg Oral TID WC & HS   calcium carbonate  500 mg of elemental calcium Oral BID WC   carvedilol  6.25 mg Oral BID WC   cephALEXin  500 mg Oral Q8H   Chlorhexidine Gluconate Cloth  6 each Topical Daily   dabigatran  75 mg Oral Q12H   feeding supplement  1 Container Oral BID BM   folic acid  1 mg Oral Daily   hydrALAZINE  50 mg Oral Q8H   insulin aspart  0-9 Units Subcutaneous TID WC   isosorbide mononitrate  30 mg Oral Daily   melatonin  5 mg Oral QHS   rosuvastatin  20 mg Oral Daily   simethicone  80 mg Oral QID   sodium chloride flush  3 mL Intravenous Q12H   tamsulosin  0.4 mg Oral Daily   traZODone  50 mg Oral QHS   Continuous Infusions:  lactated ringers     magnesium sulfate bolus IVPB     PRN Meds:.alum & mag hydroxide-simeth, bisacodyl, guaiFENesin-dextromethorphan, hydrALAZINE, hydrocortisone cream, lidocaine, ondansetron, mouth rinse, polyethylene glycol, sodium chloride, traMADol  Antibiotics  :    Anti-infectives (From admission, onward)    Start     Dose/Rate Route Frequency Ordered Stop   11/11/23 1400  cephALEXin (KEFLEX) capsule 500 mg        500 mg Oral Every 8 hours 11/11/23 1205 11/16/23 1359         Objective:   Vitals:   11/11/23 2302 11/12/23 0400 11/12/23 0800 11/12/23 0940  BP: (!) 152/66 (!) 169/59 (!) 125/52   Pulse: 62 64  Resp: 19 (!) 23 (!) 25   Temp: 99 F (37.2 C) 99 F (37.2 C) 99 F (37.2 C)   TempSrc: Oral Oral Oral   SpO2: 98% 97%    Weight:    97.5 kg    Wt Readings from Last 3 Encounters:  11/12/23 97.5 kg  10/31/23 102.7 kg  10/20/23 102.6 kg     Intake/Output Summary (Last 24 hours) at 11/12/2023 0942 Last data filed at 11/12/2023 1610 [name] per 24 hour  Intake --  Output 2675 ml  Net -2675 ml     Physical Exam  Awake Alert, No new F.N deficits, Normal  affect [place].AT,PERRAL Supple Neck, No JVD,   Symmetrical Chest wall movement, Good air movement bilaterally, CTAB RRR,No Gallops,Rubs or new Murmurs,  +ve B.Sounds, suprapubic fullness and tenderness on palpation,    No Cyanosis, Clubbing or edema       Data Review:    Recent Labs  Lab 11/07/23 0040 11/08/23 0535 11/10/23 1650 11/11/23 0503 11/12/23 0659  WBC 3.4* 3.4* 2.8* 4.1 5.1  HGB 8.2* 10.0* 9.2* 10.3* 9.7*  HCT 26.4* 31.8* 29.6* 32.6* 30.6*  PLT 115* 126* 134* 123* 128*  MCV 98.5 98.5 99.7 98.8 96.5  MCH 30.6 31.0 31.0 31.2 30.6  MCHC 31.1 31.4 31.1 31.6 31.7  RDW 20.0* 19.7* 19.6* 19.2* 19.0*  LYMPHSABS  --   --   --   --  0.6*  MONOABS  --   --   --   --  0.6  EOSABS  --   --   --   --  0.1  BASOSABS  --   --   --   --  0.0    Recent Labs  Lab 11/07/23 0040 11/08/23 0535 11/08/23 1931 11/09/23 0507 11/09/23 1037 11/10/23 0523 11/11/23 0906 11/12/23 0659  NA 136 139 136 138  --  139 138 135  K 4.3 4.5 4.7 3.9  --  4.3 5.0 4.2  CL 105 102 103 102  --  105 107 103  CO2 22 17* 20* 19*  --  17* 15* 19*  ANIONGAP 9 20* 13 17*  --  17* 16* 13  GLUCOSE 104* 108* 96 92  --  81 74 97  BUN 32* 34* 35* 35*  --  38* 41* 33*  CREATININE 1.51* 2.01* 2.81* 3.15*  --  3.51* 3.99* 2.60*  AMMONIA  --   --   --   --  38*  --  25 33  BNP 268.1* 379.1*  --   --   --   --  422.3* 395.9*  MG  --   --   --   --   --   --   --  1.5*  PHOS  --   --   --   --   --   --  6.0* 3.6  CALCIUM 8.6* 9.8 9.0 9.2  --  8.9 8.3* 8.2*      Recent Labs  Lab 11/07/23 0040 11/08/23 0535 11/08/23 1931 11/09/23 0507 11/09/23 1037 11/10/23 0523 11/11/23 0906 11/12/23 0659  AMMONIA  --   --   --   --  38*  --  25 33  BNP 268.1* 379.1*  --   --   --   --  422.3* 395.9*  MG  --   --   --   --   --   --   --  1.5*  CALCIUM 8.6* 9.8 9.0 9.2  --  8.9 8.3* 8.2*    --------------------------------------------------------------------------------------------------------------- Lab Results   Component Value Date   CHOL 119 04/29/2010   HDL 24.30 (L) 04/29/2010   LDLCALC 75 04/29/2010   TRIG 97.0 04/29/2010   CHOLHDL 5 04/29/2010    Lab Results  Component Value Date   HGBA1C 5.5 09/24/2023     Micro Results Recent Results (from the past 240 hours)  Urine Culture     Status: Abnormal   Collection Time: 11/02/23  3:10 PM   Specimen: Urine, Clean Catch  Result Value Ref Range Status   Specimen Description URINE, CLEAN CATCH  Final   Special Requests NONE Reflexed from [id number]  Final   Culture (A)  Final    <10,000 COLONIES/mL INSIGNIFICANT GROWTH Performed at [facility], [address] [place], [place] [address]    Report Status 11/03/2023 FINAL  Final    Radiology Report CT ABDOMEN PELVIS WO CONTRAST Result Date: 11/10/2023 CLINICAL DATA:  Right lower quadrant abdominal pain. EXAM: CT ABDOMEN AND PELVIS WITHOUT CONTRAST TECHNIQUE: Multidetector CT imaging of the abdomen and pelvis was performed following the standard protocol without IV contrast. RADIATION DOSE REDUCTION: This exam was performed according to the departmental dose-optimization program which includes automated exposure control, adjustment of the mA and/or kV according to patient size and/or use of iterative reconstruction technique. COMPARISON:  CT abdomen pelvis dated 04/27/2014. FINDINGS: Evaluation of this exam is limited in the absence of intravenous contrast. Lower chest: Trace right pleural effusion. There is associated minimal compressive atelectasis of the right lung base. Three vessel coronary vascular calcification. No intra-abdominal free air.  Trace free fluid in the lower abdomen. Hepatobiliary: Small cyst in the anterior liver. No biliary dilatation. No calcified gallstone. Probable sludge or noncalcified stone in the gallbladder. No pericholecystic fluid or evidence of acute cholecystitis by CT. Pancreas: Unremarkable. No pancreatic ductal dilatation or surrounding inflammatory changes.  Spleen: Normal in size without focal abnormality. Adrenals/Urinary Tract: The adrenal glands are unremarkable. Mild bilateral hydronephrosis. There is no stone on either side. Left renal inferior pole cyst measures 12 cm. Additional smaller cyst noted in the inferior pole of the left kidney. Indeterminate 9 mm higher attenuating lesion from the upper pole of the left kidney may represent a complex/proteinaceous cyst. The visualized ureters appear unremarkable. The urinary bladder is distended and grossly unremarkable. Stomach/Bowel: There is no bowel obstruction or active inflammation. The appendix is normal. Vascular/Lymphatic: Moderate aortoiliac atherosclerotic disease. The IVC is unremarkable. No portal venous gas. There is no adenopathy. Reproductive: Enlarged prostate gland measuring 6.5 cm in transverse axial diameter. The seminal vesicles are symmetric. Other: There is bilateral perinephric stranding extending down along the paracolic gutters and adjacent to the urinary bladder. This is nonspecific and may be fluid related to renal cyst or represent underlying inflammatory/infectious process. Correlation with urinalysis recommended. Musculoskeletal: Osteopenia.  No acute osseous pathology. IMPRESSION: 1. Mild bilateral hydronephrosis. No stone on either side. 2. Bilateral perinephric stranding, nonspecific. Correlation with urinalysis recommended. 3. Enlarged prostate gland. 4. Trace right pleural effusion. Electronically Signed   By: Elgie Collard M.D.   On: 11/10/2023 12:05     Signature  -   Susa Raring M.D on 11/12/2023 at 9:42 AM   -  To page go to [email or web address]

## 2023-11-12 NOTE — Progress Notes (Signed)
Physical Therapy Treatment Patient Details Name: Joshua [name] MRN: [id number] DOB: [birth year] Today's Date: 11/12/2023   History of Present Illness Pt is 73 yo male presenting from CIR on 11/10/23 with AKI. PMH:  hypertension, hyperlipidemia, CAD, diabetes, anemia, CKD 3, atrial fibrillation, pancytopenia, sleep disturbance, delirium, obesity, status post laminectomy, GI bleed    PT Comments  Pt received in supine and agreeable to session.  Pt reports bowel incontinence, so gait trial deferred. Pt able to stand from EOB and perform 3 trials of static marching with and without UE support for increased balance challenge. Pt demonstrates increased instability and LOB without UE support requiring up to min A to correct. Pt reports need to use BSC and is able to pivot with increased cues for sequencing. Pt able to stand from EOB x10, however reports increased fatigue. Pt continues to benefit from PT services to progress toward functional mobility goals.    If plan is discharge home, recommend the following: Assistance with cooking/housework;Assist for transportation;Help with stairs or ramp for entrance;A little help with walking and/or transfers;A little help with bathing/dressing/bathroom   Can travel by private vehicle     Yes  Equipment Recommendations  None recommended by PT    Recommendations for Other Services       Precautions / Restrictions Precautions Precautions: Fall     Mobility  Bed Mobility Overal bed mobility: Needs Assistance Bed Mobility: Sit to Supine, Supine to Sit     Supine to sit: HOB elevated, Used rails, Min assist Sit to supine: Min assist   General bed mobility comments: Min A for trunk elevation and BLE elevation back to EOB. Cues for use of bedrail and sequencing    Transfers Overall transfer level: Needs assistance Equipment used: Rolling walker (2 wheels) Transfers: Sit to/from Stand, Bed to chair/wheelchair/BSC Sit to Stand: Contact guard assist,  Supervision   Step pivot transfers: Contact guard assist       General transfer comment: STS from EOB and BSC with CGA progressing to supervision for safety. Pt able to pivot to/from [facility] with cues for sequencing due to decreased problem solving    Ambulation/Gait             Pre-gait activities: static standing marches with and without UE support General Gait Details: deferred due to bowel incontinence      Balance Overall balance assessment: Needs assistance Sitting-balance support: Feet supported, No upper extremity supported Sitting balance-Leahy Scale: Good     Standing balance support: During functional activity, Bilateral upper extremity supported, No upper extremity supported Standing balance-Leahy Scale: Fair Standing balance comment: Pt relies on UE support during dynamic balance challenge. Pt attempts static marching without UE support, however requires min A to correct LOB each trial                            Communication Communication Communication: No apparent difficulties  Cognition Arousal: Alert Behavior During Therapy: Flat affect   PT - Cognitive impairments: No apparent impairments                                Cueing    Exercises Other Exercises Other Exercises: Static standing marches x3 trials Other Exercises: x10 serial STS    General Comments        Pertinent Vitals/Pain Pain Assessment Pain Assessment: 0-10 Pain Score: 3  Pain Location: abdomen Pain Descriptors /  Indicators: Aching Pain Intervention(s): Limited activity within patient's tolerance, Monitored during session, Repositioned     PT Goals (current goals can now be found in the care plan section) Acute Rehab PT Goals Patient Stated Goal: to go home-per family PT Goal Formulation: With patient/family Time For Goal Achievement: 11/25/23 Progress towards PT goals: Progressing toward goals    Frequency    Min 1X/week       AM-PAC PT  "6 Clicks" Mobility   Outcome Measure  Help needed turning from your back to your side while in a flat bed without using bedrails?: A Little Help needed moving from lying on your back to sitting on the side of a flat bed without using bedrails?: A Little Help needed moving to and from a bed to a chair (including a wheelchair)?: A Little Help needed standing up from a chair using your arms (e.g., wheelchair or bedside chair)?: A Little Help needed to walk in hospital room?: A Little Help needed climbing 3-5 steps with a railing? : A Lot 6 Click Score: 17    End of Session Equipment Utilized During Treatment: Gait belt Activity Tolerance: Patient limited by fatigue;Patient tolerated treatment well Patient left: with call bell/phone within reach;with family/visitor present;in bed Nurse Communication: Mobility status PT Visit Diagnosis: Difficulty in walking, not elsewhere classified (R26.2);Pain     Time: 2952-8413 PT Time Calculation (min) (ACUTE ONLY): 26 min  Charges:    $Therapeutic Exercise: 8-22 mins $Therapeutic Activity: 8-22 mins PT General Charges $$ ACUTE PT VISIT: 1 Visit                     Johny Shock, PTA Acute Rehabilitation Services Secure Chat Preferred  [phone]    Johny Shock 11/12/2023, 11:52 AM

## 2023-11-12 NOTE — Care Management Important Message (Addendum)
Important Message  Patient Details  Name: Joshua [name] MRN: [id number] Date of Birth: 01/11/1951   Important Message Given:  Yes - Medicare IM    Dorena Bodo 11/12/2023, 3:43 PM

## 2023-11-12 NOTE — Evaluation (Signed)
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/03/2023   Debility 10/24/2023   Acute respiratory failure with hypoxia (HCC) 10/20/2023   Gross hematuria 10/20/2023   Morbid obesity (HCC) 10/20/2023   Coagulopathy (HCC) 10/20/2023   Hypokalemia 10/20/2023   Medication management 10/16/2023   Odynophagia 10/15/2023   Need for emotional support 10/13/2023   Palliative care encounter 10/12/2023   Goals of care, counseling/discussion 10/12/2023   Counseling and coordination of care 10/12/2023   Pleural effusion 10/10/2023   Hemothorax on right 10/01/2023   Hemothorax 09/30/2023   Pneumothorax 09/30/2023   Anemia of chronic disease 09/24/2023   Gastrointestinal hemorrhage with melena 09/24/2023   Hyperglycemia 09/24/2023   Weight loss 09/24/2023   Thrombocytopenia (HCC) 09/24/2023   Hyponatremia 09/24/2023   Acute on chronic renal failure (HCC) 09/24/2023   CKD stage 3a, GFR 45-59 ml/min (HCC) 09/24/2023   S/P lumbar laminectomy 04/29/2018   Hearing problem    Chest pain, unspecified 06/23/2016   Abnormal nuclear stress test 09/27/2014   Crescendo angina (HCC) 09/26/2014   Pulmonary hypertension (HCC) 09/26/2014   Internal hemorrhoids 01/19/2014   Diverticulosis of colon without hemorrhage 01/19/2014   Paroxysmal atrial fibrillation (HCC) 04/29/2010   PALPITATIONS 04/21/2010   External hemorrhoids 11/01/2009   HYPERKALEMIA 09/09/2009   Type 2 diabetes mellitus (HCC) 08/16/2009   Hyperlipidemia  08/16/2009   Essential hypertension 08/16/2009   Coronary artery disease with chronic angina 08/16/2009   Disorder resulting from impaired renal function 08/16/2009   Past Medical History:  Past Medical History:  Diagnosis Date   Arthritis    Atrial fibrillation (HCC)    Atrial flutter (HCC)    Back pain    CAD (coronary artery disease)    stents placed 2009   Cholelithiasis    Complication of anesthesia    woke up during surgery and ablation   DM (diabetes mellitus) (HCC)    type 2   Elevated PSA    Gout    Hearing problem    HTN (hypertension)    Hyperlipidemia    Myocardial infarct, old 2010   Prostatitis    Renal insufficiency    Past Surgical History:  Past Surgical History:  Procedure Laterality Date   CARDIAC CATHETERIZATION  11/07/07   with 2 stents   COLONOSCOPY     Electrophysiologic study and RF catheter ablation   of AV node reentrant tachycardia.     ESOPHAGOGASTRODUODENOSCOPY (EGD) WITH PROPOFOL N/A 09/30/2023   Procedure: ESOPHAGOGASTRODUODENOSCOPY (EGD) WITH PROPOFOL;  Surgeon: Lynann Bologna, MD;  Location: [facility] ENDOSCOPY;  Service: Gastroenterology;  Laterality: N/A;   EXTERNAL EAR SURGERY Right    cartilage   HOT HEMOSTASIS N/A 09/30/2023   Procedure: HOT HEMOSTASIS (ARGON PLASMA COAGULATION/BICAP);  Surgeon: Lynann Bologna, MD;  Location: [facility];  Service: Gastroenterology;  Laterality: N/A;   LEFT AND RIGHT HEART CATHETERIZATION WITH CORONARY/GRAFT ANGIOGRAM N/A 09/27/2014   Procedure: LEFT AND RIGHT HEART CATHETERIZATION WITH Isabel Caprice;  Surgeon: Lesleigh Noe, MD;  Location[facility] LAB;  Service: Cardiovascular;  Laterality: N/A;   LUMBAR LAMINECTOMY/DECOMPRESSION MICRODISCECTOMY  Left 04/29/2018   Procedure: Left Lumbar Four-Five Laminectomy/Foraminotomy;  Surgeon: Tia Alert, MD;  Location: [facility] OR;  Service: Neurosurgery;  Laterality: Left;  Left Lumbar Four-Five Laminectomy/Foraminotomy   TOOTH EXTRACTION     VASECTOMY      HPI:  Joshua [name] is a 73 yo male presenting from AIR 2/12 with AKI. Working with SLP in AIR targeting goals related to initiation, processing speed, memory, attention, awareness, and executive functioning with recommendations to continue f/u at next venue of care. He scored 14/30 on the SLUMS 10/26/23. PMH includes HTN, HLD, CAD, DM, anemia, CKD 3, A-fib, pancytopenia, sleep disturbance, delirium, obesity, GI bleed, s/p laminectomy   Assessment / Plan / Recommendation Clinical Impression  Pt reports no changes in cognition since being admitted from AIR, where he was working with SLP and had met 4/4 long term goals including demonstrating orientation with cueing, demonstrating problem solving for basic/complex daily situations, demonstrating ability for day to day recall/carryover, and demonstrating awareness during functional activities. Today, pt presents with deficits related to awareness, initiation, attention, memory, and problem solving. He had difficulty with both storage and retrieval of four novel words, although effectively recalled them after a delay given semantic cueing. He was unsuccessful with simple addition and multiplication calculations. His awareness continues to appear to be impaired. Interaction with this SLP was limited overall, but question effort. Feel that pt may benefit from continued f/u with [facility] SLP upon d/c. Discussed with pt and his family, who verbalize agreement. Will s/o acutely.    SLP Assessment  SLP Recommendation/Assessment: All further Speech Lanaguage Pathology  needs can be addressed in the next venue of care SLP Visit Diagnosis: Cognitive communication deficit (R41.841)    Recommendations for follow up therapy are one component of a multi-disciplinary discharge planning process, led by the attending physician.  Recommendations may be updated based on patient status, additional functional criteria and insurance authorization.    Follow Up Recommendations   Home health SLP    Assistance Recommended at Discharge  Intermittent Supervision/Assistance  Functional Status Assessment Patient has had a recent decline in their functional status and demonstrates the ability to make significant improvements in function in a reasonable and predictable amount of time.  Frequency and Duration           SLP Evaluation Cognition  Overall Cognitive Status: History of cognitive impairments - at baseline Arousal/Alertness: Awake/alert Orientation Level: Oriented X4 Attention: Sustained Sustained Attention: Impaired Sustained Attention Impairment: Verbal basic;Functional basic Memory: Impaired Memory Impairment: Storage deficit;Retrieval deficit Awareness: Impaired Awareness Impairment: Intellectual impairment Problem Solving: Impaired Problem Solving Impairment: Verbal basic       Comprehension  Auditory Comprehension Overall Auditory Comprehension: Appears within functional limits for tasks assessed    Expression Expression Primary Mode of Expression: Verbal Verbal Expression Overall Verbal Expression: Appears within functional limits for tasks assessed   Oral / Motor  Oral Motor/Sensory Function Overall Oral Motor/Sensory Function: Within functional limits Motor Speech Overall Motor Speech: Appears within functional limits for tasks assessed            Gwynneth Aliment, M.A., CF-SLP Speech Language Pathology, Acute Rehabilitation Services  Secure Chat preferred [phone]  11/12/2023, 12:52 PM

## 2023-11-12 NOTE — TOC Progression Note (Signed)
Transition of Care [facility]) - Progression Note    Patient Details  Name: Joshua [name] MRN: [id number] Date of Birth: 01-19-1951  [facility] CM/SW Contact  Gordy Clement, RN Phone Number: 11/12/2023, 3:57 PM  Clinical Narrative:    Anticipate patient will DC on Sunday 2/16  Home Health  (PT/SLP and RN- foley) will be provided by [facility]. Orders are in. No DME needed. AVS updated. Anticipate Family will transport       Expected Discharge Plan: Home w Home Health Services Barriers to Discharge: Continued Medical Work up  Expected Discharge Plan and Services   Discharge Planning Services: CM Consult   Living arrangements for the past 2 months: Single Family Home                                       Social Determinants of Health (SDOH) Interventions SDOH Screenings   Food Insecurity: No Food Insecurity (11/11/2023)  Housing: Low Risk  (11/11/2023)  Transportation Needs: No Transportation Needs (11/11/2023)  Utilities: Not At Risk (11/11/2023)  Social Connections: Moderately Integrated (11/11/2023)  Tobacco Use: Medium Risk (10/24/2023)    Readmission Risk Interventions    09/27/2023   10:04 AM  Readmission Risk Prevention Plan  Transportation Screening Complete  PCP or Specialist Appt within 5-7 Days Complete  Home Care Screening Complete  Medication Review (RN CM) Complete

## 2023-11-12 NOTE — Plan of Care (Signed)
Problem: Education: Goal: Knowledge of General Education information will improve Description: Including pain rating scale, medication(s)/side effects and non-pharmacologic comfort measures Outcome: Progressing   Problem: Health Behavior/Discharge Planning: Goal: Ability to manage health-related needs will improve Outcome: Progressing   Problem: Clinical Measurements: Goal: Will remain free from infection Outcome: Progressing Goal: Diagnostic test results will improve Outcome: Progressing Goal: Respiratory complications will improve Outcome: Progressing   Problem: Activity: Goal: Risk for activity intolerance will decrease Outcome: Progressing

## 2023-11-12 NOTE — Plan of Care (Signed)

## 2023-11-13 DIAGNOSIS — N179 Acute kidney failure, unspecified: Secondary | ICD-10-CM | POA: Diagnosis not present

## 2023-11-13 DIAGNOSIS — N1831 Chronic kidney disease, stage 3a: Secondary | ICD-10-CM | POA: Diagnosis not present

## 2023-11-13 LAB — CBC WITH DIFFERENTIAL/PLATELET
Abs Immature Granulocytes: 0.04 10*3/uL (ref 0.00–0.07)
Basophils Absolute: 0 10*3/uL (ref 0.0–0.1)
Basophils Relative: 0 %
Eosinophils Absolute: 0.1 10*3/uL (ref 0.0–0.5)
Eosinophils Relative: 2 %
HCT: 30.5 % — ABNORMAL LOW (ref 39.0–52.0)
Hemoglobin: 9.6 g/dL — ABNORMAL LOW (ref 13.0–17.0)
Immature Granulocytes: 1 %
Lymphocytes Relative: 10 %
Lymphs Abs: 0.7 10*3/uL (ref 0.7–4.0)
MCH: 30.5 pg (ref 26.0–34.0)
MCHC: 31.5 g/dL (ref 30.0–36.0)
MCV: 96.8 fL (ref 80.0–100.0)
Monocytes Absolute: 0.7 10*3/uL (ref 0.1–1.0)
Monocytes Relative: 10 %
Neutro Abs: 5.3 10*3/uL (ref 1.7–7.7)
Neutrophils Relative %: 77 %
Platelets: 135 10*3/uL — ABNORMAL LOW (ref 150–400)
RBC: 3.15 MIL/uL — ABNORMAL LOW (ref 4.22–5.81)
RDW: 18.9 % — ABNORMAL HIGH (ref 11.5–15.5)
WBC: 6.8 10*3/uL (ref 4.0–10.5)
nRBC: 0 % (ref 0.0–0.2)

## 2023-11-13 LAB — BASIC METABOLIC PANEL
Anion gap: 11 (ref 5–15)
BUN: 26 mg/dL — ABNORMAL HIGH (ref 8–23)
CO2: 19 mmol/L — ABNORMAL LOW (ref 22–32)
Calcium: 8.4 mg/dL — ABNORMAL LOW (ref 8.9–10.3)
Chloride: 104 mmol/L (ref 98–111)
Creatinine, Ser: 1.9 mg/dL — ABNORMAL HIGH (ref 0.61–1.24)
GFR, Estimated: 37 mL/min — ABNORMAL LOW (ref >60)
Glucose, Bld: 110 mg/dL — ABNORMAL HIGH (ref 70–99)
Potassium: 3.8 mmol/L (ref 3.5–5.1)
Sodium: 134 mmol/L — ABNORMAL LOW (ref 135–145)

## 2023-11-13 LAB — MAGNESIUM: Magnesium: 2 mg/dL (ref 1.7–2.4)

## 2023-11-13 LAB — GLUCOSE, CAPILLARY
Glucose-Capillary: 107 mg/dL — ABNORMAL HIGH (ref 70–99)
Glucose-Capillary: 114 mg/dL — ABNORMAL HIGH (ref 70–99)
Glucose-Capillary: 121 mg/dL — ABNORMAL HIGH (ref 70–99)
Glucose-Capillary: 114 mg/dL — ABNORMAL HIGH (ref 70–99)
Glucose-Capillary: 93 mg/dL (ref 70–99)

## 2023-11-13 NOTE — Progress Notes (Signed)
PROGRESS NOTE                                                                                                                                                                                                             Patient Demographics:    Joshua [name], is a 73 y.o. male, DOB - 1950-11-27, ZOX:[id number]  Outpatient Primary MD for the patient is [name], MD    LOS - 3  Admit date - 11/10/2023    No chief complaint on file.      Brief Narrative (HPI from H&P)    73 y.o. male with medical history significant of hypertension, hyperlipidemia, CAD, diabetes, anemia, CKD 3, atrial fibrillation, pancytopenia, sleep disturbance, delirium, obesity, status post laminectomy, GI bleed presenting with AKI from inpatient rehab.   Patient was admitted 12/27-1/26 with encephalopathy and GI bleed in the setting of increased INR.  Found to have AVMs treated with APC.  GI bleeding resolved, hemoglobin stabilized, patient was resumed on Pradaxa.  Also noted to have loculated effusion and had chest tube placed x 2 in consultation with pulmonology and CT surgery.  Received lytics x 2 and chest tube was taken out on 1/17.  Also treated for AKI that admission.   Ultimately discharged to inpatient rehab due to decline from prolonged admission and ongoing issues with delirium.  Regimen there was eventually found that helped improve patient's delirium/cognition and sleep and he remains on this.   Patient was achieving his rehab goals and was approaching discharge in the last few days but has subsequently developed AKI.  Creatinine trend in the last 4 days has been 1.5, 2.0, 3.1, 3.5.  Patient has also reported some right lower quadrant abdominal pain.  Was admitted to the medical hospital for further care.   Subjective:   Patient in bed, appears comfortable, denies any headache, no fever, no chest pain or pressure, no shortness of breath ,  no abdominal pain. No new focal weakness.   Assessment  & Plan :   AKI on CKD 3.  Baseline creatinine around 1.5. On exam he had suprapubic fullness, Foley catheter was placed over 1500 cc of urine was obtained, Foley catheter was placed 11/11/2023, will continue Flomax, follow urine culture, outpatient urology follow-up.  Renal function is now serially improving, nephrology signed off.  Will require outpatient urology  follow-up family setting up appointment with his primary urologist [name] in the next 7 to 10 days.  If continues to improve likely discharge home with Foley catheter on 11/14/2023.   Hypertension - Continue home Coreg, hydralazine, Imdur add as needed hydralazine - Holding ARB as above   Hyperlipidemia - Continue home rosuvastatin   CAD - Continue home Coreg, Imdur, rosuvastatin - Holding ARB     Anemia of chronic disease > Hemoglobin stable 10.0 - Trend CBC   Atrial fibrillation - Continue home Coreg -Resume Pradaxa as CBC stable on 11/11/2023   Pancytopenia > Follows outpatient.  Suspected myelodysplastic syndrome. Monitor.   Sleep disturbance Delirium - Continue melatonin, trazodone   Obesity - Noted   Hypomagnesemia.  Replace.    Recent prolonged hospitalization > Status post CIR stay, has reached goals. - Continue with PT as needed.   Diabetes - SSI  Lab Results  Component Value Date   HGBA1C 5.5 09/24/2023    CBG (last 3)  Recent Labs    11/12/23 1200 11/12/23 1642 11/12/23 2116  GLUCAP 139* 137* 101*        Condition - Fair  Family Communication  :  wife [name] [phone] on 11/12/2023  Code Status :  DNR  Consults  :  Renal  PUD Prophylaxis :    Procedures  :     CT - : 1. Mild bilateral hydronephrosis. No stone on either side. 2. Bilateral perinephric stranding, nonspecific. Correlation with urinalysis recommended. 3. Enlarged prostate gland. 4. Trace right pleural effusion.      Disposition Plan  :    Status  is: Inpatient  DVT Prophylaxis  :    Lab Results  Component Value Date   PLT 135 (L) 11/13/2023    Diet :  Diet Order             Diet heart healthy/carb modified Room service appropriate? Yes; Fluid consistency: Thin  Diet effective now                    Inpatient Medications  Scheduled Meds:  acetaminophen  650 mg Oral TID WC & HS   calcium carbonate  500 mg of elemental calcium Oral BID WC   carvedilol  6.25 mg Oral BID WC   cephALEXin  500 mg Oral Q8H   Chlorhexidine Gluconate Cloth  6 each Topical Daily   dabigatran  75 mg Oral Q12H   feeding supplement  1 Container Oral BID BM   folic acid  1 mg Oral Daily   hydrALAZINE  50 mg Oral Q8H   insulin aspart  0-9 Units Subcutaneous TID WC   isosorbide mononitrate  30 mg Oral Daily   melatonin  5 mg Oral QHS   rosuvastatin  20 mg Oral Daily   simethicone  80 mg Oral QID   sodium chloride flush  3 mL Intravenous Q12H   tamsulosin  0.4 mg Oral Daily   traZODone  50 mg Oral QHS   Continuous Infusions:   PRN Meds:.alum & mag hydroxide-simeth, bisacodyl, guaiFENesin-dextromethorphan, hydrALAZINE, hydrocortisone cream, lidocaine, ondansetron, mouth rinse, polyethylene glycol, sodium chloride, traMADol  Antibiotics  :    Anti-infectives (From admission, onward)    Start     Dose/Rate Route Frequency Ordered Stop   11/11/23 1400  cephALEXin (KEFLEX) capsule 500 mg        500 mg Oral Every 8 hours 11/11/23 1205 11/16/23 1359         Objective:  Vitals:   11/12/23 1643 11/12/23 1947 11/12/23 2311 11/13/23 0328  BP: (!) 143/74 (!) 145/67 (!) 140/69 (!) 150/76  Pulse:  63  77  Resp: (!) 24 20 (!) 21 (!) 29  Temp: 98.3 F (36.8 C) 98.7 F (37.1 C) 97.6 F (36.4 C) 98 F (36.7 C)  TempSrc: Oral Oral Oral Oral  SpO2: 95%   98%  Weight:        Wt Readings from Last 3 Encounters:  11/12/23 97.5 kg  10/31/23 102.7 kg  10/20/23 102.6 kg     Intake/Output Summary (Last 24 hours) at 11/13/2023 0819 Last  data filed at 11/13/2023 7829 Gross per 24 hour  Intake --  Output 3125 ml  Net -3125 ml     Physical Exam  Awake Alert, No new F.N deficits, Normal affect [place].AT,PERRAL Supple Neck, No JVD,   Symmetrical Chest wall movement, Good air movement bilaterally, CTAB RRR,No Gallops,Rubs or new Murmurs,  +ve B.Sounds, abd soft- foley   No Cyanosis, Clubbing or edema       Data Review:    Recent Labs  Lab 11/08/23 0535 11/10/23 1650 11/11/23 0503 11/12/23 0659 11/13/23 0545  WBC 3.4* 2.8* 4.1 5.1 6.8  HGB 10.0* 9.2* 10.3* 9.7* 9.6*  HCT 31.8* 29.6* 32.6* 30.6* 30.5*  PLT 126* 134* 123* 128* 135*  MCV 98.5 99.7 98.8 96.5 96.8  MCH 31.0 31.0 31.2 30.6 30.5  MCHC 31.4 31.1 31.6 31.7 31.5  RDW 19.7* 19.6* 19.2* 19.0* 18.9*  LYMPHSABS  --   --   --  0.6* 0.7  MONOABS  --   --   --  0.6 0.7  EOSABS  --   --   --  0.1 0.1  BASOSABS  --   --   --  0.0 0.0    Recent Labs  Lab 11/07/23 0040 11/08/23 0535 11/08/23 1931 11/09/23 0507 11/09/23 1037 11/10/23 0523 11/11/23 0906 11/12/23 0659 11/13/23 0545  NA 136 139   < > 138  --  139 138 135 134*  K 4.3 4.5   < > 3.9  --  4.3 5.0 4.2 3.8  CL 105 102   < > 102  --  105 107 103 104  CO2 22 17*   < > 19*  --  17* 15* 19* 19*  ANIONGAP 9 20*   < > 17*  --  17* 16* 13 11  GLUCOSE 104* 108*   < > 92  --  81 74 97 110*  BUN 32* 34*   < > 35*  --  38* 41* 33* 26*  CREATININE 1.51* 2.01*   < > 3.15*  --  3.51* 3.99* 2.60* 1.90*  AMMONIA  --   --   --   --  38*  --  25 33  --   BNP 268.1* 379.1*  --   --   --   --  422.3* 395.9*  --   MG  --   --   --   --   --   --   --  1.5* 2.0  PHOS  --   --   --   --   --   --  6.0* 3.6  --   CALCIUM 8.6* 9.8   < > 9.2  --  8.9 8.3* 8.2* 8.4*   < > = values in this interval not displayed.      Recent Labs  Lab 11/07/23 0040 11/08/23 0535 11/08/23 1931 11/09/23 0507  11/09/23 1037 11/10/23 0523 11/11/23 0906 11/12/23 0659 11/13/23 0545  AMMONIA  --   --   --   --  38*  --  25 33   --   BNP 268.1* 379.1*  --   --   --   --  422.3* 395.9*  --   MG  --   --   --   --   --   --   --  1.5* 2.0  CALCIUM 8.6* 9.8   < > 9.2  --  8.9 8.3* 8.2* 8.4*   < > = values in this interval not displayed.    --------------------------------------------------------------------------------------------------------------- Lab Results  Component Value Date   CHOL 119 04/29/2010   HDL 24.30 (L) 04/29/2010   LDLCALC 75 04/29/2010   TRIG 97.0 04/29/2010   CHOLHDL 5 04/29/2010    Lab Results  Component Value Date   HGBA1C 5.5 09/24/2023     Micro Results No results found for this or any previous visit (from the past 240 hours).   Radiology Report No results found.    Signature  -   Susa Raring M.D on 11/13/2023 at 8:19 AM   -  To page go to [email or web address]

## 2023-11-14 DIAGNOSIS — N179 Acute kidney failure, unspecified: Secondary | ICD-10-CM | POA: Diagnosis not present

## 2023-11-14 DIAGNOSIS — N1831 Chronic kidney disease, stage 3a: Secondary | ICD-10-CM | POA: Diagnosis not present

## 2023-11-14 LAB — CBC WITH DIFFERENTIAL/PLATELET
Abs Immature Granulocytes: 0.02 10*3/uL (ref 0.00–0.07)
Basophils Absolute: 0 10*3/uL (ref 0.0–0.1)
Basophils Relative: 0 %
Eosinophils Absolute: 0.2 10*3/uL (ref 0.0–0.5)
Eosinophils Relative: 3 %
HCT: 31.6 % — ABNORMAL LOW (ref 39.0–52.0)
Hemoglobin: 9.9 g/dL — ABNORMAL LOW (ref 13.0–17.0)
Immature Granulocytes: 0 %
Lymphocytes Relative: 12 %
Lymphs Abs: 0.8 10*3/uL (ref 0.7–4.0)
MCH: 30.4 pg (ref 26.0–34.0)
MCHC: 31.3 g/dL (ref 30.0–36.0)
MCV: 96.9 fL (ref 80.0–100.0)
Monocytes Absolute: 0.5 10*3/uL (ref 0.1–1.0)
Monocytes Relative: 8 %
Neutro Abs: 5.1 10*3/uL (ref 1.7–7.7)
Neutrophils Relative %: 77 %
Platelets: 147 10*3/uL — ABNORMAL LOW (ref 150–400)
RBC: 3.26 MIL/uL — ABNORMAL LOW (ref 4.22–5.81)
RDW: 18.5 % — ABNORMAL HIGH (ref 11.5–15.5)
WBC: 6.6 10*3/uL (ref 4.0–10.5)
nRBC: 0 % (ref 0.0–0.2)

## 2023-11-14 LAB — BASIC METABOLIC PANEL
Anion gap: 14 (ref 5–15)
BUN: 21 mg/dL (ref 8–23)
CO2: 20 mmol/L — ABNORMAL LOW (ref 22–32)
Calcium: 8.6 mg/dL — ABNORMAL LOW (ref 8.9–10.3)
Chloride: 104 mmol/L (ref 98–111)
Creatinine, Ser: 1.32 mg/dL — ABNORMAL HIGH (ref 0.61–1.24)
GFR, Estimated: 57 mL/min — ABNORMAL LOW (ref >60)
Glucose, Bld: 99 mg/dL (ref 70–99)
Potassium: 3.5 mmol/L (ref 3.5–5.1)
Sodium: 138 mmol/L (ref 135–145)

## 2023-11-14 LAB — GLUCOSE, CAPILLARY
Glucose-Capillary: 97 mg/dL (ref 70–99)
Glucose-Capillary: 134 mg/dL — ABNORMAL HIGH (ref 70–99)

## 2023-11-14 LAB — MAGNESIUM: Magnesium: 1.8 mg/dL (ref 1.7–2.4)

## 2023-11-14 MED ORDER — LOPERAMIDE HCL 2 MG PO CAPS: 2.0000 mg | ORAL_CAPSULE | Freq: Four times a day (QID) | ORAL | 0 refills | Status: DC | PRN

## 2023-11-14 MED ORDER — POTASSIUM CHLORIDE CRYS ER 20 MEQ PO TBCR
40.0000 meq | EXTENDED_RELEASE_TABLET | Freq: Once | ORAL | Status: AC
  Administered 2023-11-14: 40 meq via ORAL
  Filled 2023-11-14: qty 2

## 2023-11-14 MED ORDER — MAGNESIUM SULFATE 2 GM/50ML IV SOLN
2.0000 g | Freq: Once | INTRAVENOUS | Status: AC
Start: 2023-11-14 — End: 2023-11-14
  Administered 2023-11-14: 2 g via INTRAVENOUS
  Filled 2023-11-14: qty 50

## 2023-11-14 MED ORDER — LOPERAMIDE HCL 2 MG PO CAPS
2.0000 mg | ORAL_CAPSULE | Freq: Four times a day (QID) | ORAL | Status: DC | PRN
  Administered 2023-11-14 (×2): 2 mg via ORAL
  Filled 2023-11-14 (×3): qty 1

## 2023-11-14 MED ORDER — CEPHALEXIN 500 MG PO CAPS: 500.0000 mg | ORAL_CAPSULE | Freq: Three times a day (TID) | ORAL | 0 refills | Status: DC

## 2023-11-14 MED ORDER — PIOGLITAZONE HCL 15 MG PO TABS: 15.0000 mg | ORAL_TABLET | Freq: Every day | ORAL | Status: AC

## 2023-11-14 NOTE — TOC Transition Note (Signed)
Transition of Care [facility]) - Discharge Note   Patient Details  Name: Joshua [name] MRN: [id number] Date of Birth: Oct 09, 1950  Transition [facility]) CM/SW Contact:  Lawerance Sabal, RN Phone Number: 11/14/2023, 8:05 AM   Clinical Narrative:     Notified [facility] that patient will DC to home today. No DME needs identified. Anticipate family to provide transportation home.   Final next level of care: Home w Home Health Services Barriers to Discharge: No Barriers Identified   Patient Goals and CMS Choice            Discharge Placement                       Discharge Plan and Services Additional resources added to the After Visit Summary for     Discharge Planning Services: CM Consult            DME Arranged: N/A           HH Agency: [facility] Date [facility] Agency Contacted: 11/14/23 Time HH Agency Contacted: [phone] Representative spoke with at [facility] Agency: Laurelyn Sickle  Social Drivers of Health (SDOH) Interventions SDOH Screenings   Food Insecurity: No Food Insecurity (11/11/2023)  Housing: Low Risk  (11/11/2023)  Transportation Needs: No Transportation Needs (11/11/2023)  Utilities: Not At Risk (11/11/2023)  Social Connections: Moderately Integrated (11/11/2023)  Tobacco Use: Medium Risk (10/24/2023)     Readmission Risk Interventions    09/27/2023   10:04 AM  Readmission Risk Prevention Plan  Transportation Screening Complete  PCP or Specialist Appt within 5-7 Days Complete  Home Care Screening Complete  Medication Review (RN CM) Complete

## 2023-11-14 NOTE — Discharge Summary (Addendum)
Joshua [name] NWG:[id number] DOB: Mar 07, 1951 DOA: 11/10/2023  PCP: [name], MD  Admit date: 11/10/2023  Discharge date: 11/14/2023  Admitted From: [facility]   Disposition:  Home   Recommendations for Outpatient Follow-up:   Follow up with PCP in 1-2 weeks  PCP Please obtain BMP/CBC, 2 view CXR in 1week,  (see Discharge instructions)   PCP Please follow up on the following pending results:    Home Health: None   Equipment/Devices: None  Consultations: None   Discharge Condition: Stable     CODE STATUS: Full     Diet Recommendation: Heart Healthy Low Carb  Diet Order             Diet heart healthy/carb modified Room service appropriate? Yes; Fluid consistency: Thin  Diet effective now                    No chief complaint on file.    Brief history of present illness from the day of admission and additional interim summary     73 y.o. male with medical history significant of hypertension, hyperlipidemia, CAD, diabetes, anemia, CKD 3, atrial fibrillation, pancytopenia, sleep disturbance, delirium, obesity, status post laminectomy, GI bleed presenting with AKI from inpatient rehab.   Patient was admitted 12/27-1/26 with encephalopathy and GI bleed in the setting of increased INR.  Found to have AVMs treated with APC.  GI bleeding resolved, hemoglobin stabilized, patient was resumed on Pradaxa.  Also noted to have loculated effusion and had chest tube placed x 2 in consultation with pulmonology and CT surgery.  Received lytics x 2 and chest tube was taken out on 1/17.  Also treated for AKI that admission.   Ultimately discharged to inpatient rehab due to decline from prolonged admission and ongoing issues with delirium.  So developed lower abdominal pain and worsening renal failure for which he was  transferred to acute side of the hospital for further care, further workup revealed that he had bladder outlet obstruction causing renal failure.                                                                 Hospital Course   AKI on CKD 3.  Baseline creatinine around 1.5. On exam he had suprapubic fullness, Foley catheter was placed over 1500 cc of urine was obtained, Foley catheter was placed 11/11/2023, will continue Flomax, culture inconclusive, finished treatment with Keflex clinically no signs of active infection currently, outpatient urology follow-up, wife is setting up an appointment for next week with his urologist [name].  Renal function is now serially improving, nephrology signed off.  He is now symptom-free and close to baseline renal function has significantly improved, will be discharged home with Foley Flomax with outpatient urology follow-up.  ARB still on hold.  Hypertension - Continue home Coreg, hydralazine, Imdur add as needed hydralazine - Holding ARB as above   Hyperlipidemia - Continue home rosuvastatin   CAD - Continue home Coreg, Imdur, rosuvastatin - Holding ARB, PCP to monitor and adjust next visit start blood pressure and BMP.     Anemia of chronic disease > Hemoglobin stable 10.0 -Stable when accounted for heme dilution due to IV fluids   Atrial fibrillation - Continue home Coreg -Resumed Pradaxa as CBC stable on 11/11/2023, no acute issues   Pancytopenia > Follows outpatient.  Suspected myelodysplastic syndrome. Monitor.   Sleep disturbance Delirium -Completely resolved back to baseline   Obesity -With PCP for age-appropriate management   Hypomagnesemia.  Replaced.     Recent prolonged hospitalization > Generalized weakness, home health PT if he qualifies   Diabetes -New home regimen requested to check CBGs q. [facility].  Discharge diagnosis     Principal Problem:   Acute renal failure superimposed on stage 3a chronic kidney  disease, unspecified acute renal failure type (HCC) Active Problems:   Anemia of chronic disease   Type 2 diabetes mellitus (HCC)   Hyperlipidemia   Essential hypertension   Paroxysmal atrial fibrillation [facility])    Discharge instructions    Discharge Instructions     Discharge instructions   Complete by: As directed    Follow with Primary MD [name], MD in 7 days follow-up with your urologist within 5 to 7 days.  Get CBC, CMP, 2 view Chest X ray -  checked next visit with your primary MD    Activity: As tolerated with Full fall precautions use walker/cane & assistance as needed  Disposition Home    Diet: Heart Healthy low carbohydrate diet.  Check CBGs q. [facility]  Special Instructions: If you have smoked or chewed Tobacco  in the last 2 yrs please stop smoking, stop any regular Alcohol  and or any Recreational drug use.  On your next visit with your primary care physician please Get Medicines reviewed and adjusted.  Please request your Prim.MD to go over all Hospital Tests and Procedure/Radiological results at the follow up, please get all Hospital records sent to your Prim MD by signing hospital release before you go home.  If you experience worsening of your admission symptoms, develop shortness of breath, life threatening emergency, suicidal or homicidal thoughts you must seek medical attention immediately by calling 911 or calling your MD immediately  if symptoms less severe.  You Must read complete instructions/literature along with all the possible adverse reactions/side effects for all the Medicines you take and that have been prescribed to you. Take any new Medicines after you have completely understood and accpet all the possible adverse reactions/side effects.   Do not drive when taking Pain medications.  Do not take more than prescribed Pain, Sleep and Anxiety Medications  Wear Seat belts while driving.   Increase activity slowly   Complete by: As directed     No wound care   Complete by: As directed        Discharge Medications   Allergies as of 11/14/2023       Reactions   Desipramine Hives, Itching   Niacin Itching        Medication List     STOP taking these medications    olmesartan 40 MG tablet Commonly known as: BENICAR       TAKE these medications    carvedilol 12.5 MG tablet Commonly known as: COREG TAKE 1  TABLET BY MOUTH TWICE A DAY   fenofibrate 160 MG tablet Take 160 mg by mouth daily.   folic acid 1 MG tablet Commonly known as: FOLVITE Take 1 tablet (1 mg total) by mouth daily.   ipratropium 0.06 % nasal spray Commonly known as: ATROVENT Place 1 spray into both nostrils 3 (three) times daily as needed for rhinitis.   isosorbide mononitrate 30 MG 24 hr tablet Commonly known as: IMDUR Take 30 mg by mouth daily.   loperamide 2 MG capsule Commonly known as: IMODIUM Take 1 capsule (2 mg total) by mouth every 6 (six) hours as needed for diarrhea or loose stools.   nitroGLYCERIN 0.4 MG SL tablet Commonly known as: NITROSTAT Place 0.4 mg under the tongue every 5 (five) minutes x 3 doses as needed for chest pain.   pantoprazole 40 MG tablet Commonly known as: Protonix Take 1 tablet (40 mg total) by mouth 2 (two) times daily before a meal.   pioglitazone 15 MG tablet Commonly known as: ACTOS Take 1 tablet (15 mg total) by mouth daily. Start taking on: November 15, 2023   Pradaxa 150 MG Caps capsule Generic drug: dabigatran TAKE 1 CAPSULE BY MOUTH TWICE A DAY   rosuvastatin 20 MG tablet Commonly known as: CRESTOR Take 20 mg by mouth daily.   tamsulosin 0.4 MG Caps capsule Commonly known as: FLOMAX Take 1 capsule (0.4 mg total) by mouth daily.   Tylenol 8 Hour Arthritis Pain 650 MG CR tablet Generic drug: acetaminophen Take 650-1,300 mg by mouth every 8 (eight) hours as needed for pain.   vitamin B-12 500 MCG tablet Commonly known as: CYANOCOBALAMIN Take 500 mcg by mouth daily.          Follow-up Information     [facility] Follow [facility].   Specialty: Home Health Services Why: [facility] will contact you within 48 hours of DC to arrange a home health visit Contact information: [address] [place] [address] [phone]         [name], MD. Schedule an appointment as soon as possible for a visit in 1 week(s).   Specialty: Internal Medicine Contact information: [address] [place] [address] [phone]         Alfredo [name], MD. Schedule an appointment as soon as possible for a visit in 1 week(s).   Specialty: Urology Contact information: [address] [place] [address] [phone]                 Major procedures and Radiology Reports - PLEASE review detailed and final reports thoroughly  -      CT ABDOMEN PELVIS WO CONTRAST Result Date: 11/10/2023 CLINICAL DATA:  Right lower quadrant abdominal pain. EXAM: CT ABDOMEN AND PELVIS WITHOUT CONTRAST TECHNIQUE: Multidetector CT imaging of the abdomen and pelvis was performed following the standard protocol without IV contrast. RADIATION DOSE REDUCTION: This exam was performed according to the departmental dose-optimization program which includes automated exposure control, adjustment of the mA and/or kV according to patient size and/or use of iterative reconstruction technique. COMPARISON:  CT abdomen pelvis dated 04/27/2014. FINDINGS: Evaluation of this exam is limited in the absence of intravenous contrast. Lower chest: Trace right pleural effusion. There is associated minimal compressive atelectasis of the right lung base. Three vessel coronary vascular calcification. No intra-abdominal free air.  Trace free fluid in the lower abdomen. Hepatobiliary: Small cyst in the anterior liver. No biliary dilatation. No calcified gallstone. Probable sludge or  noncalcified stone in the gallbladder. No pericholecystic fluid or evidence of acute cholecystitis by CT.  Pancreas: Unremarkable. No pancreatic ductal dilatation or surrounding inflammatory changes. Spleen: Normal in size without focal abnormality. Adrenals/Urinary Tract: The adrenal glands are unremarkable. Mild bilateral hydronephrosis. There is no stone on either side. Left renal inferior pole cyst measures 12 cm. Additional smaller cyst noted in the inferior pole of the left kidney. Indeterminate 9 mm higher attenuating lesion from the upper pole of the left kidney may represent a complex/proteinaceous cyst. The visualized ureters appear unremarkable. The urinary bladder is distended and grossly unremarkable. Stomach/Bowel: There is no bowel obstruction or active inflammation. The appendix is normal. Vascular/Lymphatic: Moderate aortoiliac atherosclerotic disease. The IVC is unremarkable. No portal venous gas. There is no adenopathy. Reproductive: Enlarged prostate gland measuring 6.5 cm in transverse axial diameter. The seminal vesicles are symmetric. Other: There is bilateral perinephric stranding extending down along the paracolic gutters and adjacent to the urinary bladder. This is nonspecific and may be fluid related to renal cyst or represent underlying inflammatory/infectious process. Correlation with urinalysis recommended. Musculoskeletal: Osteopenia.  No acute osseous pathology. IMPRESSION: 1. Mild bilateral hydronephrosis. No stone on either side. 2. Bilateral perinephric stranding, nonspecific. Correlation with urinalysis recommended. 3. Enlarged prostate gland. 4. Trace right pleural effusion. Electronically Signed   By: Elgie Collard M.D.   On: 11/10/2023 12:05   US Abdomen Complete Result Date: 11/09/2023 CLINICAL DATA:  Abdominal pain EXAM: ABDOMEN ULTRASOUND COMPLETE COMPARISON:  Right upper quadrant ultrasound 10/06/2023 and older. Renal ultrasound 11/02/2023. FINDINGS: Gallbladder: Gallbladder is mildly distended but there is significant gallstones. No wall thickening or adjacent fluid.  Common bile duct: Diameter: 5 mm Liver: No focal lesion identified. Within normal limits in parenchymal echogenicity. Portal vein is patent on color Doppler imaging with normal direction of blood flow towards the liver. IVC: No abnormality visualized. Pancreas: Poorly seen with overlapping bowel gas and soft tissue. Spleen: Enlarged at 13.6 cm Right Kidney: Length: 12.1 cm. No collecting system dilatation. There is a small anechoic structure in the right kidney measuring 13 mm consistent with a benign cyst. Left Kidney: Length: 12.1 cm in length. No collecting system dilatation with the left kidney is poorly seen with overlapping bowel gas and soft tissue. Dominant cyst identified once again measuring up to 11.4 cm. Abdominal aorta: Overall poorly seen. Other findings: Extremely limited examination for several structures with the overlapping bowel gas and soft tissue. If needed additional cross-sectional imaging study such as CT could be considered as clinically appropriate pleural effusion. IMPRESSION: Gallstones again seen.  No ductal dilatation. Splenomegaly. Renal cysts. Several structures are poorly seen with overlapping bowel gas and soft tissue. If there is further concern additional cross-sectional imaging study such as CT may be useful in this situation for further delineation. Electronically Signed   By: Karen Kays M.D.   On: 11/09/2023 17:32   DG Abd 1 View Result Date: 11/07/2023 CLINICAL DATA:  Abdominal pain EXAM: ABDOMEN - 1 VIEW COMPARISON:  10/25/2023 FINDINGS: Three supine frontal views of the abdomen and pelvis are obtained. No bowel obstruction or ileus. Moderate gaseous distention of the stomach. No masses or abnormal calcifications. Patchy right basilar consolidation and small right effusion. No acute bony abnormalities. IMPRESSION: 1. No bowel obstruction or ileus. 2. Patchy right basilar consolidation and small right effusion. Electronically Signed   By: Sharlet Salina M.D.   On:  11/07/2023 17:29   US RENAL Result Date: 11/02/2023 CLINICAL DATA:  Hematuria EXAM: RENAL /  URINARY TRACT ULTRASOUND COMPLETE COMPARISON:  None Available. FINDINGS: Right Kidney: Renal measurements: 12.0 x 6.3 x 5.2 cm = volume: 204 mL. Echogenicity within normal limits. No mass or hydronephrosis visualized. Left Kidney: Renal measurements: 12.3 x 6.3 x 6.0 cm = volume: 242 mL. Fall 1.5 cm shadowing calculus in the lower renal pelvis. There is a interpolar cyst measuring 2.1 x 1.8 x 1.7 cm. Lower pole cyst measures 9.9 x 5.9 x 10.3 cm. Bladder: Solid-appearing region within the urinary bladder wall measures 1.3 x 1.1 x 1.4 cm. No blood flow demonstrated with color Doppler. Other: None. IMPRESSION: 1. Solid-appearing region within the urinary bladder wall measures 1.3 x 1.1 x 1.4 cm. This could be blood clot or a mass, though no vascularity was demonstrated. Cystoscopy would be helpful to differentiate. 2. Nonobstructing 1.5 cm calculus in the lower left renal pelvis. 3. Left renal cysts. Electronically Signed   By: Deatra Robinson M.D.   On: 11/02/2023 20:13   DG Chest 2 View Result Date: 10/28/2023 CLINICAL DATA:  [id number] Pleural effusion [id number] EXAM: CHEST - 2 VIEW COMPARISON:  Chest x-ray 10/18/2023 FINDINGS: The heart and mediastinal contours are unchanged. Coronary artery stent. Low lung volumes. No focal consolidation. No pulmonary edema. No pleural effusion. No pneumothorax. No acute osseous abnormality. IMPRESSION: Low lung volumes with no active cardiopulmonary disease. Electronically Signed   By: Tish Frederickson M.D.   On: 10/28/2023 18:53   DG Abd 1 View Result Date: 10/25/2023 CLINICAL DATA:  Abdominal pain EXAM: ABDOMEN - 1 VIEW COMPARISON:  None Available. FINDINGS: Scattered large and small bowel gas is noted. No obstructive changes are seen. No free air is noted. Mild degenerative changes of lumbar spine are seen. IMPRESSION: No acute abnormality noted. Electronically Signed   By: Alcide Clever  M.D.   On: 10/25/2023 21:13   CT HEAD WO CONTRAST ( ) Result Date: 10/18/2023 CLINICAL DATA:  Initial evaluation for mental status change, unknown cause. EXAM: CT HEAD WITHOUT CONTRAST TECHNIQUE: Contiguous axial images were obtained from the base of the skull through the vertex without intravenous contrast. RADIATION DOSE REDUCTION: This exam was performed according to the departmental dose-optimization program which includes automated exposure control, adjustment of the mA and/or kV according to patient size and/or use of iterative reconstruction technique. COMPARISON:  Prior CT from 10/08/2023. FINDINGS: Brain: Mild age-related cerebral atrophy with chronic small vessel ischemic disease. No acute intracranial hemorrhage. No acute large vessel territory infarct. No mass lesion, midline shift or mass effect. No hydrocephalus or extra-axial fluid collection. Vascular: No abnormal hyperdense vessel. Calcified atherosclerosis present at the skull base. Skull: Scalp soft tissues within normal limits.  Calvarium intact. Sinuses/Orbits: Globes and orbital soft tissues within normal limits. Paranasal sinuses are clear. No mastoid effusion. Other: None. IMPRESSION: 1. No acute intracranial abnormality. 2. Mild age-related cerebral atrophy with chronic small vessel ischemic disease. Electronically Signed   By: Rise Mu M.D.   On: 10/18/2023 20:18   DG CHEST PORT 1 VIEW Result Date: 10/18/2023 CLINICAL DATA:  Fever. EXAM: PORTABLE CHEST 1 VIEW COMPARISON:  Chest x-ray dated October 16, 2023. FINDINGS: Stable cardiomediastinal silhouette. Normal pulmonary vascularity. Unchanged small loculated right pleural effusion with fluid tracking along the upper mediastinum and minor fissure. Similar right hemithorax volume loss with elevation of the right hemidiaphragm. No pneumothorax. No acute osseous abnormality. IMPRESSION: 1. Unchanged small loculated right pleural effusion. Electronically Signed   By: Obie Dredge M.D.   On: 10/18/2023 11:49   EEG adult Result  Date: 10/18/2023 Charlsie Quest, MD     10/18/2023 11:48 AM Patient Name: HARPER SMOKER MRN: [id number] Epilepsy Attending: Charlsie Quest Referring Physician/Provider: Leatha Gilding, MD Date: 10/18/2023 Duration: 25.28 mins Patient history: 73yo M with ams getting eeg to evaluate for seizure Level of alertness: Awake, asleep AEDs during EEG study: None Technical aspects: This EEG study was done with scalp electrodes positioned according to the 10-20 International system of electrode placement. Electrical activity was reviewed with band pass filter of 1-70Hz , sensitivity of 7 uV/mm, display speed of 9mm/sec with a 60Hz  notched filter applied as appropriate. EEG data were recorded continuously and digitally stored.  Video monitoring was available and reviewed as appropriate. Description: The posterior dominant rhythm consists of 8 Hz activity of moderate voltage (25-35 uV) seen predominantly in posterior head regions, symmetric and reactive to eye opening and eye closing. Sleep was characterized by vertex waves, sleep spindles (12 to 14 Hz), maximal frontocentral region. Physiologic photic driving was not seen during photic stimulation. Hyperventilation was not performed.   IMPRESSION: This study is within normal limits. No seizures or epileptiform discharges were seen throughout the recording. A normal interictal EEG does not exclude the diagnosis of epilepsy. Charlsie Quest   DG Chest Port 1 View Result Date: 10/16/2023 CLINICAL DATA:  73 year old male with history of pleural effusion. EXAM: PORTABLE CHEST 1 VIEW COMPARISON:  Chest x-ray 10/15/2023. FINDINGS: Previously noted small bore right-sided chest tube has been removed. Elevation of the right hemidiaphragm again noted. Lung volumes are normal. No confluent consolidative airspace disease. Small right pleural effusion, some of which tracks along the right chest wall and into the minor  fissure, similar to the prior study. No left pleural effusion. No pneumothorax. No evidence of pulmonary edema. Heart size appears borderline enlarged. Prominence of the right paratracheal contour, similar to the prior study, likely reflecting residual loculated pleural fluid. IMPRESSION: 1. Interval removal of right-sided chest tube with no evidence of pneumothorax. 2. Persistent small right pleural effusion, some of which appears loculated, as above. 3. Borderline cardiomegaly. Electronically Signed   By: Trudie Reed M.D.   On: 10/16/2023 08:04   DG CHEST PORT 1 VIEW Result Date: 10/15/2023 CLINICAL DATA:  Pleural effusion EXAM: PORTABLE CHEST 1 VIEW COMPARISON:  10/08/2023, CT 10/10/2023 FINDINGS: Left lung is grossly clear. Cardiomegaly. Small right-sided pleural effusion with loculation. Stable positioning of right lung base chest tube. No appreciable pneumothorax on this exam IMPRESSION: Stable positioning of right lung base chest tube. No appreciable pneumothorax on this exam. Small right-sided pleural effusion with loculation without great interval change compared to prior radiograph. Electronically Signed   By: Jasmine Pang M.D.   On: 10/15/2023 19:10    Micro Results    No results found for this or any previous visit (from the past 240 hours).  Today   Subjective    Joshua [name] today has no headache,no chest abdominal pain,no new weakness tingling or numbness, feels much better wants to go home today.    Objective   Blood pressure (!) 154/65, pulse 77, temperature 98.4 F (36.9 C), temperature source Oral, resp. rate 20, weight 97.5 kg, SpO2 98%.   Intake/Output Summary (Last 24 hours) at 11/14/2023 0947 Last data filed at 11/14/2023 0540 Gross per 24 hour  Intake 123 ml  Output 3150 ml  Net -3027 ml    Exam  Awake Alert, No new F.N deficits,    [place].AT,PERRAL Supple Neck,   Symmetrical Chest wall movement, Good  air movement bilaterally, CTAB RRR,No Gallops,   +ve  B.Sounds, Abd Soft, Non tender,  No Cyanosis, Clubbing or edema , foley   Data Review   Recent Labs  Lab 11/10/23 1650 11/11/23 0503 11/12/23 0659 11/13/23 0545 11/14/23 0557  WBC 2.8* 4.1 5.1 6.8 6.6  HGB 9.2* 10.3* 9.7* 9.6* 9.9*  HCT 29.6* 32.6* 30.6* 30.5* 31.6*  PLT 134* 123* 128* 135* 147*  MCV 99.7 98.8 96.5 96.8 96.9  MCH 31.0 31.2 30.6 30.5 30.4  MCHC 31.1 31.6 31.7 31.5 31.3  RDW 19.6* 19.2* 19.0* 18.9* 18.5*  LYMPHSABS  --   --  0.6* 0.7 0.8  MONOABS  --   --  0.6 0.7 0.5  EOSABS  --   --  0.1 0.1 0.2  BASOSABS  --   --  0.0 0.0 0.0    Recent Labs  Lab 11/08/23 0535 11/08/23 1931 11/09/23 1037 11/10/23 0523 11/11/23 0906 11/12/23 0659 11/13/23 0545 11/14/23 0557  NA 139   < >  --  139 138 135 134* 138  K 4.5   < >  --  4.3 5.0 4.2 3.8 3.5  CL 102   < >  --  105 107 103 104 104  CO2 17*   < >  --  17* 15* 19* 19* 20*  ANIONGAP 20*   < >  --  17* 16* 13 11 14   GLUCOSE 108*   < >  --  81 74 97 110* 99  BUN 34*   < >  --  38* 41* 33* 26* 21  CREATININE 2.01*   < >  --  3.51* 3.99* 2.60* 1.90* 1.32*  AMMONIA  --   --  38*  --  25 33  --   --   BNP 379.1*  --   --   --  422.3* 395.9*  --   --   MG  --   --   --   --   --  1.5* 2.0 1.8  PHOS  --   --   --   --  6.0* 3.6  --   --   CALCIUM 9.8   < >  --  8.9 8.3* 8.2* 8.4* 8.6*   < > = values in this interval not displayed.    Total Time in preparing paper work, data evaluation and todays exam - 35 minutes  Signature  -    Susa Raring M.D on 11/14/2023 at 9:47 AM   -  To page go to [email or web address]

## 2023-11-14 NOTE — Plan of Care (Signed)

## 2023-11-14 NOTE — Discharge Instructions (Signed)
Follow with Primary MD [name], MD in 7 days follow-up with your urologist within 5 to 7 days.  Get CBC, CMP, 2 view Chest X ray -  checked next visit with your primary MD    Activity: As tolerated with Full fall precautions use walker/cane & assistance as needed  Disposition Home    Diet: Heart Healthy low carbohydrate diet.  Check CBGs q. [facility]  Special Instructions: If you have smoked or chewed Tobacco  in the last 2 yrs please stop smoking, stop any regular Alcohol  and or any Recreational drug use.  On your next visit with your primary care physician please Get Medicines reviewed and adjusted.  Please request your Prim.MD to go over all Hospital Tests and Procedure/Radiological results at the follow up, please get all Hospital records sent to your Prim MD by signing hospital release before you go home.  If you experience worsening of your admission symptoms, develop shortness of breath, life threatening emergency, suicidal or homicidal thoughts you must seek medical attention immediately by calling 911 or calling your MD immediately  if symptoms less severe.  You Must read complete instructions/literature along with all the possible adverse reactions/side effects for all the Medicines you take and that have been prescribed to you. Take any new Medicines after you have completely understood and accpet all the possible adverse reactions/side effects.   Do not drive when taking Pain medications.  Do not take more than prescribed Pain, Sleep and Anxiety Medications  Wear Seat belts while driving.

## 2023-11-16 DIAGNOSIS — N401 Enlarged prostate with lower urinary tract symptoms: Secondary | ICD-10-CM | POA: Diagnosis not present

## 2023-11-16 DIAGNOSIS — E1151 Type 2 diabetes mellitus with diabetic peripheral angiopathy without gangrene: Secondary | ICD-10-CM | POA: Diagnosis not present

## 2023-11-16 DIAGNOSIS — G4700 Insomnia, unspecified: Secondary | ICD-10-CM | POA: Diagnosis not present

## 2023-11-16 DIAGNOSIS — I10 Essential (primary) hypertension: Secondary | ICD-10-CM | POA: Diagnosis not present

## 2023-11-16 DIAGNOSIS — D649 Anemia, unspecified: Secondary | ICD-10-CM | POA: Diagnosis not present

## 2023-11-16 DIAGNOSIS — K922 Gastrointestinal hemorrhage, unspecified: Secondary | ICD-10-CM | POA: Diagnosis not present

## 2023-11-16 DIAGNOSIS — R338 Other retention of urine: Secondary | ICD-10-CM | POA: Diagnosis not present

## 2023-11-16 DIAGNOSIS — D61818 Other pancytopenia: Secondary | ICD-10-CM | POA: Diagnosis not present

## 2023-11-16 DIAGNOSIS — N281 Cyst of kidney, acquired: Secondary | ICD-10-CM | POA: Diagnosis not present

## 2023-11-16 DIAGNOSIS — N179 Acute kidney failure, unspecified: Secondary | ICD-10-CM | POA: Diagnosis not present

## 2023-11-16 DIAGNOSIS — S81812A Laceration without foreign body, left lower leg, initial encounter: Secondary | ICD-10-CM | POA: Diagnosis not present

## 2023-11-16 DIAGNOSIS — I872 Venous insufficiency (chronic) (peripheral): Secondary | ICD-10-CM | POA: Diagnosis not present

## 2023-11-16 DIAGNOSIS — R410 Disorientation, unspecified: Secondary | ICD-10-CM | POA: Diagnosis not present

## 2023-11-16 DIAGNOSIS — E785 Hyperlipidemia, unspecified: Secondary | ICD-10-CM | POA: Diagnosis not present

## 2023-11-16 DIAGNOSIS — I480 Paroxysmal atrial fibrillation: Secondary | ICD-10-CM | POA: Diagnosis not present

## 2023-11-29 DIAGNOSIS — R338 Other retention of urine: Secondary | ICD-10-CM | POA: Diagnosis not present

## 2023-11-29 DIAGNOSIS — N401 Enlarged prostate with lower urinary tract symptoms: Secondary | ICD-10-CM | POA: Diagnosis not present

## 2023-12-03 ENCOUNTER — Ambulatory Visit (HOSPITAL_COMMUNITY): Payer: Medicare HMO

## 2023-12-14 DIAGNOSIS — E1151 Type 2 diabetes mellitus with diabetic peripheral angiopathy without gangrene: Secondary | ICD-10-CM | POA: Diagnosis not present

## 2023-12-14 DIAGNOSIS — I10 Essential (primary) hypertension: Secondary | ICD-10-CM | POA: Diagnosis not present

## 2023-12-14 DIAGNOSIS — S81812A Laceration without foreign body, left lower leg, initial encounter: Secondary | ICD-10-CM | POA: Diagnosis not present

## 2023-12-14 DIAGNOSIS — G4700 Insomnia, unspecified: Secondary | ICD-10-CM | POA: Diagnosis not present

## 2023-12-14 DIAGNOSIS — K922 Gastrointestinal hemorrhage, unspecified: Secondary | ICD-10-CM | POA: Diagnosis not present

## 2023-12-14 DIAGNOSIS — I872 Venous insufficiency (chronic) (peripheral): Secondary | ICD-10-CM | POA: Diagnosis not present

## 2023-12-14 DIAGNOSIS — N179 Acute kidney failure, unspecified: Secondary | ICD-10-CM | POA: Diagnosis not present

## 2023-12-14 DIAGNOSIS — D61818 Other pancytopenia: Secondary | ICD-10-CM | POA: Diagnosis not present

## 2023-12-14 DIAGNOSIS — E785 Hyperlipidemia, unspecified: Secondary | ICD-10-CM | POA: Diagnosis not present

## 2023-12-14 DIAGNOSIS — I480 Paroxysmal atrial fibrillation: Secondary | ICD-10-CM | POA: Diagnosis not present

## 2023-12-14 DIAGNOSIS — D649 Anemia, unspecified: Secondary | ICD-10-CM | POA: Diagnosis not present

## 2023-12-28 ENCOUNTER — Ambulatory Visit (HOSPITAL_COMMUNITY)

## 2023-12-30 DIAGNOSIS — I87332 Chronic venous hypertension (idiopathic) with ulcer and inflammation of left lower extremity: Secondary | ICD-10-CM | POA: Diagnosis not present

## 2023-12-30 DIAGNOSIS — I1310 Hypertensive heart and chronic kidney disease without heart failure, with stage 1 through stage 4 chronic kidney disease, or unspecified chronic kidney disease: Secondary | ICD-10-CM | POA: Diagnosis not present

## 2023-12-30 DIAGNOSIS — I739 Peripheral vascular disease, unspecified: Secondary | ICD-10-CM | POA: Diagnosis not present

## 2023-12-30 DIAGNOSIS — I480 Paroxysmal atrial fibrillation: Secondary | ICD-10-CM | POA: Diagnosis not present

## 2023-12-30 DIAGNOSIS — I872 Venous insufficiency (chronic) (peripheral): Secondary | ICD-10-CM | POA: Diagnosis not present

## 2024-01-04 ENCOUNTER — Inpatient Hospital Stay: Payer: Medicare HMO | Admitting: Pulmonary Disease

## 2024-01-04 DIAGNOSIS — R338 Other retention of urine: Secondary | ICD-10-CM | POA: Diagnosis not present

## 2024-01-06 DIAGNOSIS — I872 Venous insufficiency (chronic) (peripheral): Secondary | ICD-10-CM | POA: Diagnosis not present

## 2024-01-06 DIAGNOSIS — I87332 Chronic venous hypertension (idiopathic) with ulcer and inflammation of left lower extremity: Secondary | ICD-10-CM | POA: Diagnosis not present

## 2024-01-06 DIAGNOSIS — I1310 Hypertensive heart and chronic kidney disease without heart failure, with stage 1 through stage 4 chronic kidney disease, or unspecified chronic kidney disease: Secondary | ICD-10-CM | POA: Diagnosis not present

## 2024-01-06 DIAGNOSIS — I480 Paroxysmal atrial fibrillation: Secondary | ICD-10-CM | POA: Diagnosis not present

## 2024-01-06 DIAGNOSIS — I739 Peripheral vascular disease, unspecified: Secondary | ICD-10-CM | POA: Diagnosis not present

## 2024-01-06 DIAGNOSIS — N400 Enlarged prostate without lower urinary tract symptoms: Secondary | ICD-10-CM | POA: Diagnosis not present

## 2024-01-13 DIAGNOSIS — I739 Peripheral vascular disease, unspecified: Secondary | ICD-10-CM | POA: Diagnosis not present

## 2024-01-13 DIAGNOSIS — I872 Venous insufficiency (chronic) (peripheral): Secondary | ICD-10-CM | POA: Diagnosis not present

## 2024-01-13 DIAGNOSIS — I87332 Chronic venous hypertension (idiopathic) with ulcer and inflammation of left lower extremity: Secondary | ICD-10-CM | POA: Diagnosis not present

## 2024-01-24 DIAGNOSIS — R338 Other retention of urine: Secondary | ICD-10-CM | POA: Diagnosis not present

## 2024-02-02 DIAGNOSIS — N312 Flaccid neuropathic bladder, not elsewhere classified: Secondary | ICD-10-CM | POA: Diagnosis not present

## 2024-02-02 DIAGNOSIS — R338 Other retention of urine: Secondary | ICD-10-CM | POA: Diagnosis not present

## 2024-02-02 DIAGNOSIS — N401 Enlarged prostate with lower urinary tract symptoms: Secondary | ICD-10-CM | POA: Diagnosis not present

## 2024-03-16 ENCOUNTER — Other Ambulatory Visit: Payer: Self-pay | Admitting: Hematology and Oncology

## 2024-03-16 DIAGNOSIS — D61818 Other pancytopenia: Secondary | ICD-10-CM

## 2024-03-16 NOTE — Progress Notes (Signed)
 [facility] [phone]   [phone]  PROGRESS NOTE  Patient Care Team: Bertha Broad, MD as PCP - General (Internal Medicine) Tammie Fall, MD as PCP - Cardiology (Cardiology) Bertha Broad, MD (Internal Medicine)  Hematological/Oncological History # Pancytopenia, Suspect Myelodysplasia 05/12/2022: WBC 3.09, Hgb 11.6, MCV 95.5, Plt 103 06/15/2022: establish care with Dr. Rosaline Coma.  No clear etiology based off nutritional panel. 06/17/2022: Abdominal ultrasound performed, no problems with the liver or spleen. 08/07/2022: Bone marrow biopsy performed, showed no clear abnormalities within the bone marrow.   Interval History:  Joshua [name] 73 y.o. male with medical history significant for pancytopenia who presents for a follow up visit. The patient's last visit was on 03/18/2023. In the interim since the last visit he has had no major changes in his health.  On exam today Joshua [name] ***   MEDICAL HISTORY:  Past Medical History:  Diagnosis Date   Arthritis    Atrial fibrillation (HCC)    Atrial flutter (HCC)    Back pain    CAD (coronary artery disease)    stents placed 2009   Cholelithiasis    Complication of anesthesia    woke up during surgery and ablation   DM (diabetes mellitus) (HCC)    type 2   Elevated PSA    Gout    Hearing problem    HTN (hypertension)    Hyperlipidemia    Myocardial infarct, old 2010   Prostatitis    Renal insufficiency     SURGICAL HISTORY: Past Surgical History:  Procedure Laterality Date   CARDIAC CATHETERIZATION  11/07/07   with 2 stents   COLONOSCOPY     Electrophysiologic study and RF catheter ablation   of AV node reentrant tachycardia.     ESOPHAGOGASTRODUODENOSCOPY (EGD) WITH PROPOFOL  N/A 09/30/2023   Procedure: ESOPHAGOGASTRODUODENOSCOPY (EGD) WITH PROPOFOL ;  Surgeon: Lajuan Pila, MD;  Location: [facility];  Service: Gastroenterology;  Laterality: N/A;   EXTERNAL EAR SURGERY Right     cartilage   HOT HEMOSTASIS N/A 09/30/2023   Procedure: HOT HEMOSTASIS (ARGON PLASMA COAGULATION/BICAP);  Surgeon: Lajuan Pila, MD;  Location: [facility];  Service: Gastroenterology;  Laterality: N/A;   LEFT AND RIGHT HEART CATHETERIZATION WITH CORONARY/GRAFT ANGIOGRAM N/A 09/27/2014   Procedure: LEFT AND RIGHT HEART CATHETERIZATION WITH Estella Helling;  Surgeon: Mickiel Albany, MD;  Location: [facility] C[facility] LAB;  Service: Cardiovascular;  Laterality: N/A;   LUMBAR LAMINECTOMY/DECOMPRESSION MICRODISCECTOMY Left 04/29/2018   Procedure: Left Lumbar Four-Five Laminectomy/Foraminotomy;  Surgeon: Isadora Mar, MD;  Location: [facility] OR;  Service: Neurosurgery;  Laterality: Left;  Left Lumbar Four-Five Laminectomy/Foraminotomy   TOOTH EXTRACTION     VASECTOMY      SOCIAL HISTORY: Social History   Socioeconomic History   Marital status: Married    Spouse name: Not on file   Number of children: 2   Years of education: Not on file   Highest education level: Not on file  Occupational History   Occupation: SERV TECH    Employer: [employer]  Tobacco Use   Smoking status: Never   Smokeless tobacco: Former    Types: Chew  Substance and Sexual Activity   Alcohol  use: No   Drug use: No   Sexual activity: Not on file  Other Topics Concern   Not on file  Social History Narrative   Not on file   Social Drivers of Health   Financial Resource Strain: Not on file  Food Insecurity: No Food  Insecurity (11/11/2023)   Hunger Vital Sign    Worried About Running Out of Food in the Last Year: Never true    Ran Out of Food in the Last Year: Never true  Transportation Needs: No Transportation Needs (11/11/2023)   PRAPARE - Administrator, Civil Service (Medical): No    Lack of Transportation (Non-Medical): No  Physical Activity: Not on file  Stress: Not on file  Social Connections: Moderately Integrated (11/11/2023)   Social Connection and Isolation Panel    Frequency of  Communication with Friends and Family: More than three times a week    Frequency of Social Gatherings with Friends and Family: More than three times a week    Attends Religious Services: 1 to 4 times per year    Active Member of [employer] or Organizations: No    Attends Banker Meetings: Never    Marital Status: Married  Catering manager Violence: Not At Risk (11/11/2023)   Humiliation, Afraid, Rape, and Kick questionnaire    Fear of Current or Ex-Partner: No    Emotionally Abused: No    Physically Abused: No    Sexually Abused: No    FAMILY HISTORY: Family History  Problem Relation Age of Onset   Hypertension Mother    Gout Mother    Alzheimer's disease Mother    Heart failure Father    Heart disease Brother    Cancer Brother        type unknown    ALLERGIES:  is allergic to desipramine  and niacin.  MEDICATIONS:  Current Outpatient Medications  Medication Sig Dispense Refill   carvedilol  (COREG ) 12.5 MG tablet TAKE 1 TABLET BY MOUTH TWICE A DAY 180 tablet 3   fenofibrate 160 MG tablet Take 160 mg by mouth daily.     folic acid  (FOLVITE ) 1 MG tablet Take 1 tablet (1 mg total) by mouth daily.     ipratropium (ATROVENT) 0.06 % nasal spray Place 1 spray into both nostrils 3 (three) times daily as needed for rhinitis.     isosorbide  mononitrate (IMDUR ) 30 MG 24 hr tablet Take 30 mg by mouth daily.     loperamide  (IMODIUM ) 2 MG capsule Take 1 capsule (2 mg total) by mouth every 6 (six) hours as needed for diarrhea or loose stools. 15 capsule 0   nitroGLYCERIN  (NITROSTAT ) 0.4 MG SL tablet Place 0.4 mg under the tongue every 5 (five) minutes x 3 doses as needed for chest pain.     pantoprazole  (PROTONIX ) 40 MG tablet Take 1 tablet (40 mg total) by mouth 2 (two) times daily before a meal.     pioglitazone  (ACTOS ) 15 MG tablet Take 1 tablet (15 mg total) by mouth daily.     PRADAXA  150 MG CAPS capsule TAKE 1 CAPSULE BY MOUTH TWICE A DAY 60 capsule 5   rosuvastatin  (CRESTOR ) 20  MG tablet Take 20 mg by mouth daily.     tamsulosin  (FLOMAX ) 0.4 MG CAPS capsule Take 1 capsule (0.4 mg total) by mouth daily.     TYLENOL  8 HOUR ARTHRITIS PAIN 650 MG CR tablet Take 650-1,300 mg by mouth every 8 (eight) hours as needed for pain.     vitamin B-12 (CYANOCOBALAMIN ) 500 MCG tablet Take 500 mcg by mouth daily.     No current facility-administered medications for this visit.    REVIEW OF SYSTEMS:   Constitutional: ( - ) fevers, ( - )  chills , ( - ) night sweats Eyes: ( - )  blurriness of vision, ( - ) double vision, ( - ) watery eyes Ears, nose, mouth, throat, and face: ( - ) mucositis, ( - ) sore throat Respiratory: ( - ) cough, ( - ) dyspnea, ( - ) wheezes Cardiovascular: ( - ) palpitation, ( - ) chest discomfort, ( - ) lower extremity swelling Gastrointestinal:  ( - ) nausea, ( - ) heartburn, ( - ) change in bowel habits Skin: ( - ) abnormal skin rashes Lymphatics: ( - ) new lymphadenopathy, ( - ) easy bruising Neurological: ( - ) numbness, ( - ) tingling, ( - ) new weaknesses Behavioral/Psych: ( - ) mood change, ( - ) new changes  All other systems were reviewed with the patient and are negative.  PHYSICAL EXAMINATION: There were no vitals filed for this visit.   There were no vitals filed for this visit.    GENERAL: Well-appearing elderly Caucasian male, alert, no distress and comfortable SKIN: skin color, texture, turgor are normal, no rashes or significant lesions EYES: conjunctiva are pink and non-injected, sclera clear LUNGS: clear to auscultation and percussion with normal breathing effort HEART: regular rate & rhythm and no murmurs and no lower extremity edema Musculoskeletal: no cyanosis of digits and no clubbing  PSYCH: alert & oriented x 3, fluent speech NEURO: no focal motor/sensory deficits  LABORATORY DATA:  I have reviewed the data as listed    Latest Ref Rng & Units 11/14/2023    5:57 AM 11/13/2023    5:45 AM 11/12/2023    6:59 AM  CBC  WBC  4.0 - 10.5 K/uL 6.6  6.8  5.1   Hemoglobin 13.0 - 17.0 g/dL 9.9  9.6  9.7   Hematocrit 39.0 - 52.0 % 31.6  30.5  30.6   Platelets 150 - 400 K/uL 147  135  128        Latest Ref Rng & Units 11/14/2023    5:57 AM 11/13/2023    5:45 AM 11/12/2023    6:59 AM  CMP  Glucose 70 - 99 mg/dL 99  161  97   BUN 8 - 23 mg/dL 21  26  33   Creatinine 0.61 - 1.24 mg/dL 0.96  0.45  4.09   Sodium 135 - 145 mmol/L 138  134  135   Potassium 3.5 - 5.1 mmol/L 3.5  3.8  4.2   Chloride 98 - 111 mmol/L 104  104  103   CO2 22 - 32 mmol/L 20  19  19    Calcium  8.9 - 10.3 mg/dL 8.6  8.4  8.2     Lab Results  Component Value Date   MPROTEIN Not Observed 06/15/2022   Lab Results  Component Value Date   KPAFRELGTCHN 32.2 (H) 06/15/2022   LAMBDASER 21.0 06/15/2022   KAPLAMBRATIO 1.53 06/15/2022    RADIOGRAPHIC STUDIES: I have personally reviewed the radiological images as listed and agreed with the findings in the report. No results found.  ASSESSMENT & PLAN Joshua [name] 73 y.o. male with medical history significant for pancytopenia who presents for a follow up visit.   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  He does not have any nutritional deficiencies or other clear reasons for his macrocytic anemia and pancytopenia.  Typically this precludes development of morphological abnormalities within the bone marrow.  Given that none of his levels are critically low I would recommend observation at this time.  We can see the patient back in  approximately 6 months to reevaluate.  The patient voiced understanding of our findings and was willing and able to proceed with observation at this time.  #Pancytopenia, Suspect Myelodysplasia -- Bone marrow biopsy did not show any clear evidence of abnormality, however no other etiology has been found for his macrocytic anemia and pancytopenia.  This may represent brewing myelodysplasia -- Findings may represent ICUS, consistent with  idiopathic cytopenias of undetermined significance. -- Labs today show white blood cell count *** -- Recommend return to clinic in 12 months time to reevaluate.  If counts were to fall recommend earlier follow-up.  No orders of the defined types were placed in this encounter.   All questions were answered. The patient knows to call the clinic with any problems, questions or concerns.  A total of more than 25 minutes were spent on this encounter with face-to-face time and non-face-to-face time, including preparing to see the patient, ordering tests and/or medications, counseling the patient and coordination of care as outlined above.   Rogerio Clay, MD Department of Hematology/Oncology [facility] Phone: [phone] Pager: [phone] Email: [email or web address]  03/16/2024 10:54 PM

## 2024-03-17 ENCOUNTER — Inpatient Hospital Stay: Payer: Medicare HMO | Attending: Hematology and Oncology

## 2024-03-17 ENCOUNTER — Inpatient Hospital Stay: Payer: Medicare HMO | Admitting: Hematology and Oncology

## 2024-03-17 VITALS — BP 141/70 | HR 61 | Temp 97.2°F | Resp 16 | Wt 234.0 lb

## 2024-03-17 DIAGNOSIS — D539 Nutritional anemia, unspecified: Secondary | ICD-10-CM | POA: Diagnosis not present

## 2024-03-17 DIAGNOSIS — D61818 Other pancytopenia: Secondary | ICD-10-CM | POA: Insufficient documentation

## 2024-03-17 DIAGNOSIS — D696 Thrombocytopenia, unspecified: Secondary | ICD-10-CM | POA: Diagnosis not present

## 2024-03-17 LAB — CBC WITH DIFFERENTIAL (CANCER CENTER ONLY)
Abs Immature Granulocytes: 0.01 10*3/uL (ref 0.00–0.07)
Basophils Absolute: 0 10*3/uL (ref 0.0–0.1)
Basophils Relative: 0 %
Eosinophils Absolute: 0.1 10*3/uL (ref 0.0–0.5)
Eosinophils Relative: 2 %
HCT: 31.7 % — ABNORMAL LOW (ref 39.0–52.0)
Hemoglobin: 10.4 g/dL — ABNORMAL LOW (ref 13.0–17.0)
Immature Granulocytes: 0 %
Lymphocytes Relative: 24 %
Lymphs Abs: 0.8 10*3/uL (ref 0.7–4.0)
MCH: 30.7 pg (ref 26.0–34.0)
MCHC: 32.8 g/dL (ref 30.0–36.0)
MCV: 93.5 fL (ref 80.0–100.0)
Monocytes Absolute: 0.2 10*3/uL (ref 0.1–1.0)
Monocytes Relative: 7 %
Neutro Abs: 2.3 10*3/uL (ref 1.7–7.7)
Neutrophils Relative %: 67 %
Platelet Count: 143 10*3/uL — ABNORMAL LOW (ref 150–400)
RBC: 3.39 MIL/uL — ABNORMAL LOW (ref 4.22–5.81)
RDW: 16.1 % — ABNORMAL HIGH (ref 11.5–15.5)
WBC Count: 3.4 10*3/uL — ABNORMAL LOW (ref 4.0–10.5)
nRBC: 0 % (ref 0.0–0.2)

## 2024-03-17 LAB — CMP (CANCER CENTER ONLY)
ALT: 11 U/L (ref 0–44)
AST: 26 U/L (ref 15–41)
Albumin: 3.7 g/dL (ref 3.5–5.0)
Alkaline Phosphatase: 30 U/L — ABNORMAL LOW (ref 38–126)
Anion gap: 7 (ref 5–15)
BUN: 58 mg/dL — ABNORMAL HIGH (ref 8–23)
CO2: 20 mmol/L — ABNORMAL LOW (ref 22–32)
Calcium: 8.7 mg/dL — ABNORMAL LOW (ref 8.9–10.3)
Chloride: 107 mmol/L (ref 98–111)
Creatinine: 1.94 mg/dL — ABNORMAL HIGH (ref 0.61–1.24)
GFR, Estimated: 35 mL/min — ABNORMAL LOW (ref >60)
Glucose, Bld: 104 mg/dL — ABNORMAL HIGH (ref 70–99)
Potassium: 4.3 mmol/L (ref 3.5–5.1)
Sodium: 134 mmol/L — ABNORMAL LOW (ref 135–145)
Total Bilirubin: 0.5 mg/dL (ref 0.0–1.2)
Total Protein: 6.4 g/dL — ABNORMAL LOW (ref 6.5–8.1)

## 2024-03-23 ENCOUNTER — Other Ambulatory Visit: Payer: Self-pay | Admitting: *Deleted

## 2024-03-23 NOTE — Progress Notes (Signed)
 Med list updated

## 2024-05-08 DIAGNOSIS — R338 Other retention of urine: Secondary | ICD-10-CM | POA: Diagnosis not present

## 2024-05-08 DIAGNOSIS — N312 Flaccid neuropathic bladder, not elsewhere classified: Secondary | ICD-10-CM | POA: Diagnosis not present

## 2024-05-08 DIAGNOSIS — N401 Enlarged prostate with lower urinary tract symptoms: Secondary | ICD-10-CM | POA: Diagnosis not present

## 2024-05-08 DIAGNOSIS — N281 Cyst of kidney, acquired: Secondary | ICD-10-CM | POA: Diagnosis not present

## 2024-06-11 ENCOUNTER — Other Ambulatory Visit: Payer: Self-pay | Admitting: Internal Medicine

## 2024-07-08 ENCOUNTER — Other Ambulatory Visit: Payer: Self-pay | Admitting: Internal Medicine

## 2024-07-31 DIAGNOSIS — D649 Anemia, unspecified: Secondary | ICD-10-CM | POA: Diagnosis not present

## 2024-08-01 DIAGNOSIS — N312 Flaccid neuropathic bladder, not elsewhere classified: Secondary | ICD-10-CM | POA: Diagnosis not present

## 2024-08-01 DIAGNOSIS — N401 Enlarged prostate with lower urinary tract symptoms: Secondary | ICD-10-CM | POA: Diagnosis not present

## 2024-08-07 DIAGNOSIS — Z1331 Encounter for screening for depression: Secondary | ICD-10-CM | POA: Diagnosis not present

## 2024-08-07 DIAGNOSIS — D61818 Other pancytopenia: Secondary | ICD-10-CM | POA: Diagnosis not present

## 2024-08-07 DIAGNOSIS — Z7901 Long term (current) use of anticoagulants: Secondary | ICD-10-CM | POA: Diagnosis not present

## 2024-08-07 DIAGNOSIS — I2510 Atherosclerotic heart disease of native coronary artery without angina pectoris: Secondary | ICD-10-CM | POA: Diagnosis not present

## 2024-08-07 DIAGNOSIS — R82998 Other abnormal findings in urine: Secondary | ICD-10-CM | POA: Diagnosis not present

## 2024-08-07 DIAGNOSIS — M961 Postlaminectomy syndrome, not elsewhere classified: Secondary | ICD-10-CM | POA: Diagnosis not present

## 2024-08-07 DIAGNOSIS — I480 Paroxysmal atrial fibrillation: Secondary | ICD-10-CM | POA: Diagnosis not present

## 2024-08-07 DIAGNOSIS — N1832 Chronic kidney disease, stage 3b: Secondary | ICD-10-CM | POA: Diagnosis not present

## 2024-08-07 DIAGNOSIS — I10 Essential (primary) hypertension: Secondary | ICD-10-CM | POA: Diagnosis not present

## 2024-08-07 DIAGNOSIS — E1151 Type 2 diabetes mellitus with diabetic peripheral angiopathy without gangrene: Secondary | ICD-10-CM | POA: Diagnosis not present

## 2024-08-07 DIAGNOSIS — E785 Hyperlipidemia, unspecified: Secondary | ICD-10-CM | POA: Diagnosis not present

## 2024-08-07 DIAGNOSIS — Z0000 Encounter for general adult medical examination without abnormal findings: Secondary | ICD-10-CM | POA: Diagnosis not present

## 2024-08-07 DIAGNOSIS — I129 Hypertensive chronic kidney disease with stage 1 through stage 4 chronic kidney disease, or unspecified chronic kidney disease: Secondary | ICD-10-CM | POA: Diagnosis not present

## 2024-08-07 DIAGNOSIS — G4700 Insomnia, unspecified: Secondary | ICD-10-CM | POA: Diagnosis not present

## 2024-08-07 DIAGNOSIS — E039 Hypothyroidism, unspecified: Secondary | ICD-10-CM | POA: Diagnosis not present

## 2024-08-07 DIAGNOSIS — I739 Peripheral vascular disease, unspecified: Secondary | ICD-10-CM | POA: Diagnosis not present

## 2024-08-07 DIAGNOSIS — Z1339 Encounter for screening examination for other mental health and behavioral disorders: Secondary | ICD-10-CM | POA: Diagnosis not present

## 2024-08-21 ENCOUNTER — Telehealth: Payer: Self-pay | Admitting: Internal Medicine

## 2024-08-21 NOTE — Telephone Encounter (Signed)
*  STAT* If patient is at the pharmacy, call can be transferred to refill team.   1. Which medications need to be refilled? (please list name of each medication and dose if known)   carvedilol  (COREG ) 12.5 MG tablet     2. Would you like to learn more about the convenience, safety, & potential cost savings by using the [facility]? No   3. Are you open to using the [facility] (Type [facility]. ) No   4. Which pharmacy/location (including street and city if local pharmacy) is medication to be sent to?  [id number] - [place], [place] - [address] AT [address] [facility]   5. Do they need a 30 day or 90 day supply? 30 day  Pt has scheduled appt on 12/31

## 2024-08-22 MED ORDER — CARVEDILOL 12.5 MG PO TABS: 12.5000 mg | ORAL_TABLET | Freq: Two times a day (BID) | ORAL | 1 refills | Status: DC

## 2024-08-22 NOTE — Telephone Encounter (Signed)
 Pt scheduled 09/27/24, refill sent.

## 2024-08-28 DIAGNOSIS — N312 Flaccid neuropathic bladder, not elsewhere classified: Secondary | ICD-10-CM | POA: Diagnosis not present

## 2024-08-28 DIAGNOSIS — N401 Enlarged prostate with lower urinary tract symptoms: Secondary | ICD-10-CM | POA: Diagnosis not present

## 2024-08-28 DIAGNOSIS — R339 Retention of urine, unspecified: Secondary | ICD-10-CM | POA: Diagnosis not present

## 2024-09-27 ENCOUNTER — Encounter: Payer: Self-pay | Admitting: Student

## 2024-09-27 ENCOUNTER — Ambulatory Visit: Admitting: Student

## 2024-09-27 VITALS — BP 176/88 | HR 55 | Ht 68.0 in | Wt 256.0 lb

## 2024-09-27 DIAGNOSIS — I480 Paroxysmal atrial fibrillation: Secondary | ICD-10-CM

## 2024-09-27 DIAGNOSIS — I10 Essential (primary) hypertension: Secondary | ICD-10-CM

## 2024-09-27 DIAGNOSIS — I2510 Atherosclerotic heart disease of native coronary artery without angina pectoris: Secondary | ICD-10-CM

## 2024-09-27 DIAGNOSIS — I2583 Coronary atherosclerosis due to lipid rich plaque: Secondary | ICD-10-CM | POA: Diagnosis not present

## 2024-09-27 NOTE — Patient Instructions (Addendum)
 Medication Instructions:  No medication changes today. *If you need a refill on your cardiac medications before your next appointment, please call your pharmacy*  Lab Work: No labwork ordered today. If you have labs (blood work) drawn today and your tests are completely normal, you will receive your results only by: MyChart Message (if you have MyChart) OR A paper copy in the mail If you have any lab test that is abnormal or we need to change your treatment, we will call you to review the results.  Testing/Procedures: No testing ordered today  Follow-Up: At [facility], you and your health needs are our priority.  As part of our continuing mission to provide you with exceptional heart care, our providers are all part of one team.  This team includes your primary Cardiologist (physician) and Advanced Practice Providers or APPs (Physician Assistants and Nurse Practitioners) who all work together to provide you with the care you need, when you need it.  Your next appointment:   12 month(s) with Electrophysiology  6 months with General Cardiology to re-establish  Provider:   You may see Eulas FORBES Furbish, MD or one of the following Advanced Practice Providers on your designated Care Team:   Charlies Arthur, PA-C Mande Auvil Andy Sultana Tierney, PA-C Suzann Riddle, NP Daphne Barrack, NP    We recommend signing up for the patient portal called MyChart.  Sign up information is provided on this After Visit Summary.  MyChart is used to connect with patients for Virtual Visits (Telemedicine).  Patients are able to view lab/test results, encounter notes, upcoming appointments, etc.  Non-urgent messages can be sent to your provider as well.   To learn more about what you can do with MyChart, go to [email or web address].

## 2024-09-27 NOTE — Progress Notes (Signed)
" °  Electrophysiology Office Note:   Date:  09/27/2024  ID:  Joshua [name], DOB 10/09/1950, MRN [id number]  Primary Cardiologist: None Electrophysiologist: Eulas FORBES Furbish, MD   Electrophysiologist:  Eulas FORBES Furbish, MD      History of Present Illness:   Joshua [name] is a 73 y.o. male with h/o SVT (remotely ablated), AFib, CAD (PCI 2009), DM, HTN, HLD seen today for routine electrophysiology followup.   Since last being seen in our clinic the patient reports doing well from a cardiac perspective. No currently complaints. Overall,  he denies chest pain, palpitations, dyspnea, PND, orthopnea, nausea, vomiting, dizziness, syncope, edema, weight gain, or early satiety.   Review of systems complete and found to be negative unless listed in HPI.   EP Information / Studies Reviewed:    EKG is ordered today. Personal review as below.  EKG Interpretation Date/Time:  Wednesday September 27 2024 10:45:08 EST Ventricular Rate:  54 PR Interval:    QRS Duration:  88 QT Interval:  450 QTC Calculation: 426 R Axis:   24  Text Interpretation: Sinus bradycardia with a premature atrial contraction Low amplitude p waves Confirmed by Lesia Sharper [phone]) on 09/27/2024 10:52:38 AM    Arrhythmia/Device History No specialty comments available.   Physical Exam:   VS:  BP (!) 176/88   Pulse (!) 55   Ht 5' 8 (1.727 m)   Wt 256 lb (116.1 kg)   SpO2 95%   BMI 38.92 kg/m    Wt Readings from Last 3 Encounters:  09/27/24 256 lb (116.1 kg)  03/17/24 234 lb (106.1 kg)  11/12/23 214 lb 15.2 oz (97.5 kg)     GEN: No acute distress NECK: No JVD; No carotid bruits CARDIAC: Regular rate and rhythm, no murmurs, rubs, gallops RESPIRATORY:  Clear to auscultation without rales, wheezing or rhonchi  ABDOMEN: Soft, non-tender, non-distended EXTREMITIES:  No edema; No deformity   ASSESSMENT AND PLAN:    Paroxysmal AF SVT s/p ablation EKG today shows sinus bradycardia with a PAC. No recurrence by  symptoms Continue eliquis  5 mg BID for CHA2DS2/VASc of at least 5. Surveillance labs up to date and no other criteria for dose adjustment of eliquis .   HTN Stable on current regimen   CAD s/p PCI No s/s of ischemia.         Follow up with EP Team in 12 months.   Re-establish with gen cards given history of PCI and stents.  Signed, Sharper Prentice Lesia, PA-C  "

## 2024-10-12 ENCOUNTER — Other Ambulatory Visit: Payer: Self-pay | Admitting: Internal Medicine

## 2024-10-12 NOTE — Telephone Encounter (Signed)
 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 Creatine labs are out of Range  In accordance with refill protocols, please review and address the following requirements before this medication refill can be authorized:  Labs

## 2024-10-16 ENCOUNTER — Encounter: Payer: Self-pay | Admitting: *Deleted

## 2025-03-19 ENCOUNTER — Ambulatory Visit: Admitting: Hematology and Oncology

## 2025-03-19 ENCOUNTER — Other Ambulatory Visit
